# Patient Record
Sex: Female | Born: 1935 | Race: White | Hispanic: No | Marital: Married | Smoking: Former smoker
Health system: Southern US, Community
[De-identification: ages and names within clinical notes are randomized; demographics above are authoritative.]

## PROBLEM LIST (undated history)

## (undated) DIAGNOSIS — E785 Hyperlipidemia, unspecified: Secondary | ICD-10-CM

## (undated) DIAGNOSIS — J449 Chronic obstructive pulmonary disease, unspecified: Secondary | ICD-10-CM

## (undated) DIAGNOSIS — F329 Major depressive disorder, single episode, unspecified: Secondary | ICD-10-CM

## (undated) DIAGNOSIS — F32A Depression, unspecified: Secondary | ICD-10-CM

## (undated) DIAGNOSIS — D126 Benign neoplasm of colon, unspecified: Secondary | ICD-10-CM

## (undated) DIAGNOSIS — J189 Pneumonia, unspecified organism: Secondary | ICD-10-CM

## (undated) DIAGNOSIS — K5790 Diverticulosis of intestine, part unspecified, without perforation or abscess without bleeding: Secondary | ICD-10-CM

## (undated) DIAGNOSIS — K589 Irritable bowel syndrome without diarrhea: Secondary | ICD-10-CM

## (undated) DIAGNOSIS — M810 Age-related osteoporosis without current pathological fracture: Secondary | ICD-10-CM

## (undated) DIAGNOSIS — N200 Calculus of kidney: Secondary | ICD-10-CM

## (undated) DIAGNOSIS — I10 Essential (primary) hypertension: Secondary | ICD-10-CM

## (undated) DIAGNOSIS — K449 Diaphragmatic hernia without obstruction or gangrene: Secondary | ICD-10-CM

## (undated) DIAGNOSIS — K219 Gastro-esophageal reflux disease without esophagitis: Secondary | ICD-10-CM

## (undated) DIAGNOSIS — F419 Anxiety disorder, unspecified: Secondary | ICD-10-CM

## (undated) DIAGNOSIS — C3490 Malignant neoplasm of unspecified part of unspecified bronchus or lung: Secondary | ICD-10-CM

## (undated) DIAGNOSIS — M1990 Unspecified osteoarthritis, unspecified site: Secondary | ICD-10-CM

## (undated) DIAGNOSIS — I350 Nonrheumatic aortic (valve) stenosis: Secondary | ICD-10-CM

## (undated) DIAGNOSIS — J439 Emphysema, unspecified: Secondary | ICD-10-CM

## (undated) DIAGNOSIS — R011 Cardiac murmur, unspecified: Secondary | ICD-10-CM

## (undated) DIAGNOSIS — N189 Chronic kidney disease, unspecified: Secondary | ICD-10-CM

## (undated) HISTORY — DX: Irritable bowel syndrome, unspecified: K58.9

## (undated) HISTORY — DX: Cardiac murmur, unspecified: R01.1

## (undated) HISTORY — PX: CARDIAC CATHETERIZATION: SHX172

## (undated) HISTORY — PX: ABDOMINAL HYSTERECTOMY: SHX81

## (undated) HISTORY — PX: NOSE SURGERY: SHX723

## (undated) HISTORY — DX: Chronic obstructive pulmonary disease, unspecified: J44.9

## (undated) HISTORY — DX: Nonrheumatic aortic (valve) stenosis: I35.0

## (undated) HISTORY — DX: Hyperlipidemia, unspecified: E78.5

## (undated) HISTORY — PX: CATARACT EXTRACTION: SUR2

## (undated) HISTORY — DX: Age-related osteoporosis without current pathological fracture: M81.0

## (undated) HISTORY — PX: FACIAL COSMETIC SURGERY: SHX629

## (undated) HISTORY — PX: CARDIAC VALVE REPLACEMENT: SHX585

## (undated) HISTORY — DX: Diaphragmatic hernia without obstruction or gangrene: K44.9

## (undated) HISTORY — DX: Diverticulosis of intestine, part unspecified, without perforation or abscess without bleeding: K57.90

## (undated) HISTORY — DX: Chronic kidney disease, unspecified: N18.9

## (undated) HISTORY — DX: Calculus of kidney: N20.0

## (undated) HISTORY — DX: Emphysema, unspecified: J43.9

## (undated) HISTORY — PX: CORONARY ANGIOPLASTY: SHX604

## (undated) HISTORY — DX: Malignant neoplasm of unspecified part of unspecified bronchus or lung: C34.90

---
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
## 1990-09-18 DIAGNOSIS — D126 Benign neoplasm of colon, unspecified: Secondary | ICD-10-CM

## 1990-09-18 HISTORY — DX: Benign neoplasm of colon, unspecified: D12.6

## 1996-09-18 HISTORY — PX: ANKLE SURGERY: SHX546

## 1999-07-20 ENCOUNTER — Other Ambulatory Visit: Admission: RE | Admit: 1999-07-20 | Discharge: 1999-07-20 | Payer: Self-pay | Admitting: Family Medicine

## 1999-08-08 ENCOUNTER — Ambulatory Visit (HOSPITAL_COMMUNITY): Admission: RE | Admit: 1999-08-08 | Discharge: 1999-08-08 | Payer: Self-pay | Admitting: Ophthalmology

## 1999-12-02 ENCOUNTER — Emergency Department (HOSPITAL_COMMUNITY): Admission: EM | Admit: 1999-12-02 | Discharge: 1999-12-02 | Payer: Self-pay | Admitting: Emergency Medicine

## 1999-12-03 ENCOUNTER — Encounter: Payer: Self-pay | Admitting: Emergency Medicine

## 2000-09-28 ENCOUNTER — Other Ambulatory Visit: Admission: RE | Admit: 2000-09-28 | Discharge: 2000-09-28 | Payer: Self-pay | Admitting: Family Medicine

## 2001-01-08 ENCOUNTER — Encounter: Admission: RE | Admit: 2001-01-08 | Discharge: 2001-04-08 | Payer: Self-pay | Admitting: Family Medicine

## 2001-12-31 ENCOUNTER — Other Ambulatory Visit: Admission: RE | Admit: 2001-12-31 | Discharge: 2001-12-31 | Payer: Self-pay | Admitting: Family Medicine

## 2003-05-05 ENCOUNTER — Other Ambulatory Visit: Admission: RE | Admit: 2003-05-05 | Discharge: 2003-05-05 | Payer: Self-pay | Admitting: Family Medicine

## 2004-01-24 ENCOUNTER — Emergency Department (HOSPITAL_COMMUNITY): Admission: EM | Admit: 2004-01-24 | Discharge: 2004-01-24 | Payer: Self-pay | Admitting: Family Medicine

## 2004-05-11 ENCOUNTER — Other Ambulatory Visit: Admission: RE | Admit: 2004-05-11 | Discharge: 2004-05-11 | Payer: Self-pay | Admitting: Family Medicine

## 2005-05-12 ENCOUNTER — Other Ambulatory Visit: Admission: RE | Admit: 2005-05-12 | Discharge: 2005-05-12 | Payer: Self-pay | Admitting: Family Medicine

## 2006-05-14 ENCOUNTER — Other Ambulatory Visit: Admission: RE | Admit: 2006-05-14 | Discharge: 2006-05-14 | Payer: Self-pay | Admitting: Family Medicine

## 2006-08-07 ENCOUNTER — Ambulatory Visit: Payer: Self-pay | Admitting: Gastroenterology

## 2006-08-20 ENCOUNTER — Ambulatory Visit: Payer: Self-pay | Admitting: Gastroenterology

## 2008-08-20 ENCOUNTER — Other Ambulatory Visit: Admission: RE | Admit: 2008-08-20 | Discharge: 2008-08-20 | Payer: Self-pay | Admitting: Family Medicine

## 2011-06-13 ENCOUNTER — Other Ambulatory Visit: Payer: Self-pay

## 2011-06-13 ENCOUNTER — Encounter (HOSPITAL_COMMUNITY)
Admission: RE | Admit: 2011-06-13 | Discharge: 2011-06-13 | Disposition: A | Discharge status: Home / Self Care | Payer: Medicare Other | Source: Ambulatory Visit | Attending: Obstetrics & Gynecology | Admitting: Obstetrics & Gynecology

## 2011-06-13 ENCOUNTER — Encounter (HOSPITAL_COMMUNITY): Payer: Self-pay

## 2011-06-13 HISTORY — DX: Essential (primary) hypertension: I10

## 2011-06-13 HISTORY — DX: Anxiety disorder, unspecified: F41.9

## 2011-06-13 HISTORY — DX: Unspecified osteoarthritis, unspecified site: M19.90

## 2011-06-13 HISTORY — DX: Gastro-esophageal reflux disease without esophagitis: K21.9

## 2011-06-13 HISTORY — DX: Depression, unspecified: F32.A

## 2011-06-13 HISTORY — DX: Major depressive disorder, single episode, unspecified: F32.9

## 2011-06-13 LAB — BASIC METABOLIC PANEL
Calcium: 10.1 mg/dL (ref 8.4–10.5)
GFR calc Af Amer: 47 mL/min — ABNORMAL LOW (ref >60)
GFR calc non Af Amer: 38 mL/min — ABNORMAL LOW (ref >60)
Glucose, Bld: 147 mg/dL — ABNORMAL HIGH (ref 70–99)
Sodium: 136 mEq/L (ref 135–145)

## 2011-06-13 LAB — CBC
MCH: 27.2 pg (ref 26.0–34.0)
RBC: 4.52 MIL/uL (ref 3.87–5.11)

## 2011-06-13 LAB — SURGICAL PCR SCREEN: Staphylococcus aureus: NEGATIVE

## 2011-06-13 NOTE — Patient Instructions (Signed)
   Your procedure is scheduled on:Wed, 06/21/11  Enter through the Main Entrance of [facility] at:6am Pick up the phone at the desk and dial [phone] and inform us of your arrival  Please call this number if you have any problems the morning of surgery: [phone]  Remember: Do not eat food after midnight  Do not drink clear liquids after:midnite Take these medicines the morning of surgery with a SIP OF WATER: BP meds Do not take Metformin the night before  Do not wear jewelry, make-up, or FINGER nail polish Do not wear lotions, powders, or perfumes.  You may wear deodorant. Do not shave 48 hours prior to surgery. Do not bring valuables to the hospital. Leave suitcase in the car. After Surgery it may be brought to your room. For patients being admitted to the hospital, checkout time is 11:00am the day of discharge.  Patients discharged on the day of surgery will not be allowed to drive home.   Name and phone number of your driver:[name]- [phone]   Remember to use your hibiclens as instructed.Please shower with 1/2 bottle the evening before your surgery and the other 1/2 bottle the morning of surgery.

## 2011-06-21 ENCOUNTER — Encounter (HOSPITAL_COMMUNITY): Payer: Self-pay | Admitting: Anesthesiology

## 2011-06-21 ENCOUNTER — Encounter (HOSPITAL_COMMUNITY): Payer: Self-pay | Admitting: *Deleted

## 2011-06-21 ENCOUNTER — Inpatient Hospital Stay (HOSPITAL_COMMUNITY)
Admission: RE | Admit: 2011-06-21 | Discharge: 2011-06-23 | DRG: 743 | Disposition: A | Discharge status: Home / Self Care | Payer: Medicare Other | Source: Ambulatory Visit | Attending: Obstetrics & Gynecology | Admitting: Obstetrics & Gynecology

## 2011-06-21 ENCOUNTER — Encounter (HOSPITAL_COMMUNITY)
Admission: RE | Disposition: A | Discharge status: Home / Self Care | Payer: Self-pay | Source: Ambulatory Visit | Attending: Obstetrics & Gynecology

## 2011-06-21 ENCOUNTER — Other Ambulatory Visit: Payer: Self-pay | Admitting: Obstetrics & Gynecology

## 2011-06-21 ENCOUNTER — Inpatient Hospital Stay (HOSPITAL_COMMUNITY): Payer: Medicare Other | Admitting: Anesthesiology

## 2011-06-21 DIAGNOSIS — E119 Type 2 diabetes mellitus without complications: Secondary | ICD-10-CM | POA: Diagnosis present

## 2011-06-21 DIAGNOSIS — Z01812 Encounter for preprocedural laboratory examination: Secondary | ICD-10-CM

## 2011-06-21 DIAGNOSIS — N812 Incomplete uterovaginal prolapse: Principal | ICD-10-CM | POA: Diagnosis present

## 2011-06-21 DIAGNOSIS — I10 Essential (primary) hypertension: Secondary | ICD-10-CM | POA: Diagnosis present

## 2011-06-21 DIAGNOSIS — Z90710 Acquired absence of both cervix and uterus: Secondary | ICD-10-CM

## 2011-06-21 DIAGNOSIS — E7800 Pure hypercholesterolemia, unspecified: Secondary | ICD-10-CM | POA: Diagnosis present

## 2011-06-21 DIAGNOSIS — N8000 Endometriosis of the uterus, unspecified: Secondary | ICD-10-CM | POA: Diagnosis present

## 2011-06-21 DIAGNOSIS — N838 Other noninflammatory disorders of ovary, fallopian tube and broad ligament: Secondary | ICD-10-CM | POA: Diagnosis present

## 2011-06-21 DIAGNOSIS — J438 Other emphysema: Secondary | ICD-10-CM | POA: Diagnosis present

## 2011-06-21 DIAGNOSIS — Z01818 Encounter for other preprocedural examination: Secondary | ICD-10-CM

## 2011-06-21 HISTORY — PX: ANTERIOR AND POSTERIOR REPAIR: SHX5121

## 2011-06-21 HISTORY — PX: LAPAROSCOPIC ASSISTED VAGINAL HYSTERECTOMY: SHX5398

## 2011-06-21 HISTORY — PX: VAGINAL PROLAPSE REPAIR: SHX830

## 2011-06-21 HISTORY — PX: SALPINGOOPHORECTOMY: SHX82

## 2011-06-21 LAB — GLUCOSE, CAPILLARY

## 2011-06-21 LAB — COMPREHENSIVE METABOLIC PANEL
ALT: 17 U/L (ref 0–35)
AST: 20 U/L (ref 0–37)
Alkaline Phosphatase: 70 U/L (ref 39–117)
CO2: 26 mEq/L (ref 19–32)
Calcium: 9.7 mg/dL (ref 8.4–10.5)
Chloride: 101 mEq/L (ref 96–112)
GFR calc Af Amer: 44 mL/min — ABNORMAL LOW (ref >90)
GFR calc non Af Amer: 38 mL/min — ABNORMAL LOW (ref >90)
Glucose, Bld: 157 mg/dL — ABNORMAL HIGH (ref 70–99)
Sodium: 137 mEq/L (ref 135–145)
Total Bilirubin: 0.3 mg/dL (ref 0.3–1.2)

## 2011-06-21 SURGERY — HYSTERECTOMY, VAGINAL, LAPAROSCOPY-ASSISTED
Anesthesia: General | Site: Vagina | Wound class: Contaminated

## 2011-06-21 MED ORDER — ZOLPIDEM TARTRATE 5 MG PO TABS: 5.0000 mg | ORAL_TABLET | Freq: Every evening | ORAL | Status: DC | PRN

## 2011-06-21 MED ORDER — ROCURONIUM BROMIDE 50 MG/5ML IV SOLN
INTRAVENOUS | Status: AC
  Filled 2011-06-21: qty 1

## 2011-06-21 MED ORDER — DOCUSATE SODIUM 100 MG PO CAPS
100.0000 mg | ORAL_CAPSULE | Freq: Every day | ORAL | Status: DC
  Administered 2011-06-22: 100 mg via ORAL
  Filled 2011-06-21: qty 1

## 2011-06-21 MED ORDER — LIDOCAINE HCL (CARDIAC) 20 MG/ML IV SOLN
INTRAVENOUS | Status: DC | PRN
  Administered 2011-06-21: 60 mg via INTRAVENOUS

## 2011-06-21 MED ORDER — SODIUM CHLORIDE 0.9 % IJ SOLN: 9.0000 mL | INTRAMUSCULAR | Status: DC | PRN

## 2011-06-21 MED ORDER — MAGNESIUM CITRATE PO SOLN
296.0000 mL | Freq: Every day | ORAL | Status: DC | PRN
  Filled 2011-06-21: qty 296

## 2011-06-21 MED ORDER — HYDROMORPHONE HCL 1 MG/ML IJ SOLN: 0.2000 mg | INTRAMUSCULAR | Status: DC | PRN

## 2011-06-21 MED ORDER — NEOSTIGMINE METHYLSULFATE 1 MG/ML IJ SOLN
INTRAMUSCULAR | Status: AC
  Filled 2011-06-21: qty 10

## 2011-06-21 MED ORDER — ONDANSETRON HCL 4 MG/2ML IJ SOLN
4.0000 mg | Freq: Four times a day (QID) | INTRAMUSCULAR | Status: DC | PRN
  Administered 2011-06-22: 4 mg via INTRAVENOUS

## 2011-06-21 MED ORDER — PANTOPRAZOLE SODIUM 40 MG PO TBEC
40.0000 mg | DELAYED_RELEASE_TABLET | Freq: Every day | ORAL | Status: DC
  Administered 2011-06-21 – 2011-06-22 (×2): 40 mg via ORAL
  Filled 2011-06-21 (×4): qty 1

## 2011-06-21 MED ORDER — CEFAZOLIN SODIUM 1-5 GM-% IV SOLN
1.0000 g | INTRAVENOUS | Status: AC
  Administered 2011-06-21: 1 g via INTRAVENOUS

## 2011-06-21 MED ORDER — ONDANSETRON HCL 4 MG/2ML IJ SOLN
4.0000 mg | Freq: Four times a day (QID) | INTRAMUSCULAR | Status: DC | PRN
  Filled 2011-06-21: qty 2

## 2011-06-21 MED ORDER — BISACODYL 10 MG RE SUPP: 10.0000 mg | Freq: Every day | RECTAL | Status: DC | PRN

## 2011-06-21 MED ORDER — SENNOSIDES-DOCUSATE SODIUM 8.6-50 MG PO TABS: 2.0000 | ORAL_TABLET | Freq: Every day | ORAL | Status: DC | PRN

## 2011-06-21 MED ORDER — GLYCOPYRROLATE 0.2 MG/ML IJ SOLN
INTRAMUSCULAR | Status: AC
  Filled 2011-06-21: qty 2

## 2011-06-21 MED ORDER — SIMVASTATIN 20 MG PO TABS
20.0000 mg | ORAL_TABLET | Freq: Every day | ORAL | Status: DC
  Administered 2011-06-21 – 2011-06-22 (×2): 20 mg via ORAL
  Filled 2011-06-21 (×3): qty 1

## 2011-06-21 MED ORDER — FUROSEMIDE 10 MG/ML IJ SOLN
INTRAMUSCULAR | Status: AC
  Administered 2011-06-21: 10 mg via INTRAVENOUS
  Filled 2011-06-21: qty 2

## 2011-06-21 MED ORDER — OLMESARTAN MEDOXOMIL 40 MG PO TABS
40.0000 mg | ORAL_TABLET | Freq: Every day | ORAL | Status: DC
  Administered 2011-06-22 – 2011-06-23 (×2): 40 mg via ORAL
  Filled 2011-06-21 (×3): qty 1

## 2011-06-21 MED ORDER — PANTOPRAZOLE SODIUM 40 MG PO TBEC
DELAYED_RELEASE_TABLET | ORAL | Status: AC
  Filled 2011-06-21: qty 1

## 2011-06-21 MED ORDER — SERTRALINE HCL 50 MG PO TABS
50.0000 mg | ORAL_TABLET | Freq: Every day | ORAL | Status: DC
  Administered 2011-06-22 – 2011-06-23 (×2): 50 mg via ORAL
  Filled 2011-06-21 (×4): qty 1

## 2011-06-21 MED ORDER — ALUM & MAG HYDROXIDE-SIMETH 200-200-20 MG/5ML PO SUSP: 30.0000 mL | ORAL | Status: DC | PRN

## 2011-06-21 MED ORDER — FENTANYL CITRATE 0.05 MG/ML IJ SOLN
INTRAMUSCULAR | Status: AC
  Filled 2011-06-21: qty 5

## 2011-06-21 MED ORDER — CEFAZOLIN SODIUM 1-5 GM-% IV SOLN
INTRAVENOUS | Status: AC
  Filled 2011-06-21: qty 50

## 2011-06-21 MED ORDER — MIDAZOLAM HCL 5 MG/5ML IJ SOLN
INTRAMUSCULAR | Status: DC | PRN
  Administered 2011-06-21: 2 mg via INTRAVENOUS

## 2011-06-21 MED ORDER — GLYCOPYRROLATE 0.2 MG/ML IJ SOLN
INTRAMUSCULAR | Status: DC | PRN
  Administered 2011-06-21: .4 mg via INTRAVENOUS

## 2011-06-21 MED ORDER — BUPIVACAINE HCL (PF) 0.25 % IJ SOLN
INTRAMUSCULAR | Status: DC | PRN
  Administered 2011-06-21: 20 mL
  Administered 2011-06-21: 10 mL

## 2011-06-21 MED ORDER — ROCURONIUM BROMIDE 100 MG/10ML IV SOLN
INTRAVENOUS | Status: DC | PRN
  Administered 2011-06-21: 30 mg via INTRAVENOUS

## 2011-06-21 MED ORDER — PROPOFOL 10 MG/ML IV EMUL
INTRAVENOUS | Status: AC
  Filled 2011-06-21: qty 20

## 2011-06-21 MED ORDER — MAGNESIUM HYDROXIDE 400 MG/5ML PO SUSP: 30.0000 mL | Freq: Every day | ORAL | Status: DC | PRN

## 2011-06-21 MED ORDER — AMLODIPINE BESYLATE 5 MG PO TABS
5.0000 mg | ORAL_TABLET | Freq: Every day | ORAL | Status: DC
  Administered 2011-06-22 – 2011-06-23 (×2): 5 mg via ORAL
  Filled 2011-06-21 (×4): qty 1

## 2011-06-21 MED ORDER — MIDAZOLAM HCL 2 MG/2ML IJ SOLN
INTRAMUSCULAR | Status: AC
  Filled 2011-06-21: qty 2

## 2011-06-21 MED ORDER — SODIUM CHLORIDE 0.45 % IV SOLN
INTRAVENOUS | Status: DC
  Administered 2011-06-21 – 2011-06-22 (×3): via INTRAVENOUS

## 2011-06-21 MED ORDER — MORPHINE SULFATE (PF) 1 MG/ML IV SOLN
INTRAVENOUS | Status: DC
  Filled 2011-06-21: qty 25

## 2011-06-21 MED ORDER — METHOCARBAMOL 500 MG PO TABS
500.0000 mg | ORAL_TABLET | Freq: Three times a day (TID) | ORAL | Status: DC | PRN
  Administered 2011-06-21: 500 mg via ORAL
  Filled 2011-06-21: qty 1

## 2011-06-21 MED ORDER — NALOXONE HCL 0.4 MG/ML IJ SOLN: 0.4000 mg | INTRAMUSCULAR | Status: DC | PRN

## 2011-06-21 MED ORDER — INDIGOTINDISULFONATE SODIUM 8 MG/ML IJ SOLN
INTRAMUSCULAR | Status: DC | PRN
  Administered 2011-06-21: 5 mL via INTRAVENOUS

## 2011-06-21 MED ORDER — DEXAMETHASONE SODIUM PHOSPHATE 10 MG/ML IJ SOLN
INTRAMUSCULAR | Status: DC | PRN
  Administered 2011-06-21: 10 mg via INTRAVENOUS

## 2011-06-21 MED ORDER — IBUPROFEN 600 MG PO TABS
600.0000 mg | ORAL_TABLET | Freq: Four times a day (QID) | ORAL | Status: DC | PRN
  Administered 2011-06-21 – 2011-06-22 (×2): 600 mg via ORAL
  Filled 2011-06-21 (×2): qty 1

## 2011-06-21 MED ORDER — THERA M PLUS PO TABS
1.0000 | ORAL_TABLET | Freq: Every day | ORAL | Status: DC
  Administered 2011-06-22 – 2011-06-23 (×2): 1 via ORAL
  Filled 2011-06-21 (×4): qty 1

## 2011-06-21 MED ORDER — NEOSTIGMINE METHYLSULFATE 1 MG/ML IJ SOLN
INTRAMUSCULAR | Status: DC | PRN
  Administered 2011-06-21: 2 mg via INTRAVENOUS

## 2011-06-21 MED ORDER — LACTATED RINGERS IR SOLN
Status: DC | PRN
  Administered 2011-06-21: 3000 mL

## 2011-06-21 MED ORDER — ONDANSETRON HCL 4 MG PO TABS: 4.0000 mg | ORAL_TABLET | Freq: Four times a day (QID) | ORAL | Status: DC | PRN

## 2011-06-21 MED ORDER — HYDROMORPHONE HCL 1 MG/ML IJ SOLN
INTRAMUSCULAR | Status: AC
  Administered 2011-06-21: 0.5 mg via INTRAVENOUS
  Filled 2011-06-21: qty 1

## 2011-06-21 MED ORDER — ONDANSETRON HCL 4 MG/2ML IJ SOLN
INTRAMUSCULAR | Status: AC
  Filled 2011-06-21: qty 2

## 2011-06-21 MED ORDER — DEXAMETHASONE SODIUM PHOSPHATE 10 MG/ML IJ SOLN
INTRAMUSCULAR | Status: AC
  Filled 2011-06-21: qty 1

## 2011-06-21 MED ORDER — METFORMIN HCL ER 500 MG PO TB24
1000.0000 mg | ORAL_TABLET | ORAL | Status: DC
  Administered 2011-06-21: 1000 mg via ORAL
  Filled 2011-06-21: qty 2

## 2011-06-21 MED ORDER — HYDROMORPHONE HCL 1 MG/ML IJ SOLN
0.2500 mg | INTRAMUSCULAR | Status: DC | PRN
  Administered 2011-06-21 (×4): 0.5 mg via INTRAVENOUS

## 2011-06-21 MED ORDER — BISACODYL 5 MG PO TBEC
5.0000 mg | DELAYED_RELEASE_TABLET | Freq: Every day | ORAL | Status: DC | PRN
  Filled 2011-06-21: qty 1

## 2011-06-21 MED ORDER — HYDROCHLOROTHIAZIDE 12.5 MG PO CAPS
12.5000 mg | ORAL_CAPSULE | ORAL | Status: DC
  Administered 2011-06-22 – 2011-06-23 (×2): 12.5 mg via ORAL
  Filled 2011-06-21 (×3): qty 1

## 2011-06-21 MED ORDER — ONDANSETRON HCL 4 MG/2ML IJ SOLN
INTRAMUSCULAR | Status: DC | PRN
  Administered 2011-06-21: 4 mg via INTRAVENOUS

## 2011-06-21 MED ORDER — FUROSEMIDE 10 MG/ML IJ SOLN
10.0000 mg | Freq: Once | INTRAMUSCULAR | Status: AC
  Administered 2011-06-21: 10 mg via INTRAVENOUS

## 2011-06-21 MED ORDER — INDIGOTINDISULFONATE SODIUM 8 MG/ML IJ SOLN
INTRAMUSCULAR | Status: AC
  Filled 2011-06-21: qty 5

## 2011-06-21 MED ORDER — PANTOPRAZOLE SODIUM 40 MG PO TBEC
40.0000 mg | DELAYED_RELEASE_TABLET | Freq: Once | ORAL | Status: AC
  Administered 2011-06-21: 40 mg via ORAL

## 2011-06-21 MED ORDER — FENTANYL CITRATE 0.05 MG/ML IJ SOLN
INTRAMUSCULAR | Status: DC | PRN
  Administered 2011-06-21: 50 ug via INTRAVENOUS
  Administered 2011-06-21 (×2): 100 ug via INTRAVENOUS

## 2011-06-21 MED ORDER — DIPHENHYDRAMINE HCL 50 MG/ML IJ SOLN: 12.5000 mg | Freq: Four times a day (QID) | INTRAMUSCULAR | Status: DC | PRN

## 2011-06-21 MED ORDER — SIMETHICONE 80 MG PO CHEW: 80.0000 mg | CHEWABLE_TABLET | Freq: Four times a day (QID) | ORAL | Status: DC | PRN

## 2011-06-21 MED ORDER — PROPOFOL 10 MG/ML IV EMUL
INTRAVENOUS | Status: DC | PRN
  Administered 2011-06-21: 50 mg via INTRAVENOUS
  Administered 2011-06-21: 110 mg via INTRAVENOUS

## 2011-06-21 MED ORDER — PHENAZOPYRIDINE HCL 200 MG PO TABS: 200.0000 mg | ORAL_TABLET | Freq: Three times a day (TID) | ORAL | Status: DC

## 2011-06-21 MED ORDER — DIPHENHYDRAMINE HCL 12.5 MG/5ML PO ELIX
12.5000 mg | ORAL_SOLUTION | Freq: Four times a day (QID) | ORAL | Status: DC | PRN
  Filled 2011-06-21: qty 5

## 2011-06-21 MED ORDER — FUROSEMIDE 10 MG/ML IJ SOLN
INTRAMUSCULAR | Status: AC
  Filled 2011-06-21: qty 2

## 2011-06-21 MED ORDER — LIDOCAINE HCL (CARDIAC) 20 MG/ML IV SOLN
INTRAVENOUS | Status: AC
  Filled 2011-06-21: qty 5

## 2011-06-21 MED ORDER — OXYCODONE-ACETAMINOPHEN 5-325 MG PO TABS
1.0000 | ORAL_TABLET | ORAL | Status: DC | PRN
  Administered 2011-06-21 – 2011-06-22 (×4): 1 via ORAL
  Filled 2011-06-21 (×4): qty 1

## 2011-06-21 MED ORDER — LACTATED RINGERS IV SOLN
INTRAVENOUS | Status: DC
  Administered 2011-06-21 (×3): via INTRAVENOUS
  Administered 2011-06-21: 1000 mL via INTRAVENOUS

## 2011-06-21 SURGICAL SUPPLY — 56 items
BLADE SURG 15 STRL LF C SS BP (BLADE) ×3 IMPLANT
BLADE SURG 15 STRL SS (BLADE) ×4
CABLE HIGH FREQUENCY MONO STRZ (ELECTRODE) IMPLANT
CANISTER SUCTION 2500CC (MISCELLANEOUS) ×4 IMPLANT
CATH BONANNO SUPRAPUBIC 14G (CATHETERS) ×1 IMPLANT
CATH ROBINSON RED A/P 16FR (CATHETERS) ×4 IMPLANT
CLOTH BEACON ORANGE TIMEOUT ST (SAFETY) ×4 IMPLANT
CONT PATH 16OZ SNAP LID 3702 (MISCELLANEOUS) ×4 IMPLANT
COVER TABLE BACK 60X90 (DRAPES) ×4 IMPLANT
DECANTER SPIKE VIAL GLASS SM (MISCELLANEOUS) ×1 IMPLANT
DEVICE CAPIO SUTURING (INSTRUMENTS) ×1
DEVICE CAPIO SUTURING OPC (INSTRUMENTS) IMPLANT
DRAPE UTILITY XL STRL (DRAPES) ×4 IMPLANT
DRESSING COVERLET 3X1 FLEXIBLE (GAUZE/BANDAGES/DRESSINGS) ×1 IMPLANT
ELECT LIGASURE LONG (ELECTRODE) ×1 IMPLANT
ELECT REM PT RETURN 9FT ADLT (ELECTROSURGICAL) ×4
ELECTRODE REM PT RTRN 9FT ADLT (ELECTROSURGICAL) ×3 IMPLANT
GAUZE PACKING IODOFORM 2 (PACKING) IMPLANT
GLOVE BIO SURGEON STRL SZ7.5 (GLOVE) ×12 IMPLANT
GLOVE BIOGEL PI IND STRL 6.5 (GLOVE) ×3 IMPLANT
GLOVE BIOGEL PI INDICATOR 6.5 (GLOVE) ×1
GOWN PREVENTION PLUS XLARGE (GOWN DISPOSABLE) ×4 IMPLANT
NDL INSUFFLATION 14GA 120MM (NEEDLE) ×3 IMPLANT
NDL SPNL 22GX3.5 QUINCKE BK (NEEDLE) IMPLANT
NEEDLE HYPO 22GX1.5 SAFETY (NEEDLE) ×1 IMPLANT
NEEDLE INSUFFLATION 14GA 120MM (NEEDLE) ×4 IMPLANT
NEEDLE MAYO .5 CIRCLE (NEEDLE) ×1 IMPLANT
NEEDLE SPNL 22GX3.5 QUINCKE BK (NEEDLE) ×4 IMPLANT
NS IRRIG 1000ML POUR BTL (IV SOLUTION) ×4 IMPLANT
PACK LAVH (CUSTOM PROCEDURE TRAY) ×4 IMPLANT
PACK VAGINAL WOMENS (CUSTOM PROCEDURE TRAY) ×4 IMPLANT
SEALER TISSUE G2 CVD JAW 35 (ENDOMECHANICALS) IMPLANT
SEALER TISSUE G2 CVD JAW 45CM (ENDOMECHANICALS) ×4 IMPLANT
SET IRRIG TUBING LAPAROSCOPIC (IRRIGATION / IRRIGATOR) ×1 IMPLANT
SPONGE GAUZE 4X4 12PLY (GAUZE/BANDAGES/DRESSINGS) ×1 IMPLANT
STRIP CLOSURE SKIN 1/4X3 (GAUZE/BANDAGES/DRESSINGS) ×5 IMPLANT
STRIP CLOSURE SKIN 1/4X4 (GAUZE/BANDAGES/DRESSINGS) ×1 IMPLANT
SUT CAPIO POLYGLYCOLIC (SUTURE) ×2 IMPLANT
SUT CHROMIC 2 0 SH (SUTURE) ×1 IMPLANT
SUT MNCRL 0 MO-4 VIOLET 18 CR (SUTURE) ×9 IMPLANT
SUT MNCRL 0 VIOLET 6X18 (SUTURE) ×3 IMPLANT
SUT MON AB 2-0 CT1 27 (SUTURE) ×8 IMPLANT
SUT MON AB 2-0 SH 27 (SUTURE)
SUT MON AB 2-0 SH27 (SUTURE) IMPLANT
SUT MONOCRYL 0 6X18 (SUTURE) ×1
SUT MONOCRYL 0 MO 4 18  CR/8 (SUTURE) ×3
SUT SILK 2 0 FSL 18 (SUTURE) ×1 IMPLANT
SUT VIC AB 3-0 PS2 18 (SUTURE) ×4
SUT VIC AB 3-0 PS2 18XBRD (SUTURE) ×3 IMPLANT
TAPE CLOTH SURG 4X10 WHT LF (GAUZE/BANDAGES/DRESSINGS) ×1 IMPLANT
TOWEL OR 17X24 6PK STRL BLUE (TOWEL DISPOSABLE) ×8 IMPLANT
TRAY FOLEY CATH 14FR (SET/KITS/TRAYS/PACK) ×4 IMPLANT
TROCAR Z-THREAD BLADED 11X100M (TROCAR) ×4 IMPLANT
TROCAR Z-THREAD BLADED 5X100MM (TROCAR) ×4 IMPLANT
WARMER LAPAROSCOPE (MISCELLANEOUS) ×3 IMPLANT
WATER STERILE IRR 1000ML POUR (IV SOLUTION) ×4 IMPLANT

## 2011-06-21 NOTE — Anesthesia Postprocedure Evaluation (Signed)
  Anesthesia Post-op Note  Patient: [name]  Procedure(s) Performed:  LAPAROSCOPIC ASSISTED VAGINAL HYSTERECTOMY; SALPINGO OOPHERECTOMY; ANTERIOR (CYSTOCELE) AND POSTERIOR REPAIR (RECTOCELE); SACROPEXY - sacrospinous ligament suspension Patient is awake and responsive, she has no complaints. Pain and nausea are reasonably well controlled. Vital signs are stable and clinically acceptable ,rales are still present on Right base. Dr Jennette Kettle aware patient has received Lasix. She has had 350cc u/o in PACU. Oxygen saturation is clinically acceptable; d/c with O2 There are no apparent anesthetic complications at this time. Patient is ready for discharge.

## 2011-06-21 NOTE — Anesthesia Postprocedure Evaluation (Signed)
  Anesthesia Post-op Note  Patient: [name]  Procedure(s) Performed:  LAPAROSCOPIC ASSISTED VAGINAL HYSTERECTOMY; SALPINGO OOPHERECTOMY; ANTERIOR (CYSTOCELE) AND POSTERIOR REPAIR (RECTOCELE); SACROPEXY - sacrospinous ligament suspension  Patient is awake and responsive. Pain and nausea are reasonably well controlled. Vital signs are stable and clinically acceptable. Oxygen saturation is clinically acceptable. There are no apparent anesthetic complications at this time. Patient is ready for discharge.

## 2011-06-21 NOTE — Progress Notes (Signed)
CTSP re: SP catheter  Pt unable to void, feels need to void Cath will not drain Will not flush SP cath removed intact Foley cath placed

## 2011-06-21 NOTE — Transfer of Care (Signed)
Immediate Anesthesia Transfer of Care Note  Patient: [name]  Procedure(s) Performed:  LAPAROSCOPIC ASSISTED VAGINAL HYSTERECTOMY; SALPINGO OOPHERECTOMY; ANTERIOR (CYSTOCELE) AND POSTERIOR REPAIR (RECTOCELE); SACROPEXY - sacrospinous ligament suspension  Patient Location: PACU  Anesthesia Type: General  Level of Consciousness: awake, alert  and oriented  Airway & Oxygen Therapy: Patient Spontanous Breathing and Patient connected to face mask oxygen  Post-op Assessment: Report given to PACU RN and Post -op Vital signs reviewed and stable  Post vital signs: stable  Complications: No apparent anesthesia complications

## 2011-06-21 NOTE — Progress Notes (Signed)
Encounter addended by: Madison Hickman on: 06/21/2011  6:17 PM<BR>     Documentation filed: Notes Section

## 2011-06-21 NOTE — OR Nursing (Signed)
Procedure #4 Sacrospinous Ligament Suspension

## 2011-06-21 NOTE — Addendum Note (Signed)
Addendum  created 06/21/11 1047 by Jiles Garter   Modules edited:Anesthesia Events, Orders

## 2011-06-21 NOTE — Addendum Note (Signed)
Addendum  created 06/21/11 1047 by Jiles Garter   Modules edited:Anesthesia Events

## 2011-06-21 NOTE — Addendum Note (Signed)
Addendum  created 06/21/11 1208 by Jiles Garter   Modules edited:Notes Section

## 2011-06-21 NOTE — Anesthesia Preprocedure Evaluation (Signed)
Anesthesia Evaluation  Name, MR# and DOB Patient awake  General Assessment Comment  Reviewed: Allergy & Precautions, H&P , NPO status , Patient's Chart, lab work & pertinent test results  History of Anesthesia Complications (+) Emergence Delirium  Airway Mallampati: II TM Distance: >3 FB Neck ROM: Full    Dental No notable dental hx. (+) Lower Dentures and Upper Dentures   Pulmonary  clear to auscultation  Pulmonary exam normal       Cardiovascular hypertension, Pt. on medications Regular Normal    Neuro/Psych Negative Neurological ROS     GI/Hepatic Neg liver ROS  GERD Medicated and Controlled  Endo/Other    Renal/GU negative Renal ROS  Genitourinary negative   Musculoskeletal negative musculoskeletal ROS (+)   Abdominal Normal abdominal exam  (+)   Peds  Hematology negative hematology ROS (+)   Anesthesia Other Findings   Reproductive/Obstetrics negative OB ROS                           Anesthesia Physical Anesthesia Plan  ASA: II  Anesthesia Plan: General   Post-op Pain Management:    Induction: Intravenous  Airway Management Planned: Oral ETT  Additional Equipment:   Intra-op Plan:   Post-operative Plan: Extubation in OR  Informed Consent: I have reviewed the patients History and Physical, chart, labs and discussed the procedure including the risks, benefits and alternatives for the proposed anesthesia with the patient or authorized representative who has indicated his/her understanding and acceptance.   Dental advisory given  Plan Discussed with: Anesthesiologist and CRNA  Anesthesia Plan Comments:         Anesthesia Quick Evaluation

## 2011-06-21 NOTE — Op Note (Signed)
Pelvic relaxation and prolapse Procedure:  LAVH/BSO/A&P repair/SSLS Surgeon:  Jennette Kettle   AsstRana Snare GET EBL: 100cc complcations none Drains:  Suprapubic  Dictated

## 2011-06-21 NOTE — Anesthesia Procedure Notes (Signed)
Procedures

## 2011-06-21 NOTE — Anesthesia Postprocedure Evaluation (Signed)
  Anesthesia Post-op Note  Patient: [name]  Procedure(s) Performed:  LAPAROSCOPIC ASSISTED VAGINAL HYSTERECTOMY; SALPINGO OOPHERECTOMY; ANTERIOR (CYSTOCELE) AND POSTERIOR REPAIR (RECTOCELE); SACROPEXY - sacrospinous ligament suspension  Patient Location: [facility]  Anesthesia Type: General  Level of Consciousness: awake, alert  and oriented  Airway and Oxygen Therapy: Patient Spontanous Breathing  Post-op Pain: none  Post-op Assessment: Post-op Vital signs reviewed and Patient's Cardiovascular Status Stable  Post-op Vital Signs: Reviewed and stable  Complications: No apparent anesthesia complications

## 2011-06-22 ENCOUNTER — Inpatient Hospital Stay (HOSPITAL_COMMUNITY): Payer: Medicare Other

## 2011-06-22 ENCOUNTER — Encounter (HOSPITAL_COMMUNITY): Payer: Self-pay | Admitting: Obstetrics & Gynecology

## 2011-06-22 LAB — CBC
HCT: 29.5 % — ABNORMAL LOW (ref 36.0–46.0)
Hemoglobin: 9.4 g/dL — ABNORMAL LOW (ref 12.0–15.0)
MCH: 27.8 pg (ref 26.0–34.0)
MCV: 87.3 fL (ref 78.0–100.0)
RBC: 3.38 MIL/uL — ABNORMAL LOW (ref 3.87–5.11)

## 2011-06-22 MED ORDER — IPRATROPIUM BROMIDE 0.02 % IN SOLN
0.5000 mg | Freq: Three times a day (TID) | RESPIRATORY_TRACT | Status: DC
  Administered 2011-06-22: 0.5 mg via RESPIRATORY_TRACT
  Filled 2011-06-22 (×4): qty 2.5

## 2011-06-22 MED ORDER — SODIUM CHLORIDE 0.9 % IV SOLN
3.0000 g | Freq: Four times a day (QID) | INTRAVENOUS | Status: DC
  Administered 2011-06-22 – 2011-06-23 (×4): 3 g via INTRAVENOUS
  Filled 2011-06-22 (×7): qty 3

## 2011-06-22 MED ORDER — ALBUTEROL SULFATE (5 MG/ML) 0.5% IN NEBU
5.0000 mg | INHALATION_SOLUTION | Freq: Once | RESPIRATORY_TRACT | Status: AC
  Administered 2011-06-22: 5 mg via RESPIRATORY_TRACT
  Filled 2011-06-22: qty 20

## 2011-06-22 MED ORDER — METFORMIN HCL ER 500 MG PO TB24: 1000.0000 mg | ORAL_TABLET | ORAL | Status: DC

## 2011-06-22 MED ORDER — IOHEXOL 300 MG/ML  SOLN
73.0000 mL | Freq: Once | INTRAMUSCULAR | Status: AC | PRN
  Administered 2011-06-22: 73 mL via INTRAVENOUS

## 2011-06-22 MED ORDER — IPRATROPIUM BROMIDE 0.02 % IN SOLN
0.5000 mg | Freq: Once | RESPIRATORY_TRACT | Status: AC
  Administered 2011-06-22: 0.5 mg via RESPIRATORY_TRACT
  Filled 2011-06-22: qty 2.5

## 2011-06-22 MED ORDER — FLUTICASONE PROPIONATE HFA 110 MCG/ACT IN AERO
2.0000 | INHALATION_SPRAY | Freq: Two times a day (BID) | RESPIRATORY_TRACT | Status: DC
  Administered 2011-06-22 – 2011-06-23 (×2): 2 via RESPIRATORY_TRACT
  Filled 2011-06-22: qty 12

## 2011-06-22 MED ORDER — ALBUTEROL SULFATE (5 MG/ML) 0.5% IN NEBU
2.5000 mg | INHALATION_SOLUTION | Freq: Three times a day (TID) | RESPIRATORY_TRACT | Status: DC
  Administered 2011-06-22: 2.5 mg via RESPIRATORY_TRACT
  Filled 2011-06-22 (×4): qty 20

## 2011-06-22 NOTE — Progress Notes (Signed)
Pt. On continuous pulse ox. Nurse tech noted saturations had decreased to 79 percent on room air. O2 reapplied at 3.5 liters. Pt. Oxygen saturation returned to 95 percent after a few minutes. Pt. unlabored with regular rate and rhythm. Lung sounds diminished bilaterally. Will continue to monitor pt. Status.---------------------------------------Tonesha Tsou Michail Jewels, RN

## 2011-06-22 NOTE — Progress Notes (Signed)
UR Chart review completed.  

## 2011-06-22 NOTE — H&P (Signed)
NAMEVENIA, [name] NO.:  [id number]  MEDICAL RECORD NO.:  [id number]  LOCATION:  9304                          FACILITY:  [facility]  PHYSICIAN:  Freddy Finner, M.D.   DATE OF BIRTH:  1932/04/03  DATE OF ADMISSION:  06/21/2011 DATE OF DISCHARGE:                             HISTORY & PHYSICAL   HISTORY OF PRESENT ILLNESS:  The patient is a 77 year old white married female gravida 1, para 1 who presented in August 2012 complaining of difficulty with urination, hesitancy, and frequent urinary tract infections.  She was concerned about the possibility of vaginal prolapse.  On examination, at that time she did have a third-degree cystocele, second-degree rectocele, and marked uterine descensus.  At that time, she has been treated with Septra DS for urinary tract infection.  The patient has noted progressive increase in symptoms to the point that it is creating significant difficulty and discomfort. She has requested definitive surgery.  She was given and she reviewed a pelvic support pamphlet produced by the ACOG and other ACOG materials describing the procedure and the potential risks and benefits.  She has requested definitive surgery.  She is admitted now for laparoscopically- assisted vaginal hysterectomy, bilateral salpingo-oophorectomy, anterior and posterior vaginal repair, and sacrospinous ligament fixation of the upper vagina. She has no other major complaints at the present time, specifically no cardiopulmonary, GI, or other GU symptoms or problems.  PAST MEDICAL HISTORY:  The patient has an asthmatic condition.  She has irritable bowel syndrome.  She has recurrent urinary tract infections. She has arthritis, and she has adult-onset type 2 diabetes and hypertension.  CURRENT MEDICATIONS:  Diovan and hydrochlorothiazide 320/12.5, Norvasc 5- 10 mg per day.  She takes Prilosec for regurg symptoms, pravastatin for her cholesterol, metformin 1 g a day for  diabetes, and Zoloft 50 mg a day.  She has no known drug allergies.  She is not a cigarette smoker and does not use alcohol.  FAMILY HISTORY:  Noncontributory.  PHYSICAL EXAMINATION:  HEENT:  Grossly within normal limits. VITAL SIGNS:  Blood pressure in the office 160/90, weight 142. NECK:  Thyroid gland is not palpably enlarged to my examination. CHEST:  Clear to auscultation throughout. HEART:  Normal sinus rhythm without murmurs, rubs, or gallops. BREASTS:  Considered to be normal.  No palpable masses.  No skin change or nipple discharge. HEART:  Normal sinus rhythm without murmurs, rubs, or gallops. ABDOMEN:  Soft and nontender.  No appreciable organomegaly or palpable masses. PELVIC:  Findings are described above as the admitting diagnosis.  Her bimanual exam reveals no palpable enlargement of the uterus or ovaries. The rectum is palpably normal and confirms the bimanual findings. EXTREMITIES:  Without cyanosis, clubbing, or edema.  ASSESSMENT:  Symptomatic pelvic relaxation with cystocele, rectocele, and uterine descensus.  PLAN:  Laparoscopic-assisted vaginal hysterectomy, bilateral salpingo- oophorectomy, anterior and posterior vaginal repair, sacrospinous ligament fixation of vaginal apex.     Freddy Finner, M.D.     WRN/MEDQ  D:  06/22/2011  T:  06/22/2011  Job:  [id number]

## 2011-06-22 NOTE — Op Note (Signed)
[name], [name]                ACCOUNT NO.:  [id number]  MEDICAL RECORD NO.:  [id number]  LOCATION:  9304                          FACILITY:  [facility]  PHYSICIAN:  Freddy Finner, M.D.   DATE OF BIRTH:  [birth year]  DATE OF PROCEDURE:  06/21/2011 DATE OF DISCHARGE:                              OPERATIVE REPORT   PREOPERATIVE DIAGNOSES:  Symptomatic pelvic relaxation with second- degree cystocele, first-degree rectocele, uterine descensus.  POSTOPERATIVE DIAGNOSES:  Symptomatic pelvic relaxation with second- degree cystocele, first-degree rectocele, uterine descensus.  OPERATIVE PROCEDURES:  Laparoscopically-assisted vaginal hysterectomy, bilateral salpingo-oophorectomy, anterior and posterior vaginal repair, sacrospinous ligament fixation of vaginal apex.  SURGEON:  Freddy Finner, MD  ANESTHESIA:  General endotracheal.  SURGICAL ASSISTANT:  Dineen Kid. Rana Snare, MD  ESTIMATED INTRAOPERATIVE BLOOD LOSS:  100-150 mL.  INTRAOPERATIVE COMPLICATIONS:  None.  SECONDARY PROCEDURE:  Placement of a suprapubic catheter.  DETAILS OF THE PRESENT ILLNESS RECORDED IN THE ADMISSION NOTE:  The patient was admitted on the morning of surgery.  She was given a bolus of Ancef IV preoperatively.  She was placed in PAS hose.  She was brought to the operating room and placed under adequate general endotracheal anesthesia and placed in the dorsal lithotomy position using Allen stirrup system.  Betadine scrub followed by Betadine solution was used to prep the abdomen, perineum and vagina.  Bladder was evacuated with sterile technique using a Robinson catheter.  Hulka tenaculum was attached to the cervix under direct visualization. Sterile drapes were applied.  Two small incisions were made in the abdomen and one at the umbilicus and one just above the symphysis.  A disposable Veress needle was introduced through the anterior abdominal wall through the upper incision while elevating the anterior  abdominal wall manually.  Pneumoperitoneum was allowed to accumulate with carbon dioxide gas.  An 11-mm bladed disposable trocar was then placed while elevating the anterior abdominal wall and entry into the abdomen was accomplished without evidence of injury on entry.  Pneumoperitoneum was further accumulated with carbon dioxide gas and a 5-mm trocar placed in the lower incision which was just above the symphysis.  Systematic scanning inspection of the abdomen was carried out as well as the pelvis.  There were no apparent abnormalities in the pelvis.  The appendix was visualized and was normal.  There were some filmy adhesions not creating a blind loop in the right lower quadrant above the pelvic brim.  The liver was not visualized and no apparent abnormalities were noted in the upper abdomen.  Using a spring-loaded grasping forceps for traction and exposure and using the Ethicon trio device through the operating channel of the laparoscope, the right infundibulopelvic ligament was progressively sealed and divided.  The dissection was carried through the round ligament and through the upper portion of the broad ligament to the level of the uterine artery.  Each side was treated identically.  Attention was then turned vaginally.  Posterior weighted vaginal retractor was placed.  A Jacobs tenaculum was attached to the cervix after removing the Hulka tenaculum.  Deaver retractors were used anteriorly and laterally for exposure.  Colpotomy incision was made posterior to the cervix while tenting the  mucosa and the cul-de-sac and the cul-de-sac entered.  The long blade narrow posterior retractor was then placed.  Cervix was circumscribed with a scalpel to release the mucosa.  The LigaSure device was then used to seal and divide the uterosacral pedicles and the bladder pillars.  Bladder was further advanced off the cervix.  Please note, the patient was given IV indigo carmine and starting the  dissection to help identify bladder were to recur.  Throughout the case, Normodyne was visualized in the operative field.  After securing the cardinal ligament pedicles with the LigaSure device, the anterior peritoneum was entered.  Vessel pedicles were taken, sealed and divided with LigaSure also and this allowed delivery of the uterus, tubes and ovaries through the vaginal introitus.  Angles of the vagina were then anchored to uterosacrals and mattress sutures of 0 Monocryl.  Uterosacrals were plicated and posterior peritoneum closed with interrupted 0 Monocryl suture.  Posterior half of the cuff was closed vertically with figure-of-eights of 0 Monocryl.  Anterior mucosa was grasped on either side and the mucosa overlying the bladder tented in the midline.  A progressive dissection was carried out using blunt and sharp dissection.  An incision was made along the anterior vaginal wall for approximately 6-7 cm.  With careful sharp and blunt dissection, the vascular or the urogenital fascia was freed from the mucosa.  Using plication sutures of 0 Monocryl, the urogenital fascia was reapproximated in the midline to elevate and support the cystocele. Segments of mucosa were excised from the anterior vaginal wall on either side.  The vaginal cuff was then completely closed with figure-of-eights of 0 Monocryl.  The remaining incision was closed in the running 2-0 Monocryl suture.  Attention was then turned posteriorly.  The fourchette was grasped on either side with Allis clamps and an horizontal incision made along the perineal body.  The mucosa overlying the rectum was then treated and essentially identical manner to the anterior vaginal wall with dissection in the rectum and perirectal tissues away from the mucosa with blunt and sharp dissection.  After the dissection, the right sacrospinous ligament was identified and using a Capio device into the absorbable suture.  This suture was  anchored in the sacrospinous ligament and attached to the vaginal mucosa just above the level of the incision.  Plication sutures were used to plicate the perirectal structures in the midline to recreate the rectovaginal septum.  The levators were approximated in interrupted 0 Monocryl suture.  The mucosal closure was then started using a 2-0 Monocryl and carried for a distance approximately 3-4 cm.  Sacrospinous suture was tied and markedly elevated at the vaginal apex into an appropriate anatomical location.  The closure was completed using the 2-0 Monocryl in a fashion similar to episiotomy repair.  Hemostasis was adequate and for that reason packing was not placed.  The bladder was then filled using a Foley catheter and irrigating solution.  Suprapubic banana catheter was placed in the bladder without difficulty and entered into the skin with silk sutures.  Reinspection laparoscopically was carried out and the shunt system was used.  Irrigation of the pelvis was carried out. Hemostasis was complete.  Irrigating solution was aspirated from the abdomen.  Gas was allowed to escape.  Skin incision was closed with interrupted subcuticular sutures of 3-0 Vicryl and were anesthetized with 0.25% Marcaine.  Steri-Strips were also applied to the lower incision and sterile dressing to the umbilical incision.  The patient was awakened and taken to the  recovery room in good condition.     Freddy Finner, M.D.     WRN/MEDQ  D:  06/21/2011  T:  06/22/2011  Job:  [id number]

## 2011-06-23 LAB — GLUCOSE, CAPILLARY: Glucose-Capillary: 102 mg/dL — ABNORMAL HIGH (ref 70–99)

## 2011-06-23 MED ORDER — SODIUM CHLORIDE 0.45 % IV SOLN: INTRAVENOUS | Status: DC

## 2011-06-23 MED ORDER — OXYCODONE-ACETAMINOPHEN 5-325 MG PO TABS: 1.0000 | ORAL_TABLET | ORAL | Status: AC | PRN

## 2011-06-23 NOTE — Progress Notes (Signed)
2 Days Post-Op Procedure(s): LAPAROSCOPIC ASSISTED VAGINAL HYSTERECTOMY SALPINGO OOPHERECTOMY ANTERIOR (CYSTOCELE) AND POSTERIOR REPAIR (RECTOCELE) SACROPEXY  Subjective: Patient reports incisional pain, tolerating PO and no problems voiding.    Objective: I have reviewed patient's vital signs, intake and output and medications.  General: alert abd is soft, nontender and non distended  Assessment: s/p Procedure(s): LAPAROSCOPIC ASSISTED VAGINAL HYSTERECTOMY SALPINGO OOPHERECTOMY ANTERIOR (CYSTOCELE) AND POSTERIOR REPAIR (RECTOCELE) SACROPEXY: progressing well and tolerating diet  Plan: Discharge home rx percocet followup with dr. Jennette Kettle  LOS: 2 days    Carzell Saldivar RONALD 06/23/2011, 1:59 PM

## 2011-06-23 NOTE — Discharge Summary (Signed)
Admission Diagnosis: Pelvic Relaxation  Discharge Diagnosis: Same  Hospital Course: On day of admission patient underwent LAVH BSO anterior repair posterior repair and sacrospinous ligament fixation. Post op course uncomplicated. She went home in good condition on POD #2 . Discharged with po percocet. No driving for 1 week. No sex for 6 weeks. Advised to call for temp > 100.5 , nausea, bleeding or severe abdominal pain. Followup with Dr. Jennette Kettle

## 2011-06-23 NOTE — H&P (Signed)
[name], [name]                ACCOUNT NO.:  [id number]  MEDICAL RECORD NO.:  [id number]  LOCATION:  9304                          FACILITY:  [facility]  PHYSICIAN:  Freddy Finner, M.D.   DATE OF BIRTH:  [birth year]  DATE OF ADMISSION:  06/21/2011 DATE OF DISCHARGE:                             HISTORY & PHYSICAL   This is the first postoperative day for [name] who during the day developed some hypoxia with rest.  This required nasal cannula oxygen to maintain an O2 sat of 90% or so.  She dropped into the high 70s while sleeping without oxygen.  Additional workup today included a chest x-ray which was suggestive of pneumonia.  Also, it was noted that she has emphysema.  A spiral CT of the chest was also obtained which identified some bibasilar atelectasis and moderate emphysema.  Subjectively, the patient was comfortable and in no acute distress.  Her CBC today showed a white count of 10.7.  She remained afebrile with vital signs stable throughout the day and on the evening rounds, was alert, oriented, and states that this is her usual situation.  In addition, she did receive respiratory therapy with albuterol.  On the evening rounds, it was elected to discontinue this and to resume her inhaler which she routinely uses; Flomax, I think, with 2 puffs twice a day.  The patient lost her suprapubic catheter on the evening of surgery and now has a Foley in place.  She is making adequate urine output after receiving some intravenous Lasix because of the wet rales heard by the Anesthesia Department on their postoperative rounds.  Her condition is considered to be satisfactory.  Further plans were outlined in the hospital chart orders.     Freddy Finner, M.D.     WRN/MEDQ  D:  06/22/2011  T:  06/23/2011  Job:  [id number]

## 2011-06-24 NOTE — Discharge Summary (Signed)
[name], [name]                ACCOUNT NO.:  [id number]  MEDICAL RECORD NO.:  [id number]  LOCATION:  9304                          FACILITY:  [facility]  PHYSICIAN:  Freddy Finner, M.D.   DATE OF BIRTH:  November 23, 1931  DATE OF ADMISSION:  06/21/2011 DATE OF DISCHARGE:  06/23/2011                              DISCHARGE SUMMARY   DISCHARGE DIAGNOSES:  GYN diagnosis of third-degree cystocele, second- degree rectocele, marked uterine descensus, clinical symptoms of pelvic relaxation without urinary incontinence.  SECONDARY DIAGNOSES:  Include numerous medical conditions outlined in the present illness, including emphysema, diabetes type 2, hypertension, hypercholesterolemia.  OPERATIVE PROCEDURES:  Laparoscopic-assisted vaginal hysterectomy, bilateral salpingo-oophorectomy, anterior approach and posterior vaginal repair, sacrospinous ligament fixation of vaginal apex.  INTRAOPERATIVE COMPLICATIONS:  None.  POSTOPERATIVE COMPLICATIONS:  Essentially none, although her initial postoperative course was remarkable for desaturation and resting and under the postoperative effects of anesthesia.  Based on this, a chest x- ray and a spiral CT of the chest were obtained, and the conclusion eventually was that this was simply baseline findings for her based on her chronic obstructive pulmonary disease.  There were no other postoperative complications.  DISPOSITION:  The patient was in satisfactory improved condition.  On the second postoperative day, she was voiding with essentially complete emptying of her bladder as confirmed by ultrasound.  She was having adequate bladder function.  She was subjectively in good condition and baseline for her respiratory status.  She was ambulating without assistance, tolerating a regular diet.  She is discharged home to resume all of her preoperative medications, which are outlined carefully already in the chart.  She is to avoid vaginal entry or heavy  lifting. She is to have progressively increasing physical activity, but ambulating regularly as often as possible.  She is to report fever or heavy bleeding.  Details of the present illness recorded in the admission note.  Briefly, the patient presented complaining of pelvic relaxation syndrome. Findings on physical exam were essentially those of the postoperative diagnosis and the patient has a systolic murmur, which is somewhat blowing.  There were fine rales at her bases.  Laboratory data is recorded in the laboratory section of the chart.  X- ray findings also recorded and noted as presented above.  HOSPITAL COURSE:  The patient was admitted on the morning of surgery. She was given a bolus of Ancef.  She was placed in PAS hose.  She was brought to the operating room, placed under adequate general endotracheal anesthesia where the above-described surgical procedure was accomplished with a minimal intraoperative blood loss and no intraoperative complications.  Postoperatively, the only significant event is noted above in the discharge instructions.  The patient was noted to desaturate while sleeping or resting.  She was placed on nasal cannula.  She had x-ray evaluation as noted above.  There was a comment of the potential for aspiration pneumonia, but clinically, this did not fit since she was completely afebrile and without pulmonary complaints. She was treated with respiratory therapy treatments and treated with Unasyn during the first and second postoperative days.  By the second postoperative day, she was voiding with essentially complete emptying of her  bladder.  She was ambulating without assistance.  She was taking her regular diet.  She was discharged home on Percocet 5/325 to be taken 1 or 2 every 4 hours as needed for pain.  She is to resume all of her preoperative medication.  She is to return to the hospital for severe pain or heavy vaginal bleeding.  She is to call for  fever.  She is to return to the office in approximately 1 week for postoperative followup.     Freddy Finner, M.D.     WRN/MEDQ  D:  06/23/2011  T:  06/24/2011  Job:  [id number]

## 2011-09-29 ENCOUNTER — Encounter: Payer: Self-pay | Admitting: Gastroenterology

## 2011-10-10 ENCOUNTER — Encounter: Payer: Self-pay | Admitting: Gastroenterology

## 2011-10-26 ENCOUNTER — Encounter: Payer: Self-pay | Admitting: Pulmonary Disease

## 2011-10-26 ENCOUNTER — Ambulatory Visit (INDEPENDENT_AMBULATORY_CARE_PROVIDER_SITE_OTHER): Payer: Medicare Other | Admitting: Pulmonary Disease

## 2011-10-26 VITALS — BP 142/68 | HR 87 | Temp 97.7°F | Ht 61.0 in | Wt 137.4 lb

## 2011-10-26 DIAGNOSIS — R0989 Other specified symptoms and signs involving the circulatory and respiratory systems: Secondary | ICD-10-CM

## 2011-10-26 DIAGNOSIS — J449 Chronic obstructive pulmonary disease, unspecified: Secondary | ICD-10-CM | POA: Insufficient documentation

## 2011-10-26 NOTE — Patient Instructions (Addendum)
Will set up for breathing studies, and see you back same day for review. Stop symbicort, and will give you an albuterol inhaler to have for emergencies only.  Can take 2 inhalations up to every 6hrs if needed.

## 2011-10-26 NOTE — Progress Notes (Signed)
  Subjective:    Patient ID: [name], female    DOB: [birth year], 78 y.o.   MRN: [id number]  HPI The patient is a 78 year old female who I've been asked to see for dyspnea.  The patient states that she has had breathing problems for years, and has been told that she is a "shallow breather".  She has never been told or diagnosed with asthma.  She states that she can walk long distances on flat ground at a moderate pace, but will give winded with elevations such as hills or one flight of stairs.  She will get short of breath bringing groceries in from the car, but not with light housework.  She does have a history of smoking for 15 years, and had a chest CT last year that clearly had emphysematous changes.  She recently had spirometry with her primary care physician which did suggest airflow obstruction, however the patient had an excellent shunt time of only 4-5 seconds.  I have explained to her we like to see 6-8 seconds minimum.  The patient was started on symbicort, but really has not noticed an improvement in her breathing.  She has had an increased cough since being on the medication.  The patient denies any chronic lower extremity edema, and to her knowledge has never had any cardiac issues.  She has not had a recent cardiac evaluation.  The patient states that her weight has been stable.   Review of Systems  Constitutional: Negative for fever and unexpected weight change.  HENT: Positive for congestion and sneezing. Negative for ear pain, nosebleeds, sore throat, rhinorrhea, trouble swallowing, dental problem, postnasal drip and sinus pressure.   Eyes: Negative for redness and itching.  Respiratory: Positive for cough and shortness of breath. Negative for chest tightness and wheezing.   Cardiovascular: Negative for palpitations and leg swelling.  Gastrointestinal: Negative for nausea and vomiting.  Genitourinary: Negative for dysuria.  Musculoskeletal: Negative for joint swelling.  Skin:  Negative for rash.  Neurological: Negative for headaches.  Hematological: Does not bruise/bleed easily.  Psychiatric/Behavioral: Positive for dysphoric mood. The patient is not nervous/anxious.        Objective:   Physical Exam Constitutional:  Well developed, no acute distress  HENT:  Nares patent without discharge  Oropharynx without exudate, palate and uvula are normal  Eyes:  Perrla, eomi, no scleral icterus  Neck:  No JVD, no TMG  Cardiovascular:  Normal rate, regular rhythm, no rubs or gallops.  No murmurs        Intact distal pulses  Pulmonary :  Normal breath sounds, no stridor or respiratory distress   No rales, rhonchi, or wheezing  Abdominal:  Soft, nondistended, bowel sounds present.  No tenderness noted.   Musculoskeletal:  No lower extremity edema noted.  Lymph Nodes:  No cervical lymphadenopathy noted  Skin:  No cyanosis noted  Neurologic:  Alert, appropriate, moves all 4 extremities without obvious deficit.         Assessment & Plan:

## 2011-10-26 NOTE — Assessment & Plan Note (Signed)
The patient clearly has emphysematous changes on a chest CT last year, but it is unclear whether she has clinically significant airflow obstruction on PFTs.  She has been tried on symbicort, and really did not see the difference.  However, she is only been using it once a day because of increased cough.  At this point, I would like to schedule for full pulmonary function studies to get a more accurate assessment of her lung function.  If she does indeed have significant airflow obstruction, May try a different inhaler regimen that may have less issues with cough.  If she does not have significant airflow obstruction, would consider a cardiac evaluation for completeness if she has not had one recently.

## 2011-11-10 ENCOUNTER — Other Ambulatory Visit: Payer: Medicare Other

## 2011-11-10 ENCOUNTER — Ambulatory Visit (HOSPITAL_COMMUNITY)
Admission: RE | Admit: 2011-11-10 | Discharge: 2011-11-10 | Disposition: A | Discharge status: Home / Self Care | Payer: Medicare Other | Source: Ambulatory Visit | Attending: Pulmonary Disease | Admitting: Pulmonary Disease

## 2011-11-10 ENCOUNTER — Encounter: Payer: Self-pay | Admitting: Pulmonary Disease

## 2011-11-10 ENCOUNTER — Ambulatory Visit (INDEPENDENT_AMBULATORY_CARE_PROVIDER_SITE_OTHER): Payer: Medicare Other | Admitting: Pulmonary Disease

## 2011-11-10 VITALS — BP 134/70 | HR 78 | Temp 98.1°F | Ht 61.0 in | Wt 135.0 lb

## 2011-11-10 DIAGNOSIS — R0989 Other specified symptoms and signs involving the circulatory and respiratory systems: Secondary | ICD-10-CM | POA: Insufficient documentation

## 2011-11-10 DIAGNOSIS — R062 Wheezing: Secondary | ICD-10-CM | POA: Insufficient documentation

## 2011-11-10 DIAGNOSIS — J988 Other specified respiratory disorders: Secondary | ICD-10-CM | POA: Insufficient documentation

## 2011-11-10 DIAGNOSIS — Z79899 Other long term (current) drug therapy: Secondary | ICD-10-CM | POA: Insufficient documentation

## 2011-11-10 DIAGNOSIS — R0609 Other forms of dyspnea: Secondary | ICD-10-CM | POA: Insufficient documentation

## 2011-11-10 DIAGNOSIS — J449 Chronic obstructive pulmonary disease, unspecified: Secondary | ICD-10-CM

## 2011-11-10 LAB — PULMONARY FUNCTION TEST

## 2011-11-10 MED ORDER — ALBUTEROL SULFATE (5 MG/ML) 0.5% IN NEBU
2.5000 mg | INHALATION_SOLUTION | Freq: Once | RESPIRATORY_TRACT | Status: AC
  Administered 2011-11-10: 2.5 mg via RESPIRATORY_TRACT

## 2011-11-10 NOTE — Patient Instructions (Signed)
You have significant copd by your breathing studies.  This may be due to asthma, emphysema, or both Will start on dulera 100/5 2 puffs every am and pm everyday.  Rinse mouth well, gargle, and spit.   Start spiriva one inhalation each am.  Can use your albuterol for rescue only if needed.  Will send a blood test looking for a hereditary form of emphysema Please call me in 4 weeks with how things are going.   followup with me in 4mos.

## 2011-11-10 NOTE — Assessment & Plan Note (Signed)
The patient has moderate to severe airflow obstruction on her pulmonary function studies today.  I suspect there is a component of asthma here, but with her severe decrease in diffusion capacity, I think she also has some degree of emphysema.  I would like to start her on an aggressive bronchodilator regimen, and we'll use dulera since symbicort caused increased cough.  Will also add Spiriva as well.  Given her degree of airflow obstruction and very little smoking history in the past, will also check an alpha-1 antitrypsin level.

## 2011-11-10 NOTE — Progress Notes (Signed)
  Subjective:    Patient ID: [name], female    DOB: 23-Jan-1932, 78 y.o.   MRN: [id number]  HPI The patient comes in today for followup after her recent pulmonary function studies, ordered as part of a workup for dyspnea on exertion.  She was found to have moderate to severe airflow obstruction, but a 19% improvement in FEV1 with bronchodilator.  She had no restriction, but her diffusion capacity was 29% of predicted.  I have reviewed this study with her in detail, and answered all of her questions.   Review of Systems  Constitutional: Negative for fever and unexpected weight change.  HENT: Positive for congestion and sneezing. Negative for ear pain, nosebleeds, sore throat, rhinorrhea, trouble swallowing, dental problem, postnasal drip and sinus pressure.   Eyes: Negative for redness and itching.  Respiratory: Positive for shortness of breath. Negative for cough, chest tightness and wheezing.   Cardiovascular: Negative for palpitations and leg swelling.  Gastrointestinal: Negative for nausea and vomiting.  Genitourinary: Negative for dysuria.  Musculoskeletal: Negative for joint swelling.  Skin: Negative for rash.  Neurological: Positive for headaches.  Hematological: Does not bruise/bleed easily.  Psychiatric/Behavioral: Negative for dysphoric mood. The patient is not nervous/anxious.        Objective:   Physical Exam Well developed in nad Nose without purulence or discharge noted. Chest with mild decrease in bs LE without edema, no cyanosis  Alert and oriented, moves all 4.        Assessment & Plan:

## 2011-11-15 LAB — ALPHA-1 ANTITRYPSIN PHENOTYPE: A-1 Antitrypsin: 139 mg/dL (ref 83–199)

## 2011-11-24 ENCOUNTER — Encounter: Payer: Self-pay | Admitting: Gastroenterology

## 2011-11-24 ENCOUNTER — Ambulatory Visit (AMBULATORY_SURGERY_CENTER): Payer: Medicare Other | Admitting: *Deleted

## 2011-11-24 VITALS — Ht 61.0 in | Wt 135.0 lb

## 2011-11-24 DIAGNOSIS — Z1211 Encounter for screening for malignant neoplasm of colon: Secondary | ICD-10-CM

## 2011-11-24 MED ORDER — PEG-KCL-NACL-NASULF-NA ASC-C 100 G PO SOLR: ORAL | Status: DC

## 2011-12-08 ENCOUNTER — Encounter: Payer: Self-pay | Admitting: Gastroenterology

## 2011-12-08 ENCOUNTER — Ambulatory Visit (AMBULATORY_SURGERY_CENTER): Payer: Medicare Other | Admitting: Gastroenterology

## 2011-12-08 VITALS — BP 142/83 | HR 81 | Temp 98.7°F | Resp 19 | Ht 61.0 in | Wt 135.0 lb

## 2011-12-08 DIAGNOSIS — Z860100 Personal history of colon polyps, unspecified: Secondary | ICD-10-CM

## 2011-12-08 DIAGNOSIS — Z1211 Encounter for screening for malignant neoplasm of colon: Secondary | ICD-10-CM

## 2011-12-08 LAB — GLUCOSE, CAPILLARY: Glucose-Capillary: 137 mg/dL — ABNORMAL HIGH (ref 70–99)

## 2011-12-08 MED ORDER — SODIUM CHLORIDE 0.9 % IV SOLN: 500.0000 mL | INTRAVENOUS | Status: DC

## 2011-12-08 NOTE — Progress Notes (Signed)
Patient did not have preoperative order for IV antibiotic SSI prophylaxis. (G8918)  Patient did not experience any of the following events: a burn prior to discharge; a fall within the facility; wrong site/side/patient/procedure/implant event; or a hospital transfer or hospital admission upon discharge from the facility. (G8907)  

## 2011-12-08 NOTE — Op Note (Signed)
 [facility] [address] [place], [place]  [address]  COLONOSCOPY PROCEDURE REPORT  PATIENT:  [name], [name]  MR#:  [id number] BIRTHDATE:  [birth year], 79 yrs. old  GENDER:  female ENDOSCOPIST:  Judie Petit T. Russella Dar, MD, [facility]  PROCEDURE DATE:  12/08/2011 PROCEDURE:  Colonoscopy 21308 ASA CLASS:  Class II INDICATIONS:  1) surveillance and high-risk screening  2) history of pre-cancerous (adenomatous) colon polyps: 1992 MEDICATIONS:   These medications were titrated to patient response per physician's verbal order, Fentanyl 75 mcg IV, Versed 9 mg IV DESCRIPTION OF PROCEDURE:   After the risks benefits and alternatives of the procedure were thoroughly explained, informed consent was obtained.  Digital rectal exam was performed and revealed no abnormalities.   The LB CF-[id number] endoscope was introduced through the anus and advanced to the cecum, which was identified by both the appendix and ileocecal valve, without limitations.  The quality of the prep was excellent, using MoviPrep.  The instrument was then slowly withdrawn as the colon was fully examined. <<PROCEDUREIMAGES>> FINDINGS:  Moderate diverticulosis was found in the sigmoid to descending colon.  Otherwise normal colonoscopy without other polyps, masses, vascular ectasias, or inflammatory changes. Retroflexed views in the rectum revealed no abnormalities. The time to cecum =  5.33  minutes. The scope was then withdrawn (time = 8.5  min) from the patient and the procedure completed.  COMPLICATIONS:  None  ENDOSCOPIC IMPRESSION: 1) Moderate diverticulosis in the sigmoid to descending colon  RECOMMENDATIONS: 1) High fiber diet with liberal fluid intake. 2) Given your age, you will not need another colonoscopy for colon cancer screening or polyp surveillance. These types of tests usually stop around the age 109.  Venita Lick. Russella Dar, MD, [facility]  CC:  Laurann Montana, MD  n. Rosalie DoctorVenita Lick. Ardine Iacovelli at  12/08/2011 11:26 AM  Richardean Canal, [phone]

## 2011-12-08 NOTE — Patient Instructions (Signed)
YOU HAD AN ENDOSCOPIC PROCEDURE TODAY AT THE [facility]: Refer to the procedure report that was given to you for any specific questions about what was found during the examination.  If the procedure report does not answer your questions, please call your gastroenterologist to clarify.  If you requested that your care partner not be given the details of your procedure findings, then the procedure report has been included in a sealed envelope for you to review at your convenience later.  YOU SHOULD EXPECT: Some feelings of bloating in the abdomen. Passage of more gas than usual.  Walking can help get rid of the air that was put into your GI tract during the procedure and reduce the bloating. If you had a lower endoscopy (such as a colonoscopy or flexible sigmoidoscopy) you may notice spotting of blood in your stool or on the toilet paper. If you underwent a bowel prep for your procedure, then you may not have a normal bowel movement for a few days.  DIET: Your first meal following the procedure should be a light meal and then it is ok to progress to your normal diet.  A half-sandwich or bowl of soup is an example of a good first meal.  Heavy or fried foods are harder to digest and may make you feel nauseous or bloated.  Likewise meals heavy in dairy and vegetables can cause extra gas to form and this can also increase the bloating.  Drink plenty of fluids but you should avoid alcoholic beverages for 24 hours.  ACTIVITY: Your care partner should take you home directly after the procedure.  You should plan to take it easy, moving slowly for the rest of the day.  You can resume normal activity the day after the procedure however you should NOT DRIVE or use heavy machinery for 24 hours (because of the sedation medicines used during the test).    SYMPTOMS TO REPORT IMMEDIATELY: A gastroenterologist can be reached at any hour.  During normal business hours, 8:30 AM to 5:00 PM Monday through Friday,  call [phone].  After hours and on weekends, please call the GI answering service at [phone] who will take a message and have the physician on call contact you.   Following lower endoscopy (colonoscopy or flexible sigmoidoscopy):  Excessive amounts of blood in the stool  Significant tenderness or worsening of abdominal pains  Swelling of the abdomen that is new, acute  Fever of 100F or higher    FOLLOW UP: If any biopsies were taken you will be contacted by phone or by letter within the next 1-3 weeks.  Call your gastroenterologist if you have not heard about the biopsies in 3 weeks.  Our staff will call the home number listed on your records the next business day following your procedure to check on you and address any questions or concerns that you may have at that time regarding the information given to you following your procedure. This is a courtesy call and so if there is no answer at the home number and we have not heard from you through the emergency physician on call, we will assume that you have returned to your regular daily activities without incident.  SIGNATURES/CONFIDENTIALITY: You and/or your care partner have signed paperwork which will be entered into your electronic medical record.  These signatures attest to the fact that that the information above on your After Visit Summary has been reviewed and is understood.  Full responsibility of the confidentiality   of this discharge information lies with you and/or your care-partner.     

## 2011-12-11 ENCOUNTER — Telehealth: Payer: Self-pay

## 2011-12-11 NOTE — Telephone Encounter (Signed)
  Follow up Call-  Call back number 12/08/2011  Post procedure Call Back phone  # [phone] hm  Permission to leave phone message Yes     Patient questions:  Do you have a fever, pain , or abdominal swelling? no Pain Score  0 *  Have you tolerated food without any problems? yes  Have you been able to return to your normal activities? yes  Do you have any questions about your discharge instructions: Diet   no Medications  no Follow up visit  no  Do you have questions or concerns about your Care? no  Actions: * If pain score is 4 or above: No action needed, pain <4.  Per the pt "thank Dr. Russella Dar for taking such good care of her for 20 yrs and also the nurses were wonderful". MAW

## 2011-12-12 ENCOUNTER — Telehealth: Payer: Self-pay | Admitting: Pulmonary Disease

## 2011-12-12 NOTE — Telephone Encounter (Signed)
Spoke with pt. She states wanted to let [facility] know that she tried taking spiriva for 1 wk and this seemed to cause her to cough and have increased mucus production. She states that she stopped taking med for a few days and this got better. She states that taking the dulera alone has really helped her breathing, which seems to be normal now. She states that [facility] had given her samples of dulera, but Dr.White gave her rx for this already and she has had this filled and will not need refills for a while. Will forward to [facility] as FYI.

## 2011-12-12 NOTE — Telephone Encounter (Signed)
Let pt know sounds good.  Stay on dulera alone Keep f/u apptm with me.

## 2011-12-13 NOTE — Telephone Encounter (Signed)
[email or web address].Deandrea Vanpelt, CMA  

## 2011-12-13 NOTE — Telephone Encounter (Signed)
Pt aware. Avi Kerschner, CMA  

## 2011-12-28 ENCOUNTER — Ambulatory Visit (INDEPENDENT_AMBULATORY_CARE_PROVIDER_SITE_OTHER): Payer: Medicare Other | Admitting: Family Medicine

## 2011-12-28 VITALS — BP 146/73 | HR 83 | Temp 98.0°F | Resp 20 | Ht 60.0 in | Wt 136.2 lb

## 2011-12-28 DIAGNOSIS — J40 Bronchitis, not specified as acute or chronic: Secondary | ICD-10-CM

## 2011-12-28 DIAGNOSIS — J449 Chronic obstructive pulmonary disease, unspecified: Secondary | ICD-10-CM

## 2011-12-28 DIAGNOSIS — J209 Acute bronchitis, unspecified: Secondary | ICD-10-CM

## 2011-12-28 NOTE — Progress Notes (Signed)
This 78 year old woman comes in with a history of COPD and a history of bronchitis. She's been coughing 2 days, coughing productive and worsening. She has no sinus congestion or headache, no nausea, no vomiting, no diarrhea, no chest pain, no history of asthma, and she's not smoking. She's not short of breath but the cough is getting more and more violent.  Objective: HEENT unremarkable  Neck: Supple no adenopathy  Chest: Increased AP diameter, decreased breath sounds bilaterally with few rhonchi and very deep gurgling cough  Heart: Regular no murmur or gallop her graft extremities: No edema, skin warm and dry  Assessment: COPD with 60 acute exacerbation  Plan: Levaquin, Hydromet, prednisone 20 mg 3-3-to-to-1-1 with a daily

## 2012-03-11 ENCOUNTER — Ambulatory Visit: Payer: Medicare Other | Admitting: Pulmonary Disease

## 2012-03-12 ENCOUNTER — Ambulatory Visit: Payer: Medicare Other | Admitting: Pulmonary Disease

## 2012-04-01 ENCOUNTER — Other Ambulatory Visit: Payer: Self-pay | Admitting: Family Medicine

## 2012-04-01 DIAGNOSIS — E041 Nontoxic single thyroid nodule: Secondary | ICD-10-CM

## 2012-04-04 ENCOUNTER — Ambulatory Visit
Admission: RE | Admit: 2012-04-04 | Discharge: 2012-04-04 | Disposition: A | Discharge status: Home / Self Care | Payer: Medicare Other | Source: Ambulatory Visit | Attending: Family Medicine | Admitting: Family Medicine

## 2012-04-04 DIAGNOSIS — E041 Nontoxic single thyroid nodule: Secondary | ICD-10-CM

## 2012-04-08 ENCOUNTER — Other Ambulatory Visit: Payer: Self-pay | Admitting: Family Medicine

## 2012-04-08 DIAGNOSIS — E042 Nontoxic multinodular goiter: Secondary | ICD-10-CM

## 2012-04-16 ENCOUNTER — Ambulatory Visit
Admission: RE | Admit: 2012-04-16 | Discharge: 2012-04-16 | Disposition: A | Discharge status: Home / Self Care | Payer: Medicare Other | Source: Ambulatory Visit | Attending: Family Medicine | Admitting: Family Medicine

## 2012-04-16 ENCOUNTER — Other Ambulatory Visit (HOSPITAL_COMMUNITY)
Admission: RE | Admit: 2012-04-16 | Discharge: 2012-04-16 | Disposition: A | Discharge status: Home / Self Care | Payer: Medicare Other | Source: Ambulatory Visit | Attending: Interventional Radiology | Admitting: Interventional Radiology

## 2012-04-16 DIAGNOSIS — E049 Nontoxic goiter, unspecified: Secondary | ICD-10-CM | POA: Insufficient documentation

## 2012-04-16 DIAGNOSIS — E042 Nontoxic multinodular goiter: Secondary | ICD-10-CM

## 2013-03-27 ENCOUNTER — Other Ambulatory Visit: Payer: Self-pay | Admitting: Family Medicine

## 2013-03-27 ENCOUNTER — Ambulatory Visit
Admission: RE | Admit: 2013-03-27 | Discharge: 2013-03-27 | Disposition: A | Discharge status: Home / Self Care | Payer: Medicare Other | Source: Ambulatory Visit | Attending: Family Medicine | Admitting: Family Medicine

## 2013-03-27 DIAGNOSIS — J45909 Unspecified asthma, uncomplicated: Secondary | ICD-10-CM

## 2013-05-05 ENCOUNTER — Ambulatory Visit: Payer: Medicare Other | Admitting: Cardiology

## 2013-06-05 ENCOUNTER — Encounter: Payer: Self-pay | Admitting: *Deleted

## 2013-06-10 ENCOUNTER — Encounter: Payer: Self-pay | Admitting: Cardiology

## 2013-06-10 ENCOUNTER — Ambulatory Visit (INDEPENDENT_AMBULATORY_CARE_PROVIDER_SITE_OTHER): Payer: Medicare Other | Admitting: Cardiology

## 2013-06-10 VITALS — BP 136/68 | HR 66 | Ht 60.0 in | Wt 128.0 lb

## 2013-06-10 DIAGNOSIS — R0600 Dyspnea, unspecified: Secondary | ICD-10-CM

## 2013-06-10 DIAGNOSIS — I359 Nonrheumatic aortic valve disorder, unspecified: Secondary | ICD-10-CM

## 2013-06-10 DIAGNOSIS — I10 Essential (primary) hypertension: Secondary | ICD-10-CM

## 2013-06-10 DIAGNOSIS — I350 Nonrheumatic aortic (valve) stenosis: Secondary | ICD-10-CM

## 2013-06-10 DIAGNOSIS — R0609 Other forms of dyspnea: Secondary | ICD-10-CM

## 2013-06-10 DIAGNOSIS — E785 Hyperlipidemia, unspecified: Secondary | ICD-10-CM

## 2013-06-10 NOTE — Assessment & Plan Note (Signed)
Given multiple risk factors including diabetes mellitus plan Myoview for risk stratification.

## 2013-06-10 NOTE — Patient Instructions (Signed)
Your physician has requested that you have an echocardiogram. Echocardiography is a painless test that uses sound waves to create images of your heart. It provides your doctor with information about the size and shape of your heart and how well your heart's chambers and valves are working. This procedure takes approximately one hour. There are no restrictions for this procedure.  Your physician has requested that you have a lexiscan myoview. For further information please visit [email or web address] Please follow instruction sheet, as given.  Your physician wants you to follow-up in: 6 months. You will receive a reminder letter in the mail two months in advance. If you don't receive a letter, please call our office to schedule the follow-up appointment.

## 2013-06-10 NOTE — Progress Notes (Signed)
HPI: 79 year old female for evaluation of aortic stenosis. Patient apparently has moderate aortic stenosis. I do not have the echocardiogram as it was apparently performed at [facility] 1 year ago. Patient has chronic dyspnea on exertion which is unchanged. No orthopnea, PND, pedal edema, chest pain or syncope.  Current Outpatient Prescriptions  Medication Sig Dispense Refill  . acetaminophen (TYLENOL) 325 MG tablet Take 650 mg by mouth every 6 (six) hours as needed for pain.      [name] amLODipine (NORVASC) 5 MG tablet Take 5 mg by mouth daily.        [name] aspirin 81 MG tablet Take 160 mg by mouth daily.      . bimatoprost (LUMIGAN) 0.01 % SOLN Place 1 drop into both eyes at bedtime.        . Cholecalciferol (VITAMIN D-3 PO) Take 2,000 Units by mouth daily.       . fluticasone (FLOVENT HFA) 44 MCG/ACT inhaler Inhale 2 puffs into the lungs daily.      . metFORMIN (GLUCOPHAGE-XR) 500 MG 24 hr tablet Take 1,000 mg by mouth daily after supper.        . Multiple Vitamins-Minerals (OCUVITE-LUTEIN PO) Take 1 tablet by mouth daily.      [name] omeprazole (PRILOSEC) 20 MG capsule Take 10 mg by mouth at bedtime.       . pravastatin (PRAVACHOL) 40 MG tablet Take 40 mg by mouth daily.        . sertraline (ZOLOFT) 50 MG tablet Take 50 mg by mouth daily.        [name] trolamine salicylate (MYOFLEX) 10 % cream Apply 1 application topically as needed (for lower back pain).      . valsartan-hydrochlorothiazide (DIOVAN-HCT) 320-12.5 MG per tablet Take 1 tablet by mouth daily.       No current facility-administered medications for this visit.     Past Medical History  Diagnosis Date  . Hypertension   . Asthma     uses inhaler as needed  . Diabetes mellitus   . GERD (gastroesophageal reflux disease)   . Arthritis   . Anxiety   . Depression   . Hyperlipidemia   . Emphysema of lung   . Glaucoma     Past Surgical History  Procedure Laterality Date  . Ankle surgery  1998    d/t fx.  Left ankle  . Facial cosmetic  surgery      face lift  . Nose surgery    . Laparoscopic assisted vaginal hysterectomy  06/21/2011    Procedure: LAPAROSCOPIC ASSISTED VAGINAL HYSTERECTOMY;  Surgeon: Freddy Finner;  Location: [facility];  Service: Gynecology;  Laterality: N/A;  . Salpingoophorectomy  06/21/2011    Procedure: SALPINGO OOPHERECTOMY;  Surgeon: Freddy Finner;  Location: [facility];  Service: Gynecology;  Laterality: Bilateral;  . Anterior and posterior repair  06/21/2011    Procedure: ANTERIOR (CYSTOCELE) AND POSTERIOR REPAIR (RECTOCELE);  Surgeon: Freddy Finner;  Location: [facility];  Service: Gynecology;  Laterality: N/A;  . Vaginal prolapse repair  06/21/2011    Procedure: SACROPEXY;  Surgeon: Freddy Finner;  Location: [facility];  Service: Gynecology;  Laterality: N/A;  sacrospinous ligament suspension    History   Social History  . Marital Status: Married    Spouse Name: [name]    Number of Children: 1  . Years of Education: N/A   Occupational History  . retired [employer] operational officer   .     Social History Main Topics  . Smoking  status: Former Smoker -- 2.00 packs/day for 15 years    Types: Cigarettes    Quit date: 09/18/1972  . Smokeless tobacco: Never Used  . Alcohol Use: No  . Drug Use: No  . Sexual Activity: Not on file   Other Topics Concern  . Not on file   Social History Narrative  . No narrative on file    ROS: no fevers or chills, productive cough, hemoptysis, dysphasia, odynophagia, melena, hematochezia, dysuria, hematuria, rash, seizure activity, orthopnea, PND, pedal edema, claudication. Remaining systems are negative.  Physical Exam: Well-developed well-nourished in no acute distress.  Skin is warm and dry.  HEENT is normal.  Neck is supple. No thyromegaly. No jugular venous distention. Chest is clear to auscultation with normal expansion.  Cardiovascular exam is regular rate and rhythm. 3/6 systolic murmur left sternal border radiating to the carotids. S2 is not diminished. No gallops or  rubs. Abdominal exam nontender or distended. No masses palpated. Milligrams one nightly. 2+ femoral pulses. No bruits. Extremities show no edema. No chords palpated. 1+ dorsalis pedis pulses. neuro grossly intact  ECG sinus rhythm at a rate of 67. No ST changes.

## 2013-06-10 NOTE — Assessment & Plan Note (Signed)
Patient has a history of moderate aortic stenosis. He is at least moderate on exam. Plan repeat echocardiogram. I discussed the symptoms of progressive aortic stenosis including dyspnea, chest pain and syncope. I also explained she may require aortic valve replacement in the future.

## 2013-06-10 NOTE — Assessment & Plan Note (Signed)
Continue statin. 

## 2013-06-10 NOTE — Assessment & Plan Note (Signed)
Blood pressure controlled. Continue present medications. 

## 2013-06-12 ENCOUNTER — Ambulatory Visit (HOSPITAL_COMMUNITY): Payer: Medicare Other | Attending: Cardiology

## 2013-06-12 DIAGNOSIS — I059 Rheumatic mitral valve disease, unspecified: Secondary | ICD-10-CM | POA: Insufficient documentation

## 2013-06-12 DIAGNOSIS — I079 Rheumatic tricuspid valve disease, unspecified: Secondary | ICD-10-CM | POA: Insufficient documentation

## 2013-06-12 DIAGNOSIS — I350 Nonrheumatic aortic (valve) stenosis: Secondary | ICD-10-CM

## 2013-06-12 DIAGNOSIS — I359 Nonrheumatic aortic valve disorder, unspecified: Secondary | ICD-10-CM | POA: Insufficient documentation

## 2013-06-12 NOTE — Progress Notes (Signed)
Echocardiogram performed.  

## 2013-06-16 ENCOUNTER — Encounter: Payer: Self-pay | Admitting: Cardiology

## 2013-06-18 ENCOUNTER — Ambulatory Visit (HOSPITAL_COMMUNITY): Payer: Medicare Other | Attending: Cardiovascular Disease | Admitting: Radiology

## 2013-06-18 VITALS — BP 129/93 | Ht 60.0 in | Wt 126.0 lb

## 2013-06-18 DIAGNOSIS — I491 Atrial premature depolarization: Secondary | ICD-10-CM

## 2013-06-18 DIAGNOSIS — R0609 Other forms of dyspnea: Secondary | ICD-10-CM | POA: Insufficient documentation

## 2013-06-18 DIAGNOSIS — E119 Type 2 diabetes mellitus without complications: Secondary | ICD-10-CM | POA: Insufficient documentation

## 2013-06-18 DIAGNOSIS — I10 Essential (primary) hypertension: Secondary | ICD-10-CM | POA: Insufficient documentation

## 2013-06-18 DIAGNOSIS — I359 Nonrheumatic aortic valve disorder, unspecified: Secondary | ICD-10-CM | POA: Insufficient documentation

## 2013-06-18 DIAGNOSIS — I350 Nonrheumatic aortic (valve) stenosis: Secondary | ICD-10-CM

## 2013-06-18 DIAGNOSIS — R0602 Shortness of breath: Secondary | ICD-10-CM | POA: Insufficient documentation

## 2013-06-18 DIAGNOSIS — R002 Palpitations: Secondary | ICD-10-CM | POA: Insufficient documentation

## 2013-06-18 DIAGNOSIS — R0989 Other specified symptoms and signs involving the circulatory and respiratory systems: Secondary | ICD-10-CM | POA: Insufficient documentation

## 2013-06-18 DIAGNOSIS — Z87891 Personal history of nicotine dependence: Secondary | ICD-10-CM | POA: Insufficient documentation

## 2013-06-18 DIAGNOSIS — J438 Other emphysema: Secondary | ICD-10-CM | POA: Insufficient documentation

## 2013-06-18 DIAGNOSIS — J45909 Unspecified asthma, uncomplicated: Secondary | ICD-10-CM | POA: Insufficient documentation

## 2013-06-18 DIAGNOSIS — E785 Hyperlipidemia, unspecified: Secondary | ICD-10-CM | POA: Insufficient documentation

## 2013-06-18 MED ORDER — REGADENOSON 0.4 MG/5ML IV SOLN
0.4000 mg | Freq: Once | INTRAVENOUS | Status: AC
  Administered 2013-06-18: 0.4 mg via INTRAVENOUS

## 2013-06-18 MED ORDER — TECHNETIUM TC 99M SESTAMIBI GENERIC - CARDIOLITE
11.0000 | Freq: Once | INTRAVENOUS | Status: AC | PRN
  Administered 2013-06-18: 11 via INTRAVENOUS

## 2013-06-18 MED ORDER — TECHNETIUM TC 99M SESTAMIBI GENERIC - CARDIOLITE
33.0000 | Freq: Once | INTRAVENOUS | Status: AC | PRN
  Administered 2013-06-18: 33 via INTRAVENOUS

## 2013-06-18 NOTE — Progress Notes (Signed)
[facility] SITE 3 NUCLEAR MED [address] [place], [place] [address] [phone]    Cardiology Nuclear Med Study  [name] is a 79 y.o. female     MRN : [id number]     DOB: 1932/02/17  Procedure Date: 06/18/2013  Nuclear Med Background Indication for Stress Test:  Evaluation for Ischemia History:  Asthma, Emphysema and 09/14 ECHO:EF: 65-70% severe Aorta Stenosis Cardiac Risk Factors: History of Smoking, Hypertension, Lipids and NIDDM  Symptoms:  DOE, Palpitations and SOB   Nuclear Pre-Procedure Caffeine/Decaff Intake:  None NPO After: 6 pm   Lungs:  clear O2 Sat: 97% on room air. IV 0.9% NS with Angio Cath:  22g  IV Site: L Forearm  IV Started by:  Bonnita Levan, RN  Chest Size (in):  34 Cup Size: DDD  Height: 5' (1.524 m)  Weight:  126 lb (57.153 kg)  BMI:  Body mass index is 24.61 kg/(m^2). Tech Comments:  N/A    Nuclear Med Study 1 or 2 day study: 1 day  Stress Test Type:  Lexiscan  Reading MD: Charlton Haws, MD  Order Authorizing Provider:  Olga Millers, MD  Resting Radionuclide: Technetium 84m Sestamibi  Resting Radionuclide Dose: 11.0 mCi   Stress Radionuclide:  Technetium 69m Sestamibi  Stress Radionuclide Dose: 33.0 mCi           Stress Protocol Rest HR: 71 Stress HR: 96  Rest BP: 129/93 Stress BP: 155/75  Exercise Time (min): n/a METS: n/a   Predicted Max HR: 142 bpm % Max HR: 67.61 bpm Rate Pressure Product: 21308   Dose of Adenosine (mg):  n/a Dose of Lexiscan: 0.4 mg  Dose of Atropine (mg): n/a Dose of Dobutamine: n/a mcg/kg/min (at max HR)  Stress Test Technologist: Milana Na, EMT-P  Nuclear Technologist:  Harlow Asa, CNMT     Rest Procedure:  Myocardial perfusion imaging was performed at rest 45 minutes following the intravenous administration of Technetium 25m Sestamibi. Rest ECG: NSR - Normal EKG  Stress Procedure:  The patient received IV Lexiscan 0.4 mg over 15-seconds.  Technetium 48m Sestamibi injected at  30-seconds. This patient had sob, felt weird, and a headache with Lexiscan injection.  Quantitative spect images were obtained after a 45 minute delay. Stress ECG: No significant change from baseline ECG  QPS Raw Data Images:  Normal; no motion artifact; normal heart/lung ratio. Stress Images:  Normal homogeneous uptake in all areas of the myocardium. Rest Images:  Normal homogeneous uptake in all areas of the myocardium. Subtraction (SDS):  Normal Transient Ischemic Dilatation (Normal <1.22):  n/a Lung/Heart Ratio (Normal <0.45):  0.24  Quantitative Gated Spect Images QGS EDV:  52 ml QGS ESV:  11 ml  Impression Exercise Capacity:  Lexiscan with no exercise. BP Response:  Normal blood pressure response. Clinical Symptoms:  There is dyspnea. ECG Impression:  No significant ST segment change suggestive of ischemia. Comparison with Prior Nuclear Study: No images to compare  Overall Impression:  Normal stress nuclear study.  LV Ejection Fraction: 79%.  LV Wall Motion:  NL LV Function; NL Wall Motion   Charlton Haws

## 2013-06-20 ENCOUNTER — Other Ambulatory Visit: Payer: Self-pay | Admitting: Cardiology

## 2013-06-20 ENCOUNTER — Encounter: Payer: Self-pay | Admitting: *Deleted

## 2013-06-20 ENCOUNTER — Ambulatory Visit (INDEPENDENT_AMBULATORY_CARE_PROVIDER_SITE_OTHER): Payer: Medicare Other | Admitting: Cardiology

## 2013-06-20 ENCOUNTER — Encounter: Payer: Self-pay | Admitting: Cardiology

## 2013-06-20 VITALS — BP 160/70 | HR 73 | Wt 127.0 lb

## 2013-06-20 DIAGNOSIS — I359 Nonrheumatic aortic valve disorder, unspecified: Secondary | ICD-10-CM

## 2013-06-20 DIAGNOSIS — I350 Nonrheumatic aortic (valve) stenosis: Secondary | ICD-10-CM

## 2013-06-20 DIAGNOSIS — I10 Essential (primary) hypertension: Secondary | ICD-10-CM

## 2013-06-20 DIAGNOSIS — E785 Hyperlipidemia, unspecified: Secondary | ICD-10-CM

## 2013-06-20 LAB — BASIC METABOLIC PANEL
CO2: 26 mEq/L (ref 19–32)
Calcium: 9.5 mg/dL (ref 8.4–10.5)
Chloride: 103 mEq/L (ref 96–112)
Creatinine, Ser: 1.2 mg/dL (ref 0.4–1.2)
Potassium: 4 mEq/L (ref 3.5–5.1)

## 2013-06-20 LAB — CBC WITH DIFFERENTIAL/PLATELET
Basophils Relative: 0.4 % (ref 0.0–3.0)
Eosinophils Absolute: 0.2 10*3/uL (ref 0.0–0.7)
Eosinophils Relative: 2.1 % (ref 0.0–5.0)
HCT: 38.6 % (ref 36.0–46.0)
Hemoglobin: 13.1 g/dL (ref 12.0–15.0)
Lymphs Abs: 2.7 10*3/uL (ref 0.7–4.0)
MCHC: 33.8 g/dL (ref 30.0–36.0)
MCV: 86.4 fl (ref 78.0–100.0)
Monocytes Absolute: 0.9 10*3/uL (ref 0.1–1.0)
Monocytes Relative: 10.1 % (ref 3.0–12.0)
Neutro Abs: 5 10*3/uL (ref 1.4–7.7)
Neutrophils Relative %: 56.6 % (ref 43.0–77.0)
RBC: 4.47 Mil/uL (ref 3.87–5.11)
WBC: 8.8 10*3/uL (ref 4.5–10.5)

## 2013-06-20 LAB — PROTIME-INR: INR: 1.1 ratio — ABNORMAL HIGH (ref 0.8–1.0)

## 2013-06-20 NOTE — Assessment & Plan Note (Signed)
Continue statin. 

## 2013-06-20 NOTE — Assessment & Plan Note (Signed)
Blood pressure controlled. Continue present medications. 

## 2013-06-20 NOTE — Patient Instructions (Addendum)
Your physician recommends that you schedule a follow-up appointment in: TBD  Your physician has requested that you have a cardiac catheterization. Cardiac catheterization is used to diagnose and/or treat various heart conditions. Doctors may recommend this procedure for a number of different reasons. The most common reason is to evaluate chest pain. Chest pain can be a symptom of coronary artery disease (CAD), and cardiac catheterization can show whether plaque is narrowing or blocking your heart's arteries. This procedure is also used to evaluate the valves, as well as measure the blood flow and oxygen levels in different parts of your heart. For further information please visit [email or web address] Please follow instruction sheet, as given.   Your physician recommends that you HAVE LAB WORK TODAY  REFERRAL TO [facility] TO DISCUSS VALVE SURGERY

## 2013-06-20 NOTE — Assessment & Plan Note (Signed)
Patient is noted to have severe aortic stenosis on echocardiogram. In further discussions she has had progressive mild increased dyspnea on exertion. No chest pain or syncope. I think given her dyspnea we should proceed with aortic valve replacement and she agrees. Plan right and left cardiac catheterization as an outpatient. The risks and benefits were discussed and the patient agrees to proceed. We will arrange an outpatient evaluation by [facility] for consideration of aortic valve replacement +/- bypass as indicated.

## 2013-06-20 NOTE — Progress Notes (Signed)
    HPI: FU aortic stenosis. I initially saw her in September of 2014. Echocardiogram was performed at that time and showed normal LV function, moderate left ventricular hypertrophy, grade 1 diastolic dysfunction, severe aortic stenosis with a mean gradient of 46 mm of mercury. Nuclear study in October of 2014 showed an ejection fraction of 79% and normal perfusion. Since she was last seen, she does note dyspnea on exertion. No orthopnea, PND, pedal edema, palpitations, syncope or chest pain.  Current Outpatient Prescriptions  Medication Sig Dispense Refill  . acetaminophen (TYLENOL) 325 MG tablet Take 650 mg by mouth every 6 (six) hours as needed for pain.      . amLODipine (NORVASC) 5 MG tablet Take 5 mg by mouth daily.        . aspirin 81 MG tablet Take 160 mg by mouth daily.      . bimatoprost (LUMIGAN) 0.01 % SOLN Place 1 drop into both eyes at bedtime.        . Cholecalciferol (VITAMIN D-3 PO) Take 2,000 Units by mouth daily.       . fluticasone (FLOVENT HFA) 44 MCG/ACT inhaler Inhale 2 puffs into the lungs daily.      . metFORMIN (GLUCOPHAGE-XR) 500 MG 24 hr tablet Take 1,000 mg by mouth daily after supper.        . Multiple Vitamins-Minerals (OCUVITE-LUTEIN PO) Take 1 tablet by mouth daily.      . omeprazole (PRILOSEC) 20 MG capsule Take 10 mg by mouth at bedtime.       . pravastatin (PRAVACHOL) 40 MG tablet Take 40 mg by mouth daily.        . sertraline (ZOLOFT) 50 MG tablet Take 50 mg by mouth daily.        . trolamine salicylate (MYOFLEX) 10 % cream Apply 1 application topically as needed (for lower back pain).      . valsartan-hydrochlorothiazide (DIOVAN-HCT) 320-12.5 MG per tablet Take 1 tablet by mouth daily.       No current facility-administered medications for this visit.     Past Medical History  Diagnosis Date  . Hypertension   . Asthma     uses inhaler as needed  . Diabetes mellitus   . GERD (gastroesophageal reflux disease)   . Arthritis   . Anxiety   .  Depression   . Hyperlipidemia   . Emphysema of lung   . Glaucoma   . [name]   . Aortic stenosis     Past Surgical History  Procedure Laterality Date  . Ankle surgery  1998    d/t fx.  Left ankle  . Facial cosmetic surgery      face lift  . Nose surgery    . Laparoscopic assisted vaginal hysterectomy  06/21/2011    Procedure: LAPAROSCOPIC ASSISTED VAGINAL HYSTERECTOMY;  Surgeon: W Ronald Neal;  Location: [facility];  Service: Gynecology;  Laterality: N/A;  . Salpingoophorectomy  06/21/2011    Procedure: SALPINGO OOPHERECTOMY;  Surgeon: W Ronald Neal;  Location: [facility];  Service: Gynecology;  Laterality: Bilateral;  . Anterior and posterior repair  06/21/2011    Procedure: ANTERIOR (CYSTOCELE) AND POSTERIOR REPAIR (RECTOCELE);  Surgeon: W Ronald Neal;  Location: [facility];  Service: Gynecology;  Laterality: N/A;  . Vaginal prolapse repair  06/21/2011    Procedure: SACROPEXY;  Surgeon: W Ronald Neal;  Location: [facility];  Service: Gynecology;  Laterality: N/A;  sacrospinous ligament suspension    History   Social History  .   Marital Status: Married    Spouse Name: [name]    Number of Children: 1  . Years of Education: N/A   Occupational History  . retired secretary   .     Social History Main Topics  . Smoking status: Former Smoker -- 2.00 packs/day for 15 years    Types: Cigarettes    Quit date: 09/18/1972  . Smokeless tobacco: Never Used  . Alcohol Use: No  . Drug Use: No  . Sexual Activity: Not on file   Other Topics Concern  . Not on file   Social History Narrative  . No narrative on file    ROS: no fevers or chills, productive cough, hemoptysis, dysphasia, odynophagia, melena, hematochezia, dysuria, hematuria, rash, seizure activity, orthopnea, PND, pedal edema, claudication. Remaining systems are negative.  Physical Exam: Well-developed well-nourished in no acute distress.  Skin is warm and dry.  HEENT is normal.  Neck is supple.  Chest is clear  to auscultation with normal expansion.  Cardiovascular exam is regular rate and rhythm. 3/6 systolic murmur lower left sternal border radiating to the carotids. Abdominal exam nontender or distended. No masses palpated. Extremities show no edema. neuro grossly intact       

## 2013-06-23 ENCOUNTER — Encounter (HOSPITAL_COMMUNITY): Payer: Self-pay | Admitting: Pharmacy Technician

## 2013-06-25 ENCOUNTER — Telehealth: Payer: Self-pay | Admitting: Cardiology

## 2013-06-25 NOTE — Telephone Encounter (Signed)
New message   Questions about cath on fri

## 2013-06-25 NOTE — Telephone Encounter (Signed)
I spoke with the pt and answered all of her questions.

## 2013-06-26 MED ORDER — SODIUM CHLORIDE 0.9 % IJ SOLN: 3.0000 mL | INTRAMUSCULAR | Status: DC | PRN

## 2013-06-26 MED ORDER — SODIUM CHLORIDE 0.9 % IV SOLN: 250.0000 mL | INTRAVENOUS | Status: DC | PRN

## 2013-06-26 MED ORDER — SODIUM CHLORIDE 0.9 % IJ SOLN: 3.0000 mL | Freq: Two times a day (BID) | INTRAMUSCULAR | Status: DC

## 2013-06-26 MED ORDER — ASPIRIN 81 MG PO CHEW
81.0000 mg | CHEWABLE_TABLET | ORAL | Status: AC
  Administered 2013-06-27: 81 mg via ORAL

## 2013-06-26 MED ORDER — SODIUM CHLORIDE 0.9 % IV SOLN
1.0000 mL/kg/h | INTRAVENOUS | Status: DC
  Administered 2013-06-27: 1 mL/kg/h via INTRAVENOUS

## 2013-06-26 MED ORDER — DIAZEPAM 2 MG PO TABS
2.0000 mg | ORAL_TABLET | ORAL | Status: AC
  Administered 2013-06-27: 2 mg via ORAL

## 2013-06-27 ENCOUNTER — Encounter (HOSPITAL_COMMUNITY)
Admission: RE | Disposition: A | Discharge status: Home / Self Care | Payer: Self-pay | Source: Ambulatory Visit | Attending: Cardiovascular Disease

## 2013-06-27 ENCOUNTER — Ambulatory Visit (HOSPITAL_COMMUNITY)
Admission: RE | Admit: 2013-06-27 | Discharge: 2013-06-27 | Disposition: A | Discharge status: Home / Self Care | Payer: Medicare Other | Source: Ambulatory Visit | Attending: Cardiovascular Disease | Admitting: Cardiovascular Disease

## 2013-06-27 DIAGNOSIS — I359 Nonrheumatic aortic valve disorder, unspecified: Secondary | ICD-10-CM | POA: Insufficient documentation

## 2013-06-27 DIAGNOSIS — K219 Gastro-esophageal reflux disease without esophagitis: Secondary | ICD-10-CM | POA: Insufficient documentation

## 2013-06-27 DIAGNOSIS — F411 Generalized anxiety disorder: Secondary | ICD-10-CM | POA: Insufficient documentation

## 2013-06-27 DIAGNOSIS — J438 Other emphysema: Secondary | ICD-10-CM | POA: Insufficient documentation

## 2013-06-27 DIAGNOSIS — I2510 Atherosclerotic heart disease of native coronary artery without angina pectoris: Secondary | ICD-10-CM

## 2013-06-27 DIAGNOSIS — F3289 Other specified depressive episodes: Secondary | ICD-10-CM | POA: Insufficient documentation

## 2013-06-27 DIAGNOSIS — F329 Major depressive disorder, single episode, unspecified: Secondary | ICD-10-CM | POA: Insufficient documentation

## 2013-06-27 DIAGNOSIS — E785 Hyperlipidemia, unspecified: Secondary | ICD-10-CM | POA: Insufficient documentation

## 2013-06-27 DIAGNOSIS — E119 Type 2 diabetes mellitus without complications: Secondary | ICD-10-CM | POA: Insufficient documentation

## 2013-06-27 DIAGNOSIS — I10 Essential (primary) hypertension: Secondary | ICD-10-CM | POA: Insufficient documentation

## 2013-06-27 HISTORY — PX: LEFT AND RIGHT HEART CATHETERIZATION WITH CORONARY ANGIOGRAM: SHX5449

## 2013-06-27 LAB — POCT I-STAT 3, ART BLOOD GAS (G3+)
Acid-base deficit: 5 mmol/L — ABNORMAL HIGH (ref 0.0–2.0)
O2 Saturation: 97 %
TCO2: 23 mmol/L (ref 0–100)
pO2, Arterial: 100 mmHg (ref 80.0–100.0)

## 2013-06-27 LAB — GLUCOSE, CAPILLARY: Glucose-Capillary: 112 mg/dL — ABNORMAL HIGH (ref 70–99)

## 2013-06-27 LAB — POCT I-STAT 3, VENOUS BLOOD GAS (G3P V)
Acid-base deficit: 5 mmol/L — ABNORMAL HIGH (ref 0.0–2.0)
Bicarbonate: 21.9 mEq/L (ref 20.0–24.0)
TCO2: 23 mmol/L (ref 0–100)

## 2013-06-27 SURGERY — LEFT AND RIGHT HEART CATHETERIZATION WITH CORONARY ANGIOGRAM
Anesthesia: LOCAL

## 2013-06-27 MED ORDER — NITROGLYCERIN 0.2 MG/ML ON CALL CATH LAB
INTRAVENOUS | Status: AC
  Filled 2013-06-27: qty 1

## 2013-06-27 MED ORDER — ACETAMINOPHEN 325 MG PO TABS: 650.0000 mg | ORAL_TABLET | ORAL | Status: DC | PRN

## 2013-06-27 MED ORDER — DIAZEPAM 2 MG PO TABS
ORAL_TABLET | ORAL | Status: AC
  Filled 2013-06-27: qty 1

## 2013-06-27 MED ORDER — LIDOCAINE HCL (PF) 1 % IJ SOLN
INTRAMUSCULAR | Status: AC
  Filled 2013-06-27: qty 30

## 2013-06-27 MED ORDER — FENTANYL CITRATE 0.05 MG/ML IJ SOLN
INTRAMUSCULAR | Status: AC
  Filled 2013-06-27: qty 2

## 2013-06-27 MED ORDER — ASPIRIN 81 MG PO CHEW
CHEWABLE_TABLET | ORAL | Status: AC
  Filled 2013-06-27: qty 1

## 2013-06-27 MED ORDER — HEPARIN (PORCINE) IN NACL 2-0.9 UNIT/ML-% IJ SOLN
INTRAMUSCULAR | Status: AC
  Filled 2013-06-27: qty 1000

## 2013-06-27 MED ORDER — SODIUM CHLORIDE 0.9 % IV SOLN: INTRAVENOUS | Status: DC

## 2013-06-27 MED ORDER — ONDANSETRON HCL 4 MG/2ML IJ SOLN: 4.0000 mg | Freq: Four times a day (QID) | INTRAMUSCULAR | Status: DC | PRN

## 2013-06-27 MED ORDER — MIDAZOLAM HCL 2 MG/2ML IJ SOLN
INTRAMUSCULAR | Status: AC
  Filled 2013-06-27: qty 2

## 2013-06-27 NOTE — H&P (View-Only) (Signed)
HPI: FU aortic stenosis. I initially saw her in September of 2014. Echocardiogram was performed at that time and showed normal LV function, moderate left ventricular hypertrophy, grade 1 diastolic dysfunction, severe aortic stenosis with a mean gradient of 46 mm of mercury. Nuclear study in October of 2014 showed an ejection fraction of 79% and normal perfusion. Since she was last seen, she does note dyspnea on exertion. No orthopnea, PND, pedal edema, palpitations, syncope or chest pain.  Current Outpatient Prescriptions  Medication Sig Dispense Refill  . acetaminophen (TYLENOL) 325 MG tablet Take 650 mg by mouth every 6 (six) hours as needed for pain.      [name] amLODipine (NORVASC) 5 MG tablet Take 5 mg by mouth daily.        [name] aspirin 81 MG tablet Take 160 mg by mouth daily.      . bimatoprost (LUMIGAN) 0.01 % SOLN Place 1 drop into both eyes at bedtime.        . Cholecalciferol (VITAMIN D-3 PO) Take 2,000 Units by mouth daily.       . fluticasone (FLOVENT HFA) 44 MCG/ACT inhaler Inhale 2 puffs into the lungs daily.      . metFORMIN (GLUCOPHAGE-XR) 500 MG 24 hr tablet Take 1,000 mg by mouth daily after supper.        . Multiple Vitamins-Minerals (OCUVITE-LUTEIN PO) Take 1 tablet by mouth daily.      [name] omeprazole (PRILOSEC) 20 MG capsule Take 10 mg by mouth at bedtime.       . pravastatin (PRAVACHOL) 40 MG tablet Take 40 mg by mouth daily.        . sertraline (ZOLOFT) 50 MG tablet Take 50 mg by mouth daily.        [name] trolamine salicylate (MYOFLEX) 10 % cream Apply 1 application topically as needed (for lower back pain).      . valsartan-hydrochlorothiazide (DIOVAN-HCT) 320-12.5 MG per tablet Take 1 tablet by mouth daily.       No current facility-administered medications for this visit.     Past Medical History  Diagnosis Date  . Hypertension   . Asthma     uses inhaler as needed  . Diabetes mellitus   . GERD (gastroesophageal reflux disease)   . Arthritis   . Anxiety   .  Depression   . Hyperlipidemia   . Emphysema of lung   . Glaucoma   . [name]   . Aortic stenosis     Past Surgical History  Procedure Laterality Date  . Ankle surgery  1998    d/t fx.  Left ankle  . Facial cosmetic surgery      face lift  . Nose surgery    . Laparoscopic assisted vaginal hysterectomy  06/21/2011    Procedure: LAPAROSCOPIC ASSISTED VAGINAL HYSTERECTOMY;  Surgeon: Freddy Finner;  Location: [facility];  Service: Gynecology;  Laterality: N/A;  . Salpingoophorectomy  06/21/2011    Procedure: SALPINGO OOPHERECTOMY;  Surgeon: Freddy Finner;  Location: [facility];  Service: Gynecology;  Laterality: Bilateral;  . Anterior and posterior repair  06/21/2011    Procedure: ANTERIOR (CYSTOCELE) AND POSTERIOR REPAIR (RECTOCELE);  Surgeon: Freddy Finner;  Location: [facility];  Service: Gynecology;  Laterality: N/A;  . Vaginal prolapse repair  06/21/2011    Procedure: SACROPEXY;  Surgeon: Freddy Finner;  Location: [facility];  Service: Gynecology;  Laterality: N/A;  sacrospinous ligament suspension    History   Social History  .  Marital Status: Married    Spouse Name: [name]    Number of Children: 1  . Years of Education: N/A   Occupational History  . retired [employer] operational officer   .     Social History Main Topics  . Smoking status: Former Smoker -- 2.00 packs/day for 15 years    Types: Cigarettes    Quit date: 09/18/1972  . Smokeless tobacco: Never Used  . Alcohol Use: No  . Drug Use: No  . Sexual Activity: Not on file   Other Topics Concern  . Not on file   Social History Narrative  . No narrative on file    ROS: no fevers or chills, productive cough, hemoptysis, dysphasia, odynophagia, melena, hematochezia, dysuria, hematuria, rash, seizure activity, orthopnea, PND, pedal edema, claudication. Remaining systems are negative.  Physical Exam: Well-developed well-nourished in no acute distress.  Skin is warm and dry.  HEENT is normal.  Neck is supple.  Chest is clear  to auscultation with normal expansion.  Cardiovascular exam is regular rate and rhythm. 3/6 systolic murmur lower left sternal border radiating to the carotids. Abdominal exam nontender or distended. No masses palpated. Extremities show no edema. neuro grossly intact

## 2013-06-27 NOTE — Interval H&P Note (Signed)
History and Physical Interval Note:  06/27/2013 7:38 AM  [name]  has presented today for surgery, with the diagnosis of Chest pain  The various methods of treatment have been discussed with the patient and family. After consideration of risks, benefits and other options for treatment, the patient has consented to  Procedure(s): LEFT AND RIGHT HEART CATHETERIZATION WITH CORONARY ANGIOGRAM (N/A) as a surgical intervention .  The patient's history has been reviewed, patient examined, no change in status, stable for surgery.  I have reviewed the patient's chart and labs.  Questions were answered to the patient's satisfaction.     Elyn Aquas.

## 2013-06-27 NOTE — CV Procedure (Signed)
     Cardiac Cath Note  [name] [id number] 05/11/1932  Procedure: Right and Left  Heart Cardiac Catheterization Note Indications:  Aortic stenosis  Procedure Details Consent: Obtained Time Out: Verified patient identification, verified procedure, site/side was marked, verified correct patient position, special equipment/implants available, Radiology Safety Procedures followed,  medications/allergies/relevent history reviewed, required imaging and test results available.  Performed   Medications: Fentanyl: 50 mcg IV Versed: 2 mg IV  The right femoral artery and right femoral vein were easily canulated using a modified Seldinger technique.  Hemodynamics:   RA: 11/7 RV: 39/4 PCWP: 5/5/4 PA:  31/13 (23)  Cardiac Output   Thermodilution: 3.77  Index of 2.45  Fick : 3.19    Index 2.07  Arterial Sat:  97% PA Sat: 61%.  LV pressure: AV was not crossed ( known severe AS) Aortic pressure: 117/54  Angiography   Left Main: large, smooth and normal  Left anterior Descending:  Large vessel,  There is a long 70 % mid stenosis,  diags are small and have moderate disease  Left Circumflex: large,  Minor luminal irreg.  OM1, OM2 have minor luminal irreg.  Right Coronary Artery:  Moderate in size, mild irreg in the mid segment - possibly as tight as 40-50%.  PDA and PLSA are moderate in size and are normal.  LV Gram: not done  Complications: No apparent complications Patient did tolerate procedure well.  Contrast used: 35 cc  Conclusions:   1. CAD : primarily involving the mid LAD and to a lessor degree the mid RCA 2. Known aortic stenosis.  Will call [facility]   [name], [name]., MD, [facility] 06/27/2013, 8:16 AM Office - [phone] Pager [phone]

## 2013-07-01 ENCOUNTER — Other Ambulatory Visit: Payer: Self-pay | Admitting: *Deleted

## 2013-07-01 ENCOUNTER — Institutional Professional Consult (permissible substitution) (INDEPENDENT_AMBULATORY_CARE_PROVIDER_SITE_OTHER): Payer: Medicare Other | Admitting: Cardiothoracic Surgery

## 2013-07-01 ENCOUNTER — Encounter: Payer: Self-pay | Admitting: Cardiothoracic Surgery

## 2013-07-01 VITALS — BP 150/80 | HR 83 | Resp 20 | Ht 60.0 in | Wt 127.0 lb

## 2013-07-01 DIAGNOSIS — I359 Nonrheumatic aortic valve disorder, unspecified: Secondary | ICD-10-CM

## 2013-07-01 DIAGNOSIS — I350 Nonrheumatic aortic (valve) stenosis: Secondary | ICD-10-CM

## 2013-07-01 DIAGNOSIS — I2510 Atherosclerotic heart disease of native coronary artery without angina pectoris: Secondary | ICD-10-CM

## 2013-07-01 NOTE — Progress Notes (Signed)
PCP is [name], MD Referring Provider is Lewayne Bunting, MD  Chief Complaint  Patient presents with  . Aortic Stenosis    Surgical eval on severe aoritc stenosis, ECHO 06/12/13, Cardiac Cath 06/27/13, Nuclear med study 06/21/13    HPI: 79 year old Caucasian female followed for several years with serial 2-D echocardiograms by Dr. Jens Som for aortic stenosis. Previously she has been asymptomatic and her aortic stenosis has been mild to moderate. On the last echocardiogram she is noted to have significant increase in gradient to severe aortic stenosis and has developed dyspnea on exertion. Mean gradient was 48 mmHg with peak gradient 76 mmHg. LV systolic function is normal. No significant MR or TR. Because of symptomatic severe aortic stenosis CT surgical evaluation was requested.  Patient denies family history of heart valve disease. Patient has had total dental extraction and has full plates. Patient denies resting shortness of breath, presyncope, edema or PND. Patient's mother died of a ruptured abdominal aortic aneurysm.  Patient has diabetes mellitus, creatinine baseline 1.4 Patient has COPD-emphysema from remote history of smoking. DLCO is 30%. FEV1 is 50%  Patient has had a left ankle fracture treated with internal fixation 10 years ago.  Past Medical History  Diagnosis Date  . Hypertension   . Asthma     uses inhaler as needed  . Diabetes mellitus   . GERD (gastroesophageal reflux disease)   . Arthritis   . Anxiety   . Depression   . Hyperlipidemia   . Emphysema of lung   . Glaucoma   . [name]   . Aortic stenosis     Past Surgical History  Procedure Laterality Date  . Ankle surgery  1998    d/t fx.  Left ankle  . Facial cosmetic surgery      face lift  . Nose surgery    . Laparoscopic assisted vaginal hysterectomy  06/21/2011    Procedure: LAPAROSCOPIC ASSISTED VAGINAL HYSTERECTOMY;  Surgeon: Freddy Finner;  Location: [facility];  Service:  Gynecology;  Laterality: N/A;  . Salpingoophorectomy  06/21/2011    Procedure: SALPINGO OOPHERECTOMY;  Surgeon: Freddy Finner;  Location: [facility];  Service: Gynecology;  Laterality: Bilateral;  . Anterior and posterior repair  06/21/2011    Procedure: ANTERIOR (CYSTOCELE) AND POSTERIOR REPAIR (RECTOCELE);  Surgeon: Freddy Finner;  Location: [facility];  Service: Gynecology;  Laterality: N/A;  . Vaginal prolapse repair  06/21/2011    Procedure: SACROPEXY;  Surgeon: Freddy Finner;  Location: [facility];  Service: Gynecology;  Laterality: N/A;  sacrospinous ligament suspension    Family History  Problem Relation Age of Onset  . Emphysema Father   . Colon cancer Neg Hx   . Esophageal cancer Neg Hx   . Rectal cancer Neg Hx   . Stomach cancer Neg Hx     Social History History  Substance Use Topics  . Smoking status: Former Smoker -- 2.00 packs/day for 15 years    Types: Cigarettes    Quit date: 09/18/1972  . Smokeless tobacco: Never Used  . Alcohol Use: No    Current Outpatient Prescriptions  Medication Sig Dispense Refill  . acetaminophen (TYLENOL) 325 MG tablet Take 650 mg by mouth as needed for pain.       [name] amLODipine (NORVASC) 5 MG tablet Take 5 mg by mouth daily.       [name] aspirin 325 MG tablet Take 650 mg by mouth as needed for pain.      [name]  aspirin 81 MG tablet Take 81 mg by mouth at bedtime.       . bimatoprost (LUMIGAN) 0.01 % SOLN Place 1 drop into both eyes at bedtime.       . Cholecalciferol (VITAMIN D-3 PO) Take 2,000 Units by mouth at bedtime.       . fluticasone (FLOVENT HFA) 44 MCG/ACT inhaler Inhale 2 puffs into the lungs daily.      . metFORMIN (GLUCOPHAGE-XR) 500 MG 24 hr tablet Take 1,000 mg by mouth daily after supper.       . Multiple Vitamins-Minerals (OCUVITE-LUTEIN PO) Take 1 tablet by mouth every morning.       [name] omeprazole (PRILOSEC) 20 MG capsule Take 20 mg by mouth at bedtime.       . pravastatin (PRAVACHOL) 40 MG tablet Take 40 mg by mouth daily.       . sertraline  (ZOLOFT) 50 MG tablet Take 50 mg by mouth every morning.       . trolamine salicylate (MYOFLEX) 10 % cream Apply 1 application topically as needed (for lower back pain).      . valsartan-hydrochlorothiazide (DIOVAN-HCT) 320-12.5 MG per tablet Take 1 tablet by mouth every morning.        No current facility-administered medications for this visit.    No Known Allergies  Review of Systems Mild weight loss this summer no fever or night sweats HEENT review negative for visual changes or dental complaints. Patient has had previous bilateral retinal surgery for diabetes Thorax without history of fracture trauma or pneumothorax Cardiac-aortic stenosis no other valvular disease. Cardiac catheterization last week shows no significant CAD, right heart data within parameters. Diabetes fairly well controlled on oral metformin No history DVT claudication TIA or CVA No bleeding complications or coagulopathy Right-hand dominant  BP 150/80  Pulse 83  Resp 20  Ht 5' (1.524 m)  Wt 127 lb (57.607 kg)  BMI 24.8 kg/m2  SpO2 95% Physical Exam   Gen.-Alert oriented no acute distress accompanied by her husband HEENT-normocephalic dentition good pupils equal Neck-no JVD mass or adenopathy Thorax-no deformity or tenderness, breath sounds clear and equal Cardiac-regular rhythm grade 3/6 systolic ejection murmur Abdomen-soft nontender without pulsatile mass, no organomegaly Extremities-no clubbing cyanosis edema or tenderness Vascular-palpable pulses in both upper extremities both carotids no palpable pedal pulses. Small hematoma with ecchymoses of right groin from cardiac cath Neuro-no focal motor deficit alert and oriented  Diagnostic Tests: 2-D echocardiogram, cardiac catheterization, chest x-ray is reviewed.  Impression: Severe aortic stenosis, symptomatic class III symptoms of CHF. 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 We'll get CT scan to evaluate  prior to setting date for aVR. If she has a porcelain aorta then TAVR may be best option.  Plan: Proceed with CTA of the thoracic aorta and returned office on October 17.

## 2013-07-04 ENCOUNTER — Ambulatory Visit: Payer: Medicare Other | Admitting: Cardiothoracic Surgery

## 2013-07-04 ENCOUNTER — Ambulatory Visit
Admission: RE | Admit: 2013-07-04 | Discharge: 2013-07-04 | Disposition: A | Discharge status: Home / Self Care | Payer: Medicare Other | Source: Ambulatory Visit | Attending: Cardiothoracic Surgery | Admitting: Cardiothoracic Surgery

## 2013-07-04 DIAGNOSIS — I359 Nonrheumatic aortic valve disorder, unspecified: Secondary | ICD-10-CM

## 2013-07-04 MED ORDER — IOHEXOL 350 MG/ML SOLN
80.0000 mL | Freq: Once | INTRAVENOUS | Status: AC | PRN
  Administered 2013-07-04: 80 mL via INTRAVENOUS

## 2013-07-07 ENCOUNTER — Encounter: Payer: Self-pay | Admitting: Cardiothoracic Surgery

## 2013-07-07 ENCOUNTER — Other Ambulatory Visit: Payer: Self-pay | Admitting: *Deleted

## 2013-07-07 ENCOUNTER — Ambulatory Visit (INDEPENDENT_AMBULATORY_CARE_PROVIDER_SITE_OTHER): Payer: Medicare Other | Admitting: Cardiothoracic Surgery

## 2013-07-07 ENCOUNTER — Other Ambulatory Visit: Payer: Self-pay

## 2013-07-07 VITALS — BP 140/70 | HR 88 | Resp 20 | Ht 60.0 in | Wt 127.0 lb

## 2013-07-07 DIAGNOSIS — I2510 Atherosclerotic heart disease of native coronary artery without angina pectoris: Secondary | ICD-10-CM

## 2013-07-07 DIAGNOSIS — I350 Nonrheumatic aortic (valve) stenosis: Secondary | ICD-10-CM

## 2013-07-07 DIAGNOSIS — I359 Nonrheumatic aortic valve disorder, unspecified: Secondary | ICD-10-CM

## 2013-07-07 NOTE — Progress Notes (Signed)
PCP is [name], MD Referring Provider is Lewayne Bunting, MD  Chief Complaint  Patient presents with  . Aortic Stenosis    Further discuss surgery S/P CTA Chest 07/04/13  . Coronary Artery Disease    HPI: Severe calcified aortic stenosis with class III CHF         70% stenosis of the mid LAD          COPD with FEV1.3 and DLCO 30%, remote smoker  Patient returns for followup and discussion of her aortic stenosis. Chest CT scan showed moderate emphysema. The ascending aorta was not heavily calcified however the aortic valve was heavily calcified. Based on the findings at CT scan the patient should be a appropriate candidate for conventional aVR combined CABG to LAD.  Since the last office visit the patient has developed a sore throat and nonproductive cough. She was assessed at urgent care yesterday and was felt to have a viral pharyngitis. Her cough was controlled with Mucinex. Her throat remains somewhat scratchy. She is afebrile. Past Medical History  Diagnosis Date  . Hypertension   . Asthma     uses inhaler as needed  . Diabetes mellitus   . GERD (gastroesophageal reflux disease)   . Arthritis   . Anxiety   . Depression   . Hyperlipidemia   . Emphysema of lung   . Glaucoma   . [name]   . Aortic stenosis     Past Surgical History  Procedure Laterality Date  . Ankle surgery  1998    d/t fx.  Left ankle  . Facial cosmetic surgery      face lift  . Nose surgery    . Laparoscopic assisted vaginal hysterectomy  06/21/2011    Procedure: LAPAROSCOPIC ASSISTED VAGINAL HYSTERECTOMY;  Surgeon: Freddy Finner;  Location: [facility];  Service: Gynecology;  Laterality: N/A;  . Salpingoophorectomy  06/21/2011    Procedure: SALPINGO OOPHERECTOMY;  Surgeon: Freddy Finner;  Location: [facility];  Service: Gynecology;  Laterality: Bilateral;  . Anterior and posterior repair  06/21/2011    Procedure: ANTERIOR (CYSTOCELE) AND POSTERIOR REPAIR (RECTOCELE);  Surgeon: Freddy Finner;  Location: [facility];  Service: Gynecology;  Laterality: N/A;  . Vaginal prolapse repair  06/21/2011    Procedure: SACROPEXY;  Surgeon: Freddy Finner;  Location: [facility];  Service: Gynecology;  Laterality: N/A;  sacrospinous ligament suspension    Family History  Problem Relation Age of Onset  . Emphysema Father   . Colon cancer Neg Hx   . Esophageal cancer Neg Hx   . Rectal cancer Neg Hx   . Stomach cancer Neg Hx     Social History History  Substance Use Topics  . Smoking status: Former Smoker -- 2.00 packs/day for 15 years    Types: Cigarettes    Quit date: 09/18/1972  . Smokeless tobacco: Never Used  . Alcohol Use: No    Current Outpatient Prescriptions  Medication Sig Dispense Refill  . promethazine-codeine (PHENERGAN WITH CODEINE) 6.25-10 MG/5ML syrup Take 5 mLs by mouth every 4 (four) hours as needed for cough.      [name] acetaminophen (TYLENOL) 325 MG tablet Take 650 mg by mouth as needed for pain.       [name] amLODipine (NORVASC) 5 MG tablet Take 5 mg by mouth daily.       [name] aspirin 325 MG tablet Take 650 mg by mouth as needed for pain.      [name] aspirin 81  MG tablet Take 81 mg by mouth at bedtime.       . bimatoprost (LUMIGAN) 0.01 % SOLN Place 1 drop into both eyes at bedtime.       . Cholecalciferol (VITAMIN D-3 PO) Take 2,000 Units by mouth at bedtime.       . fluticasone (FLOVENT HFA) 44 MCG/ACT inhaler Inhale 2 puffs into the lungs daily.      . metFORMIN (GLUCOPHAGE-XR) 500 MG 24 hr tablet Take 1,000 mg by mouth daily after supper.       . Multiple Vitamins-Minerals (OCUVITE-LUTEIN PO) Take 1 tablet by mouth every morning.       [name] omeprazole (PRILOSEC) 20 MG capsule Take 20 mg by mouth at bedtime.       . pravastatin (PRAVACHOL) 40 MG tablet Take 40 mg by mouth daily.       . sertraline (ZOLOFT) 50 MG tablet Take 50 mg by mouth every morning.       . trolamine salicylate (MYOFLEX) 10 % cream Apply 1 application topically as needed (for lower back pain).      .  valsartan-hydrochlorothiazide (DIOVAN-HCT) 320-12.5 MG per tablet Take 1 tablet by mouth every morning.        No current facility-administered medications for this visit.    No Known Allergies  Review of Systems dyspnea on exertion unchanged no chest pain  BP 140/70  Pulse 88  Resp 20  Ht 5' (1.524 m)  Wt 127 lb (57.607 kg)  BMI 24.8 kg/m2  SpO2 95% Physical Exam Alert and comfortable Lungs clear Voice somewhat hoarse-scratchy For full pulses intact Regular heart rhythm, 2/6 systolic ejection murmur  Diagnostic Tests: CT of the thoracic aorta shows mild calcification, no contraindication to aortotomy and aVR.  Impression: We'll schedule aVR-CABG in a week to allow for viral syndrome  resolve. We'll repeat PFTs as part of preoperative assessment. Procedure indications and risks discussed in detail the patient and husband including risks of pulmonary problems requiring oxygen supplementation.  Plan: AVR-CABG x1 October 28 using a bioprosthetic valve and left IMA to LAD

## 2013-07-11 ENCOUNTER — Ambulatory Visit (HOSPITAL_COMMUNITY)
Admission: RE | Admit: 2013-07-11 | Discharge: 2013-07-11 | Disposition: A | Discharge status: Home / Self Care | Payer: Medicare Other | Source: Ambulatory Visit | Attending: Cardiothoracic Surgery | Admitting: Cardiothoracic Surgery

## 2013-07-11 ENCOUNTER — Encounter (HOSPITAL_COMMUNITY)
Admission: RE | Admit: 2013-07-11 | Discharge: 2013-07-11 | Disposition: A | Discharge status: Home / Self Care | Payer: Medicare Other | Source: Ambulatory Visit | Attending: Cardiothoracic Surgery | Admitting: Cardiothoracic Surgery

## 2013-07-11 ENCOUNTER — Encounter (HOSPITAL_COMMUNITY): Payer: Self-pay

## 2013-07-11 VITALS — BP 128/73 | HR 86 | Temp 98.0°F | Resp 18 | Ht 60.0 in | Wt 130.0 lb

## 2013-07-11 DIAGNOSIS — I359 Nonrheumatic aortic valve disorder, unspecified: Secondary | ICD-10-CM

## 2013-07-11 DIAGNOSIS — I2510 Atherosclerotic heart disease of native coronary artery without angina pectoris: Secondary | ICD-10-CM

## 2013-07-11 DIAGNOSIS — Z01812 Encounter for preprocedural laboratory examination: Secondary | ICD-10-CM | POA: Insufficient documentation

## 2013-07-11 DIAGNOSIS — J438 Other emphysema: Secondary | ICD-10-CM | POA: Insufficient documentation

## 2013-07-11 DIAGNOSIS — K449 Diaphragmatic hernia without obstruction or gangrene: Secondary | ICD-10-CM | POA: Insufficient documentation

## 2013-07-11 DIAGNOSIS — Z01810 Encounter for preprocedural cardiovascular examination: Secondary | ICD-10-CM

## 2013-07-11 DIAGNOSIS — Z01818 Encounter for other preprocedural examination: Secondary | ICD-10-CM | POA: Insufficient documentation

## 2013-07-11 DIAGNOSIS — M47814 Spondylosis without myelopathy or radiculopathy, thoracic region: Secondary | ICD-10-CM | POA: Insufficient documentation

## 2013-07-11 LAB — SURGICAL PCR SCREEN
MRSA, PCR: NEGATIVE
Staphylococcus aureus: POSITIVE — AB

## 2013-07-11 LAB — HEMOGLOBIN A1C
Hgb A1c MFr Bld: 6.6 % — ABNORMAL HIGH (ref <5.7)
Mean Plasma Glucose: 143 mg/dL — ABNORMAL HIGH (ref <117)

## 2013-07-11 LAB — COMPREHENSIVE METABOLIC PANEL
ALT: 12 U/L (ref 0–35)
AST: 19 U/L (ref 0–37)
Albumin: 3.8 g/dL (ref 3.5–5.2)
Alkaline Phosphatase: 70 U/L (ref 39–117)
BUN: 28 mg/dL — ABNORMAL HIGH (ref 6–23)
CO2: 16 mEq/L — ABNORMAL LOW (ref 19–32)
Calcium: 8.9 mg/dL (ref 8.4–10.5)
Chloride: 102 mEq/L (ref 96–112)
Creatinine, Ser: 1.19 mg/dL — ABNORMAL HIGH (ref 0.50–1.10)
GFR calc Af Amer: 49 mL/min — ABNORMAL LOW (ref >90)
GFR calc non Af Amer: 43 mL/min — ABNORMAL LOW (ref >90)
Glucose, Bld: 252 mg/dL — ABNORMAL HIGH (ref 70–99)
Potassium: 4.2 mEq/L (ref 3.5–5.1)
Sodium: 136 mEq/L (ref 135–145)
Total Bilirubin: 0.2 mg/dL — ABNORMAL LOW (ref 0.3–1.2)
Total Protein: 7.6 g/dL (ref 6.0–8.3)

## 2013-07-11 LAB — CBC
HCT: 36 % (ref 36.0–46.0)
Hemoglobin: 11.8 g/dL — ABNORMAL LOW (ref 12.0–15.0)
MCH: 29.2 pg (ref 26.0–34.0)
MCHC: 32.8 g/dL (ref 30.0–36.0)
MCV: 89.1 fL (ref 78.0–100.0)
Platelets: 263 10*3/uL (ref 150–400)
RBC: 4.04 MIL/uL (ref 3.87–5.11)
RDW: 14 % (ref 11.5–15.5)
WBC: 9.2 10*3/uL (ref 4.0–10.5)

## 2013-07-11 LAB — BLOOD GAS, ARTERIAL
Acid-base deficit: 5.5 mmol/L — ABNORMAL HIGH (ref 0.0–2.0)
Bicarbonate: 18.7 mEq/L — ABNORMAL LOW (ref 20.0–24.0)
Drawn by: 206361
FIO2: 0.21 %
O2 Saturation: 92.6 %
Patient temperature: 98.6
TCO2: 19.7 mmol/L (ref 0–100)
pCO2 arterial: 32.2 mmHg — ABNORMAL LOW (ref 35.0–45.0)
pH, Arterial: 7.382 (ref 7.350–7.450)
pO2, Arterial: 65.1 mmHg — ABNORMAL LOW (ref 80.0–100.0)

## 2013-07-11 LAB — URINALYSIS, ROUTINE W REFLEX MICROSCOPIC
Bilirubin Urine: NEGATIVE
Glucose, UA: NEGATIVE mg/dL
Hgb urine dipstick: NEGATIVE
Ketones, ur: NEGATIVE mg/dL
Leukocytes, UA: NEGATIVE
Nitrite: NEGATIVE
Protein, ur: NEGATIVE mg/dL
Specific Gravity, Urine: >1.03 — ABNORMAL HIGH (ref 1.005–1.030)
Urobilinogen, UA: 0.2 mg/dL (ref 0.0–1.0)
pH: 5.5 (ref 5.0–8.0)

## 2013-07-11 LAB — PULMONARY FUNCTION TEST

## 2013-07-11 LAB — ABO/RH: ABO/RH(D): A POS

## 2013-07-11 LAB — PROTIME-INR
INR: 1.07 (ref 0.00–1.49)
Prothrombin Time: 13.7 seconds (ref 11.6–15.2)

## 2013-07-11 LAB — APTT: aPTT: 33 seconds (ref 24–37)

## 2013-07-11 MED ORDER — ALBUTEROL SULFATE (5 MG/ML) 0.5% IN NEBU
2.5000 mg | INHALATION_SOLUTION | Freq: Once | RESPIRATORY_TRACT | Status: AC
  Administered 2013-07-11: 2.5 mg via RESPIRATORY_TRACT

## 2013-07-11 NOTE — Progress Notes (Signed)
VASCULAR LAB PRELIMINARY  PRELIMINARY  PRELIMINARY  PRELIMINARY  Pre-op Cardiac Surgery  Carotid Findings: Bilateral:  1-39% ICA stenosis.  Vertebral artery flow is antegrade.     Upper Extremity Right Left  Brachial Pressures 131 Triphasic 131 Triphasic  Radial Waveforms Triphasic Triphasic  Ulnar Waveforms Biphasic Biphasic  Palmar Arch (Allen's Test) Normal Normal   Findings:  Doppler waveforms remained normal bilaterally with both radial and ulnar compressions.    Lower  Extremity Right Left  Dorsalis Pedis 149 Biphasic 155 Biphasic  Posterior Tibial 164 Biphasic 168 Biphasic  Ankle/Brachial Indices 1.25 1.28    Findings:  ABIs and Doppler waveforms are within normal limits bilaterally at rest   Jakera Beaupre, RVS 07/11/2013, 7:08 PM

## 2013-07-11 NOTE — Pre-Procedure Instructions (Signed)
[name]  07/11/2013   Your procedure is scheduled on:  07/17/13  Report to [facility]  2 * 3 at 530 AM.  Call this number if you have problems the morning of surgery: [phone]   Remember:   Do not eat food or drink liquids after midnight.   Take these medicines the morning of surgery with A SIP OF WATER: norvasc,all inhales,prilosec,zoloft   Do not wear jewelry, make-up or nail polish.  Do not wear lotions, powders, or perfumes. You may wear deodorant.  Do not shave 48 hours prior to surgery. Men may shave face and neck.  Do not bring valuables to the hospital.  [facility] is not responsible                  for any belongings or valuables.               Contacts, dentures or bridgework may not be worn into surgery.  Leave suitcase in the car. After surgery it may be brought to your room.  For patients admitted to the hospital, discharge time is determined by your                treatment team.               Patients discharged the day of surgery will not be allowed to drive  home.  Name and phone number of your driver: family  Special Instructions: Shower using CHG 2 nights before surgery and the night before surgery.  If you shower the day of surgery use CHG.  Use special wash - you have one bottle of CHG for all showers.  You should use approximately 1/3 of the bottle for each shower.   Please read over the following fact sheets that you were given: Pain Booklet, Coughing and Deep Breathing, Blood Transfusion Information, MRSA Information and Surgical Site Infection Prevention

## 2013-07-14 ENCOUNTER — Other Ambulatory Visit: Payer: Self-pay | Admitting: *Deleted

## 2013-07-14 DIAGNOSIS — B958 Unspecified staphylococcus as the cause of diseases classified elsewhere: Secondary | ICD-10-CM

## 2013-07-14 MED ORDER — MUPIROCIN 2 % EX OINT: TOPICAL_OINTMENT | Freq: Two times a day (BID) | CUTANEOUS | Status: DC

## 2013-07-16 MED ORDER — SODIUM CHLORIDE 0.9 % IV SOLN
INTRAVENOUS | Status: DC
  Filled 2013-07-16 (×2): qty 30

## 2013-07-16 MED ORDER — POTASSIUM CHLORIDE 2 MEQ/ML IV SOLN
80.0000 meq | INTRAVENOUS | Status: DC
  Filled 2013-07-16: qty 40

## 2013-07-16 MED ORDER — NITROGLYCERIN IN D5W 200-5 MCG/ML-% IV SOLN
2.0000 ug/min | INTRAVENOUS | Status: AC
  Administered 2013-07-17: 5 ug/min via INTRAVENOUS
  Filled 2013-07-16: qty 250

## 2013-07-16 MED ORDER — SODIUM CHLORIDE 0.9 % IV SOLN
INTRAVENOUS | Status: AC
  Administered 2013-07-17: 69.8 mL/h via INTRAVENOUS
  Filled 2013-07-16: qty 40

## 2013-07-16 MED ORDER — VANCOMYCIN HCL 10 G IV SOLR
1250.0000 mg | INTRAVENOUS | Status: AC
  Administered 2013-07-17: 1250 mg via INTRAVENOUS
  Filled 2013-07-16: qty 1250

## 2013-07-16 MED ORDER — DEXTROSE 5 % IV SOLN
1.5000 g | INTRAVENOUS | Status: AC
  Administered 2013-07-17: 1.5 g via INTRAVENOUS
  Administered 2013-07-17: .75 g via INTRAVENOUS
  Filled 2013-07-16: qty 1.5

## 2013-07-16 MED ORDER — EPINEPHRINE HCL 1 MG/ML IJ SOLN
0.5000 ug/min | INTRAVENOUS | Status: DC
  Filled 2013-07-16: qty 4

## 2013-07-16 MED ORDER — MAGNESIUM SULFATE 50 % IJ SOLN
40.0000 meq | INTRAMUSCULAR | Status: DC
  Filled 2013-07-16: qty 10

## 2013-07-16 MED ORDER — DEXMEDETOMIDINE HCL IN NACL 400 MCG/100ML IV SOLN
0.1000 ug/kg/h | INTRAVENOUS | Status: AC
  Administered 2013-07-17: 0.2 ug/kg/h via INTRAVENOUS
  Filled 2013-07-16: qty 100

## 2013-07-16 MED ORDER — DOPAMINE-DEXTROSE 3.2-5 MG/ML-% IV SOLN
2.0000 ug/kg/min | INTRAVENOUS | Status: DC
  Filled 2013-07-16: qty 250

## 2013-07-16 MED ORDER — PLASMA-LYTE 148 IV SOLN
INTRAVENOUS | Status: AC
  Administered 2013-07-17: 10:00:00
  Filled 2013-07-16: qty 2.5

## 2013-07-16 MED ORDER — SODIUM CHLORIDE 0.9 % IV SOLN
INTRAVENOUS | Status: AC
  Administered 2013-07-17: 1 [IU]/h via INTRAVENOUS
  Filled 2013-07-16: qty 1

## 2013-07-16 MED ORDER — PHENYLEPHRINE HCL 10 MG/ML IJ SOLN
30.0000 ug/min | INTRAMUSCULAR | Status: AC
  Administered 2013-07-17: 10 ug/min via INTRAVENOUS
  Filled 2013-07-16: qty 2

## 2013-07-16 MED ORDER — DEXTROSE 5 % IV SOLN
750.0000 mg | INTRAVENOUS | Status: DC
  Filled 2013-07-16: qty 750

## 2013-07-17 ENCOUNTER — Inpatient Hospital Stay (HOSPITAL_COMMUNITY): Payer: Medicare Other

## 2013-07-17 ENCOUNTER — Encounter (HOSPITAL_COMMUNITY)
Admission: RE | Disposition: A | Discharge status: Rehabilitation Facility | Payer: Medicare Other | Source: Ambulatory Visit | Attending: Cardiothoracic Surgery

## 2013-07-17 ENCOUNTER — Encounter (HOSPITAL_COMMUNITY): Payer: Self-pay | Admitting: *Deleted

## 2013-07-17 ENCOUNTER — Encounter (HOSPITAL_COMMUNITY): Payer: Medicare Other | Admitting: Anesthesiology

## 2013-07-17 ENCOUNTER — Inpatient Hospital Stay (HOSPITAL_COMMUNITY): Payer: Medicare Other | Admitting: Anesthesiology

## 2013-07-17 ENCOUNTER — Inpatient Hospital Stay (HOSPITAL_COMMUNITY)
Admission: RE | Admit: 2013-07-17 | Discharge: 2013-07-24 | DRG: 220 | Disposition: A | Discharge status: Rehabilitation Facility | Payer: Medicare Other | Source: Ambulatory Visit | Attending: Cardiothoracic Surgery | Admitting: Cardiothoracic Surgery

## 2013-07-17 DIAGNOSIS — E119 Type 2 diabetes mellitus without complications: Secondary | ICD-10-CM | POA: Diagnosis present

## 2013-07-17 DIAGNOSIS — Z952 Presence of prosthetic heart valve: Secondary | ICD-10-CM

## 2013-07-17 DIAGNOSIS — I509 Heart failure, unspecified: Secondary | ICD-10-CM | POA: Diagnosis present

## 2013-07-17 DIAGNOSIS — I359 Nonrheumatic aortic valve disorder, unspecified: Secondary | ICD-10-CM

## 2013-07-17 DIAGNOSIS — K589 Irritable bowel syndrome without diarrhea: Secondary | ICD-10-CM | POA: Diagnosis present

## 2013-07-17 DIAGNOSIS — I350 Nonrheumatic aortic (valve) stenosis: Secondary | ICD-10-CM | POA: Diagnosis present

## 2013-07-17 DIAGNOSIS — R262 Difficulty in walking, not elsewhere classified: Secondary | ICD-10-CM | POA: Diagnosis not present

## 2013-07-17 DIAGNOSIS — R4182 Altered mental status, unspecified: Secondary | ICD-10-CM | POA: Diagnosis not present

## 2013-07-17 DIAGNOSIS — Z87891 Personal history of nicotine dependence: Secondary | ICD-10-CM

## 2013-07-17 DIAGNOSIS — J438 Other emphysema: Secondary | ICD-10-CM | POA: Diagnosis present

## 2013-07-17 DIAGNOSIS — Z79899 Other long term (current) drug therapy: Secondary | ICD-10-CM

## 2013-07-17 DIAGNOSIS — F329 Major depressive disorder, single episode, unspecified: Secondary | ICD-10-CM | POA: Diagnosis not present

## 2013-07-17 DIAGNOSIS — R5381 Other malaise: Secondary | ICD-10-CM | POA: Diagnosis not present

## 2013-07-17 DIAGNOSIS — K219 Gastro-esophageal reflux disease without esophagitis: Secondary | ICD-10-CM | POA: Diagnosis present

## 2013-07-17 DIAGNOSIS — I2510 Atherosclerotic heart disease of native coronary artery without angina pectoris: Secondary | ICD-10-CM

## 2013-07-17 DIAGNOSIS — I700 Atherosclerosis of aorta: Secondary | ICD-10-CM | POA: Diagnosis present

## 2013-07-17 DIAGNOSIS — F411 Generalized anxiety disorder: Secondary | ICD-10-CM | POA: Diagnosis not present

## 2013-07-17 DIAGNOSIS — H409 Unspecified glaucoma: Secondary | ICD-10-CM | POA: Diagnosis present

## 2013-07-17 DIAGNOSIS — E785 Hyperlipidemia, unspecified: Secondary | ICD-10-CM | POA: Diagnosis present

## 2013-07-17 DIAGNOSIS — F3289 Other specified depressive episodes: Secondary | ICD-10-CM | POA: Diagnosis not present

## 2013-07-17 DIAGNOSIS — Z951 Presence of aortocoronary bypass graft: Secondary | ICD-10-CM

## 2013-07-17 DIAGNOSIS — Z836 Family history of other diseases of the respiratory system: Secondary | ICD-10-CM

## 2013-07-17 DIAGNOSIS — Z7982 Long term (current) use of aspirin: Secondary | ICD-10-CM

## 2013-07-17 DIAGNOSIS — I10 Essential (primary) hypertension: Secondary | ICD-10-CM | POA: Diagnosis present

## 2013-07-17 DIAGNOSIS — F99 Mental disorder, not otherwise specified: Secondary | ICD-10-CM | POA: Diagnosis not present

## 2013-07-17 DIAGNOSIS — R1310 Dysphagia, unspecified: Secondary | ICD-10-CM | POA: Diagnosis not present

## 2013-07-17 HISTORY — PX: INTRAOPERATIVE TRANSESOPHAGEAL ECHOCARDIOGRAM: SHX5062

## 2013-07-17 HISTORY — PX: CORONARY ARTERY BYPASS GRAFT: SHX141

## 2013-07-17 HISTORY — PX: AORTIC VALVE REPLACEMENT: SHX41

## 2013-07-17 LAB — POCT I-STAT 3, ART BLOOD GAS (G3+)
Acid-base deficit: 3 mmol/L — ABNORMAL HIGH (ref 0.0–2.0)
Acid-base deficit: 5 mmol/L — ABNORMAL HIGH (ref 0.0–2.0)
Acid-base deficit: 4 mmol/L — ABNORMAL HIGH (ref 0.0–2.0)
Acid-base deficit: 4 mmol/L — ABNORMAL HIGH (ref 0.0–2.0)
Acid-base deficit: 5 mmol/L — ABNORMAL HIGH (ref 0.0–2.0)
Acid-base deficit: 5 mmol/L — ABNORMAL HIGH (ref 0.0–2.0)
Acid-base deficit: 4 mmol/L — ABNORMAL HIGH (ref 0.0–2.0)
Bicarbonate: 22.4 mEq/L (ref 20.0–24.0)
Bicarbonate: 21.9 mEq/L (ref 20.0–24.0)
Bicarbonate: 23.9 mEq/L (ref 20.0–24.0)
Bicarbonate: 22.2 mEq/L (ref 20.0–24.0)
Bicarbonate: 20.7 mEq/L (ref 20.0–24.0)
Bicarbonate: 20.9 mEq/L (ref 20.0–24.0)
Bicarbonate: 21 mEq/L (ref 20.0–24.0)
O2 Saturation: 100 %
O2 Saturation: 97 %
O2 Saturation: 83 %
O2 Saturation: 95 %
O2 Saturation: 90 %
O2 Saturation: 87 %
O2 Saturation: 91 %
Patient temperature: 35.9
Patient temperature: 35.5
Patient temperature: 36.1
Patient temperature: 36
Patient temperature: 36.6
TCO2: 24 mmol/L (ref 0–100)
TCO2: 23 mmol/L (ref 0–100)
TCO2: 26 mmol/L (ref 0–100)
TCO2: 23 mmol/L (ref 0–100)
TCO2: 22 mmol/L (ref 0–100)
TCO2: 22 mmol/L (ref 0–100)
TCO2: 22 mmol/L (ref 0–100)
pCO2 arterial: 40.9 mmHg (ref 35.0–45.0)
pCO2 arterial: 51 mmHg — ABNORMAL HIGH (ref 35.0–45.0)
pCO2 arterial: 57.5 mmHg (ref 35.0–45.0)
pCO2 arterial: 39.9 mmHg (ref 35.0–45.0)
pCO2 arterial: 39.6 mmHg (ref 35.0–45.0)
pCO2 arterial: 39.2 mmHg (ref 35.0–45.0)
pCO2 arterial: 38.5 mmHg (ref 35.0–45.0)
pH, Arterial: 7.346 — ABNORMAL LOW (ref 7.350–7.450)
pH, Arterial: 7.241 — ABNORMAL LOW (ref 7.350–7.450)
pH, Arterial: 7.221 — ABNORMAL LOW (ref 7.350–7.450)
pH, Arterial: 7.346 — ABNORMAL LOW (ref 7.350–7.450)
pH, Arterial: 7.323 — ABNORMAL LOW (ref 7.350–7.450)
pH, Arterial: 7.331 — ABNORMAL LOW (ref 7.350–7.450)
pH, Arterial: 7.343 — ABNORMAL LOW (ref 7.350–7.450)
pO2, Arterial: 349 mmHg — ABNORMAL HIGH (ref 80.0–100.0)
pO2, Arterial: 110 mmHg — ABNORMAL HIGH (ref 80.0–100.0)
pO2, Arterial: 55 mmHg — ABNORMAL LOW (ref 80.0–100.0)
pO2, Arterial: 72 mmHg — ABNORMAL LOW (ref 80.0–100.0)
pO2, Arterial: 59 mmHg — ABNORMAL LOW (ref 80.0–100.0)
pO2, Arterial: 53 mmHg — ABNORMAL LOW (ref 80.0–100.0)
pO2, Arterial: 63 mmHg — ABNORMAL LOW (ref 80.0–100.0)

## 2013-07-17 LAB — POCT I-STAT 4, (NA,K, GLUC, HGB,HCT)
Glucose, Bld: 136 mg/dL — ABNORMAL HIGH (ref 70–99)
Glucose, Bld: 114 mg/dL — ABNORMAL HIGH (ref 70–99)
Glucose, Bld: 102 mg/dL — ABNORMAL HIGH (ref 70–99)
Glucose, Bld: 115 mg/dL — ABNORMAL HIGH (ref 70–99)
Glucose, Bld: 123 mg/dL — ABNORMAL HIGH (ref 70–99)
Glucose, Bld: 190 mg/dL — ABNORMAL HIGH (ref 70–99)
Glucose, Bld: 134 mg/dL — ABNORMAL HIGH (ref 70–99)
HCT: 31 % — ABNORMAL LOW (ref 36.0–46.0)
HCT: 31 % — ABNORMAL LOW (ref 36.0–46.0)
HCT: 23 % — ABNORMAL LOW (ref 36.0–46.0)
HCT: 23 % — ABNORMAL LOW (ref 36.0–46.0)
HCT: 23 % — ABNORMAL LOW (ref 36.0–46.0)
HCT: 26 % — ABNORMAL LOW (ref 36.0–46.0)
HCT: 30 % — ABNORMAL LOW (ref 36.0–46.0)
Hemoglobin: 10.5 g/dL — ABNORMAL LOW (ref 12.0–15.0)
Hemoglobin: 10.5 g/dL — ABNORMAL LOW (ref 12.0–15.0)
Hemoglobin: 7.8 g/dL — ABNORMAL LOW (ref 12.0–15.0)
Hemoglobin: 7.8 g/dL — ABNORMAL LOW (ref 12.0–15.0)
Hemoglobin: 7.8 g/dL — ABNORMAL LOW (ref 12.0–15.0)
Hemoglobin: 8.8 g/dL — ABNORMAL LOW (ref 12.0–15.0)
Hemoglobin: 10.2 g/dL — ABNORMAL LOW (ref 12.0–15.0)
Potassium: 4.1 mEq/L (ref 3.5–5.1)
Potassium: 3.9 mEq/L (ref 3.5–5.1)
Potassium: 3.5 mEq/L (ref 3.5–5.1)
Potassium: 4.3 mEq/L (ref 3.5–5.1)
Potassium: 4.3 mEq/L (ref 3.5–5.1)
Potassium: 5 mEq/L (ref 3.5–5.1)
Potassium: 3.8 mEq/L (ref 3.5–5.1)
Sodium: 140 mEq/L (ref 135–145)
Sodium: 140 mEq/L (ref 135–145)
Sodium: 137 mEq/L (ref 135–145)
Sodium: 138 mEq/L (ref 135–145)
Sodium: 138 mEq/L (ref 135–145)
Sodium: 139 mEq/L (ref 135–145)
Sodium: 141 mEq/L (ref 135–145)

## 2013-07-17 LAB — GLUCOSE, CAPILLARY
Glucose-Capillary: 144 mg/dL — ABNORMAL HIGH (ref 70–99)
Glucose-Capillary: 109 mg/dL — ABNORMAL HIGH (ref 70–99)
Glucose-Capillary: 66 mg/dL — ABNORMAL LOW (ref 70–99)
Glucose-Capillary: 147 mg/dL — ABNORMAL HIGH (ref 70–99)
Glucose-Capillary: 160 mg/dL — ABNORMAL HIGH (ref 70–99)
Glucose-Capillary: 158 mg/dL — ABNORMAL HIGH (ref 70–99)
Glucose-Capillary: 126 mg/dL — ABNORMAL HIGH (ref 70–99)
Glucose-Capillary: 153 mg/dL — ABNORMAL HIGH (ref 70–99)
Glucose-Capillary: 135 mg/dL — ABNORMAL HIGH (ref 70–99)
Glucose-Capillary: 118 mg/dL — ABNORMAL HIGH (ref 70–99)

## 2013-07-17 LAB — POCT I-STAT, CHEM 8
BUN: 17 mg/dL (ref 6–23)
Calcium, Ion: 1.14 mmol/L (ref 1.13–1.30)
Chloride: 106 mEq/L (ref 96–112)
Creatinine, Ser: 1.2 mg/dL — ABNORMAL HIGH (ref 0.50–1.10)
Glucose, Bld: 156 mg/dL — ABNORMAL HIGH (ref 70–99)
HCT: 27 % — ABNORMAL LOW (ref 36.0–46.0)
Hemoglobin: 9.2 g/dL — ABNORMAL LOW (ref 12.0–15.0)
Potassium: 4 mEq/L (ref 3.5–5.1)
Sodium: 139 mEq/L (ref 135–145)
TCO2: 21 mmol/L (ref 0–100)

## 2013-07-17 LAB — CBC
HCT: 28.5 % — ABNORMAL LOW (ref 36.0–46.0)
HCT: 26 % — ABNORMAL LOW (ref 36.0–46.0)
Hemoglobin: 9.7 g/dL — ABNORMAL LOW (ref 12.0–15.0)
Hemoglobin: 8.5 g/dL — ABNORMAL LOW (ref 12.0–15.0)
MCH: 30 pg (ref 26.0–34.0)
MCH: 28.7 pg (ref 26.0–34.0)
MCHC: 34 g/dL (ref 30.0–36.0)
MCHC: 32.7 g/dL (ref 30.0–36.0)
MCV: 87.8 fL (ref 78.0–100.0)
Platelets: 195 10*3/uL (ref 150–400)
Platelets: 150 10*3/uL (ref 150–400)
RBC: 2.96 MIL/uL — ABNORMAL LOW (ref 3.87–5.11)
RDW: 14.3 % (ref 11.5–15.5)
RDW: 14.2 % (ref 11.5–15.5)
WBC: 17.4 10*3/uL — ABNORMAL HIGH (ref 4.0–10.5)

## 2013-07-17 LAB — HEMOGLOBIN AND HEMATOCRIT, BLOOD
HCT: 21.9 % — ABNORMAL LOW (ref 36.0–46.0)
Hemoglobin: 7.4 g/dL — ABNORMAL LOW (ref 12.0–15.0)

## 2013-07-17 LAB — PROTIME-INR
INR: 1.45 (ref 0.00–1.49)
Prothrombin Time: 17.3 seconds — ABNORMAL HIGH (ref 11.6–15.2)

## 2013-07-17 LAB — PLATELET COUNT: Platelets: 156 10*3/uL (ref 150–400)

## 2013-07-17 LAB — CREATININE, SERUM
Creatinine, Ser: 0.94 mg/dL (ref 0.50–1.10)
GFR calc Af Amer: 66 mL/min — ABNORMAL LOW (ref >90)
GFR calc non Af Amer: 57 mL/min — ABNORMAL LOW (ref >90)

## 2013-07-17 LAB — MAGNESIUM: Magnesium: 3 mg/dL — ABNORMAL HIGH (ref 1.5–2.5)

## 2013-07-17 LAB — BLOOD PRODUCT ORDER (VERBAL) VERIFICATION

## 2013-07-17 SURGERY — REPLACEMENT, AORTIC VALVE, OPEN
Anesthesia: General | Site: Chest | Wound class: Clean

## 2013-07-17 MED ORDER — ASPIRIN EC 325 MG PO TBEC
325.0000 mg | DELAYED_RELEASE_TABLET | Freq: Every day | ORAL | Status: DC
  Administered 2013-07-18 – 2013-07-24 (×7): 325 mg via ORAL
  Filled 2013-07-17 (×7): qty 1

## 2013-07-17 MED ORDER — MIDAZOLAM HCL 5 MG/5ML IJ SOLN
INTRAMUSCULAR | Status: DC | PRN
  Administered 2013-07-17: 2 mg via INTRAVENOUS
  Administered 2013-07-17 (×2): 3 mg via INTRAVENOUS
  Administered 2013-07-17: 2 mg via INTRAVENOUS

## 2013-07-17 MED ORDER — FLUTICASONE PROPIONATE HFA 44 MCG/ACT IN AERO
2.0000 | INHALATION_SPRAY | Freq: Every day | RESPIRATORY_TRACT | Status: DC
  Administered 2013-07-19 – 2013-07-24 (×5): 2 via RESPIRATORY_TRACT
  Filled 2013-07-17: qty 10.6

## 2013-07-17 MED ORDER — 0.9 % SODIUM CHLORIDE (POUR BTL) OPTIME
TOPICAL | Status: DC | PRN
  Administered 2013-07-17: 6000 mL

## 2013-07-17 MED ORDER — MUPIROCIN 2 % EX OINT: TOPICAL_OINTMENT | Freq: Two times a day (BID) | CUTANEOUS | Status: DC

## 2013-07-17 MED ORDER — BISACODYL 5 MG PO TBEC
10.0000 mg | DELAYED_RELEASE_TABLET | Freq: Every day | ORAL | Status: DC
  Administered 2013-07-18 – 2013-07-23 (×4): 10 mg via ORAL
  Filled 2013-07-17 (×6): qty 2

## 2013-07-17 MED ORDER — METOPROLOL TARTRATE 12.5 MG HALF TABLET
ORAL_TABLET | ORAL | Status: AC
  Filled 2013-07-17: qty 1

## 2013-07-17 MED ORDER — ACETAMINOPHEN 500 MG PO TABS
1000.0000 mg | ORAL_TABLET | Freq: Four times a day (QID) | ORAL | Status: AC
  Administered 2013-07-18 – 2013-07-22 (×11): 1000 mg via ORAL
  Filled 2013-07-17 (×20): qty 2

## 2013-07-17 MED ORDER — SODIUM CHLORIDE 0.9 % IV SOLN
INTRAVENOUS | Status: DC | PRN
  Administered 2013-07-17: 12:00:00 via INTRAVENOUS

## 2013-07-17 MED ORDER — SODIUM CHLORIDE 0.9 % IJ SOLN
3.0000 mL | Freq: Two times a day (BID) | INTRAMUSCULAR | Status: DC
  Administered 2013-07-18 – 2013-07-21 (×7): 3 mL via INTRAVENOUS
  Administered 2013-07-21: 23:00:00 via INTRAVENOUS
  Administered 2013-07-22 – 2013-07-24 (×4): 3 mL via INTRAVENOUS

## 2013-07-17 MED ORDER — DOPAMINE-DEXTROSE 3.2-5 MG/ML-% IV SOLN
INTRAVENOUS | Status: DC | PRN
  Administered 2013-07-17: 2 ug/kg/min via INTRAVENOUS

## 2013-07-17 MED ORDER — PROPOFOL 10 MG/ML IV BOLUS
INTRAVENOUS | Status: DC | PRN
  Administered 2013-07-17: 30 mg via INTRAVENOUS
  Administered 2013-07-17: 50 mg via INTRAVENOUS

## 2013-07-17 MED ORDER — POTASSIUM CHLORIDE 10 MEQ/50ML IV SOLN
10.0000 meq | INTRAVENOUS | Status: AC
  Administered 2013-07-17 (×3): 10 meq via INTRAVENOUS

## 2013-07-17 MED ORDER — SODIUM CHLORIDE 0.9 % IJ SOLN
OROMUCOSAL | Status: DC | PRN
  Administered 2013-07-17: 07:00:00 via TOPICAL

## 2013-07-17 MED ORDER — MORPHINE SULFATE 2 MG/ML IJ SOLN: 2.0000 mg | INTRAMUSCULAR | Status: DC | PRN

## 2013-07-17 MED ORDER — ASPIRIN 81 MG PO CHEW: 324.0000 mg | CHEWABLE_TABLET | Freq: Every day | ORAL | Status: DC

## 2013-07-17 MED ORDER — DEXTROSE 5 % IV SOLN
1.5000 g | Freq: Two times a day (BID) | INTRAVENOUS | Status: AC
  Administered 2013-07-17 – 2013-07-19 (×4): 1.5 g via INTRAVENOUS
  Filled 2013-07-17 (×4): qty 1.5

## 2013-07-17 MED ORDER — DEXTROSE 50 % IV SOLN
14.0000 mL | Freq: Once | INTRAVENOUS | Status: AC | PRN
  Administered 2013-07-17: 14 mL via INTRAVENOUS

## 2013-07-17 MED ORDER — FENTANYL CITRATE 0.05 MG/ML IJ SOLN
INTRAMUSCULAR | Status: DC | PRN
  Administered 2013-07-17: 50 ug via INTRAVENOUS
  Administered 2013-07-17: 300 ug via INTRAVENOUS
  Administered 2013-07-17: 100 ug via INTRAVENOUS
  Administered 2013-07-17: 50 ug via INTRAVENOUS
  Administered 2013-07-17: 1100 ug via INTRAVENOUS
  Administered 2013-07-17: 50 ug via INTRAVENOUS

## 2013-07-17 MED ORDER — NITROGLYCERIN IN D5W 200-5 MCG/ML-% IV SOLN
0.0000 ug/min | INTRAVENOUS | Status: DC
  Administered 2013-07-17: 0 ug/min via INTRAVENOUS

## 2013-07-17 MED ORDER — SODIUM CHLORIDE 0.9 % IV SOLN: 250.0000 mL | INTRAVENOUS | Status: DC

## 2013-07-17 MED ORDER — MILRINONE IN DEXTROSE 20 MG/100ML IV SOLN
INTRAVENOUS | Status: DC | PRN
  Administered 2013-07-17: .3 ug/kg/min via INTRAVENOUS

## 2013-07-17 MED ORDER — ROCURONIUM BROMIDE 100 MG/10ML IV SOLN
INTRAVENOUS | Status: DC | PRN
  Administered 2013-07-17 (×2): 50 mg via INTRAVENOUS

## 2013-07-17 MED ORDER — MILRINONE IN DEXTROSE 20 MG/100ML IV SOLN: INTRAVENOUS | Status: DC | PRN

## 2013-07-17 MED ORDER — DEXTROSE 50 % IV SOLN
INTRAVENOUS | Status: AC
  Administered 2013-07-17: 14 mL via INTRAVENOUS
  Filled 2013-07-17: qty 50

## 2013-07-17 MED ORDER — ACETAMINOPHEN 160 MG/5ML PO SOLN
650.0000 mg | Freq: Once | ORAL | Status: AC
Start: 2013-07-17 — End: 2013-07-17

## 2013-07-17 MED ORDER — MAGNESIUM SULFATE 40 MG/ML IJ SOLN
4.0000 g | Freq: Once | INTRAMUSCULAR | Status: AC
  Administered 2013-07-17: 4 g via INTRAVENOUS
  Filled 2013-07-17: qty 100

## 2013-07-17 MED ORDER — DEXMEDETOMIDINE HCL IN NACL 200 MCG/50ML IV SOLN
0.1000 ug/kg/h | INTRAVENOUS | Status: DC
  Administered 2013-07-17: 0.7 ug/kg/h via INTRAVENOUS
  Filled 2013-07-17: qty 50

## 2013-07-17 MED ORDER — VANCOMYCIN HCL IN DEXTROSE 1-5 GM/200ML-% IV SOLN
1000.0000 mg | Freq: Two times a day (BID) | INTRAVENOUS | Status: AC
  Administered 2013-07-17 – 2013-07-18 (×3): 1000 mg via INTRAVENOUS
  Filled 2013-07-17 (×3): qty 200

## 2013-07-17 MED ORDER — ALBUMIN HUMAN 5 % IV SOLN
250.0000 mL | INTRAVENOUS | Status: AC | PRN
  Administered 2013-07-17 (×3): 250 mL via INTRAVENOUS
  Filled 2013-07-17: qty 250

## 2013-07-17 MED ORDER — LACTATED RINGERS IV SOLN
INTRAVENOUS | Status: DC | PRN
  Administered 2013-07-17 (×2): via INTRAVENOUS

## 2013-07-17 MED ORDER — INSULIN REGULAR BOLUS VIA INFUSION
0.0000 [IU] | Freq: Three times a day (TID) | INTRAVENOUS | Status: DC
  Filled 2013-07-17: qty 10

## 2013-07-17 MED ORDER — DOCUSATE SODIUM 100 MG PO CAPS
200.0000 mg | ORAL_CAPSULE | Freq: Every day | ORAL | Status: DC
  Administered 2013-07-18 – 2013-07-23 (×4): 200 mg via ORAL
  Filled 2013-07-17 (×6): qty 2

## 2013-07-17 MED ORDER — BISACODYL 10 MG RE SUPP: 10.0000 mg | Freq: Every day | RECTAL | Status: DC

## 2013-07-17 MED ORDER — SODIUM CHLORIDE 0.45 % IV SOLN
INTRAVENOUS | Status: DC
  Administered 2013-07-17: 20 mL/h via INTRAVENOUS

## 2013-07-17 MED ORDER — MUPIROCIN 2 % EX OINT
TOPICAL_OINTMENT | Freq: Two times a day (BID) | CUTANEOUS | Status: AC
  Administered 2013-07-17 – 2013-07-19 (×4): via TOPICAL
  Filled 2013-07-17: qty 22

## 2013-07-17 MED ORDER — PROTAMINE SULFATE 10 MG/ML IV SOLN
INTRAVENOUS | Status: DC | PRN
  Administered 2013-07-17: 200 mg via INTRAVENOUS

## 2013-07-17 MED ORDER — LACTATED RINGERS IV SOLN
INTRAVENOUS | Status: DC
  Administered 2013-07-17: 20 mL/h via INTRAVENOUS

## 2013-07-17 MED ORDER — SODIUM BICARBONATE 8.4 % IV SOLN
INTRAVENOUS | Status: DC | PRN
  Administered 2013-07-17: 50 meq via INTRAVENOUS

## 2013-07-17 MED ORDER — DOPAMINE-DEXTROSE 3.2-5 MG/ML-% IV SOLN
0.0000 ug/kg/min | INTRAVENOUS | Status: DC
  Administered 2013-07-17: 2 ug/kg/min via INTRAVENOUS

## 2013-07-17 MED ORDER — MIDAZOLAM HCL 2 MG/2ML IJ SOLN: 2.0000 mg | INTRAMUSCULAR | Status: DC | PRN

## 2013-07-17 MED ORDER — CHLORHEXIDINE GLUCONATE 4 % EX LIQD
30.0000 mL | CUTANEOUS | Status: DC
Start: 2013-07-17 — End: 2013-07-17

## 2013-07-17 MED ORDER — FAMOTIDINE IN NACL 20-0.9 MG/50ML-% IV SOLN
20.0000 mg | Freq: Two times a day (BID) | INTRAVENOUS | Status: AC
  Administered 2013-07-17: 20 mg via INTRAVENOUS

## 2013-07-17 MED ORDER — PANTOPRAZOLE SODIUM 40 MG PO TBEC
40.0000 mg | DELAYED_RELEASE_TABLET | Freq: Every day | ORAL | Status: DC
  Administered 2013-07-19: 40 mg via ORAL
  Filled 2013-07-17: qty 1

## 2013-07-17 MED ORDER — PHENYLEPHRINE HCL 10 MG/ML IJ SOLN
0.0000 ug/min | INTRAVENOUS | Status: DC
  Administered 2013-07-17: 30 ug/min via INTRAVENOUS
  Administered 2013-07-17: 50 ug/min via INTRAVENOUS
  Filled 2013-07-17 (×3): qty 2

## 2013-07-17 MED ORDER — SERTRALINE HCL 50 MG PO TABS
50.0000 mg | ORAL_TABLET | Freq: Every morning | ORAL | Status: DC
  Administered 2013-07-18 – 2013-07-24 (×7): 50 mg via ORAL
  Filled 2013-07-17 (×7): qty 1

## 2013-07-17 MED ORDER — ALBUMIN HUMAN 5 % IV SOLN
INTRAVENOUS | Status: DC | PRN
  Administered 2013-07-17: 13:00:00 via INTRAVENOUS

## 2013-07-17 MED ORDER — OXYCODONE HCL 5 MG PO TABS: 5.0000 mg | ORAL_TABLET | ORAL | Status: DC | PRN

## 2013-07-17 MED ORDER — ONDANSETRON HCL 4 MG/2ML IJ SOLN
4.0000 mg | Freq: Four times a day (QID) | INTRAMUSCULAR | Status: DC | PRN
  Administered 2013-07-18: 4 mg via INTRAVENOUS
  Filled 2013-07-17: qty 2

## 2013-07-17 MED ORDER — ACETAMINOPHEN 650 MG RE SUPP
650.0000 mg | Freq: Once | RECTAL | Status: AC
  Administered 2013-07-17: 650 mg via RECTAL

## 2013-07-17 MED ORDER — HEMOSTATIC AGENTS (NO CHARGE) OPTIME
TOPICAL | Status: DC | PRN
  Administered 2013-07-17: 1 via TOPICAL

## 2013-07-17 MED ORDER — METOPROLOL TARTRATE 12.5 MG HALF TABLET
12.5000 mg | ORAL_TABLET | Freq: Once | ORAL | Status: AC
  Administered 2013-07-17: 12.5 mg via ORAL

## 2013-07-17 MED ORDER — HEPARIN SODIUM (PORCINE) 1000 UNIT/ML IJ SOLN
INTRAMUSCULAR | Status: DC | PRN
  Administered 2013-07-17: 15000 [IU] via INTRAVENOUS
  Administered 2013-07-17: 5000 [IU] via INTRAVENOUS

## 2013-07-17 MED ORDER — MILRINONE IN DEXTROSE 20 MG/100ML IV SOLN
0.2000 ug/kg/min | INTRAVENOUS | Status: DC
  Administered 2013-07-17: 0.3 ug/kg/min via INTRAVENOUS
  Administered 2013-07-18: 0.2 ug/kg/min via INTRAVENOUS
  Filled 2013-07-17: qty 100

## 2013-07-17 MED ORDER — MORPHINE SULFATE 2 MG/ML IJ SOLN
1.0000 mg | INTRAMUSCULAR | Status: AC | PRN
  Administered 2013-07-17 (×2): 1 mg via INTRAVENOUS
  Administered 2013-07-17: 2 mg via INTRAVENOUS
  Filled 2013-07-17 (×2): qty 1

## 2013-07-17 MED ORDER — SODIUM CHLORIDE 0.9 % IV SOLN
INTRAVENOUS | Status: DC
  Administered 2013-07-17: 20 mL/h via INTRAVENOUS

## 2013-07-17 MED ORDER — SODIUM CHLORIDE 0.9 % IV SOLN
INTRAVENOUS | Status: DC
  Administered 2013-07-17: 2.9 [IU]/h via INTRAVENOUS
  Administered 2013-07-17: 5.2 [IU]/h via INTRAVENOUS
  Filled 2013-07-17 (×2): qty 1

## 2013-07-17 MED ORDER — SIMVASTATIN 10 MG PO TABS
10.0000 mg | ORAL_TABLET | Freq: Every day | ORAL | Status: DC
  Administered 2013-07-18 – 2013-07-23 (×5): 10 mg via ORAL
  Filled 2013-07-17 (×8): qty 1

## 2013-07-17 MED ORDER — ACETAMINOPHEN 160 MG/5ML PO SOLN
1000.0000 mg | Freq: Four times a day (QID) | ORAL | Status: AC
  Filled 2013-07-17: qty 40

## 2013-07-17 MED ORDER — SODIUM CHLORIDE 0.9 % IJ SOLN
3.0000 mL | INTRAMUSCULAR | Status: DC | PRN
  Administered 2013-07-18: 3 mL via INTRAVENOUS

## 2013-07-17 MED ORDER — LACTATED RINGERS IV SOLN: 500.0000 mL | Freq: Once | INTRAVENOUS | Status: DC | PRN

## 2013-07-17 MED ORDER — VANCOMYCIN HCL IN DEXTROSE 1-5 GM/200ML-% IV SOLN
1000.0000 mg | Freq: Once | INTRAVENOUS | Status: DC
  Filled 2013-07-17: qty 200

## 2013-07-17 MED ORDER — GLYCOPYRROLATE 0.2 MG/ML IJ SOLN
INTRAMUSCULAR | Status: DC | PRN
  Administered 2013-07-17: 0.1 mg via INTRAVENOUS

## 2013-07-17 MED ORDER — VECURONIUM BROMIDE 10 MG IV SOLR
INTRAVENOUS | Status: DC | PRN
  Administered 2013-07-17: 2 mg via INTRAVENOUS
  Administered 2013-07-17: 3 mg via INTRAVENOUS
  Administered 2013-07-17: 2 mg via INTRAVENOUS

## 2013-07-17 MED ORDER — MILRINONE IN DEXTROSE 20 MG/100ML IV SOLN
0.3750 ug/kg/min | INTRAVENOUS | Status: DC
  Filled 2013-07-17: qty 100

## 2013-07-17 SURGICAL SUPPLY — 115 items
ADAPTER CARDIO PERF ANTE/RETRO (ADAPTER) ×4 IMPLANT
ADH SRG 12 PREFL SYR 3 SPRDR (MISCELLANEOUS)
ADPR PRFSN 84XANTGRD RTRGD (ADAPTER) ×2
ATTRACTOMAT 16X20 MAGNETIC DRP (DRAPES) ×4 IMPLANT
BAG DECANTER FOR FLEXI CONT (MISCELLANEOUS) ×4 IMPLANT
BANDAGE ELASTIC 4 VELCRO ST LF (GAUZE/BANDAGES/DRESSINGS) ×4 IMPLANT
BANDAGE ELASTIC 6 VELCRO ST LF (GAUZE/BANDAGES/DRESSINGS) ×4 IMPLANT
BANDAGE GAUZE ELAST BULKY 4 IN (GAUZE/BANDAGES/DRESSINGS) ×4 IMPLANT
BASKET HEART  (ORDER IN 25'S) (MISCELLANEOUS) ×1
BASKET HEART (ORDER IN 25'S) (MISCELLANEOUS) ×1
BASKET HEART (ORDER IN 25S) (MISCELLANEOUS) ×2 IMPLANT
BLADE STERNUM SYSTEM 6 (BLADE) ×4 IMPLANT
BLADE SURG 12 STRL SS (BLADE) ×4 IMPLANT
BLADE SURG 15 STRL LF DISP TIS (BLADE) ×2 IMPLANT
BLADE SURG 15 STRL SS (BLADE) ×4
BLADE SURG ROTATE 9660 (MISCELLANEOUS) IMPLANT
CANISTER SUCTION 2500CC (MISCELLANEOUS) ×4 IMPLANT
CANNULA AORTIC HI-FLOW 6.5M20F (CANNULA) ×4 IMPLANT
CANNULA ARTERIAL NVNT 3/8 20FR (MISCELLANEOUS) ×4 IMPLANT
CANNULA GUNDRY RCSP 15FR (MISCELLANEOUS) ×4 IMPLANT
CANNULA VENOUS LOW PROF 32X40 (CANNULA) ×4 IMPLANT
CANNULA VENOUS MAL SGL STG 40 (MISCELLANEOUS) IMPLANT
CANNULAE VENOUS MAL SGL STG 40 (MISCELLANEOUS)
CATH CPB KIT VANTRIGT (MISCELLANEOUS) ×4 IMPLANT
CATH HEART VENT LEFT (CATHETERS) ×2 IMPLANT
CATH RETROPLEGIA CORONARY 14FR (CATHETERS) IMPLANT
CATH ROBINSON RED A/P 18FR (CATHETERS) ×14 IMPLANT
CATH THORACIC 28FR (CATHETERS) IMPLANT
CATH THORACIC 28FR RT ANG (CATHETERS) IMPLANT
CATH THORACIC 36FR (CATHETERS) IMPLANT
CATH THORACIC 36FR RT ANG (CATHETERS) ×8 IMPLANT
CLIP FOGARTY SPRING 6M (CLIP) IMPLANT
CLIP TI WIDE RED SMALL 24 (CLIP) IMPLANT
CONT SPEC 4OZ CLIKSEAL STRL BL (MISCELLANEOUS) ×2 IMPLANT
COVER SURGICAL LIGHT HANDLE (MISCELLANEOUS) ×8 IMPLANT
CRADLE DONUT ADULT HEAD (MISCELLANEOUS) ×4 IMPLANT
DRAIN CHANNEL 32F RND 10.7 FF (WOUND CARE) ×4 IMPLANT
DRAPE CARDIOVASCULAR INCISE (DRAPES) ×4
DRAPE SLUSH/WARMER DISC (DRAPES) ×4 IMPLANT
DRAPE SRG 135X102X78XABS (DRAPES) ×2 IMPLANT
DRSG AQUACEL AG ADV 3.5X14 (GAUZE/BANDAGES/DRESSINGS) ×4 IMPLANT
DRSG COVADERM 4X14 (GAUZE/BANDAGES/DRESSINGS) ×4 IMPLANT
ELECT BLADE 4.0 EZ CLEAN MEGAD (MISCELLANEOUS) ×4
ELECT BLADE 6.5 EXT (BLADE) ×4 IMPLANT
ELECT CAUTERY BLADE 6.4 (BLADE) ×4 IMPLANT
ELECT REM PT RETURN 9FT ADLT (ELECTROSURGICAL) ×8
ELECTRODE BLDE 4.0 EZ CLN MEGD (MISCELLANEOUS) ×2 IMPLANT
ELECTRODE REM PT RTRN 9FT ADLT (ELECTROSURGICAL) ×4 IMPLANT
GLOVE BIO SURGEON STRL SZ7.5 (GLOVE) ×8 IMPLANT
GOWN STRL NON-REIN LRG LVL3 (GOWN DISPOSABLE) ×16 IMPLANT
HEMOSTAT POWDER SURGIFOAM 1G (HEMOSTASIS) ×12 IMPLANT
HEMOSTAT SURGICEL 2X14 (HEMOSTASIS) ×6 IMPLANT
INSERT FOGARTY XLG (MISCELLANEOUS) IMPLANT
KIT BASIN OR (CUSTOM PROCEDURE TRAY) ×4 IMPLANT
KIT ROOM TURNOVER OR (KITS) ×4 IMPLANT
KIT SUCTION CATH 14FR (SUCTIONS) ×4 IMPLANT
KIT VASOVIEW W/TROCAR VH 2000 (KITS) ×4 IMPLANT
LEAD PACING MYOCARDI (MISCELLANEOUS) ×4 IMPLANT
MARKER GRAFT CORONARY BYPASS (MISCELLANEOUS) ×12 IMPLANT
NS IRRIG 1000ML POUR BTL (IV SOLUTION) ×24 IMPLANT
PACK OPEN HEART (CUSTOM PROCEDURE TRAY) ×4 IMPLANT
PAD ARMBOARD 7.5X6 YLW CONV (MISCELLANEOUS) ×8 IMPLANT
PAD ELECT DEFIB RADIOL ZOLL (MISCELLANEOUS) ×4 IMPLANT
PENCIL BUTTON HOLSTER BLD 10FT (ELECTRODE) ×4 IMPLANT
PUNCH AORTIC ROTATE 4.0MM (MISCELLANEOUS) IMPLANT
PUNCH AORTIC ROTATE 4.5MM 8IN (MISCELLANEOUS) IMPLANT
PUNCH AORTIC ROTATE 5MM 8IN (MISCELLANEOUS) IMPLANT
SPONGE GAUZE 4X4 12PLY (GAUZE/BANDAGES/DRESSINGS) ×6 IMPLANT
SURGIFLO W/THROMBIN 8M KIT (HEMOSTASIS) ×2 IMPLANT
SUT BONE WAX W31G (SUTURE) ×4 IMPLANT
SUT ETHIBON 2 0 V 52N 30 (SUTURE) ×4 IMPLANT
SUT ETHIBOND 2 0 SH (SUTURE) ×4
SUT ETHIBOND 2 0 SH 36X2 (SUTURE) ×2 IMPLANT
SUT MNCRL AB 4-0 PS2 18 (SUTURE) IMPLANT
SUT PROLENE 3 0 RB 1 (SUTURE) ×4 IMPLANT
SUT PROLENE 3 0 SH 1 (SUTURE) IMPLANT
SUT PROLENE 3 0 SH DA (SUTURE) IMPLANT
SUT PROLENE 3 0 SH1 36 (SUTURE) IMPLANT
SUT PROLENE 4 0 RB 1 (SUTURE) ×28
SUT PROLENE 4 0 SH DA (SUTURE) ×4 IMPLANT
SUT PROLENE 4-0 RB1 .5 CRCL 36 (SUTURE) ×2 IMPLANT
SUT PROLENE 5 0 C 1 36 (SUTURE) IMPLANT
SUT PROLENE 6 0 C 1 30 (SUTURE) IMPLANT
SUT PROLENE 6 0 CC (SUTURE) ×4 IMPLANT
SUT PROLENE 7 0 DA (SUTURE) IMPLANT
SUT PROLENE 7.0 RB 3 (SUTURE) ×12 IMPLANT
SUT PROLENE 8 0 BV175 6 (SUTURE) IMPLANT
SUT PROLENE BLUE 7 0 (SUTURE) ×4 IMPLANT
SUT SILK  1 MH (SUTURE)
SUT SILK 1 MH (SUTURE) IMPLANT
SUT SILK 2 0 SH CR/8 (SUTURE) IMPLANT
SUT SILK 3 0 SH CR/8 (SUTURE) IMPLANT
SUT STEEL 6MS V (SUTURE) ×8 IMPLANT
SUT STEEL SZ 6 DBL 3X14 BALL (SUTURE) ×4 IMPLANT
SUT VIC AB 1 CTX 36 (SUTURE) ×8
SUT VIC AB 1 CTX36XBRD ANBCTR (SUTURE) ×4 IMPLANT
SUT VIC AB 2-0 CT1 27 (SUTURE)
SUT VIC AB 2-0 CT1 TAPERPNT 27 (SUTURE) IMPLANT
SUT VIC AB 2-0 CTX 27 (SUTURE) ×2 IMPLANT
SUT VIC AB 3-0 SH 27 (SUTURE)
SUT VIC AB 3-0 SH 27X BRD (SUTURE) IMPLANT
SUT VIC AB 3-0 X1 27 (SUTURE) IMPLANT
SUT VICRYL 4-0 PS2 18IN ABS (SUTURE) IMPLANT
SUTURE E-PAK OPEN HEART (SUTURE) ×4 IMPLANT
SYR 10ML KIT SKIN ADHESIVE (MISCELLANEOUS) IMPLANT
SYSTEM SAHARA CHEST DRAIN ATS (WOUND CARE) ×4 IMPLANT
TAPE CLOTH SOFT 2X10 (GAUZE/BANDAGES/DRESSINGS) ×2 IMPLANT
TOWEL OR 17X24 6PK STRL BLUE (TOWEL DISPOSABLE) ×8 IMPLANT
TOWEL OR 17X26 10 PK STRL BLUE (TOWEL DISPOSABLE) ×8 IMPLANT
TRAY FOLEY IC TEMP SENS 14FR (CATHETERS) ×4 IMPLANT
TUBING INSUFFLATION 10FT LAP (TUBING) ×4 IMPLANT
UNDERPAD 30X30 INCONTINENT (UNDERPADS AND DIAPERS) ×4 IMPLANT
VALVE MAGNA EASE AORTIC 19MM (Prosthesis & Implant Heart) ×2 IMPLANT
VENT LEFT HEART 12002 (CATHETERS) ×4
WATER STERILE IRR 1000ML POUR (IV SOLUTION) ×8 IMPLANT

## 2013-07-17 NOTE — Progress Notes (Signed)
Trying to wean again, placed on 4 rate and 40%

## 2013-07-17 NOTE — OR Nursing (Signed)
2nd call to SICU (Amy)

## 2013-07-17 NOTE — Progress Notes (Signed)
The patient was examined and preop studies reviewed. There has been no change from the prior exam and the patient is ready for surgery.   Plan AVR and CABG on [place] today

## 2013-07-17 NOTE — Progress Notes (Signed)
Hypoglycemic Event  CBG: 66  Treatment: dextrose 14ml  Symptoms: None  Follow-up CBG: Time:1500 CBG Result:109 Possible Reasons for Event: Other: glucostabilizer  Comments/MD notified:md aware of status    Dell Ponto  Remember to initiate Hypoglycemia Order Set & complete

## 2013-07-17 NOTE — OR Nursing (Signed)
1st call to SICU nurse and off pump call to Surgical information.

## 2013-07-17 NOTE — Progress Notes (Signed)
ABG not within parameters, placed back on vent and recruitment done and placed patient on 50%. Will try again

## 2013-07-17 NOTE — Brief Op Note (Signed)
      [address]       [name] [address]             [phone]     07/17/2013  11:43 AM  PATIENT:  [name]  79 y.o. female  PRE-OPERATIVE DIAGNOSIS:  AS CAD  POST-OPERATIVE DIAGNOSIS:  aortic stenosis, coronary artery disease  PROCEDURE:  Procedure(s): AORTIC VALVE REPLACEMENT (AVR) #19 MAGNA EASE BIOPROSTHETIC CORONARY ARTERY BYPASS GRAFTING (CABG) [place]-L[place] INTRAOPERATIVE TRANSESOPHAGEAL ECHOCARDIOGRAM  SURGEON:  Surgeon(s): Kerin Perna, MD  PHYSICIAN ASSISTANT: Sylina Henion PA-C  ANESTHESIA:   general  PATIENT CONDITION:  ICU - intubated and hemodynamically stable.  PRE-OPERATIVE WEIGHT: 58kg  COMPLICATIONS: NO KNOWN

## 2013-07-17 NOTE — Procedures (Signed)
Extubation Procedure Note  Patient Details:   Name: [name] DOB: [birth year] MRN: [id number]   Airway Documentation:     Evaluation  O2 sats: stable throughout Complications: No apparent complications Patient did tolerate procedure well. Bilateral Breath Sounds: Clear;Diminished Suctioning: Airway Yes Nif -30 VC 700 with good efforts Post extubation IS 500 with good efforts, placed on [place] at 6l  Erskine Speed 07/17/2013, 11:27 PM

## 2013-07-17 NOTE — Preoperative (Signed)
Beta Blockers   Reason not to administer Beta Blockers:Not Applicable 

## 2013-07-17 NOTE — Progress Notes (Signed)
Pt didn't tolerate PS wean. ABG was below extubation criteria. Pt was placed back on SIMV/PRVC 550/10/40%/+5. Pt tolerating well. RT will reassess for extubation.

## 2013-07-17 NOTE — Progress Notes (Signed)
Patient ID: [name], female   DOB: 1932/04/29, 79 y.o.   MRN: [id number] EVENING ROUNDS NOTE :     [address]       [name] [address]             [phone]                 Day of Surgery Procedure(s) (LRB): AORTIC VALVE REPLACEMENT (AVR) (N/A) CORONARY ARTERY BYPASS GRAFT, TIMES ONE, ON PUMP, USING RIGHT INTERNAL MAMMARY ARTERY. (N/A) INTRAOPERATIVE TRANSESOPHAGEAL ECHOCARDIOGRAM (N/A)  Total Length of Stay:  LOS: 0 days  BP 103/54  Pulse 90  Temp(Src) 96.4 F (35.8 C) (Core (Comment))  Resp 14  Ht 5' (1.524 m)  Wt 130 lb (58.968 kg)  BMI 25.39 kg/m2  SpO2 97%  .Intake/Output     10/29 0701 - 10/30 0700 10/30 0701 - 10/31 0700   I.V. (mL/kg)  3165 (53.7)   Blood  255   IV Piggyback  900   Total Intake(mL/kg)  4320 (73.3)   Urine (mL/kg/hr)  1025 (1.6)   Blood  700 (1.1)   Chest Tube  80 (0.1)   Total Output   1805   Net   +2515          . sodium chloride 20 mL/hr (07/17/13 1315)  . sodium chloride 20 mL/hr at 07/17/13 1700  . [START ON 07/18/2013] sodium chloride    . dexmedetomidine 0.3 mcg/kg/hr (07/17/13 1700)  . DOPamine 3 mcg/kg/min (07/17/13 1700)  . insulin (NOVOLIN-R) infusion 5.2 Units/hr (07/17/13 1315)  . lactated ringers 20 mL/hr (07/17/13 1315)  . milrinone 0.2 mcg/kg/min (07/17/13 1700)  . nitroGLYCERIN 0 mcg/min (07/17/13 1315)  . phenylephrine (NEO-SYNEPHRINE) Adult infusion 50 mcg/min (07/17/13 1315)     Lab Results  Component Value Date   WBC 27.5* 07/17/2013   HGB 10.2* 07/17/2013   HCT 30.0* 07/17/2013   PLT 195 07/17/2013   GLUCOSE 134* 07/17/2013   ALT 12 07/11/2013   AST 19 07/11/2013   NA 141 07/17/2013   K 3.8 07/17/2013   CL 102 07/11/2013   CREATININE 1.19* 07/11/2013   BUN 28* 07/11/2013   CO2 16* 07/11/2013   INR 1.45 07/17/2013   HGBA1C 6.6* 07/11/2013   Still on vent, waking up Not bleeding Good uop  Delight Ovens MD  Beeper [phone] Office [phone] 07/17/2013 5:46 PM

## 2013-07-17 NOTE — Anesthesia Postprocedure Evaluation (Signed)
Anesthesia Post Note  Patient: [name]  Procedure(s) Performed: Procedure(s) (LRB): AORTIC VALVE REPLACEMENT (AVR) (N/A) CORONARY ARTERY BYPASS GRAFT, TIMES ONE, ON PUMP, USING RIGHT INTERNAL MAMMARY ARTERY. (N/A) INTRAOPERATIVE TRANSESOPHAGEAL ECHOCARDIOGRAM (N/A)  Anesthesia type: General  Patient location: ICU  Post pain: Pain level controlled  Post assessment: Post-op Vital signs reviewed  Last Vitals:  Filed Vitals:   07/17/13 1320  BP: 110/45  Pulse: 90  Temp:   Resp: 12    Post vital signs: stable  Level of consciousness: Patient remains intubated per anesthesia plan  Complications: No apparent anesthesia complications

## 2013-07-17 NOTE — Progress Notes (Signed)
  Echocardiogram Echocardiogram Transesophageal has been performed.  Jorje Guild 07/17/2013, 8:47 AM

## 2013-07-17 NOTE — Anesthesia Preprocedure Evaluation (Addendum)
Anesthesia Evaluation  Patient identified by MRN, date of birth, ID band Patient awake    Reviewed: Allergy & Precautions, H&P , NPO status , Patient's Chart, lab work & pertinent test results, reviewed documented beta blocker date and time   Airway Mallampati: I TM Distance: >3 FB Neck ROM: Full    Dental  (+) Edentulous Upper and Edentulous Lower   Pulmonary shortness of breath and with exertion, asthma , COPD         Cardiovascular hypertension, Pt. on medications     Neuro/Psych    GI/Hepatic GERD-  Medicated and Controlled,  Endo/Other  diabetes, Type 2, Oral Hypoglycemic Agents  Renal/GU      Musculoskeletal   Abdominal   Peds  Hematology   Anesthesia Other Findings   Reproductive/Obstetrics                          Anesthesia Physical Anesthesia Plan  ASA: III  Anesthesia Plan: General   Post-op Pain Management:    Induction: Intravenous  Airway Management Planned: Oral ETT  Additional Equipment: Arterial line, CVP, PA Cath, 3D TEE and Ultrasound Guidance Line Placement  Intra-op Plan:   Post-operative Plan: Post-operative intubation/ventilation  Informed Consent: I have reviewed the patients History and Physical, chart, labs and discussed the procedure including the risks, benefits and alternatives for the proposed anesthesia with the patient or authorized representative who has indicated his/her understanding and acceptance.   Dental advisory given  Plan Discussed with: CRNA  Anesthesia Plan Comments:        Anesthesia Quick Evaluation

## 2013-07-17 NOTE — Transfer of Care (Signed)
Immediate Anesthesia Transfer of Care Note  Patient: [name]  Procedure(s) Performed: Procedure(s): AORTIC VALVE REPLACEMENT (AVR) (N/A) CORONARY ARTERY BYPASS GRAFT, TIMES ONE, ON PUMP, USING RIGHT INTERNAL MAMMARY ARTERY. (N/A) INTRAOPERATIVE TRANSESOPHAGEAL ECHOCARDIOGRAM (N/A)  Patient Location: PACU  Anesthesia Type:General  Level of Consciousness: sedated, unresponsive and Patient remains intubated per anesthesia plan  Airway & Oxygen Therapy: Patient remains intubated per anesthesia plan and Patient placed on Ventilator (see vital sign flow sheet for setting)  Post-op Assessment: Report given to PACU RN and Post -op Vital signs reviewed and stable  Post vital signs: Reviewed and stable  Complications: No apparent anesthesia complications

## 2013-07-18 ENCOUNTER — Inpatient Hospital Stay (HOSPITAL_COMMUNITY): Payer: Medicare Other

## 2013-07-18 ENCOUNTER — Encounter (HOSPITAL_COMMUNITY): Payer: Self-pay | Admitting: Cardiothoracic Surgery

## 2013-07-18 LAB — GLUCOSE, CAPILLARY
Glucose-Capillary: 104 mg/dL — ABNORMAL HIGH (ref 70–99)
Glucose-Capillary: 107 mg/dL — ABNORMAL HIGH (ref 70–99)
Glucose-Capillary: 109 mg/dL — ABNORMAL HIGH (ref 70–99)
Glucose-Capillary: 110 mg/dL — ABNORMAL HIGH (ref 70–99)
Glucose-Capillary: 111 mg/dL — ABNORMAL HIGH (ref 70–99)
Glucose-Capillary: 117 mg/dL — ABNORMAL HIGH (ref 70–99)
Glucose-Capillary: 118 mg/dL — ABNORMAL HIGH (ref 70–99)
Glucose-Capillary: 125 mg/dL — ABNORMAL HIGH (ref 70–99)
Glucose-Capillary: 106 mg/dL — ABNORMAL HIGH (ref 70–99)
Glucose-Capillary: 122 mg/dL — ABNORMAL HIGH (ref 70–99)
Glucose-Capillary: 125 mg/dL — ABNORMAL HIGH (ref 70–99)
Glucose-Capillary: 128 mg/dL — ABNORMAL HIGH (ref 70–99)
Glucose-Capillary: 143 mg/dL — ABNORMAL HIGH (ref 70–99)
Glucose-Capillary: 146 mg/dL — ABNORMAL HIGH (ref 70–99)
Glucose-Capillary: 45 mg/dL — ABNORMAL LOW (ref 70–99)
Glucose-Capillary: 130 mg/dL — ABNORMAL HIGH (ref 70–99)
Glucose-Capillary: 103 mg/dL — ABNORMAL HIGH (ref 70–99)

## 2013-07-18 LAB — BASIC METABOLIC PANEL
CO2: 21 mEq/L (ref 19–32)
Chloride: 107 mEq/L (ref 96–112)
Potassium: 4.2 mEq/L (ref 3.5–5.1)
Sodium: 139 mEq/L (ref 135–145)

## 2013-07-18 LAB — POCT I-STAT, CHEM 8
BUN: 19 mg/dL (ref 6–23)
Calcium, Ion: 1.22 mmol/L (ref 1.13–1.30)
Chloride: 106 mEq/L (ref 96–112)
Creatinine, Ser: 1.4 mg/dL — ABNORMAL HIGH (ref 0.50–1.10)
Glucose, Bld: 124 mg/dL — ABNORMAL HIGH (ref 70–99)
HCT: 45 % (ref 36.0–46.0)
Hemoglobin: 15.3 g/dL — ABNORMAL HIGH (ref 12.0–15.0)
Potassium: 4.4 mEq/L (ref 3.5–5.1)
Sodium: 138 mEq/L (ref 135–145)
TCO2: 20 mmol/L (ref 0–100)

## 2013-07-18 LAB — POCT I-STAT 3, ART BLOOD GAS (G3+)
Acid-base deficit: 5 mmol/L — ABNORMAL HIGH (ref 0.0–2.0)
Acid-base deficit: 5 mmol/L — ABNORMAL HIGH (ref 0.0–2.0)
Acid-base deficit: 5 mmol/L — ABNORMAL HIGH (ref 0.0–2.0)
Bicarbonate: 20.2 mEq/L (ref 20.0–24.0)
Bicarbonate: 20.5 mEq/L (ref 20.0–24.0)
Bicarbonate: 20.7 mEq/L (ref 20.0–24.0)
O2 Saturation: 88 %
O2 Saturation: 96 %
O2 Saturation: 99 %
Patient temperature: 36.7
Patient temperature: 37.3
Patient temperature: 99.5
TCO2: 21 mmol/L (ref 0–100)
TCO2: 22 mmol/L (ref 0–100)
TCO2: 22 mmol/L (ref 0–100)
pCO2 arterial: 38.8 mmHg (ref 35.0–45.0)
pCO2 arterial: 40.1 mmHg (ref 35.0–45.0)
pCO2 arterial: 38.9 mmHg (ref 35.0–45.0)
pH, Arterial: 7.317 — ABNORMAL LOW (ref 7.350–7.450)
pH, Arterial: 7.323 — ABNORMAL LOW (ref 7.350–7.450)
pH, Arterial: 7.336 — ABNORMAL LOW (ref 7.350–7.450)
pO2, Arterial: 58 mmHg — ABNORMAL LOW (ref 80.0–100.0)
pO2, Arterial: 90 mmHg (ref 80.0–100.0)
pO2, Arterial: 159 mmHg — ABNORMAL HIGH (ref 80.0–100.0)

## 2013-07-18 LAB — CBC
MCV: 88 fL (ref 78.0–100.0)
MCV: 89.2 fL (ref 78.0–100.0)
Platelets: 129 10*3/uL — ABNORMAL LOW (ref 150–400)
Platelets: 121 10*3/uL — ABNORMAL LOW (ref 150–400)
RBC: 2.92 MIL/uL — ABNORMAL LOW (ref 3.87–5.11)
RBC: 3.05 MIL/uL — ABNORMAL LOW (ref 3.87–5.11)
WBC: 19.4 10*3/uL — ABNORMAL HIGH (ref 4.0–10.5)
WBC: 21.6 10*3/uL — ABNORMAL HIGH (ref 4.0–10.5)

## 2013-07-18 LAB — MAGNESIUM: Magnesium: 2.7 mg/dL — ABNORMAL HIGH (ref 1.5–2.5)

## 2013-07-18 LAB — CREATININE, SERUM
Creatinine, Ser: 1.23 mg/dL — ABNORMAL HIGH (ref 0.50–1.10)
GFR calc Af Amer: 47 mL/min — ABNORMAL LOW (ref >90)

## 2013-07-18 MED ORDER — INSULIN DETEMIR 100 UNIT/ML ~~LOC~~ SOLN
5.0000 [IU] | Freq: Two times a day (BID) | SUBCUTANEOUS | Status: DC
  Administered 2013-07-18 – 2013-07-21 (×8): 5 [IU] via SUBCUTANEOUS
  Filled 2013-07-18 (×9): qty 0.05

## 2013-07-18 MED ORDER — TRAMADOL HCL 50 MG PO TABS
50.0000 mg | ORAL_TABLET | Freq: Four times a day (QID) | ORAL | Status: DC | PRN
  Administered 2013-07-18 – 2013-07-24 (×8): 50 mg via ORAL
  Filled 2013-07-18 (×8): qty 1

## 2013-07-18 MED ORDER — FUROSEMIDE 10 MG/ML IJ SOLN
20.0000 mg | Freq: Once | INTRAMUSCULAR | Status: AC
  Administered 2013-07-18: 20 mg via INTRAVENOUS
  Filled 2013-07-18: qty 2

## 2013-07-18 MED ORDER — THIAMINE HCL 100 MG/ML IJ SOLN
100.0000 mg | Freq: Every day | INTRAMUSCULAR | Status: DC
  Administered 2013-07-18: 13:00:00 via INTRAVENOUS
  Administered 2013-07-19 – 2013-07-21 (×3): 100 mg via INTRAVENOUS
  Filled 2013-07-18 (×6): qty 1

## 2013-07-18 MED ORDER — METOCLOPRAMIDE HCL 5 MG/ML IJ SOLN
10.0000 mg | Freq: Four times a day (QID) | INTRAMUSCULAR | Status: AC
  Administered 2013-07-18 – 2013-07-19 (×4): 10 mg via INTRAVENOUS
  Filled 2013-07-18 (×4): qty 2

## 2013-07-18 MED ORDER — MORPHINE SULFATE 2 MG/ML IJ SOLN
2.0000 mg | INTRAMUSCULAR | Status: DC | PRN
  Administered 2013-07-18 – 2013-07-19 (×4): 2 mg via INTRAVENOUS
  Filled 2013-07-18 (×5): qty 1

## 2013-07-18 MED ORDER — INSULIN ASPART 100 UNIT/ML ~~LOC~~ SOLN
0.0000 [IU] | SUBCUTANEOUS | Status: DC
  Administered 2013-07-18: 2 [IU] via SUBCUTANEOUS
  Administered 2013-07-18: 0.6 [IU] via SUBCUTANEOUS
  Administered 2013-07-20: 2 [IU] via SUBCUTANEOUS
  Administered 2013-07-20 – 2013-07-21 (×2): 4 [IU] via SUBCUTANEOUS
  Administered 2013-07-21 – 2013-07-22 (×2): 2 [IU] via SUBCUTANEOUS

## 2013-07-18 MED ORDER — METOCLOPRAMIDE HCL 5 MG/ML IJ SOLN
10.0000 mg | Freq: Four times a day (QID) | INTRAMUSCULAR | Status: DC
  Administered 2013-07-18: 10 mg via INTRAVENOUS
  Filled 2013-07-18 (×5): qty 2

## 2013-07-18 MED ORDER — LATANOPROST 0.005 % OP SOLN
1.0000 [drp] | Freq: Every day | OPHTHALMIC | Status: DC
  Administered 2013-07-18 – 2013-07-23 (×4): 1 [drp] via OPHTHALMIC
  Filled 2013-07-18 (×3): qty 2.5

## 2013-07-18 MED FILL — Mannitol IV Soln 20%: INTRAVENOUS | Qty: 500 | Status: AC

## 2013-07-18 MED FILL — Potassium Chloride Inj 2 mEq/ML: INTRAVENOUS | Qty: 40 | Status: AC

## 2013-07-18 MED FILL — Lidocaine HCl IV Inj 20 MG/ML: INTRAVENOUS | Qty: 5 | Status: AC

## 2013-07-18 MED FILL — Electrolyte-R (PH 7.4) Solution: INTRAVENOUS | Qty: 1000 | Status: AC

## 2013-07-18 MED FILL — Magnesium Sulfate Inj 50%: INTRAMUSCULAR | Qty: 10 | Status: AC

## 2013-07-18 MED FILL — Sodium Chloride IV Soln 0.9%: INTRAVENOUS | Qty: 1000 | Status: AC

## 2013-07-18 MED FILL — Heparin Sodium (Porcine) Inj 1000 Unit/ML: INTRAMUSCULAR | Qty: 30 | Status: AC

## 2013-07-18 MED FILL — Heparin Sodium (Porcine) Inj 1000 Unit/ML: INTRAMUSCULAR | Qty: 10 | Status: AC

## 2013-07-18 MED FILL — Sodium Chloride Irrigation Soln 0.9%: Qty: 3000 | Status: AC

## 2013-07-18 MED FILL — Sodium Bicarbonate IV Soln 8.4%: INTRAVENOUS | Qty: 50 | Status: AC

## 2013-07-18 NOTE — Progress Notes (Signed)
1 Day Post-Op Procedure(s) (LRB): AORTIC VALVE REPLACEMENT (AVR) (N/A) CORONARY ARTERY BYPASS GRAFT, TIMES ONE, ON PUMP, USING RIGHT INTERNAL MAMMARY ARTERY. (N/A) INTRAOPERATIVE TRANSESOPHAGEAL ECHOCARDIOGRAM (N/A) Subjective: Postop AVR, CABG Extubated < 19m\hrs postop Very weak and sl confused, inappropriate Moves all extrem nsr Drips weaned off  Objective: Vital signs in last 24 hours: Temp:  [96.1 F (35.6 C)-99.5 F (37.5 C)] 99.5 F (37.5 C) (10/31 1000) Pulse Rate:  [79-91] 81 (10/31 1500) Cardiac Rhythm:  [-] Normal sinus rhythm (10/31 1200) Resp:  [11-31] 21 (10/31 1500) BP: (87-144)/(43-98) 139/56 mmHg (10/31 1500) SpO2:  [91 %-100 %] 100 % (10/31 1500) Arterial Line BP: (89-268)/(44-258) 139/60 mmHg (10/31 1200) FiO2 (%):  [40 %-50 %] 50 % (10/31 0928) Weight:  [146 lb 9.7 oz (66.5 kg)] 146 lb 9.7 oz (66.5 kg) (10/31 0500)  Hemodynamic parameters for last 24 hours: PAP: (36-58)/(20-46) 58/32 mmHg CO:  [3.6 L/min-5.4 L/min] 5.1 L/min CI:  [2.3 L/min/m2-3.8 L/min/m2] 3.8 L/min/m2  Intake/Output from previous day: 10/30 0701 - 10/31 0700 In: 6204.4 [I.V.:4439.4; Blood:255; NG/GT:60; IV Piggyback:1450] Out: 2725 [Urine:1575; Emesis/NG output:100; Blood:700; Chest Tube:350] Intake/Output this shift: Total I/O In: 517.3 [I.V.:317.3; IV Piggyback:200] Out: 509 [Urine:349; Chest Tube:160]  EXAM Lungs clear butr distant No murmur  Lab Results:  Recent Labs  07/17/13 2030 07/17/13 2044 07/18/13 0415  WBC 17.4*  --  19.4*  HGB 8.5* 9.2* 8.4*  HCT 26.0* 27.0* 25.7*  PLT 150  --  129*   BMET:  Recent Labs  07/17/13 2044 07/18/13 0415  NA 139 139  K 4.0 4.2  CL 106 107  CO2  --  21  GLUCOSE 156* 119*  BUN 17 19  CREATININE 1.20* 1.04  CALCIUM  --  7.9*    PT/INR:  Recent Labs  07/17/13 1320  LABPROT 17.3*  INR 1.45   ABG    Component Value Date/Time   PHART 7.317* 07/18/2013 0410   HCO3 20.5 07/18/2013 0410   TCO2 22 07/18/2013 0410   ACIDBASEDEF 5.0* 07/18/2013 0410   O2SAT 96.0 07/18/2013 0410   CBG (last 3)   Recent Labs  07/18/13 1124 07/18/13 1220 07/18/13 1318  GLUCAP 128* 122* 125*    Assessment/Plan: S/P Procedure(s) (LRB): AORTIC VALVE REPLACEMENT (AVR) (N/A) CORONARY ARTERY BYPASS GRAFT, TIMES ONE, ON PUMP, USING RIGHT INTERNAL MAMMARY ARTERY. (N/A) INTRAOPERATIVE TRANSESOPHAGEAL ECHOCARDIOGRAM (N/A) See progression orders Follow mental status closely  LOS: 1 day    VAN TRIGT Strong,Desiree 07/18/2013

## 2013-07-18 NOTE — Progress Notes (Signed)
MD notified of pt post extubation ABG. Pt placed on 50% venti mask per MD order to maintain sats > 92%. If pt does not tolerate venti mask, okay to start bipap per MD. No other orders received at this time. Will continue to monitor, pt vs WNL

## 2013-07-18 NOTE — Progress Notes (Signed)
Pt placed back on 50%VM. Pt is tolerating at this time, maintaining sats around 92%. RT will continue to monitor.

## 2013-07-18 NOTE — Op Note (Signed)
NAMEKINGSTON, [name]                ACCOUNT NO.:  [id number]  MEDICAL RECORD NO.:  [id number]  LOCATION:  2S07C                        FACILITY:  [facility]  PHYSICIAN:  Kerin Perna, M.D.  DATE OF BIRTH:  [birth year]  DATE OF PROCEDURE:  07/17/2013 DATE OF DISCHARGE:                              OPERATIVE REPORT   OPERATION: 1. Coronary artery bypass grafting x1 (left internal mammary artery to     LAD). 2. Aortic valve replacement for severe aortic stenosis with a 19 mm     Edwards pericardial valve (serial [id number], Magna Ease model     [id number]).  SURGEON:  Kerin Perna, M.D.  ASSISTANT:  Rowe Clack, P.A.-C.  PREOPERATIVE DIAGNOSES: 1. Severe aortic stenosis with class 3 congestive heart failure. 2. Single-vessel coronary artery disease.  POSTOPERATIVE DIAGNOSES: 1. Severe aortic stenosis with class 3 congestive heart failure. 2. Single-vessel coronary artery disease.  ANESTHESIA:  General by Dr. Caryn Bee Oddi.  INDICATIONS:  The patient is a 79 year old Caucasian female, reformed smoker with progressive shortness of breath and known aortic stenosis. She was felt to be a candidate for aortic valve replacement and left heart cath was performed showing a 70% stenosis in the mid LAD, otherwise no significant CAD.  LV systolic function was fairly well preserved but she had elevated filling pressures.  I examined the patient in the office and reviewed the results of her 2D echocardiogram showing severe aortic stenosis, and her coronary arteriogram showing moderate LAD stenosis and discussed the procedure of aortic valve replacement combined with CABG.  We discussed the plan to use a bioprosthetic valve due to her advanced age of 20 years and to avoid Coumadin requirement.  I discussed the major aspects of surgery including the use of general anesthesia and cardiopulmonary bypass, the location of the surgical incision, and the expected postoperative hospital recovery.  I  discussed with the patient and her husband the risks of stroke, MI, bleeding, blood transfusion requirement, pulmonary problems due to her underlying COPD including ventilator dependence, pleural effusions, pneumonia, and death.  She demonstrated her understanding to this discussion and agreed to proceed with surgery under what I felt was an informed consent.  OPERATIVE FINDINGS: 1. Bilateral emphysema. 2. Severe calcified aortic stenosis. 3. Calcified distal ascending aorta at the proximal arch necessitating     placement of the cannulation and cross clamp in the mid portion of     the ascending aorta. 4. No blood products required for this operation.  PROCEDURE:  The patient was brought into the operating room, placed supine on the operating table.  General anesthesia was induced under invasive hemodynamic monitoring.  The PA pressures were elevated at almost 60/30.  The transesophageal echo probe was placed by the anesthesiologist, which confirmed the preoperative diagnosis of severe aortic stenosis.  The patient was prepped and draped as a sterile field. A proper time-out was performed.  A sternal incision was made and a sternotomy was performed.  The sternal retractor was placed and the pericardium was opened and suspended.  Prior to this, the left internal mammary artery was harvested as a pedicle graft from its origin at the subclavian vessels.  It was  a 1.5-mm vessel with good flow.  Heparin was administered and pursestrings were placed in the ascending aorta, fairly low due to heavy calcification of the distal ascending aorta at the proximal arch.  Pursestring was placed in the right atrium. When the ACT was documented as being therapeutic, the patient was cannulated and placed on cardiopulmonary bypass.  A left ventricular vent was placed via the right superior pulmonary vein.  The LAD was identified for the site of grafting and cardioplegia cannulas were placed both  antegrade and retrograde cold blood cardioplegia.  The patient was cooled to 32 degrees and aortic crossclamp was carefully applied to avoid the area of calcification.  An 1 L of cold blood cardioplegia was delivered in split doses between the antegrade aortic and retrograde coronary sinus catheters.  There was good cardioplegic arrest and septal temperature dropped less than 12 degrees.  Cardioplegia was delivered every 20 minutes.  The distal coronary anastomosis was performed.  The left IMA pedicle was brought through an opening in the left lateral pericardium, was brought down onto the LAD which was 1.5-mm vessel and sewn end-to-side with running 8-0 Prolene.  There was good flow through the anastomosis and the bulldog was reapplied to the pedicle and the pedicle secured the epicardium with 6-0 Prolene.  Cardioplegia was redosed.  An aortotomy was then performed.  Aortic valve was inspected.  It had 3 leaflets all of which were heavily calcified.  The valve was excised and the annulus debrided of calcium.  The anulus was sized to a 19 mm pericardial tissue valve.  The outflow tract was irrigated with copious amounts of cold saline.  Subangular 2-0 Ethibond sutures were placed around the anulus numbering 13 total.  The valve was prepared according to protocol.  The sutures were placed through the sewing ring of the valve and the valve was seated and the sutures were tied.  There was good confirmation of the valve to the anulus and no evidence of spaces between the sutures for perivalvular leak.  The coronary ostia were widely patent.  The aortotomy was closed with a running 4-0 Prolene in 2 layers.  Air was vented from the coronaries with a dose of retrograde warm blood cardioplegia.  The crossclamp was removed.  The heart resumed a spontaneous rhythm and temporary pacing wires were applied after hemostasis was documented at the distal coronary anastomosis and the  aortotomy.  The patient was rewarmed and reperfused.  The lungs were re-expanded and ventilator was resumed.  The patient was started on low-dose milrinone and dopamine and subsequently weaned off cardiopulmonary bypass easily. The echo showed good ventricular function and the echocardiogram showed good function of the aortic valve without aortic insufficiency. Protamine was administered without adverse reaction.  The cannulas were removed.  The mediastinum was irrigated.  The superior pericardial fat was closed over the aorta.  The anterior mediastinal and left pleural chest tube were placed and brought through separate incisions. The sternum was closed with wire.  The pectoralis fascia was closed in running #1 Vicryl.  The subcutaneous and skin layers were closed in running Vicryl.  Total cardiopulmonary bypass time was 130 minutes.     Kerin Perna, M.D.     PV/MEDQ  D:  07/17/2013  T:  07/18/2013  Job:  [id number]  cc:   Madolyn Frieze. Jens Som, MD, [facility]

## 2013-07-19 ENCOUNTER — Inpatient Hospital Stay (HOSPITAL_COMMUNITY): Payer: Medicare Other

## 2013-07-19 LAB — GLUCOSE, CAPILLARY
Glucose-Capillary: 109 mg/dL — ABNORMAL HIGH (ref 70–99)
Glucose-Capillary: 80 mg/dL (ref 70–99)
Glucose-Capillary: 98 mg/dL (ref 70–99)
Glucose-Capillary: 108 mg/dL — ABNORMAL HIGH (ref 70–99)
Glucose-Capillary: 119 mg/dL — ABNORMAL HIGH (ref 70–99)
Glucose-Capillary: 110 mg/dL — ABNORMAL HIGH (ref 70–99)
Glucose-Capillary: 106 mg/dL — ABNORMAL HIGH (ref 70–99)

## 2013-07-19 LAB — POCT I-STAT 3, ART BLOOD GAS (G3+)
Acid-base deficit: 5 mmol/L — ABNORMAL HIGH (ref 0.0–2.0)
Bicarbonate: 21 mEq/L (ref 20.0–24.0)
O2 Saturation: 96 %
Patient temperature: 98.7
TCO2: 22 mmol/L (ref 0–100)
pCO2 arterial: 40.6 mmHg (ref 35.0–45.0)
pH, Arterial: 7.322 — ABNORMAL LOW (ref 7.350–7.450)
pO2, Arterial: 85 mmHg (ref 80.0–100.0)

## 2013-07-19 LAB — COMPREHENSIVE METABOLIC PANEL
ALT: 10 U/L (ref 0–35)
AST: 27 U/L (ref 0–37)
Albumin: 3.3 g/dL — ABNORMAL LOW (ref 3.5–5.2)
Alkaline Phosphatase: 54 U/L (ref 39–117)
BUN: 23 mg/dL (ref 6–23)
CO2: 23 mEq/L (ref 19–32)
Calcium: 8.5 mg/dL (ref 8.4–10.5)
Chloride: 104 mEq/L (ref 96–112)
Creatinine, Ser: 1.49 mg/dL — ABNORMAL HIGH (ref 0.50–1.10)
GFR calc Af Amer: 38 mL/min — ABNORMAL LOW (ref >90)
GFR calc non Af Amer: 32 mL/min — ABNORMAL LOW (ref >90)
Glucose, Bld: 98 mg/dL (ref 70–99)
Potassium: 4.6 mEq/L (ref 3.5–5.1)
Sodium: 137 mEq/L (ref 135–145)
Total Bilirubin: 0.2 mg/dL — ABNORMAL LOW (ref 0.3–1.2)
Total Protein: 6.2 g/dL (ref 6.0–8.3)

## 2013-07-19 LAB — CBC
HCT: 26.6 % — ABNORMAL LOW (ref 36.0–46.0)
Hemoglobin: 8.7 g/dL — ABNORMAL LOW (ref 12.0–15.0)
MCH: 29.5 pg (ref 26.0–34.0)
MCHC: 32.7 g/dL (ref 30.0–36.0)
MCV: 90.2 fL (ref 78.0–100.0)
Platelets: 117 10*3/uL — ABNORMAL LOW (ref 150–400)
RBC: 2.95 MIL/uL — ABNORMAL LOW (ref 3.87–5.11)
RDW: 15 % (ref 11.5–15.5)
WBC: 17.9 10*3/uL — ABNORMAL HIGH (ref 4.0–10.5)

## 2013-07-19 MED ORDER — LABETALOL HCL 5 MG/ML IV SOLN
10.0000 mg | INTRAVENOUS | Status: DC | PRN
  Administered 2013-07-19 – 2013-07-21 (×5): 10 mg via INTRAVENOUS
  Filled 2013-07-19 (×5): qty 4

## 2013-07-19 MED ORDER — PANTOPRAZOLE SODIUM 40 MG IV SOLR
40.0000 mg | INTRAVENOUS | Status: DC
  Filled 2013-07-19: qty 40

## 2013-07-19 MED ORDER — FUROSEMIDE 10 MG/ML IJ SOLN
20.0000 mg | Freq: Once | INTRAMUSCULAR | Status: AC
  Administered 2013-07-19: 20 mg via INTRAVENOUS

## 2013-07-19 MED ORDER — LEVALBUTEROL HCL 1.25 MG/0.5ML IN NEBU
1.2500 mg | INHALATION_SOLUTION | Freq: Four times a day (QID) | RESPIRATORY_TRACT | Status: DC
  Administered 2013-07-19 – 2013-07-20 (×5): 1.25 mg via RESPIRATORY_TRACT
  Filled 2013-07-19 (×8): qty 0.5

## 2013-07-19 NOTE — Progress Notes (Signed)
2 Days Post-Op Procedure(s) (LRB): AORTIC VALVE REPLACEMENT (AVR) (N/A) CORONARY ARTERY BYPASS GRAFT, TIMES ONE, ON PUMP, USING RIGHT INTERNAL MAMMARY ARTERY. (N/A) INTRAOPERATIVE TRANSESOPHAGEAL ECHOCARDIOGRAM (N/A) Subjective:  Patient remains hemodynamically stable in sinus rhythm with adequate O2 saturation  Mental status remains abnormal as well as generalized weakness and inability to walk. Patient moved by nurses to chair today.  Head CT scan performed this morning shows no evidence of acute stroke. Possible old lacunar infarcts present. Swallow assessment performed and recommendation is to keep n.p.o. except for ice chips.  Objective: Vital signs in last 24 hours: Temp:  [97.4 F (36.3 C)-98.6 F (37 C)] 98.3 F (36.8 C) (11/01 1229) Pulse Rate:  [70-89] 72 (11/01 1210) Cardiac Rhythm:  [-] Normal sinus rhythm (11/01 1200) Resp:  [12-38] 14 (11/01 1210) BP: (114-178)/(41-95) 154/70 mmHg (11/01 1200) SpO2:  [95 %-100 %] 97 % (11/01 1229) Arterial Line BP: (82-153)/(56-69) 82/69 mmHg (10/31 1600) FiO2 (%):  [50 %] 50 % (11/01 0800) Weight:  [141 lb 9.6 oz (64.229 kg)] 141 lb 9.6 oz (64.229 kg) (11/01 0400)  Hemodynamic parameters for last 24 hours:   sinus rhythm  Intake/Output from previous day: 10/31 0701 - 11/01 0700 In: 1138.6 [I.V.:638.6; IV Piggyback:500] Out: 920 [Urine:700; Chest Tube:220] Intake/Output this shift: Total I/O In: 60 [I.V.:60] Out: 130 [Urine:100; Chest Tube:30]  General exam  Patient is responsive but drowsy Patient will move extremities to command but somewhat in- different Lungs clear Soft flow murmur through aortic valve No abdominal tenderness Lab Results:  Recent Labs  07/18/13 1614 07/18/13 1638 07/19/13 0415  WBC 21.6*  --  17.9*  HGB 8.9* 15.3* 8.7*  HCT 27.2* 45.0 26.6*  PLT 121*  --  117*   BMET:  Recent Labs  07/18/13 0415  07/18/13 1638 07/19/13 0415  NA 139  --  138 137  K 4.2  --  4.4 4.6  CL 107  --  106 104   CO2 21  --   --  23  GLUCOSE 119*  --  124* 98  BUN 19  --  19 23  CREATININE 1.04  < > 1.40* 1.49*  CALCIUM 7.9*  --   --  8.5  < > = values in this interval not displayed.  PT/INR:  Recent Labs  07/17/13 1320  LABPROT 17.3*  INR 1.45   ABG    Component Value Date/Time   PHART 7.322* 07/19/2013 0504   HCO3 21.0 07/19/2013 0504   TCO2 22 07/19/2013 0504   ACIDBASEDEF 5.0* 07/19/2013 0504   O2SAT 96.0 07/19/2013 0504   CBG (last 3)   Recent Labs  07/19/13 0740 07/19/13 1115 07/19/13 1227  GLUCAP 98 108* 119*    Assessment/Plan: S/P Procedure(s) (LRB): AORTIC VALVE REPLACEMENT (AVR) (N/A) CORONARY ARTERY BYPASS GRAFT, TIMES ONE, ON PUMP, USING RIGHT INTERNAL MAMMARY ARTERY. (N/A) INTRAOPERATIVE TRANSESOPHAGEAL ECHOCARDIOGRAM (N/A) Postop aVR CABG with slow progress due to altered mental status and overall weakness and lethargy Keep in ICU and continue to support Repeat swallow assessment with possible modified barium swallow before starting diet   LOS: 2 days    VAN TRIGT III,[name] 07/19/2013

## 2013-07-19 NOTE — Progress Notes (Signed)
Dr Donata Clay updated pt status. Updated pt tolerated, did well with initial speech consult, was more alert with speech and husband at bedside. MD updated results of CT scan. MD updated attempted to give PO meds and pt with small cough after each pill, even with applesauce. Speech therapy still at bedside, tried thickened liquids, pt still with slight cough and has now developed expiratory wheeze bilaterally. MD stated to keep NPO/hold PO meds, ok for ice chips. Speech will see pt again tomorrow, possible modified barium if needed per speech recommendation/communication with MD. IV lasix and xopenex neb ordered. Will continue to monitor. Koren Bound

## 2013-07-19 NOTE — Progress Notes (Signed)
Dr Donata Clay updated at bedside. Updated pt oriented to person, place, disoriented to time. Pt slow to respond to questions, at times repeats question back instead of answering question. Follows commands intermittently.  Per report from 7p shift, pt needing prompting to swallow, holding pills in mouth, poor appetite. Will hold POs/clear liquids until speech therapy assessment. Orders for head CT. MD stated to continue foley catheter and continue Right IJ sleeve for today. Updated per report, pt 3 assist to chair this morning. Needed prompting to take a step, pt appears to have questionable drop foot bilaterally, feet pointed down at rest in chair. Will continue to monitor. Koren Bound

## 2013-07-19 NOTE — Evaluation (Signed)
Clinical/Bedside Swallow Evaluation Patient Details  Name: [name] MRN: [id number] Date of Birth: Nov 21, 1931  Today's Date: 07/19/2013 Time: 1036-1056; 1120-1130 SLP Time Calculation (min): 20 min; 10 min  Past Medical History:  Past Medical History  Diagnosis Date  . Hypertension   . Asthma     uses inhaler as needed  . Diabetes mellitus   . GERD (gastroesophageal reflux disease)   . Arthritis   . Anxiety   . Depression   . Hyperlipidemia   . Emphysema of lung   . Glaucoma   . [name]   . Aortic stenosis    Past Surgical History:  Past Surgical History  Procedure Laterality Date  . Ankle surgery  1998    d/t fx.  Left ankle  . Facial cosmetic surgery      face lift  . Nose surgery    . Laparoscopic assisted vaginal hysterectomy  06/21/2011    Procedure: LAPAROSCOPIC ASSISTED VAGINAL HYSTERECTOMY;  Surgeon: Freddy Finner;  Location: [facility];  Service: Gynecology;  Laterality: N/A;  . Salpingoophorectomy  06/21/2011    Procedure: SALPINGO OOPHERECTOMY;  Surgeon: Freddy Finner;  Location: [facility];  Service: Gynecology;  Laterality: Bilateral;  . Anterior and posterior repair  06/21/2011    Procedure: ANTERIOR (CYSTOCELE) AND POSTERIOR REPAIR (RECTOCELE);  Surgeon: Freddy Finner;  Location: [facility];  Service: Gynecology;  Laterality: N/A;  . Vaginal prolapse repair  06/21/2011    Procedure: SACROPEXY;  Surgeon: Freddy Finner;  Location: [facility];  Service: Gynecology;  Laterality: N/A;  sacrospinous ligament suspension  . Cardiac catheterization    . Aortic valve replacement N/A 07/17/2013    Procedure: AORTIC VALVE REPLACEMENT (AVR);  Surgeon: Kerin Perna, MD;  Location: [facility] OR;  Service: Open Heart Surgery;  Laterality: N/A;  . Coronary artery bypass graft N/A 07/17/2013    Procedure: CORONARY ARTERY BYPASS GRAFT, TIMES ONE, ON PUMP, USING RIGHT INTERNAL MAMMARY ARTERY.;  Surgeon: Kerin Perna, MD;  Location: [facility] OR;  Service: Open Heart Surgery;   Laterality: N/A;  . Intraoperative transesophageal echocardiogram N/A 07/17/2013    Procedure: INTRAOPERATIVE TRANSESOPHAGEAL ECHOCARDIOGRAM;  Surgeon: Kerin Perna, MD;  Location: [facility] OR;  Service: Open Heart Surgery;  Laterality: N/A;   HPI:  79 y.o. female admitted for aortic valve replacement and CABG x1.  Confused post-surgery, oral holding of POs noted last pm at mealtime.   CT negative for acute abnormality.     Assessment / Plan / Recommendation Clinical Impression  Bedside swallow eval completed with husband present.  Pt more alert this am, responsive to spouse and performed well on swallow assessment with no overt s/s of aspiration.  While documenting outside room, observed pt consuming meds with RN and demonstrating poor toleration of POs.  Swallow assessment was repeated with notable deteriorated function - purees, thin liquids, and nectar-thick liquids elicited an immediate and consistent cough response, concerning for aspiration.  Pt with expiratory wheezing.  Mental status is sluggish; delayed response time and questionable inattention to left noted.    Recommend NPO except ice chips; SLP will return next date to eval for improved toleration and necessity of instrumental swallow study.      Aspiration Risk  Mod-severe    Diet Recommendation NPO except ice chips     Other  Recommendations Oral Care Recommendations: Oral care BID   Follow Up Recommendations  SLP           SLP Swallow Goals  see care plan   Swallow Study Prior Functional Status   wnl    General Date of Onset: 07/17/13 Type of Study: Bedside swallow evaluation Previous Swallow Assessment: none per records Diet Prior to this Study: Regular;Thin liquids Temperature Spikes Noted: No Respiratory Status: Nasal cannula (6 liters) History of Recent Intubation: Yes Length of Intubations (days): 1 days Date extubated: 07/17/13 Behavior/Cognition: Confused Oral Cavity - Dentition: Dentures,  top;Dentures, bottom Self-Feeding Abilities: Able to feed self Patient Positioning: Upright in bed Baseline Vocal Quality: Clear Volitional Cough: Strong Volitional Swallow: Able to elicit    Oral/Motor/Sensory Function Overall Oral Motor/Sensory Function: Appears within functional limits for tasks assessed   Ice Chips Ice chips: Within functional limits   Thin Liquid Thin Liquid: impaired - throat-clearing and cough Presentation: Cup;Straw    Nectar Thick Nectar Thick Liquid: impaired; throat-clearing and cough    Honey Thick Honey Thick Liquid: Not tested   Puree Puree: impaired; throat-clearing and cough   Solid      Solid: Within functional limits initially with deterioration as lethargy increased Presentation: Self Fed      [name], [place] CCC/SLP Pager [phone]  Blenda Mounts Laurice 07/19/2013,11:05 AM

## 2013-07-19 NOTE — Evaluation (Signed)
Physical Therapy Evaluation Patient Details Name: [name] MRN: [id number] DOB: 1932-02-16 Today's Date: 07/19/2013 Time: 7829-5621 PT Time Calculation (min): 23 min  PT Assessment / Plan / Recommendation History of Present Illness  [name]  has presented for surgery, with the diagnosis of Chest pain  s/p AVR, CABG. Pt with AMS postop with decreased awareness and ability to follow commands and well as generalized weakness  Clinical Impression  Pt is a pleasantly confused 79yo who is able to recall place and situation but disoriented to time, poor STM, unable to follow commands consistently and decreased attention who also demonstrates generalized weakness. Pt with increased plantarflexion right foot with inability to achieve neutral passively. LLE with plantarflexion at rest but able to achieve neutral passively and slight dorsiflexion, however noted to have clonus in LLE with 4-12 beats. Pt with these as well as below deficits and will benefit from acute therapy to maximize mobility, function, transfers and activity to decrease burden of care. Pending pt progression and ability to follow commands she may benefit from CIR at discharge vs SNF.    PT Assessment  Patient needs continued PT services    Follow Up Recommendations  CIR;SNF;Supervision/Assistance - 24 hour (to be determined based on pt progression)    Does the patient have the potential to tolerate intense rehabilitation      Barriers to Discharge Decreased caregiver support      Equipment Recommendations  Other (comment) (TBD)    Recommendations for Other Services OT consult . Recommend bil PRAFO  Frequency Min 3X/week    Precautions / Restrictions Precautions Precautions: Sternal;Fall   Pertinent Vitals/Pain 98% on 5L at rest, attempted 2L but sats dropped to 88% and on 4L able to maintain 92% HR 84 Pt reports some pain in chest but could not rate or localize and varied response of no to some pain throughout  session      Mobility  Bed Mobility Bed Mobility: Rolling Right;Right Sidelying to Sit Rolling Right: 1: +1 Total assist Right Sidelying to Sit: 1: +1 Total assist;HOB elevated Details for Bed Mobility Assistance: cueing for sequence and assist. Even with assist to bend knee and cross arm over body pt lacked initiation to roll and performed with assist of pad Transfers Transfers: Sit to Stand;Stand to Sit;Squat Pivot Transfers Sit to Stand: 1: +1 Total assist;From bed;From elevated surface Stand to Sit: 1: +1 Total assist;To chair/3-in-1 Squat Pivot Transfers: 1: +1 Total assist;From elevated surface Details for Transfer Assistance: max cueing and assist for hands on thighs with pt able to assist with anterior translation but required bil knees blocked to achieve weight on feet and pivot to chair. Pt maintains crouched posture in standing and did not attempt to correct even with max multimodal cues. Pt maintaining excessive plantarflexion on Right foot and would not put heel down in standing. Attempted pivot 2x before completion due to pt scissoring feet on first attempt Ambulation/Gait Ambulation/Gait Assistance: Not tested (comment)    Exercises     PT Diagnosis: Difficulty walking;Generalized weakness;Altered mental status  PT Problem List: Decreased strength;Decreased cognition;Decreased range of motion;Decreased knowledge of use of DME;Decreased activity tolerance;Decreased safety awareness;Decreased balance;Decreased knowledge of precautions;Decreased mobility;Cardiopulmonary status limiting activity;Decreased coordination PT Treatment Interventions: Gait training;DME instruction;Functional mobility training;Therapeutic activities;Therapeutic exercise;Patient/family education;Cognitive remediation;Neuromuscular re-education;Balance training     PT Goals(Current goals can be found in the care plan section) Acute Rehab PT Goals PT Goal Formulation: Patient unable to participate in  goal setting Time For Goal Achievement:  08/02/13 Potential to Achieve Goals: Fair  Visit Information  Last PT Received On: 07/19/13 Assistance Needed: +2 History of Present Illness: [name]  has presented for surgery, with the diagnosis of Chest pain  s/p AVR, CABG. Pt with AMS postop with decreased awareness and ability to follow commands and well as generalized weakness       Prior Functioning  Home Living Family/patient expects to be discharged to:: Private residence Living Arrangements: Spouse/significant other Home Access: Stairs to enter [employer] of Steps: 3 Additional Comments: pt unable to provide PLOF. per RN spouse reported pt is independent at home without use of DME. Spouse assists on steps into home only Prior Function Level of Independence: Independent Communication Communication: No difficulties    Cognition  Cognition Arousal/Alertness: Awake/alert Behavior During Therapy: Flat affect Overall Cognitive Status: Impaired/Different from baseline Area of Impairment: Orientation;Attention;Memory;Following commands;Safety/judgement;Awareness;Problem solving Orientation Level: Time Current Attention Level: Focused Memory: Decreased recall of precautions Following Commands: Follows one step commands inconsistently;Follows one step commands with increased time Safety/Judgement: Decreased awareness of safety;Decreased awareness of deficits Problem Solving: Slow processing;Decreased initiation;Difficulty sequencing;Requires verbal cues;Requires tactile cues General Comments: Pt with decreased attention with relatively blank stare throughout but would track if instructed to do so. Pt multiple times just repeating question rather than answering it. Pt unable to recall day of week, precautions or education and only follow commands grossly 20% of session    Extremity/Trunk Assessment Upper Extremity Assessment Upper Extremity Assessment: Generalized  weakness Lower Extremity Assessment Lower Extremity Assessment: Generalized weakness   Balance Balance Balance Assessed: Yes Static Sitting Balance Static Sitting - Balance Support: Feet supported;Bilateral upper extremity supported Static Sitting - Level of Assistance: 3: Mod assist Static Sitting - Comment/# of Minutes: 5 min pt initially mod assist to achieve midline with progression to min assist and a few seconds of minguard achieved  End of Session PT - End of Session Equipment Utilized During Treatment: Gait belt Activity Tolerance: Patient limited by fatigue;Other (comment) (AMS) Patient left: in chair;with call bell/phone within reach;with nursing/sitter in room Nurse Communication: Mobility status  GP     Delorse Lek 07/19/2013, 2:34 PM Delaney Meigs, PT [phone]

## 2013-07-20 ENCOUNTER — Inpatient Hospital Stay (HOSPITAL_COMMUNITY): Payer: Medicare Other

## 2013-07-20 LAB — BASIC METABOLIC PANEL
BUN: 30 mg/dL — ABNORMAL HIGH (ref 6–23)
CO2: 22 mEq/L (ref 19–32)
Calcium: 8.4 mg/dL (ref 8.4–10.5)
Chloride: 102 mEq/L (ref 96–112)
Creatinine, Ser: 1.3 mg/dL — ABNORMAL HIGH (ref 0.50–1.10)
GFR calc Af Amer: 44 mL/min — ABNORMAL LOW (ref >90)
GFR calc non Af Amer: 38 mL/min — ABNORMAL LOW (ref >90)
Glucose, Bld: 109 mg/dL — ABNORMAL HIGH (ref 70–99)
Potassium: 4.2 mEq/L (ref 3.5–5.1)
Sodium: 135 mEq/L (ref 135–145)

## 2013-07-20 LAB — CBC
HCT: 26 % — ABNORMAL LOW (ref 36.0–46.0)
Hemoglobin: 8.5 g/dL — ABNORMAL LOW (ref 12.0–15.0)
MCH: 29.3 pg (ref 26.0–34.0)
MCHC: 32.7 g/dL (ref 30.0–36.0)
MCV: 89.7 fL (ref 78.0–100.0)
Platelets: 111 10*3/uL — ABNORMAL LOW (ref 150–400)
RBC: 2.9 MIL/uL — ABNORMAL LOW (ref 3.87–5.11)
RDW: 14.8 % (ref 11.5–15.5)
WBC: 14.7 10*3/uL — ABNORMAL HIGH (ref 4.0–10.5)

## 2013-07-20 LAB — GLUCOSE, CAPILLARY
Glucose-Capillary: 116 mg/dL — ABNORMAL HIGH (ref 70–99)
Glucose-Capillary: 101 mg/dL — ABNORMAL HIGH (ref 70–99)
Glucose-Capillary: 97 mg/dL (ref 70–99)
Glucose-Capillary: 143 mg/dL — ABNORMAL HIGH (ref 70–99)
Glucose-Capillary: 113 mg/dL — ABNORMAL HIGH (ref 70–99)
Glucose-Capillary: 162 mg/dL — ABNORMAL HIGH (ref 70–99)

## 2013-07-20 MED ORDER — IPRATROPIUM-ALBUTEROL 20-100 MCG/ACT IN AERS
2.0000 | INHALATION_SPRAY | RESPIRATORY_TRACT | Status: DC
  Administered 2013-07-20 – 2013-07-22 (×10): 2 via RESPIRATORY_TRACT
  Filled 2013-07-20: qty 4

## 2013-07-20 MED ORDER — FUROSEMIDE 10 MG/ML IJ SOLN
20.0000 mg | Freq: Once | INTRAMUSCULAR | Status: AC
  Administered 2013-07-20: 20 mg via INTRAVENOUS

## 2013-07-20 MED ORDER — STARCH (THICKENING) PO POWD
ORAL | Status: DC | PRN
  Filled 2013-07-20: qty 227

## 2013-07-20 MED ORDER — PANTOPRAZOLE SODIUM 40 MG PO TBEC
40.0000 mg | DELAYED_RELEASE_TABLET | Freq: Every day | ORAL | Status: DC
  Administered 2013-07-20 – 2013-07-24 (×5): 40 mg via ORAL
  Filled 2013-07-20 (×5): qty 1

## 2013-07-20 MED ORDER — FUROSEMIDE 10 MG/ML IJ SOLN
20.0000 mg | Freq: Every day | INTRAMUSCULAR | Status: DC
  Administered 2013-07-20 – 2013-07-21 (×2): 20 mg via INTRAVENOUS
  Filled 2013-07-20 (×3): qty 2

## 2013-07-20 MED ORDER — METOPROLOL TARTRATE 12.5 MG HALF TABLET
12.5000 mg | ORAL_TABLET | Freq: Two times a day (BID) | ORAL | Status: DC
  Administered 2013-07-20 – 2013-07-22 (×6): 12.5 mg via ORAL
  Filled 2013-07-20 (×9): qty 1

## 2013-07-20 MED ORDER — LEVALBUTEROL HCL 1.25 MG/0.5ML IN NEBU
1.2500 mg | INHALATION_SOLUTION | Freq: Four times a day (QID) | RESPIRATORY_TRACT | Status: DC | PRN
  Administered 2013-07-20: 1.25 mg via RESPIRATORY_TRACT
  Filled 2013-07-20: qty 0.5

## 2013-07-20 MED ORDER — HYDRALAZINE HCL 20 MG/ML IJ SOLN
10.0000 mg | INTRAMUSCULAR | Status: DC | PRN
  Administered 2013-07-20: 10 mg via INTRAVENOUS
  Filled 2013-07-20: qty 1

## 2013-07-20 MED ORDER — AMLODIPINE BESYLATE 5 MG PO TABS
5.0000 mg | ORAL_TABLET | Freq: Every day | ORAL | Status: DC
  Administered 2013-07-20 – 2013-07-24 (×5): 5 mg via ORAL
  Filled 2013-07-20 (×5): qty 1

## 2013-07-20 MED ORDER — IRBESARTAN 75 MG PO TABS
75.0000 mg | ORAL_TABLET | Freq: Every day | ORAL | Status: DC
  Administered 2013-07-20 – 2013-07-24 (×5): 75 mg via ORAL
  Filled 2013-07-20 (×5): qty 1

## 2013-07-20 NOTE — Progress Notes (Signed)
Dr Donata Clay updated at bedside. Updated pt currently NPO except for ice chips. MD ok to start dysphagia 2 diet with nectar thick liquids, meds in applesauce. MD updated BP remains >150, giving PRN labetalol, MD to add IV hydralazine PRN and start PO antihypertensives, will continue to monitor. Koren Bound

## 2013-07-20 NOTE — Progress Notes (Signed)
Pt given scheduled PO medications, in applesauce, 1 at a time. Pt appeared to swallow with no difficulty, no coughing after PO intake. Nectar thick liquids used to swallow with pills in applesauce, will continue to monitor. Koren Bound

## 2013-07-20 NOTE — Progress Notes (Addendum)
Speech Language Pathology Treatment: Dysphagia  Patient Details Name: [name] MRN: [id number] DOB: [birth year] Today's Date: 07/20/2013 Time: 1600-1630 SLP Time Calculation (min): 30 min  Assessment / Plan / Recommendation Clinical Impression  F/u from initial BSE to assess swallow for PO readiness due to recommendations of NPO.  Dysphagia 2 diet consistency ( finely chopped)  and nectar thick liquids initiated this am by MD as LOA improved.  Observed directly with puree consistency and nectar thick liquids by spoon and cup.  Increased WOB and change in RR s/p PO trials.  No trials of solids attempted as patient noted to fatigue easily.  Slight wet vocal quality with delayed throat clears noted with sips of NTL by cup indicating possible penetration to cords.  Due to present decreased reserve recommend to downgrade diet to dysphagia 1(puree) and continue NTL in small amounts with rest breaks throughout PO intake.  [name]  Recommend full supervision with all PO's to assist PRN as aspiration risk remains due to overall weakness.  Patient demonstrated understanding of recommendations.    ST to continue in acute care setting for diet tolerance and advancement.     HPI HPI: 79 y/o female admitted for aortic valve replacement and CABG x1.     Pertinent Vitals RR lower 20's with PO trials  SLP Plan  Goals updated    Recommendations Diet recommendations: Dysphagia 1 (puree);Nectar-thick liquid Liquids provided via: Cup Medication Administration: Whole meds with puree Supervision: Full supervision/cueing for compensatory strategies Compensations: Slow rate;Small sips/bites;Clear throat intermittently Postural Changes and/or Swallow Maneuvers: Seated upright 90 degrees;Upright 30-60 min after meal              General recommendations: Rehab consult Oral Care Recommendations: Oral care Q4 per protocol Follow up Recommendations: Inpatient Rehab Plan: Goals updated    GO    Moreen Fowler MS,  CCC-SLP [phone] [facility] 07/20/2013, 4:58 PM

## 2013-07-20 NOTE — Progress Notes (Signed)
3 Days Post-Op Procedure(s) (LRB): AORTIC VALVE REPLACEMENT (AVR) (N/A) CORONARY ARTERY BYPASS GRAFT, TIMES ONE, ON PUMP, USING RIGHT INTERNAL MAMMARY ARTERY. (N/A) INTRAOPERATIVE TRANSESOPHAGEAL ECHOCARDIOGRAM (N/A) Subjective: Patient brighter and more interactive today and able to take some by mouth meds and diet Blood pressure is elevated but will resume oral antihypertensive meds Physical therapy evaluated patient and felt she was candidate for inpatient rehabilitation Maintaining sinus rhythm, pulmonary status stable Diabetic control adequate with sliding scale and low-dose Lantus  Objective: Vital signs in last 24 hours: Temp:  [97.3 F (36.3 C)-97.9 F (36.6 C)] 97.8 F (36.6 C) (11/02 1216) Pulse Rate:  [63-84] 73 (11/02 1200) Cardiac Rhythm:  [-] Normal sinus rhythm (11/02 1200) Resp:  [13-23] 21 (11/02 1200) BP: (133-194)/(51-78) 144/54 mmHg (11/02 1200) SpO2:  [94 %-100 %] 96 % (11/02 1330) FiO2 (%):  [36 %] 36 % (11/02 1330) Weight:  [142 lb 10.2 oz (64.7 kg)] 142 lb 10.2 oz (64.7 kg) (11/02 0500)  Hemodynamic parameters for last 24 hours:   sinus rhythm afebrile  Intake/Output from previous day: 11/01 0701 - 11/02 0700 In: 460 [I.V.:460] Out: 2030 [Urine:1990; Chest Tube:40] Intake/Output this shift: Total I/O In: 80 [I.V.:80] Out: 1185 [Urine:1185]  Generally weak, speech and mentation improved Lungs clear No murmur Sternal incision well-clean dry Lab Results:  Recent Labs  07/19/13 0415 07/20/13 0415  WBC 17.9* 14.7*  HGB 8.7* 8.5*  HCT 26.6* 26.0*  PLT 117* 111*   BMET:  Recent Labs  07/19/13 0415 07/20/13 0415  NA 137 135  K 4.6 4.2  CL 104 102  CO2 23 22  GLUCOSE 98 109*  BUN 23 30*  CREATININE 1.49* 1.30*  CALCIUM 8.5 8.4    PT/INR: No results found for this basename: LABPROT, INR,  in the last 72 hours ABG    Component Value Date/Time   PHART 7.322* 07/19/2013 0504   HCO3 21.0 07/19/2013 0504   TCO2 22 07/19/2013 0504   ACIDBASEDEF 5.0* 07/19/2013 0504   O2SAT 96.0 07/19/2013 0504   CBG (last 3)   Recent Labs  07/20/13 0420 07/20/13 0734 07/20/13 1215  GLUCAP 101* 97 143*    Assessment/Plan: S/P Procedure(s) (LRB): AORTIC VALVE REPLACEMENT (AVR) (N/A) CORONARY ARTERY BYPASS GRAFT, TIMES ONE, ON PUMP, USING RIGHT INTERNAL MAMMARY ARTERY. (N/A) INTRAOPERATIVE TRANSESOPHAGEAL ECHOCARDIOGRAM (N/A)  Mild postoperative confusion improved Continue to advance diet and follow with speech pathology Controlled blood pressure with resuming preop meds Controlled diabetes with sliding scale and Lantus Keep in ICU until she gets stronger   LOS: 3 days    VAN TRIGT III,Alistair Senft 07/20/2013

## 2013-07-21 ENCOUNTER — Inpatient Hospital Stay (HOSPITAL_COMMUNITY): Payer: Medicare Other

## 2013-07-21 DIAGNOSIS — R5381 Other malaise: Secondary | ICD-10-CM

## 2013-07-21 DIAGNOSIS — I2510 Atherosclerotic heart disease of native coronary artery without angina pectoris: Secondary | ICD-10-CM

## 2013-07-21 LAB — GLUCOSE, CAPILLARY
Glucose-Capillary: 73 mg/dL (ref 70–99)
Glucose-Capillary: 75 mg/dL (ref 70–99)
Glucose-Capillary: 77 mg/dL (ref 70–99)
Glucose-Capillary: 162 mg/dL — ABNORMAL HIGH (ref 70–99)
Glucose-Capillary: 137 mg/dL — ABNORMAL HIGH (ref 70–99)
Glucose-Capillary: 79 mg/dL (ref 70–99)
Glucose-Capillary: 178 mg/dL — ABNORMAL HIGH (ref 70–99)

## 2013-07-21 LAB — BASIC METABOLIC PANEL
BUN: 35 mg/dL — ABNORMAL HIGH (ref 6–23)
CO2: 26 mEq/L (ref 19–32)
Calcium: 9.1 mg/dL (ref 8.4–10.5)
Chloride: 102 mEq/L (ref 96–112)
Creatinine, Ser: 1.16 mg/dL — ABNORMAL HIGH (ref 0.50–1.10)
GFR calc Af Amer: 51 mL/min — ABNORMAL LOW (ref >90)
GFR calc non Af Amer: 44 mL/min — ABNORMAL LOW (ref >90)
Glucose, Bld: 83 mg/dL (ref 70–99)
Potassium: 3.4 mEq/L — ABNORMAL LOW (ref 3.5–5.1)
Sodium: 141 mEq/L (ref 135–145)

## 2013-07-21 LAB — CBC
HCT: 31.6 % — ABNORMAL LOW (ref 36.0–46.0)
Hemoglobin: 10.4 g/dL — ABNORMAL LOW (ref 12.0–15.0)
MCH: 29.2 pg (ref 26.0–34.0)
MCHC: 32.9 g/dL (ref 30.0–36.0)
MCV: 88.8 fL (ref 78.0–100.0)
Platelets: 168 10*3/uL (ref 150–400)
RBC: 3.56 MIL/uL — ABNORMAL LOW (ref 3.87–5.11)
RDW: 14.7 % (ref 11.5–15.5)
WBC: 14.8 10*3/uL — ABNORMAL HIGH (ref 4.0–10.5)

## 2013-07-21 MED ORDER — POTASSIUM CHLORIDE ER 10 MEQ PO TBCR
10.0000 meq | EXTENDED_RELEASE_TABLET | Freq: Every day | ORAL | Status: DC
  Administered 2013-07-21 – 2013-07-24 (×4): 10 meq via ORAL
  Filled 2013-07-21 (×5): qty 1

## 2013-07-21 NOTE — Progress Notes (Signed)
I await OT eval and further therapy progress to begin insurance authorization with [facility] for a possible inpt rehab admission when appropriate and if insurance approves. [phone]

## 2013-07-21 NOTE — Consult Note (Signed)
Physical Medicine and Rehabilitation Consult Reason for Consult: Deconditioning after CABG aortic valve replacement Referring Physician: Dr. Zenaida Niece trigt   HPI: [name] is a 79 y.o. right-handed female admitted 07/17/2013 refill nonsmoker admitted with progressive shortness of breath/chest pain and known history of aortic stenosis. Cardiac catheterization showed 70% stenosis in the mid LAD and LV systolic function fairly well preserved. Underwent aortic valve replacement/CABG x1 07/17/2013 per Dr. Zenaida Niece trigt. Postoperatively with bouts of confusion decreased awareness. Cranial CT scan showed no acute abnormalities. Followup speech therapy for dysphagia currently on a dysphagia 1 nectar thick liquid diet. Physical therapy evaluation completed 07/19/2013 with recommendations for physical medicine rehabilitation consult to consider inpatient rehabilitation services.   Review of Systems  Respiratory: Positive for shortness of breath.   Cardiovascular: Positive for chest pain and palpitations.  Gastrointestinal:       GERD  Musculoskeletal: Positive for myalgias.  Psychiatric/Behavioral: Positive for depression.       Anxiety  All other systems reviewed and are negative.   Past Medical History  Diagnosis Date  . Hypertension   . Asthma     uses inhaler as needed  . Diabetes mellitus   . GERD (gastroesophageal reflux disease)   . Arthritis   . Anxiety   . Depression   . Hyperlipidemia   . Emphysema of lung   . Glaucoma   . [name]   . Aortic stenosis    Past Surgical History  Procedure Laterality Date  . Ankle surgery  1998    d/t fx.  Left ankle  . Facial cosmetic surgery      face lift  . Nose surgery    . Laparoscopic assisted vaginal hysterectomy  06/21/2011    Procedure: LAPAROSCOPIC ASSISTED VAGINAL HYSTERECTOMY;  Surgeon: Freddy Finner;  Location: [facility] ORS;  Service: Gynecology;  Laterality: N/A;  . Salpingoophorectomy  06/21/2011    Procedure:  SALPINGO OOPHERECTOMY;  Surgeon: Freddy Finner;  Location: [facility];  Service: Gynecology;  Laterality: Bilateral;  . Anterior and posterior repair  06/21/2011    Procedure: ANTERIOR (CYSTOCELE) AND POSTERIOR REPAIR (RECTOCELE);  Surgeon: Freddy Finner;  Location: [facility];  Service: Gynecology;  Laterality: N/A;  . Vaginal prolapse repair  06/21/2011    Procedure: SACROPEXY;  Surgeon: Freddy Finner;  Location: [facility];  Service: Gynecology;  Laterality: N/A;  sacrospinous ligament suspension  . Cardiac catheterization    . Aortic valve replacement N/A 07/17/2013    Procedure: AORTIC VALVE REPLACEMENT (AVR);  Surgeon: Kerin Perna, MD;  [facility];  Service: Open Heart Surgery;  Laterality: N/A;  . Coronary artery bypass graft N/A 07/17/2013    Procedure: CORONARY ARTERY BYPASS GRAFT, TIMES ONE, ON PUMP, USING RIGHT INTERNAL MAMMARY ARTERY.;  Surgeon: Kerin Perna, MD;  Location: [facility] OR;  Service: Open Heart Surgery;  Laterality: N/A;  . Intraoperative transesophageal echocardiogram N/A 07/17/2013    Procedure: INTRAOPERATIVE TRANSESOPHAGEAL ECHOCARDIOGRAM;  Surgeon: Kerin Perna, MD;  Location: [facility] OR;  Service: Open Heart Surgery;  Laterality: N/A;   Family History  Problem Relation Age of Onset  . Emphysema Father   . Colon cancer Neg Hx   . Esophageal cancer Neg Hx   . Rectal cancer Neg Hx   . Stomach cancer Neg Hx    Social History:  reports that she quit smoking about 40 years ago. Her smoking use included Cigarettes. She has a 30 pack-year smoking history. She has never used smokeless tobacco. She  reports that she does not drink alcohol or use illicit drugs. Allergies: No Known Allergies Medications Prior to Admission  Medication Sig Dispense Refill  . acetaminophen (TYLENOL) 325 MG tablet Take 650 mg by mouth as needed for pain.       [name] amLODipine (NORVASC) 5 MG tablet Take 5 mg by mouth daily.       [name] aspirin 325 MG tablet Take 650 mg by mouth as needed for pain.      [name]  aspirin 81 MG tablet Take 81 mg by mouth at bedtime.       . bimatoprost (LUMIGAN) 0.01 % SOLN Place 1 drop into both eyes at bedtime.       . Cholecalciferol (VITAMIN D-3 PO) Take 2,000 Units by mouth at bedtime.       . fluticasone (FLOVENT HFA) 44 MCG/ACT inhaler Inhale 2 puffs into the lungs daily.      . metFORMIN (GLUCOPHAGE-XR) 500 MG 24 hr tablet Take 1,000 mg by mouth daily after supper.       . Multiple Vitamins-Minerals (OCUVITE-LUTEIN PO) Take 1 tablet by mouth every morning.       . mupirocin ointment (BACTROBAN) 2 % Apply topically 2 (two) times daily. APPLY TO THE INSIDE OF BOTH NOSTRILS TWICE A DAY X 5 DAYS  22 g  0  . omeprazole (PRILOSEC) 20 MG capsule Take 20 mg by mouth at bedtime.       . pravastatin (PRAVACHOL) 40 MG tablet Take 40 mg by mouth daily.       . promethazine-codeine (PHENERGAN WITH CODEINE) 6.25-10 MG/5ML syrup Take 5 mLs by mouth every 4 (four) hours as needed for cough.      . sertraline (ZOLOFT) 50 MG tablet Take 50 mg by mouth every morning.       . trolamine salicylate (MYOFLEX) 10 % cream Apply 1 application topically as needed (for lower back pain).      . valsartan-hydrochlorothiazide (DIOVAN-HCT) 320-12.5 MG per tablet Take 1 tablet by mouth every morning.         Home: Home Living Family/patient expects to be discharged to:: Private residence Living Arrangements: Spouse/significant other Home Access: Stairs to enter Entrance Stairs-Number of Steps: 3 Additional Comments: pt unable to provide PLOF. per RN spouse reported pt is independent at home without use of DME. Spouse assists on steps into home only  Functional History:   Functional Status:  Mobility: Bed Mobility Bed Mobility: Rolling Right;Right Sidelying to Sit Rolling Right: 1: +1 Total assist Right Sidelying to Sit: 1: +1 Total assist;HOB elevated Transfers Transfers: Sit to Stand;Stand to Sit;Squat Pivot Transfers Sit to Stand: 1: +1 Total assist;From bed;From elevated  surface Stand to Sit: 1: +1 Total assist;To chair/3-in-1 Squat Pivot Transfers: 1: +1 Total assist;From elevated surface Ambulation/Gait Ambulation/Gait Assistance: Not tested (comment)    ADL:    Cognition: Cognition Overall Cognitive Status: Impaired/Different from baseline Orientation Level: Oriented to person;Oriented to place;Oriented to situation Cognition Arousal/Alertness: Awake/alert Behavior During Therapy: Flat affect Overall Cognitive Status: Impaired/Different from baseline Area of Impairment: Orientation;Attention;Memory;Following commands;Safety/judgement;Awareness;Problem solving Orientation Level: Time Current Attention Level: Focused Memory: Decreased recall of precautions Following Commands: Follows one step commands inconsistently;Follows one step commands with increased time Safety/Judgement: Decreased awareness of safety;Decreased awareness of deficits Problem Solving: Slow processing;Decreased initiation;Difficulty sequencing;Requires verbal cues;Requires tactile cues General Comments: Pt with decreased attention with relatively blank stare throughout but would track if instructed to do so. Pt multiple times just repeating question rather than answering it.  Pt unable to recall day of week, precautions or education and only follow commands grossly 20% of session  Blood pressure 149/61, pulse 72, temperature 98.2 F (36.8 C), temperature source Oral, resp. rate 19, height 5' (1.524 m), weight 59.6 kg (131 lb 6.3 oz), SpO2 92.00%. Physical Exam  Vitals reviewed. Constitutional:  Frail appearing.   HENT:  Head: Normocephalic.  Eyes: EOM are normal.  Neck: Normal range of motion. Neck supple. No thyromegaly present.  Cardiovascular:  Cardiac rate controlled  Respiratory:  Decreased breath sounds at the bases but clear to auscultation. Slightly dsypneic  GI: Soft. Bowel sounds are normal. She exhibits no distension.  Neurological: She is alert.  No CN  deficits. Generalized proximal greater than distal weakness. 3 -->>4/5. No sensory findings. dtr's 1+. Decreased truncal stability  Skin:  Midline chest incision clean and dry  Psychiatric: She has a normal mood and affect. Her behavior is normal.    Results for orders placed during the hospital encounter of 07/17/13 (from the past 24 hour(s))  GLUCOSE, CAPILLARY     Status: None   Collection Time    07/20/13  7:34 AM      Result Value Range   Glucose-Capillary 97  70 - 99 mg/dL  GLUCOSE, CAPILLARY     Status: Abnormal   Collection Time    07/20/13 12:15 PM      Result Value Range   Glucose-Capillary 143 (*) 70 - 99 mg/dL   Comment 1 Notify RN    GLUCOSE, CAPILLARY     Status: Abnormal   Collection Time    07/20/13  4:09 PM      Result Value Range   Glucose-Capillary 113 (*) 70 - 99 mg/dL   Comment 1 Notify RN    GLUCOSE, CAPILLARY     Status: Abnormal   Collection Time    07/20/13  7:21 PM      Result Value Range   Glucose-Capillary 162 (*) 70 - 99 mg/dL   Comment 1 Documented in Chart     Comment 2 Notify RN    GLUCOSE, CAPILLARY     Status: None   Collection Time    07/21/13 12:05 AM      Result Value Range   Glucose-Capillary 75  70 - 99 mg/dL   Comment 1 Documented in Chart     Comment 2 Notify RN    GLUCOSE, CAPILLARY     Status: None   Collection Time    07/21/13  3:33 AM      Result Value Range   Glucose-Capillary 73  70 - 99 mg/dL   Comment 1 Documented in Chart     Comment 2 Notify RN     Ct Head Wo Contrast  07/19/2013   CLINICAL DATA:  Altered mental status. Recent CABG. History of diabetes and hypertension as well as aortic stenosis.  EXAM: CT HEAD WITHOUT CONTRAST  TECHNIQUE: Contiguous axial images were obtained from the base of the skull through the vertex without contrast.  COMPARISON:  None.  FINDINGS: Mild to moderate age-related cerebral and cerebellar atrophy, consistent with the patient's age of 19. Chronic microvascular ischemic change affects the  periventricular and subcortical white matter. Moderate-sized lacunar infarct affects the right caudate and periventricular region. Scattered hypodensities bilaterally in the insular cortex, basal ganglia, and pons not clearly acute, also likely manifestations of chronic ischemia. No CT signs of proximal vascular thrombosis. Moderate vascular calcification affecting the carotid siphon regions. Calvarium intact. Clear sinuses and mastoids.  IMPRESSION: Chronic changes as described. No definite acute intracranial findings.   Electronically Signed   By: Davonna Belling M.D.   On: 07/19/2013 10:23   Dg Chest Port 1 View  07/20/2013   CLINICAL DATA:  Subsequent evaluation of left lower lobe atelectasis after CABG and aortic valve replacement on 07/17/2013.  EXAM: PORTABLE CHEST - 1 VIEW  COMPARISON:  Portable examinations yesterday dating back to 07/17/2013.  FINDINGS: Sternotomy for CABG and aortic valve replacement. Cardiac silhouette mildly enlarged for technique, unchanged. Pulmonary vascularity normal. Thoracic aorta atherosclerotic, unchanged. Hilar and mediastinal contours otherwise unremarkable. Dense atelectasis in the left lower lobe, unchanged, with associated left pleural effusion. Interval left chest tube removal with no visible pneumothorax. Right lung remains clear apart from minimal atelectasis at the base. No new pulmonary parenchymal abnormalities. Right jugular introducer sheath tip overlies the upper SVC, unchanged.  IMPRESSION: 1. No pneumothorax after left chest tube removal. 2. Stable dense left lower lobe atelectasis and probable left pleural effusion. Stable mild right basilar atelectasis. 3. No new abnormalities.   Electronically Signed   By: Hulan Saas M.D.   On: 07/20/2013 07:31    Assessment/Plan: Diagnosis: deconditioning d/t CAD/AS s/p AVR/CABG 1. Does the need for close, 24 hr/day medical supervision in concert with the patient's rehab needs make it unreasonable for this patient  to be served in a less intensive setting? Yes 2. Co-Morbidities requiring supervision/potential complications: COPD, htn 3. Due to bladder management, bowel management, safety, skin/wound care, disease management, medication administration, pain management and patient education, does the patient require 24 hr/day rehab nursing? Yes 4. Does the patient require coordinated care of a physician, rehab nurse, PT (1-2 hrs/day, 5 days/week) and OT (1-2 hrs/day, 5 days/week) to address physical and functional deficits in the context of the above medical diagnosis(es)? Yes Addressing deficits in the following areas: balance, endurance, locomotion, strength, transferring, bowel/bladder control, bathing, dressing, feeding, grooming, toileting and psychosocial support 5. Can the patient actively participate in an intensive therapy program of at least 3 hrs of therapy per day at least 5 days per week? Yes and Potentially 6. The potential for patient to make measurable gains while on inpatient rehab is good 7. Anticipated functional outcomes upon discharge from inpatient rehab are supervision to minimal assist with PT, min assist with OT, n/a with SLP. 8. Estimated rehab length of stay to reach the above functional goals is: 16-20 days 9. Does the patient have adequate social supports to accommodate these discharge functional goals? Yes 10. Anticipated D/C setting: Home 11. Anticipated post D/C treatments: HH therapy 12. Overall Rehab/Functional Prognosis: excellent  RECOMMENDATIONS: This patient's condition is appropriate for continued rehabilitative care in the following setting: CIR Patient has agreed to participate in recommended program. Yes Note that insurance prior authorization may be required for reimbursement for recommended care.  Comment: Pt's husband can provide support. Appears quite deconditioned. Would benefit from our therapy environment where she can be monitored closely from a Cardiac and  pulmonary standpoint before going home. Rehab RN to follow up.   Ranelle Oyster, MD, [place] [facility]     07/21/2013

## 2013-07-21 NOTE — Evaluation (Signed)
Occupational Therapy Evaluation Patient Details Name: [name] MRN: [id number] DOB: 11-27-1931 Today's Date: 07/21/2013 Time: 4098-1191 OT Time Calculation (min): 25 min  OT Assessment / Plan / Recommendation History of present illness [name]  has presented for surgery, with the diagnosis of Chest pain  s/p AVR, CABG. Pt with AMS postop with decreased awareness and ability to follow commands and well as generalized weakness   Clinical Impression   Pt admitted with above.  She demonstrates the below listed deficits and will benefit from OT to address deficits and allow her to maximize safety and independence with BADLs.  She currently requires mod - max A for BADLs.  Recommend CIR     OT Assessment  Patient needs continued OT Services    Follow Up Recommendations  CIR;Supervision/Assistance - 24 hour    Barriers to Discharge      Equipment Recommendations  None recommended by OT    Recommendations for Other Services    Frequency  Min 2X/week    Precautions / Restrictions Precautions Precautions: Sternal;Fall   Pertinent Vitals/Pain     ADL  Eating/Feeding: Supervision/safety Where Assessed - Eating/Feeding: Chair Grooming: Wash/dry hands;Wash/dry face;Brushing hair Where Assessed - Grooming: Supported sitting Upper Body Bathing: Minimal assistance Where Assessed - Upper Body Bathing: Supported sitting Lower Body Bathing: Maximal assistance Where Assessed - Lower Body Bathing: Supported sit to stand Upper Body Dressing: Moderate assistance Where Assessed - Upper Body Dressing: Unsupported sitting Lower Body Dressing: +1 Total assistance Where Assessed - Lower Body Dressing: Supported sit to Pharmacist, hospital: Moderate assistance Toilet Transfer Method: Stand pivot;Sit to Barista: Materials engineer and Hygiene: Maximal assistance Where Assessed - Engineer, mining and Hygiene:  Standing Transfers/Ambulation Related to ADLs: Pt requires mod A and max verbal cues for sequencing ADL Comments: Pt instructed in sternal precautions and how they impact BADLs    OT Diagnosis: Generalized weakness;Cognitive deficits  OT Problem List: Decreased strength;Impaired balance (sitting and/or standing);Decreased cognition;Decreased safety awareness;Decreased knowledge of use of DME or AE;Decreased knowledge of precautions OT Treatment Interventions: Self-care/ADL training;DME and/or AE instruction;Therapeutic activities;Patient/family education;Balance training   OT Goals(Current goals can be found in the care plan section) Acute Rehab OT Goals Patient Stated Goal: "to wipe my butt" OT Goal Formulation: With patient Time For Goal Achievement: 08/04/13 Potential to Achieve Goals: Good ADL Goals Pt Will Perform Grooming: with min assist;standing Pt Will Perform Upper Body Bathing: with supervision;sitting Pt Will Perform Lower Body Bathing: with min assist;sit to/from stand Pt Will Perform Upper Body Dressing: with min assist;sitting Pt Will Perform Lower Body Dressing: with min assist;sit to/from stand;with adaptive equipment Pt Will Transfer to Toilet: with min assist;ambulating;regular height toilet;grab bars Pt Will Perform Toileting - Clothing Manipulation and hygiene: with min assist;sit to/from stand  Visit Information  Last OT Received On: 07/21/13 Assistance Needed: +2 History of Present Illness: [name]  has presented for surgery, with the diagnosis of Chest pain  s/p AVR, CABG. Pt with AMS postop with decreased awareness and ability to follow commands and well as generalized weakness       Prior Functioning     Home Living Family/patient expects to be discharged to:: Private residence Living Arrangements: Spouse/significant other Available Help at Discharge: Family;Available 24 hours/day Type of Home: House Home Access: Stairs to enter [employer] of Steps: 3 Home Equipment: Walker - 2 wheels;Grab bars - toilet;Grab bars - tub/shower Additional Comments: Pt reports husband is  able to provide 24 hour assist at necessary Prior Function Level of Independence: Independent Comments: per pt report she was independent Communication Communication: No difficulties Dominant Hand: Right         Vision/Perception     Cognition  Cognition Arousal/Alertness: Awake/alert Behavior During Therapy: WFL for tasks assessed/performed Overall Cognitive Status: Impaired/Different from baseline Area of Impairment: Attention;Memory;Problem solving Current Attention Level: Selective Memory: Decreased recall of precautions Safety/Judgement: Decreased awareness of safety;Decreased awareness of deficits Problem Solving: Difficulty sequencing;Requires verbal cues;Requires tactile cues General Comments: Pt will self distract.  requires min verbal cues to problem solve through safe transfer    Extremity/Trunk Assessment Upper Extremity Assessment Upper Extremity Assessment: Overall WFL for tasks assessed Lower Extremity Assessment Lower Extremity Assessment: Defer to PT evaluation     Mobility Bed Mobility Bed Mobility: Rolling Left;Left Sidelying to Sit;Sitting - Scoot to Edge of Bed Rolling Left: 3: Mod assist Left Sidelying to Sit: 3: Mod assist Sitting - Scoot to Edge of Bed: 3: Mod assist Details for Bed Mobility Assistance: Pt requires step by step cues and assist to roll, and assist to lift shoulders from bed.  Assist to reciprocally scoot hips forward on bed Transfers Transfers: Sit to Stand;Stand to Sit Sit to Stand: 3: Mod assist;Without upper extremity assist;From bed Stand to Sit: 3: Mod assist;Without upper extremity assist;To chair/3-in-1 Details for Transfer Assistance: Pt with assist to move into standing.  Pt rocks back on heels while hyperextending knees.  difficulty advancing lt. LE during transfer.  pt with  scissoring noted     Exercise     Balance Balance Balance Assessed: Yes Static Sitting Balance Static Sitting - Balance Support: Feet supported Static Sitting - Level of Assistance: 4: Min assist Static Sitting - Comment/# of Minutes: min A progressing to min guard assist Static Standing Balance Static Standing - Balance Support: No upper extremity supported Static Standing - Level of Assistance: 3: Mod assist   End of Session OT - End of Session Activity Tolerance: Patient tolerated treatment well Patient left: in chair;with call bell/phone within reach Nurse Communication: Mobility status  GO     Olyn Landstrom M 07/21/2013, 4:44 PM

## 2013-07-21 NOTE — Progress Notes (Signed)
Rehab Admissions Coordinator Note:  Patient was screened by Trish Mage for appropriateness for an Inpatient Acute Rehab Consult.  At this time, an inpatient rehab consult is pending completion.  Will follow up after consult is completed.  Trish Mage 07/21/2013, 8:15 AM  I can be reached at [phone].

## 2013-07-21 NOTE — Progress Notes (Signed)
Patient ID: [name], female   DOB: 11-27-1931, 79 y.o.   MRN: [id number]   SICU Evening Rounds:   Hemodynamically stable   Urine output good    CBC    Component Value Date/Time   WBC 14.8* 07/21/2013 0900   RBC 3.56* 07/21/2013 0900   HGB 10.4* 07/21/2013 0900   HCT 31.6* 07/21/2013 0900   PLT 168 07/21/2013 0900   MCV 88.8 07/21/2013 0900   MCH 29.2 07/21/2013 0900   MCHC 32.9 07/21/2013 0900   RDW 14.7 07/21/2013 0900   LYMPHSABS 2.7 06/20/2013 1452   MONOABS 0.9 06/20/2013 1452   EOSABS 0.2 06/20/2013 1452   BASOSABS 0.0 06/20/2013 1452     BMET    Component Value Date/Time   NA 141 07/21/2013 0900   K 3.4* 07/21/2013 0900   CL 102 07/21/2013 0900   CO2 26 07/21/2013 0900   GLUCOSE 83 07/21/2013 0900   BUN 35* 07/21/2013 0900   CREATININE 1.16* 07/21/2013 0900   CALCIUM 9.1 07/21/2013 0900   GFRNONAA 44* 07/21/2013 0900   GFRAA 51* 07/21/2013 0900     A/P:  Stable postop course. Continue current plans

## 2013-07-21 NOTE — Progress Notes (Signed)
4 Days Post-Op Procedure(s) (LRB): AORTIC VALVE REPLACEMENT (AVR) (N/A) CORONARY ARTERY BYPASS GRAFT, TIMES ONE, ON PUMP, USING RIGHT INTERNAL MAMMARY ARTERY. (N/A) INTRAOPERATIVE TRANSESOPHAGEAL ECHOCARDIOGRAM (N/A) Subjective: Much more alert and stronger- swallowing safely NSR Breathing comfortably Incisions clean  Objective: Vital signs in last 24 hours: Temp:  [97.7 F (36.5 C)-98.2 F (36.8 C)] 98.1 F (36.7 C) (11/03 1208) Pulse Rate:  [65-122] 70 (11/03 1200) Cardiac Rhythm:  [-] Normal sinus rhythm (11/03 1200) Resp:  [11-23] 17 (11/03 1200) BP: (116-160)/(49-136) 150/64 mmHg (11/03 1200) SpO2:  [91 %-98 %] 98 % (11/03 1223) FiO2 (%):  [36 %] 36 % (11/02 1330) Weight:  [131 lb 6.3 oz (59.6 kg)] 131 lb 6.3 oz (59.6 kg) (11/03 0600)  Hemodynamic parameters for last 24 hours:  nsr afebrile  Intake/Output from previous day: 11/02 0701 - 11/03 0700 In: 80 [I.V.:80] Out: 2402 [Urine:2400; Stool:2] Intake/Output this shift: Total I/O In: -  Out: 75 [Urine:75]  EXAM Alert, appropriate Soft systolic flow murmur Lungs clear Patient too weak to walk Lab Results:  Recent Labs  07/20/13 0415 07/21/13 0900  WBC 14.7* 14.8*  HGB 8.5* 10.4*  HCT 26.0* 31.6*  PLT 111* 168   BMET:  Recent Labs  07/20/13 0415 07/21/13 0900  NA 135 141  K 4.2 3.4*  CL 102 102  CO2 22 26  GLUCOSE 109* 83  BUN 30* 35*  CREATININE 1.30* 1.16*  CALCIUM 8.4 9.1    PT/INR: No results found for this basename: LABPROT, INR,  in the last 72 hours ABG    Component Value Date/Time   PHART 7.322* 07/19/2013 0504   HCO3 21.0 07/19/2013 0504   TCO2 22 07/19/2013 0504   ACIDBASEDEF 5.0* 07/19/2013 0504   O2SAT 96.0 07/19/2013 0504   CBG (last 3)   Recent Labs  07/21/13 0801 07/21/13 0946 07/21/13 1206  GLUCAP 77 162* 137*    Assessment/Plan: S/P Procedure(s) (LRB): AORTIC VALVE REPLACEMENT (AVR) (N/A) CORONARY ARTERY BYPASS GRAFT, TIMES ONE, ON PUMP, USING RIGHT INTERNAL  MAMMARY ARTERY. (N/A) INTRAOPERATIVE TRANSESOPHAGEAL ECHOCARDIOGRAM (N/A) Cont PT Transfer to step down when able to walk   LOS: 4 days    [name],[name] 07/21/2013

## 2013-07-21 NOTE — Progress Notes (Signed)
Speech Language Pathology Treatment: Dysphagia  Patient Details Name: [name] MRN: [id number] DOB: 03-23-1932 Today's Date: 07/21/2013 Time: 1710-1741 SLP Time Calculation (min): 31 min  Assessment / Plan / Recommendation Clinical Impression  Pt.'s liquids were advanced today by Dr. Donata Clay for lunch meal.  RN reports pt. Appears to be tolerating thins without overt s/s of aspiration.  SLP reassessed for diet advancement (still on Dysphagia 1).  Pt. Is more alert and oriented.  Sitting up in chair conversing with MD and her husband.  Pt. Continues to have a wet voice quality when drinking thin liquids with a straw, but seems to tolerate thins when taking small sips via cup.  Pt. Chewed small bites of a cracker and swallowed without difficulty.  Swallow appears to be timely, and good laryngeal elevation was palpated.   HPI HPI: 79 y/o female admitted for aortic valve replacement and CABG x1.     Pertinent Vitals Afebrile; lung sounds diminished.   SLP Plan  Goals updated    Recommendations Diet recommendations: Dysphagia 3 (mechanical soft);Thin liquid (Chopped meats) Liquids provided via: Cup Medication Administration: Whole meds with puree Supervision: Intermittent supervision to cue for compensatory strategies;Patient able to self feed Compensations: Slow rate;Small sips/bites Postural Changes and/or Swallow Maneuvers: Seated upright 90 degrees;Upright 30-60 min after meal              Oral Care Recommendations: Oral care BID Follow up Recommendations: Inpatient Rehab Plan: Goals updated    GO     [name] T 07/21/2013, 5:41 PM

## 2013-07-22 ENCOUNTER — Inpatient Hospital Stay (HOSPITAL_COMMUNITY): Payer: Medicare Other

## 2013-07-22 LAB — GLUCOSE, CAPILLARY
Glucose-Capillary: 40 mg/dL — CL (ref 70–99)
Glucose-Capillary: 61 mg/dL — ABNORMAL LOW (ref 70–99)
Glucose-Capillary: 174 mg/dL — ABNORMAL HIGH (ref 70–99)
Glucose-Capillary: 158 mg/dL — ABNORMAL HIGH (ref 70–99)
Glucose-Capillary: 98 mg/dL (ref 70–99)
Glucose-Capillary: 181 mg/dL — ABNORMAL HIGH (ref 70–99)
Glucose-Capillary: 78 mg/dL (ref 70–99)
Glucose-Capillary: 147 mg/dL — ABNORMAL HIGH (ref 70–99)

## 2013-07-22 LAB — CBC
HCT: 29.7 % — ABNORMAL LOW (ref 36.0–46.0)
Hemoglobin: 9.7 g/dL — ABNORMAL LOW (ref 12.0–15.0)
MCH: 29.1 pg (ref 26.0–34.0)
MCHC: 32.7 g/dL (ref 30.0–36.0)
MCV: 89.2 fL (ref 78.0–100.0)
Platelets: 171 10*3/uL (ref 150–400)
RBC: 3.33 MIL/uL — ABNORMAL LOW (ref 3.87–5.11)
RDW: 14.5 % (ref 11.5–15.5)
WBC: 11.1 10*3/uL — ABNORMAL HIGH (ref 4.0–10.5)

## 2013-07-22 LAB — BASIC METABOLIC PANEL
BUN: 37 mg/dL — ABNORMAL HIGH (ref 6–23)
CO2: 28 mEq/L (ref 19–32)
Calcium: 8.8 mg/dL (ref 8.4–10.5)
Chloride: 102 mEq/L (ref 96–112)
Creatinine, Ser: 1.13 mg/dL — ABNORMAL HIGH (ref 0.50–1.10)
GFR calc Af Amer: 53 mL/min — ABNORMAL LOW (ref >90)
GFR calc non Af Amer: 45 mL/min — ABNORMAL LOW (ref >90)
Glucose, Bld: 105 mg/dL — ABNORMAL HIGH (ref 70–99)
Potassium: 3.8 mEq/L (ref 3.5–5.1)
Sodium: 140 mEq/L (ref 135–145)

## 2013-07-22 MED ORDER — DEXTROSE 50 % IV SOLN
25.0000 mL | Freq: Once | INTRAVENOUS | Status: AC | PRN
  Administered 2013-07-22: 25 mL via INTRAVENOUS

## 2013-07-22 MED ORDER — MOVING RIGHT ALONG BOOK
Freq: Once | Status: AC
  Administered 2013-07-22: 1
  Filled 2013-07-22: qty 1

## 2013-07-22 MED ORDER — ALUM & MAG HYDROXIDE-SIMETH 200-200-20 MG/5ML PO SUSP: 15.0000 mL | ORAL | Status: DC | PRN

## 2013-07-22 MED ORDER — FUROSEMIDE 20 MG PO TABS
20.0000 mg | ORAL_TABLET | Freq: Every day | ORAL | Status: DC
  Administered 2013-07-22 – 2013-07-24 (×2): 20 mg via ORAL
  Filled 2013-07-22 (×3): qty 1

## 2013-07-22 MED ORDER — DEXTROSE 50 % IV SOLN
INTRAVENOUS | Status: AC
  Filled 2013-07-22: qty 50

## 2013-07-22 MED ORDER — SODIUM CHLORIDE 0.9 % IJ SOLN: 3.0000 mL | INTRAMUSCULAR | Status: DC | PRN

## 2013-07-22 MED ORDER — INSULIN ASPART 100 UNIT/ML ~~LOC~~ SOLN
0.0000 [IU] | Freq: Three times a day (TID) | SUBCUTANEOUS | Status: DC
  Administered 2013-07-22: 4 [IU] via SUBCUTANEOUS
  Administered 2013-07-23: 2 [IU] via SUBCUTANEOUS
  Administered 2013-07-23 (×2): 4 [IU] via SUBCUTANEOUS
  Administered 2013-07-24: 2 [IU] via SUBCUTANEOUS

## 2013-07-22 MED ORDER — SODIUM CHLORIDE 0.9 % IJ SOLN
3.0000 mL | Freq: Two times a day (BID) | INTRAMUSCULAR | Status: DC
  Administered 2013-07-22 – 2013-07-24 (×3): 3 mL via INTRAVENOUS

## 2013-07-22 MED ORDER — SODIUM CHLORIDE 0.9 % IV SOLN: 250.0000 mL | INTRAVENOUS | Status: DC | PRN

## 2013-07-22 MED ORDER — MAGNESIUM HYDROXIDE 400 MG/5ML PO SUSP: 30.0000 mL | Freq: Every day | ORAL | Status: DC | PRN

## 2013-07-22 MED ORDER — ENOXAPARIN SODIUM 30 MG/0.3ML ~~LOC~~ SOLN
30.0000 mg | SUBCUTANEOUS | Status: DC
  Administered 2013-07-22 – 2013-07-24 (×2): 30 mg via SUBCUTANEOUS
  Filled 2013-07-22 (×5): qty 0.3

## 2013-07-22 NOTE — Progress Notes (Signed)
Hypoglycemic Event  CBG: 61   Treatment: D50 IV 25 mL  Symptoms: Pale and Sweaty  Follow-up CBG: Time:1225 CBG Result:174  Possible Reasons for Event: Unknown  Comments/MD notified:none    [name] R  Remember to initiate Hypoglycemia Order Set & complete

## 2013-07-22 NOTE — Progress Notes (Signed)
Physical Therapy Treatment Patient Details Name: [name] MRN: [id number] DOB: 1932/07/16 Today's Date: 07/22/2013 Time: 4098-1191 PT Time Calculation (min): 15 min  PT Assessment / Plan / Recommendation  History of Present Illness [name]  has presented for surgery, with the diagnosis of Chest pain  s/p AVR, CABG. Pt with AMS postop with decreased awareness and ability to follow commands and well as generalized weakness   PT Comments   Pt is progressing well with her mobility and was able to walk into the hallway today with moderate assistance and bil upper extremities supported.  She did not go far, but this is a big improvement over her last session.  O2 sats on O2 [place] were stable with gait.  PT continues to recommend CIR level therapies at discharge.   Follow Up Recommendations  CIR     Does the patient have the potential to tolerate intense rehabilitation    Yes  Barriers to Discharge   None      Equipment Recommendations  Rolling walker with 5" wheels    Recommendations for Other Services   None  Frequency Min 3X/week   Progress towards PT Goals Progress towards PT goals: Progressing toward goals  Plan Current plan remains appropriate    Precautions / Restrictions Precautions Precautions: Sternal;Fall Restrictions Weight Bearing Restrictions:  (sternal precautions)   Pertinent Vitals/Pain O2 sats and HR stable with mobility   Mobility  Bed Mobility Bed Mobility: Rolling Left;Left Sidelying to Sit;Sitting - Scoot to [place] of Bed Rolling Left: 4: Min assist Left Sidelying to Sit: 4: Min assist Sitting - Scoot to Edge of Bed: 4: Min assist Details for Bed Mobility Assistance: max verbal cues for decreased use of her upper extremities for sternal precautions.  Transfers Transfers: Sit to Stand;Stand to Sit Sit to Stand: 4: Min assist;Not tested (comment);From bed Stand to Sit: 4: Min assist;To chair/3-in-1 Details for Transfer Assistance: min assist to  support trunk for balance.  Ambulation/Gait Ambulation/Gait Assistance: 3: Mod assist Ambulation Distance (Feet): 70 Feet Assistive device: Other (Comment) (WC to hold lines and monitor) Ambulation/Gait Assistance Details: mod assist to steady pt's trunk for balance during gait. She is easily distracted in the hallway and this increases her LOB.   Gait Pattern: Step-through pattern;Shuffle;Lateral trunk lean to right Gait velocity: decreased General Gait Details: O2 sats remained stable on O2 [place]     PT Goals (current goals can now be found in the care plan section) Acute Rehab PT Goals Patient Stated Goal: pt reports she wants to be independent again.   Visit Information  Last PT Received On: 07/22/13 Assistance Needed: +2 (for line management) History of Present Illness: [name]  has presented for surgery, with the diagnosis of Chest pain  s/p AVR, CABG. Pt with AMS postop with decreased awareness and ability to follow commands and well as generalized weakness    Subjective Data  Subjective: Pt reports that she is much more alert and oriented now.   Patient Stated Goal: pt reports she wants to be independent again.    Cognition  Cognition Arousal/Alertness: Awake/alert Behavior During Therapy: WFL for tasks assessed/performed Overall Cognitive Status: Impaired/Different from baseline Area of Impairment: Attention;Memory;Problem solving Current Attention Level: Selective Memory: Decreased recall of precautions Safety/Judgement: Decreased awareness of safety;Decreased awareness of deficits Problem Solving: Difficulty sequencing;Requires verbal cues;Requires tactile cues General Comments: Pt is very distractable within herself and in a busy hallway environment and when she gets distracted and is on her feet  she loses her balance more.         End of Session PT - End of Session Equipment Utilized During Treatment: Gait belt;Oxygen Activity Tolerance: Patient limited by  fatigue Patient left: in chair;with call bell/phone within reach Nurse Communication: Mobility status     Lurena Joiner B. Derion Kreiter, PT, DPT [phone]   07/22/2013, 5:40 PM

## 2013-07-22 NOTE — Progress Notes (Signed)
5 Days Post-Op Procedure(s) (LRB): AORTIC VALVE REPLACEMENT (AVR) (N/A) CORONARY ARTERY BYPASS GRAFT, TIMES ONE, ON PUMP, USING RIGHT INTERNAL MAMMARY ARTERY. (N/A) INTRAOPERATIVE TRANSESOPHAGEAL ECHOCARDIOGRAM (N/A) Subjective: Mental status normal Breathing comfortablr NSR CXR with min L pleural effusion Objective: Vital signs in last 24 hours: Temp:  [97.1 F (36.2 C)-98.8 F (37.1 C)] 97.6 F (36.4 C) (11/04 0750) Pulse Rate:  [51-122] 98 (11/04 0944) Cardiac Rhythm:  [-] Normal sinus rhythm (11/03 2000) Resp:  [11-33] 15 (11/04 0700) BP: (95-182)/(36-93) 150/93 mmHg (11/04 0700) SpO2:  [91 %-100 %] 98 % (11/04 0809) Weight:  [130 lb 8 oz (59.194 kg)] 130 lb 8 oz (59.194 kg) (11/04 0500)  Hemodynamic parameters for last 24 hours:  stable  Intake/Output from previous day: 11/03 0701 - 11/04 0700 In: -  Out: 1350 [Urine:1350] Intake/Output this shift: Total I/O In: 320 [P.O.:320] Out: 200 [Urine:200]  Exam Lungs clear Soft syst flow murmur thru AVR  Lab Results:  Recent Labs  07/21/13 0900 07/22/13 0715  WBC 14.8* 11.1*  HGB 10.4* 9.7*  HCT 31.6* 29.7*  PLT 168 171   BMET:  Recent Labs  07/21/13 0900 07/22/13 0715  NA 141 140  K 3.4* 3.8  CL 102 102  CO2 26 28  GLUCOSE 83 105*  BUN 35* 37*  CREATININE 1.16* 1.13*  CALCIUM 9.1 8.8    PT/INR: No results found for this basename: LABPROT, INR,  in the last 72 hours ABG    Component Value Date/Time   PHART 7.322* 07/19/2013 0504   HCO3 21.0 07/19/2013 0504   TCO2 22 07/19/2013 0504   ACIDBASEDEF 5.0* 07/19/2013 0504   O2SAT 96.0 07/19/2013 0504   CBG (last 3)   Recent Labs  07/22/13 0043 07/22/13 0404 07/22/13 0748  GLUCAP 174* 158* 98    Assessment/Plan: S/P Procedure(s) (LRB): AORTIC VALVE REPLACEMENT (AVR) (N/A) CORONARY ARTERY BYPASS GRAFT, TIMES ONE, ON PUMP, USING RIGHT INTERNAL MAMMARY ARTERY. (N/A) INTRAOPERATIVE TRANSESOPHAGEAL ECHOCARDIOGRAM (N/A) Plan for transfer to  step-down: see transfer orders Will need CIR later in week, 1-2 days DC EPWs tomorrow   LOS: 5 days    [name],[name] 07/22/2013

## 2013-07-22 NOTE — Progress Notes (Signed)
Hypoglycemic Event  CBG: 40   Treatment: 15 GM carbohydrate snack  Symptoms: Pale, Sweaty and Shaky  Follow-up CBG: Time:0010  CBG Result:61  Possible Reasons for Event: unknown  Comments/MD notified:none    [name] R  Remember to initiate Hypoglycemia Order Set & complete

## 2013-07-22 NOTE — Progress Notes (Signed)
Chaplain offered emotional/spiritual support to pt. Pt said she has daily contact or visits with family and regular contact with her minister. Pt said she "has lots of people praying for me." Pt said she is "not worried or anxious" and is focused on "getting on her feet and walking." Chaplain provided emotional and spiritual support, empathic listening, and presence. Pt was grateful for chaplain's support.   Maurene Capes [phone]

## 2013-07-22 NOTE — Progress Notes (Signed)
I met with pt at bedside. I will begin insurance approval for a possible admission to inpt rehab if approved by her insurance. She is in agreement. [phone]

## 2013-07-23 ENCOUNTER — Inpatient Hospital Stay (HOSPITAL_COMMUNITY): Payer: Medicare Other

## 2013-07-23 DIAGNOSIS — Z952 Presence of prosthetic heart valve: Secondary | ICD-10-CM

## 2013-07-23 DIAGNOSIS — Z951 Presence of aortocoronary bypass graft: Secondary | ICD-10-CM

## 2013-07-23 LAB — BASIC METABOLIC PANEL
BUN: 38 mg/dL — ABNORMAL HIGH (ref 6–23)
CO2: 26 mEq/L (ref 19–32)
Calcium: 8.6 mg/dL (ref 8.4–10.5)
Chloride: 106 mEq/L (ref 96–112)
Creatinine, Ser: 1.14 mg/dL — ABNORMAL HIGH (ref 0.50–1.10)
GFR calc Af Amer: 52 mL/min — ABNORMAL LOW (ref >90)
GFR calc non Af Amer: 45 mL/min — ABNORMAL LOW (ref >90)
Glucose, Bld: 133 mg/dL — ABNORMAL HIGH (ref 70–99)
Potassium: 4 mEq/L (ref 3.5–5.1)
Sodium: 142 mEq/L (ref 135–145)

## 2013-07-23 LAB — GLUCOSE, CAPILLARY
Glucose-Capillary: 133 mg/dL — ABNORMAL HIGH (ref 70–99)
Glucose-Capillary: 115 mg/dL — ABNORMAL HIGH (ref 70–99)
Glucose-Capillary: 170 mg/dL — ABNORMAL HIGH (ref 70–99)
Glucose-Capillary: 197 mg/dL — ABNORMAL HIGH (ref 70–99)

## 2013-07-23 LAB — CBC
HCT: 28 % — ABNORMAL LOW (ref 36.0–46.0)
Hemoglobin: 9.1 g/dL — ABNORMAL LOW (ref 12.0–15.0)
MCH: 29.3 pg (ref 26.0–34.0)
MCHC: 32.5 g/dL (ref 30.0–36.0)
MCV: 90 fL (ref 78.0–100.0)
Platelets: 173 10*3/uL (ref 150–400)
RBC: 3.11 MIL/uL — ABNORMAL LOW (ref 3.87–5.11)
RDW: 14.7 % (ref 11.5–15.5)
WBC: 7.5 10*3/uL (ref 4.0–10.5)

## 2013-07-23 MED ORDER — METOPROLOL TARTRATE 25 MG PO TABS
25.0000 mg | ORAL_TABLET | Freq: Two times a day (BID) | ORAL | Status: DC
  Administered 2013-07-23 – 2013-07-24 (×3): 25 mg via ORAL
  Filled 2013-07-23 (×4): qty 1

## 2013-07-23 MED ORDER — METHYLPREDNISOLONE SODIUM SUCC 40 MG IJ SOLR
40.0000 mg | Freq: Once | INTRAMUSCULAR | Status: AC
  Administered 2013-07-23: 40 mg via INTRAVENOUS
  Filled 2013-07-23: qty 1

## 2013-07-23 MED ORDER — METHYLPREDNISOLONE SODIUM SUCC 125 MG IJ SOLR
40.0000 mg | Freq: Once | INTRAMUSCULAR | Status: AC
  Filled 2013-07-23: qty 0.64

## 2013-07-23 MED ORDER — IPRATROPIUM-ALBUTEROL 20-100 MCG/ACT IN AERS
2.0000 | INHALATION_SPRAY | Freq: Four times a day (QID) | RESPIRATORY_TRACT | Status: DC
  Administered 2013-07-23 – 2013-07-24 (×6): 2 via RESPIRATORY_TRACT
  Filled 2013-07-23: qty 4

## 2013-07-23 MED ORDER — FUROSEMIDE 10 MG/ML IJ SOLN
20.0000 mg | Freq: Once | INTRAMUSCULAR | Status: AC
Start: 2013-07-23 — End: 2013-07-23
  Administered 2013-07-23: 20 mg via INTRAVENOUS
  Filled 2013-07-23: qty 2

## 2013-07-23 NOTE — Progress Notes (Signed)
I have [employer] to approve pt to admit to inpt rehab on Thursday. I will see pt in the morning to arrange. [phone]

## 2013-07-23 NOTE — Progress Notes (Signed)
Removed EPW per order. Ends were intact, no bleeding noted. No arrhythmias overnight. Pt on bed rest for one hour with frequent vitals set up.

## 2013-07-23 NOTE — Progress Notes (Signed)
Patient does not feel up to ambulating this evening.  She states she will try to walk more times tomorrow.  Will continue to monitor and encourage.  [name]

## 2013-07-23 NOTE — Progress Notes (Addendum)
      [address]       [place] [address]             [phone]      6 Days Post-Op Procedure(s) (LRB): AORTIC VALVE REPLACEMENT (AVR) (N/A) CORONARY ARTERY BYPASS GRAFT, TIMES ONE, ON PUMP, USING RIGHT INTERNAL MAMMARY ARTERY. (N/A) INTRAOPERATIVE TRANSESOPHAGEAL ECHOCARDIOGRAM (N/A)  Subjective:  Mrs. Montroy states she is doing much better this morning.  She states she does not feel like she has to breath as hard and is no longer wheezing + BM  Objective: Vital signs in last 24 hours: Temp:  [97.6 F (36.4 C)-99 F (37.2 C)] 98.3 F (36.8 C) (11/05 0552) Pulse Rate:  [58-102] 82 (11/05 0552) Cardiac Rhythm:  [-] Normal sinus rhythm (11/04 1945) Resp:  [17-20] 20 (11/05 0552) BP: (87-172)/(49-96) 150/79 mmHg (11/05 0701) SpO2:  [90 %-99 %] 92 % (11/05 0552) Weight:  [129 lb 3 oz (58.6 kg)] 129 lb 3 oz (58.6 kg) (11/05 0552)  Intake/Output from previous day: 11/04 0701 - 11/05 0700 In: 320 [P.O.:320] Out: 400 [Urine:400]  General appearance: alert, cooperative and no distress Heart: regular rate and rhythm Lungs: diminished breath sounds LLL Abdomen: soft, non-tender; bowel sounds normal; no masses,  no organomegaly Extremities: edema trace Wound: clean and dry  Lab Results:  Recent Labs  07/22/13 0715 07/23/13 0415  WBC 11.1* 7.5  HGB 9.7* 9.1*  HCT 29.7* 28.0*  PLT 171 173   BMET:  Recent Labs  07/22/13 0715 07/23/13 0415  NA 140 142  K 3.8 4.0  CL 102 106  CO2 28 26  GLUCOSE 105* 133*  BUN 37* 38*  CREATININE 1.13* 1.14*  CALCIUM 8.8 8.6    PT/INR: No results found for this basename: LABPROT, INR,  in the last 72 hours ABG    Component Value Date/Time   PHART 7.322* 07/19/2013 0504   HCO3 21.0 07/19/2013 0504   TCO2 22 07/19/2013 0504   ACIDBASEDEF 5.0* 07/19/2013 0504   O2SAT 96.0 07/19/2013 0504   CBG (last 3)   Recent Labs  07/22/13 1617 07/22/13 2101 07/23/13 0555  GLUCAP 78 147* 133*     Assessment/Plan: S/P Procedure(s) (LRB): AORTIC VALVE REPLACEMENT (AVR) (N/A) CORONARY ARTERY BYPASS GRAFT, TIMES ONE, ON PUMP, USING RIGHT INTERNAL MAMMARY ARTERY. (N/A) INTRAOPERATIVE TRANSESOPHAGEAL ECHOCARDIOGRAM (N/A)  1. CV- NSR, hypertensive today SBP 150s-170s- on Norvasc, Avapro, will increase Beta Blocker to 25 mg BID 2. Pulm- small left pleural effusion, continue IS, diuresis, wean oxygen as tolerated 3. Renal- mildly elevated, volume status is at baseline, however continue diuresis for now 4. DM- CBGs controlled, will restart Metformin at discharge 5. Deconditioning- needs CIR 6. Dispo- d/c EPW, patient progressing, CIR in 24-48 hours   LOS: 6 days    Raford Pitcher, Rey Dansby 07/23/2013

## 2013-07-23 NOTE — Progress Notes (Signed)
SLP Cancellation Note  Patient Details Name: [name] MRN: [id number] DOB: [birth year]   Cancelled treatment:       Reason Eval/Treat Not Completed: Medical issues which prohibited therapy. Pt breathing hard with expiratory wheeze, SpO2 in the mid 90s. MD, RN and RT present and aware. Pt's respiratory status began to return to baseline, however SLP provided education regarding the increased risk of aspiration with high RR and encouraged pt to wait until her breathing settled down to consume PO. Will return this morning as schedule permits to assess diet tolerance.   Maxcine Ham 07/23/2013, 9:15 AM  Maxcine Ham, M.A. CCC-SLP [phone]

## 2013-07-23 NOTE — Progress Notes (Addendum)
Pt ambulated in hallway approx 48ft. With wc, o2 at 2lnc. Pt became sob and weak. Walked pt back to room. sats 91% 2lnc. Monitoring will continue.

## 2013-07-23 NOTE — Progress Notes (Signed)
Speech Language Pathology Treatment: Dysphagia  Patient Details Name: [name] MRN: [id number] DOB: 1932/05/17 Today's Date: 07/23/2013 Time: 9147-8295 SLP Time Calculation (min): 15 min  Assessment / Plan / Recommendation Clinical Impression  Pt seen for f/u dysphagia treatment. SLP facilitated session with repositioning of pt for safe swallowing. Pt maximally alert today, and consumed trials of Dys 3 textures and thin liquids via cup and straw sips. Pt also observed taking medications both with puree and with thin liquid. Across trials, pt demonstrated multiple swallows but no overt s/s of aspiration. Pt appears able to initiate straw sips of thin liquids as tolerated. Will continue to recommend meds whole in puree per pt request. Will continue to follow to assess tolerance of advance to straw sips as well as readiness for solid advancement.   HPI HPI: 79 y/o female admitted for aortic valve replacement and CABG x1.     Pertinent Vitals N/A  SLP Plan  Continue with current plan of care    Recommendations Diet recommendations: Dysphagia 3 (mechanical soft);Thin liquid (chopped meats) Liquids provided via: Cup;Straw;No straw Medication Administration: Whole meds with puree Supervision: Intermittent supervision to cue for compensatory strategies;Patient able to self feed Compensations: Slow rate;Small sips/bites Postural Changes and/or Swallow Maneuvers: Seated upright 90 degrees;Upright 30-60 min after meal              General recommendations: Rehab consult Oral Care Recommendations: Oral care BID Follow up Recommendations: Inpatient Rehab Plan: Continue with current plan of care    GO     Maxcine Ham 07/23/2013, 10:00 AM  Maxcine Ham, M.A. CCC-SLP [phone]

## 2013-07-23 NOTE — Progress Notes (Signed)
CARDIAC REHAB PHASE I   PRE:  Rate/Rhythm: 78SR  BP:  Supine: 156/73  Sitting:   Standing:    SaO2: 95%2L, 85%RA  MODE:  Ambulation: 75 ft   POST:  Rate/Rhythm: 92SR  BP:  Supine: 156/75  Sitting:   Standing:    SaO2: 89-90%2L 1130-1214 Got pt's RN to help with walking pt. Ambulated 75 ft on 2L with rolling walker, gait belt use and asst x 2. Unsteady gait. Needs help steering walker, to stay close to walker, and not going toward wall. Encouraged to stand upright. Tired by end of walk. To bed for IV restart. Left on 2L . Set up lunch for pt. To BSC prior to walk.   Luetta Nutting, RN BSN  07/23/2013 12:08 PM

## 2013-07-23 NOTE — Discharge Summary (Signed)
Physician Discharge Summary  Patient ID: [name] MRN: [id number] DOB/AGE: May 25, 1932 79 y.o.  Admit date: 07/17/2013 Discharge date: 07/23/2013  Admission Diagnoses:  Patient Active Problem List   Diagnosis Date Noted  . Aortic stenosis 06/10/2013  . Dyspnea 06/10/2013  . Essential hypertension 06/10/2013  . Hyperlipidemia 06/10/2013  . COPD (chronic obstructive pulmonary disease) 10/26/2011   Discharge Diagnoses:   Patient Active Problem List   Diagnosis Date Noted  . S/P AVR (aortic valve replacement) 07/23/2013  . S/P CABG x 1 07/23/2013  . Aortic stenosis 06/10/2013  . Dyspnea 06/10/2013  . Essential hypertension 06/10/2013  . Hyperlipidemia 06/10/2013  . COPD (chronic obstructive pulmonary disease) 10/26/2011   Discharged Condition: good  History of Present Illness:   [name] is a  79 year old Caucasian female with known history of Aortic Stenosis followed for several years by Dr. Jens Som. Previously she has been asymptomatic, however more recently her symptoms have progressed to dyspnea on exertion.  On the last echocardiogram she was noted to have significant increase in gradient to Severe Aortic Stenosis.  Mean gradient was 48 mmHg with peak gradient 76 mmHg. LV systolic function is normal. No significant MR or TR. Due to disease progression and development of symptoms it was felt CT surgical evaluation was warranted.  She was evaluated by Dr. Donata Clay on 07/01/2013 at which time it was felt she would benefit from Aortic Valve Replacement.  However, there was concerned that her Aorta was heavily calcified and she would require further workup with CTA chest and cardiac catheterization prior to procedure.  This revealed mild calcification of the Aorta and catheterization revealed single vessel CAD.  The risks and benefits of Aortic Valve Replacement and CABG x 1 were explained to the patient and she was agreeable to proceed.    Hospital Course:   The patient  presented to [facility] on 07/17/2013. He was taken to the operating room and underwent CABG x 1 utilizing LIMA to LAD and Aortic Valve Replacement utilizing a 19 mm Magna Ease Bioprosthetic valve.  She tolerated the procedure well and was taken to the SICU in the stable condition.  The patient was extubated the evening of surgery.  During her stay in the ICU the patient had episodic confusion. This prompted CT scan of the head which was within normal limits.  She was weaned off all cardiac drips.  She had some initial dysphagia and was evaluated by Speech Therapy.  This has improved and dysphagia has resolved.  The patient had been slow and weak to ambulate.  She was evaluated by PT who felt she would benefit from inpatient rehab.  She developed hypertension and was restarted on her home medications.  The patient continued to progress and was transferred to the step down unit once she was able to ambulate with assist.  The patient continues to do well.  She is maintaining NSR and her pacing wires have been removed without difficulty.  She continues to ambulate with assistance.  She is tolerating a carb modified diet.  She remains on oxygen with a small left pleural effusion that is not large enough for intervention.  She will remain on diuresis and we will wean oxygen as tolerated.  She is otherwise medically stable.  We will plan to discharge to inpatient rehab in the next 24-48 hours pending no further issues arise.  Patient would benefit from a BMET once a week.  She would also benefit from a PA/LAT CXR Monday to  ensure pleural effusion is not enlarging.  She will follow up with Dr. Donata Clay in 3 weeks with a CXR prior to her appointment.  She will also need to follow up with Dr. Jens Som in 2-4 weeks.   Significant Diagnostic Studies:   ECHO - Left ventricle: The cavity size was normal. Wall thickness was increased in a pattern of moderate LVH. Systolic function was vigorous. The estimated  ejection fraction was in the range of 65% to 70%. Wall motion was normal; there were no regional wall motion abnormalities. Doppler parameters are consistent with abnormal left ventricular relaxation (grade 1 diastolic dysfunction). - Aortic valve: There was severe stenosis. - Atrial septum: No defect or patent foramen ovale was Identified.  CATH:  RA: 11/7  RV: 39/4  PCWP: 5/5/4  PA: 31/13 (23)  Cardiac Output  Thermodilution: 3.77 Index of 2.45  Fick : 3.19 Index 2.07  Arterial Sat: 97%  PA Sat: 61%.  LV pressure: AV was not crossed ( known severe AS)  Aortic pressure: 117/54  Angiography  Left Main: large, smooth and normal  Left anterior Descending: Large vessel, There is a long 70 % mid stenosis, diags are small and have moderate disease  Left Circumflex: large, Minor luminal irreg. OM1, OM2 have minor luminal irreg.  Right Coronary Artery: Moderate in size, mild irreg in the mid segment - possibly as tight as 40-50%. PDA and PLSA are moderate in size and are normal.  LV Gram: not done  Treatments: surgery:    1. Coronary artery bypass grafting x1 (left internal mammary artery to  LAD).  2. Aortic valve replacement for severe aortic stenosis with a 19 mm  Edwards pericardial valve (serial [id number], Magna Ease model  [id number]).  Disposition: Inpatient rehab  Discharge Medications:     Medication List    STOP taking these medications       aspirin 325 MG tablet     aspirin 81 MG tablet  Replaced by:  aspirin 325 MG EC tablet     mupirocin ointment 2 %  Commonly known as:  BACTROBAN     valsartan-hydrochlorothiazide 320-12.5 MG per tablet  Commonly known as:  DIOVAN-HCT      TAKE these medications       amLODipine 10 MG tablet  Commonly known as:  NORVASC  Take 1 tablet (10 mg total) by mouth daily.     aspirin 325 MG EC tablet  Take 1 tablet (325 mg total) by mouth daily.     bimatoprost 0.01 % Soln  Commonly known as:  LUMIGAN  Place 1 drop into  both eyes at bedtime.     bisacodyl 5 MG EC tablet  Commonly known as:  DULCOLAX  Take 2 tablets (10 mg total) by mouth daily as needed for moderate constipation.     DSS 100 MG Caps  Take 200 mg by mouth daily as needed for mild constipation.     fluticasone 44 MCG/ACT inhaler  Commonly known as:  FLOVENT HFA  Inhale 2 puffs into the lungs daily.     food thickener Powd  Commonly known as:  THICK IT  Use orally as needed     furosemide 20 MG tablet  Commonly known as:  LASIX  Take 1 tablet (20 mg total) by mouth daily.     irbesartan 75 MG tablet  Commonly known as:  AVAPRO  Take 1 tablet (75 mg total) by mouth daily.     latanoprost 0.005 % ophthalmic solution  Commonly known as:  XALATAN  Place 1 drop into both eyes at bedtime.     metFORMIN 500 MG 24 hr tablet  Commonly known as:  GLUCOPHAGE-XR  Take 1,000 mg by mouth daily after supper.     metoprolol tartrate 25 MG tablet  Commonly known as:  LOPRESSOR  Take 1 tablet (25 mg total) by mouth 2 (two) times daily.     MYOFLEX 10 % cream  Generic drug:  trolamine salicylate  Apply 1 application topically as needed (for lower back pain).     OCUVITE-LUTEIN PO  Take 1 tablet by mouth every morning.     omeprazole 20 MG capsule  Commonly known as:  PRILOSEC  Take 20 mg by mouth at bedtime.     potassium chloride 10 MEQ tablet  Commonly known as:  K-DUR  Take 1 tablet (10 mEq total) by mouth daily.     pravastatin 40 MG tablet  Commonly known as:  PRAVACHOL  Take 40 mg by mouth daily.     promethazine-codeine 6.25-10 MG/5ML syrup  Commonly known as:  PHENERGAN with CODEINE  Take 5 mLs by mouth every 4 (four) hours as needed for cough.     sertraline 50 MG tablet  Commonly known as:  ZOLOFT  Take 50 mg by mouth every morning.     traMADol 50 MG tablet  Commonly known as:  ULTRAM  Take 1 tablet (50 mg total) by mouth every 6 (six) hours as needed.     TYLENOL 325 MG tablet  Generic drug:  acetaminophen   Take 650 mg by mouth as needed for pain.     VITAMIN D-3 PO  Take 2,000 Units by mouth at bedtime.         The patient has been discharged on:   1.Beta Blocker:  Yes [  x ]                              No   [   ]                              If No, reason:  2.Ace Inhibitor/ARB: Yes [ x  ]                                     No  [    ]                                     If No, reason:  3.Statin:   Yes [ x  ]                  No  [   ]                  If No, reason:  4.Ecasa:  Yes  [x   ]                  No   [   ]                  If No, reason:         Follow-up Information   Follow up with VAN Dinah Beers, MD.  Specialty:  Cardiothoracic Surgery   Contact information:   [address] [place] [address] [phone]       Follow up with [facility].   Contact information:   [place]       Follow up with Elyn Aquas., MD.   Specialty:  Cardiology   Contact information:   [address] [place] [address] [phone]       Signed: Lowella Dandy 07/23/2013, 8:41 AM

## 2013-07-24 ENCOUNTER — Inpatient Hospital Stay (HOSPITAL_COMMUNITY): Payer: Medicare Other

## 2013-07-24 ENCOUNTER — Inpatient Hospital Stay (HOSPITAL_COMMUNITY)
Admission: RE | Admit: 2013-07-24 | Discharge: 2013-08-05 | DRG: 945 | Disposition: A | Discharge status: Home Health | Payer: Medicare Other | Source: Intra-hospital | Attending: Physical Medicine & Rehabilitation | Admitting: Physical Medicine & Rehabilitation

## 2013-07-24 ENCOUNTER — Encounter (HOSPITAL_COMMUNITY): Payer: Self-pay | Admitting: *Deleted

## 2013-07-24 DIAGNOSIS — Z954 Presence of other heart-valve replacement: Secondary | ICD-10-CM

## 2013-07-24 DIAGNOSIS — I2510 Atherosclerotic heart disease of native coronary artery without angina pectoris: Secondary | ICD-10-CM | POA: Diagnosis present

## 2013-07-24 DIAGNOSIS — E1149 Type 2 diabetes mellitus with other diabetic neurological complication: Secondary | ICD-10-CM | POA: Diagnosis present

## 2013-07-24 DIAGNOSIS — E1142 Type 2 diabetes mellitus with diabetic polyneuropathy: Secondary | ICD-10-CM | POA: Diagnosis present

## 2013-07-24 DIAGNOSIS — E785 Hyperlipidemia, unspecified: Secondary | ICD-10-CM

## 2013-07-24 DIAGNOSIS — H409 Unspecified glaucoma: Secondary | ICD-10-CM | POA: Diagnosis present

## 2013-07-24 DIAGNOSIS — Z951 Presence of aortocoronary bypass graft: Secondary | ICD-10-CM | POA: Diagnosis not present

## 2013-07-24 DIAGNOSIS — Z7982 Long term (current) use of aspirin: Secondary | ICD-10-CM

## 2013-07-24 DIAGNOSIS — N289 Disorder of kidney and ureter, unspecified: Secondary | ICD-10-CM | POA: Diagnosis present

## 2013-07-24 DIAGNOSIS — F99 Mental disorder, not otherwise specified: Secondary | ICD-10-CM | POA: Diagnosis present

## 2013-07-24 DIAGNOSIS — D62 Acute posthemorrhagic anemia: Secondary | ICD-10-CM | POA: Diagnosis present

## 2013-07-24 DIAGNOSIS — I350 Nonrheumatic aortic (valve) stenosis: Secondary | ICD-10-CM

## 2013-07-24 DIAGNOSIS — I359 Nonrheumatic aortic valve disorder, unspecified: Secondary | ICD-10-CM | POA: Diagnosis present

## 2013-07-24 DIAGNOSIS — Z87891 Personal history of nicotine dependence: Secondary | ICD-10-CM

## 2013-07-24 DIAGNOSIS — R5381 Other malaise: Secondary | ICD-10-CM | POA: Diagnosis present

## 2013-07-24 DIAGNOSIS — Z952 Presence of prosthetic heart valve: Secondary | ICD-10-CM

## 2013-07-24 DIAGNOSIS — J449 Chronic obstructive pulmonary disease, unspecified: Secondary | ICD-10-CM

## 2013-07-24 DIAGNOSIS — F329 Major depressive disorder, single episode, unspecified: Secondary | ICD-10-CM | POA: Diagnosis present

## 2013-07-24 DIAGNOSIS — J45909 Unspecified asthma, uncomplicated: Secondary | ICD-10-CM | POA: Diagnosis present

## 2013-07-24 DIAGNOSIS — J90 Pleural effusion, not elsewhere classified: Secondary | ICD-10-CM | POA: Diagnosis present

## 2013-07-24 DIAGNOSIS — I10 Essential (primary) hypertension: Secondary | ICD-10-CM | POA: Diagnosis present

## 2013-07-24 DIAGNOSIS — K219 Gastro-esophageal reflux disease without esophagitis: Secondary | ICD-10-CM | POA: Diagnosis present

## 2013-07-24 DIAGNOSIS — F3289 Other specified depressive episodes: Secondary | ICD-10-CM | POA: Diagnosis present

## 2013-07-24 DIAGNOSIS — Z5189 Encounter for other specified aftercare: Secondary | ICD-10-CM | POA: Diagnosis present

## 2013-07-24 LAB — CBC
HCT: 29.1 % — ABNORMAL LOW (ref 36.0–46.0)
Hemoglobin: 9.6 g/dL — ABNORMAL LOW (ref 12.0–15.0)
MCH: 29.6 pg (ref 26.0–34.0)
MCHC: 33 g/dL (ref 30.0–36.0)
RBC: 3.24 MIL/uL — ABNORMAL LOW (ref 3.87–5.11)

## 2013-07-24 LAB — GLUCOSE, CAPILLARY
Glucose-Capillary: 121 mg/dL — ABNORMAL HIGH (ref 70–99)
Glucose-Capillary: 117 mg/dL — ABNORMAL HIGH (ref 70–99)
Glucose-Capillary: 149 mg/dL — ABNORMAL HIGH (ref 70–99)

## 2013-07-24 LAB — CREATININE, SERUM
Creatinine, Ser: 1.3 mg/dL — ABNORMAL HIGH (ref 0.50–1.10)
GFR calc Af Amer: 44 mL/min — ABNORMAL LOW (ref >90)
GFR calc non Af Amer: 38 mL/min — ABNORMAL LOW (ref >90)

## 2013-07-24 MED ORDER — IRBESARTAN 75 MG PO TABS: 75.0000 mg | ORAL_TABLET | Freq: Every day | ORAL | Status: DC

## 2013-07-24 MED ORDER — AMLODIPINE BESYLATE 10 MG PO TABS: 10.0000 mg | ORAL_TABLET | Freq: Every day | ORAL | Status: DC

## 2013-07-24 MED ORDER — METOPROLOL TARTRATE 25 MG PO TABS: 25.0000 mg | ORAL_TABLET | Freq: Two times a day (BID) | ORAL | Status: DC

## 2013-07-24 MED ORDER — ONDANSETRON HCL 4 MG PO TABS: 4.0000 mg | ORAL_TABLET | Freq: Four times a day (QID) | ORAL | Status: DC | PRN

## 2013-07-24 MED ORDER — STARCH (THICKENING) PO POWD: ORAL | Status: DC

## 2013-07-24 MED ORDER — ASPIRIN 81 MG PO CHEW
324.0000 mg | CHEWABLE_TABLET | Freq: Every day | ORAL | Status: DC
  Administered 2013-08-05: 324 mg
  Filled 2013-07-24 (×12): qty 4

## 2013-07-24 MED ORDER — FUROSEMIDE 20 MG PO TABS
20.0000 mg | ORAL_TABLET | Freq: Every day | ORAL | Status: DC
  Administered 2013-07-24: 20 mg via ORAL
  Filled 2013-07-24 (×3): qty 1

## 2013-07-24 MED ORDER — METOPROLOL TARTRATE 25 MG PO TABS
25.0000 mg | ORAL_TABLET | Freq: Two times a day (BID) | ORAL | Status: DC
  Administered 2013-07-24 – 2013-08-05 (×24): 25 mg via ORAL
  Filled 2013-07-24 (×27): qty 1

## 2013-07-24 MED ORDER — DOCUSATE SODIUM 100 MG PO CAPS
200.0000 mg | ORAL_CAPSULE | Freq: Every day | ORAL | Status: DC
  Administered 2013-07-24 – 2013-08-05 (×13): 200 mg via ORAL
  Filled 2013-07-24 (×16): qty 2

## 2013-07-24 MED ORDER — LATANOPROST 0.005 % OP SOLN
1.0000 [drp] | Freq: Every day | OPHTHALMIC | Status: DC
  Administered 2013-07-24 – 2013-08-04 (×12): 1 [drp] via OPHTHALMIC
  Filled 2013-07-24: qty 2.5

## 2013-07-24 MED ORDER — SERTRALINE HCL 50 MG PO TABS
50.0000 mg | ORAL_TABLET | Freq: Every morning | ORAL | Status: DC
  Administered 2013-07-25 – 2013-08-04 (×11): 50 mg via ORAL
  Filled 2013-07-24 (×12): qty 1

## 2013-07-24 MED ORDER — FUROSEMIDE 20 MG PO TABS: 20.0000 mg | ORAL_TABLET | Freq: Every day | ORAL | Status: DC

## 2013-07-24 MED ORDER — FLUTICASONE PROPIONATE HFA 44 MCG/ACT IN AERO
2.0000 | INHALATION_SPRAY | Freq: Every day | RESPIRATORY_TRACT | Status: DC
Start: 2013-07-25 — End: 2013-08-05
  Administered 2013-07-25 – 2013-08-04 (×8): 2 via RESPIRATORY_TRACT
  Filled 2013-07-24 (×2): qty 10.6

## 2013-07-24 MED ORDER — ONDANSETRON HCL 4 MG/2ML IJ SOLN: 4.0000 mg | Freq: Four times a day (QID) | INTRAMUSCULAR | Status: DC | PRN

## 2013-07-24 MED ORDER — POTASSIUM CHLORIDE ER 10 MEQ PO TBCR
10.0000 meq | EXTENDED_RELEASE_TABLET | Freq: Every day | ORAL | Status: DC
  Administered 2013-07-25 – 2013-08-05 (×12): 10 meq via ORAL
  Filled 2013-07-24 (×16): qty 1

## 2013-07-24 MED ORDER — BISACODYL 5 MG PO TBEC: 10.0000 mg | DELAYED_RELEASE_TABLET | Freq: Every day | ORAL | Status: DC | PRN

## 2013-07-24 MED ORDER — IPRATROPIUM-ALBUTEROL 20-100 MCG/ACT IN AERS
2.0000 | INHALATION_SPRAY | Freq: Four times a day (QID) | RESPIRATORY_TRACT | Status: DC
  Administered 2013-07-24 – 2013-07-27 (×8): 2 via RESPIRATORY_TRACT
  Filled 2013-07-24: qty 4

## 2013-07-24 MED ORDER — SORBITOL 70 % SOLN
30.0000 mL | Freq: Every day | Status: DC | PRN
  Administered 2013-07-26: 30 mL via ORAL
  Filled 2013-07-24: qty 30

## 2013-07-24 MED ORDER — TRAZODONE HCL 50 MG PO TABS
50.0000 mg | ORAL_TABLET | Freq: Every evening | ORAL | Status: DC | PRN
  Administered 2013-07-24 – 2013-08-04 (×10): 50 mg via ORAL
  Filled 2013-07-24 (×10): qty 1

## 2013-07-24 MED ORDER — POTASSIUM CHLORIDE ER 10 MEQ PO TBCR: 10.0000 meq | EXTENDED_RELEASE_TABLET | Freq: Every day | ORAL | Status: DC

## 2013-07-24 MED ORDER — LATANOPROST 0.005 % OP SOLN: 1.0000 [drp] | Freq: Every day | OPHTHALMIC | Status: DC

## 2013-07-24 MED ORDER — TRAMADOL HCL 50 MG PO TABS
50.0000 mg | ORAL_TABLET | Freq: Four times a day (QID) | ORAL | Status: DC | PRN
  Administered 2013-07-24 – 2013-08-04 (×16): 50 mg via ORAL
  Filled 2013-07-24 (×17): qty 1

## 2013-07-24 MED ORDER — DSS 100 MG PO CAPS: 200.0000 mg | ORAL_CAPSULE | Freq: Every day | ORAL | Status: DC | PRN

## 2013-07-24 MED ORDER — IRBESARTAN 75 MG PO TABS
75.0000 mg | ORAL_TABLET | Freq: Every day | ORAL | Status: DC
  Administered 2013-07-25 – 2013-08-05 (×12): 75 mg via ORAL
  Filled 2013-07-24 (×15): qty 1

## 2013-07-24 MED ORDER — ASPIRIN EC 325 MG PO TBEC
325.0000 mg | DELAYED_RELEASE_TABLET | Freq: Every day | ORAL | Status: DC
  Administered 2013-07-24 – 2013-08-04 (×12): 325 mg via ORAL
  Filled 2013-07-24 (×14): qty 1

## 2013-07-24 MED ORDER — ENOXAPARIN SODIUM 30 MG/0.3ML ~~LOC~~ SOLN
30.0000 mg | SUBCUTANEOUS | Status: DC
  Administered 2013-07-25 – 2013-08-05 (×12): 30 mg via SUBCUTANEOUS
  Filled 2013-07-24 (×14): qty 0.3

## 2013-07-24 MED ORDER — INSULIN ASPART 100 UNIT/ML ~~LOC~~ SOLN
0.0000 [IU] | Freq: Three times a day (TID) | SUBCUTANEOUS | Status: DC
  Administered 2013-07-24 – 2013-07-25 (×4): 2 [IU] via SUBCUTANEOUS
  Administered 2013-07-25: 4 [IU] via SUBCUTANEOUS
  Administered 2013-07-26 – 2013-07-27 (×4): 2 [IU] via SUBCUTANEOUS
  Administered 2013-07-27: 4 [IU] via SUBCUTANEOUS
  Administered 2013-07-28 – 2013-07-31 (×8): 2 [IU] via SUBCUTANEOUS
  Administered 2013-07-31: 4 [IU] via SUBCUTANEOUS
  Administered 2013-08-01 (×2): 2 [IU] via SUBCUTANEOUS
  Administered 2013-08-02: 4 [IU] via SUBCUTANEOUS
  Administered 2013-08-03 – 2013-08-05 (×6): 2 [IU] via SUBCUTANEOUS

## 2013-07-24 MED ORDER — AMLODIPINE BESYLATE 5 MG PO TABS
5.0000 mg | ORAL_TABLET | Freq: Every day | ORAL | Status: DC
  Administered 2013-07-25 – 2013-08-05 (×12): 5 mg via ORAL
  Filled 2013-07-24 (×16): qty 1

## 2013-07-24 MED ORDER — PANTOPRAZOLE SODIUM 40 MG PO TBEC
40.0000 mg | DELAYED_RELEASE_TABLET | Freq: Every day | ORAL | Status: DC
  Administered 2013-07-25 – 2013-08-04 (×11): 40 mg via ORAL
  Filled 2013-07-24 (×13): qty 1

## 2013-07-24 MED ORDER — SIMVASTATIN 10 MG PO TABS
10.0000 mg | ORAL_TABLET | Freq: Every day | ORAL | Status: DC
  Administered 2013-07-24 – 2013-08-04 (×12): 10 mg via ORAL
  Filled 2013-07-24 (×13): qty 1

## 2013-07-24 MED ORDER — ENOXAPARIN SODIUM 30 MG/0.3ML ~~LOC~~ SOLN: 30.0000 mg | SUBCUTANEOUS | Status: DC

## 2013-07-24 MED ORDER — ASPIRIN 325 MG PO TBEC: 325.0000 mg | DELAYED_RELEASE_TABLET | Freq: Every day | ORAL | Status: DC

## 2013-07-24 MED ORDER — LEVALBUTEROL HCL 1.25 MG/0.5ML IN NEBU
1.2500 mg | INHALATION_SOLUTION | Freq: Four times a day (QID) | RESPIRATORY_TRACT | Status: DC | PRN
  Administered 2013-07-27: 1.25 mg via RESPIRATORY_TRACT
  Filled 2013-07-24: qty 0.5

## 2013-07-24 MED ORDER — TRAMADOL HCL 50 MG PO TABS: 50.0000 mg | ORAL_TABLET | Freq: Four times a day (QID) | ORAL | Status: DC | PRN

## 2013-07-24 NOTE — Progress Notes (Signed)
Gave report and transferred pt to rehab

## 2013-07-24 NOTE — H&P (Signed)
Physical Medicine and Rehabilitation Admission H&P  No chief complaint on file.   : Chief complaint: Progressive shortness of breath with activity  HPI: [name] is a 79 y.o. right-handed female admitted 07/17/2013 with progressive shortness of breath/chest pain and known history of aortic stenosis. Cardiac catheterization showed 70% stenosis in the mid LAD and LV systolic function fairly well preserved. Underwent aortic valve replacement/CABG x1 07/17/2013 per Dr. Zenaida Niece trigt and placed on aspirin therapy. Subcutaneous Lovenox initiated 07/22/2013 for DVT prophylaxis. Postoperatively with bouts of confusion decreased awareness. Cranial CT scan showed no acute abnormalities. Acute blood loss anemia 8.5-9.1 and monitored. Followup speech therapy for questionable dysphagia diet since been advanced to mechanical soft with thin liquids and tolerating nicely. Sternal precautions implemented after CABG. Physical and occupational therapy evaluations completed 07/19/2013 with recommendations for physical medicine rehabilitation consult to consider inpatient rehabilitation services   ROS Review of Systems  Respiratory: Positive for shortness of breath.  Cardiovascular: Positive for chest pain and palpitations.  Gastrointestinal:  GERD  Musculoskeletal: Positive for myalgias.  Psychiatric/Behavioral: Positive for depression.  Anxiety  All other systems reviewed and are negative  Past Medical History   Diagnosis  Date   .  Hypertension    .  Asthma      uses inhaler as needed   .  Diabetes mellitus    .  GERD (gastroesophageal reflux disease)    .  Arthritis    .  Anxiety    .  Depression    .  Hyperlipidemia    .  Emphysema of lung    .  Glaucoma    .  [name]    .  Aortic stenosis     Past Surgical History   Procedure  Laterality  Date   .  Ankle surgery   1998     d/t fx. Left ankle   .  Facial cosmetic surgery       face lift   .  Nose surgery     .   Laparoscopic assisted vaginal hysterectomy   06/21/2011     Procedure: LAPAROSCOPIC ASSISTED VAGINAL HYSTERECTOMY; Surgeon: Freddy Finner; Location: [facility]; Service: Gynecology; Laterality: N/A;   .  Salpingoophorectomy   06/21/2011     Procedure: SALPINGO OOPHERECTOMY; Surgeon: Freddy Finner; Location: [facility]; Service: Gynecology; Laterality: Bilateral;   .  Anterior and posterior repair   06/21/2011     Procedure: ANTERIOR (CYSTOCELE) AND POSTERIOR REPAIR (RECTOCELE); Surgeon: Freddy Finner; Location: [facility]; Service: Gynecology; Laterality: N/A;   .  Vaginal prolapse repair   06/21/2011     Procedure: SACROPEXY; Surgeon: Freddy Finner; Location: [facility]; Service: Gynecology; Laterality: N/A; sacrospinous ligament suspension   .  Cardiac catheterization     .  Aortic valve replacement  N/A  07/17/2013     Procedure: AORTIC VALVE REPLACEMENT (AVR); Surgeon: Kerin Perna, MD; Location: [facility] OR; Service: Open Heart Surgery; Laterality: N/A;   .  Coronary artery bypass graft  N/A  07/17/2013     Procedure: CORONARY ARTERY BYPASS GRAFT, TIMES ONE, ON PUMP, USING RIGHT INTERNAL MAMMARY ARTERY.; Surgeon: Kerin Perna, MD; Location: [facility] OR; Service: Open Heart Surgery; Laterality: N/A;   .  Intraoperative transesophageal echocardiogram  N/A  07/17/2013     Procedure: INTRAOPERATIVE TRANSESOPHAGEAL ECHOCARDIOGRAM; Surgeon: Kerin Perna, MD; Location: [facility] OR; Service: Open Heart Surgery; Laterality: N/A;    Family History   Problem  Relation  Age of  Onset   .  Emphysema  Father    .  Colon cancer  Neg Hx    .  Esophageal cancer  Neg Hx    .  Rectal cancer  Neg Hx    .  Stomach cancer  Neg Hx     Social History: reports that she quit smoking about 40 years ago. Her smoking use included Cigarettes. She has a 30 pack-year smoking history. She has never used smokeless tobacco. She reports that she does not drink alcohol or use illicit drugs.  Allergies: No Known Allergies  Medications Prior to  Admission   Medication  Sig  Dispense  Refill   .  acetaminophen (TYLENOL) 325 MG tablet  Take 650 mg by mouth as needed for pain.     [name]  amLODipine (NORVASC) 5 MG tablet  Take 5 mg by mouth daily.     [name]  aspirin 325 MG tablet  Take 650 mg by mouth as needed for pain.     [name]  aspirin 81 MG tablet  Take 81 mg by mouth at bedtime.     .  bimatoprost (LUMIGAN) 0.01 % SOLN  Place 1 drop into both eyes at bedtime.     .  Cholecalciferol (VITAMIN D-3 PO)  Take 2,000 Units by mouth at bedtime.     .  fluticasone (FLOVENT HFA) 44 MCG/ACT inhaler  Inhale 2 puffs into the lungs daily.     .  metFORMIN (GLUCOPHAGE-XR) 500 MG 24 hr tablet  Take 1,000 mg by mouth daily after supper.     .  Multiple Vitamins-Minerals (OCUVITE-LUTEIN PO)  Take 1 tablet by mouth every morning.     .  mupirocin ointment (BACTROBAN) 2 %  Apply topically 2 (two) times daily. APPLY TO THE INSIDE OF BOTH NOSTRILS TWICE A DAY X 5 DAYS  22 g  0   .  omeprazole (PRILOSEC) 20 MG capsule  Take 20 mg by mouth at bedtime.     .  pravastatin (PRAVACHOL) 40 MG tablet  Take 40 mg by mouth daily.     .  promethazine-codeine (PHENERGAN WITH CODEINE) 6.25-10 MG/5ML syrup  Take 5 mLs by mouth every 4 (four) hours as needed for cough.     .  sertraline (ZOLOFT) 50 MG tablet  Take 50 mg by mouth every morning.     .  trolamine salicylate (MYOFLEX) 10 % cream  Apply 1 application topically as needed (for lower back pain).     .  valsartan-hydrochlorothiazide (DIOVAN-HCT) 320-12.5 MG per tablet  Take 1 tablet by mouth every morning.      Home:  Home Living  Family/patient expects to be discharged to:: Private residence  Living Arrangements: Spouse/significant other  Available Help at Discharge: Family;Available 24 hours/day  Type of Home: House  Home Access: Stairs to enter  [employer] of Steps: 3  Home Equipment: Walker - 2 wheels;Grab bars - toilet;Grab bars - tub/shower  Additional Comments: Pt reports husband is able to provide  24 hour assist at necessary  Functional History:  Prior Function  Comments: per pt report she was independent  Functional Status:  Mobility:  Bed Mobility  Bed Mobility: Rolling Left;Left Sidelying to Sit;Sitting - Scoot to Edge of Bed  Rolling Right: 1: +1 Total assist  Rolling Left: 4: Min assist  Right Sidelying to Sit: 1: +1 Total assist;HOB elevated  Left Sidelying to Sit: 4: Min assist  Sitting - Scoot to Edge of Bed: 4: Min  assist  Transfers  Transfers: Sit to Stand;Stand to Sit  Sit to Stand: 4: Min assist;Not tested (comment);From bed  Stand to Sit: 4: Min assist;To chair/3-in-1  Squat Pivot Transfers: 1: +1 Total assist;From elevated surface  Ambulation/Gait  Ambulation/Gait Assistance: 3: Mod assist  Ambulation Distance (Feet): 70 Feet  Assistive device: Other (Comment) (WC to hold lines and monitor)  Ambulation/Gait Assistance Details: mod assist to steady pt's trunk for balance during gait. She is easily distracted in the hallway and this increases her LOB.  Gait Pattern: Step-through pattern;Shuffle;Lateral trunk lean to right  Gait velocity: decreased  General Gait Details: O2 sats remained stable on O2 [place]   ADL:  ADL  Eating/Feeding: Supervision/safety  Where Assessed - Eating/Feeding: Chair  Grooming: Wash/dry hands;Wash/dry face;Brushing hair  Where Assessed - Grooming: Supported sitting  Upper Body Bathing: Minimal assistance  Where Assessed - Upper Body Bathing: Supported sitting  Lower Body Bathing: Maximal assistance  Where Assessed - Lower Body Bathing: Supported sit to stand  Upper Body Dressing: Moderate assistance  Where Assessed - Upper Body Dressing: Unsupported sitting  Lower Body Dressing: +1 Total assistance  Where Assessed - Lower Body Dressing: Supported sit to Scientist, research (life sciences): Moderate assistance  Toilet Transfer Method: Stand pivot;Sit to Production manager: Bedside commode  Transfers/Ambulation Related to ADLs: Pt  requires mod A and max verbal cues for sequencing  ADL Comments: Pt instructed in sternal precautions and how they impact BADLs  Cognition:  Cognition  Overall Cognitive Status: Impaired/Different from baseline  Orientation Level: Oriented X4  Cognition  Arousal/Alertness: Awake/alert  Behavior During Therapy: WFL for tasks assessed/performed  Overall Cognitive Status: Impaired/Different from baseline  Area of Impairment: Attention;Memory;Problem solving  Orientation Level: Time  Current Attention Level: Selective  Memory: Decreased recall of precautions  Following Commands: Follows one step commands inconsistently;Follows one step commands with increased time  Safety/Judgement: Decreased awareness of safety;Decreased awareness of deficits  Problem Solving: Difficulty sequencing;Requires verbal cues;Requires tactile cues  General Comments: Pt is very distractable within herself and in a busy hallway environment and when she gets distracted and is on her feet she loses her balance more.  Physical Exam:  Blood pressure 172/69, pulse 82, temperature 98.3 F (36.8 C), temperature source Oral, resp. rate 20, height 5' (1.524 m), weight 58.6 kg (129 lb 3 oz), SpO2 92.00%.  Physical Exam  Constitutional: Patient was able to provide her name, place and date of birth. She had some difficulty recalling her full hospital course .She followed simple commands  Frail appearing.  HENT:  Head: Normocephalic.  Eyes: EOM are normal.  Neck: Normal range of motion. Neck supple. No thyromegaly present.  Cardiovascular:  Cardiac rate controlled  Respiratory:  Decreased breath sounds at the left base . Slightly dsypneic  GI: Soft. Bowel sounds are normal. She exhibits no distension.  Neurological: She is alert.  No CN deficits. Generalized proximal greater than distal weakness. 3 -->>4/5. No sensory findings. dtr's 2+ at bilateral patellar and Achilles.  Skin:  Midline chest incision clean and dry    motor strength is 5/5 in the right upper, 4/5 in the left biceps otherwise 5/5 4/5 bilateral hip flexors knee extensors ankle dorsiflexors and plantar flexors Sensation intact to light touch in bilateral upper and lower limbs Results for orders placed during the hospital encounter of 07/17/13 (from the past 48 hour(s))   GLUCOSE, CAPILLARY Status: None    Collection Time    07/21/13 8:01 AM   Result  Value  Range    Glucose-Capillary  77  70 - 99 mg/dL    Comment 1  Documented in Chart     Comment 2  Notify RN    BASIC METABOLIC PANEL Status: Abnormal    Collection Time    07/21/13 9:00 AM   Result  Value  Range    Sodium  141  135 - 145 mEq/L    Potassium  3.4 (*)  3.5 - 5.1 mEq/L    Chloride  102  96 - 112 mEq/L    CO2  26  19 - 32 mEq/L    Glucose, Bld  83  70 - 99 mg/dL    BUN  35 (*)  6 - 23 mg/dL    Creatinine, Ser  4.09 (*)  0.50 - 1.10 mg/dL    Calcium  9.1  8.4 - 10.5 mg/dL    GFR calc non Af Amer  44 (*)  >90 mL/min    GFR calc Af Amer  51 (*)  >90 mL/min    Comment:  (NOTE)     The eGFR has been calculated using the CKD EPI equation.     This calculation has not been validated in all clinical situations.     eGFR's persistently <90 mL/min signify possible Chronic Kidney     Disease.   CBC Status: Abnormal    Collection Time    07/21/13 9:00 AM   Result  Value  Range    WBC  14.8 (*)  4.0 - 10.5 K/uL    RBC  3.56 (*)  3.87 - 5.11 MIL/uL    Hemoglobin  10.4 (*)  12.0 - 15.0 g/dL    Comment:  REPEATED TO VERIFY    HCT  31.6 (*)  36.0 - 46.0 %    MCV  88.8  78.0 - 100.0 fL    MCH  29.2  26.0 - 34.0 pg    MCHC  32.9  30.0 - 36.0 g/dL    RDW  81.1  91.4 - 78.2 %    Platelets  168  150 - 400 K/uL    Comment:  REPEATED TO VERIFY   GLUCOSE, CAPILLARY Status: Abnormal    Collection Time    07/21/13 9:46 AM   Result  Value  Range    Glucose-Capillary  162 (*)  70 - 99 mg/dL   GLUCOSE, CAPILLARY Status: Abnormal    Collection Time    07/21/13 12:06 PM   Result   Value  Range    Glucose-Capillary  137 (*)  70 - 99 mg/dL    Comment 1  Documented in Chart     Comment 2  Notify RN    GLUCOSE, CAPILLARY Status: None    Collection Time    07/21/13 4:17 PM   Result  Value  Range    Glucose-Capillary  79  70 - 99 mg/dL    Comment 1  Documented in Chart     Comment 2  Notify RN    GLUCOSE, CAPILLARY Status: Abnormal    Collection Time    07/21/13 7:44 PM   Result  Value  Range    Glucose-Capillary  178 (*)  70 - 99 mg/dL   GLUCOSE, CAPILLARY Status: Abnormal    Collection Time    07/21/13 11:51 PM   Result  Value  Range    Glucose-Capillary  40 (*)  70 - 99 mg/dL   GLUCOSE, CAPILLARY Status: Abnormal    Collection Time  07/22/13 12:10 AM   Result  Value  Range    Glucose-Capillary  61 (*)  70 - 99 mg/dL   GLUCOSE, CAPILLARY Status: Abnormal    Collection Time    07/22/13 12:43 AM   Result  Value  Range    Glucose-Capillary  174 (*)  70 - 99 mg/dL   GLUCOSE, CAPILLARY Status: Abnormal    Collection Time    07/22/13 4:04 AM   Result  Value  Range    Glucose-Capillary  158 (*)  70 - 99 mg/dL   CBC Status: Abnormal    Collection Time    07/22/13 7:15 AM   Result  Value  Range    WBC  11.1 (*)  4.0 - 10.5 K/uL    RBC  3.33 (*)  3.87 - 5.11 MIL/uL    Hemoglobin  9.7 (*)  12.0 - 15.0 g/dL    HCT  16.1 (*)  09.6 - 46.0 %    MCV  89.2  78.0 - 100.0 fL    MCH  29.1  26.0 - 34.0 pg    MCHC  32.7  30.0 - 36.0 g/dL    RDW  04.5  40.9 - 81.1 %    Platelets  171  150 - 400 K/uL   BASIC METABOLIC PANEL Status: Abnormal    Collection Time    07/22/13 7:15 AM   Result  Value  Range    Sodium  140  135 - 145 mEq/L    Potassium  3.8  3.5 - 5.1 mEq/L    Chloride  102  96 - 112 mEq/L    CO2  28  19 - 32 mEq/L    Glucose, Bld  105 (*)  70 - 99 mg/dL    BUN  37 (*)  6 - 23 mg/dL    Creatinine, Ser  9.14 (*)  0.50 - 1.10 mg/dL    Calcium  8.8  8.4 - 10.5 mg/dL    GFR calc non Af Amer  45 (*)  >90 mL/min    GFR calc Af Amer  53 (*)  >90 mL/min     Comment:  (NOTE)     The eGFR has been calculated using the CKD EPI equation.     This calculation has not been validated in all clinical situations.     eGFR's persistently <90 mL/min signify possible Chronic Kidney     Disease.   GLUCOSE, CAPILLARY Status: None    Collection Time    07/22/13 7:48 AM   Result  Value  Range    Glucose-Capillary  98  70 - 99 mg/dL    Comment 1  Documented in Chart     Comment 2  Notify RN    GLUCOSE, CAPILLARY Status: Abnormal    Collection Time    07/22/13 12:02 PM   Result  Value  Range    Glucose-Capillary  181 (*)  70 - 99 mg/dL    Comment 1  Documented in Chart     Comment 2  Notify RN    GLUCOSE, CAPILLARY Status: None    Collection Time    07/22/13 4:17 PM   Result  Value  Range    Glucose-Capillary  78  70 - 99 mg/dL   GLUCOSE, CAPILLARY Status: Abnormal    Collection Time    07/22/13 9:01 PM   Result  Value  Range    Glucose-Capillary  147 (*)  70 - 99 mg/dL   CBC  Status: Abnormal    Collection Time    07/23/13 4:15 AM   Result  Value  Range    WBC  7.5  4.0 - 10.5 K/uL    RBC  3.11 (*)  3.87 - 5.11 MIL/uL    Hemoglobin  9.1 (*)  12.0 - 15.0 g/dL    HCT  11.9 (*)  14.7 - 46.0 %    MCV  90.0  78.0 - 100.0 fL    MCH  29.3  26.0 - 34.0 pg    MCHC  32.5  30.0 - 36.0 g/dL    RDW  82.9  56.2 - 13.0 %    Platelets  173  150 - 400 K/uL   BASIC METABOLIC PANEL Status: Abnormal    Collection Time    07/23/13 4:15 AM   Result  Value  Range    Sodium  142  135 - 145 mEq/L    Potassium  4.0  3.5 - 5.1 mEq/L    Chloride  106  96 - 112 mEq/L    CO2  26  19 - 32 mEq/L    Glucose, Bld  133 (*)  70 - 99 mg/dL    BUN  38 (*)  6 - 23 mg/dL    Creatinine, Ser  8.65 (*)  0.50 - 1.10 mg/dL    Calcium  8.6  8.4 - 10.5 mg/dL    GFR calc non Af Amer  45 (*)  >90 mL/min    GFR calc Af Amer  52 (*)  >90 mL/min    Comment:  (NOTE)     The eGFR has been calculated using the CKD EPI equation.     This calculation has not been validated in all  clinical situations.     eGFR's persistently <90 mL/min signify possible Chronic Kidney     Disease.   GLUCOSE, CAPILLARY Status: Abnormal    Collection Time    07/23/13 5:55 AM   Result  Value  Range    Glucose-Capillary  133 (*)  70 - 99 mg/dL    Dg Chest 2 View  78/12/6960 CLINICAL DATA: CABG EXAM: CHEST 2 VIEW COMPARISON: 07/21/2013 FINDINGS: Mediastinum and hilar structures are normal. Prior CABG. Prior aortic valve replacement. Heart size normal. Pulmonary vascularity normal. Left lower lobe mild infiltrate versus atelectasis with small pleural effusion noted. The pleural effusion has improved slightly from prior study. IMPRESSION: 1. Prior CABG and aortic valve replacement. 2. Mild left lower lobe infiltrate versus atelectasis. Small left pleural effusion. Left pleural effusion has improved slightly from prior exam. Electronically Signed By: Maisie Fus Register On: 07/22/2013 06:52  Dg Chest Port 1 View  07/21/2013 CLINICAL DATA: AVR. Weakness, CABG. EXAM: PORTABLE CHEST - 1 VIEW COMPARISON: 07/20/2013 FINDINGS: The sternotomy wires and prosthetic aortic valve are unchanged. Lungs are adequately inflated with slight improvement of a small to moderate left pleural effusion likely with associated atelectasis. Cardiomediastinal silhouette is within normal. There is mild atherosclerotic disease of the thoracic aorta. Remainder of the exam is unchanged. IMPRESSION: Small to moderate left pleural effusion with slight improvement. Likely associated atelectasis in the left base. Electronically Signed By: Elberta Fortis M.D. On: 07/21/2013 08:11   Post Admission Physician Evaluation:  1. Functional deficits secondary to coronary artery disease and aortic stenosis status post aVR and CABG. 2. Patient is admitted to receive collaborative, interdisciplinary care between the physiatrist, rehab nursing staff, and therapy team. 3. Patient's level of medical complexity and substantial therapy needs in context of  that medical necessity cannot be provided at a lesser intensity of care such as a SNF. 4. Patient has experienced substantial functional loss from his/her baseline which was documented above under the "Functional History" and "Functional Status" headings. Judging by the patient's diagnosis, physical exam, and functional history, the patient has potential for functional progress which will result in measurable gains while on inpatient rehab. These gains will be of substantial and practical use upon discharge in facilitating mobility and self-care at the household level. 5. Physiatrist will provide 24 hour management of medical needs as well as oversight of the therapy plan/treatment and provide guidance as appropriate regarding the interaction of the two. 6. 24 hour rehab nursing will assist with bowel management, safety, skin/wound care, disease management, medication administration, pain management and patient education and help integrate therapy concepts, techniques,education, etc. 7. PT will assess and treat for/with: pre gait, gait training, endurance , safety, equipment, neuromuscular re education. Goals are:  supervision and assistance . 8. OT will assess and treat for/with: ADLs, Cognitive perceptual skills, Neuromuscular re education, safety, endurance, equipment. Goals are:  supervision and assistance . 9. SLP will assess and treat for/with:  NA . Goals are: NA . 10. Case Management and Social Worker will assess and treat for psychological issues and discharge planning. 11. Team conference will be held weekly to assess progress toward goals and to determine barriers to discharge. 12. Patient will receive at least 3 hours of therapy per day at least 5 days per week. 13. ELOS: 10-12 days   14. Prognosis: good Medical Problem List and Plan:  1. Deconditioning CAD/aortic stenosis/status post AVR/CABG 07/17/2013  2. DVT Prophylaxis/Anticoagulation: Subcutaneous Lovenox. Monitor platelet counts and  any signs of bleeding  3. Pain Management: Ultram 50 mg every 6 hours as needed. Monitor with increased activity  4. Mood/depression. Zoloft 50 mg daily. Provide emotional support  5. Neuropsych: This patient is capable of making decisions on her own behalf.  6. Acute blood loss anemia. Latest hemoglobin 9.1. Followup CBC  7. Hypertension. Norvasc 5 mg daily, Lasix 20 mg daily, Avapro 75 mg daily and Lopressor 25 mg twice a day. Monitor with increased activity  8. History of asthma. Continue inhalers as directed. Check oxygen saturations every shift.  9. GERD. Protonix  10. Hyperlipidemia. Zocor  11. Diabetes mellitus with peripheral neuropathy. Hemoglobin A1c 6.6. Resume Glucophage as tolerated as prior to admission.  12. Left pleural effusion will monitor for increasing shortness of breath.  Erick Colace M.D. [facility] FAAPM&R (Sports Med, Neuromuscular Med) Diplomate Am Board of Electrodiagnostic Med Diplomate Am Board of Pain Medicine Fellow Am Board of Interventional Pain Physicians

## 2013-07-24 NOTE — Progress Notes (Signed)
Patient approved by [facility] for admission to inpt rehab today. I will arrange. Patient is in agreement. [phone]

## 2013-07-24 NOTE — Progress Notes (Signed)
      [address]       [place] [address]             [phone]      7 Days Post-Op Procedure(s) (LRB): AORTIC VALVE REPLACEMENT (AVR) (N/A) CORONARY ARTERY BYPASS GRAFT, TIMES ONE, ON PUMP, USING RIGHT INTERNAL MAMMARY ARTERY. (N/A) INTRAOPERATIVE TRANSESOPHAGEAL ECHOCARDIOGRAM (N/A)  Subjective:  Mrs. Blankenhorn continues to progress.  She did have some episodic shortness of breath yesterday.  She also complains of loose stools and states she no longer wants to take stool softners. Objective: Vital signs in last 24 hours: Temp:  [97.4 F (36.3 C)-98.1 F (36.7 C)] 97.4 F (36.3 C) (11/06 0428) Pulse Rate:  [79-96] 79 (11/06 0428) Cardiac Rhythm:  [-] Normal sinus rhythm (11/05 2030) Resp:  [18-21] 18 (11/06 0428) BP: (140-183)/(65-91) 145/65 mmHg (11/06 0428) SpO2:  [90 %-98 %] 92 % (11/06 0428) Weight:  [127 lb (57.607 kg)] 127 lb (57.607 kg) (11/06 0428)  Intake/Output from previous day: 11/05 0701 - 11/06 0700 In: 1200 [P.O.:1200] Out: 1102 [Urine:1100; Stool:2]  General appearance: alert, cooperative and no distress Heart: regular rate and rhythm Lungs: diminished breath sounds left base Abdomen: soft, non-tender; bowel sounds normal; no masses,  no organomegaly Extremities: extremities normal, atraumatic, no cyanosis or edema Wound: clean and dry  Lab Results:  Recent Labs  07/22/13 0715 07/23/13 0415  WBC 11.1* 7.5  HGB 9.7* 9.1*  HCT 29.7* 28.0*  PLT 171 173   BMET:  Recent Labs  07/22/13 0715 07/23/13 0415  NA 140 142  K 3.8 4.0  CL 102 106  CO2 28 26  GLUCOSE 105* 133*  BUN 37* 38*  CREATININE 1.13* 1.14*  CALCIUM 8.8 8.6    PT/INR: No results found for this basename: LABPROT, INR,  in the last 72 hours ABG    Component Value Date/Time   PHART 7.322* 07/19/2013 0504   HCO3 21.0 07/19/2013 0504   TCO2 22 07/19/2013 0504   ACIDBASEDEF 5.0* 07/19/2013 0504   O2SAT 96.0 07/19/2013 0504   CBG (last 3)   Recent Labs  07/23/13 1610 07/23/13 2103 07/24/13 0625  GLUCAP 170* 197* 121*    Assessment/Plan: S/P Procedure(s) (LRB): AORTIC VALVE REPLACEMENT (AVR) (N/A) CORONARY ARTERY BYPASS GRAFT, TIMES ONE, ON PUMP, USING RIGHT INTERNAL MAMMARY ARTERY. (N/A) INTRAOPERATIVE TRANSESOPHAGEAL ECHOCARDIOGRAM (N/A)  1. CV- NSR, Hypertension stable- will increase Norvasc, continue Avapro, Beta Blocker 2. Pulm- wean oxygen as tolerated, small effusion on left will monitor, continue IS 3. Renal- volume status remains stable, continue diuresis for now 4. DM- CBGs controlled continue regimen 5. Deconditioning- patient for CIR 6. Dispo- patient stable, will plan to d/c to CIR today   LOS: 7 days    Raford Pitcher, [place] 07/24/2013

## 2013-07-24 NOTE — Care Management Note (Signed)
    Page 1 of 1   07/24/2013     2:10:27 PM   CARE MANAGEMENT NOTE 07/24/2013  Patient:  [name], [name]   Account Number:  [id number]  Date Initiated:  07/21/2013  Documentation initiated by:  MAYO,HENRIETTA  Subjective/Objective Assessment:   adm s/p CABG, AVR; lives with spouse    PCP  Dr Laurann Montana     Action/Plan:   Anticipated DC Date:  07/24/2013   Anticipated DC Plan:  IP REHAB FACILITY      DC Planning Services  CM consult      Choice offered to / List presented to:             Status of service:  Completed, signed off Medicare Important Message given?   (If response is "NO", the following Medicare IM given date fields will be blank) Date Medicare IM given:   Date Additional Medicare IM given:    Discharge Disposition:  IP REHAB FACILITY  Per UR Regulation:  Reviewed for med. necessity/level of care/duration of stay  If discussed at Long Length of Stay Meetings, dates discussed:   07/22/2013    Comments:  07/24/13 Walker Sitar,RN,BSN [phone] PT DISCHARGING TO CIR TODAY.  07/21/13 1137 Henrietta Mayo RN MSN BSN CCM ST eval - dysphagia diet.  PT recommends CIR - anticipate transfer to CIR when medically stable.

## 2013-07-24 NOTE — Progress Notes (Signed)
Physical Therapy Treatment Patient Details Name: [name] MRN: [id number] DOB: [birth year] Today's Date: 07/24/2013 Time: 5621-3086 PT Time Calculation (min): 32 min  PT Assessment / Plan / Recommendation  History of Present Illness [name]  has presented for surgery, with the diagnosis of Chest pain  s/p AVR, CABG. Pt with AMS postop with decreased awareness and ability to follow commands and well as generalized weakness   PT Comments   Pt with improved activity tolerance and balance today, requires only min A for transfers without use of UEs from bed, mod A for toilet transfer without UEs.  Pt able to gait 100' with standing rest breaks due to DOE.  Pt continues to be appropriate for CIR to increase functional independence.  Follow Up Recommendations  CIR     Does the patient have the potential to tolerate intense rehabilitation     Barriers to Discharge        Equipment Recommendations  None recommended by PT    Recommendations for Other Services    Frequency Min 3X/week   Progress towards PT Goals Progress towards PT goals: Progressing toward goals  Plan Current plan remains appropriate    Precautions / Restrictions Precautions Precautions: Sternal;Fall Restrictions Weight Bearing Restrictions: No   Pertinent Vitals/Pain spO2 90% on 2Lnc during treatment    Mobility  Transfers Sit to Stand: 4: Min assist;From bed Stand to Sit: 4: Min guard;To bed Details for Transfer Assistance: min A without UE support.  pt educated on sternal precautions and not to use UEs during sit to stand Ambulation/Gait Ambulation/Gait Assistance: 4: Min Environmental consultant (Feet): 100 Feet Assistive device: Rolling walker Ambulation/Gait Assistance Details: Pt requires cues to stay close to RW, slow cadence with 3 x standing rest break due to DOE, spO2 90% on 2Lnc    Exercises     PT Diagnosis:    PT Problem List:   PT Treatment Interventions:     PT Goals (current  goals can now be found in the care plan section)    Visit Information  Last PT Received On: 07/24/13 Assistance Needed: +1 History of Present Illness: [name]  has presented for surgery, with the diagnosis of Chest pain  s/p AVR, CABG. Pt with AMS postop with decreased awareness and ability to follow commands and well as generalized weakness    Subjective Data      Cognition  Cognition Arousal/Alertness: Awake/alert Behavior During Therapy: WFL for tasks assessed/performed Overall Cognitive Status: Within Functional Limits for tasks assessed    Balance  Static Standing Balance Static Standing - Balance Support: During functional activity;No upper extremity supported Static Standing - Level of Assistance: 4: Min assist Static Standing - Comment/# of Minutes: standing x 3 minutes for washing hands after toileting  End of Session PT - End of Session Equipment Utilized During Treatment: Gait belt;Oxygen Activity Tolerance: Patient tolerated treatment well Patient left: in bed;with call bell/phone within reach Nurse Communication: Mobility status   GP     Annarose Ouellet 07/24/2013, 8:55 AM

## 2013-07-24 NOTE — Plan of Care (Signed)
Problem: RH PAIN MANAGEMENT Goal: RH STG PAIN MANAGED AT OR BELOW PT'S PAIN GOAL Pain level 2.on scale 1 to 10

## 2013-07-24 NOTE — Progress Notes (Signed)
Received pt. From 2W.pt. Alert and oriented,from home with husband.Pt's skin does not have any pressure ulcers.pt. Has a mid-chest incision open to air.keep monitoring pt. Closely and assessing her needs.

## 2013-07-24 NOTE — Plan of Care (Signed)
Problem: RH BOWEL ELIMINATION Goal: RH STG MANAGE BOWEL WITH ASSISTANCE STG Manage Bowel with Assistance.  Pt. Will manage bowel with mod. assist.

## 2013-07-24 NOTE — Progress Notes (Signed)
Pt just walked with PT. Discussed with pt and husband d/c ed. Interested in [facility] and will send referral to [facility]. [id number] Ethelda Chick CES, ACSM 10:18 AM 07/24/2013

## 2013-07-24 NOTE — PMR Pre-admission (Signed)
PMR Admission Coordinator Pre-Admission Assessment  Patient: [name] is an 79 y.o., female MRN: [id number] DOB: 1932/05/24 Height: 5' (152.4 cm) Weight: 57.607 kg (127 lb)              Insurance Information HMO: yes    PPO:      PCP:      IPA:      80/20:      OTHER:  PRIMARY: Blue Medicare      Policy#: [id number]      Subscriber: pt CM Name: Joeseph Amor      Phone#: [phone]     Fax#: [phone] Pre-Cert#: [id number]      Employer: retired. F/u after team on 11/11 to [employer]or at [phone] Benefits:  Phone #: [phone]     Name: 07/21/13 Eff. Date: 09/18/12     Deduct: none      Out of Pocket Max: $3400      Life Max: none CIR: $170 per day, days 1 through 7      SNF: no copay days 1 through 10; $50 copay per day days 11 through 100 Outpatient: $35 per visit     Co-Pay: no visit limit Home Health: 100%      Co-Pay: no visit limit DME: 80%     Co-Pay: 20% Providers: in network  SECONDARY: none      Emergency Contact Information Contact Information   Name Relation Home Work [place] [phone]  [phone]   [place],Delayne Daughter [phone]     Current Medical History  Patient Admitting Diagnosis:deconditioning d/t CAD/AS s/p AVR/CABG  History of Present Illness: [name] is a 79 y.o. right-handed female admitted 07/17/2013 refill nonsmoker admitted with progressive shortness of breath/chest pain and known history of aortic stenosis. Cardiac catheterization showed 70% stenosis in the mid LAD and LV systolic function fairly well preserved. Underwent aortic valve replacement/CABG x1 07/17/2013 per Dr. Zenaida Niece trigt. Postoperatively with bouts of confusion decreased awareness. Cranial CT scan showed no acute abnormalities.  Hypertension stable, MD increased Norvasc, continue Avapro, Beta Blocker. Plan to wean oxygen as tolerated, small effusion on left to monitor and continue IS. Volume status stable, continue  diuresis with some SOB on 07/23/13.    Past Medical History  Past Medical History  Diagnosis Date  . Hypertension   . Asthma     uses inhaler as needed  . Diabetes mellitus   . GERD (gastroesophageal reflux disease)   . Arthritis   . Anxiety   . Depression   . Hyperlipidemia   . Emphysema of lung   . Glaucoma   . [name]   . Aortic stenosis     Family History  family history includes Emphysema in her father. There is no history of Colon cancer, Esophageal cancer, Rectal cancer, or Stomach cancer.  Prior Rehab/Hospitalizations: none   Current Medications  Current facility-administered medications:0.45 % sodium chloride infusion, , Intravenous, Continuous, Wayne E Gold, PA-C, Last Rate: 20 mL/hr at 07/20/13 0800;  0.9 %  sodium chloride infusion, 250 mL, Intravenous, PRN, Kerin Perna, MD;  alum & mag hydroxide-simeth (MAALOX/MYLANTA) 200-200-20 MG/5ML suspension 15-30 mL, 15-30 mL, Oral, Q4H PRN, Kerin Perna, MD amLODipine (NORVASC) tablet 5 mg, 5 mg, Oral, Daily, Kerin Perna, MD, 5 mg at 07/23/13 0623;  aspirin chewable tablet 324 mg, 324 mg, Per Tube, Daily, Wayne E Gold, PA-C;  aspirin EC tablet 325 mg, 325 mg, Oral, Daily, Wayne E  Gold, PA-C, 325 mg at 07/23/13 0934;  bisacodyl (DULCOLAX) EC tablet 10 mg, 10 mg, Oral, Daily, Wayne E Gold, PA-C, 10 mg at 07/23/13 0941;  bisacodyl (DULCOLAX) suppository 10 mg, 10 mg, Rectal, Daily, Wayne E Gold, PA-C docusate sodium (COLACE) capsule 200 mg, 200 mg, Oral, Daily, Wayne E Gold, PA-C, 200 mg at 07/23/13 0934;  enoxaparin (LOVENOX) injection 30 mg, 30 mg, Subcutaneous, Q24H, Kerin Perna, MD, 30 mg at 07/22/13 0942;  fluticasone (FLOVENT HFA) 44 MCG/ACT inhaler 2 puff, 2 puff, Inhalation, Daily, Rowe Clack, PA-C, 2 puff at 07/24/13 4098;  food thickener (THICK IT) powder, , Oral, PRN, Kerin Perna, MD furosemide (LASIX) tablet 20 mg, 20 mg, Oral, Daily, Kerin Perna, MD, 20 mg at 07/22/13 0945;   hydrALAZINE (APRESOLINE) injection 10 mg, 10 mg, Intravenous, Q4H PRN, Kerin Perna, MD, 10 mg at 07/20/13 0853;  insulin aspart (novoLOG) injection 0-24 Units, 0-24 Units, Subcutaneous, TID AC & HS, Kerin Perna, MD, 2 Units at 07/24/13 0700 Ipratropium-Albuterol (COMBIVENT) respimat 2 puff, 2 puff, Inhalation, QID, Kerin Perna, MD, 2 puff at 07/24/13 0914;  irbesartan (AVAPRO) tablet 75 mg, 75 mg, Oral, Daily, Kerin Perna, MD, 75 mg at 07/23/13 0936;  latanoprost (XALATAN) 0.005 % ophthalmic solution 1 drop, 1 drop, Both Eyes, QHS, Kerin Perna, MD, 1 drop at 07/23/13 2130 levalbuterol (XOPENEX) nebulizer solution 1.25 mg, 1.25 mg, Nebulization, Q6H PRN, Kerin Perna, MD, 1.25 mg at 07/20/13 1822;  magnesium hydroxide (MILK OF MAGNESIA) suspension 30 mL, 30 mL, Oral, Daily PRN, Kerin Perna, MD;  metoprolol tartrate (LOPRESSOR) tablet 25 mg, 25 mg, Oral, BID, Erin Barrett, PA-C, 25 mg at 07/23/13 2119;  ondansetron (ZOFRAN) injection 4 mg, 4 mg, Intravenous, Q6H PRN, Wayne E Gold, PA-C, 4 mg at 07/18/13 0305 pantoprazole (PROTONIX) EC tablet 40 mg, 40 mg, Oral, Q1200, Kerin Perna, MD, 40 mg at 07/23/13 1147;  potassium chloride (K-DUR) CR tablet 10 mEq, 10 mEq, Oral, Daily, Kerin Perna, MD, 10 mEq at 07/23/13 0935;  sertraline (ZOLOFT) tablet 50 mg, 50 mg, Oral, q morning - 10a, Wayne E Gold, PA-C, 50 mg at 07/23/13 1191;  simvastatin (ZOCOR) tablet 10 mg, 10 mg, Oral, q1800, Wayne E Gold, PA-C, 10 mg at 07/23/13 1649 sodium chloride 0.9 % injection 3 mL, 3 mL, Intravenous, Q12H, Wayne E Gold, PA-C, 3 mL at 07/23/13 2127;  sodium chloride 0.9 % injection 3 mL, 3 mL, Intravenous, PRN, Wayne E Gold, PA-C, 3 mL at 07/18/13 2144;  sodium chloride 0.9 % injection 3 mL, 3 mL, Intravenous, Q12H, Kerin Perna, MD, 3 mL at 07/23/13 4782;  sodium chloride 0.9 % injection 3 mL, 3 mL, Intravenous, PRN, Kerin Perna, MD traMADol Janean Sark) tablet 50 mg, 50 mg, Oral, Q6H PRN, Kerin Perna, MD, 50 mg at 07/24/13 9562  Patients Current Diet: Dysphagia 3 diet with thin liquids  Precautions / Restrictions Precautions Precautions: Sternal;Fall Restrictions Weight Bearing Restrictions: No   Prior Activity Level Community (5-7x/wk): active and independent  Journalist, newspaper / Equipment Home Assistive Devices/Equipment: Dentures (specify type);Eyeglasses Home Equipment: Walker - 2 wheels;Grab bars - toilet;Grab bars - tub/shower  Prior Functional Level Prior Function Level of Independence: Independent Comments: per pt report she was independent  Current Functional Level Cognition  Overall Cognitive Status: Within Functional Limits for tasks assessed Current Attention Level: Selective Orientation Level: Oriented X4 Following Commands: Follows one step commands inconsistently;Follows one step commands with increased  time Safety/Judgement: Decreased awareness of safety;Decreased awareness of deficits General Comments: Pt is very distractable within herself and in a busy hallway environment and when she gets distracted and is on her feet she loses her balance more.      Extremity Assessment (includes Sensation/Coordination)          ADLs  Eating/Feeding: Supervision/safety Where Assessed - Eating/Feeding: Chair Grooming: Wash/dry hands;Wash/dry face;Brushing hair Where Assessed - Grooming: Supported sitting Upper Body Bathing: Minimal assistance Where Assessed - Upper Body Bathing: Supported sitting Lower Body Bathing: Maximal assistance Where Assessed - Lower Body Bathing: Supported sit to stand Upper Body Dressing: Moderate assistance Where Assessed - Upper Body Dressing: Unsupported sitting Lower Body Dressing: +1 Total assistance Where Assessed - Lower Body Dressing: Supported sit to Pharmacist, hospital: Moderate assistance Toilet Transfer Method: Stand pivot;Sit to Barista: Bedside commode Toileting - Clothing  Manipulation and Hygiene: Maximal assistance Where Assessed - Engineer, mining and Hygiene: Standing Transfers/Ambulation Related to ADLs: Pt requires mod A and max verbal cues for sequencing ADL Comments: Pt instructed in sternal precautions and how they impact BADLs    Mobility  Bed Mobility: Rolling Left;Left Sidelying to Sit;Sitting - Scoot to Edge of Bed Rolling Right: 1: +1 Total assist Rolling Left: 4: Min assist Right Sidelying to Sit: 1: +1 Total assist;HOB elevated Left Sidelying to Sit: 4: Min assist Sitting - Scoot to [place] of Bed: 4: Min assist    Transfers  Transfers: Sit to Stand;Stand to Sit Sit to Stand: 4: Min assist;From bed Stand to Sit: 4: Min guard;To bed Squat Pivot Transfers: 1: +1 Total assist;From elevated surface    Ambulation / Gait / Stairs / Wheelchair Mobility  Ambulation/Gait Ambulation/Gait Assistance: 4: Min Environmental consultant (Feet): 100 Feet Assistive device: Rolling walker Ambulation/Gait Assistance Details: Pt requires cues to stay close to RW, slow cadence with 3 x standing rest break due to DOE, spO2 90% on 2Lnc Gait Pattern: Step-through pattern;Shuffle;Lateral trunk lean to right Gait velocity: decreased General Gait Details: O2 sats remained stable on O2     Posture / Balance Static Sitting Balance Static Sitting - Balance Support: Feet supported Static Sitting - Level of Assistance: 4: Min assist Static Sitting - Comment/# of Minutes: min A progressing to min guard assist Static Standing Balance Static Standing - Balance Support: During functional activity;No upper extremity supported Static Standing - Level of Assistance: 4: Min assist Static Standing - Comment/# of Minutes: standing x 3 minutes for washing hands after toileting    Special needs/care consideration Oxygen Oxygen at 2 liters per nasal cannula. Does not have O2 pta at home Bowel mgmt: IBS, c/o of loose stools. Hold stool softeners Bladder  mgmt:continent Diabetic mgmt yes   Previous Home Environment Living Arrangements: Spouse/significant other  Lives With: Spouse Available Help at Discharge: Family;Available 24 hours/day Type of Home: House Home Layout: Multi-level;Laundry or work area in basement;Able to live on main level with bedroom/bathroom;Full bath on main level Home Access: Stairs to enter [employer] of Steps: 3 Bathroom Shower/Tub: Forensic scientist: Standard Bathroom Accessibility: Yes How Accessible: Accessible via walker Home Care Services: No Additional Comments: Pt reports husband is able to provide 24 hour assist at necessary  Discharge Living Setting Plans for Discharge Living Setting: Patient's home;Lives with (comment) (spouse) Type of Home at Discharge: House Discharge Home Layout: Multi-level;Laundry or work area in basement;Full bath on main level;Able to live on main level with bedroom/bathroom Discharge Home Access: Stairs to  enter Entrance Stairs-Rails: None Entrance Stairs-Number of Steps: 3 steps Discharge Bathroom Shower/Tub: Tub/shower unit;Curtain Discharge Bathroom Toilet: Standard Discharge Bathroom Accessibility: Yes How Accessible: Accessible via walker Does the patient have any problems obtaining your medications?: No  Social/Family/Support Systems Patient Roles: Spouse;Parent Contact Information: Zettie Cooley, spouse and Delayne, daughter Anticipated Caregiver: spouse, [name] Anticipated Industrial/product designer Information: see above Ability/Limitations of Caregiver: no limitations Caregiver Availability: 24/7 Discharge Plan Discussed with Primary Caregiver: Yes Is Caregiver In Agreement with Plan?: Yes Does Caregiver/Family have Issues with Lodging/Transportation while Pt is in Rehab?: No  Goals/Additional Needs Patient/Family Goal for Rehab: supervision to min assist with PT and OT Expected length of stay: ELOS 16 to 20 days Pt/Family Agrees  to Admission and willing to participate: Yes Program Orientation Provided & Reviewed with Pt/Caregiver Including Roles  & Responsibilities: Yes   Decrease burden of Care through IP rehab admission: n/a  Possible need for SNF placement upon discharge:not anticipated   Patient Condition: This patient's medical and functional status has changed since the consult dated: 07/21/13 in which the Rehabilitation Physician determined and documented that the patient's condition is appropriate for intensive rehabilitative care in an inpatient rehabilitation facility. See "History of Present Illness" (above) for medical update. Functional changes are: overall min to mod assist. Patient's medical and functional status update has been discussed with the Rehabilitation physician and patient remains appropriate for inpatient rehabilitation. Will admit to inpatient rehab today.  Preadmission Screen Completed By:  Clois Dupes, 07/24/2013 10:03 AM ______________________________________________________________________   Discussed status with Dr. Wynn Banker on 07/24/13 at  1002 and received telephone approval for admission today.  Admission Coordinator:  Clois Dupes, time 7829 Date 07/24/2013.

## 2013-07-25 ENCOUNTER — Inpatient Hospital Stay (HOSPITAL_COMMUNITY): Payer: Medicare Other | Admitting: Occupational Therapy

## 2013-07-25 ENCOUNTER — Inpatient Hospital Stay (HOSPITAL_COMMUNITY): Payer: Medicare Other

## 2013-07-25 DIAGNOSIS — R5381 Other malaise: Secondary | ICD-10-CM

## 2013-07-25 LAB — CBC WITH DIFFERENTIAL/PLATELET
Basophils Absolute: 0.1 10*3/uL (ref 0.0–0.1)
Basophils Relative: 1 % (ref 0–1)
Eosinophils Relative: 9 % — ABNORMAL HIGH (ref 0–5)
Lymphocytes Relative: 23 % (ref 12–46)
MCV: 91.2 fL (ref 78.0–100.0)
Monocytes Absolute: 1.2 10*3/uL — ABNORMAL HIGH (ref 0.1–1.0)
Neutro Abs: 5.2 10*3/uL (ref 1.7–7.7)
Neutrophils Relative %: 55 % (ref 43–77)
Platelets: 234 10*3/uL (ref 150–400)
RDW: 14.8 % (ref 11.5–15.5)
WBC: 10 10*3/uL (ref 4.0–10.5)

## 2013-07-25 LAB — COMPREHENSIVE METABOLIC PANEL
ALT: 27 U/L (ref 0–35)
AST: 28 U/L (ref 0–37)
CO2: 27 mEq/L (ref 19–32)
Calcium: 8.6 mg/dL (ref 8.4–10.5)
Chloride: 104 mEq/L (ref 96–112)
GFR calc non Af Amer: 35 mL/min — ABNORMAL LOW (ref >90)
Sodium: 142 mEq/L (ref 135–145)

## 2013-07-25 LAB — GLUCOSE, CAPILLARY
Glucose-Capillary: 121 mg/dL — ABNORMAL HIGH (ref 70–99)
Glucose-Capillary: 161 mg/dL — ABNORMAL HIGH (ref 70–99)
Glucose-Capillary: 108 mg/dL — ABNORMAL HIGH (ref 70–99)

## 2013-07-25 MED ORDER — FUROSEMIDE 20 MG PO TABS
20.0000 mg | ORAL_TABLET | Freq: Every day | ORAL | Status: DC
  Administered 2013-07-26 – 2013-07-29 (×4): 20 mg via ORAL
  Filled 2013-07-25 (×6): qty 1

## 2013-07-25 NOTE — Progress Notes (Signed)
Patient information reviewed and entered into eRehab system by Eller Sweis, RN, CRRN, PPS Coordinator.  Information including medical coding and functional independence measure will be reviewed and updated through discharge.     Per nursing patient was given "Data Collection Information Summary for Patients in Inpatient Rehabilitation Facilities with attached "Privacy Act Statement-Health Care Records" upon admission.  

## 2013-07-25 NOTE — IPOC Note (Addendum)
Overall Plan of Care [facility]) Patient Details Name: [name] MRN: [id number] DOB: 02-Sep-1932  Admitting Diagnosis: AVR, CABG Deconditioned   Hospital Problems: Principal Problem:   Physical deconditioning     Functional Problem List: Nursing Behavior;Bladder;Bowel;Endurance;Medication Management;Motor;Nutrition;Pain;Safety;Skin Integrity  PT Balance;Endurance;Pain;Skin Integrity;Safety  OT Balance;Edema;Endurance;Pain;Safety;Skin Integrity  SLP    TR         Basic ADL's: OT Bathing;Grooming;Dressing;Toileting     Advanced  ADL's: OT Simple Meal Preparation;Light Housekeeping     Transfers: PT Bed Mobility;Bed to Chair;Car;Furniture  OT Toilet;Tub/Shower     Locomotion: PT Ambulation;Stairs     Additional Impairments: OT None  SLP        TR      Anticipated Outcomes Item Anticipated Outcome  Self Feeding Independent  Swallowing      Basic self-care  Mod I  Toileting  Mod I   Bathroom Transfers Mod I  Bowel/Bladder  pt. will be continent to Bowel and bladder with mod. assisst.  Transfers  mod I  Locomotion  mod I household gait  Communication     Cognition     Pain  pain level 2.on scale 1-10  Safety/Judgment  pt. will remain safe with mod. assisst.   Therapy Plan: PT Intensity: Minimum of 1-2 x/day ,45 to 90 minutes PT Frequency: 5 out of 7 days PT Duration Estimated Length of Stay: 10-14 days OT Intensity: Minimum of 1-2 x/day, 45 to 90 minutes OT Frequency: 5 out of 7 days OT Duration/Estimated Length of Stay: 12-14 days         Team Interventions: Nursing Interventions Patient/Family Education;Bowel Management;Disease Management/Prevention;Medication Management;Skin Care/Wound Management;Discharge Planning  PT interventions Ambulation/gait training;Balance/vestibular training;Community reintegration;Discharge planning;Disease management/prevention;DME/adaptive equipment instruction;Functional mobility training;Neuromuscular  re-education;Pain management;Patient/family education;Psychosocial support;Skin care/wound management;Stair training;Therapeutic Activities;Therapeutic Exercise;UE/LE Strength taining/ROM;UE/LE Coordination activities;Wheelchair propulsion/positioning  OT Interventions Balance/vestibular training;Community reintegration;Discharge planning;DME/adaptive equipment instruction;Functional mobility training;Pain management;Patient/family education;Psychosocial support;Self Care/advanced ADL retraining;Skin care/wound managment;Therapeutic Activities;Therapeutic Exercise;UE/LE Strength taining/ROM;UE/LE Coordination activities  SLP Interventions    TR Interventions  recreation/leisure participation, therapeutic activities, community reintegration, pt/family education  SW/CM Interventions Discharge Planning;Psychosocial Support;Patient/Family Education    Team Discharge Planning: Destination: PT-Home ,OT- Home , SLP-  Projected Follow-up: PT-Outpatient PT, OT-  Home health OT, SLP-  Projected Equipment Needs: PT-Rolling walker with 5" wheels, OT- 3 in 1 bedside comode;Tub/shower bench, SLP-  Patient/family involved in discharge planning: PT- Patient,  OT-Patient, SLP-   MD ELOS: 12 days Medical Rehab Prognosis:  Excellent Assessment: The patient has been admitted for CIR therapies. The team will be addressing, functional mobility, strength, stamina, balance, safety, adaptive techniques/equipment, self-care, bowel and bladder mgt, patient and caregiver education, pain mgt, wound care, CV tolerance. Goals have been set at mod I across disciplines.    Ranelle Oyster, MD, FAAPMR      See Team Conference Notes for weekly updates to the plan of care

## 2013-07-25 NOTE — Progress Notes (Signed)
Subjective/Complaints: Had a good night for the most part. Gets a little sob when flat in bed---needs head elevated  Objective: Vital Signs: Blood pressure 122/66, pulse 64, temperature 97.8 F (36.6 C), temperature source Oral, resp. rate 16, height 5' (1.524 m), weight 55.702 kg (122 lb 12.8 oz), SpO2 97.00%. Dg Chest 2 View  07/24/2013   CLINICAL DATA:  Dyspnea, weakness, and known pleural effusions  EXAM: CHEST  2 VIEW  COMPARISON:  Jan 20, 2013  FINDINGS: There is a moderate sized left-sided pleural effusion which has slightly decreased in size since yesterday's study. On the right there is a trace of pleural fluid blunting the costophrenic gutters. This is slightly less conspicuous today. The cardiac silhouette is normal in size. The pulmonary vascularity is not engorged. There is no pneumothorax nor pneumomediastinum. The patient has undergone previous median sternotomy and aortic valve replacement. The observed portions of the bony thorax exhibit no acute abnormalities. There is multi level degenerative disc space narrowing of the thoracic spine.  IMPRESSION: There are persistent bilateral pleural effusions greater on the left than on the right but these have slightly decreased in size since yesterday's study. There is no focal pneumonia nor overt evidence of CHF.   Electronically Signed   By: David  [place]   On: 07/24/2013 11:19    Recent Labs  07/24/13 1320 07/25/13 0520  WBC 10.3 10.0  HGB 9.6* 8.5*  HCT 29.1* 26.9*  PLT 252 234    Recent Labs  07/23/13 0415 07/24/13 1320 07/25/13 0520  NA 142  --  142  K 4.0  --  4.1  CL 106  --  104  GLUCOSE 133*  --  117*  BUN 38*  --  40*  CREATININE 1.14* 1.30* 1.41*  CALCIUM 8.6  --  8.6   CBG (last 3)   Recent Labs  07/24/13 1636 07/24/13 2114 07/25/13 0702  GLUCAP 149* 132* 139*    Wt Readings from Last 3 Encounters:  07/25/13 55.702 kg (122 lb 12.8 oz)  07/24/13 57.607 kg (127 lb)  07/24/13 57.607 kg (127 lb)     Physical Exam:  Constitutional: Patient was able to provide her name, place and date of birth. She had some difficulty recalling her full hospital course .She followed simple commands  Frail appearing.  HENT:  Head: Normocephalic.  Eyes: EOM are normal.  Neck: Normal range of motion. Neck supple. No thyromegaly present.  Cardiovascular:  Cardiac rate controlled with murmur Respiratory:  Decreased breath sounds at the left base more than right. Non-distressed, appears comfortable GI: Soft. Bowel sounds are normal. She exhibits no distension.  Neurological: She is alert.  No CN deficits. Generalized proximal greater than distal weakness. 3 -->>4/5. No sensory findings. dtr's 2+ at bilateral patellar and Achilles.  Skin:  Midline chest incision clean and dry  motor strength is 5/5 in the right upper, 4/5 in the left biceps otherwise 5/5  4/5 bilateral hip flexors knee extensors ankle dorsiflexors and plantar flexors  Sensation intact to light touch in bilateral upper and lower limbs   Assessment/Plan: 1. Functional deficits secondary to deconditioning due to AS and multiple medical,  AVR/CABG which require 3+ hours per day of interdisciplinary therapy in a comprehensive inpatient rehab setting. Physiatrist is providing close team supervision and 24 hour management of active medical problems listed below. Physiatrist and rehab team continue to assess barriers to discharge/monitor patient progress toward functional and medical goals. FIM:  Comprehension Comprehension Mode: Auditory Comprehension: 5-Follows basic conversation/direction: With no assist  Expression Expression Mode: Verbal Expression: 7-Expresses complex ideas: With no assist  Social Interaction Social Interaction: 7-Interacts appropriately with others - No medications needed.  Problem Solving Problem Solving: 5-Solves basic problems: With no assist  Memory Memory: 6-More than  reasonable amt of time  Medical Problem List and Plan:  1. Deconditioning CAD/aortic stenosis/status post AVR/CABG 07/17/2013  2. DVT Prophylaxis/Anticoagulation: Subcutaneous Lovenox. Monitor platelet counts and any signs of bleeding  3. Pain Management: Ultram 50 mg every 6 hours as needed. Monitor with increased activity  4. Mood/depression. Zoloft 50 mg daily. Provide emotional support  5. Neuropsych: This patient is capable of making decisions on her own behalf.  6. Acute blood loss anemia. hgb 8.5 7. Hypertension. Norvasc 5 mg daily, Lasix 20 mg daily, Avapro 75 mg daily and Lopressor 25 mg twice a day. Monitor with increased activity  8. History of asthma. Continue inhalers as directed. Check oxygen saturations every shift.  9. GERD. Protonix  10. Hyperlipidemia. Zocor  11. Diabetes mellitus with peripheral neuropathy. Hemoglobin A1c 6.6. Resume Glucophage as tolerated as prior to admission.  12. Left pleural effusion will monitor for increasing shortness of breath. 13. Renal insufficiency: bun/cr slowly drifting up--hold lasix today. Recheck bmet saturday.  LOS (Days) 1 A FACE TO FACE EVALUATION WAS PERFORMED  Amirah Goerke T 07/25/2013 7:50 AM

## 2013-07-25 NOTE — Evaluation (Signed)
Physical Therapy Assessment and Plan  Patient Details  Name: [name] MRN: [id number] Date of Birth: 05-07-1932  PT Diagnosis: Abnormality of gait, Difficulty walking, Low back pain, Muscle weakness and Pain in incision site/sternum Rehab Potential: Good ELOS: 10-14 days   Today's Date: 07/25/2013 Time: 0930-1028 Time Calculation (min): 58 min  Problem List:  Patient Active Problem List   Diagnosis Date Noted  . Physical deconditioning 07/24/2013  . S/P AVR (aortic valve replacement) 07/23/2013  . S/P CABG x 1 07/23/2013  . Aortic stenosis 06/10/2013  . Dyspnea 06/10/2013  . Essential hypertension 06/10/2013  . Hyperlipidemia 06/10/2013  . COPD (chronic obstructive pulmonary disease) 10/26/2011    Past Medical History:  Past Medical History  Diagnosis Date  . Hypertension   . Asthma     uses inhaler as needed  . Diabetes mellitus   . GERD (gastroesophageal reflux disease)   . Arthritis   . Anxiety   . Depression   . Hyperlipidemia   . Emphysema of lung   . Glaucoma   . [name]   . Aortic stenosis    Past Surgical History:  Past Surgical History  Procedure Laterality Date  . Ankle surgery  1998    d/t fx.  Left ankle  . Facial cosmetic surgery      face lift  . Nose surgery    . Laparoscopic assisted vaginal hysterectomy  06/21/2011    Procedure: LAPAROSCOPIC ASSISTED VAGINAL HYSTERECTOMY;  Surgeon: Freddy Finner;  Location: [facility];  Service: Gynecology;  Laterality: N/A;  . Salpingoophorectomy  06/21/2011    Procedure: SALPINGO OOPHERECTOMY;  Surgeon: Freddy Finner;  Location: [facility];  Service: Gynecology;  Laterality: Bilateral;  . Anterior and posterior repair  06/21/2011    Procedure: ANTERIOR (CYSTOCELE) AND POSTERIOR REPAIR (RECTOCELE);  Surgeon: Freddy Finner;  Location: [facility];  Service: Gynecology;  Laterality: N/A;  . Vaginal prolapse repair  06/21/2011    Procedure: SACROPEXY;  Surgeon: Freddy Finner;  Location: [facility];   Service: Gynecology;  Laterality: N/A;  sacrospinous ligament suspension  . Cardiac catheterization    . Aortic valve replacement N/A 07/17/2013    Procedure: AORTIC VALVE REPLACEMENT (AVR);  Surgeon: Kerin Perna, MD;  Location: [facility] OR;  Service: Open Heart Surgery;  Laterality: N/A;  . Coronary artery bypass graft N/A 07/17/2013    Procedure: CORONARY ARTERY BYPASS GRAFT, TIMES ONE, ON PUMP, USING RIGHT INTERNAL MAMMARY ARTERY.;  Surgeon: Kerin Perna, MD;  Location: [facility] OR;  Service: Open Heart Surgery;  Laterality: N/A;  . Intraoperative transesophageal echocardiogram N/A 07/17/2013    Procedure: INTRAOPERATIVE TRANSESOPHAGEAL ECHOCARDIOGRAM;  Surgeon: Kerin Perna, MD;  Location: [facility] OR;  Service: Open Heart Surgery;  Laterality: N/A;  . Abdominal hysterectomy    . Cardiac valve replacement    . Coronary angioplasty      Assessment & Plan Clinical Impression: Patient is a 79 y.o. year old right-handed female admitted 07/17/2013 with progressive shortness of breath/chest pain and known history of aortic stenosis. Cardiac catheterization showed 70% stenosis in the mid LAD and LV systolic function fairly well preserved. Underwent aortic valve replacement/CABG x1 07/17/2013 per Dr. Zenaida Niece trigt and placed on aspirin therapy. Subcutaneous Lovenox initiated 07/22/2013 for DVT prophylaxis. Postoperatively with bouts of confusion decreased awareness. Cranial CT scan showed no acute abnormalities. Acute blood loss anemia 8.5-9.1 and monitored. Followup speech therapy for questionable dysphagia diet since been advanced to mechanical soft with thin liquids and tolerating  nicely. Sternal precautions implemented after CABG. Physical and occupational therapy evaluations completed 07/19/2013 with recommendations for physical medicine rehabilitation consult to consider inpatient rehabilitation services. Patient transferred to CIR on 07/24/2013 .   Patient currently requires min with mobility secondary to  muscle weakness, decreased cardiorespiratoy endurance and decreased oxygen support and decreased standing balance, decreased postural control, decreased balance strategies and difficulty maintaining precautions.  Prior to hospitalization, patient was independent  with mobility and lived with Spouse in a House home.  Home access is 3-4Stairs to enter.  Patient will benefit from skilled PT intervention to maximize safe functional mobility, minimize fall risk and decrease caregiver burden for planned discharge home with 24 hour supervision.  Anticipate patient will benefit from follow up OP at discharge.  PT - End of Session Activity Tolerance: Decreased this session;Tolerates 30+ min activity with multiple rests Endurance Deficit: Yes Endurance Deficit Description: 3 L O2 and requires multiple rest breaks and small bouts of activity PT Assessment Rehab Potential: Good PT Patient demonstrates impairments in the following area(s): Balance;Endurance;Pain;Skin Integrity;Safety PT Transfers Functional Problem(s): Bed Mobility;Bed to Chair;Car;Furniture PT Locomotion Functional Problem(s): Ambulation;Stairs PT Plan PT Intensity: Minimum of 1-2 x/day ,45 to 90 minutes PT Frequency: 5 out of 7 days PT Duration Estimated Length of Stay: 10-14 days PT Treatment/Interventions: Ambulation/gait training;Balance/vestibular training;Community reintegration;Discharge planning;Disease management/prevention;DME/adaptive equipment instruction;Functional mobility training;Neuromuscular re-education;Pain management;Patient/family education;Psychosocial support;Skin care/wound management;Stair training;Therapeutic Activities;Therapeutic Exercise;UE/LE Strength taining/ROM;UE/LE Coordination activities;Wheelchair propulsion/positioning PT Transfers Anticipated Outcome(s): mod I PT Locomotion Anticipated Outcome(s): mod I household gait PT Recommendation Follow Up Recommendations: Outpatient PT Patient destination:  Home Equipment Recommended: Rolling walker with 5" wheels  Skilled Therapeutic Intervention Individual treatment initiated with focus on functional transfers, overall endurance and activity tolerance, gait training with and without AD, stair negotiation for simulated home entry, and toilet transfers. Pt presents with decreased balance and very poor activity tolerance requiring frequent rest breaks. Cues needed for sternal precautions during functional mobility.   PT Evaluation Precautions/Restrictions Precautions Precautions: Sternal;Fall Precaution Comments: cues for precautions functionally Restrictions Weight Bearing Restrictions: No Vital Signs O2 on 3L via [place] = 93% with activity HR = 79 bpm with activity Pain Pain Assessment Pain Assessment: 0-10 Pain Score: 6  Pain Type: Acute pain Pain Location: Back Pain Orientation: Lower Pain Descriptors / Indicators: Discomfort Pain Onset: Gradual Pain Intervention(s): RN made aware;Repositioned Home Living/Prior Functioning Home Living Available Help at Discharge: Family;Available 24 hours/day Type of Home: House Home Access: Stairs to enter [employer] of Steps: 3-4 Entrance Stairs-Rails: None (recommend railings - pt in agreement) Home Layout: Able to live on main level with bedroom/bathroom;Multi-level  Lives With: Spouse Prior Function Level of Independence: Independent with basic ADLs;Independent with gait;Independent with transfers  Able to Take Stairs?: Yes Driving: Yes Vision/Perception  Vision - History Baseline Vision: Wears glasses all the time Patient Visual Report: No change from baseline Vision - Assessment Eye Alignment: Within Functional Limits Perception Perception: Within Functional Limits Praxis Praxis: Intact  Cognition Overall Cognitive Status: Within Functional Limits for tasks assessed Arousal/Alertness: Awake/alert Orientation Level: Oriented X4 Memory: Appears intact Awareness:  Appears intact Problem Solving: Appears intact Safety/Judgment: Appears intact Sensation Sensation Light Touch: Appears Intact Additional Comments: BUEs appear intact Coordination Gross Motor Movements are Fluid and Coordinated: Yes Fine Motor Movements are Fluid and Coordinated: Yes Motor  Motor Motor: Within Functional Limits  Locomotion  Ambulation Ambulation/Gait Assistance: 4: Min assist  Trunk/Postural Assessment  Cervical Assessment Cervical Assessment: Within Functional Limits Thoracic Assessment Thoracic Assessment: Within Functional Limits  Lumbar Assessment Lumbar Assessment: Within Functional Limits Postural Control Postural Control: Deficits on evaluation  Balance Balance Balance Assessed: Yes Berg Balance Test Sit to Stand: Able to stand using hands after several tries Standing Unsupported: Needs several tries to stand 30 seconds unsupported (35 seconds) Sitting with Back Unsupported but Feet Supported on Floor or Stool: Able to sit 2 minutes under supervision Stand to Sit: Uses backs of legs against chair to control descent Transfers: Needs one person to assist Standing Unsupported with Eyes Closed: Able to stand 10 seconds with supervision Standing Ubsupported with Feet Together: Able to place feet together independently and stand for 1 minute with supervision From Standing, Reach Forward with Outstretched Arm: Reaches forward but needs supervision From Standing Position, Pick up Object from Floor: Able to pick up shoe, needs supervision From Standing Position, Turn to Look Behind Over each Shoulder: Turn sideways only but maintains balance Turn 360 Degrees: Needs assistance while turning (steady A) Standing Unsupported, Alternately Place Feet on Step/Stool: Able to complete >2 steps/needs minimal assist Standing Unsupported, One Foot in Front: Loses balance while stepping or standing Standing on One Leg: Unable to try or needs assist to prevent fall Total  Score: 22 Static Sitting Balance Static Sitting - Level of Assistance: 5: Stand by assistance Dynamic Sitting Balance Dynamic Sitting - Level of Assistance: 5: Stand by assistance Static Standing Balance Static Standing - Level of Assistance: 4: Min assist Dynamic Standing Balance Dynamic Standing - Level of Assistance: 4: Min assist Extremity Assessment  RUE Assessment RUE Assessment: Within Functional Limits LUE Assessment LUE Assessment: Within Functional Limits RLE Assessment RLE Assessment: Within Functional Limits (decreased muscular endurance; grossly 4/5) LLE Assessment LLE Assessment: Within Functional Limits (decreased muscular endurance; grossly 4/5)  FIM:  FIM - Banker Devices: Bed rails Bed/Chair Transfer: 4: Supine > Sit: Min A (steadying Pt. > 75%/lift 1 leg);5: Sit > Supine: Supervision (verbal cues/safety issues);4: Bed > Chair or W/C: Min A (steadying Pt. > 75%);4: Chair or W/C > Bed: Min A (steadying Pt. > 75%) FIM - Locomotion: Wheelchair Locomotion: Wheelchair: 1: Total Assistance/staff pushes wheelchair (Pt<25%) FIM - Locomotion: Ambulation Ambulation/Gait Assistance: 4: Min assist Locomotion: Ambulation: 1: Travels less than 50 ft with minimal assistance (Pt.>75%) FIM - Locomotion: Stairs Locomotion: Building control surveyor: Hand rail - 2 Locomotion: Stairs: 1: Up and Down < 4 stairs with minimal assistance (Pt.>75%)   Refer to Care Plan for Long Term Goals  Recommendations for other services: None  Discharge Criteria: Patient will be discharged from PT if patient refuses treatment 3 consecutive times without medical reason, if treatment goals not met, if there is a change in medical status, if patient makes no progress towards goals or if patient is discharged from hospital.  The above assessment, treatment plan, treatment alternatives and goals were discussed and mutually agreed upon: by patient    Session  #2: Time: 1100-1126 Time Calculation: 26 min Individual therapy; Denies pain just reports feeling fatigued/winded. Administered Berg balance test and discussed results with patient who verbalized understanding and in agreement of recommendations for use of RW at this time. See results above under evaluation for details. Repositioned in bed with all needs in place.   [name] [facility] 07/25/2013, 12:04 PM

## 2013-07-25 NOTE — Progress Notes (Signed)
Social Work  Social Work Assessment and Plan  Patient Details  Name: [name] MRN: [id number] Date of Birth: June 14, 1932  Today's Date: 07/25/2013  Problem List:  Patient Active Problem List   Diagnosis Date Noted  . Physical deconditioning 07/24/2013  . S/P AVR (aortic valve replacement) 07/23/2013  . S/P CABG x 1 07/23/2013  . Aortic stenosis 06/10/2013  . Dyspnea 06/10/2013  . Essential hypertension 06/10/2013  . Hyperlipidemia 06/10/2013  . COPD (chronic obstructive pulmonary disease) 10/26/2011   Past Medical History:  Past Medical History  Diagnosis Date  . Hypertension   . Asthma     uses inhaler as needed  . Diabetes mellitus   . GERD (gastroesophageal reflux disease)   . Arthritis   . Anxiety   . Depression   . Hyperlipidemia   . Emphysema of lung   . Glaucoma   . [name]   . Aortic stenosis    Past Surgical History:  Past Surgical History  Procedure Laterality Date  . Ankle surgery  1998    d/t fx.  Left ankle  . Facial cosmetic surgery      face lift  . Nose surgery    . Laparoscopic assisted vaginal hysterectomy  06/21/2011    Procedure: LAPAROSCOPIC ASSISTED VAGINAL HYSTERECTOMY;  Surgeon: Freddy Finner;  Location: [facility];  Service: Gynecology;  Laterality: N/A;  . Salpingoophorectomy  06/21/2011    Procedure: SALPINGO OOPHERECTOMY;  Surgeon: Freddy Finner;  Location: [facility];  Service: Gynecology;  Laterality: Bilateral;  . Anterior and posterior repair  06/21/2011    Procedure: ANTERIOR (CYSTOCELE) AND POSTERIOR REPAIR (RECTOCELE);  Surgeon: Freddy Finner;  Location: [facility];  Service: Gynecology;  Laterality: N/A;  . Vaginal prolapse repair  06/21/2011    Procedure: SACROPEXY;  Surgeon: Freddy Finner;  Location: [facility];  Service: Gynecology;  Laterality: N/A;  sacrospinous ligament suspension  . Cardiac catheterization    . Aortic valve replacement N/A 07/17/2013    Procedure: AORTIC VALVE REPLACEMENT (AVR);  Surgeon: Kerin Perna, MD;  Location: [facility] OR;  Service: Open Heart Surgery;  Laterality: N/A;  . Coronary artery bypass graft N/A 07/17/2013    Procedure: CORONARY ARTERY BYPASS GRAFT, TIMES ONE, ON PUMP, USING RIGHT INTERNAL MAMMARY ARTERY.;  Surgeon: Kerin Perna, MD;  Location: [facility] OR;  Service: Open Heart Surgery;  Laterality: N/A;  . Intraoperative transesophageal echocardiogram N/A 07/17/2013    Procedure: INTRAOPERATIVE TRANSESOPHAGEAL ECHOCARDIOGRAM;  Surgeon: Kerin Perna, MD;  Location: [facility] OR;  Service: Open Heart Surgery;  Laterality: N/A;  . Abdominal hysterectomy    . Cardiac valve replacement    . Coronary angioplasty     Social History:  reports that she quit smoking about 40 years ago. Her smoking use included Cigarettes. She has a 30 pack-year smoking history. She has never used smokeless tobacco. She reports that she does not drink alcohol or use illicit drugs.  Family / Support Systems Marital Status: Married How Long?: 25 yrs (2nd marriage for both) Patient Roles: Spouse;Parent Spouse/Significant Other: husband, [name], @ (H) [phone] or (C) [phone] Children: pt's daughter, [name] @ (H) [phone], (W) [phone] or (C) [phone] - local Other Supports: husband also has two adult daughters who are also local Anticipated Caregiver: spouse, [name] Ability/Limitations of Caregiver: no limitations - husband is retired Medical laboratory scientific officer: 24/7 Family Dynamics: pt describes husband as very supportive - no concerns about assistance available at home.  Social History Preferred language: [place]  Religion: Non-Denominational Cultural Background: NA Education: HS Read: Yes Write: Yes Employment Status: Retired Fish farm manager Issues: none Guardian/Conservator: none - pt capable of making decision on her own behalf   Abuse/Neglect Physical Abuse: Denies Verbal Abuse: Denies Sexual Abuse: Denies Exploitation of patient/patient's resources:  Denies Self-Neglect: Denies  Emotional Status Pt's affect, behavior adn adjustment status: Pt pleasant, oriented and able to complete interview easily, however, SOB throughout.  (on O2)  Denies any significant emotional distress and no s/s of depression or anxiety - will monitor.  Hopeful in regards to longer term recovery from this surgery. Recent Psychosocial Issues: None Pyschiatric History: pt notes she has taken medications for depression for several years, however, no formal diagnosis or treatment  Patient / Family Perceptions, Expectations & Goals Pt/Family understanding of illness & functional limitations: pt and husband with good understanding of CAD and need for Cardiac surgeries as well as need for CIR for deconditioning Premorbid pt/family roles/activities: Pt was independent PTA.  Not using O2 at home.  Active home and community Anticipated changes in roles/activities/participation: husband may need to offer some minimal assistance Pt/family expectations/goals: "I need to just get a lot stronger"  [employer]: None Premorbid Home Care/DME Agencies: None Transportation available at discharge: yes  Discharge Planning Living Arrangements: Spouse/significant other Support Systems: Spouse/significant other;Children Type of Residence: Private residence Insurance Resources: [employer] Surveyor, quantity Resources: Restaurant manager, fast food Screen Referred: No Living Expenses: Own Money Management: Spouse;Patient Does the patient have any problems obtaining your medications?: No Home Management: pt and husband Patient/Family Preliminary Plans: pt to return home with husband as primary care and intermittent assist from daughter Social Work Anticipated Follow Up Needs: HH/OP Expected length of stay: 10-14 days  Clinical Impression Very pleasant woman here following cardiac surgeries and very deconditioned.  Very good family support. No concerns  about managing at home once some strength is recovered.  No emotional distress noted - will monitor.  [name] 07/25/2013, 2:20 PM

## 2013-07-25 NOTE — Progress Notes (Signed)
[facility] Individual Statement of Services  Patient Name:  [name]  Date:  07/25/2013  Welcome to the [facility].  Our goal is to provide you with an individualized program based on your diagnosis and situation, designed to meet your specific needs.  With this comprehensive rehabilitation program, you will be expected to participate in at least 3 hours of rehabilitation therapies Monday-Friday, with modified therapy programming on the weekends.  Your rehabilitation program will include the following services:  Physical Therapy (PT), Occupational Therapy (OT), 24 hour per day rehabilitation nursing, Case Management (Social Worker), Rehabilitation Medicine, Nutrition Services and Pharmacy Services  Weekly team conferences will be held on Tuesdays to discuss your progress.  Your Social Worker will talk with you frequently to get your input and to update you on team discussions.  Team conferences with you and your family in attendance may also be held.  Expected length of stay: 10-14 days  Overall anticipated outcome: modified independent  Depending on your progress and recovery, your program may change. Your Social Worker will coordinate services and will keep you informed of any changes. Your Social Worker's name and contact numbers are listed  below.  The following services may also be recommended but are not provided by the [facility]:   Driving Evaluations  Home Health Rehabiltiation Services  Outpatient Rehabilitation Services    Arrangements will be made to provide these services after discharge if needed.  Arrangements include referral to agencies that provide these services.  Your insurance has been verified to be:  [employer] Your primary doctor is:  Dr. Laurann Montana  Pertinent information will be shared with your doctor and your insurance company.  Social Worker:  [place], [place] [phone] or ([phone]   Information discussed with and copy given to patient by: Amada Jupiter, 07/25/2013, 11:29 AM

## 2013-07-25 NOTE — Evaluation (Signed)
Occupational Therapy Assessment and Plan & Session Notes  Patient Details  Name: [name] MRN: [id number] Date of Birth: 07-Jun-1932  OT Diagnosis: acute pain and muscle weakness (generalized) Rehab Potential: Good ELOS: 12-14 Days  Today's Date: 07/25/2013  Problem List:  Patient Active Problem List   Diagnosis Date Noted  . Physical deconditioning 07/24/2013  . S/P AVR (aortic valve replacement) 07/23/2013  . S/P CABG x 1 07/23/2013  . Aortic stenosis 06/10/2013  . Dyspnea 06/10/2013  . Essential hypertension 06/10/2013  . Hyperlipidemia 06/10/2013  . COPD (chronic obstructive pulmonary disease) 10/26/2011    Past Medical History:  Past Medical History  Diagnosis Date  . Hypertension   . Asthma     uses inhaler as needed  . Diabetes mellitus   . GERD (gastroesophageal reflux disease)   . Arthritis   . Anxiety   . Depression   . Hyperlipidemia   . Emphysema of lung   . Glaucoma   . [name]   . Aortic stenosis    Past Surgical History:  Past Surgical History  Procedure Laterality Date  . Ankle surgery  1998    d/t fx.  Left ankle  . Facial cosmetic surgery      face lift  . Nose surgery    . Laparoscopic assisted vaginal hysterectomy  06/21/2011    Procedure: LAPAROSCOPIC ASSISTED VAGINAL HYSTERECTOMY;  Surgeon: Freddy Finner;  Location: [facility];  Service: Gynecology;  Laterality: N/A;  . Salpingoophorectomy  06/21/2011    Procedure: SALPINGO OOPHERECTOMY;  Surgeon: Freddy Finner;  Location: [facility];  Service: Gynecology;  Laterality: Bilateral;  . Anterior and posterior repair  06/21/2011    Procedure: ANTERIOR (CYSTOCELE) AND POSTERIOR REPAIR (RECTOCELE);  Surgeon: Freddy Finner;  Location: [facility];  Service: Gynecology;  Laterality: N/A;  . Vaginal prolapse repair  06/21/2011    Procedure: SACROPEXY;  Surgeon: Freddy Finner;  Location: [facility];  Service: Gynecology;  Laterality: N/A;  sacrospinous ligament suspension  . Cardiac  catheterization    . Aortic valve replacement N/A 07/17/2013    Procedure: AORTIC VALVE REPLACEMENT (AVR);  Surgeon: Kerin Perna, MD;  Location: [facility] OR;  Service: Open Heart Surgery;  Laterality: N/A;  . Coronary artery bypass graft N/A 07/17/2013    Procedure: CORONARY ARTERY BYPASS GRAFT, TIMES ONE, ON PUMP, USING RIGHT INTERNAL MAMMARY ARTERY.;  Surgeon: Kerin Perna, MD;  Location: [facility] OR;  Service: Open Heart Surgery;  Laterality: N/A;  . Intraoperative transesophageal echocardiogram N/A 07/17/2013    Procedure: INTRAOPERATIVE TRANSESOPHAGEAL ECHOCARDIOGRAM;  Surgeon: Kerin Perna, MD;  Location: [facility] OR;  Service: Open Heart Surgery;  Laterality: N/A;  . Abdominal hysterectomy    . Cardiac valve replacement    . Coronary angioplasty      Clinical Impression: [name] is a 79 y.o. right-handed female admitted 07/17/2013 with progressive shortness of breath/chest pain and known history of aortic stenosis. Cardiac catheterization showed 70% stenosis in the mid LAD and LV systolic function fairly well preserved. Underwent aortic valve replacement/CABG x1 07/17/2013 per Dr. Zenaida Niece Strong and placed on aspirin therapy. Subcutaneous Lovenox initiated 07/22/2013 for DVT prophylaxis. Postoperatively with bouts of confusion decreased awareness. Cranial CT scan showed no acute abnormalities. Acute blood loss anemia 8.5-9.1 and monitored. Followup speech therapy for questionable dysphagia diet since been advanced to mechanical soft with thin liquids and tolerating nicely. Sternal precautions implemented after CABG. Physical and occupational therapy evaluations completed 07/19/2013 with recommendations for physical  medicine rehabilitation consult to consider inpatient rehabilitation services. Patient transferred to CIR on 07/24/2013 .    Patient currently requires min with basic self-care skills secondary to muscle weakness, decreased cardiorespiratoy endurance and decreased oxygen support and  decreased standing balance, decreased postural control, decreased balance strategies and difficulty maintaining precautions.  Prior to hospitalization, patient could complete ADLs and IADLs independently.  Patient will benefit from skilled intervention to increase independence with basic self-care skills prior to discharge home with care partner.  Anticipate patient will require intermittent supervision and additional OT is TBD.  OT - End of Session Activity Tolerance: Tolerates 10 - 20 min activity with multiple rests Endurance Deficit: Yes Endurance Deficit Description: Patient on 3 liters of 02 and presents deconditioned. Patient requires multiple rest breaks throughout OT ADL evaluation. OT Assessment Rehab Potential: Good Barriers to Discharge:  (none known at this time) OT Patient demonstrates impairments in the following area(s): Balance;Edema;Endurance;Pain;Safety;Skin Integrity OT Basic ADL's Functional Problem(s): Bathing;Grooming;Dressing;Toileting OT Advanced ADL's Functional Problem(s): Simple Meal Preparation;Light Housekeeping OT Transfers Functional Problem(s): Toilet;Tub/Shower OT Additional Impairment(s): None OT Plan OT Intensity: Minimum of 1-2 x/day, 45 to 90 minutes OT Frequency: 5 out of 7 days OT Duration/Estimated Length of Stay: 12-14 days OT Treatment/Interventions: Metallurgist training;Community reintegration;Discharge planning;DME/adaptive equipment instruction;Functional mobility training;Pain management;Patient/family education;Psychosocial support;Self Care/advanced ADL retraining;Skin care/wound managment;Therapeutic Activities;Therapeutic Exercise;UE/LE Strength taining/ROM;UE/LE Coordination activities OT Self Feeding Anticipated Outcome(s): Independent OT Basic Self-Care Anticipated Outcome(s): Mod I OT Toileting Anticipated Outcome(s): Mod I OT Bathroom Transfers Anticipated Outcome(s): Mod I OT Recommendation Recommendations for Other Services:   (none at this time) Patient destination: Home Follow Up Recommendations: Home health OT Equipment Recommended: 3 in 1 bedside comode;Tub/shower bench  Precautions/Restrictions  Precautions Precautions: Sternal;Fall Restrictions Weight Bearing Restrictions: No  General Chart Reviewed: Yes Family/Caregiver Present: No  Home Living/Prior Functioning Home Living Family/patient expects to be discharged to:: Private residence Living Arrangements: Spouse/significant other Available Help at Discharge: Family;Available 24 hours/day Type of Home: House Home Access: Stairs to enter [employer] of Steps: 3-4 Home Layout: Able to live on main level with bedroom/bathroom;Multi-level  Lives With: Spouse IADL History Homemaking Responsibilities: Yes Meal Prep Responsibility: Primary Laundry Responsibility: Primary Cleaning Responsibility: Primary Shopping Responsibility: Secondary Current License: Yes Occupation: Retired Type of Occupation: Patient retired from being a Diplomatic Services operational officer for [employer] and Hobbies: Go to the movies, enjoys reading Prior Function Level of Independence: Independent with basic ADLs;Independent with gait;Independent with transfers  Able to Take Stairs?: Yes Driving: Yes  ADL - See FIM  Vision/Perception  Vision - History Baseline Vision: Wears glasses all the time Patient Visual Report: No change from baseline Vision - Assessment Eye Alignment: Within Functional Limits Perception Perception: Within Functional Limits Praxis Praxis: Intact   Cognition Overall Cognitive Status: Within Functional Limits for tasks assessed Arousal/Alertness: Awake/alert Orientation Level: Oriented X4 Memory: Appears intact Awareness: Appears intact Problem Solving: Appears intact Safety/Judgment: Appears intact  Sensation Sensation Additional Comments: BUEs appear intact Coordination Gross Motor Movements are Fluid and Coordinated: Yes Fine Motor  Movements are Fluid and Coordinated: Yes  Motor  Motor Motor: Within Functional Limits  Trunk/Postural Assessment  Cervical Assessment Cervical Assessment: Within Functional Limits Thoracic Assessment Thoracic Assessment: Within Functional Limits Lumbar Assessment Lumbar Assessment: Within Functional Limits   Balance Balance Balance Assessed: Yes Berg Balance Test Sit to Stand: Able to stand using hands after several tries Standing Unsupported: Needs several tries to stand 30 seconds unsupported Sitting with Back Unsupported but Feet Supported on Floor or Stool: Able  to sit 2 minutes under supervision Stand to Sit: Uses backs of legs against chair to control descent Transfers: Needs one person to assist Standing Unsupported with Eyes Closed: Able to stand 10 seconds with supervision Standing Ubsupported with Feet Together: Able to place feet together independently and stand for 1 minute with supervision From Standing, Reach Forward with Outstretched Arm: Reaches forward but needs supervision From Standing Position, Pick up Object from Floor: Able to pick up shoe, needs supervision From Standing Position, Turn to Look Behind Over each Shoulder: Turn sideways only but maintains balance Turn 360 Degrees: Needs assistance while turning Standing Unsupported, Alternately Place Feet on Step/Stool: Able to complete >2 steps/needs minimal assist Standing Unsupported, One Foot in Front: Loses balance while stepping or standing Standing on One Leg: Unable to try or needs assist to prevent fall Total Score: 22 Static Sitting Balance Static Sitting - Level of Assistance: 5: Stand by assistance Dynamic Sitting Balance Dynamic Sitting - Level of Assistance: 5: Stand by assistance Static Standing Balance Static Standing - Level of Assistance: 4: Min assist Dynamic Standing Balance Dynamic Standing - Level of Assistance: 4: Min assist  Extremity/Trunk Assessment RUE Assessment RUE  Assessment: Within Functional Limits LUE Assessment LUE Assessment: Within Functional Limits  FIM:  FIM - Grooming Grooming Steps: Wash, rinse, dry face;Wash, rinse, dry hands;Oral care, brush teeth, clean dentures;Brush, comb hair;Shave or apply make-up Grooming: 5: Set-up assist to obtain items FIM - Bathing Bathing Steps Patient Completed: Chest;Right Arm;Left Arm;Abdomen;Front perineal area;Buttocks;Right upper leg;Left upper leg;Right lower leg (including foot);Left lower leg (including foot) Bathing: 4: Steadying assist FIM - Upper Body Dressing/Undressing Upper body dressing/undressing steps patient completed: Thread/unthread right bra strap;Thread/unthread left bra strap;Thread/unthread right sleeve of pullover shirt/dresss;Thread/unthread left sleeve of pullover shirt/dress;Put head through opening of pull over shirt/dress Upper body dressing/undressing: 3: Mod-Patient completed 50-74% of tasks FIM - Lower Body Dressing/Undressing Lower body dressing/undressing steps patient completed: Thread/unthread right underwear leg;Thread/unthread left underwear leg;Thread/unthread right pants leg;Thread/unthread left pants leg;Don/Doff right sock;Don/Doff left sock;Don/Doff right shoe;Don/Doff left shoe Lower body dressing/undressing: 4: Min-Patient completed 75 plus % of tasks FIM - Toileting Toileting steps completed by patient: Adjust clothing prior to toileting;Performs perineal hygiene;Adjust clothing after toileting Toileting: 4: Steadying assist FIM - Banker Devices: Bed rails Bed/Chair Transfer: 4: Supine > Sit: Min A (steadying Pt. > 75%/lift 1 leg);5: Sit > Supine: Supervision (verbal cues/safety issues);4: Bed > Chair or W/C: Min A (steadying Pt. > 75%);4: Chair or W/C > Bed: Min A (steadying Pt. > 75%) FIM - Diplomatic Services operational officer Devices: Elevated toilet seat;Grab bars Toilet Transfers: 4-To toilet/BSC: Min A  (steadying Pt. > 75%);4-From toilet/BSC: Min A (steadying Pt. > 75%) FIM - Tub/Shower Transfers Tub/Shower Assistive Devices: Tub transfer bench;Grab bars Tub/shower Transfers: 4-Into Tub/Shower: Min A (steadying Pt. > 75%/lift 1 leg);4-Out of Tub/Shower: Min A (steadying Pt. > 75%/lift 1 leg)   Refer to Care Plan for Long Term Goals  Recommendations for other services: None at this time.  Discharge Criteria: Patient will be discharged from OT if patient refuses treatment 3 consecutive times without medical reason, if treatment goals not met, if there is a change in medical status, if patient makes no progress towards goals or if patient is discharged from hospital.  The above assessment, treatment plan, treatment alternatives and goals were discussed and mutually agreed upon: by patient  -----------------------------------------------------------------------------------------------------------------------------------  SESSION NOTES  Session #1 [id number] - 55 Minutes Individual Therapy Patient with 6/10 complaints  of pain in lower back; RN aware and patient repositioned Initial 1:1 occupational therapy evaluation completed. Patient received supine in bed. Patient engaged in bed mobility with minimal assistance and min verbal cues to adhere to sternal precautions, HOB elevated and patient using bed rails. From here, patient transferred edge of bed -> w/c with minimal assistance and min verbal cues in order to adhere to sternal precautions. From here, patient sat at sink to complete UB/LB bathing & dressing in sit<>stand position. Patient able to stand with steady assist and min verbal cues to adhere to sternal precautions. Patient with poor endurance throughout session, patient was on 3 liters of 02; 02 checked and =91%. Therapist donned bilateral TED hose per order. Left patient seated in w/c in front of sink to complete grooming task of applying makeup. PT session next.   Session  #2 [id number] - 40 Minutes Individual Therapy No complaints of pain Patient found seated edge of bed with daughter present at bedside. Patient transferred edge of bed -> w/c without UE support with steady assist from therapist. From here, patient propelled self from room -> ADL apartment (taking more than reasonable amount of time). Once in ADL apartment patient engaged in toilet transfer onto elevated toilet seat, toileting, and grooming tasks standing at sink; all without UE support. After toileting tasks, patient demonstrated tub/shower transfer onto tub transfer bench. Therapist then propelled patient -> therapy gym and engaged patient in sit<>stands from lower/short surfaces without UE support in order to help increase independence during self-care tasks. Therapist propelled patient back to room at end of session and left patient seated in w/c. Patient with significant posterior tilt while seated in w/c; therapist notified NT of this. All necessary items left within reach.   Sylvia Helms 07/25/2013, 3:33 PM

## 2013-07-26 ENCOUNTER — Inpatient Hospital Stay (HOSPITAL_COMMUNITY): Payer: Medicare Other | Admitting: Physical Therapy

## 2013-07-26 ENCOUNTER — Inpatient Hospital Stay (HOSPITAL_COMMUNITY): Payer: Medicare Other

## 2013-07-26 ENCOUNTER — Inpatient Hospital Stay (HOSPITAL_COMMUNITY): Payer: Medicare Other | Admitting: Occupational Therapy

## 2013-07-26 DIAGNOSIS — R5381 Other malaise: Secondary | ICD-10-CM

## 2013-07-26 DIAGNOSIS — Z951 Presence of aortocoronary bypass graft: Secondary | ICD-10-CM

## 2013-07-26 DIAGNOSIS — I359 Nonrheumatic aortic valve disorder, unspecified: Secondary | ICD-10-CM

## 2013-07-26 DIAGNOSIS — I2510 Atherosclerotic heart disease of native coronary artery without angina pectoris: Secondary | ICD-10-CM

## 2013-07-26 LAB — BASIC METABOLIC PANEL
BUN: 41 mg/dL — ABNORMAL HIGH (ref 6–23)
Calcium: 8.7 mg/dL (ref 8.4–10.5)
Chloride: 104 mEq/L (ref 96–112)
Creatinine, Ser: 1.41 mg/dL — ABNORMAL HIGH (ref 0.50–1.10)
GFR calc Af Amer: 40 mL/min — ABNORMAL LOW (ref >90)
GFR calc non Af Amer: 35 mL/min — ABNORMAL LOW (ref >90)
Potassium: 4.3 mEq/L (ref 3.5–5.1)
Sodium: 140 mEq/L (ref 135–145)

## 2013-07-26 LAB — GLUCOSE, CAPILLARY
Glucose-Capillary: 133 mg/dL — ABNORMAL HIGH (ref 70–99)
Glucose-Capillary: 84 mg/dL (ref 70–99)
Glucose-Capillary: 144 mg/dL — ABNORMAL HIGH (ref 70–99)

## 2013-07-26 LAB — TYPE AND SCREEN
ABO/RH(D): A POS
Antibody Screen: NEGATIVE
Unit division: 0
Unit division: 0

## 2013-07-26 LAB — HEMOGLOBIN AND HEMATOCRIT, BLOOD
HCT: 27.5 % — ABNORMAL LOW (ref 36.0–46.0)
Hemoglobin: 8.7 g/dL — ABNORMAL LOW (ref 12.0–15.0)

## 2013-07-26 NOTE — Plan of Care (Signed)
Problem: RH PAIN MANAGEMENT Goal: RH STG PAIN MANAGED AT OR BELOW PT'S PAIN GOAL Pain level 3 or less on a scale of 0-10.  Outcome: Not Progressing Pain 4/10

## 2013-07-26 NOTE — Progress Notes (Signed)
[name] is a 79 y.o. female 08-Jul-1932 [id number]  Subjective: No new complaints. No new problems. Slept well. Feeling OK.  Objective: Vital signs in last 24 hours: Temp:  [98.1 F (36.7 C)] 98.1 F (36.7 C) (11/08 0610) Pulse Rate:  [63-70] 63 (11/08 0610) Resp:  [16-20] 20 (11/08 0610) BP: (123-172)/(71-75) 172/75 mmHg (11/08 0610) SpO2:  [93 %-96 %] 95 % (11/08 0610) Weight:  [122 lb 3.2 oz (55.43 kg)] 122 lb 3.2 oz (55.43 kg) (11/08 0500) Weight change: -5 lb 14.4 oz (-2.676 kg) Last BM Date: 07/23/13  Intake/Output from previous day: 11/07 0701 - 11/08 0700 In: 1440 [P.O.:1440] Out: 1 [Urine:1] Last cbgs: CBG (last 3)   Recent Labs  07/25/13 1632 07/25/13 2056 07/26/13 0709  GLUCAP 161* 108* 133*     Physical Exam General: No apparent distress    HEENT: moist mucosa Lungs: Normal effort. Lungs clear to auscultation, no crackles or wheezes. Cardiovascular: Regular rate and rhythm, no edema Abdomen: S/NT/ND; BS(+) Musculoskeletal:  No change from before: chest wall tender interiorly post-op Neurological: No new neurological deficits Wounds: N/A    Skin: clear Alert, cooperative   Lab Results: BMET    Component Value Date/Time   NA 140 07/26/2013 0550   K 4.3 07/26/2013 0550   CL 104 07/26/2013 0550   CO2 26 07/26/2013 0550   GLUCOSE 127* 07/26/2013 0550   BUN 41* 07/26/2013 0550   CREATININE 1.41* 07/26/2013 0550   CALCIUM 8.7 07/26/2013 0550   GFRNONAA 35* 07/26/2013 0550   GFRAA 40* 07/26/2013 0550   CBC    Component Value Date/Time   WBC 10.0 07/25/2013 0520   RBC 2.95* 07/25/2013 0520   HGB 8.7* 07/26/2013 0550   HCT 27.5* 07/26/2013 0550   PLT 234 07/25/2013 0520   MCV 91.2 07/25/2013 0520   MCH 28.8 07/25/2013 0520   MCHC 31.6 07/25/2013 0520   RDW 14.8 07/25/2013 0520   LYMPHSABS 2.2 07/25/2013 0520   MONOABS 1.2* 07/25/2013 0520   EOSABS 0.8* 07/25/2013 0520   BASOSABS 0.1 07/25/2013 0520    Studies/Results: Dg Chest 2 View  07/24/2013    CLINICAL DATA:  Dyspnea, weakness, and known pleural effusions  EXAM: CHEST  2 VIEW  COMPARISON:  Jan 20, 2013  FINDINGS: There is a moderate sized left-sided pleural effusion which has slightly decreased in size since yesterday's study. On the right there is a trace of pleural fluid blunting the costophrenic gutters. This is slightly less conspicuous today. The cardiac silhouette is normal in size. The pulmonary vascularity is not engorged. There is no pneumothorax nor pneumomediastinum. The patient has undergone previous median sternotomy and aortic valve replacement. The observed portions of the bony thorax exhibit no acute abnormalities. There is multi level degenerative disc space narrowing of the thoracic spine.  IMPRESSION: There are persistent bilateral pleural effusions greater on the left than on the right but these have slightly decreased in size since yesterday's study. There is no focal pneumonia nor overt evidence of CHF.   Electronically Signed   By: David  [place]   On: 07/24/2013 11:19    Medications: I have reviewed the patient's current medications.  Assessment/Plan:   1. Deconditioning CAD/aortic stenosis/status post AVR/CABG 07/17/2013  2. DVT Prophylaxis/Anticoagulation: Subcutaneous Lovenox. Monitor platelet counts and any signs of bleeding  3. Pain Management: Ultram 50 mg every 6 hours as needed. Monitor with increased activity  4. Mood/depression. Zoloft 50 mg daily. Provide emotional support  5. Neuropsych: This patient is  capable of making decisions on her own behalf.  6. Acute blood loss anemia. hgb 8.5  7. Hypertension. Norvasc 5 mg daily, Lasix 20 mg daily, Avapro 75 mg daily and Lopressor 25 mg twice a day. Monitor with increased activity  8. History of asthma. Continue inhalers as directed. Check oxygen saturations every shift.  9. GERD. Protonix  10. Hyperlipidemia. Zocor  11. Diabetes mellitus with peripheral neuropathy. Hemoglobin A1c 6.6. Resume Glucophage as  tolerated as prior to admission.  12. Left pleural effusion will monitor for increasing shortness of breath.  13. Renal insufficiency: bun/cr slowly drifting up--hold lasix today. Recheck bmet saturday.  Cont Rx    Length of stay, days: 2  Sonda Primes , MD 07/26/2013, 8:14 AM

## 2013-07-26 NOTE — Progress Notes (Signed)
Physical Therapy Session Note  Patient Details  Name: [name] MRN: [id number] Date of Birth: Sep 26, 1931  Today's Date: 07/26/2013 Time: 0801-0857 Time Calculation (min): 56 min  Short Term Goals: Week 1:  PT Short Term Goal 1 (Week 1): Pt will perform functional transfers with S PT Short Term Goal 2 (Week 1): Pt will perform bed mobility with S while maintaining sternal precautions PT Short Term Goal 3 (Week 1): Pt will be able to gait x 71' with S  Skilled Therapeutic Interventions/Progress Updates:  Pt was seen bedside in the am. Pt transferred edge of bed to w/c with min A and verbal cues for sternal precautions. In gym treatment focused on standing for build LE strengthening and activity tolerance. Pt ambulated with rolling walker 80 feet x 3 with min guard and verbal cues for technique. Pt required verbal cues throughout treatment session for sternal precautions while performing functional activities.   Therapy Documentation Precautions:  Precautions Precautions: Sternal;Fall Precaution Comments: cues for precautions functionally Restrictions Weight Bearing Restrictions: No General:   Pain: Pt c/o min to mod back pain.  Mobility:   Locomotion : Ambulation Ambulation/Gait Assistance: 4: Min guard   See FIM for current functional status  Therapy/Group: Individual Therapy  Rayford Halsted 07/26/2013, 4:10 PM

## 2013-07-26 NOTE — Progress Notes (Signed)
Physical Therapy Session Note  Patient Details  Name: [name] MRN: [id number] Date of Birth: 04/29/1932  Today's Date: 07/26/2013 Time: 4098-1191 Time Calculation (min): 38 min  Short Term Goals: Week 1:  PT Short Term Goal 1 (Week 1): Pt will perform functional transfers with S PT Short Term Goal 2 (Week 1): Pt will perform bed mobility with S while maintaining sternal precautions PT Short Term Goal 3 (Week 1): Pt will be able to gait x 2' with S  Skilled Therapeutic Interventions/Progress Updates:  Pt was seen bedside in the pm. Pt transferred edge to w/c with min guard and verbal cues. Transported to rehab gym, treatment focused on standing exercises to increase LE strength and standing tolerance. Pt ambulated with rolling walker and min guard about 110 feet with verbal cues. Pt transferred edge of bed to supine with S.   Therapy Documentation Precautions:  Precautions Precautions: Sternal;Fall Precaution Comments: cues for precautions functionally Restrictions Weight Bearing Restrictions: No General:   Vital Signs:  Pain: Pain Assessment Pain Assessment: 0-10 Pain Score: 6  Pain Type: Acute pain Pain Location: Back Pain Intervention(s): Medication (See eMAR) Mobility:   Locomotion : Ambulation Ambulation/Gait Assistance: 4: Min guard   See FIM for current functional status  Therapy/Group: Individual Therapy  Rayford Halsted 07/26/2013, 4:15 PM

## 2013-07-26 NOTE — Progress Notes (Signed)
Occupational Therapy Session Note  Patient Details  Name: [name] MRN: [id number] Date of Birth: 05-17-1932  Today's Date: 07/26/2013 Time:  -     Short Term Goals: Week 1:  OT Short Term Goal 1 (Week 1): Patient will complete UB/LB bathing with supervision OT Short Term Goal 2 (Week 1): Patient will complete UB/LB dressing with supervision OT Short Term Goal 3 (Week 1): Patient will perform toielt transfer using DME prn with supervision OT Short Term Goal 4 (Week 1): Patient will perform tub/shower transfer using DME prn with supervision OT Short Term Goal 5 (Week 1): Patient will independent adhere to sternal precautions during ADL routine  Skilled Therapeutic Interventions/Progress Updates:  Patient found seated in w/c with no complaints of pain. Patient with request to use bathroom. Therapist donned 02->portable device, then patient ambulated from w/c -> bathroom for toilet transfer with close supervision. From here patient completed toileting tasks with supervision, then ambulated -> sink in room. Patient then completed UB/LB bathing & dressing in sit<>stand position at sink side. Patient takes more than reasonable amount of time to complete tasks secondary to her deconditioned status. She remained on 3 liter of 02 throughout session. At end of session, patient left seated in w/c at sink to finish grooming tasks, RN aware of this.   Precautions:  Precautions Precautions: Sternal;Fall Precaution Comments: cues for precautions functionally Restrictions Weight Bearing Restrictions: No  See FIM for current functional status  Therapy/Group: Individual Therapy  Tajuana Kniskern 07/26/2013, 7:26 AM

## 2013-07-26 NOTE — Progress Notes (Signed)
Occupational Therapy Session Note  Patient Details  Name: [name] MRN: [id number] Date of Birth: Nov 01, 1931  Today's Date: 07/26/2013 Time: 1415-1500 Time Calculation (min): 45 min  Short Term Goals: Week 1:  OT Short Term Goal 1 (Week 1): Patient will complete UB/LB bathing with supervision OT Short Term Goal 2 (Week 1): Patient will complete UB/LB dressing with supervision OT Short Term Goal 3 (Week 1): Patient will perform toielt transfer using DME prn with supervision OT Short Term Goal 4 (Week 1): Patient will perform tub/shower transfer using DME prn with supervision OT Short Term Goal 5 (Week 1): Patient will independent adhere to sternal precautions during ADL routine  Skilled Therapeutic Interventions/Progress Updates: Therapeutic activities with emphasis on adherence to sternal precautions during functional transfers, improved functional endurance, energy conservation, home mods instruction, and adapted simple meal prep.    Patient ambulated from room to gym using RW (115') with standby assist to manage w/c and oxygen and verbal cues for safety.  Patient completed task with 1 rest break while on 2L continuous 02.  02 sats dropped to 88% but recovered to 93% within 5 minutes, HR 78 bpm.   From gym patient ambulated to ADL apartment and was educated on use of furniture risers to improve sit-stand after failing to rise from low love seat.   From love seat patient ambulated to kitchen to complete simple meal prep, completing preparation of melted cheese sandwich with min assist (setup and access to microwave assist).   Patient demonstrated waning endurance at ADL kitchen with evidence of increased confusion during final task, delayed reasoning, and need for supervision and repeated cues, although reporting that she was satisfied with her performance with preparing a sandwich.   Patient was returned to her room in her w/c; OT was unable to assess 02 sats at end of session d/t pulse-ox  equip malfunction.    Therapy Documentation Precautions:  Precautions Precautions: Sternal;Fall Precaution Comments: cues for precautions functionally Restrictions Weight Bearing Restrictions: No  Vital Signs: Therapy Vitals Temp: 97.7 F (36.5 C) Temp src: Oral Pulse Rate: 69 Resp: 18 BP: 117/70 mmHg Patient Position, if appropriate: Sitting Oxygen Therapy SpO2: 97 % O2 Device: None (Room air) O2 Flow Rate (L/min): 2 L/min  Pain: Pain Assessment Pain Assessment: 0-10 Pain Score: 6  Pain Type: Acute pain Pain Location: Back Pain Intervention(s): Medication (See eMAR)  See FIM for current functional status  Therapy/Group: Individual Therapy  Kasey Hansell 07/26/2013, 3:35 PM

## 2013-07-27 ENCOUNTER — Inpatient Hospital Stay (HOSPITAL_COMMUNITY): Payer: Medicare Other

## 2013-07-27 DIAGNOSIS — Z954 Presence of other heart-valve replacement: Secondary | ICD-10-CM

## 2013-07-27 DIAGNOSIS — E785 Hyperlipidemia, unspecified: Secondary | ICD-10-CM

## 2013-07-27 DIAGNOSIS — J449 Chronic obstructive pulmonary disease, unspecified: Secondary | ICD-10-CM

## 2013-07-27 LAB — GLUCOSE, CAPILLARY
Glucose-Capillary: 130 mg/dL — ABNORMAL HIGH (ref 70–99)
Glucose-Capillary: 105 mg/dL — ABNORMAL HIGH (ref 70–99)
Glucose-Capillary: 162 mg/dL — ABNORMAL HIGH (ref 70–99)
Glucose-Capillary: 103 mg/dL — ABNORMAL HIGH (ref 70–99)

## 2013-07-27 MED ORDER — IPRATROPIUM-ALBUTEROL 20-100 MCG/ACT IN AERS
2.0000 | INHALATION_SPRAY | Freq: Four times a day (QID) | RESPIRATORY_TRACT | Status: DC
  Administered 2013-07-27 – 2013-07-29 (×7): 2 via RESPIRATORY_TRACT
  Filled 2013-07-27: qty 4

## 2013-07-27 NOTE — Progress Notes (Signed)
Patient Desat at 77's after taking to the bedside commode.Expiratory wheezing and shortness of breath noted.  HOB 30 degrees. Oxygen at 3L. Called Respiratory therapist for PRN Xopenex and for further evaluation. Rechecked O2 95-97. Respiratory Therapist arrived immediately. O2 at 97 after giving Xopenex. Audible wheezing still noted. Will continue to monitor and report will be given to the next shift nurse.

## 2013-07-27 NOTE — Discharge Summary (Signed)
patient examined and medical record reviewed,agree with above note. VAN TRIGT III,PETER 07/27/2013

## 2013-07-27 NOTE — Progress Notes (Signed)
[name] is a 79 y.o. female 1932-04-28 [id number]  Subjective: No new complaints. No new problems. Slept well. Feeling OK.  Objective: Vital signs in last 24 hours: Temp:  [97.7 F (36.5 C)] 97.7 F (36.5 C) (11/08 1500) Pulse Rate:  [69-76] 73 (11/09 0547) Resp:  [18] 18 (11/09 0547) BP: (117-159)/(70) 159/70 mmHg (11/09 0547) SpO2:  [80 %-97 %] 97 % (11/09 0558) Weight:  [127 lb 13.9 oz (58 kg)] 127 lb 13.9 oz (58 kg) (11/09 0500) Weight change: 5 lb 10.7 oz (2.57 kg) Last BM Date: 07/23/13  Intake/Output from previous day: 11/08 0701 - 11/09 0700 In: 1200 [P.O.:1200] Out: -  Last cbgs: CBG (last 3)   Recent Labs  07/26/13 1621 07/26/13 2112 07/27/13 0704  GLUCAP 129* 144* 130*     Physical Exam General: No apparent distress  Eating breakfast  HEENT: moist mucosa Lungs: Normal effort. Lungs clear to auscultation, no crackles or wheezes. Cardiovascular: Regular rate and rhythm, no edema Abdomen: S/NT/ND; BS(+) Musculoskeletal:  No change from before: chest wall tender interiorly post-op Neurological: No new neurological deficits Wounds: N/A    Skin: clear Alert, cooperative   Lab Results: BMET    Component Value Date/Time   NA 140 07/26/2013 0550   K 4.3 07/26/2013 0550   CL 104 07/26/2013 0550   CO2 26 07/26/2013 0550   GLUCOSE 127* 07/26/2013 0550   BUN 41* 07/26/2013 0550   CREATININE 1.41* 07/26/2013 0550   CALCIUM 8.7 07/26/2013 0550   GFRNONAA 35* 07/26/2013 0550   GFRAA 40* 07/26/2013 0550   CBC    Component Value Date/Time   WBC 10.0 07/25/2013 0520   RBC 2.95* 07/25/2013 0520   HGB 8.7* 07/26/2013 0550   HCT 27.5* 07/26/2013 0550   PLT 234 07/25/2013 0520   MCV 91.2 07/25/2013 0520   MCH 28.8 07/25/2013 0520   MCHC 31.6 07/25/2013 0520   RDW 14.8 07/25/2013 0520   LYMPHSABS 2.2 07/25/2013 0520   MONOABS 1.2* 07/25/2013 0520   EOSABS 0.8* 07/25/2013 0520   BASOSABS 0.1 07/25/2013 0520    Studies/Results: No results found.  Medications: I  have reviewed the patient's current medications.  Assessment/Plan:   1. Deconditioning CAD/aortic stenosis/status post AVR/CABG 07/17/2013  2. DVT Prophylaxis/Anticoagulation: Subcutaneous Lovenox. Monitor platelet counts and any signs of bleeding  3. Pain Management: Ultram 50 mg every 6 hours as needed. Monitor with increased activity  4. Mood/depression. Zoloft 50 mg daily. Provide emotional support  5. Neuropsych: This patient is capable of making decisions on her own behalf.  6. Acute blood loss anemia. hgb 8.5  7. Hypertension. Norvasc 5 mg daily, Lasix 20 mg daily, Avapro 75 mg daily and Lopressor 25 mg twice a day. Monitor with increased activity. Seems to be getting better 8. History of asthma. Continue inhalers as directed. Check oxygen saturations every shift.  9. GERD. Protonix  10. Hyperlipidemia. Zocor  11. Diabetes mellitus with peripheral neuropathy. Hemoglobin A1c 6.6. Resume Glucophage as tolerated as prior to admission.  12. Left pleural effusion will monitor for increasing shortness of breath.  13. Renal insufficiency: bun/cr slowly drifting up--hold lasix today. Recheck bmet saturday.  Cont Rx    Length of stay, days: 3  Sonda Primes , MD 07/27/2013, 7:58 AM

## 2013-07-27 NOTE — Progress Notes (Signed)
Occupational Therapy Session Note  Patient Details  Name: [name] MRN: [id number] Date of Birth: May 25, 1932  Today's Date: 07/27/2013 Time: 1115-1200 Time Calculation (min): 45 min  Short Term Goals: Week 1:  OT Short Term Goal 1 (Week 1): Patient will complete UB/LB bathing with supervision OT Short Term Goal 2 (Week 1): Patient will complete UB/LB dressing with supervision OT Short Term Goal 3 (Week 1): Patient will perform toielt transfer using DME prn with supervision OT Short Term Goal 4 (Week 1): Patient will perform tub/shower transfer using DME prn with supervision OT Short Term Goal 5 (Week 1): Patient will independent adhere to sternal precautions during ADL routine  Skilled Therapeutic Interventions/Progress Updates:    Pt seen for ADL retraining with focus on carryover of sternal precautions, functional transfers, and activity tolerance. Pt on 3L of O2 throughout therapy session and required min verbal cues for rest breaks after pt having mild SOB. Completed bathing and dressing at sink. Pt required min verbal cues for sternal precautions during functional tasks. Pt ambulated w/c<>toilet with steadying assist-supervision. Pt returned to w/c and completed oral care in standing for activity tolerance. Discussed energy conservation techniques at home and pt appeared to be very interested. At end of session pt left sitting at sink to finish makeup with RN present.   Therapy Documentation Precautions:  Precautions Precautions: Sternal;Fall Precaution Comments: cues for precautions functionally Restrictions Weight Bearing Restrictions: No General:   Vital Signs: Oxygen Therapy SpO2: 98 % O2 Device: Nasal cannula O2 Flow Rate (L/min): 3 L/min Pain: No c/o pain during therapy session.   See FIM for current functional status  Therapy/Group: Individual Therapy  Daneil Dan 07/27/2013, 12:10 PM

## 2013-07-28 ENCOUNTER — Inpatient Hospital Stay (HOSPITAL_COMMUNITY): Payer: Medicare Other | Admitting: Occupational Therapy

## 2013-07-28 ENCOUNTER — Inpatient Hospital Stay (HOSPITAL_COMMUNITY): Payer: Medicare Other

## 2013-07-28 DIAGNOSIS — I080 Rheumatic disorders of both mitral and aortic valves: Secondary | ICD-10-CM

## 2013-07-28 DIAGNOSIS — R5381 Other malaise: Secondary | ICD-10-CM

## 2013-07-28 DIAGNOSIS — J449 Chronic obstructive pulmonary disease, unspecified: Secondary | ICD-10-CM

## 2013-07-28 LAB — GLUCOSE, CAPILLARY
Glucose-Capillary: 155 mg/dL — ABNORMAL HIGH (ref 70–99)
Glucose-Capillary: 151 mg/dL — ABNORMAL HIGH (ref 70–99)

## 2013-07-28 NOTE — Progress Notes (Signed)
Subjective/Complaints: Slept well. Noticed some fluid on feet yesterday. Feels like she's progressing  Objective: Vital Signs: Blood pressure 163/79, pulse 52, temperature 98 F (36.7 C), temperature source Oral, resp. rate 19, height 5' (1.524 m), weight 58 kg (127 lb 13.9 oz), SpO2 94.00%. No results found.  Recent Labs  07/26/13 0550  HGB 8.7*  HCT 27.5*    Recent Labs  07/26/13 0550  NA 140  K 4.3  CL 104  GLUCOSE 127*  BUN 41*  CREATININE 1.41*  CALCIUM 8.7   CBG (last 3)   Recent Labs  07/27/13 1621 07/27/13 2103 07/28/13 0713  GLUCAP 162* 103* 116*    Wt Readings from Last 3 Encounters:  07/27/13 58 kg (127 lb 13.9 oz)  07/24/13 57.607 kg (127 lb)  07/24/13 57.607 kg (127 lb)    Physical Exam:  Constitutional: Patient was able to provide her name, place and date of birth. She had some difficulty recalling her full hospital course .She followed simple commands  Frail appearing.  HENT:  Head: Normocephalic.  Eyes: EOM are normal.  Neck: Normal range of motion. Neck supple. No thyromegaly present.  Cardiovascular:  Cardiac rate controlled with murmur Respiratory:  Decreased breath sounds at the bases.   appears comfortable GI: Soft. Bowel sounds are normal. She exhibits no distension.  Neurological: She is alert.  No CN deficits. Generalized proximal greater than distal weakness. 3+ -->>4/5. No sensory findings. dtr's 2+ at bilateral patellar and Achilles.  Skin:  Midline chest incision clean and dry  motor strength is 5/5 in the right upper, 4/5 in the left biceps otherwise 5/5  4/5 bilateral hip flexors knee extensors ankle dorsiflexors and plantar flexors  Sensation intact to light touch in bilateral upper and lower limbs   Assessment/Plan: 1. Functional deficits secondary to deconditioning due to AS and multiple medical,  AVR/CABG which require 3+ hours per day of interdisciplinary therapy in a comprehensive inpatient rehab  setting. Physiatrist is providing close team supervision and 24 hour management of active medical problems listed below. Physiatrist and rehab team continue to assess barriers to discharge/monitor patient progress toward functional and medical goals. FIM: FIM - Bathing Bathing Steps Patient Completed: Chest;Right Arm;Left Arm;Abdomen;Front perineal area;Buttocks;Right upper leg;Left upper leg;Right lower leg (including foot);Left lower leg (including foot) Bathing: 5: Supervision: Safety issues/verbal cues  FIM - Upper Body Dressing/Undressing Upper body dressing/undressing steps patient completed: Thread/unthread right bra strap;Thread/unthread left bra strap;Thread/unthread right sleeve of pullover shirt/dresss;Thread/unthread left sleeve of pullover shirt/dress;Put head through opening of pull over shirt/dress;Pull shirt over trunk Upper body dressing/undressing: 4: Min-Patient completed 75 plus % of tasks FIM - Lower Body Dressing/Undressing Lower body dressing/undressing steps patient completed: Thread/unthread right underwear leg;Thread/unthread left underwear leg;Thread/unthread left pants leg;Don/Doff right sock;Don/Doff left sock;Don/Doff right shoe;Don/Doff left shoe;Pull underwear up/down;Pull pants up/down Lower body dressing/undressing: 4: Min-Patient completed 75 plus % of tasks  FIM - Toileting Toileting steps completed by patient: Adjust clothing prior to toileting;Performs perineal hygiene;Adjust clothing after toileting Toileting: 5: Supervision: Safety issues/verbal cues  FIM - Archivist Transfers Assistive Devices: Elevated toilet seat;Grab bars;Walker Toilet Transfers: 5-From toilet/BSC: Supervision (verbal cues/safety issues);4-To toilet/BSC: Min A (steadying Pt. > 75%)  FIM - Bed/Chair Transfer Bed/Chair Transfer Assistive Devices: Bed rails Bed/Chair Transfer: 5: Sit > Supine: Supervision (verbal cues/safety issues);4: Supine > Sit: Min A (steadying Pt.  > 75%/lift 1 leg);4: Chair or W/C > Bed: Min A (steadying Pt. > 75%);4: Bed > Chair or W/C: Min A (steadying Pt. >  75%)  FIM - Locomotion: Wheelchair Locomotion: Wheelchair: 1: Total Assistance/staff pushes wheelchair (Pt<25%) FIM - Locomotion: Ambulation Locomotion: Ambulation Assistive Devices: Designer, industrial/product Ambulation/Gait Assistance: 4: Min guard Locomotion: Ambulation: 2: Travels 50 - 149 ft with minimal assistance (Pt.>75%)  Comprehension Comprehension Mode: Auditory Comprehension: 6-Follows complex conversation/direction: With extra time/assistive device  Expression Expression Mode: Verbal Expression: 6-Expresses complex ideas: With extra time/assistive device  Social Interaction Social Interaction: 6-Interacts appropriately with others with medication or extra time (anti-anxiety, antidepressant).  Problem Solving Problem Solving: 5-Solves complex 90% of the time/cues < 10% of the time  Memory Memory: 6-More than reasonable amt of time  Medical Problem List and Plan:  1. Deconditioning CAD/aortic stenosis/status post AVR/CABG 07/17/2013  2. DVT Prophylaxis/Anticoagulation: Subcutaneous Lovenox. Monitor platelet counts and any signs of bleeding  3. Pain Management: Ultram 50 mg every 6 hours as needed. Monitor with increased activity  4. Mood/depression. Zoloft 50 mg daily. Provide emotional support  5. Neuropsych: This patient is capable of making decisions on her own behalf.  6. Acute blood loss anemia. hgb 8.5 7. Hypertension. Norvasc 5 mg daily, Lasix 20 mg daily, Avapro 75 mg daily and Lopressor 25 mg twice a day. Monitor with increased activity  8. History of asthma. Continue inhalers as directed. Check oxygen saturations every shift. Wean oxygen if able 9. GERD. Protonix  10. Hyperlipidemia. Zocor  11. Diabetes mellitus with peripheral neuropathy. Hemoglobin A1c 6.6. Resume Glucophage as tolerated as prior to admission.  12. Left pleural effusion 13. Renal  insufficiency:  Lasix held and resumed. Weights fairly stable. Pt notes some fluid on feet yesterday   -recheck bmet tomorrow ` -needs weight today  LOS (Days) 4 A FACE TO FACE EVALUATION WAS PERFORMED  SWARTZ,ZACHARY T 07/28/2013 7:44 AM

## 2013-07-28 NOTE — Progress Notes (Signed)
Physical Therapy Session Note  Patient Details  Name: [name] MRN: [id number] Date of Birth: May 21, 1932  Today's Date: 07/28/2013 Time: 1350-1445 Time Calculation (min): 55 min  Short Term Goals: Week 1:  PT Short Term Goal 1 (Week 1): Pt will perform functional transfers with S PT Short Term Goal 2 (Week 1): Pt will perform bed mobility with S while maintaining sternal precautions PT Short Term Goal 3 (Week 1): Pt will be able to gait x 88' with S  Skilled Therapeutic Interventions/Progress Updates:    Session focused on functional endurance/strengthening, dynamic gait training with RW while collecting items from  various heights, dynamic standing balance and tolerance activity, stair negotiation for home entry, simulated car transfer from SUV height (Jeep), and standing therex for functional strengthening. Pt with frequent rest breaks due to decreased activity tolerance and cues for pursed lip breathing. Overall close S to steady A needed with mobility - cues for O2 tubing management during mobility for safety.  Therapy Documentation Precautions:  Precautions Precautions: Sternal;Fall Precaution Comments: cues for precautions functionally Restrictions Weight Bearing Restrictions: No  Pain:  Denies pain.    See FIM for current functional status  Therapy/Group: Individual Therapy  [name] [facility] 07/28/2013, 2:51 PM

## 2013-07-28 NOTE — Progress Notes (Signed)
  Subjective:  No complaints- getting stronger Improved appetite, sleeping better Minimal postsurgical pain Objective: Vital signs in last 24 hours: Temp:  [97.3 F (36.3 C)-98 F (36.7 C)] 97.3 F (36.3 C) (11/10 1500) Pulse Rate:  [52-72] 72 (11/10 1500) Cardiac Rhythm:  [-]  Resp:  [18-19] 18 (11/10 1500) BP: (131-170)/(79-92) 131/80 mmHg (11/10 1500) SpO2:  [94 %] 94 % (11/10 0645)  Hemodynamic parameters for last 24 hours:   regular rhythm, normal sinus rhythm  Intake/Output from previous day: 11/09 0701 - 11/10 0700 In: 720 [P.O.:720] Out: -  Intake/Output this shift: Total I/O In: 480 [P.O.:480] Out: -   Alert and comfortable Lungs clear Heart rate regular Mild systolic flow murmur through valve no diastolic murmur Surgical incisions healing well  Lab Results:  Recent Labs  07/26/13 0550  HGB 8.7*  HCT 27.5*   BMET:  Recent Labs  07/26/13 0550  NA 140  K 4.3  CL 104  CO2 26  GLUCOSE 127*  BUN 41*  CREATININE 1.41*  CALCIUM 8.7    PT/INR: No results found for this basename: LABPROT, INR,  in the last 72 hours ABG    Component Value Date/Time   PHART 7.322* 07/19/2013 0504   HCO3 21.0 07/19/2013 0504   TCO2 22 07/19/2013 0504   ACIDBASEDEF 5.0* 07/19/2013 0504   O2SAT 96.0 07/19/2013 0504   CBG (last 3)   Recent Labs  07/28/13 0713 07/28/13 1110 07/28/13 1622  GLUCAP 116* 155* 103*    Assessment/Plan: S/P   Making excellent progress in rehabilitation Appreciate patient centered care provided by rehabilitation   LOS: 4 days    VAN TRIGT III,PETER 07/28/2013

## 2013-07-28 NOTE — Progress Notes (Signed)
Occupational Therapy Session Notes  Patient Details  Name: KARENE BRACKEN MRN: [id number] Date of Birth: August 03, 1932  Today's Date: 07/28/2013  Short Term Goals: Week 1:  OT Short Term Goal 1 (Week 1): Patient will complete UB/LB bathing with supervision OT Short Term Goal 2 (Week 1): Patient will complete UB/LB dressing with supervision OT Short Term Goal 3 (Week 1): Patient will perform toielt transfer using DME prn with supervision OT Short Term Goal 4 (Week 1): Patient will perform tub/shower transfer using DME prn with supervision OT Short Term Goal 5 (Week 1): Patient will independent adhere to sternal precautions during ADL routine  Skilled Therapeutic Interventions/Progress Updates:   Session #1 4098-1191 - 55 Minutes Individual Therapy No complaints of pain Upon entering room, patient found seated edge of bed. Patient on 3.5 liters of 02 via nasal cannula. Patient ambulated from eob>sink using rolling walker. Patient required minimal verbal cues in order to adhere to sternal precautions during sit>stand using RW. From here patient sat at sink for grooming tasks of washing face, washing hands, and brushing teeth. 02 sats checked during activity =99%, therapist decreased 02>2 liters. After some activity 02 sats=90%, increased 02>2.5 liters. Patient completed UB/LB bathing & dressing at sink side in sit<>stand position from w/c level. 02 sats checked after ADL and sats =76%, increased >3 liters and with pursed lip breathing and rest breaks 02=97%. Patient took more than reasonable amount of time secondary to having to take multiple rest breaks throughout session secondary to poor cardiovascular endurance and poor oxygen support. At end of session, left patient seated in w/c at sink to finish applying make-up. PT session to follow.   Session #2 1130-1200 - 30 Minutes Individual Therapy No complaints of pain Patient found seated edge of bed. Patient transferred edge of bed -> w/c with  close supervision. From here, therapist propelled patient -> therapy gym for focus on increasing endurance on NuStep machine using BLEs only (NO UEs secondary to sternal precautions). Patient only able to tolerate 8 minutes on level 1 with multiple rest breaks and on 3 liters of 02 via nasal cannula. From here, patient transferred back to w/c, therapist propelled patient -> room and patient transferred w/c -> elevated toilet seat with supervision, completed toileting with supervision, and transferred back to w/c. At end of session, left patient seated in w/c with all needed items within reach.   Precautions:  Precautions Precautions: Sternal;Fall Precaution Comments: cues for precautions functionally Restrictions Weight Bearing Restrictions: No  See FIM for current functional status  Alexius Ellington 07/28/2013, 7:18 AM

## 2013-07-28 NOTE — Progress Notes (Signed)
Physical Therapy Session Note  Patient Details  Name: [name] MRN: [id number] Date of Birth: September 22, 1931  Today's Date: 07/28/2013 Time: 0932-1032 Time Calculation (min): 60 min  Short Term Goals: Week 1:  PT Short Term Goal 1 (Week 1): Pt will perform functional transfers with S PT Short Term Goal 2 (Week 1): Pt will perform bed mobility with S while maintaining sternal precautions PT Short Term Goal 3 (Week 1): Pt will be able to gait x 24' with S  Skilled Therapeutic Interventions/Progress Updates:    Session focused on overall activity tolerance, endurance, standing tolerance, functional transfers, and gait. Pt verbalizes and presents with significant fatigue and decreased activity tolerance requiring frequent rest breaks. Checked O2 sats, but unable to get a reading with multiple pulse oximeters so went based off pt symptoms. Worked on functional task to finish putting make up on in standing but only able to tolerate about 3.15 seconds before requiring seated rest break. Gait with RW to/from bathroom and then out in hallways up to 50' at the most with intermittent LOB's occurring requiring min A to recover. Returned to bed at end of session to rest before next therapy session with close S.  Therapy Documentation Precautions:  Precautions Precautions: Sternal;Fall Precaution Comments: cues for precautions functionally Restrictions Weight Bearing Restrictions: No   Pain: Pain Assessment Pain Assessment: No/denies pain Locomotion : Ambulation Ambulation/Gait Assistance: 4: Min guard   See FIM for current functional status  Therapy/Group: Individual Therapy  [name] [facility] 07/28/2013, 10:45 AM

## 2013-07-29 ENCOUNTER — Inpatient Hospital Stay (HOSPITAL_COMMUNITY): Payer: Medicare Other | Admitting: *Deleted

## 2013-07-29 ENCOUNTER — Inpatient Hospital Stay (HOSPITAL_COMMUNITY): Payer: Medicare Other | Admitting: Physical Therapy

## 2013-07-29 ENCOUNTER — Inpatient Hospital Stay (HOSPITAL_COMMUNITY): Payer: Medicare Other

## 2013-07-29 ENCOUNTER — Inpatient Hospital Stay (HOSPITAL_COMMUNITY): Payer: Medicare Other | Admitting: Occupational Therapy

## 2013-07-29 LAB — GLUCOSE, CAPILLARY
Glucose-Capillary: 131 mg/dL — ABNORMAL HIGH (ref 70–99)
Glucose-Capillary: 107 mg/dL — ABNORMAL HIGH (ref 70–99)

## 2013-07-29 LAB — CBC
Hemoglobin: 8.8 g/dL — ABNORMAL LOW (ref 12.0–15.0)
MCV: 90.9 fL (ref 78.0–100.0)
Platelets: 276 10*3/uL (ref 150–400)
RBC: 3.08 MIL/uL — ABNORMAL LOW (ref 3.87–5.11)
WBC: 11.9 10*3/uL — ABNORMAL HIGH (ref 4.0–10.5)

## 2013-07-29 LAB — BASIC METABOLIC PANEL
CO2: 22 mEq/L (ref 19–32)
Chloride: 103 mEq/L (ref 96–112)
Creatinine, Ser: 1.27 mg/dL — ABNORMAL HIGH (ref 0.50–1.10)
Glucose, Bld: 132 mg/dL — ABNORMAL HIGH (ref 70–99)
Sodium: 141 mEq/L (ref 135–145)

## 2013-07-29 NOTE — Progress Notes (Signed)
Subjective/Complaints: Some fatigue. Overall feeling better. Notes occasional swelling on feet  Objective: Vital Signs: Blood pressure 164/67, pulse 64, temperature 97.5 F (36.4 C), temperature source Oral, resp. rate 18, height 5' (1.524 m), weight 56.7 kg (125 lb), SpO2 96.00%. No results found.  Recent Labs  07/29/13 0632  WBC 11.9*  HGB 8.8*  HCT 28.0*  PLT 276   No results found for this basename: NA, K, CL, CO, GLUCOSE, BUN, CREATININE, CALCIUM,  in the last 72 hours CBG (last 3)   Recent Labs  07/28/13 1622 07/28/13 2138 07/29/13 0723  GLUCAP 103* 151* 131*    Wt Readings from Last 3 Encounters:  07/29/13 56.7 kg (125 lb)  07/24/13 57.607 kg (127 lb)  07/24/13 57.607 kg (127 lb)    Physical Exam:  Constitutional: Patient was able to provide her name, place and date of birth. She had some difficulty recalling her full hospital course .She followed simple commands  Frail appearing.  HENT:  Head: Normocephalic.  Eyes: EOM are normal.  Neck: Normal range of motion. Neck supple. No thyromegaly present.  Cardiovascular:  Cardiac rate controlled with murmur Respiratory:  Decreased breath sounds at the bases.   appears comfortable. No wheezing. Comfortable at rest GI: Soft. Bowel sounds are normal. She exhibits no distension.  Neurological: She is alert.  No CN deficits. Generalized proximal greater than distal weakness. 3+ -->>4/5. No sensory findings. dtr's 2+ at bilateral patellar and Achilles.  Skin:  Midline chest incision clean and dry  motor strength is 5/5 in the right upper, 4/5 in the left biceps otherwise 5/5  4/5 bilateral hip flexors knee extensors ankle dorsiflexors and plantar flexors  Sensation intact to light touch in bilateral upper and lower limbs   Assessment/Plan: 1. Functional deficits secondary to deconditioning due to AS and multiple medical,  AVR/CABG which require 3+ hours per day of interdisciplinary therapy in a comprehensive  inpatient rehab setting. Physiatrist is providing close team supervision and 24 hour management of active medical problems listed below. Physiatrist and rehab team continue to assess barriers to discharge/monitor patient progress toward functional and medical goals. FIM: FIM - Bathing Bathing Steps Patient Completed: Chest;Right Arm;Left Arm;Abdomen;Front perineal area;Buttocks;Right upper leg;Left upper leg;Right lower leg (including foot);Left lower leg (including foot) Bathing: 5: Supervision: Safety issues/verbal cues  FIM - Upper Body Dressing/Undressing Upper body dressing/undressing steps patient completed: Thread/unthread right bra strap;Thread/unthread left bra strap;Thread/unthread right sleeve of pullover shirt/dresss;Thread/unthread left sleeve of pullover shirt/dress;Put head through opening of pull over shirt/dress;Pull shirt over trunk Upper body dressing/undressing: 4: Min-Patient completed 75 plus % of tasks FIM - Lower Body Dressing/Undressing Lower body dressing/undressing steps patient completed: Thread/unthread right underwear leg;Thread/unthread left underwear leg;Thread/unthread left pants leg;Don/Doff right sock;Don/Doff left sock;Don/Doff right shoe;Don/Doff left shoe;Pull underwear up/down;Pull pants up/down;Thread/unthread right pants leg Lower body dressing/undressing: 4: Steadying Assist  FIM - Toileting Toileting steps completed by patient: Adjust clothing prior to toileting;Performs perineal hygiene;Adjust clothing after toileting Toileting Assistive Devices: Grab bar or rail for support Toileting: 5: Supervision: Safety issues/verbal cues  FIM - Diplomatic Services operational officer Devices: Elevated toilet seat;Grab bars;Walker Toilet Transfers: 5-From toilet/BSC: Supervision (verbal cues/safety issues);4-To toilet/BSC: Min A (steadying Pt. > 75%);5-To toilet/BSC: Supervision (verbal cues/safety issues)  FIM - Landscape architect Devices: Bed rails Bed/Chair Transfer: 5: Chair or W/C > Bed: Supervision (verbal cues/safety issues);5: Sit > Supine: Supervision (verbal cues/safety issues)  FIM - Locomotion: Wheelchair Locomotion: Wheelchair: 1: Total Assistance/staff pushes wheelchair (Pt<25%) FIM -  Locomotion: Ambulation Locomotion: Ambulation Assistive Devices: Designer, industrial/product Ambulation/Gait Assistance: 4: Min guard Locomotion: Ambulation: 2: Travels 50 - 149 ft with minimal assistance (Pt.>75%)  Comprehension Comprehension Mode: Auditory Comprehension: 6-Follows complex conversation/direction: With extra time/assistive device  Expression Expression Mode: Verbal Expression: 6-Expresses complex ideas: With extra time/assistive device  Social Interaction Social Interaction: 6-Interacts appropriately with others with medication or extra time (anti-anxiety, antidepressant).  Problem Solving Problem Solving: 5-Solves complex 90% of the time/cues < 10% of the time  Memory Memory: 6-More than reasonable amt of time  Medical Problem List and Plan:  1. Deconditioning CAD/aortic stenosis/status post AVR/CABG 07/17/2013  2. DVT Prophylaxis/Anticoagulation: Subcutaneous Lovenox. Monitor platelet counts and any signs of bleeding  3. Pain Management: Ultram 50 mg every 6 hours as needed. Monitor with increased activity  4. Mood/depression. Zoloft 50 mg daily. Provide emotional support  5. Neuropsych: This patient is capable of making decisions on her own behalf.  6. Acute blood loss anemia. hgb 8.5 7. Hypertension. Norvasc 5 mg daily, Lasix 20 mg daily, Avapro 75 mg daily and Lopressor 25 mg twice a day. Monitor with increased activity  8. History of asthma. Continue inhalers as directed.   Wean oxygen if able 9. GERD. Protonix  10. Hyperlipidemia. Zocor  11. Diabetes mellitus with peripheral neuropathy. Hemoglobin A1c 6.6. Resume Glucophage as tolerated as prior to admission.  12. Left pleural  effusion 13. Renal insufficiency:  Lasix held and resumed. Weights fairly stable., I's and O's up a little. Still has some orthopnea. Pt notes some fluid on feet   -bmet pending today  -if reasonable may go up to 20mg  bid on lasix   LOS (Days) 5 A FACE TO FACE EVALUATION WAS PERFORMED  Sheresa Cullop T 07/29/2013 7:49 AM

## 2013-07-29 NOTE — Evaluation (Signed)
Recreational Therapy Assessment and Plan  Patient Details  Name: [name] MRN: [id number] Date of Birth: 1932-07-08 Today's Date: 07/29/2013  Rehab Potential: Good ELOS: 2 weeks   Assessment Clinical Impression: Problem List:  Patient Active Problem List    Diagnosis  Date Noted   .  Physical deconditioning  07/24/2013   .  S/P AVR (aortic valve replacement)  07/23/2013   .  S/P CABG x 1  07/23/2013   .  Aortic stenosis  06/10/2013   .  Dyspnea  06/10/2013   .  Essential hypertension  06/10/2013   .  Hyperlipidemia  06/10/2013   .  COPD (chronic obstructive pulmonary disease)  10/26/2011    Past Medical History:  Past Medical History   Diagnosis  Date   .  Hypertension    .  Asthma      uses inhaler as needed   .  Diabetes mellitus    .  GERD (gastroesophageal reflux disease)    .  Arthritis    .  Anxiety    .  Depression    .  Hyperlipidemia    .  Emphysema of lung    .  Glaucoma    .  [name]    .  Aortic stenosis     Past Surgical History:  Past Surgical History   Procedure  Laterality  Date   .  Ankle surgery   1998     d/t fx. Left ankle   .  Facial cosmetic surgery       face lift   .  Nose surgery     .  Laparoscopic assisted vaginal hysterectomy   06/21/2011     Procedure: LAPAROSCOPIC ASSISTED VAGINAL HYSTERECTOMY; Surgeon: Freddy Finner; Location: [facility]; Service: Gynecology; Laterality: N/A;   .  Salpingoophorectomy   06/21/2011     Procedure: SALPINGO OOPHERECTOMY; Surgeon: Freddy Finner; Location: [facility]; Service: Gynecology; Laterality: Bilateral;   .  Anterior and posterior repair   06/21/2011     Procedure: ANTERIOR (CYSTOCELE) AND POSTERIOR REPAIR (RECTOCELE); Surgeon: Freddy Finner; Location: [facility]; Service: Gynecology; Laterality: N/A;   .  Vaginal prolapse repair   06/21/2011     Procedure: SACROPEXY; Surgeon: Freddy Finner; Location: [facility]; Service: Gynecology; Laterality: N/A; sacrospinous ligament suspension   .   Cardiac catheterization     .  Aortic valve replacement  N/A  07/17/2013     Procedure: AORTIC VALVE REPLACEMENT (AVR); Surgeon: Kerin Perna, MD; Location: [facility] OR; Service: Open Heart Surgery; Laterality: N/A;   .  Coronary artery bypass graft  N/A  07/17/2013     Procedure: CORONARY ARTERY BYPASS GRAFT, TIMES ONE, ON PUMP, USING RIGHT INTERNAL MAMMARY ARTERY.; Surgeon: Kerin Perna, MD; Location: [facility] OR; Service: Open Heart Surgery; Laterality: N/A;   .  Intraoperative transesophageal echocardiogram  N/A  07/17/2013     Procedure: INTRAOPERATIVE TRANSESOPHAGEAL ECHOCARDIOGRAM; Surgeon: Kerin Perna, MD; Location: [facility] OR; Service: Open Heart Surgery; Laterality: N/A;   .  Abdominal hysterectomy     .  Cardiac valve replacement     .  Coronary angioplasty      Assessment & Plan  Clinical Impression: Patient is a 79 y.o. year old right-handed female admitted 07/17/2013 with progressive shortness of breath/chest pain and known history of aortic stenosis. Cardiac catheterization showed 70% stenosis in the mid LAD and LV systolic function fairly well preserved. Underwent aortic valve replacement/CABG x1 07/17/2013 per  Dr. Zenaida Niece trigt and placed on aspirin therapy. Subcutaneous Lovenox initiated 07/22/2013 for DVT prophylaxis. Postoperatively with bouts of confusion decreased awareness. Cranial CT scan showed no acute abnormalities. Acute blood loss anemia 8.5-9.1 and monitored. Followup speech therapy for questionable dysphagia diet since been advanced to mechanical soft with thin liquids and tolerating nicely. Sternal precautions implemented after CABG. Physical and occupational therapy evaluations completed 07/19/2013 with recommendations for physical medicine rehabilitation consult to consider inpatient rehabilitation services. Patient transferred to CIR on 07/24/2013 .   Patient currently requires min with mobility secondary to muscle weakness, decreased cardiorespiratoy endurance and decreased  oxygen support and decreased standing balance, decreased postural control, decreased balance strategies and difficulty maintaining precautions. Prior to hospitalization, patient was independent with mobility and lived with Spouse in a House home. Home access is 3-4Stairs to enter.   Pt presents with decreased activity tolerance, decreased oxygen support, decreased functional mobility, decreased balance, decreased safety, difficulty following precautions Limiting pt's independence with leisure/community pursuits.   Leisure History/Participation Premorbid leisure interest/current participation: Games - Word-search;Community - Financial controller - Exercise (Comment);Community - Journalist, newspaper - Travel (Comment) (walking at [employer]) Expression Interests: Music (Comment) (concerts) Other Leisure Interests: Reading Leisure Participation Style: With Family/Friends Awareness of Community Resources: Good-identify 3 post discharge leisure resources Psychosocial / Spiritual Social interaction - Mood/Behavior: Cooperative Firefighter Appropriate for Education?: Yes Recreational Therapy Orientation Orientation -Reviewed with patient: Available activity resources Strengths/Weaknesses Patient Strengths/Abilities: Willingness to participate Patient weaknesses: Physical limitations TR Patient demonstrates impairments in the following area(s): Endurance;Safety TR Additional Impairment(s): None  Plan Rec Therapy Plan Is patient appropriate for Therapeutic Recreation?: Yes Rehab Potential: Good Treatment times per week: Min 1 time per week >20 minutes Estimated Length of Stay: 2 weeks TR Treatment/Interventions: Adaptive equipment instruction;1:1 session;Balance/vestibular training;Functional mobility training;Community reintegration;Patient/family education;Therapeutic activities;Recreation/leisure participation;Therapeutic exercise  Recommendations for other services:  None  Discharge Criteria: Patient will be discharged from TR if patient refuses treatment 3 consecutive times without medical reason.  If treatment goals not met, if there is a change in medical status, if patient makes no progress towards goals or if patient is discharged from hospital.  The above assessment, treatment plan, treatment alternatives and goals were discussed and mutually agreed upon: by patient  Trygg Mantz 07/29/2013, 3:27 PM

## 2013-07-29 NOTE — Progress Notes (Signed)
Occupational Therapy Session Note  Patient Details  Name: [name] MRN: [id number] Date of Birth: Mar 14, 1932  Today's Date: 07/29/2013 Time: 1478-2956 Time Calculation (min): 55 min  Short Term Goals: Week 1:  OT Short Term Goal 1 (Week 1): Patient will complete UB/LB bathing with supervision OT Short Term Goal 2 (Week 1): Patient will complete UB/LB dressing with supervision OT Short Term Goal 3 (Week 1): Patient will perform toielt transfer using DME prn with supervision OT Short Term Goal 4 (Week 1): Patient will perform tub/shower transfer using DME prn with supervision OT Short Term Goal 5 (Week 1): Patient will independent adhere to sternal precautions during ADL routine  Skilled Therapeutic Interventions/Progress Updates:  Patient found seated eob with complaints of pain in back and head; 6/10. RN made aware of pain. Patient ambulated from eob-> bathroom for toilet transfer, toileting, and shower stall transfer. Patient performed ADL retraining at shower level in sit<>stand position. Patient with poor safety awareness during functional mobility; requiring occasional steady assist and min verbal cues. After shower, patient ambulated back to bed for UB/LB dressing in sit<>stand position. Patient ambulated to sink after dressing for grooming tasks. Patient required min verbal cues in order to adhere/remember her sternal precautions. Skilled intervention focusing on functional mobility/ambulation using RW, safety with RW, toilet transfer, shower stall transfer, overall activity tolerance/endurance, ADL retraining, and adherence of sternal precautions. Patient on 3 liters of 02 via nasal cannula throughout session. At end of session, left patient seated in w/c at sink to finish with grooming tasks.  Precautions:  Precautions Precautions: Sternal;Fall Precaution Comments: cues for precautions functionally Restrictions Weight Bearing Restrictions: No  See FIM for current functional  status  Therapy/Group: Individual Therapy  [name] 07/29/2013, 10:31 AM

## 2013-07-29 NOTE — Patient Care Conference (Signed)
Inpatient RehabilitationTeam Conference and Plan of Care Update Date: 07/29/2013   Time: 2:15 PM    Patient Name: [name]      Medical Record Number: [id number]  Date of Birth: [birth year] Sex: Female         Room/Bed: 4M01C/4M01C-01 Payor Info: Payor: [employer] [place] MEDICARE / Plan: BLUE MEDICARE / Product Type: *No Product type* /    Admitting Diagnosis: AVR, CABG Deconditioned   Admit Date/Time:  07/24/2013 12:53 PM Admission Comments: No comment available   Primary Diagnosis:  Physical deconditioning Principal Problem: Physical deconditioning  Patient Active Problem List   Diagnosis Date Noted  . Physical deconditioning 07/24/2013  . S/P AVR (aortic valve replacement) 07/23/2013  . S/P CABG x 1 07/23/2013  . Aortic stenosis 06/10/2013  . Dyspnea 06/10/2013  . Essential hypertension 06/10/2013  . Hyperlipidemia 06/10/2013  . COPD (chronic obstructive pulmonary disease) 10/26/2011    Expected Discharge Date: Expected Discharge Date: 08/05/13  Team Members Present: Physician leading conference: Dr. Faith Rogue Social Worker Present: Amada Jupiter, LCSW Nurse Present: Other (comment) Tennis Must, RN) PT Present: [name], PT OT Present: Mackie Pai, OT;Patricia Mat Carne, OT SLP Present: Feliberto Gottron, SLP Other (Discipline and Name): Royden Purl, RT PPS Coordinator present : Tora Duck, RN, CRRN     Current Status/Progress Goal Weekly Team Focus  Medical   cad/AS. some anxiety issues. some safety issues seen  increase safety, adherence to chest precautions  safety, increase pulmonary stamina, wean oxygen   Bowel/Bladder   Continent of bowel and bladder  Remain continent of bowel and bladder  Monitor    Swallow/Nutrition/ Hydration             ADL's   overall supervision->occasional steady assist  overall mod I  ADL retraining, adhering to sternal precautions, overall activity tolerance/endurance, functional transfers, dynamic standing    Mobility   S/min A overall for transfers and ambulation; very poor activity tolerance  mod I to S overall;   endurance/activity tolerance, functional strengthening, adherence to sternal precautions during functional tasks   Communication             Safety/Cognition/ Behavioral Observations            Pain   No c/o pain         Skin   Mid chest incision open to air, steri-strips, bruise to right arm  No new skin breakdown or infection  Monitor skin q shift and prn    Rehab Goals Patient on target to meet rehab goals: Yes *See Care Plan and progress notes for long and short-term goals.  Barriers to Discharge: safety awareness    Possible Resolutions to Barriers:  supervision at home    Discharge Planning/Teaching Needs:  home with husband who is able to provide 24/7 assistance (but has mod i - supervision goals)      Team Discussion:  Doing well overall and anticipating supervision goals being met overall.  Do feel she will likely d/c on O2.  Very unsafe with walker - unaware of rw stability and enviroment.    Revisions to Treatment Plan:  None   Continued Need for Acute Rehabilitation Level of Care: The patient requires daily medical management by a physician with specialized training in physical medicine and rehabilitation for the following conditions: Daily direction of a multidisciplinary physical rehabilitation program to ensure safe treatment while eliciting the highest outcome that is of practical value to the patient.: Yes Daily medical management  of patient stability for increased activity during participation in an intensive rehabilitation regime.: Yes Daily analysis of laboratory values and/or radiology reports with any subsequent need for medication adjustment of medical intervention for : Cardiac problems;Pulmonary problems;Post surgical problems;Other  [name] 07/29/2013, 4:13 PM

## 2013-07-29 NOTE — Progress Notes (Signed)
Physical Therapy Session Note  Patient Details  Name: [name] MRN: [id number] Date of Birth: [birth year]  Today's Date: 07/29/2013 Time: 2130-8657 Time Calculation (min): 53 min  Short Term Goals: Week 1:  PT Short Term Goal 1 (Week 1): Pt will perform functional transfers with S PT Short Term Goal 2 (Week 1): Pt will perform bed mobility with S while maintaining sternal precautions PT Short Term Goal 3 (Week 1): Pt will be able to gait x 29' with S  Skilled Therapeutic Interventions/Progress Updates:   Session focused on outdoor community ambulation on uneven surfaces, education on energy conservation techniques, safety with RW (frequent cueing needed), and overall activity tolerance. Pt appears to be unaware when being unsafe with RW and mobility even when therapist pointed it out to patient. Poor carryover noted when encountering similar obstacles. Recommending to downgrade goals to S due to safety/cueing needed.  Therapy Documentation Precautions:  Precautions Precautions: Sternal;Fall Precaution Comments: cues for precautions functionally Restrictions Weight Bearing Restrictions: No Pain: Denies pain. Locomotion : Ambulation Ambulation/Gait Assistance: 4: Min assist   See FIM for current functional status  Therapy/Group: Individual Therapy and Co-Treatment with Recreational Therapy  [name] [facility] 07/29/2013, 2:00 PM

## 2013-07-29 NOTE — Progress Notes (Signed)
Social Work Patient ID: [name], female   DOB: December 18, 1931, 79 y.o.   MRN: [id number]  Met with patient and husband this afternoon to review team conference.  Both aware and agreeable with targeted d/c date of 11/18 @ supervision goals.  Plan for husband to attend family education on 11/17 am.  Pt pleased with progress.  Both deny any concerns at this time.  Continue to follow.  Demyan Fugate, LCSW

## 2013-07-29 NOTE — Progress Notes (Signed)
Upon entering patient's room pt. Was found on RA with SAT's of 71%. Pt. Was placed on 5L [place]. SAT's are now 95%. Will make RN aware of this. Pt. Was made aware not to take O2 off.

## 2013-07-29 NOTE — Progress Notes (Signed)
Physical Therapy Session Note  Patient Details  Name: [name] MRN: [id number] Date of Birth: Mar 05, 1932  Today's Date: 07/29/2013 Time: 1130-1200 session one            1420-1505  Session two Time Calculation (min): 30 min session one                                              45 min session two  Short Term Goals: Week 1:  PT Short Term Goal 1 (Week 1): Pt will perform functional transfers with S PT Short Term Goal 2 (Week 1): Pt will perform bed mobility with S while maintaining sternal precautions PT Short Term Goal 3 (Week 1): Pt will be able to gait x 61' with S  Skilled Therapeutic Interventions/Progress Updates:   Pt seen in two sessions today:  Session One:   Pt received sitting EOB, able to recall sternal precautions with 90 % accuracy, reports no pain at present. Gait training with 2 ww with use of portable O2 at 2 L 125' x 2 with seated rest and deep breathing techniques as pt displayed mild SOB, O2 SATS below. Seated B LE therex with vcs for proper technique  2 x 10 reps to increase strength and activity tolerance.   Session two:   Pt received sitting in w/c. Pt performed gait training to and from gym 125' x 2 with rest in between, decreased speed on way back to room due to fatigue with pt requiring increased vcs to improve floor clearance, step length and upright posture in RW. Pt performed transfer training on various surfaces to improve sit to stand and adherence to sternal precautions.  Pt performed nu step B LEs only at level 2 x 5 mins with attempting to have pt maintain a MET level of 1.8, pt required 2 rest periods to complete 5 mins. Functional mobility and toilet transfer with pt requiring supervision assist.   Therapy Documentation Precautions:  Precautions Precautions: Sternal;Fall Precaution Comments: cues for precautions functionally Restrictions Weight Bearing Restrictions: No    Vital Signs: SpO2 % 95 on 2L of O2 both sessions HR: 70 bpm both  sessions   Pain: Pain Assessment Pain Assessment: 0-10 Pain Score: 2 both sessions Pain Type: Surgical pain Pain Location: Chest Pain Orientation: Mid Pain Descriptors / Indicators: Aching Pain Frequency: Intermittent Pain Onset: Gradual Patients Stated Pain Goal: 2 Pain Intervention(s): Medication (See eMAR)    Locomotion : Ambulation Ambulation/Gait Assistance: 4: Min guard             See FIM for current functional status  Therapy/Group: Individual Therapy both sessions  Jackelyn Knife 07/29/2013, 12:11 PM

## 2013-07-30 ENCOUNTER — Inpatient Hospital Stay (HOSPITAL_COMMUNITY): Payer: Medicare Other | Admitting: Occupational Therapy

## 2013-07-30 ENCOUNTER — Inpatient Hospital Stay (HOSPITAL_COMMUNITY): Payer: Medicare Other | Admitting: *Deleted

## 2013-07-30 ENCOUNTER — Inpatient Hospital Stay (HOSPITAL_COMMUNITY): Payer: Medicare Other

## 2013-07-30 ENCOUNTER — Inpatient Hospital Stay (HOSPITAL_COMMUNITY): Payer: Medicare Other | Admitting: Physical Therapy

## 2013-07-30 DIAGNOSIS — I080 Rheumatic disorders of both mitral and aortic valves: Secondary | ICD-10-CM

## 2013-07-30 DIAGNOSIS — R5381 Other malaise: Secondary | ICD-10-CM

## 2013-07-30 DIAGNOSIS — J449 Chronic obstructive pulmonary disease, unspecified: Secondary | ICD-10-CM

## 2013-07-30 LAB — GLUCOSE, CAPILLARY
Glucose-Capillary: 126 mg/dL — ABNORMAL HIGH (ref 70–99)
Glucose-Capillary: 96 mg/dL (ref 70–99)
Glucose-Capillary: 130 mg/dL — ABNORMAL HIGH (ref 70–99)

## 2013-07-30 MED ORDER — ALBUTEROL SULFATE (5 MG/ML) 0.5% IN NEBU: 2.5000 mg | INHALATION_SOLUTION | Freq: Four times a day (QID) | RESPIRATORY_TRACT | Status: DC | PRN

## 2013-07-30 MED ORDER — IPRATROPIUM-ALBUTEROL 20-100 MCG/ACT IN AERS
2.0000 | INHALATION_SPRAY | Freq: Two times a day (BID) | RESPIRATORY_TRACT | Status: DC
  Administered 2013-07-30 – 2013-08-04 (×10): 2 via RESPIRATORY_TRACT

## 2013-07-30 MED ORDER — FUROSEMIDE 20 MG PO TABS
20.0000 mg | ORAL_TABLET | Freq: Two times a day (BID) | ORAL | Status: DC
  Administered 2013-07-30 – 2013-08-05 (×13): 20 mg via ORAL
  Filled 2013-07-30 (×16): qty 1

## 2013-07-30 NOTE — Progress Notes (Signed)
Recreational Therapy Session Note  Patient Details  Name: [name] MRN: [id number] Date of Birth: 06-22-1932 Today's Date: 07/30/2013 Time:  940-10  Pain: no c/o Skilled Therapeutic Interventions/Progress Updates: Session focused on obstacle negotiation in preparation for home & community ambulation using RW.  Pt requires min assist & mod verbal cues for safety. Pt using 3 L o2. Annel Zunker 07/30/2013, 12:56 PM

## 2013-07-30 NOTE — Progress Notes (Signed)
Subjective/Complaints: Feeling well overall. Still some orthopnea.   Objective: Vital Signs: Blood pressure 147/76, pulse 69, temperature 97.8 F (36.6 C), temperature source Oral, resp. rate 19, height 5' (1.524 m), weight 55.9 kg (123 lb 3.8 oz), SpO2 98.00%. Dg Chest 2 View  07/29/2013   CLINICAL DATA:  Status post CABG.  EXAM: CHEST  2 VIEW  COMPARISON:  07/24/2013  FINDINGS: Heart size is normal. The aortic valve replacement noted. Median sternotomy noted there is a small left pleural effusion, unchanged from the prior exam there is a trace right pleural effusion. There is no focal consolidation or pneumothorax.  IMPRESSION: No significant interval change compared with 07/24/2013 with a small persistent left pleural effusion.   Electronically Signed   By: Elige Ko   On: 07/29/2013 07:53    Recent Labs  07/29/13 0632  WBC 11.9*  HGB 8.8*  HCT 28.0*  PLT 276    Recent Labs  07/29/13 0632  NA 141  K 3.9  CL 103  GLUCOSE 132*  BUN 36*  CREATININE 1.27*  CALCIUM 9.0   CBG (last 3)   Recent Labs  07/29/13 1642 07/29/13 2043 07/30/13 0724  GLUCAP 129* 125* 126*    Wt Readings from Last 3 Encounters:  07/30/13 55.9 kg (123 lb 3.8 oz)  07/24/13 57.607 kg (127 lb)  07/24/13 57.607 kg (127 lb)    Physical Exam:  Constitutional: Patient was able to provide her name, place and date of birth. She had some difficulty recalling her full hospital course .She followed simple commands  Frail appearing.  HENT:  Head: Normocephalic.  Eyes: EOM are normal.  Neck: Normal range of motion. Neck supple. No thyromegaly present.  Cardiovascular:  Cardiac rate controlled with murmur Respiratory:  Decreased breath sounds at the bases.   appears comfortable. No wheezing. Comfortable at rest GI: Soft. Bowel sounds are normal. She exhibits no distension.  Neurological: She is alert.  No CN deficits. Generalized proximal greater than distal weakness. 3+ -->>4/5. No sensory  findings. dtr's 2+ at bilateral patellar and Achilles.  Skin:  Midline chest incision clean and dry  motor strength is 5/5 in the right upper, 4/5 in the left biceps otherwise 5/5  4/5 bilateral hip flexors knee extensors ankle dorsiflexors and plantar flexors  Sensation intact to light touch in bilateral upper and lower limbs   Assessment/Plan: 1. Functional deficits secondary to deconditioning due to AS and multiple medical,  AVR/CABG which require 3+ hours per day of interdisciplinary therapy in a comprehensive inpatient rehab setting. Physiatrist is providing close team supervision and 24 hour management of active medical problems listed below. Physiatrist and rehab team continue to assess barriers to discharge/monitor patient progress toward functional and medical goals. FIM: FIM - Bathing Bathing Steps Patient Completed: Chest;Right Arm;Left Arm;Abdomen;Front perineal area;Buttocks;Right upper leg;Left upper leg;Right lower leg (including foot);Left lower leg (including foot) Bathing: 5: Supervision: Safety issues/verbal cues  FIM - Upper Body Dressing/Undressing Upper body dressing/undressing steps patient completed: Thread/unthread right bra strap;Thread/unthread left bra strap;Thread/unthread right sleeve of pullover shirt/dresss;Thread/unthread left sleeve of pullover shirt/dress;Put head through opening of pull over shirt/dress;Pull shirt over trunk Upper body dressing/undressing: 4: Min-Patient completed 75 plus % of tasks FIM - Lower Body Dressing/Undressing Lower body dressing/undressing steps patient completed: Thread/unthread right underwear leg;Thread/unthread left underwear leg;Thread/unthread left pants leg;Don/Doff right sock;Don/Doff left sock;Don/Doff right shoe;Don/Doff left shoe;Pull underwear up/down;Pull pants up/down;Thread/unthread right pants leg Lower body dressing/undressing: 5: Supervision: Safety issues/verbal cues  FIM - Toileting Toileting steps completed  by patient: Adjust clothing prior to toileting;Performs perineal hygiene;Adjust clothing after toileting Toileting Assistive Devices: Grab bar or rail for support Toileting: 5: Supervision: Safety issues/verbal cues  FIM - Diplomatic Services operational officer Devices: Elevated toilet seat;Grab bars;Walker Toilet Transfers: 5-From toilet/BSC: Supervision (verbal cues/safety issues);5-To toilet/BSC: Supervision (verbal cues/safety issues)  FIM - Press photographer Assistive Devices: HOB elevated;Bed rails;Walker Bed/Chair Transfer: 5: Supine > Sit: Supervision (verbal cues/safety issues);4: Bed > Chair or W/C: Min A (steadying Pt. > 75%)  FIM - Locomotion: Wheelchair Locomotion: Wheelchair: 1: Total Assistance/staff pushes wheelchair (Pt<25%) FIM - Locomotion: Ambulation Locomotion: Ambulation Assistive Devices: Designer, industrial/product Ambulation/Gait Assistance: 4: Min assist Locomotion: Ambulation: 2: Travels 50 - 149 ft with minimal assistance (Pt.>75%)  Comprehension Comprehension Mode: Auditory Comprehension: 5-Follows basic conversation/direction: With no assist  Expression Expression Mode: Verbal Expression: 5-Expresses basic needs/ideas: With no assist  Social Interaction Social Interaction: 5-Interacts appropriately 90% of the time - Needs monitoring or encouragement for participation or interaction.  Problem Solving Problem Solving: 5-Solves complex 90% of the time/cues < 10% of the time  Memory Memory: 5-Recognizes or recalls 90% of the time/requires cueing < 10% of the time  Medical Problem List and Plan:  1. Deconditioning CAD/aortic stenosis/status post AVR/CABG 07/17/2013  2. DVT Prophylaxis/Anticoagulation: Subcutaneous Lovenox. Monitor platelet counts and any signs of bleeding  3. Pain Management: Ultram 50 mg every 6 hours as needed. Monitor with increased activity  4. Mood/depression. Zoloft 50 mg daily. Provide emotional support  5.  Neuropsych: This patient is capable of making decisions on her own behalf.  6. Acute blood loss anemia. hgb 8.5 7. Hypertension. Norvasc 5 mg daily, Lasix 20 mg daily, Avapro 75 mg daily and Lopressor 25 mg twice a day. Monitor with increased activity  8. History of asthma. Continue inhalers as directed.   Wean oxygen if able 9. GERD. Protonix  10. Hyperlipidemia. Zocor  11. Diabetes mellitus with peripheral neuropathy.  Hemoglobin A1c 6.6. Resume Glucophage as tolerated as prior to admission.  12. Left pleural effusion 13. Renal insufficiency:  Lasix held and resumed---increase to bid. Weights generally stable., I's and O's up a little. Still has some orthopnea. Pt notes some fluid on feet at end of day.   -bmet--recheck friday  -if reasonable may go up to 20mg  bid on lasix   LOS (Days) 6 A FACE TO FACE EVALUATION WAS PERFORMED  SWARTZ,ZACHARY T 07/30/2013 7:39 AM

## 2013-07-30 NOTE — Progress Notes (Signed)
Physical Therapy Note  Patient Details  Name: [name] MRN: [id number] Date of Birth: Mar 12, 1932 Today's Date: 07/30/2013  0930-1010 (40 minutes) individual Pain: no complaint of pain Focus of treatment : Gait training with RW ; therapeutic exercise focused on activity tolerance Treatment: Pt in wc upon arrival not wearing [place] for oxygen (oxygen sats 86% RA ; donned oxygen at 3 L min [place] ; transfers stand/turn SBA RW ; Nustep Level 4 X 7 minutes with one seated rest break; gait over obstacles with mod vcs for safety; gait - 120 feet min assist for balance with RW ( pt easily distracted from task); returned to room with all needs within reach.    1105-1135 (30 minutes) individual Pain: no reported pain  Other: Oxygen sats 90 % on 3 L  [place] (resting)  Focus of treatment: gait training ; therapeutic activities focused on static , dynamic standing balance Treatment: Pt requires reminder cues to lock wc brakes; sit to stand to RW close SBA; gait 80 feet RW min to close SBA with vcs to concentrate on task; standing with left or right LE on 4 inch step - pt required assist to prevent loss of balance; alternate stepping to 4 inch step - pt unable to maintain standing balance without UE assist on object. Returned to room with nurse call bell within reach.   Jayshawn Colston,JIM 07/30/2013, 10:07 AM

## 2013-07-30 NOTE — Progress Notes (Signed)
Occupational Therapy Session Note  Patient Details  Name: [name] MRN: [id number] Date of Birth: 09/06/1932  Today's Date: 07/30/2013 Time: 0730-0825 Time Calculation (min): 55 min  Short Term Goals: Week 1:  OT Short Term Goal 1 (Week 1): Patient will complete UB/LB bathing with supervision OT Short Term Goal 2 (Week 1): Patient will complete UB/LB dressing with supervision OT Short Term Goal 3 (Week 1): Patient will perform toielt transfer using DME prn with supervision OT Short Term Goal 4 (Week 1): Patient will perform tub/shower transfer using DME prn with supervision OT Short Term Goal 5 (Week 1): Patient will independent adhere to sternal precautions during ADL routine  Skilled Therapeutic Interventions/Progress Updates:  Patient found seated edge of bed with no complaints of pain. Patient stood with rolling walker (min verbal cues to adhere to sternal precautions) and ambulated into bathroom for toilet transfer, toileting, then shower stall transfer onto shower seat. Patient doffed clothes with supervision, then completed UB/LB bathing in sit<>stand position. Patient takes more than reasonable amount of time due to SOB. Patient remained on 3 liter of 02 throughout session, but still had some SOB with activity. Patient requires supervision due to decreased safety awareness and decreased awareness of her abilities/disabilites; patient's level of independence fluctuates from day to day (anywhere from close supervision to steady assist). Patient has difficult time managing 02 tubing during functional mobility/ambulation with RW.  After shower, patient ambulated back into room and completed UB/LB dressing in sit<>stand position edge of bed. Patient donned sports bra instead of regular bra per OT's recommendation in order to increase independence with UB dressing. After dressing, patient transferred into w/c and completed grooming tasks seated at sink in w/c. At end of session, left  patient at sink to finish grooming tasks with RN present giving medications; patient takes more than reasonable amount of time to complete her grooming routine.   Precautions:  Precautions Precautions: Sternal;Fall Precaution Comments: cues for precautions functionally Restrictions Weight Bearing Restrictions: No  See FIM for current functional status  Therapy/Group: Individual Therapy  [place] 07/30/2013, 8:29 AM

## 2013-07-30 NOTE — Progress Notes (Signed)
Physical Therapy Session Note  Patient Details  Name: [name] MRN: [id number] Date of Birth: 27-Apr-1932  Today's Date: 07/30/2013 Time: 1300-1355 Time Calculation (min): 55 min  Short Term Goals: Week 1:  PT Short Term Goal 1 (Week 1): Pt will perform functional transfers with S PT Short Term Goal 2 (Week 1): Pt will perform bed mobility with S while maintaining sternal precautions PT Short Term Goal 3 (Week 1): Pt will be able to gait x 81' with S  Skilled Therapeutic Interventions/Progress Updates:    Session focused on activity tolerance, gait, endurance, dynamic standing balance, and functional strengthening. Gait training with RW with emphasis on safety with AD, navigating obstacles (turns and stepping over simulated threshold) requiring intermittent steady A and cues for safe placement and use of RW. At one point pt left RW and started to walk without AD - educated on importance of always using RW for mobility. Dynamic standing balance neuro -red activities on compliant surface to work on balance reactions with 1 UE support (pt unable to do without UE support as losing balance posterior and mod A to recover) to reach for horseshoes and place overhead on basketball hoop and then pitch horseshoes - steady to min A overall. Nustep on level 6 x 4 min with LE's only limited due to fatigue.   3L O2 via [place] during session.  Therapy Documentation Precautions:  Precautions Precautions: Sternal;Fall Precaution Comments: cues for precautions functionally Restrictions Weight Bearing Restrictions: No   Pain:  Denies pain.   See FIM for current functional status  Therapy/Group: Individual Therapy  [name] [facility] 07/30/2013, 1:56 PM

## 2013-07-30 NOTE — Plan of Care (Signed)
Problem: RH SKIN INTEGRITY Goal: RH STG ABLE TO PERFORM INCISION/WOUND CARE W/ASSISTANCE STG Able To Perform Incision/Wound Care With mod Assistance.  Outcome: Progressing No dressing over incisions, just monitoring for redness and drainage

## 2013-07-31 ENCOUNTER — Inpatient Hospital Stay (HOSPITAL_COMMUNITY): Payer: Medicare Other

## 2013-07-31 ENCOUNTER — Inpatient Hospital Stay (HOSPITAL_COMMUNITY): Payer: Medicare Other | Admitting: Occupational Therapy

## 2013-07-31 LAB — GLUCOSE, CAPILLARY
Glucose-Capillary: 111 mg/dL — ABNORMAL HIGH (ref 70–99)
Glucose-Capillary: 174 mg/dL — ABNORMAL HIGH (ref 70–99)
Glucose-Capillary: 90 mg/dL (ref 70–99)

## 2013-07-31 NOTE — Progress Notes (Signed)
Recreational Therapy Session Note  Patient Details  Name: [name] MRN: [id number] Date of Birth: 09-16-1932 Today's Date: 07/31/2013  Pain: no c/o    Skilled Therapeutic Interventions/Progress Updates: Out & about to gift shop ambulatory level with close supervision/min assist & min cues for safety.    Cohen Boettner 07/31/2013, 3:16 PM

## 2013-07-31 NOTE — Progress Notes (Signed)
Physical Therapy Session Note  Patient Details  Name: [name] MRN: [id number] Date of Birth: Mar 06, 1932  Today's Date: 07/31/2013  Short Term Goals: Week 1:  PT Short Term Goal 1 (Week 1): Pt will perform functional transfers with S PT Short Term Goal 2 (Week 1): Pt will perform bed mobility with S while maintaining sternal precautions PT Short Term Goal 3 (Week 1): Pt will be able to gait x 50' with S  Session #1: Time: 7829-5621 Time Calculation (min): 55 min Pt c/o 6/10 back pain - RN notified for pain medication. Session focused on functional gait, balance,  and safety with RW in home environment while making up a bed, walking in/out of bathroom (completed toilet transfers), and furniture transfers. Emphasis on pt paying attention to O2 tubing as well with little awareness by patient. Pt requires mod to max VC for safe use of AD (leaves it off to the side, picks it up while walking, etc) and despite cueing and discussion, continues to be unsafe. Pt able to verbalize that she is unsafe but does not correct for it. Discussed at length with pt her high risk of fall at home if not using the RW and pointing out multiple LOB requiring physical A to recover. Discussed recommendation for husband to be with her at all times when walking/transferring and family education scheduled for Monday 11/17. Stair negotiation with S for home entry x 2 reps with bilateral rails (pt reports husband has started to work on the rails).  Session #2: Time: 1100-1130 (30 min) Denies pain. Session focused on community gait downstairs in gift shop practicing obstacle negotiation, dynamic reaching activities, and safety with AD. Pt close S/steady A with RW and cues for safety needed.   Therapy Documentation Precautions:  Precautions Precautions: Sternal;Fall Precaution Comments: cues for precautions functionally Restrictions Weight Bearing Restrictions: No   See FIM for current functional  status  Therapy/Group: Individual Therapy  [name] [facility] 07/31/2013, 9:56 AM

## 2013-07-31 NOTE — Plan of Care (Signed)
Problem: RH BOWEL ELIMINATION Goal: RH STG MANAGE BOWEL WITH ASSISTANCE STG Manage Bowel with min. Assistance.  Outcome: Progressing Incontinent x 1 today, pt did own hygiene and clothes management, depends, etc

## 2013-07-31 NOTE — Progress Notes (Signed)
Subjective/Complaints: Feeling well overall. Still on 3L of oxygen  Objective: Vital Signs: Blood pressure 135/77, pulse 70, temperature 98 F (36.7 C), temperature source Oral, resp. rate 18, height 5' (1.524 m), weight 106.1 kg (233 lb 14.5 oz), SpO2 96.00%. No results found.  Recent Labs  07/29/13 0632  WBC 11.9*  HGB 8.8*  HCT 28.0*  PLT 276    Recent Labs  07/29/13 0632  NA 141  K 3.9  CL 103  GLUCOSE 132*  BUN 36*  CREATININE 1.27*  CALCIUM 9.0   CBG (last 3)   Recent Labs  07/30/13 1652 07/30/13 2113 07/31/13 0722  GLUCAP 96 130* 111*    Wt Readings from Last 3 Encounters:  07/31/13 106.1 kg (233 lb 14.5 oz)  07/24/13 57.607 kg (127 lb)  07/24/13 57.607 kg (127 lb)    Physical Exam:  Constitutional: Patient was able to provide her name, place and date of birth. She had some difficulty recalling her full hospital course .She followed simple commands  Frail appearing.  HENT:  Head: Normocephalic.  Eyes: EOM are normal.  Neck: Normal range of motion. Neck supple. No thyromegaly present.  Cardiovascular:  Cardiac rate controlled with murmur Respiratory:  Decreased breath sounds at the bases.   appears comfortable. No wheezing. Comfortable at rest GI: Soft. Bowel sounds are normal. She exhibits no distension.  Neurological: She is alert.  No CN deficits. Generalized proximal greater than distal weakness. 3+ -->>4/5. No sensory findings. dtr's 2+ at bilateral patellar and Achilles.  Skin:  Midline chest incision clean and dry  motor strength is 5/5 in the right upper, 4/5 in the left biceps otherwise 5/5  4/5 bilateral hip flexors knee extensors ankle dorsiflexors and plantar flexors  Sensation intact to light touch in bilateral upper and lower limbs   Assessment/Plan: 1. Functional deficits secondary to deconditioning due to AS and multiple medical,  AVR/CABG which require 3+ hours per day of interdisciplinary therapy in a comprehensive inpatient  rehab setting. Physiatrist is providing close team supervision and 24 hour management of active medical problems listed below. Physiatrist and rehab team continue to assess barriers to discharge/monitor patient progress toward functional and medical goals. FIM: FIM - Bathing Bathing Steps Patient Completed: Chest;Right Arm;Left Arm;Abdomen;Front perineal area;Buttocks;Right upper leg;Left upper leg;Right lower leg (including foot);Left lower leg (including foot) Bathing: 5: Supervision: Safety issues/verbal cues  FIM - Upper Body Dressing/Undressing Upper body dressing/undressing steps patient completed: Thread/unthread right bra strap;Thread/unthread left bra strap;Thread/unthread right sleeve of pullover shirt/dresss;Thread/unthread left sleeve of pullover shirt/dress;Put head through opening of pull over shirt/dress;Pull shirt over trunk Upper body dressing/undressing: 5: Supervision: Safety issues/verbal cues FIM - Lower Body Dressing/Undressing Lower body dressing/undressing steps patient completed: Thread/unthread right underwear leg;Thread/unthread left underwear leg;Thread/unthread left pants leg;Don/Doff right sock;Don/Doff left sock;Don/Doff right shoe;Don/Doff left shoe;Pull underwear up/down;Pull pants up/down;Thread/unthread right pants leg Lower body dressing/undressing: 5: Supervision: Safety issues/verbal cues  FIM - Toileting Toileting steps completed by patient: Adjust clothing prior to toileting;Performs perineal hygiene;Adjust clothing after toileting Toileting Assistive Devices: Grab bar or rail for support Toileting: 5: Supervision: Safety issues/verbal cues  FIM - Diplomatic Services operational officer Devices: Elevated toilet seat;Grab bars;Walker Toilet Transfers: 4-To toilet/BSC: Min A (steadying Pt. > 75%);4-From toilet/BSC: Min A (steadying Pt. > 75%)  FIM - Banker Devices: [facility] elevated;Walker Bed/Chair Transfer: 5:  Supine > Sit: Supervision (verbal cues/safety issues);5: Bed > Chair or W/C: Supervision (verbal cues/safety issues)  FIM - Locomotion: Wheelchair Locomotion: Wheelchair: 1:  Total Assistance/staff pushes wheelchair (Pt<25%) FIM - Locomotion: Ambulation Locomotion: Ambulation Assistive Devices: Walker - Rolling Ambulation/Gait Assistance: 4: Min guard Locomotion: Ambulation: 2: Travels 50 - 149 ft with minimal assistance (Pt.>75%)  Comprehension Comprehension Mode: Auditory Comprehension: 5-Follows basic conversation/direction: With extra time/assistive device  Expression Expression Mode: Verbal Expression: 5-Expresses basic needs/ideas: With extra time/assistive device  Social Interaction Social Interaction: 5-Interacts appropriately 90% of the time - Needs monitoring or encouragement for participation or interaction.  Problem Solving Problem Solving: 5-Solves basic 90% of the time/requires cueing < 10% of the time  Memory Memory: 5-Recognizes or recalls 90% of the time/requires cueing < 10% of the time  Medical Problem List and Plan:  1. Deconditioning CAD/aortic stenosis/status post AVR/CABG 07/17/2013  2. DVT Prophylaxis/Anticoagulation: Subcutaneous Lovenox. Monitor platelet counts and any signs of bleeding  3. Pain Management: Ultram 50 mg every 6 hours as needed. Monitor with increased activity  4. Mood/depression. Zoloft 50 mg daily. Provide emotional support  5. Neuropsych: This patient is capable of making decisions on her own behalf.  6. Acute blood loss anemia. hgb 8.5 7. Hypertension. Norvasc 5 mg daily, Lasix 20 mg daily, Avapro 75 mg daily and Lopressor 25 mg twice a day. Monitor with increased activity  8. History of asthma. Continue inhalers as directed.   Wean oxygen if able 9. GERD. Protonix  10. Hyperlipidemia. Zocor  11. Diabetes mellitus with peripheral neuropathy.  Hemoglobin A1c 6.6. Resume Glucophage as tolerated as prior to admission.  12. Left pleural  effusion: check cxr friday 13. Renal insufficiency:  Lasix held and resumed---increase to bid. Weights generally stable., I's and O's up a little. Still has some orthopnea. Pt notes some fluid on feet at end of day.   -bmet--recheck friday  -lasix increased to 20mg  bid   LOS (Days) 7 A FACE TO FACE EVALUATION WAS PERFORMED  [name] T 07/31/2013 7:56 AM

## 2013-07-31 NOTE — Progress Notes (Signed)
Occupational Therapy Session Notes   Patient Details  Name: [name] MRN: [id number] Date of Birth: 1932-08-05  Today's Date: 07/31/2013  Short Term Goals: Week 1:  OT Short Term Goal 1 (Week 1): Patient will complete UB/LB bathing with supervision OT Short Term Goal 2 (Week 1): Patient will complete UB/LB dressing with supervision OT Short Term Goal 3 (Week 1): Patient will perform toielt transfer using DME prn with supervision OT Short Term Goal 4 (Week 1): Patient will perform tub/shower transfer using DME prn with supervision OT Short Term Goal 5 (Week 1): Patient will independent adhere to sternal precautions during ADL routine  Skilled Therapeutic Interventions/Progress Updates:   Session #1 [id number] - 60 Minutes Individual Therapy No complaints of pain Patient found seated edge of bed upon entering room. Patient ambulated from edge of bed -> shower stall with close supervision, patient doffed clothes with close supervision and encouragement to complete this task on her own. Patient did have some complaints of pain when doffing gown, therapist taught & encouraged a different way to doff gown. Patient attempted to ambulate to throw away depends without RW, therapist had to provide steady assist and explain to patient importance of using RW for all functional mobility/ambulation for safety. From here, patient completed ADL retraining at shower level in sit<>stand position, using grab bars prn. Patient ambulated out of shower to sit edge of bed in order to complete UB/LB dressing. During functional mobility using RW, patient unable to safely manage oxygen tubing; rolling over tubing with her RW and stepping on top of tubing. Even with verbal cues, patient was not able to safely manage. After dressing tasks, patient ambulated to sink in order to complete grooming tasks while seated in w/c; RN present giving meds at end of session.  Session #2 [id number] - 40 Minutes Individual  Therapy No complaints of pain Patient found seated in w/c. Patient with request to use bathroom. Patient ambulated into bathroom with steady assist from therapist, performed toilet transfer and toileting with supervision. Patient then ambulated out of bathroom with steady assist for grooming tasks at sink, then ambulated -> ADL apartment. For longer distance ambulation patient requires consistent min assist secondary to poor RW awareness and poor overall safety awareness. Once in ADL apartment, patient engaged in furniture transfer on/off couch, tub/shower transfer on/off tub transfer bench (using sit->slide technique and stepping over tub threshold), and bed mobility. Patient ambulated back to room and sat on edge of bed. Left patient seated edge of bed with needed items within reach.   Precautions:  Precautions Precautions: Sternal;Fall Precaution Comments: cues for precautions functionally Restrictions Weight Bearing Restrictions: No  See FIM for current functional status  [name] 07/31/2013, 7:19 AM

## 2013-08-01 ENCOUNTER — Inpatient Hospital Stay (HOSPITAL_COMMUNITY): Payer: Medicare Other | Admitting: Occupational Therapy

## 2013-08-01 ENCOUNTER — Inpatient Hospital Stay (HOSPITAL_COMMUNITY): Payer: Medicare Other

## 2013-08-01 LAB — BASIC METABOLIC PANEL
BUN: 38 mg/dL — ABNORMAL HIGH (ref 6–23)
CO2: 26 mEq/L (ref 19–32)
Calcium: 8.9 mg/dL (ref 8.4–10.5)
Creatinine, Ser: 1.57 mg/dL — ABNORMAL HIGH (ref 0.50–1.10)
GFR calc non Af Amer: 30 mL/min — ABNORMAL LOW (ref >90)
Glucose, Bld: 118 mg/dL — ABNORMAL HIGH (ref 70–99)

## 2013-08-01 LAB — GLUCOSE, CAPILLARY
Glucose-Capillary: 110 mg/dL — ABNORMAL HIGH (ref 70–99)
Glucose-Capillary: 143 mg/dL — ABNORMAL HIGH (ref 70–99)

## 2013-08-01 NOTE — Progress Notes (Signed)
Subjective/Complaints: Had a reasonable night. A little anxious this am.   Objective: Vital Signs: Blood pressure 135/75, pulse 70, temperature 98.1 F (36.7 C), temperature source Oral, resp. rate 17, height 5' (1.524 m), weight 56.972 kg (125 lb 9.6 oz), SpO2 94.00%. No results found. No results found for this basename: WBC, HGB, HCT, PLT,  in the last 72 hours  Recent Labs  08/01/13 0520  NA 146*  K 4.3  CL 104  GLUCOSE 118*  BUN 38*  CREATININE 1.57*  CALCIUM 8.9   CBG (last 3)   Recent Labs  07/31/13 1628 07/31/13 2134 08/01/13 0724  GLUCAP 90 129* 113*    Wt Readings from Last 3 Encounters:  08/01/13 56.972 kg (125 lb 9.6 oz)  07/24/13 57.607 kg (127 lb)  07/24/13 57.607 kg (127 lb)    Physical Exam:  Constitutional: Patient was able to provide her name, place and date of birth. She had some difficulty recalling her full hospital course .She followed simple commands  Frail appearing.  HENT:  Head: Normocephalic.  Eyes: EOM are normal.  Neck: Normal range of motion. Neck supple. No thyromegaly present.  Cardiovascular:  Cardiac rate controlled with murmur Respiratory:  Decreased breath sounds at the bases.   appears comfortable. No wheezing. Comfortable at rest GI: Soft. Bowel sounds are normal. She exhibits no distension.  Neurological: She is alert.  No CN deficits. Strength 4- prox to 4/5 distally . No sensory findings. dtr's 2+ at bilateral patellar and Achilles.  Skin:  Midline chest incision clean and dry, healing nicely motor strength is 5/5 in the right upper, 4/5 in the left biceps otherwise 5/5  4/5 bilateral hip flexors knee extensors ankle dorsiflexors and plantar flexors  Sensation intact to light touch in bilateral upper and lower limbs   Assessment/Plan: 1. Functional deficits secondary to deconditioning due to AS and multiple medical,  AVR/CABG which require 3+ hours per day of interdisciplinary therapy in a comprehensive inpatient rehab  setting. Physiatrist is providing close team supervision and 24 hour management of active medical problems listed below. Physiatrist and rehab team continue to assess barriers to discharge/monitor patient progress toward functional and medical goals. FIM: FIM - Bathing Bathing Steps Patient Completed: Chest;Right Arm;Left Arm;Abdomen;Front perineal area;Buttocks;Right upper leg;Left upper leg;Right lower leg (including foot);Left lower leg (including foot) Bathing: 5: Supervision: Safety issues/verbal cues  FIM - Upper Body Dressing/Undressing Upper body dressing/undressing steps patient completed: Thread/unthread right bra strap;Thread/unthread left bra strap;Thread/unthread right sleeve of pullover shirt/dresss;Thread/unthread left sleeve of pullover shirt/dress;Put head through opening of pull over shirt/dress;Pull shirt over trunk Upper body dressing/undressing: 5: Supervision: Safety issues/verbal cues FIM - Lower Body Dressing/Undressing Lower body dressing/undressing steps patient completed: Thread/unthread right underwear leg;Thread/unthread left underwear leg;Thread/unthread left pants leg;Don/Doff right sock;Don/Doff left sock;Don/Doff right shoe;Don/Doff left shoe;Pull underwear up/down;Pull pants up/down;Thread/unthread right pants leg Lower body dressing/undressing: 5: Supervision: Safety issues/verbal cues  FIM - Toileting Toileting steps completed by patient: Adjust clothing prior to toileting;Performs perineal hygiene;Adjust clothing after toileting Toileting Assistive Devices: Grab bar or rail for support Toileting: 5: Supervision: Safety issues/verbal cues  FIM - Diplomatic Services operational officer Devices: Elevated toilet seat;Grab bars;Walker Toilet Transfers: 4-To toilet/BSC: Min A (steadying Pt. > 75%);4-From toilet/BSC: Min A (steadying Pt. > 75%)  FIM - Bed/Chair Transfer Bed/Chair Transfer Assistive Devices: Therapist, occupational: 5: Bed > Chair or W/C:  Supervision (verbal cues/safety issues);5: Chair or W/C > Bed: Supervision (verbal cues/safety issues)  FIM - Locomotion: Wheelchair Locomotion: Wheelchair: 1: Total  Assistance/staff pushes wheelchair (Pt<25%) FIM - Locomotion: Ambulation Locomotion: Ambulation Assistive Devices: Walker - Rolling Ambulation/Gait Assistance: 4: Min guard Locomotion: Ambulation: 2: Travels 50 - 149 ft with minimal assistance (Pt.>75%)  Comprehension Comprehension Mode: Auditory Comprehension: 5-Understands complex 90% of the time/Cues < 10% of the time  Expression Expression Mode: Verbal Expression: 5-Expresses basic needs/ideas: With extra time/assistive device  Social Interaction Social Interaction: 5-Interacts appropriately 90% of the time - Needs monitoring or encouragement for participation or interaction.  Problem Solving Problem Solving: 5-Solves basic 90% of the time/requires cueing < 10% of the time  Memory Memory: 5-Recognizes or recalls 90% of the time/requires cueing < 10% of the time  Medical Problem List and Plan:  1. Deconditioning CAD/aortic stenosis/status post AVR/CABG 07/17/2013  2. DVT Prophylaxis/Anticoagulation: Subcutaneous Lovenox. No adverse reactions  3. Pain Management: Ultram 50 mg every 6 hours as needed. Pain controlled 4. Mood/depression. Zoloft 50 mg daily. Provide emotional support  5. Neuropsych: This patient is capable of making decisions on her own behalf.  6. Acute blood loss anemia. hgb 8.5 7. Hypertension. Norvasc 5 mg daily, Lasix 20 mg daily, Avapro 75 mg daily and Lopressor 25 mg twice a day. Monitor with increased activity  8. History of asthma. Continue inhalers as directed.   Wean oxygen as able 9. GERD. Protonix  10. Hyperlipidemia. Zocor  11. Diabetes mellitus with peripheral neuropathy.  Hemoglobin A1c 6.6. Resume Glucophage as tolerated as prior to admission.  12. Left pleural effusion: cxr pending today 13. Renal insufficiency:  Lasix held and  resumed---increase to bid. Weights generally stable., I's and O's up a little. Still has some orthopnea. Pt notes some fluid on feet at end of day.   -bmet--stable today  -lasix increased to 20mg  bid   LOS (Days) 8 A FACE TO FACE EVALUATION WAS PERFORMED  SWARTZ,ZACHARY T 08/01/2013 7:43 AM

## 2013-08-01 NOTE — Progress Notes (Signed)
Occupational Therapy Session Notes & Weekly Progress Note  Patient Details  Name: [name] MRN: [id number] Date of Birth: Feb 11, 1932  Today's Date: 08/01/2013  SESSION NOTES  Session #1 [phone] - 30 Minutes Individual Therapy No complaints of pain Patient missed 30 minutes secondary to off unit for XRay, plan to make up some time this pm Patient found seated edge of bed upon entering room. Therapist encouraged dressing now and shower this afternoon, patient agreed. Patient completed UB/LB dressing at edge of bed level in sit<>stand position. Patient was able to manage 02 tubing more independently, not relying on therapist to tell her when to doff/donn tubing. From here, patient ambulated to sink with RW(requiring min verbal cues to adhere to sternal precautions during sit>stand) and worked on grooming tasks seated at sink side in w/c.[name] At end of session, left patient seated at sink to complete grooming tasks. Patient takes more than reasonable amount of time to complete tasks, but presents safer this am.   Session #2 1330-1430 - 60 Minutes Individual Therapy No complaints of pain Patient found supine in bed. Patient engaged in bed mobility and stood with RW, then ambulated -> ADL apartment with steady assist. Patient engaged in toilet transfer and toileting, then sat on bed to doff clothes. Tub/shower transfer on/off tub transfer bench completed from here and patient performed ADL retraining at shower level in sit<>stand position. Min assist for tub/shower transfers secondary to loss of balance. Patient ambulated to bed to donn clothes after shower. Patient required minimal assistance to manage 02 tubing and in verbal cues to adhere to sternal precautions. Patient ambulated back to room after dressing and sat edge of bed. All needed items within reach. Patient on 2 liters of 02 via nasal cannula throughout entire session.    ------------------------------------------------------------------------------------------------------------------------------------  WEEKLY PROGRESS NOTE Patient is progress toward short term goals. Patient's LTGs recently downgraded to STG level (overall supervision). Originally, patient's LTGs were set to an overall mod I level; however due to patient's poor safety awareness and poor rolling walker awareness had to downgrade goals to an overall supervision level. Plan is for patient to discharge with 02 via nasal cannula and patient with difficult time managing 02 tubing during functional mobility and BADL tasks; patient does require supervision in order to manage this safely. Patient also requires min verbal cues (supervision) in order to adhere to sternal precautions during functional mobility and BADLs. No family has been present for education, yet. Patient states her husband plans to come in Monday from 10am-12pm. Therapist has discussed home set-up with patient and per patient report, her daughter has a shower seat she can use. Therapist recommends hand held shower as well as grab bars in her tub/shower unit. Patient states her toilet seat is "plenty high" and does not want a BSC even though therapist recommends one.   Patient continues to demonstrate the following deficits: poor general safety awareness, poor safety awareness using rolling walker, decreased overall activity tolerance/endurance due to decreased cardiovascular support and 02 support, decreased independence with BADLs/IADLs, decreased independence with functional mobility & functional transfers. Therefore, patient will continue to benefit from skilled OT intervention to enhance overall performance with BADL, iADL and Reduce care partner burden.  Patient progressing toward long term goals..  Continue plan of care.  OT Short Term Goals Week 1:  OT Short Term Goal 1 (Week 1): Patient will complete UB/LB bathing with supervision OT  Short Term Goal 1 - Progress (Week 1): Progressing toward goal OT Short  Term Goal 2 (Week 1): Patient will complete UB/LB dressing with supervision OT Short Term Goal 2 - Progress (Week 1): Progressing toward goal OT Short Term Goal 3 (Week 1): Patient will perform toielt transfer using DME prn with supervision OT Short Term Goal 3 - Progress (Week 1): Met OT Short Term Goal 4 (Week 1): Patient will perform tub/shower transfer using DME prn with supervision OT Short Term Goal 4 - Progress (Week 1): Progressing toward goal OT Short Term Goal 5 (Week 1): Patient will independent adhere to sternal precautions during ADL routine OT Short Term Goal 5 - Progress (Week 1): Progressing toward goal Week 2:  OT Short Term Goal 1 (Week 2): STGs = LTGs  Skilled Therapeutic Interventions/Progress Updates:  Balance/vestibular training;Community reintegration;Discharge planning;DME/adaptive equipment instruction;Functional mobility training;Pain management;Patient/family education;Psychosocial support;Self Care/advanced ADL retraining;Skin care/wound managment;Therapeutic Activities;Therapeutic Exercise;UE/LE Strength taining/ROM;UE/LE Coordination activities   Precautions:  Precautions Precautions: Sternal;Fall Precaution Comments: cues for precautions functionally Restrictions Weight Bearing Restrictions: No  Vital Signs: Therapy Vitals Temp: 98.1 F (36.7 C) Temp src: Oral Pulse Rate: 70 Resp: 17 BP: 135/75 mmHg Patient Position, if appropriate: Lying Oxygen Therapy SpO2: 94 % O2 Device: Nasal cannula O2 Flow Rate (L/min): 3 L/min  See FIM for current functional status  Keny Donald 08/01/2013, 7:56 AM

## 2013-08-01 NOTE — Progress Notes (Signed)
Physical Therapy Weekly Progress Note  Patient Details  Name: [name] MRN: [id number] Date of Birth: [birth year]  Today's Date: 08/01/2013  Patient has met 1 of 3 short term goals.  Pt's LTG's were downgraded this week to overall S due to poor safety awareness during mobility with RW and requiring frequent cueing to use AD and to use it appropriately. Pt continues to demonstrate decreased endurance and still requires 2 L of O2 to maintain O2 sats > 90% with activity. Pt difficulty managing oxygen tubing during mobility which is another reason pt will require close S for safety. Family education scheduled for Monday 11/17 with tentative d/c for 11/18 at S level.  Patient continues to demonstrate the following deficits: decreased endurance, decreased balance, decreased muscular endurance, decreased safety, decreased functional mobility, and decreased adherence to sternal precautions/memory and therefore will continue to benefit from skilled PT intervention to enhance overall performance with activity tolerance, balance, ability to compensate for deficits, awareness, coordination and knowledge of precautions.  Patient progressing toward long term goals..  Goals were downgraded this week to overall S for safety.  PT Short Term Goals Week 1:  PT Short Term Goal 1 (Week 1): Pt will perform functional transfers with S PT Short Term Goal 1 - Progress (Week 1): Partly met (not consistent; LTG downgraded to S) PT Short Term Goal 2 (Week 1): Pt will perform bed mobility with S while maintaining sternal precautions PT Short Term Goal 2 - Progress (Week 1): Met PT Short Term Goal 3 (Week 1): Pt will be able to gait x 50' with S PT Short Term Goal 3 - Progress (Week 1): Partly met (not consistent; LTG downgraded to S) Week 2:  PT Short Term Goal 1 (Week 2): = LTGs  Skilled Therapeutic Interventions/Progress Updates:  Ambulation/gait training;Balance/vestibular training;Community  reintegration;Discharge planning;Disease management/prevention;DME/adaptive equipment instruction;Functional mobility training;Neuromuscular re-education;Pain management;Patient/family education;Psychosocial support;Skin care/wound management;Stair training;Therapeutic Activities;Therapeutic Exercise;UE/LE Strength taining/ROM;UE/LE Coordination activities;Wheelchair propulsion/positioning;Cognitive remediation/compensation   Therapy Documentation Precautions:  Precautions Precautions: Sternal;Fall Precaution Comments: cues for precautions functionally Restrictions Weight Bearing Restrictions: No  See FIM for current functional status    [name] [facility] 08/01/2013, 12:10 PM

## 2013-08-01 NOTE — Progress Notes (Signed)
Physical Therapy Session Note  Patient Details  Name: [name] MRN: [id number] Date of Birth: August 28, 1932  Today's Date: 08/01/2013 Short Term Goals: Week 1:  PT Short Term Goal 1 (Week 1): Pt will perform functional transfers with S PT Short Term Goal 2 (Week 1): Pt will perform bed mobility with S while maintaining sternal precautions PT Short Term Goal 3 (Week 1): Pt will be able to gait x 50' with S  Session #1: Time: [phone] (56 min) Denies pain. Functional ambulation in the room with RW (cues needed for safety) with close S; gait down to therapy gym with S until turning to back up to sit down and required max A to recover due to LOB posterior. Introduced [place] HEP for strengthenign and balance including hip abduction, mini squats, hamstring curls, heel/toe raises and LAQ x 10 reps each BLE. Dynamic standing balance activity with numbered dots with 1 foot and then both feet to work on coordination and balance without UE support requiring min A. Gait back to room with intermittent steady A needed for balance during turns.  Session #2: Time: 1130-1205 (35 min) Denies pain. Pt with questions about positioning in sidelying for sleep; educated and tried positioning with pillows to which pt states it was comfortable and will try tonight with nursing staff. Gait with RW down to therapy gym with close S; cues for positioning of RW for safety. Nustep x 5 min on level 4 for functional endurance and strengthening of LE's. Toilet transfer/gait into bathroom back in room with steady A and positioned EOB for lunch.  Therapy Documentation Precautions:  Precautions Precautions: Sternal;Fall Precaution Comments: cues for precautions functionally Restrictions Weight Bearing Restrictions: No  See FIM for current functional status  Therapy/Group: Individual Therapy  [name] [facility] 08/01/2013, 12:08 PM

## 2013-08-01 NOTE — Progress Notes (Signed)
Nutrition Brief Note  Patient identified on the Malnutrition Screening Tool (MST) Report. Patient with stable weight. Eating well, and at her baseline. We reviewed adequate nutrition for when d/c home. Encouraged pt to maintain weight.  Wt Readings from Last 15 Encounters:  08/01/13 125 lb 9.6 oz (56.972 kg)  07/24/13 127 lb (57.607 kg)  07/24/13 127 lb (57.607 kg)  07/11/13 130 lb (58.968 kg)  07/07/13 127 lb (57.607 kg)  07/01/13 127 lb (57.607 kg)  06/27/13 127 lb (57.607 kg)  06/27/13 127 lb (57.607 kg)  06/20/13 127 lb (57.607 kg)  06/18/13 126 lb (57.153 kg)  06/10/13 128 lb (58.06 kg)  12/28/11 136 lb 3.2 oz (61.78 kg)  12/08/11 135 lb (61.236 kg)  11/24/11 135 lb (61.236 kg)  11/10/11 135 lb (61.236 kg)    Body mass index is 24.53 kg/(m^2). Patient meets criteria for normal weight based on current BMI.   Current diet order is Dysphagia 3, patient is consuming approximately 50% of meals at this time. Labs and medications reviewed.   No nutrition interventions warranted at this time. If nutrition issues arise, please consult RD.   Jarold Motto MS, RD, LDN Pager: [phone] After-hours pager: [phone]

## 2013-08-02 ENCOUNTER — Inpatient Hospital Stay (HOSPITAL_COMMUNITY): Payer: Medicare Other | Admitting: Physical Therapy

## 2013-08-02 DIAGNOSIS — I10 Essential (primary) hypertension: Secondary | ICD-10-CM

## 2013-08-02 LAB — GLUCOSE, CAPILLARY
Glucose-Capillary: 182 mg/dL — ABNORMAL HIGH (ref 70–99)
Glucose-Capillary: 90 mg/dL (ref 70–99)

## 2013-08-02 NOTE — Progress Notes (Signed)
[name] is a 78 y.o. female 28-Jul-1932 [id number]  Subjective: No new complaints. No new problems. Slept well. Feeling better.  Objective: Vital signs in last 24 hours: Temp:  [97.8 F (36.6 C)-98 F (36.7 C)] 97.8 F (36.6 C) (11/15 0630) Pulse Rate:  [58-74] 74 (11/15 0630) Resp:  [18] 18 (11/15 0630) BP: (114-127)/(54-69) 127/64 mmHg (11/15 0630) SpO2:  [92 %-96 %] 94 % (11/15 0630) Weight:  [118 lb 3.2 oz (53.615 kg)] 118 lb 3.2 oz (53.615 kg) (11/15 0630) Weight change: -7 lb 6.4 oz (-3.357 kg) Last BM Date: 07/31/13  Intake/Output from previous day: 11/14 0701 - 11/15 0700 In: 720 [P.O.:720] Out: 1 [Urine:1] Last cbgs: CBG (last 3)   Recent Labs  08/01/13 1625 08/01/13 2149 08/02/13 0730  GLUCAP 110* 143* 117*     Physical Exam General: No apparent distress  Finished eating breakfast  On O2 HEENT: moist mucosa Lungs: Normal effort. Lungs clear to auscultation, no crackles or wheezes. Cardiovascular: Regular rate and rhythm, no edema Abdomen: S/NT/ND; BS(+) Musculoskeletal:  No change from before: chest wall tender interiorly post-op Neurological: No new neurological deficits Wounds: N/A    Skin: clear Alert, cooperative   Lab Results: BMET    Component Value Date/Time   NA 146* 08/01/2013 0520   K 4.3 08/01/2013 0520   CL 104 08/01/2013 0520   CO2 26 08/01/2013 0520   GLUCOSE 118* 08/01/2013 0520   BUN 38* 08/01/2013 0520   CREATININE 1.57* 08/01/2013 0520   CALCIUM 8.9 08/01/2013 0520   GFRNONAA 30* 08/01/2013 0520   GFRAA 35* 08/01/2013 0520   CBC    Component Value Date/Time   WBC 11.9* 07/29/2013 0632   RBC 3.08* 07/29/2013 0632   HGB 8.8* 07/29/2013 0632   HCT 28.0* 07/29/2013 0632   PLT 276 07/29/2013 0632   MCV 90.9 07/29/2013 0632   MCH 28.6 07/29/2013 0632   MCHC 31.4 07/29/2013 0632   RDW 14.6 07/29/2013 0632   LYMPHSABS 2.2 07/25/2013 0520   MONOABS 1.2* 07/25/2013 0520   EOSABS 0.8* 07/25/2013 0520   BASOSABS 0.1  07/25/2013 0520    Studies/Results: Dg Chest 2 View  08/01/2013   CLINICAL DATA:  Shortness of breath.  EXAM: CHEST  2 VIEW  COMPARISON:  Chest radiograph of July 29, 2013 and June 22, 2011.  FINDINGS: Cardiomediastinal silhouette appears normal. Sternotomy wires are again noted. Status post aortic valve replacement. Stable minimal right pleural effusion. Stable mild left pleural effusion compared to prior exam. No pneumothorax is noted. There is seen persistent irregular density in the left midlung region which is stable compared to prior radiograph of 2012 and most likely represents benign scarring.  IMPRESSION: Stable minimal right pleural effusion. Stable mild left pleural effusion. Chronic findings as described above.   Electronically Signed   By: Roque Lias M.D.   On: 08/01/2013 07:58    Medications: I have reviewed the patient's current medications.  Assessment/Plan:   1. Deconditioning CAD/aortic stenosis/status post AVR/CABG 07/17/2013  2. DVT Prophylaxis/Anticoagulation: Subcutaneous Lovenox. Monitor platelet counts and any signs of bleeding  3. Pain Management: Ultram 50 mg every 6 hours as needed. Monitor with increased activity  4. Mood/depression. Zoloft 50 mg daily. Provide emotional support  5. Neuropsych: This patient is capable of making decisions on her own behalf.  6. Acute blood loss anemia. hgb 8.5  7. Hypertension. Norvasc 5 mg daily, Lasix 20 mg daily, Avapro 75 mg daily and Lopressor 25 mg twice a day. Monitor  with increased activity. Seems to be getting better 8. History of asthma. Continue inhalers as directed. Check oxygen saturations every shift.  9. GERD. Protonix  10. Hyperlipidemia. Zocor  11. Diabetes mellitus with peripheral neuropathy. Hemoglobin A1c 6.6. Resume Glucophage as tolerated as prior to admission.  12. Left pleural effusion will monitor for increasing shortness of breath.  13. Renal insufficiency: labs are better Cont Rx    Length of  stay, days: 9  Sonda Primes , MD 08/02/2013, 8:30 AM

## 2013-08-02 NOTE — Progress Notes (Signed)
Physical Therapy Session Note  Patient Details  Name: [name] MRN: [id number] Date of Birth: 30-Aug-1932  Today's Date: 08/02/2013 Time: 1100-1200 Time Calculation (min): 60 min  Short Term Goals: Week 2:  PT Short Term Goal 1 (Week 2): = LTGs  Skilled Therapeutic Interventions/Progress Updates:    Pt received semi-reclined in bed on 1.75L/min supplemental O2, [place]. Pt agreeable to therapy; denies pain. Session focused on functional mobility/ambulation in home setting while adhering to sternal precautions. Pt requires mod verbal cues for compliance with sternal precautions during initial supine>sit transfer. Gait 2x10' to/from bathroom with min guard, verbal cueing for safe/proper use of rolling walker (focus on maintain walker in closer proximity to body). Pt gave effective return demonstration for remainder of session. Supplemental O2 increased to 3L for all remaining activities secondary to SpO2 84% on 2L; RN notified. Gait x75', x80' with rolling walke, min guard for safety/balance. Car transfer with rolling walker and close supervision. In apartment, performed multiple stand-pivot transfers from couch<>bed with RW and multiple sit<>supine transfers on standard bed with subtle verbal cues to adhere to sternal precautions.  When seated EOB, therapist verbally explained and demonstrated R/L scooting without UE's/ performed gait x25' in home environment with RW and min guard. Pt gave inconsistent return demonstration for remainder of transfers. Session ended in pt room, with pt seated EOB with bed alarm on and all needs within reach. Supplemental O2 decreased to 2L/min with SpO2 93%, HR 84. RN notified.  Therapy Documentation Precautions:  Precautions Precautions: Sternal;Fall Precaution Comments: cues for precautions functionally Restrictions Weight Bearing Restrictions: No Vital Signs: Oxygen Therapy SpO2: 92 % O2 Device: None (Room air) Pain: Pain Assessment Pain Assessment:  No/denies pain Pain Score: 0-No pain Locomotion : Ambulation Ambulation/Gait Assistance: 4: Min guard   See FIM for current functional status  Therapy/Group: Individual Therapy  Hobble, Lorenda Ishihara 08/02/2013, 12:07 PM

## 2013-08-03 ENCOUNTER — Inpatient Hospital Stay (HOSPITAL_COMMUNITY): Payer: Medicare Other | Admitting: Physical Therapy

## 2013-08-03 ENCOUNTER — Inpatient Hospital Stay (HOSPITAL_COMMUNITY): Payer: Medicare Other | Admitting: *Deleted

## 2013-08-03 LAB — GLUCOSE, CAPILLARY
Glucose-Capillary: 152 mg/dL — ABNORMAL HIGH (ref 70–99)
Glucose-Capillary: 128 mg/dL — ABNORMAL HIGH (ref 70–99)
Glucose-Capillary: 135 mg/dL — ABNORMAL HIGH (ref 70–99)

## 2013-08-03 NOTE — Progress Notes (Signed)
[name] is a 79 y.o. female [birth year] [id number]  Subjective: No new complaints.  Slept well. Feeling better.  Objective: Vital signs in last 24 hours: Temp:  [98.1 F (36.7 C)] 98.1 F (36.7 C) (11/16 0515) Pulse Rate:  [69-87] 87 (11/16 0515) Resp:  [20] 20 (11/16 0515) BP: (109-152)/(70-83) 150/71 mmHg (11/16 0515) SpO2:  [92 %-93 %] 92 % (11/16 0515) Weight:  [118 lb 9.6 oz (53.797 kg)] 118 lb 9.6 oz (53.797 kg) (11/16 0515) Weight change: 6.4 oz (0.181 kg) Last BM Date: 08/02/13  Intake/Output from previous day: 11/15 0701 - 11/16 0700 In: 960 [P.O.:960] Out: -  Last cbgs: CBG (last 3)   Recent Labs  08/02/13 1649 08/02/13 2106 08/03/13 0725  GLUCAP 90 152* 128*     Physical Exam General: No apparent distress  Finished eating breakfast  On O2 HEENT: moist mucosa Lungs: Normal effort. Lungs clear to auscultation, no crackles or wheezes. Cardiovascular: Regular rate and rhythm, no edema Abdomen: S/NT/ND; BS(+) Musculoskeletal:  No change from before: chest wall tender interiorly post-op Neurological: No new neurological deficits Wounds: N/A    Skin: clear Alert, oriented, cooperative   Lab Results: BMET    Component Value Date/Time   NA 146* 08/01/2013 0520   K 4.3 08/01/2013 0520   CL 104 08/01/2013 0520   CO2 26 08/01/2013 0520   GLUCOSE 118* 08/01/2013 0520   BUN 38* 08/01/2013 0520   CREATININE 1.57* 08/01/2013 0520   CALCIUM 8.9 08/01/2013 0520   GFRNONAA 30* 08/01/2013 0520   GFRAA 35* 08/01/2013 0520   CBC    Component Value Date/Time   WBC 11.9* 07/29/2013 0632   RBC 3.08* 07/29/2013 0632   HGB 8.8* 07/29/2013 0632   HCT 28.0* 07/29/2013 0632   PLT 276 07/29/2013 0632   MCV 90.9 07/29/2013 0632   MCH 28.6 07/29/2013 0632   MCHC 31.4 07/29/2013 0632   RDW 14.6 07/29/2013 0632   LYMPHSABS 2.2 07/25/2013 0520   MONOABS 1.2* 07/25/2013 0520   EOSABS 0.8* 07/25/2013 0520   BASOSABS 0.1 07/25/2013 0520    Studies/Results: No  results found.  Medications: I have reviewed the patient's current medications.  Assessment/Plan:   1. Deconditioning CAD/aortic stenosis/status post AVR/CABG 07/17/2013  2. DVT Prophylaxis/Anticoagulation: Subcutaneous Lovenox. Monitor platelet counts and any signs of bleeding  3. Pain Management: Ultram 50 mg every 6 hours as needed. Monitor with increased activity  4. Mood/depression. Zoloft 50 mg daily. Provide emotional support  5. Neuropsych: This patient is capable of making decisions on her own behalf.  6. Acute blood loss anemia. hgb 8.5  7. Hypertension. Norvasc 5 mg daily, Lasix 20 mg daily, Avapro 75 mg daily and Lopressor 25 mg twice a day. Cont to monitor with increased activity. Seems to be getting better 8. History of asthma. Continue inhalers as directed. Check oxygen saturations every shift.  9. GERD. Protonix  10. Hyperlipidemia. Zocor  11. Diabetes mellitus with peripheral neuropathy. Hemoglobin A1c 6.6. Resume Glucophage as tolerated as prior to admission.  12. Left pleural effusion will monitor for increasing shortness of breath.  13. Renal insufficiency: labs are better Cont Rx    Length of stay, days: 10  Sonda Primes , MD 08/03/2013, 8:08 AM

## 2013-08-03 NOTE — Progress Notes (Signed)
Physical Therapy Note  Patient Details  Name: [name] MRN: [id number] Date of Birth: [birth year] Today's Date: 08/03/2013  1500-1540 (40 minutes) individual Pain: no reported pain Precautions: Orthostatic, Sternal Other: BP in supine 115/65  Oxygen sats 95% on 2 L [place].  Focus of treatment: Therapeutic exercise focused on activity tolerance; gait training/ endurance Treatment: transfers SPT SBA; Nustep Level 4 X 10 minutes LEs only; Gait - 75 feet RW close SBA (assist with oxygen tank).    Laporchia Nakajima,JIM 08/03/2013, 3:16 PM

## 2013-08-03 NOTE — Progress Notes (Signed)
Physical Therapy Note  Patient Details  Name: [name] MRN: [id number] Date of Birth: June 27, 1932 Today's Date: 08/03/2013  Time:0825-905  (40 min) Pain:  nione Individual session  Focus of treatment was bed mobility, transfers,  standing balance, endurance, activity tolerance.  Pt. Transferred from bed to wc with SBA and ambulated to sink with RW.  Performed sponge bath per pt's request.  Pt. Needed 1 verbal cue to lock wc when going from sit to stand.  O2 sats= 93%, HR= 81 (on 2 liters of oxygen).  Pt. Returned to wc beside bed to put on make up.  Call bell and phone in reach.         Humberto Seals 08/03/2013, 9:06 AM

## 2013-08-03 NOTE — Progress Notes (Signed)
At 2026 patient requested PRN trazodone 50mg  for sleep. Med effective.  [name]

## 2013-08-04 ENCOUNTER — Inpatient Hospital Stay (HOSPITAL_COMMUNITY): Payer: Medicare Other

## 2013-08-04 ENCOUNTER — Inpatient Hospital Stay (HOSPITAL_COMMUNITY): Payer: Medicare Other | Admitting: Occupational Therapy

## 2013-08-04 DIAGNOSIS — I080 Rheumatic disorders of both mitral and aortic valves: Secondary | ICD-10-CM

## 2013-08-04 DIAGNOSIS — J449 Chronic obstructive pulmonary disease, unspecified: Secondary | ICD-10-CM

## 2013-08-04 DIAGNOSIS — R5381 Other malaise: Secondary | ICD-10-CM

## 2013-08-04 DIAGNOSIS — J4489 Other specified chronic obstructive pulmonary disease: Secondary | ICD-10-CM

## 2013-08-04 LAB — GLUCOSE, CAPILLARY
Glucose-Capillary: 126 mg/dL — ABNORMAL HIGH (ref 70–99)
Glucose-Capillary: 116 mg/dL — ABNORMAL HIGH (ref 70–99)
Glucose-Capillary: 147 mg/dL — ABNORMAL HIGH (ref 70–99)

## 2013-08-04 NOTE — Progress Notes (Signed)
Physical Therapy Discharge Summary  Patient Details  Name: [name] MRN: [id number] Date of Birth: Dec 25, 1931  Today's Date: 08/04/2013  Session #1 Time: 1100-1154 Time Calculation (min): 54 min Individual therapy; Denies pain. Session focused on family education with pt's husband in regards to transfers, gait with RW, overall safety and guarding techniques, sternal precautions, management of O2 tubing with mobility, simulated car transfer, energy conservation techniques, stair negotiation and HEP for LE strength/balance. Also discussed floor transfer/what to do in case of fall (pt had fall with OT earlier this AM). Pt at overall close S level with cues needed for safe use of RW during mobility and A with management of O2 tubing. Husband successfully return demonstrated guarding/cueing for mobility and min A for stairs (as husband states there is not going to be rails).   Session #2: Time: 1415-1445 (30 min) Individual therapy; Denies pain. Session focused on gait with RW with emphasis on safety with AD/balance and administered and discussed results of Berg Balance Test (see below). Pt demonstrated 10 point improvement since score upon admission but still continues to demonstrate 100% risk for fall without AD. Pt verbalized understanding of results.    Patient has met 6 of 6 long term goals due to improved activity tolerance, improved balance and decreased pain.  Patient to discharge at an ambulatory level close S  with RW.   Patient's care partner is independent to provide the necessary physical and supervision assistance at discharge.  Reasons goals not met: n/a. All goals met at this time.  Recommendation:  Patient will benefit from ongoing skilled PT services in home health setting to continue to advance safe functional mobility, address ongoing impairments in gait, balance, functional strength, endurance, safety, adherence to precautions and minimize fall risk.  Equipment: No  equipment provided. Pt already has RW and has access to w/c if needed.  Reasons for discharge: treatment goals met and discharge from hospital  Patient/family agrees with progress made and goals achieved: Yes  PT Discharge Precautions/Restrictions Precautions Precautions: Sternal;Fall Precaution Comments: cues for precautions functionally Restrictions Weight Bearing Restrictions: No Vital Signs O2 Device: Nasal cannula O2 Flow Rate (L/min): 2 L/min Cognition Orientation Level: Oriented X4 Safety/Judgment: Impaired Comments: Pt with decreased memory to maintain precautions and safety recommendations for use of RW Sensation Sensation Light Touch: Appears Intact Coordination Gross Motor Movements are Fluid and Coordinated: Yes Motor  Motor Motor: Within Functional Limits  Locomotion  Ambulation Ambulation/Gait Assistance: 5: Supervision Assistive device: Rolling walker  Trunk/Postural Assessment  Cervical Assessment Cervical Assessment: Within Functional Limits Thoracic Assessment Thoracic Assessment: Within Functional Limits Lumbar Assessment Lumbar Assessment: Within Functional Limits Postural Control Postural Control:  (decreased balance strategies)  Balance Balance Balance Assessed: Yes Berg Balance Test Sit to Stand: Able to stand without using hands and stabilize independently Standing Unsupported: Able to stand 2 minutes with supervision Sitting with Back Unsupported but Feet Supported on Floor or Stool: Able to sit safely and securely 2 minutes Stand to Sit: Uses backs of legs against chair to control descent Transfers: Able to transfer with verbal cueing and /or supervision Standing Unsupported with Eyes Closed: Able to stand 10 seconds with supervision Standing Ubsupported with Feet Together: Able to place feet together independently and stand for 1 minute with supervision From Standing, Reach Forward with Outstretched Arm: Can reach forward >5 cm safely  (2") From Standing Position, Pick up Object from Floor: Able to pick up shoe, needs supervision From Standing Position, Turn to Look Behind Over each  Shoulder: Looks behind from both sides and weight shifts well Turn 360 Degrees: Needs close supervision or verbal cueing Standing Unsupported, Alternately Place Feet on Step/Stool: Able to complete >2 steps/needs minimal assist Standing Unsupported, One Foot in Front: Loses balance while stepping or standing Standing on One Leg: Unable to try or needs assist to prevent fall Total Score: 32 Static Sitting Balance Static Sitting - Level of Assistance: 6: Modified independent (Device/Increase time) Dynamic Sitting Balance Dynamic Sitting - Level of Assistance: 6: Modified independent (Device/Increase time) Static Standing Balance Static Standing - Level of Assistance: 5: Stand by assistance (with UE support) Dynamic Standing Balance Dynamic Standing - Level of Assistance: 5: Stand by assistance (with UE support) Extremity Assessment      RLE Assessment RLE Assessment:  (same as admission) LLE Assessment LLE Assessment:  (same as admission)  See FIM for current functional status  [name] [facility] 08/04/2013, 2:59 PM

## 2013-08-04 NOTE — Progress Notes (Signed)
Occupational Therapy Session Notes & Discharge Summary  Patient Details  Name: [name] MRN: [id number] Date of Birth: 1932/06/27  Today's Date: 08/04/2013  SESSION NOTES  Session #1 1000-1100 - 60 Minutes Individual Therapy No complaints of pain initially, patient with a headache at end of session; RN made aware Patient found seated edge of bed with husband present for family education. Patient with request to use bathroom, husband ambulated with patient into bathroom for toilet transfer and toileting. Therapist educated husband on best/safest way to assist patient. From here, patient gathered necessary items and ambulated -> ADL apartment. Patient sat on bed in order to doff clothes, then ambulated into bathroom for tub/shower transfer onto shower seat. Patient completed transfer and bathing at supervision level. As patient was going to stand to dry off she slipped and had an assisted fall -> bottom of tub; therapist assisted with fall. Patient slipped secondary to wet tub and wet seat & buttock. From here, therapist assisted patient back to seat and encouraged patient NOT to pull on grab bar with BUEs due to sternal precautions. Patient finished drying off and ambulated back to bed to get dressed. UB/LB dressing completed with supervision. Therapist recommended sticky pads at bottom of shower/tub floor to decrease risk of fall at home. Husband and patient agreed. After dressing, patient sat on couch with husband waiting on next therapy session. RN made aware and Safety Zone Portal completed for assisted fall.   Session #2 [id number] - 45 Minutes Co-Treatment with TR No complaints of pain Patient found seated edge of bed. Patient ambulated from room -> ADL apartment kitchen for simple meal prep/baking activity. Patient with increased fatigue during session, but willing to work with therapist. Patient sat for most of task, therapist recommended patient have a stool or seat in kitchen at  home if needed. Patient with slow processing abilities during task and took more than a reasonable amount of time to complete any task at hand. At end of session PT present for next therapy session.   -------------------------------------------------------------------------------------------------------------------------------------  DISCHARGE SUMMARY Patient has met 12 of 12 long term goals due to improved activity tolerance, improved balance, postural control, ability to compensate for deficits, improved attention, improved awareness and improved coordination.  Patient to discharge at overall Supervision level.  Patient's care partner is independent to provide the necessary supervision assistance at discharge.    Reasons goals not met: n/a, all goals met at this time.  Recommendation:  Patient will benefit from ongoing skilled OT services in home health setting to continue to advance functional skills in the area of BADL, iADL and Reduce care partner burden.  Equipment: No equipment provided, patient states she has a shower seat she can use. She refuses a BSC.   Reasons for discharge: treatment goals met and discharge from hospital  Patient/family agrees with progress made and goals achieved: Yes   Precautions/Restrictions  Precautions Precautions: Sternal;Fall Precaution Comments: cues for precautions functionally Restrictions Weight Bearing Restrictions: No  Vital Signs Therapy Vitals Temp: 97.5 F (36.4 C) Temp src: Oral Resp: 17 BP: 103/70 mmHg Patient Position, if appropriate: Sitting Oxygen Therapy SpO2: 95 % O2 Device: Nasal cannula  Pain Pain Assessment Pain Score: 1   ADL - See FIM  Vision/Perception  Vision - History Baseline Vision: Wears glasses all the time Patient Visual Report: No change from baseline Vision - Assessment Eye Alignment: Within Functional Limits Perception Perception: Within Functional Limits Praxis Praxis: Intact    Cognition Overall Cognitive Status: Within Functional  Limits for tasks assessed Arousal/Alertness: Awake/alert Orientation Level: Oriented X4 Memory: Appears intact Awareness: Appears intact Problem Solving: Appears intact Safety/Judgment: Impaired Comments: Pt with decreased memory to maintain precautions and safety recommendations for use of RW  Sensation Sensation Light Touch: Appears Intact Additional Comments: BUEs appear intact Coordination Gross Motor Movements are Fluid and Coordinated: Yes Fine Motor Movements are Fluid and Coordinated: Yes  Motor  Motor Motor: Within Functional Limits  Trunk/Postural Assessment  Cervical Assessment Cervical Assessment: Within Functional Limits Thoracic Assessment Thoracic Assessment: Within Functional Limits Lumbar Assessment Lumbar Assessment: Within Functional Limits Postural Control Postural Control:  (decreased balance strategies)   Balance Balance Balance Assessed: Yes Berg Balance Test Sit to Stand: Able to stand without using hands and stabilize independently Standing Unsupported: Able to stand 2 minutes with supervision Sitting with Back Unsupported but Feet Supported on Floor or Stool: Able to sit safely and securely 2 minutes Stand to Sit: Uses backs of legs against chair to control descent Transfers: Able to transfer with verbal cueing and /or supervision Standing Unsupported with Eyes Closed: Able to stand 10 seconds with supervision Standing Ubsupported with Feet Together: Able to place feet together independently and stand for 1 minute with supervision From Standing, Reach Forward with Outstretched Arm: Can reach forward >5 cm safely (2") From Standing Position, Pick up Object from Floor: Able to pick up shoe, needs supervision From Standing Position, Turn to Look Behind Over each Shoulder: Looks behind from both sides and weight shifts well Turn 360 Degrees: Needs close supervision or verbal cueing Standing  Unsupported, Alternately Place Feet on Step/Stool: Able to complete >2 steps/needs minimal assist Standing Unsupported, One Foot in Front: Loses balance while stepping or standing Standing on One Leg: Unable to try or needs assist to prevent fall Total Score: 32 Static Sitting Balance Static Sitting - Level of Assistance: 6: Modified independent (Device/Increase time) Dynamic Sitting Balance Dynamic Sitting - Level of Assistance: 6: Modified independent (Device/Increase time) Static Standing Balance Static Standing - Level of Assistance: 5: Stand by assistance (with UE support) Dynamic Standing Balance Dynamic Standing - Level of Assistance: 5: Stand by assistance (with UE support)  Extremity/Trunk Assessment RUE Assessment RUE Assessment: Within Functional Limits LUE Assessment LUE Assessment: Within Functional Limits  See FIM for current functional status  Yolandra Habig 08/04/2013, 3:05 PM

## 2013-08-04 NOTE — Discharge Summary (Signed)
Discharge summary job [id number]

## 2013-08-04 NOTE — Progress Notes (Signed)
Subjective/Complaints: Has her clothes on ready for therapy. Took oxygen off because it's irritating her nose. A 12 point review of systems has been performed and if not noted above is otherwise negative.   Objective: Vital Signs: Blood pressure 112/71, pulse 67, temperature 97.6 F (36.4 C), temperature source Oral, resp. rate 18, height 5' (1.524 m), weight 54.25 kg (119 lb 9.6 oz), SpO2 95.00%. No results found. No results found for this basename: WBC, HGB, HCT, PLT,  in the last 72 hours No results found for this basename: NA, K, CL, CO, GLUCOSE, BUN, CREATININE, CALCIUM,  in the last 72 hours CBG (last 3)   Recent Labs  08/03/13 1628 08/03/13 2041 08/04/13 0719  GLUCAP 135* 161* 136*    Wt Readings from Last 3 Encounters:  08/04/13 54.25 kg (119 lb 9.6 oz)  07/24/13 57.607 kg (127 lb)  07/24/13 57.607 kg (127 lb)    Physical Exam:  Constitutional: Patient was able to provide her name, place and date of birth. She had some difficulty recalling her full hospital course .She followed simple commands  Frail appearing.  HENT:  Head: Normocephalic.  Eyes: EOM are normal.  Neck: Normal range of motion. Neck supple. No thyromegaly present.  Cardiovascular:  Cardiac rate controlled with murmur Respiratory:  Decreased breath sounds at the bases but air movement as a whole is improved. No wheezing. Comfortable at rest GI: Soft. Bowel sounds are normal. She exhibits no distension.  Neurological: She is alert.  No CN deficits. Strength 4 prox to 4/5 distally . No sensory findings. dtr's 2+ at bilateral patellar and Achilles.  Skin:  Midline chest incision clean and dry, healing nicely with scarring.  motor strength is 5/5 in the right upper, 4/5 in the left biceps otherwise 5/5  4/5 bilateral hip flexors knee extensors ankle dorsiflexors and plantar flexors  Sensation intact to light touch in bilateral upper and lower limbs   Assessment/Plan: 1. Functional deficits secondary  to deconditioning due to AS and multiple medical,  AVR/CABG which require 3+ hours per day of interdisciplinary therapy in a comprehensive inpatient rehab setting. Physiatrist is providing close team supervision and 24 hour management of active medical problems listed below. Physiatrist and rehab team continue to assess barriers to discharge/monitor patient progress toward functional and medical goals. FIM: FIM - Bathing Bathing Steps Patient Completed: Chest;Right Arm;Left Arm;Abdomen;Front perineal area;Buttocks;Right upper leg;Left upper leg;Right lower leg (including foot);Left lower leg (including foot) Bathing: 5: Supervision: Safety issues/verbal cues  FIM - Upper Body Dressing/Undressing Upper body dressing/undressing steps patient completed: Thread/unthread right bra strap;Thread/unthread left bra strap;Thread/unthread right sleeve of pullover shirt/dresss;Thread/unthread left sleeve of pullover shirt/dress;Put head through opening of pull over shirt/dress;Pull shirt over trunk Upper body dressing/undressing: 5: Supervision: Safety issues/verbal cues FIM - Lower Body Dressing/Undressing Lower body dressing/undressing steps patient completed: Thread/unthread right underwear leg;Thread/unthread left underwear leg;Thread/unthread left pants leg;Don/Doff right sock;Don/Doff left sock;Don/Doff right shoe;Don/Doff left shoe;Pull underwear up/down;Pull pants up/down;Thread/unthread right pants leg Lower body dressing/undressing: 5: Supervision: Safety issues/verbal cues  FIM - Toileting Toileting steps completed by patient: Adjust clothing prior to toileting;Performs perineal hygiene;Adjust clothing after toileting Toileting Assistive Devices: Grab bar or rail for support Toileting: 5: Supervision: Safety issues/verbal cues  FIM - Diplomatic Services operational officer Devices: Grab bars;Walker Toilet Transfers: 5-To toilet/BSC: Supervision (verbal cues/safety issues);5-From toilet/BSC:  Supervision (verbal cues/safety issues)  FIM - Banker Devices: Manufacturing systems engineer Transfer: 6: Supine > Sit: No assist;6: Sit > Supine: No assist;5: Bed >  Chair or W/C: Supervision (verbal cues/safety issues);5: Chair or W/C > Bed: Supervision (verbal cues/safety issues)  FIM - Locomotion: Wheelchair Locomotion: Wheelchair: 1: Total Assistance/staff pushes wheelchair (Pt<25%) FIM - Locomotion: Ambulation Locomotion: Ambulation Assistive Devices: Designer, industrial/product Ambulation/Gait Assistance: 4: Min guard Locomotion: Ambulation: 2: Travels 50 - 149 ft with minimal assistance (Pt.>75%)  Comprehension Comprehension Mode: Auditory Comprehension: 5-Understands basic 90% of the time/requires cueing < 10% of the time  Expression Expression Mode: Verbal Expression: 5-Expresses basic needs/ideas: With extra time/assistive device  Social Interaction Social Interaction: 5-Interacts appropriately 90% of the time - Needs monitoring or encouragement for participation or interaction.  Problem Solving Problem Solving: 5-Solves basic 90% of the time/requires cueing < 10% of the time  Memory Memory: 5-Recognizes or recalls 90% of the time/requires cueing < 10% of the time  Medical Problem List and Plan:  1. Deconditioning CAD/aortic stenosis/status post AVR/CABG 07/17/2013  2. DVT Prophylaxis/Anticoagulation: Subcutaneous Lovenox. No adverse reactions  3. Pain Management: Ultram 50 mg every 6 hours as needed. Pain controlled 4. Mood/depression. Zoloft 50 mg daily. Provide emotional support  5. Neuropsych: This patient is capable of making decisions on her own behalf.  6. Acute blood loss anemia. hgb 8.5 7. Hypertension. Norvasc 5 mg daily, Lasix 20 mg daily, Avapro 75 mg daily and Lopressor 25 mg twice a day. Monitor with increased activity  8. History of asthma. Continue inhalers as directed.   Wean oxygen as able--humidify oxygen. Sat 90% onRA this am 9.  GERD. Protonix  10. Hyperlipidemia. Zocor  11. Diabetes mellitus with peripheral neuropathy.  Hemoglobin A1c 6.6. Continue to hold Glucophage   12. Left pleural effusion: cxr stable to improved. Appears to be moving air better 13. Renal insufficiency:  Lasix held and resumed---increase to bid. Weights generally stable., I's and O's up a little. Still has some orthopnea. Pt notes some fluid on feet at end of day.   -bmet--stable   -lasix increased to 20mg  bid   LOS (Days) 11 A FACE TO FACE EVALUATION WAS PERFORMED  [name] 08/04/2013 7:35 AM

## 2013-08-04 NOTE — Progress Notes (Signed)
Recreational Therapy Session Note  Patient Details  Name: [name] MRN: [id number] Date of Birth: 1931/10/02 Today's Date: 08/04/2013 Time:  1330-1415 Pain: no c/o Skilled Therapeutic Interventions/Progress Updates: [name] focused on activity tolerance, functional ambulation using RW, standing tolerance, energy conservation & safety during simple kitchen activity.  Pt required supervision- min cues & set up assist.  Pt with c/o fatigue needing to sit on elevated stool for most of the task   Pt required increased time & min questioning cues for simple kitchen task.  Therapy/Group: Co-Treatment   [name] 08/04/2013, 3:37 PM

## 2013-08-05 DIAGNOSIS — I080 Rheumatic disorders of both mitral and aortic valves: Secondary | ICD-10-CM

## 2013-08-05 DIAGNOSIS — R5381 Other malaise: Secondary | ICD-10-CM

## 2013-08-05 DIAGNOSIS — J449 Chronic obstructive pulmonary disease, unspecified: Secondary | ICD-10-CM

## 2013-08-05 LAB — GLUCOSE, CAPILLARY: Glucose-Capillary: 143 mg/dL — ABNORMAL HIGH (ref 70–99)

## 2013-08-05 MED ORDER — TRAMADOL HCL 50 MG PO TABS: 50.0000 mg | ORAL_TABLET | Freq: Four times a day (QID) | ORAL | Status: DC | PRN

## 2013-08-05 MED ORDER — SERTRALINE HCL 50 MG PO TABS: 50.0000 mg | ORAL_TABLET | Freq: Every morning | ORAL | Status: DC

## 2013-08-05 MED ORDER — IPRATROPIUM-ALBUTEROL 20-100 MCG/ACT IN AERS: 2.0000 | INHALATION_SPRAY | Freq: Two times a day (BID) | RESPIRATORY_TRACT | Status: DC

## 2013-08-05 MED ORDER — IRBESARTAN 75 MG PO TABS: 75.0000 mg | ORAL_TABLET | Freq: Every day | ORAL | Status: DC

## 2013-08-05 MED ORDER — METOPROLOL TARTRATE 25 MG PO TABS: 25.0000 mg | ORAL_TABLET | Freq: Two times a day (BID) | ORAL | Status: DC

## 2013-08-05 MED ORDER — FUROSEMIDE 20 MG PO TABS: 20.0000 mg | ORAL_TABLET | Freq: Every day | ORAL | Status: DC

## 2013-08-05 MED ORDER — ASPIRIN 325 MG PO TBEC: 325.0000 mg | DELAYED_RELEASE_TABLET | Freq: Every day | ORAL | Status: DC

## 2013-08-05 MED ORDER — FLUTICASONE PROPIONATE HFA 44 MCG/ACT IN AERO: 2.0000 | INHALATION_SPRAY | Freq: Every day | RESPIRATORY_TRACT | Status: DC

## 2013-08-05 MED ORDER — POTASSIUM CHLORIDE ER 10 MEQ PO TBCR: 10.0000 meq | EXTENDED_RELEASE_TABLET | Freq: Every day | ORAL | Status: DC

## 2013-08-05 MED ORDER — AMLODIPINE BESYLATE 5 MG PO TABS: 5.0000 mg | ORAL_TABLET | Freq: Every day | ORAL | Status: DC

## 2013-08-05 NOTE — Progress Notes (Signed)
Pt. Got d/c instructions and follow up appointments.Oxigen tank was delivered to pt.'s room.Pt. Ready to go home with husband.

## 2013-08-05 NOTE — Progress Notes (Signed)
SATURATION QUALIFICATIONS: (This note is used to comply with regulatory documentation for home oxygen)  Patient Saturations on Room Air at Rest = 81 %  Patient Saturations on Room Air while Ambulating = 77%  Patient Saturations on 2 Liters of oxygen while Ambulating = 95%  Please briefly explain why patient needs home oxygen:   Recent CABG with AVR with chronic h/o asthma.  Severe physical deconditioning.  Melford Tullier, LCSW

## 2013-08-05 NOTE — Progress Notes (Signed)
Social Work  Discharge Note  The overall goal for the admission was met for:   Discharge location: Yes - home with husband who will provide 24/7 supervision/ assistance  Length of Stay: Yes - 8 days  Discharge activity level: Yes - supervision  Home/community participation: Yes  Services provided included: MD, RD, PT, OT, RN, TR, Pharmacy and SW  Financial Services: Medicare (**Blue Medicare)  Follow-up services arranged: Home Health: RN, PT via [facility] , DME: home O2 via [facility] and Patient/Family has no preference for HH/DME agencies  Comments (or additional information):  Patient/Family verbalized understanding of follow-up arrangements: Yes  Individual responsible for coordination of the follow-up plan: patient  Confirmed correct DME delivered: [name] 08/05/2013    [name]

## 2013-08-05 NOTE — Discharge Summary (Signed)
[name], [name]                ACCOUNT NO.:  [id number]  MEDICAL RECORD NO.:  [id number]  LOCATION:  4M01C                        FACILITY:  [facility]  PHYSICIAN:  Ranelle Oyster, M.D.DATE OF BIRTH:  29-Dec-1931  DATE OF ADMISSION:  07/24/2013 DATE OF DISCHARGE:  08/05/2013                              DISCHARGE SUMMARY   DISCHARGE DIAGNOSES: 1. Deconditioning - coronary artery disease - aortic valve replacement     with coronary artery bypass graft 2. Subcutaneous Lovenox for deep vein thrombosis prophylaxis. 3. Pain management. 4. Depression. 5. Acute blood loss anemia. 6. Hypertension. 7. History of asthma. 8. Gastroesophageal reflux disease. 9. Hyperlipidemia. 10.Diabetes mellitus. 11.Peripheral neuropathy. 12.Left pleural effusion, improved. 13.Renal insufficiency.  HISTORY OF PRESENT ILLNESS:  This is a 79 year old right-handed female, admitted on July 17, 2013, with progressive shortness of breath, chest pain, and a known history of aortic stenosis.  Cardiac catheterization showed 70% stenosis in the mid LAD and LV systolic function fairly well preserved.  Underwent aortic valve replacement. CABG x1 on July 17, 2013, per Dr. Kathlee Nations Trigt, and placed on aspirin therapy.  Subcutaneous Lovenox for DVT prophylaxis. Postoperative confusion, decreased awareness.  Cranial CT scan showed no acute abnormalities.  Acute blood loss anemia, 8.5 to 9.1, and monitored.  Sternal precautions implemented after CABG.  Physical and occupational therapy ongoing.  The patient was admitted for comprehensive rehab program.  PAST MEDICAL HISTORY:  See discharge diagnoses.  SOCIAL HISTORY:  Lives with spouse.  FUNCTIONAL HISTORY:  Prior to admission, independent.  FUNCTIONAL STATUS:  Upon admission to rehab service, was moderate assist 77 feet with assistive device.  PHYSICAL EXAMINATION:  VITAL SIGNS:  Blood pressure 172/69, pulse 82, temperature 98.3, respirations  20. GENERAL:  This was an alert female, in no acute distress.  Followed simple commands.  She was able to name person, place, date of birth. She had some difficulty recalling full events of her hospital course. LUNGS:  Clear to auscultation. CARDIAC:  Regular rate and rhythm. ABDOMEN:  Soft, nontender.  Good bowel sounds.  Chest incision well healed.  REHABILITATION HOSPITAL COURSE:  The patient was admitted to inpatient rehab services with therapies initiated on a 3-hour daily basis, consisting of physical therapy, occupational therapy, and rehabilitation nursing.  The following issues were addressed during the patient's rehabilitation stay.  Pertaining to [name]'s deconditioning after AVR, CABG, surgical site healing nicely with sternal precautions, she would follow up with Cardiothoracic Surgery.  Subcutaneous Lovenox ongoing for DVT prophylaxis throughout her rehab course with no bleeding episodes.  Pain management with the use of Ultram and good results. Acute blood loss anemia remained stable.  No bleeding episodes noted. Latest hemoglobin of 8.8.  Blood pressures remained well controlled on Avapro as well as Norvasc with Lasix 20 mg twice daily and low-dose beta- blocker, Lopressor.  She had no chest pain or shortness of breath.  She did have a history of diabetes mellitus, hemoglobin A1c 6.6.  Her blood sugars were within acceptable range.  She had been on Glucophage prior to admission.  Noted in the hospital course left pleural effusion. Latest chest x-ray improved.  Oxygen saturations remained greater than 90% on room  air.  The patient received weekly collaborative interdisciplinary team conferences to discuss estimated length of stay, family teaching, and any barriers to discharge.  She was continent of bowel and bladder.  Overall supervision, occasional steady assist for activities of daily living.  Supervision minimal assist overall for transfers, ambulation with  very poor activity tolerance, however, did show steady improvement.  Full family teaching was completed with her family and she was discharged home with ongoing therapies dictated per [employer].  DISCHARGE MEDICATIONS:  At the time of dictation included: 1. Norvasc 5 mg p.o. daily. 2. Aspirin 325 mg p.o. daily. 3. Flovent inhaler 2 puffs daily. 4. Lasix 20 mg p.o. b.i.d. 5. Albuterol Combivent inhaler 2 puffs twice daily. 6. Avapro 75 mg p.o. daily. 7. Xalatan ophthalmic solution 0.05% 1 drop both eyes at bedtime. 8. Lopressor 25 mg p.o. b.i.d. 9. Protonix 40 mg p.o. daily. 10.Potassium chloride 10 mEq p.o. daily. 11.Zoloft 50 mg p.o. daily. 12.Zocor 10 mg p.o. daily. 13.Ultram 50 mg p.o. every 6 hours as needed moderate pain.  DIET:  Mechanical soft with diabetic restrictions.  SPECIAL INSTRUCTIONS:  Sternal precautions.Oxygen 2 liter as directed.  Follow up with Dr. Harold Hedge at the outpatient rehab service office as needed; Dr. Kathlee Nations Trigt, Cardiothoracic Surgery, in 2 weeks, call for appointment; Dr. Laurann Montana, medical management.     [name], P.A.   ______________________________ Ranelle Oyster, M.D.    DA/MEDQ  D:  08/04/2013  T:  08/05/2013  Job:  [id number]  cc:   Kerin Perna, M.D. [place]acie [place] Cliffton Asters, M.D.

## 2013-08-05 NOTE — Progress Notes (Signed)
Recreational Therapy Discharge Summary Patient Details  Name: [name] MRN: [id number] Date of Birth: August 03, 1932 Today's Date: 08/05/2013  Long term goals set: 1  Long term goals met: 1  Comments on progress toward goals: Pt has made good progress toward goal and is ready for discharge home today at supervision ambulatory level using RW.  Pt continues to require verbal cues for safety during all mobility/balance tasks.  Elizbeth Posa

## 2013-08-05 NOTE — Progress Notes (Addendum)
Subjective/Complaints: Denies issues. Anxious to go home!!in her sweatsuit sitting eob! A 12 point review of systems has been performed and if not noted above is otherwise negative.   Objective: Vital Signs: Blood pressure 142/85, pulse 71, temperature 98.2 F (36.8 C), temperature source Oral, resp. rate 18, height 5' (1.524 m), weight 54.522 kg (120 lb 3.2 oz), SpO2 95.00%. No results found. No results found for this basename: WBC, HGB, HCT, PLT,  in the last 72 hours No results found for this basename: NA, K, CL, CO, GLUCOSE, BUN, CREATININE, CALCIUM,  in the last 72 hours CBG (last 3)   Recent Labs  08/04/13 1149 08/04/13 1626 08/04/13 2059  GLUCAP 126* 116* 147*    Wt Readings from Last 3 Encounters:  08/05/13 54.522 kg (120 lb 3.2 oz)  07/24/13 57.607 kg (127 lb)  07/24/13 57.607 kg (127 lb)    Physical Exam:  Constitutional: Patient was able to provide her name, place and date of birth. She had some difficulty recalling her full hospital course .She followed simple commands  Frail appearing.  HENT:  Head: Normocephalic.  Eyes: EOM are normal.  Neck: Normal range of motion. Neck supple. No thyromegaly present.  Cardiovascular:  Cardiac rate controlled with murmur Respiratory:  Decreased breath sounds at the bases but air movement as a whole is improved. No wheezing. Comfortable at rest GI: Soft. Bowel sounds are normal. She exhibits no distension.  Neurological: She is alert.  No CN deficits. Strength 4 prox to 4/5 distally . No sensory findings. dtr's 2+ at bilateral patellar and Achilles.  Skin:  Midline chest incision clean and dry, healing nicely with scarring.  motor strength is 5/5 in the right upper, 4/5 in the left biceps otherwise 5/5  4/5 bilateral hip flexors knee extensors ankle dorsiflexors and plantar flexors  Sensation intact to light touch in bilateral upper and lower limbs   Assessment/Plan: 1. Functional deficits secondary to deconditioning  due to AS and multiple medical,  AVR/CABG which require 3+ hours per day of interdisciplinary therapy in a comprehensive inpatient rehab setting. Physiatrist is providing close team supervision and 24 hour management of active medical problems listed below. Physiatrist and rehab team continue to assess barriers to discharge/monitor patient progress toward functional and medical goals.  Goals met. Home today.   FIM: FIM - Bathing Bathing Steps Patient Completed: Chest;Right Arm;Left Arm;Abdomen;Front perineal area;Buttocks;Right upper leg;Left upper leg;Right lower leg (including foot);Left lower leg (including foot) Bathing: 5: Supervision: Safety issues/verbal cues  FIM - Upper Body Dressing/Undressing Upper body dressing/undressing steps patient completed: Thread/unthread right bra strap;Thread/unthread left bra strap;Thread/unthread right sleeve of pullover shirt/dresss;Thread/unthread left sleeve of pullover shirt/dress;Put head through opening of pull over shirt/dress;Pull shirt over trunk (sports bra increases independence) Upper body dressing/undressing: 5: Supervision: Safety issues/verbal cues FIM - Lower Body Dressing/Undressing Lower body dressing/undressing steps patient completed: Thread/unthread right underwear leg;Thread/unthread left underwear leg;Thread/unthread left pants leg;Don/Doff right sock;Don/Doff left sock;Don/Doff right shoe;Don/Doff left shoe;Pull underwear up/down;Pull pants up/down;Thread/unthread right pants leg Lower body dressing/undressing: 5: Supervision: Safety issues/verbal cues  FIM - Toileting Toileting steps completed by patient: Adjust clothing prior to toileting;Performs perineal hygiene;Adjust clothing after toileting Toileting Assistive Devices: Grab bar or rail for support Toileting: 5: Supervision: Safety issues/verbal cues  FIM - Diplomatic Services operational officer Devices: Grab bars Toilet Transfers: 5-To toilet/BSC: Supervision  (verbal cues/safety issues);5-From toilet/BSC: Supervision (verbal cues/safety issues)  FIM - Banker Devices: Manufacturing systems engineer Transfer: 6: Supine > Sit: No assist;6: Sit >  Supine: No assist;5: Bed > Chair or W/C: Supervision (verbal cues/safety issues);5: Chair or W/C > Bed: Supervision (verbal cues/safety issues)  FIM - Locomotion: Wheelchair Locomotion: Wheelchair: 0: Activity did not occur FIM - Locomotion: Ambulation Locomotion: Ambulation Assistive Devices: Designer, industrial/product Ambulation/Gait Assistance: 5: Supervision Locomotion: Ambulation: 2: Travels 50 - 149 ft with supervision/safety issues  Comprehension Comprehension Mode: Auditory Comprehension: 5-Understands basic 90% of the time/requires cueing < 10% of the time  Expression Expression Mode: Verbal Expression: 5-Expresses basic needs/ideas: With no assist  Social Interaction Social Interaction: 5-Interacts appropriately 90% of the time - Needs monitoring or encouragement for participation or interaction.  Problem Solving Problem Solving: 5-Solves basic 90% of the time/requires cueing < 10% of the time  Memory Memory: 5-Recognizes or recalls 90% of the time/requires cueing < 10% of the time  Medical Problem List and Plan:  1. Deconditioning CAD/aortic stenosis/status post AVR/CABG 07/17/2013  2. DVT Prophylaxis/Anticoagulation: Subcutaneous Lovenox. No adverse reactions  3. Pain Management: Ultram 50 mg every 6 hours as needed. Pain controlled 4. Mood/depression. Zoloft 50 mg daily. Provide emotional support  5. Neuropsych: This patient is capable of making decisions on her own behalf.  6. Acute blood loss anemia. hgb 8.5 7. Hypertension. Norvasc 5 mg daily, Lasix 20 mg daily, Avapro 75 mg daily and Lopressor 25 mg twice a day. Monitor with increased activity  8. History of asthma. Continue inhalers as directed.   Wean oxygen as able--humidify oxygen. Sat 90% onRA this am 9.  GERD. Protonix  10. Hyperlipidemia. Zocor  11. Diabetes mellitus with peripheral neuropathy.  Hemoglobin A1c 6.6. Controlled at present 12. Left pleural effusion: cxr stable to improved. Appears to be moving air better  -would like for her to continue oxygen scheduled at night and PRN during computer 13. Renal insufficiency:  Lasix held and resumed---increase to bid. Weights generally stable., I's and O's up a little. Still has some orthopnea. Pt notes some fluid on feet at end of day.   -bmet--stable   -lasix increased to 20mg  bid   LOS (Days) 12 A FACE TO FACE EVALUATION WAS PERFORMED  SWARTZ,ZACHARY T 08/05/2013 7:17 AM

## 2013-08-11 ENCOUNTER — Telehealth: Payer: Self-pay

## 2013-08-11 ENCOUNTER — Other Ambulatory Visit: Payer: Self-pay | Admitting: *Deleted

## 2013-08-11 DIAGNOSIS — I2510 Atherosclerotic heart disease of native coronary artery without angina pectoris: Secondary | ICD-10-CM

## 2013-08-11 DIAGNOSIS — I350 Nonrheumatic aortic (valve) stenosis: Secondary | ICD-10-CM

## 2013-08-11 NOTE — Telephone Encounter (Signed)
FYI::: Stanton Kidney from [facility] called to inform you that that patient declined home care admissions. Patient said she didn't need any help.

## 2013-08-13 ENCOUNTER — Ambulatory Visit (INDEPENDENT_AMBULATORY_CARE_PROVIDER_SITE_OTHER): Payer: Self-pay | Admitting: Cardiothoracic Surgery

## 2013-08-13 ENCOUNTER — Encounter: Payer: Self-pay | Admitting: Cardiothoracic Surgery

## 2013-08-13 ENCOUNTER — Ambulatory Visit
Admission: RE | Admit: 2013-08-13 | Discharge: 2013-08-13 | Disposition: A | Discharge status: Home / Self Care | Payer: Medicare Other | Source: Ambulatory Visit | Attending: Cardiothoracic Surgery | Admitting: Cardiothoracic Surgery

## 2013-08-13 VITALS — BP 118/70 | HR 78 | Resp 16 | Ht 60.0 in | Wt 120.0 lb

## 2013-08-13 DIAGNOSIS — I359 Nonrheumatic aortic valve disorder, unspecified: Secondary | ICD-10-CM

## 2013-08-13 DIAGNOSIS — I2510 Atherosclerotic heart disease of native coronary artery without angina pectoris: Secondary | ICD-10-CM

## 2013-08-13 DIAGNOSIS — Z954 Presence of other heart-valve replacement: Secondary | ICD-10-CM

## 2013-08-13 DIAGNOSIS — I350 Nonrheumatic aortic (valve) stenosis: Secondary | ICD-10-CM

## 2013-08-13 DIAGNOSIS — Z951 Presence of aortocoronary bypass graft: Secondary | ICD-10-CM

## 2013-08-13 DIAGNOSIS — Z952 Presence of prosthetic heart valve: Secondary | ICD-10-CM

## 2013-08-13 NOTE — Progress Notes (Signed)
PCP is [name], MD Referring Provider is Nahser, Deloris Ping, MD  Chief Complaint  Patient presents with  . Routine Post Op    3 wk f/u s/p CABG X 1, AVR with a cxr    HPI: Patient returns for her first postop visit after bioprosthetic aVR for severe aortic stenosis and CABG x1. Patient is now at home after being in inpatient rehabilitation for 2 weeks. She is much stronger now and denies angina, CHF, or problems with the surgical incision. Her appetite sleeping are improved. Past Medical History  Diagnosis Date  . Hypertension   . Asthma     uses inhaler as needed  . Diabetes mellitus   . GERD (gastroesophageal reflux disease)   . Arthritis   . Anxiety   . Depression   . Hyperlipidemia   . Emphysema of lung   . Glaucoma   . [name]   . Aortic stenosis     Past Surgical History  Procedure Laterality Date  . Ankle surgery  1998    d/t fx.  Left ankle  . Facial cosmetic surgery      face lift  . Nose surgery    . Laparoscopic assisted vaginal hysterectomy  06/21/2011    Procedure: LAPAROSCOPIC ASSISTED VAGINAL HYSTERECTOMY;  Surgeon: Freddy Finner;  Location: [facility];  Service: Gynecology;  Laterality: N/A;  . Salpingoophorectomy  06/21/2011    Procedure: SALPINGO OOPHERECTOMY;  Surgeon: Freddy Finner;  Location: [facility];  Service: Gynecology;  Laterality: Bilateral;  . Anterior and posterior repair  06/21/2011    Procedure: ANTERIOR (CYSTOCELE) AND POSTERIOR REPAIR (RECTOCELE);  Surgeon: Freddy Finner;  Location: [facility];  Service: Gynecology;  Laterality: N/A;  . Vaginal prolapse repair  06/21/2011    Procedure: SACROPEXY;  Surgeon: Freddy Finner;  Location: [facility];  Service: Gynecology;  Laterality: N/A;  sacrospinous ligament suspension  . Cardiac catheterization    . Aortic valve replacement N/A 07/17/2013    Procedure: AORTIC VALVE REPLACEMENT (AVR);  Surgeon: Kerin Perna, MD;  Location: [facility] OR;  Service: Open Heart Surgery;  Laterality: N/A;   . Coronary artery bypass graft N/A 07/17/2013    Procedure: CORONARY ARTERY BYPASS GRAFT, TIMES ONE, ON PUMP, USING RIGHT INTERNAL MAMMARY ARTERY.;  Surgeon: Kerin Perna, MD;  Location: [facility] OR;  Service: Open Heart Surgery;  Laterality: N/A;  . Intraoperative transesophageal echocardiogram N/A 07/17/2013    Procedure: INTRAOPERATIVE TRANSESOPHAGEAL ECHOCARDIOGRAM;  Surgeon: Kerin Perna, MD;  Location: [facility] OR;  Service: Open Heart Surgery;  Laterality: N/A;  . Abdominal hysterectomy    . Cardiac valve replacement    . Coronary angioplasty      Family History  Problem Relation Age of Onset  . Emphysema Father   . Colon cancer Neg Hx   . Esophageal cancer Neg Hx   . Rectal cancer Neg Hx   . Stomach cancer Neg Hx     Social History History  Substance Use Topics  . Smoking status: Former Smoker -- 2.00 packs/day for 15 years    Types: Cigarettes    Quit date: 09/18/1972  . Smokeless tobacco: Never Used  . Alcohol Use: No    Current Outpatient Prescriptions  Medication Sig Dispense Refill  . acetaminophen (TYLENOL) 325 MG tablet Take 650 mg by mouth as needed for pain.       [name] amLODipine (NORVASC) 5 MG tablet Take 1 tablet (5 mg total) by mouth daily.  30 tablet  1  . aspirin 325 MG EC tablet Take 1 tablet (325 mg total) by mouth daily.  30 tablet  0  . bimatoprost (LUMIGAN) 0.01 % SOLN Place 1 drop into both eyes at bedtime.       . Cholecalciferol (VITAMIN D-3 PO) Take 2,000 Units by mouth at bedtime.       . fluticasone (FLOVENT HFA) 44 MCG/ACT inhaler Inhale 2 puffs into the lungs daily.  1 Inhaler  12  . furosemide (LASIX) 20 MG tablet Take 1 tablet (20 mg total) by mouth daily.  60 tablet  1  . Ipratropium-Albuterol (COMBIVENT) 20-100 MCG/ACT AERS respimat Inhale 2 puffs into the lungs 2 (two) times daily.  1 Inhaler  1  . irbesartan (AVAPRO) 75 MG tablet Take 1 tablet (75 mg total) by mouth daily.  30 tablet  1  . metoprolol tartrate (LOPRESSOR) 25 MG tablet Take 1  tablet (25 mg total) by mouth 2 (two) times daily.  60 tablet  1  . Multiple Vitamins-Minerals (OCUVITE-LUTEIN PO) Take 1 tablet by mouth every morning.       [name] omeprazole (PRILOSEC) 20 MG capsule Take 20 mg by mouth at bedtime.       . potassium chloride (K-DUR) 10 MEQ tablet Take 1 tablet (10 mEq total) by mouth daily.  30 tablet  1  . pravastatin (PRAVACHOL) 40 MG tablet Take 40 mg by mouth daily.       . sertraline (ZOLOFT) 50 MG tablet Take 1 tablet (50 mg total) by mouth every morning.  30 tablet  1  . traMADol (ULTRAM) 50 MG tablet Take 1 tablet (50 mg total) by mouth every 6 (six) hours as needed.  90 tablet  0   No current facility-administered medications for this visit.    No Known Allergies  Review of Systems no fever, incisions healing well, minimal postsurgical pain  BP 118/70  Pulse 78  Resp 16  Ht 5' (1.524 m)  Wt 120 lb (54.432 kg)  BMI 23.44 kg/m2 Physical Exam Alert and comfortable Lungs distant but clear Sternum stable well-healed No pedal edema Heart rhythm regular, no murmur  Diagnostic Tests:  Chest x-ray with clear lung fields, minimal elevation of left hemidiaphragm Impression: Excellent early course following aVR CABG x1. She knows she can resume light activities around the house in driving. She will refer to outpatient cardiac rehabilitation at the hospital.  Plan: Return for review in 3-4 weeks.

## 2013-09-01 ENCOUNTER — Encounter: Payer: Self-pay | Admitting: Cardiology

## 2013-09-01 ENCOUNTER — Other Ambulatory Visit: Payer: Self-pay | Admitting: *Deleted

## 2013-09-01 ENCOUNTER — Ambulatory Visit (INDEPENDENT_AMBULATORY_CARE_PROVIDER_SITE_OTHER): Payer: Medicare Other | Admitting: Cardiology

## 2013-09-01 VITALS — BP 138/68 | HR 83 | Ht 60.0 in | Wt 116.0 lb

## 2013-09-01 DIAGNOSIS — R0600 Dyspnea, unspecified: Secondary | ICD-10-CM

## 2013-09-01 DIAGNOSIS — I10 Essential (primary) hypertension: Secondary | ICD-10-CM

## 2013-09-01 DIAGNOSIS — R0989 Other specified symptoms and signs involving the circulatory and respiratory systems: Secondary | ICD-10-CM

## 2013-09-01 DIAGNOSIS — R0609 Other forms of dyspnea: Secondary | ICD-10-CM

## 2013-09-01 DIAGNOSIS — R0602 Shortness of breath: Secondary | ICD-10-CM

## 2013-09-01 DIAGNOSIS — E785 Hyperlipidemia, unspecified: Secondary | ICD-10-CM

## 2013-09-01 DIAGNOSIS — R7989 Other specified abnormal findings of blood chemistry: Secondary | ICD-10-CM

## 2013-09-01 LAB — BASIC METABOLIC PANEL
BUN: 51 mg/dL — ABNORMAL HIGH (ref 6–23)
CO2: 20 mEq/L (ref 19–32)
Calcium: 9.3 mg/dL (ref 8.4–10.5)
Chloride: 108 mEq/L (ref 96–112)
Creatinine, Ser: 1.8 mg/dL — ABNORMAL HIGH (ref 0.4–1.2)
GFR: 29.04 mL/min — ABNORMAL LOW (ref >60.00)
Potassium: 4.3 mEq/L (ref 3.5–5.1)
Sodium: 139 mEq/L (ref 135–145)

## 2013-09-01 LAB — D-DIMER, QUANTITATIVE: D-Dimer, Quant: 3.36 ug/mL-FEU — ABNORMAL HIGH (ref 0.00–0.48)

## 2013-09-01 NOTE — Assessment & Plan Note (Signed)
Continue statin. 

## 2013-09-01 NOTE — Assessment & Plan Note (Signed)
Continue SBE prophylaxis. Schedule baseline echocardiogram status post aortic valve replacement. 

## 2013-09-01 NOTE — Assessment & Plan Note (Signed)
Continue aspirin and statin. 

## 2013-09-01 NOTE — Assessment & Plan Note (Signed)
Etiology unclear. Check potassium, renal function, BNP and d-dimer. If d-dimer elevated will schedule VQ scan. She does have some renal insufficiency.

## 2013-09-01 NOTE — Assessment & Plan Note (Signed)
Blood pressure controlled.continue present medications. 

## 2013-09-01 NOTE — Patient Instructions (Signed)
Your physician recommends that you schedule a follow-up appointment in: 4-6 WEEKS WITH DR Jens Som  Your physician has requested that you have an echocardiogram. Echocardiography is a painless test that uses sound waves to create images of your heart. It provides your doctor with information about the size and shape of your heart and how well your heart's chambers and valves are working. This procedure takes approximately one hour. There are no restrictions for this procedure.   Your physician recommends that you HAVE LAB WORK TODAY

## 2013-09-01 NOTE — Progress Notes (Signed)
HPI: FU aortic stenosis. I initially saw her in September of 2014. Echocardiogram was performed at that time and showed normal LV function, moderate left ventricular hypertrophy, grade 1 diastolic dysfunction, severe aortic stenosis with a mean gradient of 46 mm of mercury. Nuclear study in October of 2014 showed an ejection fraction of 79% and normal perfusion. Patient underwent cardiac catheterization in October of 2014. She was found to have a 70% LAD and there was a 40-50% mid RCA. Preoperative carotid Dopplers in October 2014 showed 1-39% bilateral stenosis. In October of 2014 the patient underwent aortic valve replacement with a pericardial valve and coronary artery bypassing graft with a LIMA to the LAD. Patient had some confusion and weakness postoperatively. Since her surgery, exertion which is slightly worse. No orthopnea, PND, pedal edema, palpitations, syncope or chest pain. No fevers, chills or productive cough.   Current Outpatient Prescriptions  Medication Sig Dispense Refill  . acetaminophen (TYLENOL) 325 MG tablet Take 650 mg by mouth as needed for pain.       [name] amLODipine (NORVASC) 5 MG tablet Take 1 tablet (5 mg total) by mouth daily.  30 tablet  1  . aspirin 325 MG EC tablet Take 1 tablet (325 mg total) by mouth daily.  30 tablet  0  . bimatoprost (LUMIGAN) 0.01 % SOLN Place 1 drop into both eyes at bedtime.       . Cholecalciferol (VITAMIN D-3 PO) Take 2,000 Units by mouth at bedtime.       . fluticasone (FLOVENT HFA) 44 MCG/ACT inhaler Inhale 2 puffs into the lungs daily.  1 Inhaler  12  . furosemide (LASIX) 20 MG tablet Take 1 tablet (20 mg total) by mouth daily.  60 tablet  1  . Ipratropium-Albuterol (COMBIVENT) 20-100 MCG/ACT AERS respimat Inhale 2 puffs into the lungs 2 (two) times daily.  1 Inhaler  1  . irbesartan (AVAPRO) 75 MG tablet Take 1 tablet (75 mg total) by mouth daily.  30 tablet  1  . metoprolol tartrate (LOPRESSOR) 25 MG tablet Take 1 tablet (25 mg  total) by mouth 2 (two) times daily.  60 tablet  1  . Multiple Vitamins-Minerals (OCUVITE-LUTEIN PO) Take 1 tablet by mouth every morning.       [name] omeprazole (PRILOSEC) 20 MG capsule Take 20 mg by mouth at bedtime.       . potassium chloride (K-DUR) 10 MEQ tablet Take 1 tablet (10 mEq total) by mouth daily.  30 tablet  1  . pravastatin (PRAVACHOL) 40 MG tablet Take 40 mg by mouth daily.       . sertraline (ZOLOFT) 50 MG tablet Take 1 tablet (50 mg total) by mouth every morning.  30 tablet  1  . traMADol (ULTRAM) 50 MG tablet Take 1 tablet (50 mg total) by mouth every 6 (six) hours as needed.  90 tablet  0   No current facility-administered medications for this visit.     Past Medical History  Diagnosis Date  . Hypertension   . Asthma     uses inhaler as needed  . Diabetes mellitus   . GERD (gastroesophageal reflux disease)   . Arthritis   . Anxiety   . Depression   . Hyperlipidemia   . Emphysema of lung   . Glaucoma   . [name]   . Aortic stenosis     Past Surgical History  Procedure Laterality Date  . Ankle surgery  1998  d/t fx.  Left ankle  . Facial cosmetic surgery      face lift  . Nose surgery    . Laparoscopic assisted vaginal hysterectomy  06/21/2011    Procedure: LAPAROSCOPIC ASSISTED VAGINAL HYSTERECTOMY;  Surgeon: Freddy Finner;  Location: [facility];  Service: Gynecology;  Laterality: N/A;  . Salpingoophorectomy  06/21/2011    Procedure: SALPINGO OOPHERECTOMY;  Surgeon: Freddy Finner;  Location: [facility];  Service: Gynecology;  Laterality: Bilateral;  . Anterior and posterior repair  06/21/2011    Procedure: ANTERIOR (CYSTOCELE) AND POSTERIOR REPAIR (RECTOCELE);  Surgeon: Freddy Finner;  Location: [facility];  Service: Gynecology;  Laterality: N/A;  . Vaginal prolapse repair  06/21/2011    Procedure: SACROPEXY;  Surgeon: Freddy Finner;  Location: [facility];  Service: Gynecology;  Laterality: N/A;  sacrospinous ligament suspension  . Cardiac  catheterization    . Aortic valve replacement N/A 07/17/2013    Procedure: AORTIC VALVE REPLACEMENT (AVR);  Surgeon: Kerin Perna, MD;  Location: [facility] OR;  Service: Open Heart Surgery;  Laterality: N/A;  . Coronary artery bypass graft N/A 07/17/2013    Procedure: CORONARY ARTERY BYPASS GRAFT, TIMES ONE, ON PUMP, USING RIGHT INTERNAL MAMMARY ARTERY.;  Surgeon: Kerin Perna, MD;  Location: [facility] OR;  Service: Open Heart Surgery;  Laterality: N/A;  . Intraoperative transesophageal echocardiogram N/A 07/17/2013    Procedure: INTRAOPERATIVE TRANSESOPHAGEAL ECHOCARDIOGRAM;  Surgeon: Kerin Perna, MD;  Location: [facility] OR;  Service: Open Heart Surgery;  Laterality: N/A;  . Abdominal hysterectomy    . Cardiac valve replacement    . Coronary angioplasty      History   Social History  . Marital Status: Married    Spouse Name: [name]    Number of Children: 1  . Years of Education: N/A   Occupational History  . retired [employer] operational officer   .     Social History Main Topics  . Smoking status: Former Smoker -- 2.00 packs/day for 15 years    Types: Cigarettes    Quit date: 09/18/1972  . Smokeless tobacco: Never Used  . Alcohol Use: No  . Drug Use: No  . Sexual Activity: Not on file   Other Topics Concern  . Not on file   Social History Narrative  . No narrative on file    ROS: no fevers or chills, productive cough, hemoptysis, dysphasia, odynophagia, melena, hematochezia, dysuria, hematuria, rash, seizure activity, orthopnea, PND, pedal edema, claudication. Remaining systems are negative.  Physical Exam: Well-developed well-nourished in no acute distress.  Skin is warm and dry.  HEENT is normal.  Neck is supple.  Chest is clear to auscultation with normal expansion.  Cardiovascular exam is regular rate and rhythm. 2/6 systolic murmur left sternal border. No diastolic murmur. Abdominal exam nontender or distended. No masses palpated. Extremities show no edema. neuro grossly intact

## 2013-09-02 ENCOUNTER — Encounter (HOSPITAL_COMMUNITY)
Admission: RE | Admit: 2013-09-02 | Discharge: 2013-09-02 | Disposition: A | Discharge status: Home / Self Care | Payer: Medicare Other | Source: Ambulatory Visit | Attending: Cardiology | Admitting: Cardiology

## 2013-09-02 ENCOUNTER — Ambulatory Visit (HOSPITAL_COMMUNITY)
Admission: RE | Admit: 2013-09-02 | Discharge: 2013-09-02 | Disposition: A | Discharge status: Home / Self Care | Payer: Medicare Other | Source: Ambulatory Visit | Attending: Cardiology | Admitting: Cardiology

## 2013-09-02 DIAGNOSIS — R7989 Other specified abnormal findings of blood chemistry: Secondary | ICD-10-CM

## 2013-09-02 DIAGNOSIS — R0602 Shortness of breath: Secondary | ICD-10-CM

## 2013-09-02 DIAGNOSIS — J449 Chronic obstructive pulmonary disease, unspecified: Secondary | ICD-10-CM | POA: Insufficient documentation

## 2013-09-02 DIAGNOSIS — J4489 Other specified chronic obstructive pulmonary disease: Secondary | ICD-10-CM | POA: Insufficient documentation

## 2013-09-02 LAB — BRAIN NATRIURETIC PEPTIDE: Pro B Natriuretic peptide (BNP): 229 pg/mL — ABNORMAL HIGH (ref 0.0–100.0)

## 2013-09-02 MED ORDER — TECHNETIUM TO 99M ALBUMIN AGGREGATED
6.0000 | Freq: Once | INTRAVENOUS | Status: AC | PRN
  Administered 2013-09-02: 6 via INTRAVENOUS

## 2013-09-02 MED ORDER — TECHNETIUM TC 99M DIETHYLENETRIAME-PENTAACETIC ACID: 40.0000 | Freq: Once | INTRAVENOUS | Status: AC | PRN

## 2013-09-03 ENCOUNTER — Telehealth: Payer: Self-pay | Admitting: Cardiology

## 2013-09-03 DIAGNOSIS — R0600 Dyspnea, unspecified: Secondary | ICD-10-CM

## 2013-09-03 DIAGNOSIS — Z952 Presence of prosthetic heart valve: Secondary | ICD-10-CM

## 2013-09-03 DIAGNOSIS — I350 Nonrheumatic aortic (valve) stenosis: Secondary | ICD-10-CM

## 2013-09-03 NOTE — Telephone Encounter (Signed)
Spoke with pt, aware of cxr, vq scan and blood work results. Aware of med change and need for repeat labs

## 2013-09-03 NOTE — Telephone Encounter (Signed)
New problem    Pt has questions about X-Ray's done 12/16, please give her a call back.

## 2013-09-04 ENCOUNTER — Encounter (HOSPITAL_COMMUNITY)
Admission: RE | Admit: 2013-09-04 | Discharge: 2013-09-04 | Disposition: A | Discharge status: Home / Self Care | Payer: Medicare Other | Source: Ambulatory Visit | Attending: Cardiology | Admitting: Cardiology

## 2013-09-04 DIAGNOSIS — I2510 Atherosclerotic heart disease of native coronary artery without angina pectoris: Secondary | ICD-10-CM | POA: Insufficient documentation

## 2013-09-04 DIAGNOSIS — J438 Other emphysema: Secondary | ICD-10-CM | POA: Insufficient documentation

## 2013-09-04 DIAGNOSIS — Z952 Presence of prosthetic heart valve: Secondary | ICD-10-CM | POA: Insufficient documentation

## 2013-09-04 DIAGNOSIS — Z5189 Encounter for other specified aftercare: Secondary | ICD-10-CM | POA: Insufficient documentation

## 2013-09-04 DIAGNOSIS — E785 Hyperlipidemia, unspecified: Secondary | ICD-10-CM | POA: Insufficient documentation

## 2013-09-04 DIAGNOSIS — I700 Atherosclerosis of aorta: Secondary | ICD-10-CM | POA: Insufficient documentation

## 2013-09-04 DIAGNOSIS — I359 Nonrheumatic aortic valve disorder, unspecified: Secondary | ICD-10-CM | POA: Insufficient documentation

## 2013-09-04 DIAGNOSIS — I509 Heart failure, unspecified: Secondary | ICD-10-CM | POA: Insufficient documentation

## 2013-09-04 DIAGNOSIS — Z951 Presence of aortocoronary bypass graft: Secondary | ICD-10-CM | POA: Insufficient documentation

## 2013-09-04 DIAGNOSIS — Z836 Family history of other diseases of the respiratory system: Secondary | ICD-10-CM | POA: Insufficient documentation

## 2013-09-04 DIAGNOSIS — Z79899 Other long term (current) drug therapy: Secondary | ICD-10-CM | POA: Insufficient documentation

## 2013-09-04 DIAGNOSIS — I10 Essential (primary) hypertension: Secondary | ICD-10-CM | POA: Insufficient documentation

## 2013-09-04 NOTE — Progress Notes (Signed)
Cardiac Rehab Medication Review by a Pharmacist  Does the patient  feel that his/her medications are working for him/her?  Patient has no idea  Has the patient been experiencing any side effects to the medications prescribed?  no  Does the patient measure his/her own blood pressure or blood glucose at home?  yes   Does the patient have any problems obtaining medications due to transportation or finances?   no  Understanding of regimen: excellent Understanding of indications: excellent Potential of compliance: excellent    Pharmacist comments: Patient does not have refills on Combivent, but will check with primary care physician for refills. Patient sometimes uses saline nasal spray.  Patient uses cream for back pain.   Patient was previously on Metformin, but stopped recently due to problems with kidneys.  Per MD kidneys are recovering and blood glucose has been increasing.  Patient has appointment with MD in a few days to follow-up, but may call today to talk about metformin and combivent.  Overall, patient seems compliant and aware of reasons to continue on medications.    Thank you, Piedad Climes, PharmD Clinical Pharmacist - Resident Pager: [phone] Pharmacy: [phone] 09/04/2013 9:04 AM

## 2013-09-05 ENCOUNTER — Telehealth (HOSPITAL_COMMUNITY): Payer: Self-pay | Admitting: *Deleted

## 2013-09-05 NOTE — Telephone Encounter (Signed)
Message copied by Chelsea Aus on Fri Sep 05, 2013  7:31 AM ------      Message from: Lewayne Bunting      Created: Wed Sep 03, 2013  9:18 PM      Regarding: RE: ok to use O2 prn for Cardiac Rehab       yes      ----- Message -----         From: Arna Medici, RN         Sent: 09/03/2013   4:58 PM           To: Deliah Goody, RN, Lewayne Bunting, MD      Subject: ok to use O2 prn for Cardiac Rehab                        Pt coming to orientation on 12/18 s/p CABG x 1 and AVR 10/30.  Completed nursing interview with by phone.  Pt uses O2 therapy- 2LNC  at home prn.  Questioned "prn" - pt uses it every night.   We will assess  O2  saturation during exercise. May we initiate O2 therapy to maintain O2 sat > or equal 93%?            Thanks       [employer] RN       ------

## 2013-09-08 ENCOUNTER — Encounter (HOSPITAL_COMMUNITY)
Admission: RE | Admit: 2013-09-08 | Discharge: 2013-09-08 | Disposition: A | Discharge status: Home / Self Care | Payer: Medicare Other | Source: Ambulatory Visit | Attending: Cardiology | Admitting: Cardiology

## 2013-09-08 NOTE — Progress Notes (Signed)
Pt in today for first day of cardiac rehab phase II Program.  Pt is deconditioned and has limited activity since her hospitalization.  Pt pre exercise O2 saturation was 95%.  Pt reports that she wears O2 therapy at [facility] at bedtime.  Pt was not using O2 therapy prior to her surgery.  Pt o2 saturation checked on first station 88.  Pt denied any shortness of breath.Pt saturation increased to 89-90 with rest.  O2 placed on at [facility] to maintain o2 sats.  Pt oxygen level immediately increased to 95%.  Pt maintained O2 levels at 95% during exercise including ambulating around the track.  Pt surprised at pre glucose reading 190.  Pt did take glucophage prior to her surgery but this was not restarted post hospitalization due to hypoglycemia.  Pt states her readings at home have not been high.  Asked pt to bring her meter with her to exercise on Wednesday to check against rehab meter.  Monitor showed SR with negative qrs and no noted ectopy.  PHQ2 score 0.  Pt expressed that she enjoyed herself today and is looking forward to returning on Wednesday.

## 2013-09-10 ENCOUNTER — Other Ambulatory Visit (INDEPENDENT_AMBULATORY_CARE_PROVIDER_SITE_OTHER): Payer: Medicare Other

## 2013-09-10 ENCOUNTER — Encounter (HOSPITAL_COMMUNITY)
Admission: RE | Admit: 2013-09-10 | Discharge: 2013-09-10 | Disposition: A | Discharge status: Home / Self Care | Payer: Medicare Other | Source: Ambulatory Visit | Attending: Cardiology | Admitting: Cardiology

## 2013-09-10 DIAGNOSIS — Z952 Presence of prosthetic heart valve: Secondary | ICD-10-CM

## 2013-09-10 DIAGNOSIS — R0609 Other forms of dyspnea: Secondary | ICD-10-CM

## 2013-09-10 DIAGNOSIS — R0600 Dyspnea, unspecified: Secondary | ICD-10-CM

## 2013-09-10 DIAGNOSIS — Z954 Presence of other heart-valve replacement: Secondary | ICD-10-CM

## 2013-09-10 DIAGNOSIS — I359 Nonrheumatic aortic valve disorder, unspecified: Secondary | ICD-10-CM

## 2013-09-10 DIAGNOSIS — I350 Nonrheumatic aortic (valve) stenosis: Secondary | ICD-10-CM

## 2013-09-10 LAB — BASIC METABOLIC PANEL
CO2: 21 mEq/L (ref 19–32)
Calcium: 9.1 mg/dL (ref 8.4–10.5)
Chloride: 110 mEq/L (ref 96–112)
Creatinine, Ser: 1.2 mg/dL (ref 0.4–1.2)
Glucose, Bld: 154 mg/dL — ABNORMAL HIGH (ref 70–99)
Sodium: 139 mEq/L (ref 135–145)

## 2013-09-10 LAB — GLUCOSE, CAPILLARY: Glucose-Capillary: 173 mg/dL — ABNORMAL HIGH (ref 70–99)

## 2013-09-10 LAB — BRAIN NATRIURETIC PEPTIDE: Pro B Natriuretic peptide (BNP): 407 pg/mL — ABNORMAL HIGH (ref 0.0–100.0)

## 2013-09-10 NOTE — Progress Notes (Signed)
Pt returned to exercise for her second session.  Pt pre exercise blood glucose was 172. Pt ate breakfast about an hour ago. Pt concerned due to recent change to discontinue her glucophage post hospitalization. Pt advised to monitor her blood glucose readings fasting for the next couple of days.  Pt to bring her finding in for rehab staff to review.  Pt has follow up with Laurann Montana in couple of weeks.   Pt o2 sat while ambulating on track decreased to 89% on 2LNC. Pt had not taken a rest break and had ambulated 5 laps. O2 increased to 3lnc while on the track and encouraged rest breaks after each lap.

## 2013-09-12 ENCOUNTER — Telehealth: Payer: Self-pay | Admitting: Cardiology

## 2013-09-12 ENCOUNTER — Other Ambulatory Visit: Payer: Self-pay | Admitting: *Deleted

## 2013-09-12 DIAGNOSIS — I10 Essential (primary) hypertension: Secondary | ICD-10-CM

## 2013-09-12 NOTE — Telephone Encounter (Signed)
Follow Up  ° °Pt returned call for results °

## 2013-09-12 NOTE — Telephone Encounter (Signed)
Notified of lab results. 

## 2013-09-15 ENCOUNTER — Encounter (HOSPITAL_COMMUNITY)
Admission: RE | Admit: 2013-09-15 | Discharge: 2013-09-15 | Disposition: A | Discharge status: Home / Self Care | Payer: Medicare Other | Source: Ambulatory Visit | Attending: Cardiology | Admitting: Cardiology

## 2013-09-15 LAB — GLUCOSE, CAPILLARY
Glucose-Capillary: 209 mg/dL — ABNORMAL HIGH (ref 70–99)
Glucose-Capillary: 201 mg/dL — ABNORMAL HIGH (ref 70–99)

## 2013-09-15 NOTE — Progress Notes (Signed)
[name]'s blood sugar's have ranged from 170-209 at cardiac rehab non fasting. Maize is not on any diabetic medication at this time. Will send Berit's blood sugars to Dr Cliffton Asters to review.

## 2013-09-17 ENCOUNTER — Encounter (HOSPITAL_COMMUNITY)
Admission: RE | Admit: 2013-09-17 | Discharge: 2013-09-17 | Disposition: A | Discharge status: Home / Self Care | Payer: Medicare Other | Source: Ambulatory Visit | Attending: Cardiology | Admitting: Cardiology

## 2013-09-17 ENCOUNTER — Encounter: Payer: Self-pay | Admitting: Cardiology

## 2013-09-17 ENCOUNTER — Ambulatory Visit (HOSPITAL_COMMUNITY): Payer: Medicare Other | Attending: Cardiology | Admitting: Cardiology

## 2013-09-17 DIAGNOSIS — R0600 Dyspnea, unspecified: Secondary | ICD-10-CM

## 2013-09-17 DIAGNOSIS — Z954 Presence of other heart-valve replacement: Secondary | ICD-10-CM | POA: Insufficient documentation

## 2013-09-17 DIAGNOSIS — E785 Hyperlipidemia, unspecified: Secondary | ICD-10-CM | POA: Insufficient documentation

## 2013-09-17 DIAGNOSIS — R0609 Other forms of dyspnea: Secondary | ICD-10-CM | POA: Insufficient documentation

## 2013-09-17 DIAGNOSIS — I10 Essential (primary) hypertension: Secondary | ICD-10-CM | POA: Insufficient documentation

## 2013-09-17 DIAGNOSIS — E119 Type 2 diabetes mellitus without complications: Secondary | ICD-10-CM | POA: Insufficient documentation

## 2013-09-17 DIAGNOSIS — R0989 Other specified symptoms and signs involving the circulatory and respiratory systems: Secondary | ICD-10-CM | POA: Insufficient documentation

## 2013-09-17 DIAGNOSIS — Z952 Presence of prosthetic heart valve: Secondary | ICD-10-CM

## 2013-09-17 DIAGNOSIS — R0602 Shortness of breath: Secondary | ICD-10-CM

## 2013-09-17 DIAGNOSIS — Z87891 Personal history of nicotine dependence: Secondary | ICD-10-CM | POA: Insufficient documentation

## 2013-09-17 DIAGNOSIS — I079 Rheumatic tricuspid valve disease, unspecified: Secondary | ICD-10-CM | POA: Insufficient documentation

## 2013-09-17 DIAGNOSIS — I059 Rheumatic mitral valve disease, unspecified: Secondary | ICD-10-CM | POA: Insufficient documentation

## 2013-09-17 LAB — GLUCOSE, CAPILLARY: Glucose-Capillary: 131 mg/dL — ABNORMAL HIGH (ref 70–99)

## 2013-09-17 NOTE — Progress Notes (Signed)
Echo performed. 

## 2013-09-19 ENCOUNTER — Other Ambulatory Visit (INDEPENDENT_AMBULATORY_CARE_PROVIDER_SITE_OTHER): Payer: Medicare Other

## 2013-09-19 ENCOUNTER — Encounter (HOSPITAL_COMMUNITY)
Admission: RE | Admit: 2013-09-19 | Discharge: 2013-09-19 | Disposition: A | Discharge status: Home / Self Care | Payer: Medicare Other | Source: Ambulatory Visit | Attending: Cardiology | Admitting: Cardiology

## 2013-09-19 DIAGNOSIS — Z5189 Encounter for other specified aftercare: Secondary | ICD-10-CM | POA: Insufficient documentation

## 2013-09-19 DIAGNOSIS — E785 Hyperlipidemia, unspecified: Secondary | ICD-10-CM | POA: Insufficient documentation

## 2013-09-19 DIAGNOSIS — I509 Heart failure, unspecified: Secondary | ICD-10-CM | POA: Insufficient documentation

## 2013-09-19 DIAGNOSIS — I10 Essential (primary) hypertension: Secondary | ICD-10-CM | POA: Insufficient documentation

## 2013-09-19 DIAGNOSIS — Z836 Family history of other diseases of the respiratory system: Secondary | ICD-10-CM | POA: Insufficient documentation

## 2013-09-19 DIAGNOSIS — I359 Nonrheumatic aortic valve disorder, unspecified: Secondary | ICD-10-CM | POA: Insufficient documentation

## 2013-09-19 DIAGNOSIS — I2510 Atherosclerotic heart disease of native coronary artery without angina pectoris: Secondary | ICD-10-CM | POA: Insufficient documentation

## 2013-09-19 DIAGNOSIS — I700 Atherosclerosis of aorta: Secondary | ICD-10-CM | POA: Insufficient documentation

## 2013-09-19 DIAGNOSIS — J438 Other emphysema: Secondary | ICD-10-CM | POA: Insufficient documentation

## 2013-09-19 DIAGNOSIS — Z952 Presence of prosthetic heart valve: Secondary | ICD-10-CM | POA: Insufficient documentation

## 2013-09-19 DIAGNOSIS — Z79899 Other long term (current) drug therapy: Secondary | ICD-10-CM | POA: Insufficient documentation

## 2013-09-19 DIAGNOSIS — Z951 Presence of aortocoronary bypass graft: Secondary | ICD-10-CM | POA: Insufficient documentation

## 2013-09-19 LAB — BASIC METABOLIC PANEL
BUN: 26 mg/dL — AB (ref 6–23)
CHLORIDE: 106 meq/L (ref 96–112)
CO2: 23 mEq/L (ref 19–32)
CREATININE: 1.2 mg/dL (ref 0.4–1.2)
Calcium: 8.9 mg/dL (ref 8.4–10.5)
GFR: 45.19 mL/min — ABNORMAL LOW (ref >60.00)
GLUCOSE: 199 mg/dL — AB (ref 70–99)
POTASSIUM: 3.2 meq/L — AB (ref 3.5–5.1)
Sodium: 138 mEq/L (ref 135–145)

## 2013-09-22 ENCOUNTER — Encounter (HOSPITAL_COMMUNITY)
Admission: RE | Admit: 2013-09-22 | Discharge: 2013-09-22 | Disposition: A | Discharge status: Home / Self Care | Payer: Medicare Other | Source: Ambulatory Visit | Attending: Cardiology | Admitting: Cardiology

## 2013-09-22 NOTE — Progress Notes (Signed)
Reviewed home exercise with pt today.  Pt plans to walk at home or at the mall for exercise, using her oxygen.  Reviewed THR, pulse, RPE, sign and symptoms, and when to call 911 or MD.  Pt encouraged to purchase a pulse oximeter for home use.  Pt voiced understanding. Alberteen Sam, MA, ACSM RCEP

## 2013-09-23 ENCOUNTER — Telehealth: Payer: Self-pay | Admitting: Cardiology

## 2013-09-23 DIAGNOSIS — E876 Hypokalemia: Secondary | ICD-10-CM

## 2013-09-23 MED ORDER — IRBESARTAN 150 MG PO TABS: 75.0000 mg | ORAL_TABLET | Freq: Every day | ORAL | Status: DC

## 2013-09-23 MED ORDER — METOPROLOL TARTRATE 25 MG PO TABS: 25.0000 mg | ORAL_TABLET | Freq: Two times a day (BID) | ORAL | Status: DC

## 2013-09-23 MED ORDER — POTASSIUM CHLORIDE CRYS ER 10 MEQ PO TBCR: EXTENDED_RELEASE_TABLET | ORAL | Status: DC

## 2013-09-23 NOTE — Telephone Encounter (Signed)
Follow Up  ° °Pt returned call//SR  °

## 2013-09-23 NOTE — Telephone Encounter (Signed)
Spoke with pt, aware of low potassium. She takes her furosemide every other day. The potassium tablets she has at home are 10 meq, therefore; she will take 2 tablets every other day when she takes her furosemide. She will return for labs in 2 weeks. Patient voiced understanding

## 2013-09-24 ENCOUNTER — Encounter (HOSPITAL_COMMUNITY)
Admission: RE | Admit: 2013-09-24 | Discharge: 2013-09-24 | Disposition: A | Discharge status: Home / Self Care | Payer: Medicare Other | Source: Ambulatory Visit | Attending: Cardiology | Admitting: Cardiology

## 2013-09-24 NOTE — Progress Notes (Signed)
[name] 80 y.o. female Nutrition Note Spoke with pt.  Nutrition Plan and Nutrition Survey goals reviewed with pt. Pt is following Step 1 of the Therapeutic Lifestyle Changes diet. Per nutrition screen, pt wanted to lose wt. Pt reports she now weighs "11.something, which is where I want to be." Pt is a diet-controlled diabetic. Last A1c indicates blood glucose well-controlled. Pt checks fasting CBG's daily. CBG's reportedly 120-123 mg/dL. This Probation officer went over Diabetes Education test results. Pt expressed understanding of the information reviewed. Pt aware of nutrition education classes offered and plans on attending the Diabetes Blitz class.  Nutrition Diagnosis   Food-and nutrition-related knowledge deficit related to lack of exposure to information as related to diagnosis of: ? CVD ? DM (A1c 6.6)   Nutrition RX/ Re-Estimated Daily Nutrition Needs for: wt maintenance 1300-1500 Kcal, 40-50 gm fat, 8-10 gm sat fat, 1.3-1.5 gm trans-fat, <1500 mg sodium, 175 gm CHO   Nutrition Intervention   Pt's individual nutrition plan reviewed with pt.   Pt to discuss decreasing frequency of CBG checks with MD given pt is a diet-controlled DM   Benefits of adopting Therapeutic Lifestyle Changes discussed when Medficts reviewed.   Pt to attend the Portion Distortion class   Pt to attend the  ? Diabetes Blitz class                      ? Diabetes Q & A class   Pt given handouts for: ? Nutrition I class ? Nutrition II class   Continue client-centered nutrition education by RD, as part of interdisciplinary care. Goal(s)   Pt to describe the benefit of including fruits, vegetables, whole grains, and low-fat dairy products in a heart healthy meal plan. Monitor and Evaluate progress toward nutrition goal with team. Nutrition Risk: Change to Moderate Derek Mound, M.Ed, RD, LDN, CDE 09/24/2013 11:04 AM

## 2013-09-26 ENCOUNTER — Encounter (HOSPITAL_COMMUNITY)
Admission: RE | Admit: 2013-09-26 | Discharge: 2013-09-26 | Disposition: A | Discharge status: Home / Self Care | Payer: Medicare Other | Source: Ambulatory Visit | Attending: Cardiology | Admitting: Cardiology

## 2013-09-29 ENCOUNTER — Encounter (HOSPITAL_COMMUNITY)
Admission: RE | Admit: 2013-09-29 | Discharge: 2013-09-29 | Disposition: A | Discharge status: Home / Self Care | Payer: Medicare Other | Source: Ambulatory Visit | Attending: Cardiology | Admitting: Cardiology

## 2013-09-29 NOTE — Progress Notes (Signed)
Reviewed [name]'s quality of life questionnaire.

## 2013-09-30 ENCOUNTER — Other Ambulatory Visit: Payer: Self-pay | Admitting: *Deleted

## 2013-09-30 MED ORDER — METOPROLOL TARTRATE 25 MG PO TABS: 25.0000 mg | ORAL_TABLET | Freq: Two times a day (BID) | ORAL | Status: DC

## 2013-09-30 MED ORDER — POTASSIUM CHLORIDE CRYS ER 10 MEQ PO TBCR: EXTENDED_RELEASE_TABLET | ORAL | Status: DC

## 2013-09-30 MED ORDER — AMLODIPINE BESYLATE 5 MG PO TABS: 5.0000 mg | ORAL_TABLET | Freq: Every day | ORAL | Status: DC

## 2013-09-30 MED ORDER — IRBESARTAN 150 MG PO TABS: 75.0000 mg | ORAL_TABLET | Freq: Every day | ORAL | Status: DC

## 2013-10-01 ENCOUNTER — Encounter (HOSPITAL_COMMUNITY)
Admission: RE | Admit: 2013-10-01 | Discharge: 2013-10-01 | Disposition: A | Discharge status: Home / Self Care | Payer: Medicare Other | Source: Ambulatory Visit | Attending: Cardiology | Admitting: Cardiology

## 2013-10-03 ENCOUNTER — Encounter (HOSPITAL_COMMUNITY)
Admission: RE | Admit: 2013-10-03 | Discharge: 2013-10-03 | Disposition: A | Discharge status: Home / Self Care | Payer: Medicare Other | Source: Ambulatory Visit | Attending: Cardiology | Admitting: Cardiology

## 2013-10-03 LAB — GLUCOSE, CAPILLARY: Glucose-Capillary: 170 mg/dL — ABNORMAL HIGH (ref 70–99)

## 2013-10-06 ENCOUNTER — Other Ambulatory Visit: Payer: Self-pay | Admitting: *Deleted

## 2013-10-06 ENCOUNTER — Encounter (HOSPITAL_COMMUNITY)
Admission: RE | Admit: 2013-10-06 | Discharge: 2013-10-06 | Disposition: A | Discharge status: Home / Self Care | Payer: Medicare Other | Source: Ambulatory Visit | Attending: Cardiology | Admitting: Cardiology

## 2013-10-06 DIAGNOSIS — I359 Nonrheumatic aortic valve disorder, unspecified: Secondary | ICD-10-CM

## 2013-10-06 DIAGNOSIS — I2510 Atherosclerotic heart disease of native coronary artery without angina pectoris: Secondary | ICD-10-CM

## 2013-10-07 ENCOUNTER — Other Ambulatory Visit (INDEPENDENT_AMBULATORY_CARE_PROVIDER_SITE_OTHER): Payer: Medicare Other

## 2013-10-07 ENCOUNTER — Encounter: Payer: Self-pay | Admitting: Cardiology

## 2013-10-07 ENCOUNTER — Ambulatory Visit (INDEPENDENT_AMBULATORY_CARE_PROVIDER_SITE_OTHER): Payer: Medicare Other | Admitting: Cardiology

## 2013-10-07 VITALS — BP 150/70 | HR 58 | Ht 60.0 in | Wt 113.1 lb

## 2013-10-07 DIAGNOSIS — I10 Essential (primary) hypertension: Secondary | ICD-10-CM

## 2013-10-07 DIAGNOSIS — R0600 Dyspnea, unspecified: Secondary | ICD-10-CM

## 2013-10-07 DIAGNOSIS — E785 Hyperlipidemia, unspecified: Secondary | ICD-10-CM

## 2013-10-07 DIAGNOSIS — R0609 Other forms of dyspnea: Secondary | ICD-10-CM

## 2013-10-07 DIAGNOSIS — Z952 Presence of prosthetic heart valve: Secondary | ICD-10-CM

## 2013-10-07 DIAGNOSIS — Z951 Presence of aortocoronary bypass graft: Secondary | ICD-10-CM

## 2013-10-07 DIAGNOSIS — Z954 Presence of other heart-valve replacement: Secondary | ICD-10-CM

## 2013-10-07 DIAGNOSIS — E876 Hypokalemia: Secondary | ICD-10-CM

## 2013-10-07 DIAGNOSIS — R0989 Other specified symptoms and signs involving the circulatory and respiratory systems: Secondary | ICD-10-CM

## 2013-10-07 LAB — BASIC METABOLIC PANEL
BUN: 32 mg/dL — AB (ref 6–23)
CHLORIDE: 106 meq/L (ref 96–112)
CO2: 21 meq/L (ref 19–32)
CREATININE: 1.2 mg/dL (ref 0.4–1.2)
Calcium: 9 mg/dL (ref 8.4–10.5)
GFR: 45.61 mL/min — ABNORMAL LOW (ref >60.00)
Glucose, Bld: 134 mg/dL — ABNORMAL HIGH (ref 70–99)
Potassium: 4.1 mEq/L (ref 3.5–5.1)
Sodium: 136 mEq/L (ref 135–145)

## 2013-10-07 NOTE — Patient Instructions (Signed)
Your physician wants you to follow-up in: 6 MONTHS WITH DR CRENSHAW You will receive a reminder letter in the mail two months in advance. If you don't receive a letter, please call our office to schedule the follow-up appointment.  

## 2013-10-07 NOTE — Progress Notes (Signed)
HPI: FU aortic stenosis. I initially saw her in September of 2014. Echocardiogram was performed at that time and showed normal LV function, moderate left ventricular hypertrophy, grade 1 diastolic dysfunction, severe aortic stenosis with a mean gradient of 46 mm of mercury. Nuclear study in October of 2014 showed an ejection fraction of 79% and normal perfusion. Patient underwent cardiac catheterization in October of 2014. She was found to have a 70% LAD and there was a 40-50% mid RCA. Preoperative carotid Dopplers in October 2014 showed 1-39% bilateral stenosis. In October of 2014 the patient underwent aortic valve replacement with a pericardial valve and coronary artery bypassing graft with a LIMA to the LAD. Echocardiogram in December of 2014 showed normal LV function, grade 1 diastolic dysfunction, bioprosthetic aortic valve with a mean gradient of 19 mm of mercury, mild left atrial enlargement. When I last saw her she was complaining of dyspnea. D-dimer mildly elevated. There were several matched defects in the upper lobes making the study indeterminate for pulmonary embolus. Findings felt most consistent with COPD. BNP 229 and BUN and creatinine were elevated. Lasix was reduced. Since I last saw her, she has some dyspnea on exertion but improved. No orthopnea, PND, pedal edema or chest pain.   Current Outpatient Prescriptions  Medication Sig Dispense Refill  . acetaminophen (TYLENOL) 325 MG tablet Take 650 mg by mouth as needed for pain.       [name] amLODipine (NORVASC) 5 MG tablet Take 1 tablet (5 mg total) by mouth daily.  90 tablet  1  . aspirin 325 MG EC tablet Take 1 tablet (325 mg total) by mouth daily.  30 tablet  0  . bimatoprost (LUMIGAN) 0.01 % SOLN Place 1 drop into both eyes at bedtime.       . fluticasone (FLOVENT HFA) 44 MCG/ACT inhaler Inhale 2 puffs into the lungs daily.  1 Inhaler  12  . Ipratropium-Albuterol (COMBIVENT) 20-100 MCG/ACT AERS respimat Inhale 2 puffs into the  lungs 2 (two) times daily.  1 Inhaler  1  . irbesartan (AVAPRO) 150 MG tablet Take 0.5 tablets (75 mg total) by mouth daily.  45 tablet  1  . metoprolol tartrate (LOPRESSOR) 25 MG tablet Take 1 tablet (25 mg total) by mouth 2 (two) times daily.  180 tablet  1  . Multiple Vitamins-Minerals (OCUVITE-LUTEIN PO) Take 1 tablet by mouth every morning.       . potassium chloride (K-DUR,KLOR-CON) 10 MEQ tablet Take two tablets with the furosemide every other day  90 tablet  1  . pravastatin (PRAVACHOL) 40 MG tablet Take 40 mg by mouth daily.       . sertraline (ZOLOFT) 50 MG tablet Take 1 tablet (50 mg total) by mouth every morning.  30 tablet  1   No current facility-administered medications for this visit.     Past Medical History  Diagnosis Date  . Hypertension   . Asthma     uses inhaler as needed  . Diabetes mellitus   . GERD (gastroesophageal reflux disease)   . Arthritis   . Anxiety   . Depression   . Hyperlipidemia   . Emphysema of lung   . Glaucoma   . [name]   . Aortic stenosis     Past Surgical History  Procedure Laterality Date  . Ankle surgery  1998    d/t fx.  Left ankle  . Facial cosmetic surgery      face lift  .  Nose surgery    . Laparoscopic assisted vaginal hysterectomy  06/21/2011    Procedure: LAPAROSCOPIC ASSISTED VAGINAL HYSTERECTOMY;  Surgeon: Maisie Fus;  Location: [facility];  Service: Gynecology;  Laterality: N/A;  . Salpingoophorectomy  06/21/2011    Procedure: SALPINGO OOPHERECTOMY;  Surgeon: Maisie Fus;  Location: [facility];  Service: Gynecology;  Laterality: Bilateral;  . Anterior and posterior repair  06/21/2011    Procedure: ANTERIOR (CYSTOCELE) AND POSTERIOR REPAIR (RECTOCELE);  Surgeon: Maisie Fus;  Location: [facility];  Service: Gynecology;  Laterality: N/A;  . Vaginal prolapse repair  06/21/2011    Procedure: SACROPEXY;  Surgeon: Maisie Fus;  Location: [facility];  Service: Gynecology;  Laterality: N/A;  sacrospinous ligament  suspension  . Cardiac catheterization    . Aortic valve replacement N/A 07/17/2013    Procedure: AORTIC VALVE REPLACEMENT (AVR);  Surgeon: Ivin Poot, MD;  Location: [facility];  Service: Open Heart Surgery;  Laterality: N/A;  . Coronary artery bypass graft N/A 07/17/2013    Procedure: CORONARY ARTERY BYPASS GRAFT, TIMES ONE, ON PUMP, USING RIGHT INTERNAL MAMMARY ARTERY.;  Surgeon: Ivin Poot, MD;  Location: [facility];  Service: Open Heart Surgery;  Laterality: N/A;  . Intraoperative transesophageal echocardiogram N/A 07/17/2013    Procedure: INTRAOPERATIVE TRANSESOPHAGEAL ECHOCARDIOGRAM;  Surgeon: Ivin Poot, MD;  Location: [facility];  Service: Open Heart Surgery;  Laterality: N/A;  . Abdominal hysterectomy    . Cardiac valve replacement    . Coronary angioplasty      History   Social History  . Marital Status: Married    Spouse Name: Jenny Reichmann    Number of Children: 1  . Years of Education: N/A   Occupational History  . retired Network engineer   .     Social History Main Topics  . Smoking status: Former Smoker -- 2.00 packs/day for 15 years    Types: Cigarettes    Quit date: 09/18/1972  . Smokeless tobacco: Never Used  . Alcohol Use: No  . Drug Use: No  . Sexual Activity: Not on file   Other Topics Concern  . Not on file   Social History Narrative  . No narrative on file    ROS: no fevers or chills, productive cough, hemoptysis, dysphasia, odynophagia, melena, hematochezia, dysuria, hematuria, rash, seizure activity, orthopnea, PND, pedal edema, claudication. Remaining systems are negative.  Physical Exam: Well-developed well-nourished in no acute distress.  Skin is warm and dry.  HEENT is normal.  Neck is supple.  Chest is clear to auscultation with normal expansion.  Cardiovascular exam is regular rate and rhythm.  Abdominal exam nontender or distended. No masses palpated. Extremities show no edema. neuro grossly intact

## 2013-10-07 NOTE — Assessment & Plan Note (Signed)
Blood pressure mildly elevated but she follows this closely and it is typically controlled. Continue present medications. She had a repeat BMET this a.m. and we will await results.

## 2013-10-07 NOTE — Assessment & Plan Note (Signed)
Continue statin. 

## 2013-10-07 NOTE — Assessment & Plan Note (Signed)
Euvolemic on examination. Continue present dose of Lasix. Probable contribution from emphysema.

## 2013-10-07 NOTE — Assessment & Plan Note (Signed)
Continue SBE prophylaxis. 

## 2013-10-07 NOTE — Assessment & Plan Note (Signed)
Continue aspirin and statin. 

## 2013-10-08 ENCOUNTER — Encounter (HOSPITAL_COMMUNITY)
Admission: RE | Admit: 2013-10-08 | Discharge: 2013-10-08 | Disposition: A | Discharge status: Home / Self Care | Payer: Medicare Other | Source: Ambulatory Visit | Attending: Cardiology | Admitting: Cardiology

## 2013-10-08 ENCOUNTER — Ambulatory Visit (INDEPENDENT_AMBULATORY_CARE_PROVIDER_SITE_OTHER): Payer: Self-pay | Admitting: Cardiothoracic Surgery

## 2013-10-08 ENCOUNTER — Encounter: Payer: Self-pay | Admitting: Cardiothoracic Surgery

## 2013-10-08 ENCOUNTER — Ambulatory Visit
Admission: RE | Admit: 2013-10-08 | Discharge: 2013-10-08 | Disposition: A | Discharge status: Home / Self Care | Payer: Medicare Other | Source: Ambulatory Visit | Attending: Cardiothoracic Surgery | Admitting: Cardiothoracic Surgery

## 2013-10-08 VITALS — BP 141/79 | HR 67 | Resp 16 | Ht 60.0 in | Wt 113.0 lb

## 2013-10-08 DIAGNOSIS — I359 Nonrheumatic aortic valve disorder, unspecified: Secondary | ICD-10-CM

## 2013-10-08 DIAGNOSIS — Z952 Presence of prosthetic heart valve: Secondary | ICD-10-CM

## 2013-10-08 DIAGNOSIS — I2510 Atherosclerotic heart disease of native coronary artery without angina pectoris: Secondary | ICD-10-CM

## 2013-10-08 DIAGNOSIS — Z951 Presence of aortocoronary bypass graft: Secondary | ICD-10-CM

## 2013-10-08 DIAGNOSIS — Z954 Presence of other heart-valve replacement: Secondary | ICD-10-CM

## 2013-10-08 NOTE — Progress Notes (Signed)
PCP is [name], MD Referring Provider is Nahser, Wonda Cheng, MD  Chief Complaint  Patient presents with  . Follow-up    2 month f/u post AVR/CABG 07/17/2013    HPI: Final postop office visit after aVR CABG for severe AS and CAD. Patient has underlying COPD which i has slowed recovery somewhat. She is doing well now i with outpatient cardiac rehabilitation--he is about 6 weeks left. Patient is doing well without angina or fluid retention. A postop echocardiogram showed good LV function with normal functioning bioprosthetic aortic valve. She is in sinus rhythm.   Past Medical History  Diagnosis Date  . Hypertension   . Asthma     uses inhaler as needed  . Diabetes mellitus   . GERD (gastroesophageal reflux disease)   . Arthritis   . Anxiety   . Depression   . Hyperlipidemia   . Emphysema of lung   . Glaucoma   . [name]   . Aortic stenosis     Past Surgical History  Procedure Laterality Date  . Ankle surgery  1998    d/t fx.  Left ankle  . Facial cosmetic surgery      face lift  . Nose surgery    . Laparoscopic assisted vaginal hysterectomy  06/21/2011    Procedure: LAPAROSCOPIC ASSISTED VAGINAL HYSTERECTOMY;  Surgeon: Maisie Fus;  Location: [facility];  Service: Gynecology;  Laterality: N/A;  . Salpingoophorectomy  06/21/2011    Procedure: SALPINGO OOPHERECTOMY;  Surgeon: Maisie Fus;  Location: [facility];  Service: Gynecology;  Laterality: Bilateral;  . Anterior and posterior repair  06/21/2011    Procedure: ANTERIOR (CYSTOCELE) AND POSTERIOR REPAIR (RECTOCELE);  Surgeon: Maisie Fus;  Location: [facility];  Service: Gynecology;  Laterality: N/A;  . Vaginal prolapse repair  06/21/2011    Procedure: SACROPEXY;  Surgeon: Maisie Fus;  Location: [facility];  Service: Gynecology;  Laterality: N/A;  sacrospinous ligament suspension  . Cardiac catheterization    . Aortic valve replacement N/A 07/17/2013    Procedure: AORTIC VALVE REPLACEMENT (AVR);  Surgeon:  Ivin Poot, MD;  Location: [facility];  Service: Open Heart Surgery;  Laterality: N/A;  . Coronary artery bypass graft N/A 07/17/2013    Procedure: CORONARY ARTERY BYPASS GRAFT, TIMES ONE, ON PUMP, USING RIGHT INTERNAL MAMMARY ARTERY.;  Surgeon: Ivin Poot, MD;  Location: [facility];  Service: Open Heart Surgery;  Laterality: N/A;  . Intraoperative transesophageal echocardiogram N/A 07/17/2013    Procedure: INTRAOPERATIVE TRANSESOPHAGEAL ECHOCARDIOGRAM;  Surgeon: Ivin Poot, MD;  Location: [facility];  Service: Open Heart Surgery;  Laterality: N/A;  . Abdominal hysterectomy    . Cardiac valve replacement    . Coronary angioplasty      Family History  Problem Relation Age of Onset  . Emphysema Father   . Colon cancer Neg Hx   . Esophageal cancer Neg Hx   . Rectal cancer Neg Hx   . Stomach cancer Neg Hx     Social History History  Substance Use Topics  . Smoking status: Former Smoker -- 2.00 packs/day for 15 years    Types: Cigarettes    Quit date: 09/18/1972  . Smokeless tobacco: Never Used  . Alcohol Use: No    Current Outpatient Prescriptions  Medication Sig Dispense Refill  . acetaminophen (TYLENOL) 325 MG tablet Take 650 mg by mouth as needed for pain.       [name] amLODipine (NORVASC) 5 MG tablet Take 1 tablet (5  mg total) by mouth daily.  90 tablet  1  . aspirin 325 MG EC tablet Take 1 tablet (325 mg total) by mouth daily.  30 tablet  0  . bimatoprost (LUMIGAN) 0.01 % SOLN Place 1 drop into both eyes at bedtime.       . fluticasone (FLOVENT HFA) 44 MCG/ACT inhaler Inhale 2 puffs into the lungs daily.  1 Inhaler  12  . furosemide (LASIX) 20 MG tablet Take 20 mg by mouth every other day.      . Ipratropium-Albuterol (COMBIVENT) 20-100 MCG/ACT AERS respimat Inhale 2 puffs into the lungs 2 (two) times daily.  1 Inhaler  1  . irbesartan (AVAPRO) 150 MG tablet Take 0.5 tablets (75 mg total) by mouth daily.  45 tablet  1  . metoprolol tartrate (LOPRESSOR) 25 MG tablet Take 1 tablet  (25 mg total) by mouth 2 (two) times daily.  180 tablet  1  . Multiple Vitamins-Minerals (OCUVITE-LUTEIN PO) Take 1 tablet by mouth every morning.       . potassium chloride (K-DUR,KLOR-CON) 10 MEQ tablet Take two tablets with the furosemide every other day  90 tablet  1  . pravastatin (PRAVACHOL) 40 MG tablet Take 40 mg by mouth daily.       . sertraline (ZOLOFT) 50 MG tablet Take 1 tablet (50 mg total) by mouth every morning.  30 tablet  1   No current facility-administered medications for this visit.    No Known Allergies  Review of Systems making excellent progress now an outpatient cardiac rehabilitation, no issues with CHF or with surgical incisions  BP 141/79  Pulse 67  Resp 16  Ht 5' (1.524 m)  Wt 113 lb (51.256 kg)  BMI 22.07 kg/m2  SpO2 94% Physical Exam Alert and oriented Heart rate regular Sternal incision well-healed No gallop or murmur No pedal edema  Diagnostic Tests: Chest x-ray clear  Impression: Excellent recovery status post aVR CABG 2 and half months ago. Some dyspnea on exertion secondary to COPD but reconditioning in cardiac rehabilitation has helped significantly  Plan: Return as needed for surgical issues

## 2013-10-10 ENCOUNTER — Encounter (HOSPITAL_COMMUNITY)
Admission: RE | Admit: 2013-10-10 | Discharge: 2013-10-10 | Disposition: A | Discharge status: Home / Self Care | Payer: Medicare Other | Source: Ambulatory Visit | Attending: Cardiology | Admitting: Cardiology

## 2013-10-13 ENCOUNTER — Encounter (HOSPITAL_COMMUNITY)
Admission: RE | Admit: 2013-10-13 | Discharge: 2013-10-13 | Disposition: A | Discharge status: Home / Self Care | Payer: Medicare Other | Source: Ambulatory Visit | Attending: Cardiology | Admitting: Cardiology

## 2013-10-15 ENCOUNTER — Encounter (HOSPITAL_COMMUNITY)
Admission: RE | Admit: 2013-10-15 | Discharge: 2013-10-15 | Disposition: A | Discharge status: Home / Self Care | Payer: Medicare Other | Source: Ambulatory Visit | Attending: Cardiology | Admitting: Cardiology

## 2013-10-17 ENCOUNTER — Encounter (HOSPITAL_COMMUNITY)
Admission: RE | Admit: 2013-10-17 | Discharge: 2013-10-17 | Disposition: A | Discharge status: Home / Self Care | Payer: Medicare Other | Source: Ambulatory Visit | Attending: Cardiology | Admitting: Cardiology

## 2013-10-18 ENCOUNTER — Encounter: Payer: Self-pay | Admitting: Cardiology

## 2013-10-18 DIAGNOSIS — R011 Cardiac murmur, unspecified: Secondary | ICD-10-CM | POA: Insufficient documentation

## 2013-10-18 DIAGNOSIS — N200 Calculus of kidney: Secondary | ICD-10-CM | POA: Insufficient documentation

## 2013-10-20 ENCOUNTER — Encounter (HOSPITAL_COMMUNITY)
Admission: RE | Admit: 2013-10-20 | Discharge: 2013-10-20 | Disposition: A | Discharge status: Home / Self Care | Payer: Medicare Other | Source: Ambulatory Visit | Attending: Cardiology | Admitting: Cardiology

## 2013-10-20 DIAGNOSIS — Z836 Family history of other diseases of the respiratory system: Secondary | ICD-10-CM | POA: Insufficient documentation

## 2013-10-20 DIAGNOSIS — J438 Other emphysema: Secondary | ICD-10-CM | POA: Insufficient documentation

## 2013-10-20 DIAGNOSIS — I509 Heart failure, unspecified: Secondary | ICD-10-CM | POA: Insufficient documentation

## 2013-10-20 DIAGNOSIS — Z5189 Encounter for other specified aftercare: Secondary | ICD-10-CM | POA: Insufficient documentation

## 2013-10-20 DIAGNOSIS — I700 Atherosclerosis of aorta: Secondary | ICD-10-CM | POA: Insufficient documentation

## 2013-10-20 DIAGNOSIS — Z952 Presence of prosthetic heart valve: Secondary | ICD-10-CM | POA: Insufficient documentation

## 2013-10-20 DIAGNOSIS — I2510 Atherosclerotic heart disease of native coronary artery without angina pectoris: Secondary | ICD-10-CM | POA: Insufficient documentation

## 2013-10-20 DIAGNOSIS — I359 Nonrheumatic aortic valve disorder, unspecified: Secondary | ICD-10-CM | POA: Insufficient documentation

## 2013-10-20 DIAGNOSIS — I10 Essential (primary) hypertension: Secondary | ICD-10-CM | POA: Insufficient documentation

## 2013-10-20 DIAGNOSIS — E785 Hyperlipidemia, unspecified: Secondary | ICD-10-CM | POA: Insufficient documentation

## 2013-10-20 DIAGNOSIS — Z79899 Other long term (current) drug therapy: Secondary | ICD-10-CM | POA: Insufficient documentation

## 2013-10-20 DIAGNOSIS — Z951 Presence of aortocoronary bypass graft: Secondary | ICD-10-CM | POA: Insufficient documentation

## 2013-10-22 ENCOUNTER — Encounter (HOSPITAL_COMMUNITY)
Admission: RE | Admit: 2013-10-22 | Discharge: 2013-10-22 | Disposition: A | Discharge status: Home / Self Care | Payer: Medicare Other | Source: Ambulatory Visit | Attending: Cardiology | Admitting: Cardiology

## 2013-10-24 ENCOUNTER — Encounter (HOSPITAL_COMMUNITY)
Admission: RE | Admit: 2013-10-24 | Discharge: 2013-10-24 | Disposition: A | Discharge status: Home / Self Care | Payer: Medicare Other | Source: Ambulatory Visit | Attending: Cardiology | Admitting: Cardiology

## 2013-10-27 ENCOUNTER — Encounter (HOSPITAL_COMMUNITY)
Admission: RE | Admit: 2013-10-27 | Discharge: 2013-10-27 | Disposition: A | Discharge status: Home / Self Care | Payer: Medicare Other | Source: Ambulatory Visit | Attending: Cardiology | Admitting: Cardiology

## 2013-10-29 ENCOUNTER — Encounter (HOSPITAL_COMMUNITY)
Admission: RE | Admit: 2013-10-29 | Discharge: 2013-10-29 | Disposition: A | Discharge status: Home / Self Care | Payer: Medicare Other | Source: Ambulatory Visit | Attending: Cardiology | Admitting: Cardiology

## 2013-10-31 ENCOUNTER — Encounter (HOSPITAL_COMMUNITY)
Admission: RE | Admit: 2013-10-31 | Discharge: 2013-10-31 | Disposition: A | Discharge status: Home / Self Care | Payer: Medicare Other | Source: Ambulatory Visit | Attending: Cardiology | Admitting: Cardiology

## 2013-11-03 ENCOUNTER — Encounter (HOSPITAL_COMMUNITY)
Admission: RE | Admit: 2013-11-03 | Discharge: 2013-11-03 | Disposition: A | Discharge status: Home / Self Care | Payer: Medicare Other | Source: Ambulatory Visit | Attending: Cardiology | Admitting: Cardiology

## 2013-11-05 ENCOUNTER — Encounter (HOSPITAL_COMMUNITY)
Admission: RE | Admit: 2013-11-05 | Discharge: 2013-11-05 | Disposition: A | Discharge status: Home / Self Care | Payer: Medicare Other | Source: Ambulatory Visit | Attending: Cardiology | Admitting: Cardiology

## 2013-11-07 ENCOUNTER — Encounter (HOSPITAL_COMMUNITY)
Admission: RE | Admit: 2013-11-07 | Discharge: 2013-11-07 | Disposition: A | Discharge status: Home / Self Care | Payer: Medicare Other | Source: Ambulatory Visit | Attending: Cardiology | Admitting: Cardiology

## 2013-11-10 ENCOUNTER — Encounter (HOSPITAL_COMMUNITY)
Admission: RE | Admit: 2013-11-10 | Discharge: 2013-11-10 | Disposition: A | Discharge status: Home / Self Care | Payer: Medicare Other | Source: Ambulatory Visit | Attending: Cardiology | Admitting: Cardiology

## 2013-11-12 ENCOUNTER — Encounter (HOSPITAL_COMMUNITY)
Admission: RE | Admit: 2013-11-12 | Discharge: 2013-11-12 | Disposition: A | Discharge status: Home / Self Care | Payer: Medicare Other | Source: Ambulatory Visit | Attending: Cardiology | Admitting: Cardiology

## 2013-11-14 ENCOUNTER — Encounter (HOSPITAL_COMMUNITY)
Admission: RE | Admit: 2013-11-14 | Discharge: 2013-11-14 | Disposition: A | Discharge status: Home / Self Care | Payer: Medicare Other | Source: Ambulatory Visit | Attending: Cardiology | Admitting: Cardiology

## 2013-11-17 ENCOUNTER — Encounter (HOSPITAL_COMMUNITY)
Admission: RE | Admit: 2013-11-17 | Discharge: 2013-11-17 | Disposition: A | Discharge status: Home / Self Care | Payer: Medicare Other | Source: Ambulatory Visit | Attending: Cardiology | Admitting: Cardiology

## 2013-11-17 DIAGNOSIS — I10 Essential (primary) hypertension: Secondary | ICD-10-CM | POA: Insufficient documentation

## 2013-11-17 DIAGNOSIS — J438 Other emphysema: Secondary | ICD-10-CM | POA: Insufficient documentation

## 2013-11-17 DIAGNOSIS — Z952 Presence of prosthetic heart valve: Secondary | ICD-10-CM | POA: Insufficient documentation

## 2013-11-17 DIAGNOSIS — Z79899 Other long term (current) drug therapy: Secondary | ICD-10-CM | POA: Insufficient documentation

## 2013-11-17 DIAGNOSIS — I509 Heart failure, unspecified: Secondary | ICD-10-CM | POA: Insufficient documentation

## 2013-11-17 DIAGNOSIS — Z836 Family history of other diseases of the respiratory system: Secondary | ICD-10-CM | POA: Insufficient documentation

## 2013-11-17 DIAGNOSIS — I700 Atherosclerosis of aorta: Secondary | ICD-10-CM | POA: Insufficient documentation

## 2013-11-17 DIAGNOSIS — I2510 Atherosclerotic heart disease of native coronary artery without angina pectoris: Secondary | ICD-10-CM | POA: Insufficient documentation

## 2013-11-17 DIAGNOSIS — Z951 Presence of aortocoronary bypass graft: Secondary | ICD-10-CM | POA: Insufficient documentation

## 2013-11-17 DIAGNOSIS — I359 Nonrheumatic aortic valve disorder, unspecified: Secondary | ICD-10-CM | POA: Insufficient documentation

## 2013-11-17 DIAGNOSIS — Z5189 Encounter for other specified aftercare: Secondary | ICD-10-CM | POA: Insufficient documentation

## 2013-11-17 DIAGNOSIS — E785 Hyperlipidemia, unspecified: Secondary | ICD-10-CM | POA: Insufficient documentation

## 2013-11-19 ENCOUNTER — Encounter (HOSPITAL_COMMUNITY)
Admission: RE | Admit: 2013-11-19 | Discharge: 2013-11-19 | Disposition: A | Discharge status: Home / Self Care | Payer: Medicare Other | Source: Ambulatory Visit | Attending: Cardiology | Admitting: Cardiology

## 2013-11-21 ENCOUNTER — Encounter (HOSPITAL_COMMUNITY)
Admission: RE | Admit: 2013-11-21 | Discharge: 2013-11-21 | Disposition: A | Discharge status: Home / Self Care | Payer: Medicare Other | Source: Ambulatory Visit | Attending: Cardiology | Admitting: Cardiology

## 2013-11-24 ENCOUNTER — Encounter (HOSPITAL_COMMUNITY)
Admission: RE | Admit: 2013-11-24 | Discharge: 2013-11-24 | Disposition: A | Discharge status: Home / Self Care | Payer: Medicare Other | Source: Ambulatory Visit | Attending: Cardiology | Admitting: Cardiology

## 2013-11-26 ENCOUNTER — Encounter (HOSPITAL_COMMUNITY)
Admission: RE | Admit: 2013-11-26 | Discharge: 2013-11-26 | Disposition: A | Discharge status: Home / Self Care | Payer: Medicare Other | Source: Ambulatory Visit | Attending: Cardiology | Admitting: Cardiology

## 2013-11-28 ENCOUNTER — Encounter (HOSPITAL_COMMUNITY)
Admission: RE | Admit: 2013-11-28 | Discharge: 2013-11-28 | Disposition: A | Discharge status: Home / Self Care | Payer: Medicare Other | Source: Ambulatory Visit | Attending: Cardiology | Admitting: Cardiology

## 2013-12-01 ENCOUNTER — Encounter (HOSPITAL_COMMUNITY)
Admission: RE | Admit: 2013-12-01 | Discharge: 2013-12-01 | Disposition: A | Discharge status: Home / Self Care | Payer: Medicare Other | Source: Ambulatory Visit | Attending: Cardiology | Admitting: Cardiology

## 2013-12-01 ENCOUNTER — Encounter (HOSPITAL_COMMUNITY): Payer: Self-pay

## 2013-12-01 NOTE — Progress Notes (Signed)
[name] graduated today and plans to continue exercise on his own.

## 2013-12-03 ENCOUNTER — Encounter (HOSPITAL_COMMUNITY): Payer: Medicare Other

## 2013-12-05 ENCOUNTER — Encounter (HOSPITAL_COMMUNITY): Payer: Medicare Other

## 2013-12-08 ENCOUNTER — Encounter (HOSPITAL_COMMUNITY): Payer: Medicare Other

## 2013-12-10 ENCOUNTER — Encounter (HOSPITAL_COMMUNITY): Payer: Medicare Other

## 2013-12-12 ENCOUNTER — Encounter (HOSPITAL_COMMUNITY): Payer: Medicare Other

## 2013-12-25 ENCOUNTER — Telehealth: Payer: Self-pay | Admitting: Cardiology

## 2013-12-25 NOTE — Telephone Encounter (Signed)
Pt is inquiring about the cardiac maintenance program, pt finished 2-3 weeks ago and feels much better when she is going. Pt was told to call cardiac rehab so process could be started, she had questions regarding her insurance coverage and they may be able to direct her. Pt was given number.

## 2013-12-25 NOTE — Telephone Encounter (Signed)
New Message:  Pt state she believes she still needs some cardiac rehab or cardiac maintenance.. Pt would like to speak to the nurse about this.[name]

## 2014-04-09 ENCOUNTER — Encounter: Payer: Self-pay | Admitting: Cardiology

## 2014-04-09 ENCOUNTER — Ambulatory Visit (INDEPENDENT_AMBULATORY_CARE_PROVIDER_SITE_OTHER): Payer: Medicare Other | Admitting: Cardiology

## 2014-04-09 VITALS — BP 156/78 | HR 46 | Ht 60.0 in | Wt 111.4 lb

## 2014-04-09 DIAGNOSIS — Z951 Presence of aortocoronary bypass graft: Secondary | ICD-10-CM

## 2014-04-09 DIAGNOSIS — E785 Hyperlipidemia, unspecified: Secondary | ICD-10-CM

## 2014-04-09 DIAGNOSIS — I359 Nonrheumatic aortic valve disorder, unspecified: Secondary | ICD-10-CM

## 2014-04-09 DIAGNOSIS — I10 Essential (primary) hypertension: Secondary | ICD-10-CM

## 2014-04-09 DIAGNOSIS — Z952 Presence of prosthetic heart valve: Secondary | ICD-10-CM

## 2014-04-09 DIAGNOSIS — Z954 Presence of other heart-valve replacement: Secondary | ICD-10-CM

## 2014-04-09 DIAGNOSIS — I350 Nonrheumatic aortic (valve) stenosis: Secondary | ICD-10-CM

## 2014-04-09 MED ORDER — AMLODIPINE BESYLATE 10 MG PO TABS: 10.0000 mg | ORAL_TABLET | Freq: Every day | ORAL | Status: DC

## 2014-04-09 MED ORDER — IRBESARTAN 150 MG PO TABS: 150.0000 mg | ORAL_TABLET | Freq: Every day | ORAL | Status: DC

## 2014-04-09 NOTE — Assessment & Plan Note (Signed)
Continue statin. 

## 2014-04-09 NOTE — Patient Instructions (Addendum)
Your physician wants you to follow-up in: [place] will receive a reminder letter in the mail two months in advance. If you don't receive a letter, please call our office to schedule the follow-up appointment.   STOP METOPROLOL  INCREASE IRBESARTAN TO 150 MG ONCE DAILY  INCREASE AMLODIPINE TO 10 MG ONCE DAILY  Your physician recommends that you return for lab work in [place]

## 2014-04-09 NOTE — Progress Notes (Signed)
HPI: FU aortic stenosis and CAD. I initially saw her in September of 2014. Echocardiogram was performed at that time and showed normal LV function, moderate left ventricular hypertrophy, grade 1 diastolic dysfunction, severe aortic stenosis with a mean gradient of 46 mm of mercury. Nuclear study in October of 2014 showed an ejection fraction of 79% and normal perfusion. Patient underwent cardiac catheterization in October of 2014. She was found to have a 70% LAD and there was a 40-50% mid RCA. Preoperative carotid Dopplers in October 2014 showed 1-39% bilateral stenosis. In October of 2014 the patient underwent aortic valve replacement with a pericardial valve and coronary artery bypassing graft with a LIMA to the LAD. Echocardiogram in December of 2014 showed normal LV function, grade 1 diastolic dysfunction, bioprosthetic aortic valve with a mean gradient of 19 mm of mercury, mild left atrial enlargement. Since I last saw her, She has mild dyspnea on exertion she attributes to COPD. No orthopnea, PND, pedal edema, syncope or chest pain. She has noticed fatigue and a slow heart rate.    Current Outpatient Prescriptions  Medication Sig Dispense Refill  . acetaminophen (TYLENOL) 325 MG tablet Take 650 mg by mouth as needed for pain.       [name] amLODipine (NORVASC) 5 MG tablet Take 1 tablet (5 mg total) by mouth daily.  90 tablet  1  . aspirin 325 MG EC tablet Take 1 tablet (325 mg total) by mouth daily.  30 tablet  0  . bimatoprost (LUMIGAN) 0.01 % SOLN Place 1 drop into both eyes at bedtime.       . fluticasone (FLOVENT HFA) 44 MCG/ACT inhaler Inhale 1 puff into the lungs 2 (two) times daily.      . furosemide (LASIX) 20 MG tablet Take 20 mg by mouth every other day.      . irbesartan (AVAPRO) 150 MG tablet Take 0.5 tablets (75 mg total) by mouth daily.  45 tablet  1  . Melatonin-Pyridoxine (MELATONEX PO) Take 1 tablet by mouth as needed.      . metoprolol tartrate (LOPRESSOR) 25 MG tablet Take 1  tablet (25 mg total) by mouth 2 (two) times daily.  180 tablet  1  . Multiple Vitamins-Minerals (OCUVITE-LUTEIN PO) Take 1 tablet by mouth every morning.       . potassium chloride (K-DUR,KLOR-CON) 10 MEQ tablet Take two tablets with the furosemide every other day  90 tablet  1  . pravastatin (PRAVACHOL) 40 MG tablet Take 40 mg by mouth daily.       [name] PROAIR HFA 108 (90 BASE) MCG/ACT inhaler Inhale 2 puffs into the lungs 4 (four) times daily as needed.      . sertraline (ZOLOFT) 50 MG tablet Take 1 tablet (50 mg total) by mouth every morning.  30 tablet  1  . traZODone (DESYREL) 50 MG tablet Take 25 mg by mouth at bedtime.       No current facility-administered medications for this visit.     Past Medical History  Diagnosis Date  . Hypertension   . Asthma     uses inhaler as needed  . Diabetes mellitus   . GERD (gastroesophageal reflux disease)   . Arthritis   . Anxiety   . Depression   . Hyperlipidemia   . Emphysema of lung   . Glaucoma   . [name]   . Aortic stenosis   . Murmur   . Nephrolithiasis  Past Surgical History  Procedure Laterality Date  . Ankle surgery  1998    d/t fx.  Left ankle  . Facial cosmetic surgery      face lift  . Nose surgery    . Laparoscopic assisted vaginal hysterectomy  06/21/2011    Procedure: LAPAROSCOPIC ASSISTED VAGINAL HYSTERECTOMY;  Surgeon: Maisie Fus;  Location: [facility];  Service: Gynecology;  Laterality: N/A;  . Salpingoophorectomy  06/21/2011    Procedure: SALPINGO OOPHERECTOMY;  Surgeon: Maisie Fus;  Location: [facility];  Service: Gynecology;  Laterality: Bilateral;  . Anterior and posterior repair  06/21/2011    Procedure: ANTERIOR (CYSTOCELE) AND POSTERIOR REPAIR (RECTOCELE);  Surgeon: Maisie Fus;  Location: [facility];  Service: Gynecology;  Laterality: N/A;  . Vaginal prolapse repair  06/21/2011    Procedure: SACROPEXY;  Surgeon: Maisie Fus;  Location: [facility];  Service: Gynecology;  Laterality: N/A;   sacrospinous ligament suspension  . Cardiac catheterization    . Aortic valve replacement N/A 07/17/2013    Procedure: AORTIC VALVE REPLACEMENT (AVR);  Surgeon: Ivin Poot, MD;  Location: [facility];  Service: Open Heart Surgery;  Laterality: N/A;  . Coronary artery bypass graft N/A 07/17/2013    Procedure: CORONARY ARTERY BYPASS GRAFT, TIMES ONE, ON PUMP, USING RIGHT INTERNAL MAMMARY ARTERY.;  Surgeon: Ivin Poot, MD;  Location: [facility];  Service: Open Heart Surgery;  Laterality: N/A;  . Intraoperative transesophageal echocardiogram N/A 07/17/2013    Procedure: INTRAOPERATIVE TRANSESOPHAGEAL ECHOCARDIOGRAM;  Surgeon: Ivin Poot, MD;  Location: [facility];  Service: Open Heart Surgery;  Laterality: N/A;  . Abdominal hysterectomy    . Cardiac valve replacement    . Coronary angioplasty      History   Social History  . Marital Status: Married    Spouse Name: Jenny Reichmann    Number of Children: 1  . Years of Education: N/A   Occupational History  . retired Network engineer   .     Social History Main Topics  . Smoking status: Former Smoker -- 2.00 packs/day for 15 years    Types: Cigarettes    Quit date: 09/18/1972  . Smokeless tobacco: Never Used  . Alcohol Use: No  . Drug Use: No  . Sexual Activity: Not on file   Other Topics Concern  . Not on file   Social History Narrative  . No narrative on file    ROS: no fevers or chills, productive cough, hemoptysis, dysphasia, odynophagia, melena, hematochezia, dysuria, hematuria, rash, seizure activity, orthopnea, PND, pedal edema, claudication. Remaining systems are negative.  Physical Exam: Well-developed well-nourished in no acute distress.  Skin is warm and dry.  HEENT is normal.  Neck is supple.  Chest Diminished breath sounds throughout Cardiovascular exam is regular rate and rhythm.  Abdominal exam nontender or distended. No masses palpated. Extremities show no edema. neuro grossly intact  ECG Sinus bradycardia at a rate of 46.  Right bundle branch block. Left anterior fascicular block.

## 2014-04-09 NOTE — Assessment & Plan Note (Signed)
Continue SBE prophylaxis. 

## 2014-04-09 NOTE — Assessment & Plan Note (Signed)
Continue aspirin and statin. 

## 2014-04-09 NOTE — Assessment & Plan Note (Signed)
Patient is bradycardic and blood pressure is elevated. She has some fatigue. Discontinue metoprolol. Increase Norvasc to 10 mg daily. Increase Avapro to 150 mg daily. Check potassium and renal function in one week. Follow Blood pressure.

## 2014-04-16 LAB — BASIC METABOLIC PANEL WITH GFR
BUN: 33 mg/dL — AB (ref 6–23)
CALCIUM: 9.5 mg/dL (ref 8.4–10.5)
CO2: 19 mEq/L (ref 19–32)
Chloride: 107 mEq/L (ref 96–112)
Creat: 1.33 mg/dL — ABNORMAL HIGH (ref 0.50–1.10)
GFR, EST AFRICAN AMERICAN: 44 mL/min — AB
GFR, Est Non African American: 38 mL/min — ABNORMAL LOW
GLUCOSE: 120 mg/dL — AB (ref 70–99)
Potassium: 5.2 mEq/L (ref 3.5–5.3)
Sodium: 138 mEq/L (ref 135–145)

## 2014-04-17 ENCOUNTER — Other Ambulatory Visit: Payer: Self-pay | Admitting: *Deleted

## 2014-04-17 DIAGNOSIS — N289 Disorder of kidney and ureter, unspecified: Secondary | ICD-10-CM

## 2014-04-30 LAB — BASIC METABOLIC PANEL WITH GFR
BUN: 27 mg/dL — ABNORMAL HIGH (ref 6–23)
CO2: 20 mEq/L (ref 19–32)
CREATININE: 1.18 mg/dL — AB (ref 0.50–1.10)
Calcium: 9.5 mg/dL (ref 8.4–10.5)
Chloride: 106 mEq/L (ref 96–112)
GFR, EST AFRICAN AMERICAN: 51 mL/min — AB
GFR, EST NON AFRICAN AMERICAN: 44 mL/min — AB
Glucose, Bld: 99 mg/dL (ref 70–99)
Potassium: 4.6 mEq/L (ref 3.5–5.3)
Sodium: 139 mEq/L (ref 135–145)

## 2014-07-20 ENCOUNTER — Ambulatory Visit
Admission: RE | Admit: 2014-07-20 | Discharge: 2014-07-20 | Disposition: A | Discharge status: Home / Self Care | Payer: Medicare Other | Source: Ambulatory Visit | Attending: Family Medicine | Admitting: Family Medicine

## 2014-07-20 ENCOUNTER — Other Ambulatory Visit: Payer: Self-pay | Admitting: Family Medicine

## 2014-07-20 DIAGNOSIS — J441 Chronic obstructive pulmonary disease with (acute) exacerbation: Secondary | ICD-10-CM

## 2014-08-27 ENCOUNTER — Encounter (HOSPITAL_COMMUNITY): Payer: Self-pay | Admitting: Cardiovascular Disease

## 2014-10-12 ENCOUNTER — Encounter: Payer: Self-pay | Admitting: Cardiology

## 2014-10-12 ENCOUNTER — Ambulatory Visit (INDEPENDENT_AMBULATORY_CARE_PROVIDER_SITE_OTHER): Payer: Medicare Other | Admitting: Cardiology

## 2014-10-12 ENCOUNTER — Ambulatory Visit: Payer: Medicare Other | Admitting: Cardiology

## 2014-10-12 VITALS — BP 140/70 | HR 77 | Ht 60.0 in | Wt 118.0 lb

## 2014-10-12 DIAGNOSIS — I10 Essential (primary) hypertension: Secondary | ICD-10-CM

## 2014-10-12 DIAGNOSIS — Z954 Presence of other heart-valve replacement: Secondary | ICD-10-CM

## 2014-10-12 DIAGNOSIS — Z951 Presence of aortocoronary bypass graft: Secondary | ICD-10-CM

## 2014-10-12 DIAGNOSIS — I350 Nonrheumatic aortic (valve) stenosis: Secondary | ICD-10-CM

## 2014-10-12 DIAGNOSIS — Z952 Presence of prosthetic heart valve: Secondary | ICD-10-CM

## 2014-10-12 DIAGNOSIS — E785 Hyperlipidemia, unspecified: Secondary | ICD-10-CM

## 2014-10-12 MED ORDER — FUROSEMIDE 20 MG PO TABS: 20.0000 mg | ORAL_TABLET | ORAL | Status: DC

## 2014-10-12 MED ORDER — IRBESARTAN 150 MG PO TABS: 150.0000 mg | ORAL_TABLET | Freq: Every day | ORAL | Status: DC

## 2014-10-12 MED ORDER — AMLODIPINE BESYLATE 10 MG PO TABS: 10.0000 mg | ORAL_TABLET | Freq: Every day | ORAL | Status: DC

## 2014-10-12 NOTE — Assessment & Plan Note (Signed)
Blood pressure controlled. Continue present medications. 

## 2014-10-12 NOTE — Assessment & Plan Note (Signed)
Continue aspirin and statin. 

## 2014-10-12 NOTE — Assessment & Plan Note (Signed)
Continue SBE prophylaxis. 

## 2014-10-12 NOTE — Progress Notes (Signed)
HPI: FU aortic stenosis/s/p AVR and CAD. Patient underwent cardiac catheterization in October of 2014. She was found to have a 70% LAD and there was a 40-50% mid RCA. Preoperative carotid Dopplers in October 2014 showed 1-39% bilateral stenosis. In October of 2014 the patient underwent aortic valve replacement with a pericardial valve and coronary artery bypassing graft with a LIMA to the LAD. Echocardiogram in December of 2014 showed normal LV function, grade 1 diastolic dysfunction, bioprosthetic aortic valve with a mean gradient of 19 mm of mercury, mild left atrial enlargement. Since I last saw her, the patient has dyspnea with more extreme activities but not with routine activities. It is relieved with rest. It is not associated with chest pain. There is no orthopnea, PND or pedal edema. There is no syncope or palpitations. There is no exertional chest pain.   Current Outpatient Prescriptions  Medication Sig Dispense Refill  . acetaminophen (TYLENOL) 325 MG tablet Take 650 mg by mouth as needed for pain.     [name] amLODipine (NORVASC) 10 MG tablet Take 1 tablet (10 mg total) by mouth daily. 90 tablet 3  . aspirin 325 MG EC tablet Take 1 tablet (325 mg total) by mouth daily. 30 tablet 0  . bimatoprost (LUMIGAN) 0.01 % SOLN Place 1 drop into both eyes at bedtime.     . fluticasone (FLOVENT HFA) 44 MCG/ACT inhaler Inhale 1 puff into the lungs 2 (two) times daily.    . furosemide (LASIX) 20 MG tablet Take 20 mg by mouth every other day.    . irbesartan (AVAPRO) 150 MG tablet Take 1 tablet (150 mg total) by mouth daily. 90 tablet 3  . Melatonin-Pyridoxine (MELATONEX PO) Take 1 tablet by mouth as needed.    . Multiple Vitamins-Minerals (OCUVITE-LUTEIN PO) Take 1 tablet by mouth every morning.     . pravastatin (PRAVACHOL) 40 MG tablet Take 40 mg by mouth daily.     [name] PROAIR HFA 108 (90 BASE) MCG/ACT inhaler Inhale 2 puffs into the lungs 4 (four) times daily as needed.    . sertraline (ZOLOFT) 50  MG tablet Take 1 tablet (50 mg total) by mouth every morning. 30 tablet 1   No current facility-administered medications for this visit.     Past Medical History  Diagnosis Date  . Hypertension   . Asthma     uses inhaler as needed  . Diabetes mellitus   . GERD (gastroesophageal reflux disease)   . Arthritis   . Anxiety   . Depression   . Hyperlipidemia   . Emphysema of lung   . Glaucoma   . [name]   . Aortic stenosis   . Murmur   . Nephrolithiasis     Past Surgical History  Procedure Laterality Date  . Ankle surgery  1998    d/t fx.  Left ankle  . Facial cosmetic surgery      face lift  . Nose surgery    . Laparoscopic assisted vaginal hysterectomy  06/21/2011    Procedure: LAPAROSCOPIC ASSISTED VAGINAL HYSTERECTOMY;  Surgeon: Maisie Fus;  Location: [facility];  Service: Gynecology;  Laterality: N/A;  . Salpingoophorectomy  06/21/2011    Procedure: SALPINGO OOPHERECTOMY;  Surgeon: Maisie Fus;  Location: [facility];  Service: Gynecology;  Laterality: Bilateral;  . Anterior and posterior repair  06/21/2011    Procedure: ANTERIOR (CYSTOCELE) AND POSTERIOR REPAIR (RECTOCELE);  Surgeon: Maisie Fus;  Location: [facility];  Service: Gynecology;  Laterality: N/A;  . Vaginal prolapse repair  06/21/2011    Procedure: SACROPEXY;  Surgeon: Maisie Fus;  Location: [facility];  Service: Gynecology;  Laterality: N/A;  sacrospinous ligament suspension  . Cardiac catheterization    . Aortic valve replacement N/A 07/17/2013    Procedure: AORTIC VALVE REPLACEMENT (AVR);  Surgeon: Ivin Poot, MD;  Location: [facility];  Service: Open Heart Surgery;  Laterality: N/A;  . Coronary artery bypass graft N/A 07/17/2013    Procedure: CORONARY ARTERY BYPASS GRAFT, TIMES ONE, ON PUMP, USING RIGHT INTERNAL MAMMARY ARTERY.;  Surgeon: Ivin Poot, MD;  Location: [facility];  Service: Open Heart Surgery;  Laterality: N/A;  . Intraoperative transesophageal echocardiogram N/A 07/17/2013     Procedure: INTRAOPERATIVE TRANSESOPHAGEAL ECHOCARDIOGRAM;  Surgeon: Ivin Poot, MD;  Location: [facility];  Service: Open Heart Surgery;  Laterality: N/A;  . Abdominal hysterectomy    . Cardiac valve replacement    . Coronary angioplasty    . Left and right heart catheterization with coronary angiogram N/A 06/27/2013    Procedure: LEFT AND RIGHT HEART CATHETERIZATION WITH CORONARY ANGIOGRAM;  Surgeon: Ramond Dial, MD;  Location: [facility] C[facility];  Service: Cardiovascular;  Laterality: N/A;    History   Social History  . Marital Status: Married    Spouse Name: Jenny Reichmann    Number of Children: 1  . Years of Education: N/A   Occupational History  . retired Network engineer   .     Social History Main Topics  . Smoking status: Former Smoker -- 2.00 packs/day for 15 years    Types: Cigarettes    Quit date: 09/18/1972  . Smokeless tobacco: Never Used  . Alcohol Use: No  . Drug Use: No  . Sexual Activity: Not on file   Other Topics Concern  . Not on file   Social History Narrative    ROS: no fevers or chills, productive cough, hemoptysis, dysphasia, odynophagia, melena, hematochezia, dysuria, hematuria, rash, seizure activity, orthopnea, PND, pedal edema, claudication. Remaining systems are negative.  Physical Exam: Well-developed well-nourished in no acute distress.  Skin is warm and dry.  HEENT is normal.  Neck is supple.  Chest is clear to auscultation with normal expansion.  Cardiovascular exam is regular rate and rhythm. 2/6 systolic murmur left sternal border. No diastolic murmur. Abdominal exam nontender or distended. No masses palpated. Extremities show no edema. neuro grossly intact  ECG normal sinus rhythm, left anterior fascicular block, right bundle branch block, cannot rule out anterior infarct.

## 2014-10-12 NOTE — Patient Instructions (Signed)
Your physician wants you to follow-up in: ONE YEAR WITH DR CRENSHAW You will receive a reminder letter in the mail two months in advance. If you don't receive a letter, please call our office to schedule the follow-up appointment.  

## 2014-10-12 NOTE — Assessment & Plan Note (Signed)
Continue statin. Lipids and liver monitored by primary care. 

## 2014-10-16 IMAGING — CR DG CHEST 1V PORT
1 series · 1 of 1 positions shown · non-contrast
Comparison: Portable examinations yesterday dating back to
07/17/2013.

CLINICAL DATA: Subsequent evaluation of left lower lobe atelectasis
after CABG and aortic valve replacement on 07/17/2013.

EXAM:
PORTABLE CHEST - 1 VIEW

[AP]
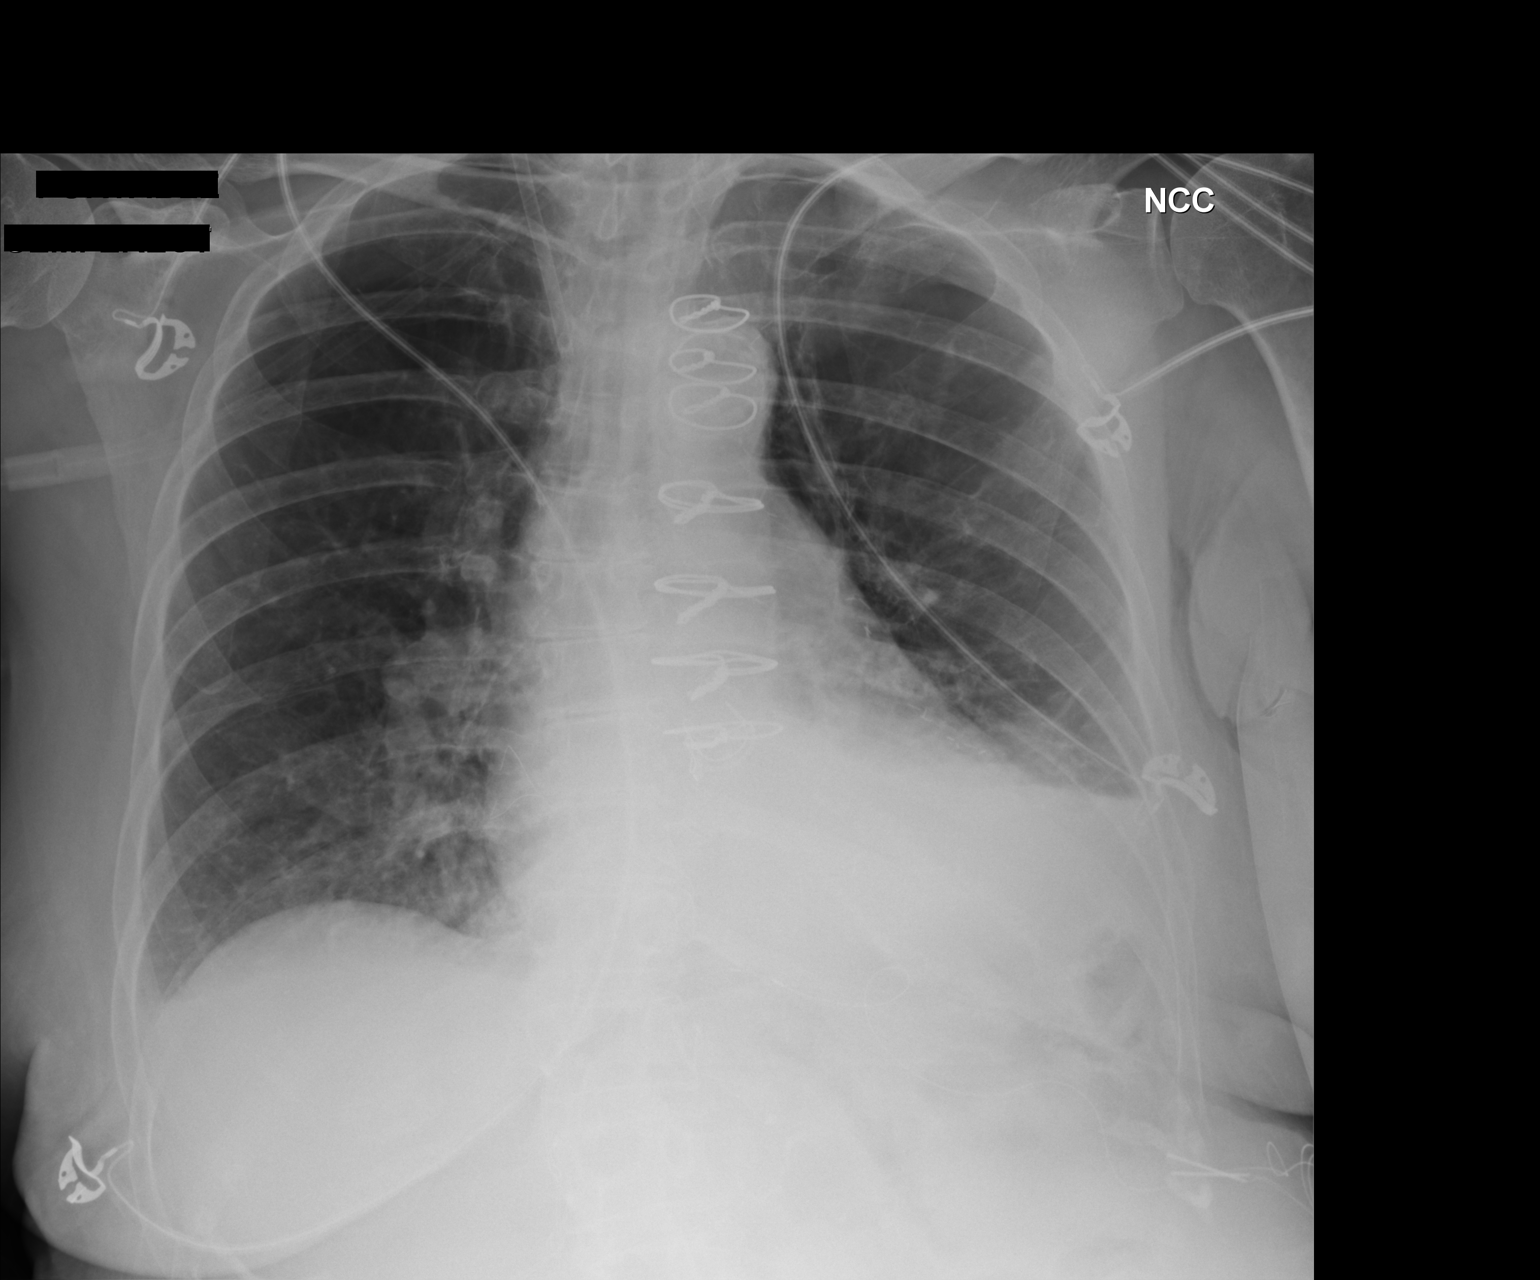

[1 of 1 positions shown; findings below may reference images not displayed]

FINDINGS: Sternotomy for CABG and aortic valve replacement. Cardiac silhouette
mildly enlarged for technique, unchanged. Pulmonary vascularity
normal. Thoracic aorta atherosclerotic, unchanged. Hilar and
mediastinal contours otherwise unremarkable. Dense atelectasis in
the left lower lobe, unchanged, with associated left pleural
effusion. Interval left chest tube removal with no visible
pneumothorax. Right lung remains clear apart from minimal
atelectasis at the base. No new pulmonary parenchymal abnormalities.
Right jugular introducer sheath tip overlies the upper SVC,
unchanged.
IMPRESSION: 1. No pneumothorax after left chest tube removal.
2. Stable dense left lower lobe atelectasis and probable left
pleural effusion. Stable mild right basilar atelectasis.
3. No new abnormalities.

## 2014-10-18 IMAGING — CR DG CHEST 2V
2 series · 2 of 2 positions shown · non-contrast
Comparison: 07/21/2013

CLINICAL DATA: CABG

EXAM:
CHEST  2 VIEW

[x chest ap]
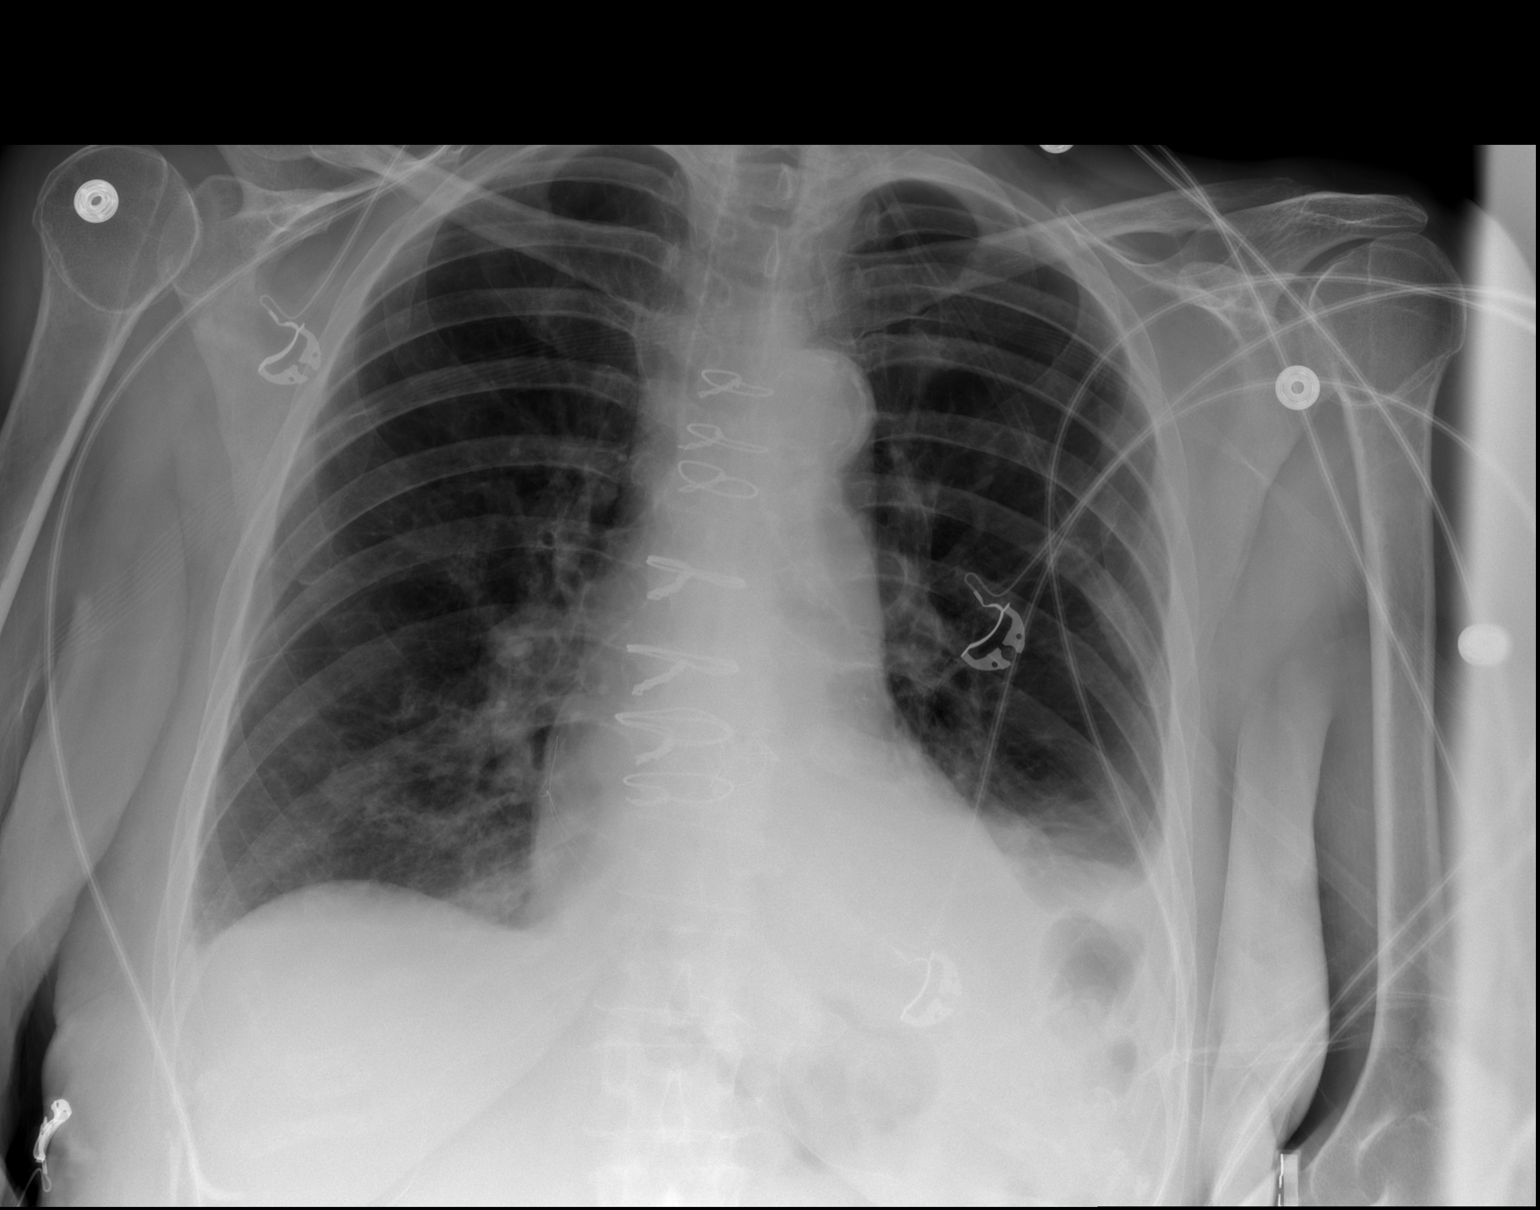

[w chest lat]
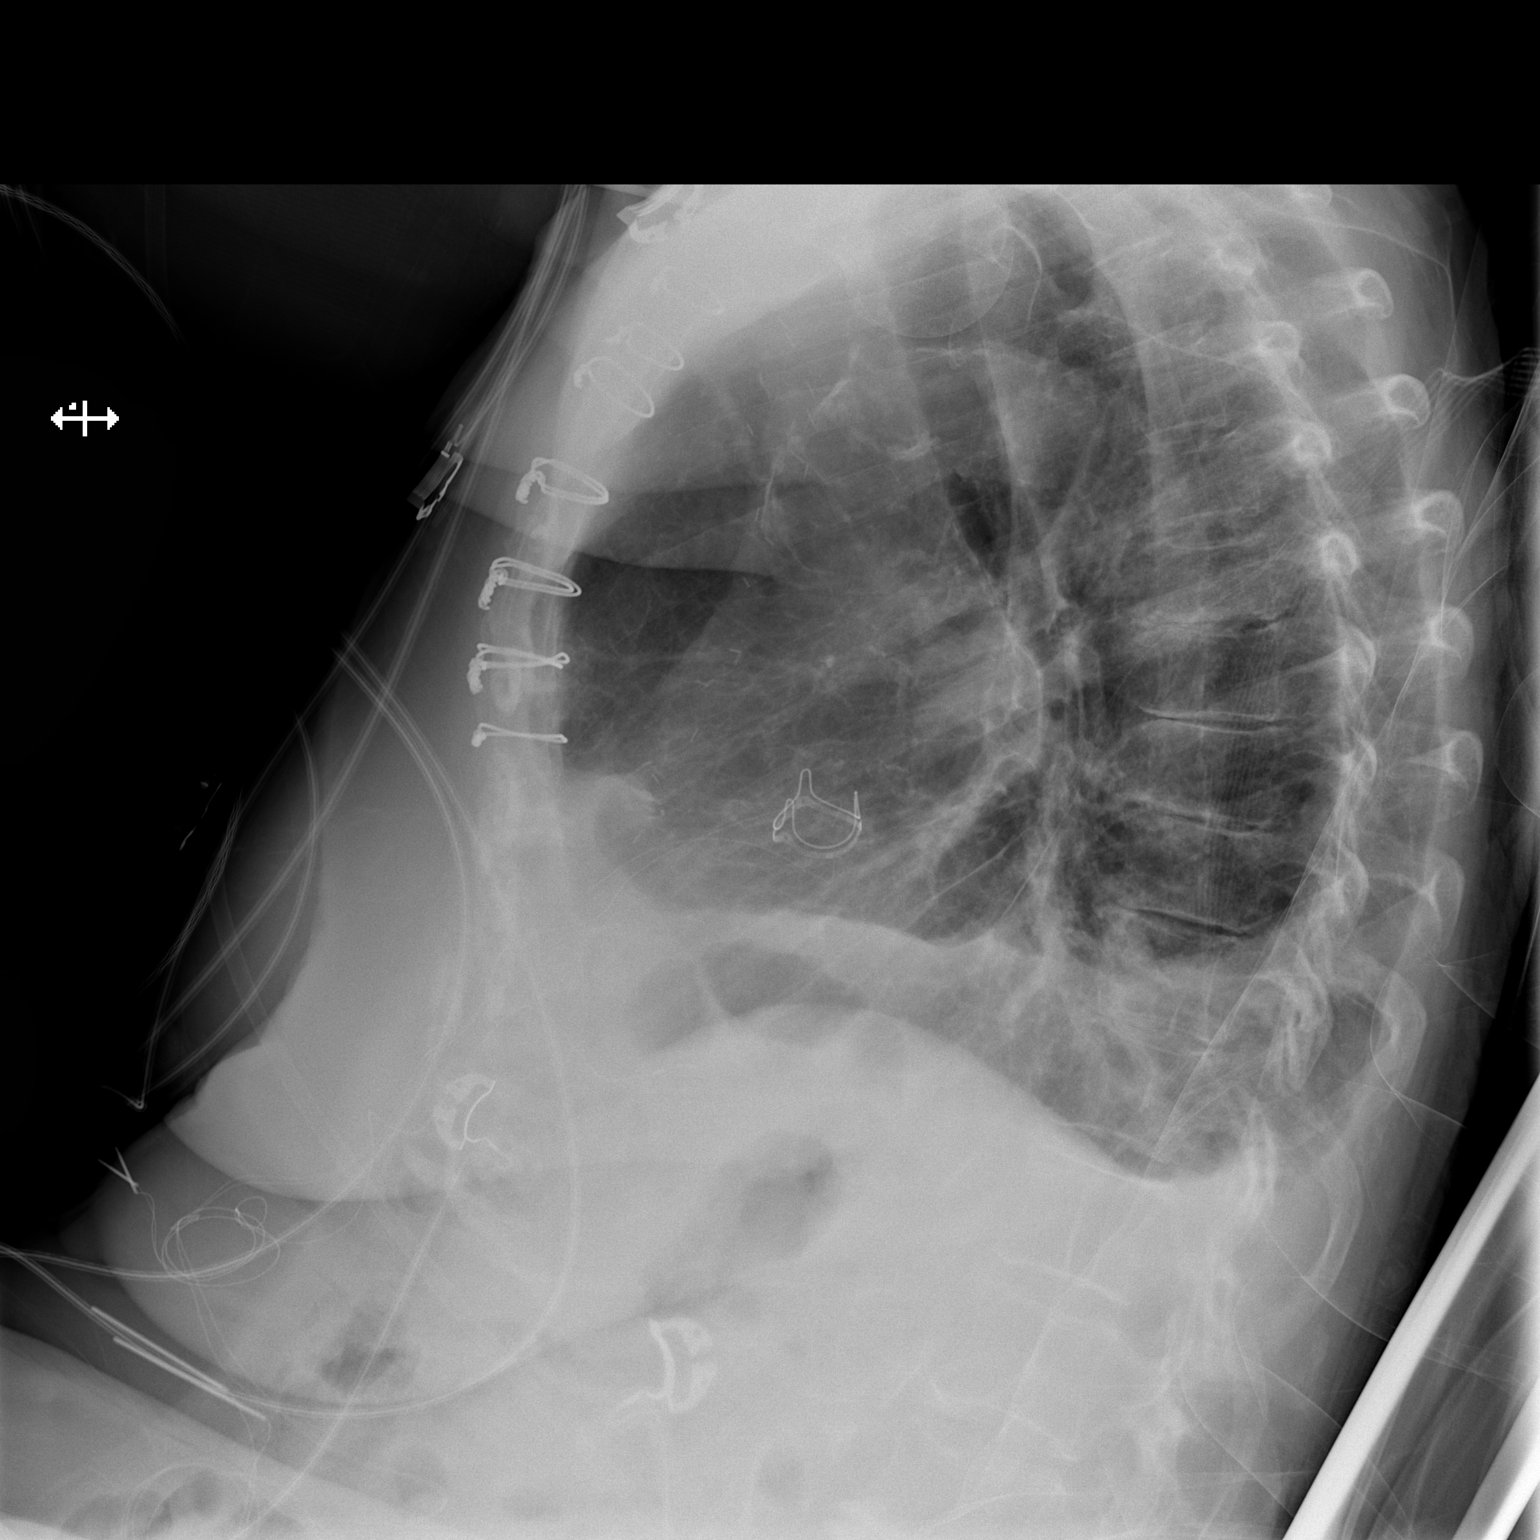

[2 of 2 positions shown; findings below may reference images not displayed]

FINDINGS: Mediastinum and hilar structures are normal. Prior CABG. Prior
aortic valve replacement. Heart size normal. Pulmonary vascularity
normal. Left lower lobe mild infiltrate versus atelectasis with
small pleural effusion noted. The pleural effusion has improved
slightly from prior study.
IMPRESSION: 1. Prior CABG and aortic valve replacement.
2. Mild left lower lobe infiltrate versus atelectasis. Small left
pleural effusion. Left pleural effusion has improved slightly from
prior exam.

## 2014-10-20 IMAGING — CR DG CHEST 2V
2 series · 2 of 2 positions shown · non-contrast
Comparison: January 20, 2013

CLINICAL DATA: Dyspnea, weakness, and known pleural effusions

EXAM:
CHEST  2 VIEW

[w chest pa]
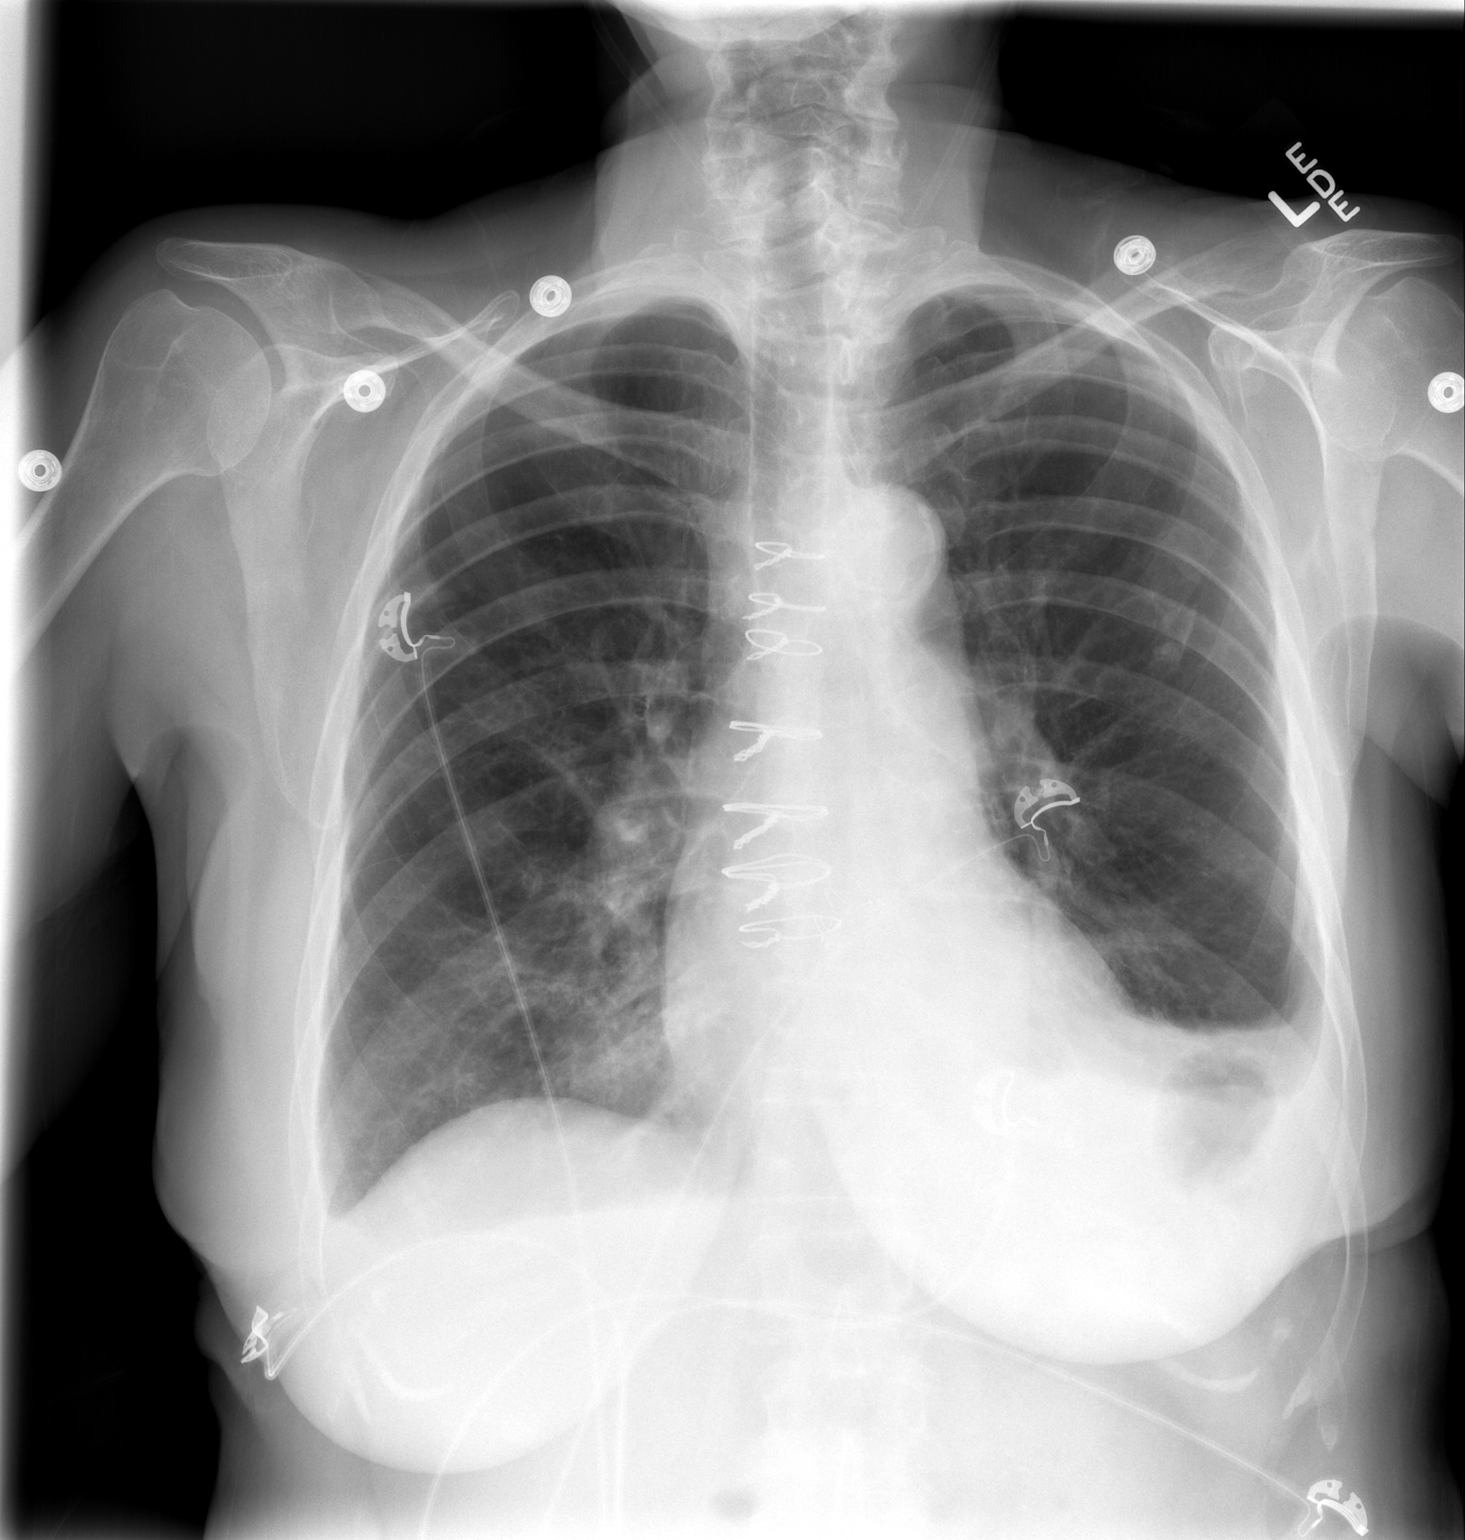

[w chest lat]
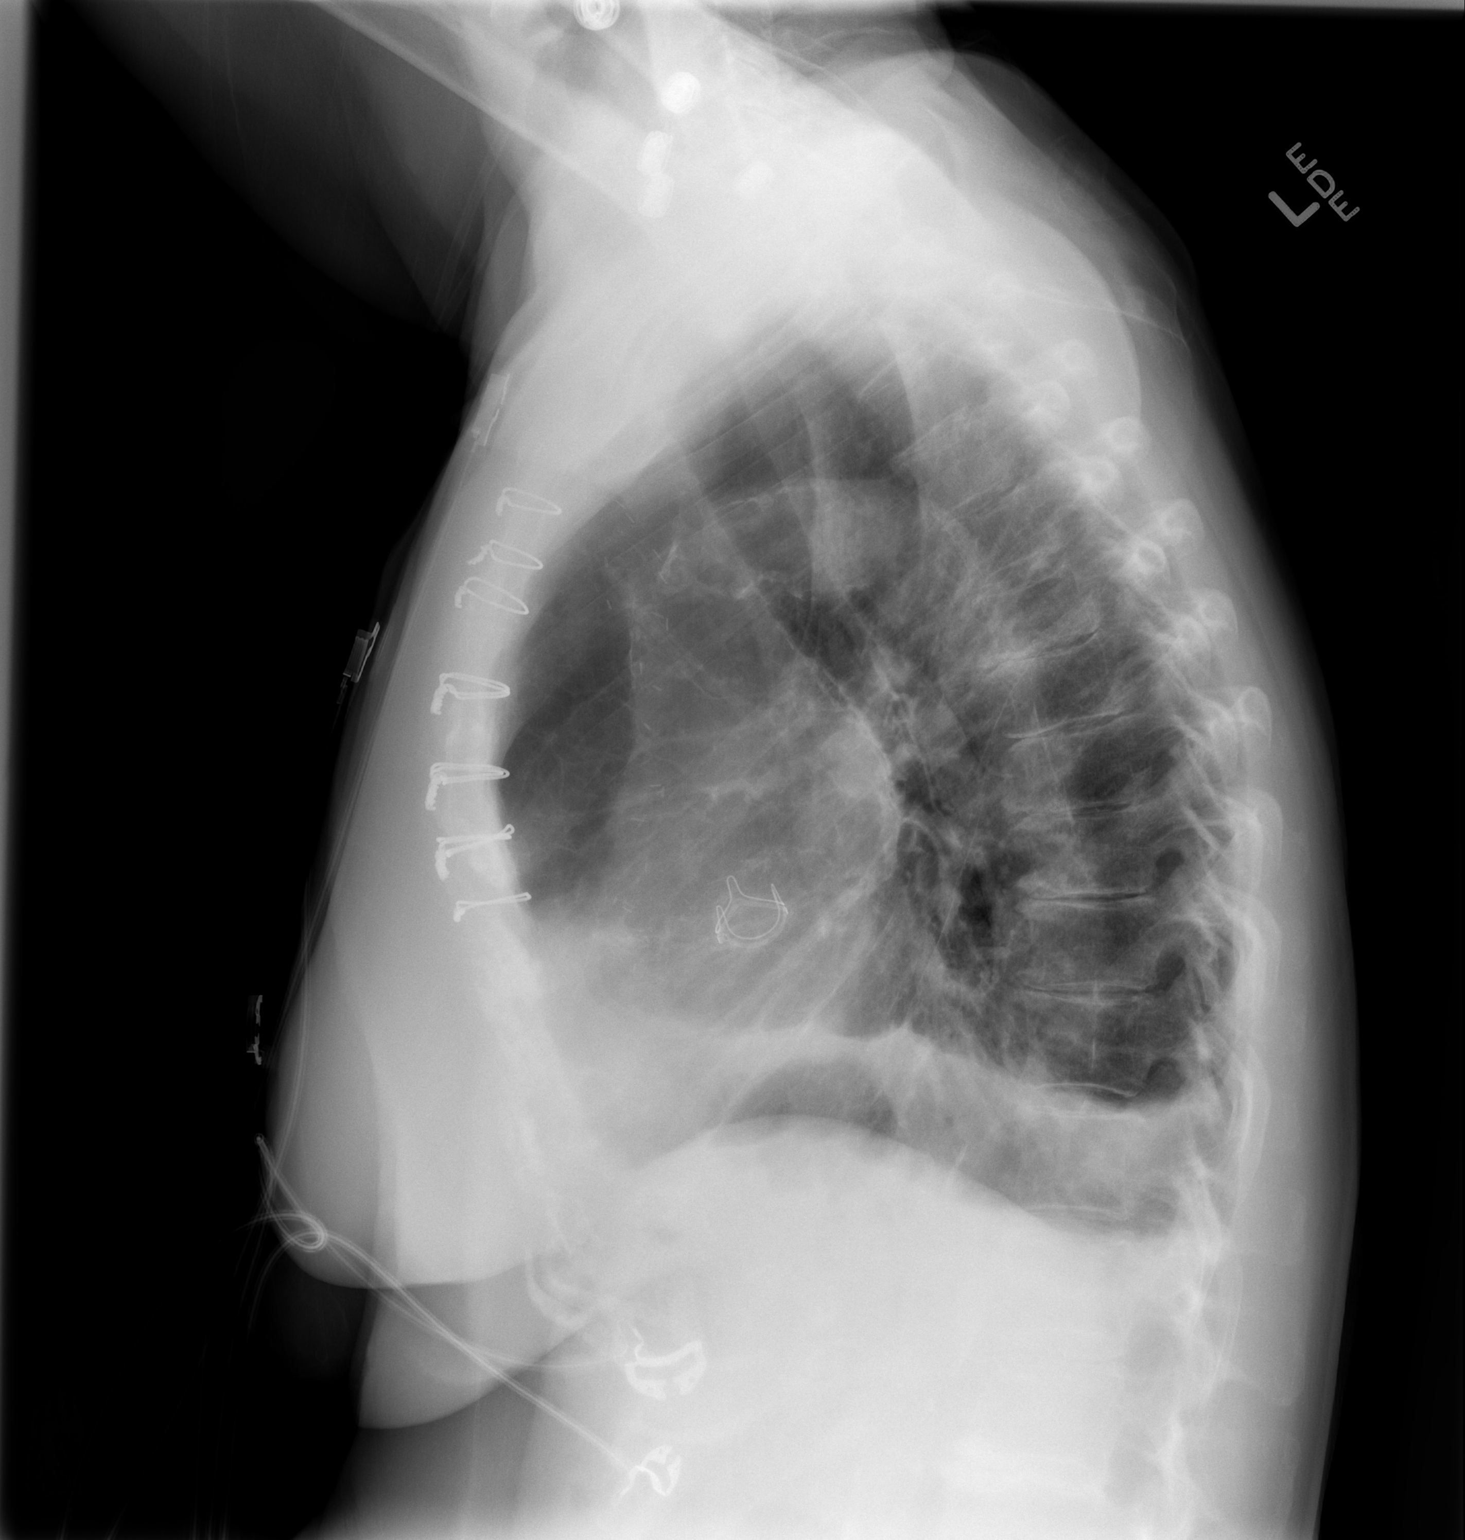

[2 of 2 positions shown; findings below may reference images not displayed]

FINDINGS: There is a moderate sized left-sided pleural effusion which has
slightly decreased in size since yesterday's study. On the right
there is a trace of pleural fluid blunting the costophrenic gutters.
This is slightly less conspicuous today. The cardiac silhouette is
normal in size. The pulmonary vascularity is not engorged. There is
no pneumothorax nor pneumomediastinum. The patient has undergone
previous median sternotomy and aortic valve replacement. The
observed portions of the bony thorax exhibit no acute abnormalities.
There is multi level degenerative disc space narrowing of the
thoracic spine.
IMPRESSION: There are persistent bilateral pleural effusions greater on the left
than on the right but these have slightly decreased in size since
yesterday's study. There is no focal pneumonia nor overt evidence of
CHF.

## 2014-12-28 ENCOUNTER — Other Ambulatory Visit: Payer: Self-pay | Admitting: Obstetrics & Gynecology

## 2014-12-30 LAB — CYTOLOGY - PAP

## 2015-01-01 ENCOUNTER — Ambulatory Visit (INDEPENDENT_AMBULATORY_CARE_PROVIDER_SITE_OTHER): Payer: Medicare Other | Admitting: Internal Medicine

## 2015-01-01 ENCOUNTER — Other Ambulatory Visit: Payer: Medicare Other

## 2015-01-01 ENCOUNTER — Encounter: Payer: Self-pay | Admitting: Internal Medicine

## 2015-01-01 VITALS — BP 128/68 | HR 86 | Ht 60.0 in | Wt 115.0 lb

## 2015-01-01 DIAGNOSIS — R0600 Dyspnea, unspecified: Secondary | ICD-10-CM | POA: Diagnosis not present

## 2015-01-01 DIAGNOSIS — J432 Centrilobular emphysema: Secondary | ICD-10-CM

## 2015-01-01 DIAGNOSIS — R0689 Other abnormalities of breathing: Secondary | ICD-10-CM

## 2015-01-01 MED ORDER — UMECLIDINIUM-VILANTEROL 62.5-25 MCG/INH IN AEPB: 1.0000 | INHALATION_SPRAY | Freq: Every day | RESPIRATORY_TRACT | Status: AC

## 2015-01-01 MED ORDER — FLUTICASONE FUROATE 100 MCG/ACT IN AEPB: 1.0000 | INHALATION_SPRAY | Freq: Every day | RESPIRATORY_TRACT | Status: AC

## 2015-01-01 NOTE — Patient Instructions (Signed)
ICD-9-CM ICD-10-CM   1. Centrilobular emphysema 492.8 J43.2   2. Dyspnea and respiratory abnormality 786.09 R06.00     R06.89    Curently stable but still with moderate symptom burden; we need to get you better than this Continue Arunity and Anoro daily as before  - take sample(s) Do HRCT chest wo contrast Do full PFT Do alpha 1 antitrypsin blood test  Followup  - with my NP Tammy  In a few weeks after completion of above

## 2015-01-01 NOTE — Progress Notes (Signed)
Subjective:    Patient ID: [name], female    DOB: 25-Dec-1931, 81 y.o.   MRN: [id number] [facility] WHITE,[name], MD  HPI  IOV 01/01/2015  Chief Complaint  Patient presents with  . Pulmonary Consult    Pt referred by Dr. Dema Severin for COPD. Pt saw [place] in 2013 for COPD.     New consult for COPD. Used to see Dr. Gwenette Greet.  over 3 years ago in this practice. But after that she said she followed up with primary care physician Dr. Dema Severin. She was being maintained on Flovent inhaled corticosteroid on a daily basis. This seemed to work well for her. In fall 2014 she had Aortic valve replacement according to her history. It was a bioprosthetic valve so she is not on any anticoagulation. She subsequently underwent cardiac rehabilitation but it appears that her dyspnea never improved. She then reset to a new lower baseline dyspnea on exertion that is moderate in severity. Was doing stable in this new lower baseline till early March 2016 when she had COPD exacerbation [review of outside records from 12/08/2014 notes diagnosis of acute bronchitis but because she was treated with Avelox and prednisone, my opinion is this was a COPD exacerbation]. She did not feel much improved. Subsequently in early April had similar treatment again. Somewhere along  inhalers got switched from inhaled corticosteroid alone to triple inhaler therapy using the new [employer] inhalers of Arnuity and ANoro. After that she's had a significant improvement. At this point in time she feels stable and in good health. In fact she came driving. In any event current symptom burden at this point in time where she is so-called feeling well is a COPD cat score of 24 as documented below.  Last CT scan of the chest was in fall 2014: No nodules showed emphysema diffuse  Spirometry October 2014: Mild obstruction only with significant bronchodilator response   CAT COPD Symptom & Quality of Life Score ([place]) 0 is no burden. 5 is  highest burden 01/01/2015   Never Cough -> Cough all the time 2  No phlegm in chest -> Chest is full of phlegm 3  No chest tightness -> Chest feels very tight 4  No dyspnea for 1 flight stairs/hill -> Very dyspneic for 1 flight of stairs 5  No limitations for ADL at home -> Very limited with ADL at home 3  Confident leaving home -> Not at all confident leaving home 3  Sleep soundly -> Do not sleep soundly because of lung condition 2  Lots of Energy -> No energy at all 2  TOTAL Score (max 40)  24       has a past medical history of Hypertension; Asthma; Diabetes mellitus; GERD (gastroesophageal reflux disease); Arthritis; Anxiety; Depression; Hyperlipidemia; Emphysema of lung; Glaucoma; [name]; Aortic stenosis; Murmur; and Nephrolithiasis.   reports that she quit smoking about 42 years ago. Her smoking use included Cigarettes. She has a 30 pack-year smoking history. She has never used smokeless tobacco.  Past Surgical History  Procedure Laterality Date  . Ankle surgery  1998    d/t fx.  Left ankle  . Facial cosmetic surgery      face lift  . Nose surgery    . Laparoscopic assisted vaginal hysterectomy  06/21/2011    Procedure: LAPAROSCOPIC ASSISTED VAGINAL HYSTERECTOMY;  Surgeon: Maisie Fus;  Location: [facility];  Service: Gynecology;  Laterality: N/A;  . Salpingoophorectomy  06/21/2011    Procedure:  SALPINGO OOPHERECTOMY;  Surgeon: Maisie Fus;  Location: [facility];  Service: Gynecology;  Laterality: Bilateral;  . Anterior and posterior repair  06/21/2011    Procedure: ANTERIOR (CYSTOCELE) AND POSTERIOR REPAIR (RECTOCELE);  Surgeon: Maisie Fus;  Location: [facility];  Service: Gynecology;  Laterality: N/A;  . Vaginal prolapse repair  06/21/2011    Procedure: SACROPEXY;  Surgeon: Maisie Fus;  Location: [facility];  Service: Gynecology;  Laterality: N/A;  sacrospinous ligament suspension  . Cardiac catheterization    . Aortic valve replacement N/A 07/17/2013     Procedure: AORTIC VALVE REPLACEMENT (AVR);  Surgeon: Ivin Poot, MD;  Location: [facility];  Service: Open Heart Surgery;  Laterality: N/A;  . Coronary artery bypass graft N/A 07/17/2013    Procedure: CORONARY ARTERY BYPASS GRAFT, TIMES ONE, ON PUMP, USING RIGHT INTERNAL MAMMARY ARTERY.;  Surgeon: Ivin Poot, MD;  Location: [place];  Service: Open Heart Surgery;  Laterality: N/A;  . Intraoperative transesophageal echocardiogram N/A 07/17/2013    Procedure: INTRAOPERATIVE TRANSESOPHAGEAL ECHOCARDIOGRAM;  Surgeon: Ivin Poot, MD;  Location: [place];  Service: Open Heart Surgery;  Laterality: N/A;  . Abdominal hysterectomy    . Cardiac valve replacement    . Coronary angioplasty    . Left and right heart catheterization with coronary angiogram N/A 06/27/2013    Procedure: LEFT AND RIGHT HEART CATHETERIZATION WITH CORONARY ANGIOGRAM;  Surgeon: Ramond Dial, MD;  Location: [facility] CATH LAB;  Service: Cardiovascular;  Laterality: N/A;    Allergies  Allergen Reactions  . Amaryl [Glimepiride]     Side effects  . Avelox [Moxifloxacin Hcl In Nacl]     nausea  . Ciprofloxacin     nausea  . Lipitor [Atorvastatin]     myalgias  . Lisinopril     cough  . Prevacid [Lansoprazole]     diarrhea  . Spiriva Handihaler [Tiotropium Bromide Monohydrate]     Increased cough  . Symbicort [Budesonide-Formoterol Fumarate]     Increased cough    Immunization History  Administered Date(s) Administered  . Influenza Split 07/19/2014  . Pneumococcal Conjugate-13 09/18/2013  . Zoster 08/18/2014    Family History  Problem Relation Age of Onset  . Emphysema Father   . Colon cancer Neg Hx   . Esophageal cancer Neg Hx   . Rectal cancer Neg Hx   . Stomach cancer Neg Hx      Current outpatient prescriptions:  .  acetaminophen (TYLENOL) 325 MG tablet, Take 650 mg by mouth as needed for pain. , Disp: , Rfl:  .  amLODipine (NORVASC) 10 MG tablet, Take 1 tablet (10 mg total) by mouth daily., Disp: 90  tablet, Rfl: 3 .  aspirin 325 MG EC tablet, Take 1 tablet (325 mg total) by mouth daily., Disp: 30 tablet, Rfl: 0 .  cholecalciferol (VITAMIN D) 1000 UNITS tablet, Take 1,000 Units by mouth daily., Disp: , Rfl:  .  Fluticasone Furoate (ARNUITY ELLIPTA IN), Inhale into the lungs., Disp: , Rfl:  .  furosemide (LASIX) 20 MG tablet, Take 1 tablet (20 mg total) by mouth every other day., Disp: 90 tablet, Rfl: 3 .  irbesartan (AVAPRO) 150 MG tablet, Take 1 tablet (150 mg total) by mouth daily., Disp: 90 tablet, Rfl: 3 .  Magnesium 250 MG TABS, Take 1 tablet by mouth daily., Disp: , Rfl:  .  Melatonin-Pyridoxine (MELATONEX PO), Take 1 tablet by mouth as needed., Disp: , Rfl:  .  Multiple Vitamins-Minerals (OCUVITE-LUTEIN PO), Take 1  tablet by mouth every morning. , Disp: , Rfl:  .  pravastatin (PRAVACHOL) 40 MG tablet, Take 40 mg by mouth daily. , Disp: , Rfl:  .  PROAIR HFA 108 (90 BASE) MCG/ACT inhaler, Inhale 2 puffs into the lungs 4 (four) times daily as needed., Disp: , Rfl:  .  sertraline (ZOLOFT) 50 MG tablet, Take 1 tablet (50 mg total) by mouth every morning., Disp: 30 tablet, Rfl: 1 .  Umeclidinium-Vilanterol 62.5-25 MCG/INH AEPB, Inhale 1 puff into the lungs daily., Disp: , Rfl:      Review of Systems  Constitutional: Negative for fever and unexpected weight change.  HENT: Positive for sneezing. Negative for congestion, dental problem, ear pain, nosebleeds, postnasal drip, rhinorrhea, sinus pressure, sore throat and trouble swallowing.   Eyes: Negative for redness and itching.  Respiratory: Positive for shortness of breath. Negative for cough, chest tightness and wheezing.   Cardiovascular: Negative for palpitations and leg swelling.  Gastrointestinal: Negative for nausea and vomiting.  Genitourinary: Negative for dysuria.  Musculoskeletal: Negative for joint swelling.  Skin: Negative for rash.  Neurological: Negative for headaches.  Hematological: Does not bruise/bleed easily.    Psychiatric/Behavioral: Negative for dysphoric mood. The patient is not nervous/anxious.        Objective:   Physical Exam  Constitutional: She is oriented to person, place, and time. She appears well-developed and well-nourished. No distress.  HENT:  Head: Normocephalic and atraumatic.  Right Ear: External ear normal.  Left Ear: External ear normal.  Mouth/Throat: Oropharynx is clear and moist. No oropharyngeal exudate.  Eyes: Conjunctivae and EOM are normal. Pupils are equal, round, and reactive to light. Right eye exhibits no discharge. Left eye exhibits no discharge. No scleral icterus.  Neck: Normal range of motion. Neck supple. No JVD present. No tracheal deviation present. No thyromegaly present.  Cardiovascular: Normal rate, regular rhythm, normal heart sounds and intact distal pulses.  Exam reveals no gallop and no friction rub.   No murmur heard. Pulmonary/Chest: Effort normal and breath sounds normal. No respiratory distress. She has no wheezes. She has no rales. She exhibits no tenderness.  Abdominal: Soft. Bowel sounds are normal. She exhibits no distension and no mass. There is no tenderness. There is no rebound and no guarding.  Musculoskeletal: Normal range of motion. She exhibits no edema or tenderness.  Lymphadenopathy:    She has no cervical adenopathy.  Neurological: She is alert and oriented to person, place, and time. She has normal reflexes. No cranial nerve deficit. She exhibits normal muscle tone. Coordination normal.  Skin: Skin is warm and dry. No rash noted. She is not diaphoretic. No erythema. No pallor.  Psychiatric: She has a normal mood and affect. Her behavior is normal. Judgment and thought content normal.  Vitals reviewed.   Filed Vitals:   01/01/15 1525  BP: 128/68  Pulse: 86  Height: 5' (1.524 m)  Weight: 115 lb (52.164 kg)  SpO2: 93%   Estimated body mass index is 22.46 kg/(m^2) as calculated from the following:   Height as of this  encounter: 5' (1.524 m).   Weight as of this encounter: 115 lb (52.164 kg).        Assessment & Plan:     ICD-9-CM ICD-10-CM   1. Centrilobular emphysema 492.8 J43.2 CT Chest High Resolution     Pulmonary function test     Alpha-1 antitrypsin phenotype  2. Dyspnea and respiratory abnormality 786.09 R06.00     R06.89    Curently stable but still  with moderate symptom burden; we need to get you better than this Continue Arunity and Anoro daily as before  - take sample(s) Do HRCT chest wo contrast Do full PFT Do alpha 1 antitrypsin blood test  Followup  - with my NP Tammy  In a few weeks after completion of above   Dr. Brand Males, M.D., [facility].C.P Pulmonary and Critical Care Medicine Staff Physician [place] [facility] Pager: [phone], If no answer or between  15:00h - 7:00h: call [phone]  01/01/2015 3:59 PM

## 2015-01-06 LAB — ALPHA-1-ANTITRYPSIN PHENOTYP: A1 ANTITRYPSIN: 187 mg/dL (ref 90–200)

## 2015-01-15 NOTE — Progress Notes (Signed)
Quick Note:  Called and spoke to pt. Informed pt of the results and recs per MR. Pt verbalized understanding and denied any further questions or concerns at this time. ______ 

## 2015-01-18 ENCOUNTER — Ambulatory Visit (HOSPITAL_COMMUNITY)
Admission: RE | Admit: 2015-01-18 | Discharge: 2015-01-18 | Disposition: A | Discharge status: Home / Self Care | Payer: Medicare Other | Source: Ambulatory Visit | Attending: Internal Medicine | Admitting: Internal Medicine

## 2015-01-18 ENCOUNTER — Ambulatory Visit (INDEPENDENT_AMBULATORY_CARE_PROVIDER_SITE_OTHER)
Admission: RE | Admit: 2015-01-18 | Discharge: 2015-01-18 | Disposition: A | Discharge status: Home / Self Care | Payer: Medicare Other | Source: Ambulatory Visit | Attending: Internal Medicine | Admitting: Internal Medicine

## 2015-01-18 DIAGNOSIS — J432 Centrilobular emphysema: Secondary | ICD-10-CM

## 2015-01-18 LAB — PULMONARY FUNCTION TEST
DL/VA % pred: 45 %
DL/VA: 1.85 ml/min/mmHg/L
DLCO UNC % PRED: 34 %
DLCO unc: 6.11 ml/min/mmHg
FEF 25-75 Post: 0.39 L/sec
FEF 25-75 Pre: 0.36 L/sec
FEF2575-%CHANGE-POST: 6 %
FEF2575-%Pred-Post: 34 %
FEF2575-%Pred-Pre: 32 %
FEV1-%Change-Post: 1 %
FEV1-%PRED-PRE: 80 %
FEV1-%Pred-Post: 81 %
FEV1-Post: 1.2 L
FEV1-Pre: 1.19 L
FEV1FVC-%Change-Post: 1 %
FEV1FVC-%Pred-Pre: 58 %
FEV6-%CHANGE-POST: 1 %
FEV6-%Pred-Post: 124 %
FEV6-%Pred-Pre: 122 %
FEV6-Post: 2.35 L
FEV6-Pre: 2.31 L
FEV6FVC-%Change-Post: 1 %
FEV6FVC-%PRED-POST: 91 %
FEV6FVC-%Pred-Pre: 90 %
FVC-%Change-Post: 0 %
FVC-%Pred-Post: 135 %
FVC-%Pred-Pre: 135 %
FVC-PRE: 2.72 L
FVC-Post: 2.72 L
Post FEV1/FVC ratio: 44 %
Post FEV6/FVC ratio: 86 %
Pre FEV1/FVC ratio: 44 %
Pre FEV6/FVC Ratio: 85 %
RV % pred: 97 %
RV: 2.11 L
TLC % PRED: 112 %
TLC: 4.82 L

## 2015-01-18 MED ORDER — ALBUTEROL SULFATE (2.5 MG/3ML) 0.083% IN NEBU
2.5000 mg | INHALATION_SOLUTION | Freq: Once | RESPIRATORY_TRACT | Status: AC
  Administered 2015-01-18: 2.5 mg via RESPIRATORY_TRACT

## 2015-01-20 ENCOUNTER — Telehealth: Payer: Self-pay | Admitting: Internal Medicine

## 2015-01-20 NOTE — Telephone Encounter (Addendum)
IMPRESSION: CT chest   1. Moderate to severe centrilobular emphysema. No evidence of superimposed interstitial lung disease.  2. Moderate hiatal hernia.   3. PFT c/w emphysema   Keep up fu with TP   Electronically Signed By: Lorin Picket M.D. On: 01/18/2015 10:12

## 2015-01-20 NOTE — Telephone Encounter (Signed)
Called and spoke to pt. Informed pt of the results and recs per MR. Pt verbalized understanding and denied any further questions or concerns at this time.  

## 2015-01-22 ENCOUNTER — Ambulatory Visit (INDEPENDENT_AMBULATORY_CARE_PROVIDER_SITE_OTHER): Payer: Medicare Other | Admitting: Adult Health

## 2015-01-22 ENCOUNTER — Encounter: Payer: Self-pay | Admitting: Adult Health

## 2015-01-22 VITALS — BP 134/68 | HR 72 | Temp 97.0°F | Ht 59.0 in | Wt 114.6 lb

## 2015-01-22 DIAGNOSIS — J432 Centrilobular emphysema: Secondary | ICD-10-CM

## 2015-01-22 DIAGNOSIS — J449 Chronic obstructive pulmonary disease, unspecified: Secondary | ICD-10-CM | POA: Diagnosis not present

## 2015-01-22 NOTE — Patient Instructions (Signed)
Continue on current regimen .  Refer to pulmonary rehab.  Follow up Dr. Chase Caller in 3 months and As needed

## 2015-01-22 NOTE — Progress Notes (Signed)
Subjective:    Patient ID: [name], female    DOB: Dec 09, 1931, 81 y.o.   MRN: [id number] [facility] Strong,Desiree S, MD  HPI  IOV 01/01/2015  Chief Complaint  Patient presents with  . Pulmonary Consult    Pt referred by Dr. Dema Severin for COPD. Pt saw [facility] in 2013 for COPD.     New consult for COPD. Used to see Dr. Gwenette Greet.  over 3 years ago in this practice. But after that she said she followed up with primary care physician Dr. Dema Severin. She was being maintained on Flovent inhaled corticosteroid on a daily basis. This seemed to work well for her. In fall 2014 she had Aortic valve replacement according to her history. It was a bioprosthetic valve so she is not on any anticoagulation. She subsequently underwent cardiac rehabilitation but it appears that her dyspnea never improved. She then reset to a new lower baseline dyspnea on exertion that is moderate in severity. Was doing stable in this new lower baseline till early March 2016 when she had COPD exacerbation [review of outside records from 12/08/2014 notes diagnosis of acute bronchitis but because she was treated with Avelox and prednisone, my opinion is this was a COPD exacerbation]. She did not feel much improved. Subsequently in early April had similar treatment again. Somewhere along  inhalers got switched from inhaled corticosteroid alone to triple inhaler therapy using the new [employer] inhalers of Arnuity and ANoro. After that she's had a significant improvement. At this point in time she feels stable and in good health. In fact she came driving. In any event current symptom burden at this point in time where she is so-called feeling well is a COPD cat score of 24 as documented below.  Last CT scan of the chest was in fall 2014: No nodules showed emphysema diffuse  Spirometry October 2014: Mild obstruction only with significant bronchodilator response   CAT COPD Symptom & Quality of Life Score ([place]) 0 is no burden. 5 is  highest burden 01/01/2015   Never Cough -> Cough all the time 2  No phlegm in chest -> Chest is full of phlegm 3  No chest tightness -> Chest feels very tight 4  No dyspnea for 1 flight stairs/hill -> Very dyspneic for 1 flight of stairs 5  No limitations for ADL at home -> Very limited with ADL at home 3  Confident leaving home -> Not at all confident leaving home 3  Sleep soundly -> Do not sleep soundly because of lung condition 2  Lots of Energy -> No energy at all 2  TOTAL Score (max 40)  24    01/22/15 Follow up :COPD  Patient returns for a two-week follow-up She underwent a pulmonary function test on May 2 that showed an FEV1 at 80%, ratio 44, DLCO 34%, no significant bronchodilator response. Similar to 2013 Discussed pulmonary rehabilitation. MR pt. here to discuss PFT and HRCT results. Pt reports breathing has unchanged. No wheezing, no chest tx, no cough. Would like samples of arnuity/anoro Alpha-1 was negative. Denies any chest pain, hemoptysis, orthopnea, PND or leg swelling.           Review of Systems  Constitutional: Negative for fever and unexpected weight change.  HENT:   Negative for congestion, dental problem, ear pain, nosebleeds, postnasal drip, rhinorrhea, sinus pressure, sore throat and trouble swallowing.   Eyes: Negative for redness and itching.  Respiratory: Positive for shortness of breath. Negative for cough, chest tightness  and wheezing.   Cardiovascular: Negative for palpitations and leg swelling.  Gastrointestinal: Negative for nausea and vomiting.  Genitourinary: Negative for dysuria.  Musculoskeletal: Negative for joint swelling.  Skin: Negative for rash.  Neurological: Negative for headaches.  Hematological: Does not bruise/bleed easily.  Psychiatric/Behavioral: Negative for dysphoric mood. The patient is not nervous/anxious.        Objective:   Physical Exam  Constitutional: She is oriented to person, place, and time. She appears  well-developed and well-nourished. No distress.  HENT:  Head: Normocephalic and atraumatic.  Right Ear: External ear normal.  Left Ear: External ear normal.  Mouth/Throat: Oropharynx is clear and moist. No oropharyngeal exudate.  Eyes: Conjunctivae and EOM are normal. Pupils are equal, round, and reactive to light. Right eye exhibits no discharge. Left eye exhibits no discharge. No scleral icterus.  Neck: Normal range of motion. Neck supple. No JVD present. No tracheal deviation present. No thyromegaly present.  Cardiovascular: Normal rate, regular rhythm, normal heart sounds and intact distal pulses.  Exam reveals no gallop and no friction rub.   No murmur heard. Pulmonary/Chest: Effort normal and breath sounds normal. No respiratory distress. She has no wheezes. She has no rales. She exhibits no tenderness.  Abdominal: Soft. Bowel sounds are normal. She exhibits no distension and no mass. There is no tenderness. There is no rebound and no guarding.  Musculoskeletal: Normal range of motion. She exhibits no edema or tenderness.  Lymphadenopathy:    She has no cervical adenopathy.  Neurological: She is alert and oriented to person, place, and time. She has normal reflexes. No cranial nerve deficit. She exhibits normal muscle tone. Coordination normal.  Skin: Skin is warm and dry. No rash noted. She is not diaphoretic. No erythema. No pallor.  Psychiatric: She has a normal mood and affect. Her behavior is normal. Judgment and thought content normal.  Vitals reviewed.

## 2015-01-29 NOTE — Assessment & Plan Note (Signed)
Continue on current regimen .  Refer to pulmonary rehab.  Follow up Dr. Chase Caller in 3 months and As needed

## 2015-02-22 ENCOUNTER — Encounter: Payer: Self-pay | Admitting: Internal Medicine

## 2015-02-22 ENCOUNTER — Encounter (INDEPENDENT_AMBULATORY_CARE_PROVIDER_SITE_OTHER): Payer: Self-pay

## 2015-02-22 ENCOUNTER — Ambulatory Visit (INDEPENDENT_AMBULATORY_CARE_PROVIDER_SITE_OTHER): Payer: Medicare Other | Admitting: Internal Medicine

## 2015-02-22 ENCOUNTER — Telehealth: Payer: Self-pay | Admitting: Internal Medicine

## 2015-02-22 VITALS — BP 134/76 | HR 70 | Ht 59.0 in | Wt 116.0 lb

## 2015-02-22 DIAGNOSIS — J449 Chronic obstructive pulmonary disease, unspecified: Secondary | ICD-10-CM

## 2015-02-22 DIAGNOSIS — R05 Cough: Secondary | ICD-10-CM

## 2015-02-22 DIAGNOSIS — R059 Cough, unspecified: Secondary | ICD-10-CM

## 2015-02-22 MED ORDER — PREDNISONE 10 MG PO TABS: ORAL_TABLET | ORAL | Status: DC

## 2015-02-22 MED ORDER — BECLOMETHASONE DIPROPIONATE 80 MCG/ACT IN AERS: INHALATION_SPRAY | RESPIRATORY_TRACT | Status: DC

## 2015-02-22 NOTE — Progress Notes (Signed)
Subjective:    Patient ID: [name], female    DOB: [birth year], 81 y.o.   MRN: [id number] [facility] WHITE,[name], MD  HPI  IOV 01/01/2015  Chief Complaint  Patient presents with  . Pulmonary Consult    Pt referred by Dr. Dema Severin for COPD. Pt saw [facility] in 2013 for COPD.     New consult for COPD. Used to see Dr. Gwenette Greet.  over 3 years ago in this practice. But after that she said she followed up with primary care physician Dr. Dema Severin. She was being maintained on Flovent inhaled corticosteroid on a daily basis. This seemed to work well for her. In fall 2014 she had Aortic valve replacement according to her history. It was a bioprosthetic valve so she is not on any anticoagulation. She subsequently underwent cardiac rehabilitation but it appears that her dyspnea never improved. She then reset to a new lower baseline dyspnea on exertion that is moderate in severity. Was doing stable in this new lower baseline till early March 2016 when she had COPD exacerbation [review of outside records from 12/08/2014 notes diagnosis of acute bronchitis but because she was treated with Avelox and prednisone, my opinion is this was a COPD exacerbation]. She did not feel much improved. Subsequently in early April had similar treatment again. Somewhere along  inhalers got switched from inhaled corticosteroid alone to triple inhaler therapy using the new [employer] inhalers of Arnuity and ANoro. After that she's had a significant improvement. At this point in time she feels stable and in good health. In fact she came driving. In any event current symptom burden at this point in time where she is so-called feeling well is a COPD cat score of 24 as documented below.  Last CT scan of the chest was in fall 2014: No nodules showed emphysema diffuse  Spirometry October 2014: Mild obstruction only with significant bronchodilator response   CAT COPD Symptom & Quality of Life Score ([place]) 0 is no burden. 5 is  highest burden 01/01/2015   Never Cough -> Cough all the time 2  No phlegm in chest -> Chest is full of phlegm 3  No chest tightness -> Chest feels very tight 4  No dyspnea for 1 flight stairs/hill -> Very dyspneic for 1 flight of stairs 5  No limitations for ADL at home -> Very limited with ADL at home 3  Confident leaving home -> Not at all confident leaving home 3  Sleep soundly -> Do not sleep soundly because of lung condition 2  Lots of Energy -> No energy at all 2  TOTAL Score (max 40)  24    01/22/15 Follow up :COPD  Patient returns for a two-week follow-up She underwent a pulmonary function test on May 2 that showed an FEV1 at 80%, ratio 44, DLCO 34%, no significant bronchodilator response. Similar to 2013 Discussed pulmonary rehabilitation. MR pt. here to discuss PFT and HRCT results. Pt reports breathing has unchanged. No wheezing, no chest tx, no cough. Would like samples of arnuity/anoro Alpha-1 was negative. rec Continue on current regimen .  Refer to pulmonary rehab.     02/22/2015  Acute ov/Katena Petitjean re: acute cough / quit smoking 1970s Chief Complaint  Patient presents with  . Acute Visit    MR pt here for cough X2 weeks.  c/o deep, crackling mostly nonprod cough.    acute onset/ dry hacking persistent day > noct already rxd for eye infections  = keflex/ no better  also no better with  Delsym/ mucinex Some better with proair   No obvious day to day or daytime variabilty or assoc worse sob or cp or chest tightness, subjective wheeze overt sinus or hb symptoms. No unusual exp hx or h/o childhood pna/ asthma or knowledge of premature birth.  Sleeping ok without nocturnal  or early am exacerbation  of respiratory  c/o's or need for noct saba. Also denies any obvious fluctuation of symptoms with weather or environmental changes or other aggravating or alleviating factors except as outlined above   Current Medications, Allergies, Complete Past Medical History, Past Surgical  History, Family History, and Social History were reviewed in [employer] record.  ROS  The following are not active complaints unless bolded sore throat, dysphagia, dental problems, itching, sneezing,  nasal congestion or excess/ purulent secretions, ear ache,   fever, chills, sweats, unintended wt loss, pleuritic or exertional cp, hemoptysis,  orthopnea pnd or leg swelling, presyncope, palpitations, heartburn, abdominal pain, anorexia, nausea, vomiting, diarrhea  or change in bowel or urinary habits, change in stools or urine, dysuria,hematuria,  rash, arthralgias, visual complaints, headache, numbness weakness or ataxia or problems with walking or coordination,  change in mood/affect or memory.                     Objective:     amb wf nad  Wt Readings from Last 3 Encounters:  02/22/15 116 lb (52.617 kg)  01/22/15 114 lb 9.6 oz (51.982 kg)  01/01/15 115 lb (52.164 kg)    Vital signs reviewed   HEENT: nl dentition, turbinates, and orophanx. Nl external ear canals without cough reflex   NECK :  without JVD/Nodes/TM/ nl carotid upstrokes bilaterally   LUNGS: no acc muscle use, clear to A and P bilaterally without cough on insp or exp maneuvers   CV:  RRR  no s3 or murmur or increase in P2, no edema   ABD:  soft and nontender with nl excursion in the supine position. No bruits or organomegaly, bowel sounds nl  MS:  warm without deformities, calf tenderness, cyanosis or clubbing  SKIN: warm and dry without lesions    NEURO:  alert, approp, no deficits    I personally reviewed images and agree with radiology impression as follows:  HRCT:  01/18/15 1. Moderate to severe centrilobular emphysema. No evidence of superimposed interstitial lung disease. 2. Enlarged pulmonary arteries, indicative of pulmonary arterial hypertension. 3. Moderate hiatal hernia.

## 2015-02-22 NOTE — Patient Instructions (Addendum)
Stop flovent = fluticasone / anuity  Qvar 80 Take 2 puffs first thing in am and then another 2 puffs about 12 hours later.   Work on inhaler technique:  relax and gently blow all the way out then take a nice smooth deep breath back in, triggering the inhaler at same time you start breathing in.  Hold for up to 5 seconds if you can.  Rinse and gargle with water when done    Prednisone 10 mg take  4 each am x 2 days,   2 each am x 2 days,  1 each am x 2 days and stop   Try over the counter prilosec '20mg'$   Take 30-60 min before first meal of the day and Pepcid (famotidine) 20 mg one bedtime until cough is completely gone for at least a week without the need for cough suppression  GERD (REFLUX)  is an extremely common cause of respiratory symptoms just like yours , many times with no obvious heartburn at all.    It can be treated with medication, but also with lifestyle changes including avoidance of late meals, elevation of the head of your bed (ideally with 6 inch  bed blocks) excessive alcohol, smoking cessation, and avoid fatty foods, chocolate, peppermint, colas, red wine, and acidic juices such as orange juice.  NO MINT OR MENTHOL PRODUCTS SO NO COUGH DROPS  USE SUGARLESS CANDY INSTEAD (Jolley ranchers or Stover's or Life Savers) or even ice chips will also do - the key is to swallow to prevent all throat clearing. NO OIL BASED VITAMINS - use powdered substitutes.    Keep appt to see [facility]

## 2015-02-22 NOTE — Telephone Encounter (Signed)
Spoke with pt.  C/o increased cough with small amount of white mucus this weekend.  Slight increased SOB.  OV scheduled with MW for today at 1:30.  Pt confirmed appt and voiced no further questions or concerns at this time.

## 2015-02-28 ENCOUNTER — Encounter: Payer: Self-pay | Admitting: Internal Medicine

## 2015-02-28 DIAGNOSIS — R059 Cough, unspecified: Secondary | ICD-10-CM | POA: Insufficient documentation

## 2015-02-28 DIAGNOSIS — R05 Cough: Secondary | ICD-10-CM | POA: Insufficient documentation

## 2015-02-28 NOTE — Assessment & Plan Note (Signed)
The most common causes of chronic cough in immunocompetent adults include the following: upper airway cough syndrome (UACS), previously referred to as postnasal drip syndrome (PNDS), which is caused by variety of rhinosinus conditions; (2) asthma; (3) GERD; (4) chronic bronchitis from cigarette smoking or other inhaled environmental irritants; (5) nonasthmatic eosinophilic bronchitis; and (6) bronchiectasis.   These conditions, singly or in combination, have accounted for up to 94% of the causes of chronic cough in prospective studies.   Other conditions have constituted no >6% of the causes in prospective studies These have included bronchogenic carcinoma, chronic interstitial pneumonia, sarcoidosis, left ventricular failure, ACEI-induced cough, and aspiration from a condition associated with pharyngeal dysfunction.    Chronic cough is often simultaneously caused by more than one condition. A single cause has been found from 38 to 82% of the time, multiple causes from 18 to 62%. Multiply caused cough has been the result of three diseases up to 42% of the time.      Cough may be related to gerd or uacs triggered by ICS or dpi's   rec trial of qvar 80 2bid in place of flovent Prednisone 10 mg take  4 each am x 2 days,   2 each am x 2 days,  1 each am x 2 days and stop  Max gerd rx F/u with Dr Chase Caller  I had an extended discussion with the patient reviewing all relevant studies completed to date and  lasting 15 to 20 minutes   Each maintenance medication was reviewed in detail including most importantly the difference between maintenance and as needed and under what circumstances the prns are to be used.  Please see instructions for details which were reviewed in writing and the patient given a copy.

## 2015-02-28 NOTE — Assessment & Plan Note (Addendum)
Actually not flaring despite cough / no need to change chronic rx = anoro unless cough persists  02/23/2015 p extensive coaching HFA effectiveness =    75%

## 2015-03-08 ENCOUNTER — Telehealth (HOSPITAL_COMMUNITY): Payer: Self-pay

## 2015-03-08 NOTE — Telephone Encounter (Signed)
I have called and left a message with Iva to inquire about participation in Pulmonary Rehab per Dr. Golden Pop referral. Will send letter in mail and follow up.

## 2015-03-15 ENCOUNTER — Ambulatory Visit (INDEPENDENT_AMBULATORY_CARE_PROVIDER_SITE_OTHER): Payer: Medicare Other | Admitting: Internal Medicine

## 2015-03-15 ENCOUNTER — Encounter: Payer: Self-pay | Admitting: Internal Medicine

## 2015-03-15 VITALS — BP 154/72 | Ht 60.0 in | Wt 117.6 lb

## 2015-03-15 DIAGNOSIS — J449 Chronic obstructive pulmonary disease, unspecified: Secondary | ICD-10-CM

## 2015-03-15 MED ORDER — HYDROCODONE-HOMATROPINE 5-1.5 MG/5ML PO SYRP: 5.0000 mL | ORAL_SOLUTION | Freq: Four times a day (QID) | ORAL | Status: DC | PRN

## 2015-03-15 NOTE — Progress Notes (Signed)
Subjective:    Patient ID: [name], female    DOB: 1932-08-18,  .   MRN: [id number] [facility] WHITE,[name], MD  HPI  IOV 01/01/2015  Chief Complaint  Patient presents with  . Pulmonary Consult    Pt referred by Dr. Dema Severin for COPD. Pt saw [facility] [facility] in 2013 for COPD.     New consult for COPD. Used to see Dr. Gwenette Greet.  over 3 years ago in this practice. But after that she said she followed up with primary care physician Dr. Dema Severin. She was being maintained on Flovent inhaled corticosteroid on a daily basis. This seemed to work well for her. In fall 2014 she had Aortic valve replacement according to her history. It was a bioprosthetic valve so she is not on any anticoagulation. She subsequently underwent cardiac rehabilitation but it appears that her dyspnea never improved. She then reset to a new lower baseline dyspnea on exertion that is moderate in severity. Was doing stable in this new lower baseline till early March 2016 when she had COPD exacerbation [review of outside records from 12/08/2014 notes diagnosis of acute bronchitis but because she was treated with Avelox and prednisone, my opinion is this was a COPD exacerbation]. She did not feel much improved. Subsequently in early April had similar treatment again. Somewhere along  inhalers got switched from inhaled corticosteroid alone to triple inhaler therapy using the new [employer] inhalers of Arnuity and ANoro. After that she's had a significant improvement. At this point in time she feels stable and in good health. In fact she came driving. In any event current symptom burden at this point in time where she is so-called feeling well is a COPD cat score of 24 as documented below.  Last CT scan of the chest was in fall 2014: No nodules showed emphysema diffuse  Spirometry October 2014: Mild obstruction only with significant bronchodilator response   CAT COPD Symptom & Quality of Life Score ([place]) 0 is no burden. 5 is highest  burden 01/01/2015   Never Cough -> Cough all the time 2  No phlegm in chest -> Chest is full of phlegm 3  No chest tightness -> Chest feels very tight 4  No dyspnea for 1 flight stairs/hill -> Very dyspneic for 1 flight of stairs 5  No limitations for ADL at home -> Very limited with ADL at home 3  Confident leaving home -> Not at all confident leaving home 3  Sleep soundly -> Do not sleep soundly because of lung condition 2  Lots of Energy -> No energy at all 2  TOTAL Score (max 40)  24    01/22/15 NP Follow up :COPD  Patient returns for a two-week follow-up She underwent a pulmonary function test on May 2 that showed an FEV1 at 80%, ratio 44, DLCO 34%, no significant bronchodilator response. Similar to 2013 Discussed pulmonary rehabilitation. MR pt. here to discuss PFT and HRCT results. Pt reports breathing has unchanged. No wheezing, no chest tx, no cough. Would like samples of arnuity/anoro Alpha-1 was negative. rec Continue on current regimen .  Referred  to pulmonary rehab.     02/22/2015  Acute ov/Aneta Hendershott re: acute cough / quit smoking 1970s Chief Complaint  Patient presents with  . Acute Visit    MR pt here for cough X2 weeks.  c/o deep, crackling mostly nonprod cough.    acute onset/ dry hacking persistent day > noct already rxd for eye infections  = keflex/  no better also no better with  Delsym/ mucinex Some better with proair  rec Stop flovent = fluticasone / anuity Qvar 80 Take 2 puffs first thing in am and then another 2 puffs about 12 hours later.  Work on inhaler technique:    Prednisone 10 mg take  4 each am x 2 days,   2 each am x 2 days,  1 each am x 2 days and stop  Try over the counter prilosec '20mg'$   Take 30-60 min before first meal of the day and Pepcid (famotidine) 20 mg one bedtime until cough is completely gone for at least a week without the need for cough suppression GERD (REFLUX) diet   03/15/2015 f/u ov/Ladarien Beeks re:   GOLD II Copd with cough >> sob still on  dpi/ not taking gerd rx as rec  Chief Complaint  Patient presents with  . Follow-up    Pt states her cough is only slightle better since her last visit. She is occ producing clear to white sputum.  She is using proair at least once per day.   Not limited by breathing from desired activities    No obvious day to day or daytime variabilty or assoc excess or purulent secretions or cp or chest tightness, subjective wheeze overt sinus or hb symptoms. No unusual exp hx or h/o childhood pna/ asthma or knowledge of premature birth.  Sleeping ok without nocturnal  or early am exacerbation  of respiratory  c/o's or need for noct saba. Also denies any obvious fluctuation of symptoms with weather or environmental changes or other aggravating or alleviating factors except as outlined above   Current Medications, Allergies, Complete Past Medical History, Past Surgical History, Family History, and Social History were reviewed in [employer] record.  ROS  The following are not active complaints unless bolded sore throat, dysphagia, dental problems, itching, sneezing,  nasal congestion or excess/ purulent secretions, ear ache,   fever, chills, sweats, unintended wt loss, pleuritic or exertional cp, hemoptysis,  orthopnea pnd or leg swelling, presyncope, palpitations, heartburn, abdominal pain, anorexia, nausea, vomiting, diarrhea  or change in bowel or urinary habits, change in stools or urine, dysuria,hematuria,  rash, arthralgias, visual complaints, headache, numbness weakness or ataxia or problems with walking or coordination,  change in mood/affect or memory.                     Objective:     amb wf nad rattling cough   03/15/2015       118  Wt Readings from Last 3 Encounters:  02/22/15 116 lb (52.617 kg)  01/22/15 114 lb 9.6 oz (51.982 kg)  01/01/15 115 lb (52.164 kg)    Vital signs reviewed   HEENT: nl dentition, turbinates, and orophanx. Nl external ear canals  without cough reflex   NECK :  without JVD/Nodes/TM/ nl carotid upstrokes bilaterally   LUNGS: no acc muscle use, clear to A and P bilaterally without cough on insp or exp maneuvers   CV:  RRR  no s3 or murmur or increase in P2, no edema   ABD:  soft and nontender with nl excursion in the supine position. No bruits or organomegaly, bowel sounds nl  MS:  warm without deformities, calf tenderness, cyanosis or clubbing  SKIN: warm and dry without lesions    NEURO:  alert, approp, no deficits    I personally reviewed images and agree with radiology impression as follows:  HRCT:  01/18/15 1. Moderate  to severe centrilobular emphysema. No evidence of superimposed interstitial lung disease. 2. Enlarged pulmonary arteries, indicative of pulmonary arterial hypertension. 3. Moderate hiatal hernia.

## 2015-03-15 NOTE — Patient Instructions (Addendum)
Stop all powder inhalers  dulera 100 Take 2 puffs first thing in am and then another 2 puffs about 12 hours later.   Only use your albuterol (proair) as a rescue medication to be used if you can't catch your breath by resting or doing a relaxed purse lip breathing pattern.  - The less you use it, the better it will work when you need it. - Ok to use up to 2 puffs  every 4 hours if you must but call for immediate appointment if use goes up over your usual need - Don't leave home without it !!  (think of it like the spare tire for your car)   Try prilosec '20mg'$   Take 30-60 min before first meal of the day and Pepcid (famotidine) 20 mg one bedtime until cough is completely gone for at least a week without the need for cough suppression  For cough > hycodan cough syrup if needed  See Tammy NP w/in 2 weeks to see if responding to dulera 100 sample

## 2015-03-17 ENCOUNTER — Encounter: Payer: Self-pay | Admitting: Internal Medicine

## 2015-03-17 MED ORDER — MOMETASONE FURO-FORMOTEROL FUM 100-5 MCG/ACT IN AERO: INHALATION_SPRAY | RESPIRATORY_TRACT | Status: DC

## 2015-03-17 NOTE — Assessment & Plan Note (Signed)
PFT"s 10/2011:  FEV1 1.24 (70%), ratio 51, FEV1 increased 19% with BD, no restriction, DLCO 29%.  She has enough reversibility to hope there is an asthmatic component to the cough and respond to low dose dulera or an upper airway component to the cough to respond to stopping anoro  The proper method of use, as well as anticipated side effects, of a metered-dose inhaler are discussed and demonstrated to the patient. Improved effectiveness after extensive coaching during this visit to a level of approximately  75%   I had an extended discussion with the patient reviewing all relevant studies completed to date and  lasting 15 to 20 minutes of a 25 minute visit and documenting with her that she did read the last set of instructions p lat ov and agreed to try to do better this time (I read them back to her and it was clear she was hearing it for the first time)   Each maintenance medication was reviewed in detail including most importantly the difference between maintenance and prns and under what circumstances the prns are to be triggered using an action plan format that is not reflected in the computer generated alphabetically organized AVS.    Please see instructions for details which were reviewed in writing and the patient given a copy highlighting the part that I personally wrote and discussed at today's ov.

## 2015-03-26 ENCOUNTER — Telehealth (HOSPITAL_COMMUNITY): Payer: Self-pay

## 2015-03-29 ENCOUNTER — Telehealth (HOSPITAL_COMMUNITY): Payer: Self-pay

## 2015-04-08 ENCOUNTER — Ambulatory Visit (INDEPENDENT_AMBULATORY_CARE_PROVIDER_SITE_OTHER): Payer: Medicare Other | Admitting: Adult Health

## 2015-04-08 ENCOUNTER — Encounter: Payer: Self-pay | Admitting: Adult Health

## 2015-04-08 VITALS — BP 142/70 | HR 70 | Temp 97.9°F | Ht 60.0 in | Wt 116.6 lb

## 2015-04-08 DIAGNOSIS — J449 Chronic obstructive pulmonary disease, unspecified: Secondary | ICD-10-CM | POA: Diagnosis not present

## 2015-04-08 DIAGNOSIS — R05 Cough: Secondary | ICD-10-CM | POA: Diagnosis not present

## 2015-04-08 DIAGNOSIS — R059 Cough, unspecified: Secondary | ICD-10-CM

## 2015-04-08 MED ORDER — MOMETASONE FURO-FORMOTEROL FUM 100-5 MCG/ACT IN AERO: INHALATION_SPRAY | RESPIRATORY_TRACT | Status: DC

## 2015-04-08 NOTE — Assessment & Plan Note (Signed)
Improved with GERD prevention and discontinuation of dry powder inhalers.   Plan  Cont on current regimen .

## 2015-04-08 NOTE — Assessment & Plan Note (Signed)
Recent flare now resolved   Plan  Continue on Dulera Twice daily  , rinse after use.  Follow up next month with Dr. Chase Caller and As needed

## 2015-04-08 NOTE — Patient Instructions (Signed)
Continue on Dulera Twice daily  , rinse after use.  Follow up next month with Dr. Chase Caller and As needed

## 2015-04-08 NOTE — Progress Notes (Signed)
Subjective:    Patient ID: [name], female    DOB: 1932-06-16,  .   MRN: [id number] [facility] WHITE,Desiree S, MD  HPI  IOV 01/01/2015  Chief Complaint  Patient presents with  . Pulmonary Consult    Pt referred by Dr. Dema Strong for COPD. Pt saw [place] in 2013 for COPD.     New consult for COPD. Used to see Dr. Gwenette Strong.  over 3 years ago in this practice. But after that she said she followed up with primary care physician Dr. Dema Strong. She was being maintained on Flovent inhaled corticosteroid on a daily basis. This seemed to work well for her. In fall 2014 she had Aortic valve replacement according to her history. It was a bioprosthetic valve so she is not on any anticoagulation. She subsequently underwent cardiac rehabilitation but it appears that her dyspnea never improved. She then reset to a new lower baseline dyspnea on exertion that is moderate in severity. Was doing stable in this new lower baseline till early March 2016 when she had COPD exacerbation [review of outside records from 12/08/2014 notes diagnosis of acute bronchitis but because she was treated with Avelox and prednisone, my opinion is this was a COPD exacerbation]. She did not feel much improved. Subsequently in early April had similar treatment again. Somewhere along  inhalers got switched from inhaled corticosteroid alone to triple inhaler therapy using the new [employer] inhalers of Arnuity and ANoro. After that she'Strong had a significant improvement. At this point in time she feels stable and in good health. In fact she came driving. In any event current symptom burden at this point in time where she is so-called feeling well is a COPD cat score of 24 as documented below.  Last CT scan of the chest was in fall 2014: No nodules showed emphysema diffuse  Spirometry October 2014: Mild obstruction only with significant bronchodilator response   CAT COPD Symptom & Quality of Life Score ([place]) 0 is no burden. 5 is highest  burden 01/01/2015   Never Cough -> Cough all the time 2  No phlegm in chest -> Chest is full of phlegm 3  No chest tightness -> Chest feels very tight 4  No dyspnea for 1 flight stairs/hill -> Very dyspneic for 1 flight of stairs 5  No limitations for ADL at home -> Very limited with ADL at home 3  Confident leaving home -> Not at all confident leaving home 3  Sleep soundly -> Do not sleep soundly because of lung condition 2  Lots of Energy -> No energy at all 2  TOTAL Score (max 40)  24    01/22/15 NP Follow up :COPD  Patient returns for a two-week follow-up She underwent a pulmonary function test on May 2 that showed an FEV1 at 80%, ratio 44, DLCO 34%, no significant bronchodilator response. Similar to 2013 Discussed pulmonary rehabilitation. MR pt. here to discuss PFT and HRCT results. Pt reports breathing has unchanged. No wheezing, no chest tx, no cough. Would like samples of arnuity/anoro Alpha-1 was negative. rec Continue on current regimen .  Referred  to pulmonary rehab.     02/22/2015  Acute ov/[name] re: acute cough / quit smoking 1970s Chief Complaint  Patient presents with  . Acute Visit    MR pt here for cough X2 weeks.  c/o deep, crackling mostly nonprod cough.    acute onset/ dry hacking persistent day > noct already rxd for eye infections  = keflex/  no better also no better with  Delsym/ mucinex Some better with proair  rec Stop flovent = fluticasone / anuity Qvar 80 Take 2 puffs first thing in am and then another 2 puffs about 12 hours later.  Work on inhaler technique:    Prednisone 10 mg take  4 each am x 2 days,   2 each am x 2 days,  1 each am x 2 days and stop  Try over the counter prilosec '20mg'$   Take 30-60 min before first meal of the day and Pepcid (famotidine) 20 mg one bedtime until cough is completely gone for at least a week without the need for cough suppression GERD (REFLUX) diet   03/15/2015 f/u ov/[name] re:   GOLD II Copd with cough >> sob still on  dpi/ not taking gerd rx as rec  Chief Complaint  Patient presents with  . Follow-up    Pt states her cough is only slightle better since her last visit. She is occ producing clear to white sputum.  She is using proair at least once per day.   Not limited by breathing from desired activities   >>changed to [facility]   04/08/2015 Follow up : COPD : Desiree Grissett NP  Pt returns for 1 month follow up .  Pt changed to [facility] last ov , says she is doing much better Cough is much improved.  She really likes [employer] but says it is a little pricey.  We discussed changing to nebs but she wants to stay on [facility] and  Will try to afford.  She denies fever, chest pain, orthopnea or edema.      Current Medications, Allergies, Complete Past Medical History, Past Surgical History, Family History, and Social History were reviewed in [employer] record.  ROS  The following are not active complaints unless bolded sore throat, dysphagia, dental problems, itching, sneezing,  nasal congestion or excess/ purulent secretions, ear ache,   fever, chills, sweats, unintended wt loss, pleuritic or exertional cp, hemoptysis,  orthopnea pnd or leg swelling, presyncope, palpitations, heartburn, abdominal pain, anorexia, nausea, vomiting, diarrhea  or change in bowel or urinary habits, change in stools or urine, dysuria,hematuria,  rash, arthralgias, visual complaints, headache, numbness weakness or ataxia or problems with walking or coordination,  change in mood/affect or memory.                     Objective:     amb wf nad rattling cough   03/15/2015       118  116 04/08/2015   Vital signs reviewed   HEENT: nl dentition, turbinates, and orophanx. Nl external ear canals without cough reflex   NECK :  without JVD/Nodes/TM/ nl carotid upstrokes bilaterally   LUNGS: no acc muscle use, clear to A and P bilaterally without cough on insp or exp maneuvers   CV:  RRR  no s3 or murmur or  increase in P2, no edema   ABD:  soft and nontender with nl excursion in the supine position. No bruits or organomegaly, bowel sounds nl  MS:  warm without deformities, calf tenderness, cyanosis or clubbing  SKIN: warm and dry without lesions    NEURO:  alert, approp, no deficits       HRCT:  01/18/15 1. Moderate to severe centrilobular emphysema. No evidence of superimposed interstitial lung disease. 2. Enlarged pulmonary arteries, indicative of pulmonary arterial hypertension. 3. Moderate hiatal hernia.

## 2015-05-03 ENCOUNTER — Encounter: Payer: Self-pay | Admitting: Internal Medicine

## 2015-05-03 ENCOUNTER — Ambulatory Visit (INDEPENDENT_AMBULATORY_CARE_PROVIDER_SITE_OTHER): Payer: Medicare Other | Admitting: Internal Medicine

## 2015-05-03 VITALS — BP 128/70 | HR 58 | Ht 60.0 in | Wt 116.2 lb

## 2015-05-03 DIAGNOSIS — J449 Chronic obstructive pulmonary disease, unspecified: Secondary | ICD-10-CM

## 2015-05-03 NOTE — Progress Notes (Signed)
Subjective:    Patient ID: [name], female    DOB: 1932-06-29, 81 y.o.   MRN: [id number]  HPI PC [name], MD  HPI  IOV 01/01/2015  Chief Complaint  Patient presents with  . Pulmonary Consult    Pt referred by Dr. Dema Severin for COPD. Pt saw [place] in 2013 for COPD.     New consult for COPD. Used to see Dr. Gwenette Greet.  over 3 years ago in this practice. But after that she said she followed up with primary care physician Dr. Dema Severin. She was being maintained on Flovent inhaled corticosteroid on a daily basis. This seemed to work well for her. In fall 2014 she had Aortic valve replacement according to her history. It was a bioprosthetic valve so she is not on any anticoagulation. She subsequently underwent cardiac rehabilitation but it appears that her dyspnea never improved. She then reset to a new lower baseline dyspnea on exertion that is moderate in severity. Was doing stable in this new lower baseline till early March 2016 when she had COPD exacerbation [review of outside records from 12/08/2014 notes diagnosis of acute bronchitis but because she was treated with Avelox and prednisone, my opinion is this was a COPD exacerbation]. She did not feel much improved. Subsequently in early April had similar treatment again. Somewhere along  inhalers got switched from inhaled corticosteroid alone to triple inhaler therapy using the new [employer] inhalers of Arnuity and ANoro. After that she's had a significant improvement. At this point in time she feels stable and in good health. In fact she came driving. In any event current symptom burden at this point in time where she is so-called feeling well is a COPD cat score of 24 as documented below.  Last CT scan of the chest was in fall 2014: No nodules showed emphysema diffuse  Spirometry October 2014: Mild obstruction only with significant bronchodilator response  01/22/15 NP Follow up :COPD  Patient returns for a two-week follow-up She  underwent a pulmonary function test on May 2 that showed an FEV1 at 80%, ratio 44, DLCO 34%, no significant bronchodilator response. Similar to 2013 Discussed pulmonary rehabilitation. MR pt. here to discuss PFT and HRCT results. Pt reports breathing has unchanged. No wheezing, no chest tx, no cough. Would like samples of arnuity/anoro Alpha-1 was negative.  rec Continue on current regimen .  Referred  to pulmonary rehab.     02/22/2015  Acute ov/[place] re: acute cough / quit smoking 1970s Chief Complaint  Patient presents with  . Acute Visit    MR pt here for cough X2 weeks.  c/o deep, crackling mostly nonprod cough.    acute onset/ dry hacking persistent day > noct already rxd for eye infections  = keflex/ no better also no better with  Delsym/ mucinex Some better with proair  rec Stop flovent = fluticasone / anuity Qvar 80 Take 2 puffs first thing in am and then another 2 puffs about 12 hours later.  Work on inhaler technique:    Prednisone 10 mg take  4 each am x 2 days,   2 each am x 2 days,  1 each am x 2 days and stop  Try over the counter prilosec '20mg'$   Take 30-60 min before first meal of the day and Pepcid (famotidine) 20 mg one bedtime until cough is completely gone for at least a week without the need for cough suppression GERD (REFLUX) diet   03/15/2015 f/u ov/[place] re:  GOLD II Copd with cough >> sob still on dpi/ not taking gerd rx as rec  Chief Complaint  Patient presents with  . Follow-up    Pt states her cough is only slightle better since her last visit. She is occ producing clear to white sputum.  She is using proair at least once per day.   Not limited by breathing from desired activities   >>changed to [facility]   04/08/2015 Follow up : COPD : [place] NP  Pt returns for 1 month follow up .  Pt changed to [facility] last ov , says she is doing much better Cough is much improved.  She really likes [employer] but says it is a little pricey.  We discussed changing to nebs  but she wants to stay on [facility] and  Will try to afford.  She denies fever, chest pain, orthopnea or edema.     OV 05/03/2015  COPD 2 with setting of moderate hiatal hernia    This is a routine follow-up. She is doing well. Most recently her inhaler got switched to [facility] because of cough. The powdered form of prior inhaler was removed by Dr. Melvyn Novas. After this she is doing well. Cough is improved. COPD stable. She tried to get into pulmonary rehabilitation but deferred due to cost of $80 per session. There are no new issues. COPD CAT score 14 and improved as below compared to aporil 2016.   CAT COPD Symptom & Quality of Life Score ([employer]) 0 is no burden. 5 is highest burden 01/01/2015  05/03/2015   Never Cough -> Cough all the time 2 1  No phlegm in chest -> Chest is full of phlegm 3 1  No chest tightness -> Chest feels very tight 4 1  No dyspnea for 1 flight stairs/hill -> Very dyspneic for 1 flight of stairs 5 4  No limitations for ADL at home -> Very limited with ADL at home 3 2  Confident leaving home -> Not at all confident leaving home 3 2  Sleep soundly -> Do not sleep soundly because of lung condition 2 2  Lots of Energy -> No energy at all 2 1  TOTAL Score (max 40)  24 14   Immunization History  Administered Date(s) Administered  . Influenza Split 07/19/2014  . Pneumococcal Conjugate-13 09/18/2013  . Zoster 08/18/2014        Current outpatient prescriptions:  .  acetaminophen (TYLENOL) 325 MG tablet, Take 650 mg by mouth as needed for pain. , Disp: , Rfl:  .  amLODipine (NORVASC) 10 MG tablet, Take 1 tablet (10 mg total) by mouth daily., Disp: 90 tablet, Rfl: 3 .  aspirin 325 MG EC tablet, Take 1 tablet (325 mg total) by mouth daily., Disp: 30 tablet, Rfl: 0 .  bacitracin ophthalmic ointment, Place into both eyes as needed., Disp: , Rfl: 0 .  cholecalciferol (VITAMIN D) 1000 UNITS tablet, Take 1,000 Units by mouth daily., Disp: , Rfl:  .  furosemide (LASIX)  20 MG tablet, Take 1 tablet (20 mg total) by mouth every other day., Disp: 90 tablet, Rfl: 3 .  HYDROcodone-homatropine (HYCODAN) 5-1.5 MG/5ML syrup, Take 5 mLs by mouth every 6 (six) hours as needed for cough., Disp: 240 mL, Rfl: 0 .  irbesartan (AVAPRO) 150 MG tablet, Take 1 tablet (150 mg total) by mouth daily., Disp: 90 tablet, Rfl: 3 .  Magnesium 250 MG TABS, Take 1 tablet by mouth daily., Disp: , Rfl:  .  Melatonin-Pyridoxine ([place]  PO), Take 1 tablet by mouth as needed., Disp: , Rfl:  .  mometasone-formoterol (DULERA) 100-5 MCG/ACT AERO, Take 2 puffs first thing in am and then another 2 puffs about 12 hours later., Disp: 1 Inhaler, Rfl: 5 .  Multiple Vitamins-Minerals (OCUVITE-LUTEIN PO), Take 1 tablet by mouth every morning. , Disp: , Rfl:  .  ofloxacin (OCUFLOX) 0.3 % ophthalmic solution, Place into both eyes as needed., Disp: , Rfl: 0 .  pravastatin (PRAVACHOL) 40 MG tablet, Take 40 mg by mouth daily. , Disp: , Rfl:  .  PROAIR HFA 108 (90 BASE) MCG/ACT inhaler, Inhale 2 puffs into the lungs 4 (four) times daily as needed., Disp: , Rfl:  .  sertraline (ZOLOFT) 50 MG tablet, Take 1 tablet (50 mg total) by mouth every morning., Disp: 30 tablet, Rfl: 1   Review of Systems  Constitutional: Negative for fever and unexpected weight change.  HENT: Negative for congestion, dental problem, ear pain, nosebleeds, postnasal drip, rhinorrhea, sinus pressure, sneezing, sore throat and trouble swallowing.   Eyes: Negative for redness and itching.  Respiratory: Positive for cough. Negative for chest tightness, shortness of breath and wheezing.   Cardiovascular: Negative for palpitations and leg swelling.  Gastrointestinal: Negative for nausea and vomiting.  Genitourinary: Negative for dysuria.  Musculoskeletal: Negative for joint swelling.  Skin: Negative for rash.  Neurological: Negative for headaches.  Hematological: Does not bruise/bleed easily.  Psychiatric/Behavioral: Negative for  dysphoric mood. The patient is not nervous/anxious.        Objective:   Physical Exam  Constitutional: She is oriented to person, place, and time. She appears well-developed and well-nourished. No distress.  Body mass index is 22.69 kg/(m^2).   HENT:  Head: Normocephalic and atraumatic.  Right Ear: External ear normal.  Left Ear: External ear normal.  Mouth/Throat: Oropharynx is clear and moist. No oropharyngeal exudate.  Eyes: Conjunctivae and EOM are normal. Pupils are equal, round, and reactive to light. Right eye exhibits no discharge. Left eye exhibits no discharge. No scleral icterus.  Neck: Normal range of motion. Neck supple. No JVD present. No tracheal deviation present. No thyromegaly present.  Cardiovascular: Normal rate, regular rhythm, normal heart sounds and intact distal pulses.  Exam reveals no gallop and no friction rub.   No murmur heard. Pulmonary/Chest: Effort normal and breath sounds normal. No respiratory distress. She has no wheezes. She has no rales. She exhibits no tenderness.  barrell chest  Abdominal: Soft. Bowel sounds are normal. She exhibits no distension and no mass. There is no tenderness. There is no rebound and no guarding.  Musculoskeletal: Normal range of motion. She exhibits no edema or tenderness.  Lymphadenopathy:    She has no cervical adenopathy.  Neurological: She is alert and oriented to person, place, and time. She has normal reflexes. No cranial nerve deficit. She exhibits normal muscle tone. Coordination normal.  Skin: Skin is warm and dry. No rash noted. She is not diaphoretic. No erythema. No pallor.  Psychiatric: She has a normal mood and affect. Her behavior is normal. Judgment and thought content normal.  Vitals reviewed.   Filed Vitals:   05/03/15 1416  BP: 128/70  Pulse: 58  Height: 5' (1.524 m)  Weight: 116 lb 3.2 oz (52.708 kg)  SpO2: 95%          Assessment & Plan:     ICD-9-CM ICD-10-CM   1. COPD GOLD II  496 J44.9      Stable copd  plan Continue on Dulera Twice  daily  , rinse after use.  Have flu shot in fall YHou need Pneumovax (old type) pneumonia vaccine   - talk to your pcp  WHITE,[name], MD and get it if not done already Call insurance company and ask them about rehab v silver snealker exercise options for copd  Follow up  -  6 month with Dr. Chase Caller and As needed     Dr. Brand Males, M.D., [facility].C.P Pulmonary and Critical Care Medicine Staff Physician [place] [facility] Pager: [phone], If no answer or between  15:00h - 7:00h: call [phone]  05/03/2015 2:37 PM

## 2015-05-03 NOTE — Patient Instructions (Addendum)
ICD-9-CM ICD-10-CM   1. COPD GOLD II  496 J44.9     Stable copd  plan Continue on Dulera Twice daily  , rinse after use.  Have flu shot in fall YHou need Pneumovax (old type) pneumonia vaccine   - talk to your pcp  WHITE,[name], MD and get it if not done already Call insurance company and ask them about rehab v silver snealker exercise options for copd  Follow up  -  6 month with Dr. Chase Caller and As needed

## 2015-08-19 ENCOUNTER — Encounter: Payer: Self-pay | Admitting: Cardiology

## 2015-09-21 ENCOUNTER — Telehealth: Payer: Self-pay | Admitting: *Deleted

## 2015-09-21 NOTE — Telephone Encounter (Signed)
Initiated PA for [employer] thru [facility] [phone] [id number] Pt ID: [id number] Will await response.  Pharmacy: [facility]   [phone]

## 2015-09-22 NOTE — Telephone Encounter (Signed)
PA for [facility] was denied. Spoke with [facility] and alternatives include: Advair (both diskus and HFA) and Breo. They also allow Anoro and Symbicort but per their records patient has already tried and failed these. Please advise as to which medication patient can switch to. Thank you.

## 2015-09-22 NOTE — Telephone Encounter (Signed)
Received fax the [facility] was denied. Insurance didn't give any alternatives.

## 2015-09-24 NOTE — Telephone Encounter (Signed)
Left message for patient to call back  

## 2015-09-24 NOTE — Telephone Encounter (Signed)
[phone] calling back

## 2015-09-24 NOTE — Telephone Encounter (Signed)
Called spoke with pt. She reports her PCP already called in an alternative and is taking this.

## 2015-09-24 NOTE — Telephone Encounter (Signed)
Can Try BREO daily -make sure she rinses well after taking  BREO #1 1 puff daily #1 , 5 refills.  Make sure she keeps her follow up

## 2015-09-28 ENCOUNTER — Ambulatory Visit (INDEPENDENT_AMBULATORY_CARE_PROVIDER_SITE_OTHER): Payer: Medicare Other | Admitting: Podiatry

## 2015-09-28 ENCOUNTER — Encounter: Payer: Self-pay | Admitting: Podiatry

## 2015-09-28 VITALS — BP 155/88 | HR 84 | Resp 14

## 2015-09-28 DIAGNOSIS — M79673 Pain in unspecified foot: Secondary | ICD-10-CM

## 2015-09-28 DIAGNOSIS — B351 Tinea unguium: Secondary | ICD-10-CM

## 2015-09-28 DIAGNOSIS — M79609 Pain in unspecified limb: Principal | ICD-10-CM

## 2015-09-28 NOTE — Progress Notes (Addendum)
   Subjective:    Patient ID: [name], female    DOB: 24-Jun-1932, 82 y.o.   MRN: [id number]  HPI this patient presents to the office with chief complaint of painful toenails on both feet. He says her nails have grown thick and long, especially since she spent time in the hospital. She says her nails appear to be ingrown on the second and third toes of the left foot. She denies any drainage from the sites. She presents the office for evaluation and treatment of her nails The patient presents here today with b/l toenail debridement and a possible ingrown left 2nd and 3rd toenails.  Review of Systems  All other systems reviewed and are negative.      Objective:   Physical Exam GENERAL APPEARANCE: Alert, conversant. Appropriately groomed. No acute distress.  VASCULAR: Pedal pulses palpable at  [facility] and PT bilateral.  Capillary refill time is immediate to all digits,  Normal temperature gradient.  Digital hair growth is present bilateral  NEUROLOGIC: sensation is normal to 5.07 monofilament at 5/5 sites bilateral.  Light touch is intact bilateral, Muscle strength normal.  MUSCULOSKELETAL: acceptable muscle strength, tone and stability bilateral.  Intrinsic muscluature intact bilateral.  Rectus appearance of foot and digits noted bilateral.   DERMATOLOGIC: skin color, texture, and turgor are within normal limits.  No preulcerative lesions or ulcers  are seen, no interdigital maceration noted.  No open lesions present.  No drainage noted.  Thick disfigured discolored ingrown toenails both feet.         Assessment & Plan:  Onychomycosis B/L   Debridement and grinding of nails both feet.  Gardiner Barefoot DPM

## 2015-10-07 ENCOUNTER — Encounter: Payer: Self-pay | Admitting: Gastroenterology

## 2015-10-12 ENCOUNTER — Ambulatory Visit: Payer: Medicare Other | Admitting: Cardiology

## 2015-10-18 NOTE — Progress Notes (Signed)
HPI: FU aortic stenosis/s/p AVR and CAD. Patient underwent cardiac catheterization in October of 2014. She was found to have a 70% LAD and there was a 40-50% mid RCA. Preoperative carotid Dopplers in October 2014 showed 1-39% bilateral stenosis. In October of 2014 the patient underwent aortic valve replacement with a pericardial valve and coronary artery bypassing graft with a LIMA to the LAD. Echocardiogram in December of 2014 showed normal LV function, grade 1 diastolic dysfunction, bioprosthetic aortic valve with a mean gradient of 19 mm of mercury, mild left atrial enlargement. Since I last saw her, the patient has dyspnea with more extreme activities but not with routine activities. It is relieved with rest. It is not associated with chest pain. There is no orthopnea, PND or pedal edema. There is no syncope or palpitations. There is no exertional chest pain.   Current Outpatient Prescriptions  Medication Sig Dispense Refill  . acetaminophen (TYLENOL) 325 MG tablet Take 650 mg by mouth as needed for pain.     [name] amLODipine (NORVASC) 10 MG tablet Take 1 tablet (10 mg total) by mouth daily. 90 tablet 3  . aspirin 325 MG EC tablet Take 1 tablet (325 mg total) by mouth daily. 30 tablet 0  . cholecalciferol (VITAMIN D) 1000 UNITS tablet Take 1,000 Units by mouth daily.    . furosemide (LASIX) 20 MG tablet Take 1 tablet (20 mg total) by mouth every other day. 90 tablet 3  . HYDROcodone-homatropine (HYCODAN) 5-1.5 MG/5ML syrup Take 5 mLs by mouth every 6 (six) hours as needed for cough. 240 mL 0  . irbesartan (AVAPRO) 150 MG tablet Take 1 tablet (150 mg total) by mouth daily. 90 tablet 3  . Magnesium 250 MG TABS Take 1 tablet by mouth daily.    . Melatonin-Pyridoxine (MELATONEX PO) Take 1 tablet by mouth as needed.    . mometasone-formoterol (DULERA) 100-5 MCG/ACT AERO Take 2 puffs first thing in am and then another 2 puffs about 12 hours later. 1 Inhaler 5  . Multiple Vitamins-Minerals  (OCUVITE-LUTEIN PO) Take 1 tablet by mouth every morning.     [name] ofloxacin (OCUFLOX) 0.3 % ophthalmic solution Place into both eyes as needed.  0  . pravastatin (PRAVACHOL) 40 MG tablet Take 20 mg by mouth daily.     [name] PROAIR HFA 108 (90 BASE) MCG/ACT inhaler Inhale 2 puffs into the lungs 4 (four) times daily as needed.    . sertraline (ZOLOFT) 50 MG tablet Take 1 tablet (50 mg total) by mouth every morning. 30 tablet 1   No current facility-administered medications for this visit.     Past Medical History  Diagnosis Date  . Hypertension   . Asthma     uses inhaler as needed  . Diabetes mellitus   . GERD (gastroesophageal reflux disease)   . Arthritis   . Anxiety   . Depression   . Hyperlipidemia   . Emphysema of lung ([place])   . Glaucoma   . [name]   . Aortic stenosis   . Murmur   . Nephrolithiasis     Past Surgical History  Procedure Laterality Date  . Ankle surgery  1998    d/t fx.  Left ankle  . Facial cosmetic surgery      face lift  . Nose surgery    . Laparoscopic assisted vaginal hysterectomy  06/21/2011    Procedure: LAPAROSCOPIC ASSISTED VAGINAL HYSTERECTOMY;  Surgeon: Maisie Fus;  Location: [facility];  Service: Gynecology;  Laterality: N/A;  . Salpingoophorectomy  06/21/2011    Procedure: SALPINGO OOPHERECTOMY;  Surgeon: Maisie Fus;  Location: [facility];  Service: Gynecology;  Laterality: Bilateral;  . Anterior and posterior repair  06/21/2011    Procedure: ANTERIOR (CYSTOCELE) AND POSTERIOR REPAIR (RECTOCELE);  Surgeon: Maisie Fus;  Location: [facility];  Service: Gynecology;  Laterality: N/A;  . Vaginal prolapse repair  06/21/2011    Procedure: SACROPEXY;  Surgeon: Maisie Fus;  Location: [facility];  Service: Gynecology;  Laterality: N/A;  sacrospinous ligament suspension  . Cardiac catheterization    . Aortic valve replacement N/A 07/17/2013    Procedure: AORTIC VALVE REPLACEMENT (AVR);  Surgeon: Ivin Poot, MD;  Location: [facility];   Service: Open Heart Surgery;  Laterality: N/A;  . Coronary artery bypass graft N/A 07/17/2013    Procedure: CORONARY ARTERY BYPASS GRAFT, TIMES ONE, ON PUMP, USING RIGHT INTERNAL MAMMARY ARTERY.;  Surgeon: Ivin Poot, MD;  Location: [facility];  Service: Open Heart Surgery;  Laterality: N/A;  . Intraoperative transesophageal echocardiogram N/A 07/17/2013    Procedure: INTRAOPERATIVE TRANSESOPHAGEAL ECHOCARDIOGRAM;  Surgeon: Ivin Poot, MD;  Location: [facility];  Service: Open Heart Surgery;  Laterality: N/A;  . Abdominal hysterectomy    . Cardiac valve replacement    . Coronary angioplasty    . Left and right heart catheterization with coronary angiogram N/A 06/27/2013    Procedure: LEFT AND RIGHT HEART CATHETERIZATION WITH CORONARY ANGIOGRAM;  Surgeon: Ramond Dial, MD;  Location: [facility];  Service: Cardiovascular;  Laterality: N/A;    Social History   Social History  . Marital Status: Married    Spouse Name: Jenny Reichmann  . Number of Children: 1  . Years of Education: N/A   Occupational History  . retired Network engineer   .     Social History Main Topics  . Smoking status: Former Smoker -- 2.00 packs/day for 15 years    Types: Cigarettes    Quit date: 09/18/1972  . Smokeless tobacco: Never Used  . Alcohol Use: No  . Drug Use: No  . Sexual Activity: Not on file   Other Topics Concern  . Not on file   Social History Narrative    Family History  Problem Relation Age of Onset  . Emphysema Father   . Colon cancer Neg Hx   . Esophageal cancer Neg Hx   . Rectal cancer Neg Hx   . Stomach cancer Neg Hx     ROS: no fevers or chills, productive cough, hemoptysis, dysphasia, odynophagia, melena, hematochezia, dysuria, hematuria, rash, seizure activity, orthopnea, PND, pedal edema, claudication. Remaining systems are negative.  Physical Exam: Well-developed well-nourished in no acute distress.  Skin is warm and dry.  HEENT is normal.  Neck is supple. Bilateral bruits Chest  is clear to auscultation with normal expansion.  Cardiovascular exam is regular rate and rhythm. 1/6 systolic murmur left sternal border. No diastolic murmur. Abdominal exam nontender or distended. No masses palpated. Extremities show no edema. neuro grossly intact  ECG Normal sinus rhythm at a rate of 67. Left anterior fascicular block. Right bundle branch block. PACs.

## 2015-10-19 ENCOUNTER — Encounter: Payer: Self-pay | Admitting: Cardiology

## 2015-10-19 ENCOUNTER — Ambulatory Visit (INDEPENDENT_AMBULATORY_CARE_PROVIDER_SITE_OTHER): Payer: Medicare Other | Admitting: Cardiology

## 2015-10-19 VITALS — BP 130/70 | HR 66 | Ht 60.0 in | Wt 115.1 lb

## 2015-10-19 DIAGNOSIS — I350 Nonrheumatic aortic (valve) stenosis: Secondary | ICD-10-CM | POA: Diagnosis not present

## 2015-10-19 DIAGNOSIS — I10 Essential (primary) hypertension: Secondary | ICD-10-CM | POA: Diagnosis not present

## 2015-10-19 DIAGNOSIS — Z954 Presence of other heart-valve replacement: Secondary | ICD-10-CM

## 2015-10-19 DIAGNOSIS — Z952 Presence of prosthetic heart valve: Secondary | ICD-10-CM

## 2015-10-19 DIAGNOSIS — R0989 Other specified symptoms and signs involving the circulatory and respiratory systems: Secondary | ICD-10-CM | POA: Diagnosis not present

## 2015-10-19 DIAGNOSIS — E785 Hyperlipidemia, unspecified: Secondary | ICD-10-CM

## 2015-10-19 DIAGNOSIS — Z951 Presence of aortocoronary bypass graft: Secondary | ICD-10-CM

## 2015-10-19 MED ORDER — AMLODIPINE BESYLATE 10 MG PO TABS: 10.0000 mg | ORAL_TABLET | Freq: Every day | ORAL | Status: AC

## 2015-10-19 NOTE — Assessment & Plan Note (Signed)
Schedule carotid Dopplers. 

## 2015-10-19 NOTE — Assessment & Plan Note (Signed)
Continue SBE prophylaxis. Repeat echocardiogram.

## 2015-10-19 NOTE — Assessment & Plan Note (Addendum)
Continue aspirin (change to 81 mg daily) and statin.

## 2015-10-19 NOTE — Assessment & Plan Note (Signed)
Continue statin. 

## 2015-10-19 NOTE — Assessment & Plan Note (Signed)
Blood pressure controlled. Continue present medications. 

## 2015-10-19 NOTE — Patient Instructions (Signed)
Medication Instructions:  Your physician recommends that you continue on your current medications as directed. Please refer to the Current Medication list given to you today. DECREASE ASPRIRIN TO 81  MG   Labwork: NONE  Testing/Procedures: Your physician has requested that you have an echocardiogram. Echocardiography is a painless test that uses sound waves to create images of your heart. It provides your doctor with information about the size and shape of your heart and how well your heart's chambers and valves are working. This procedure takes approximately one hour. There are no restrictions for this procedure. Your physician has requested that you have a carotid duplex. This test is an ultrasound of the carotid arteries in your neck. It looks at blood flow through these arteries that supply the brain with blood. Allow one hour for this exam. There are no restrictions or special instructions.   Follow-Up: Your physician wants you to follow-up in: [place] will receive a reminder letter in the mail two months in advance. If you don't receive a letter, please call our office to schedule the follow-up appointment.  Any Other Special Instructions Will Be Listed Below (If Applicable).     If you need a refill on your cardiac medications before your next appointment, please call your pharmacy.

## 2015-11-01 ENCOUNTER — Ambulatory Visit (INDEPENDENT_AMBULATORY_CARE_PROVIDER_SITE_OTHER): Payer: Medicare Other | Admitting: Internal Medicine

## 2015-11-01 ENCOUNTER — Encounter: Payer: Self-pay | Admitting: Internal Medicine

## 2015-11-01 VITALS — BP 118/58 | HR 70 | Ht 60.0 in | Wt 112.6 lb

## 2015-11-01 DIAGNOSIS — J432 Centrilobular emphysema: Secondary | ICD-10-CM | POA: Diagnosis not present

## 2015-11-01 DIAGNOSIS — J449 Chronic obstructive pulmonary disease, unspecified: Secondary | ICD-10-CM | POA: Diagnosis not present

## 2015-11-01 NOTE — Progress Notes (Signed)
Subjective:     Patient ID: [name], female   DOB: 21-Jul-1932, 82 y.o.   MRN: [id number]  HPI   OV 05/03/2015  COPD 2 with setting of moderate hiatal hernia    This is a routine follow-up. She is doing well. Most recently her inhaler got switched to [facility] because of cough. The powdered form of prior inhaler was removed by Dr. Melvyn Novas. After this she is doing well. Cough is improved. COPD stable. She tried to get into pulmonary rehabilitation but deferred due to cost of $80 per session. There are no new issues. COPD CAT score 14 and improved as below compared to aporil 2016.   OV 11/01/2015  Chief Complaint  Patient presents with  . Follow-up    [name], was then changed to [facility] and could not tolerate the powder, then was changed back to flovent is tolerating this well. Pt denies significant cough. Pt denies CP/tightness.      Follow-up Gold stage II COPD [May 2016 DLCO 6.1/34%, postbronchodilator FEV1 1.2 L/81% without broncho-dilator change] in the setting of moderate hiatal hernia. Alpha 1 mm 2013 Clear CT chest May 2016. [name] Before that it was [facility]. Her primary care physician WHITE,[name], MD switched her r to Flovent which she is taking 2 puff 2 times daily and she is feeling well. No interim COPD exacerbations or ear admissions. No new medical problems. She is feeling well. She is quite functional. In the past she has been intolerant to Spiriva powdered inhaler but she is willing to try the respimat    reports that she quit smoking about 43 years ago. Her smoking use included Cigarettes. She has a 30 pack-year smoking history. She has never used smokeless tobacco.   CAT COPD Symptom & Quality of Life Score (GSK trademark) 0 is no burden. 5 is highest burden 01/01/2015  05/03/2015    Never Cough -> Cough all the time 2 1  No phlegm in chest -> Chest is full of phlegm 3 1  No chest tightness -> Chest feels very tight 4 1  No dyspnea for 1 flight stairs/hill -> Very dyspneic for 1 flight of stairs 5 4  No limitations for ADL at home -> Very limited with ADL at home 3 2  Confident leaving home -> Not at all confident leaving home 3 2  Sleep soundly -> Do not sleep soundly because of lung condition 2 2  Lots of Energy -> No energy at all 2 1  TOTAL Score (max 40)  24 14    Current outpatient prescriptions:  .  acetaminophen (TYLENOL) 325 MG tablet, Take 650 mg by mouth as needed for pain. , Disp: , Rfl:  .  amLODipine (NORVASC) 10 MG tablet, Take 1 tablet (10 mg total) by mouth daily., Disp: 90 tablet, Rfl: 3 .  aspirin 81 MG tablet, Take 81 mg by mouth daily., Disp: , Rfl:  .  cholecalciferol (VITAMIN D) 1000 UNITS tablet, Take 1,000 Units by mouth daily., Disp: , Rfl:  .  fluticasone (FLOVENT HFA) 220 MCG/ACT inhaler, Inhale 2 puffs into the lungs 2 (two) times daily., Disp: , Rfl:  .  furosemide (LASIX) 20 MG tablet, Take 1 tablet (20 mg total) by mouth every other day., Disp: 90 tablet, Rfl: 3 .  HYDROcodone-homatropine (HYCODAN) 5-1.5 MG/5ML  syrup, Take 5 mLs by mouth every 6 (six) hours as needed for cough., Disp: 240 mL, Rfl: 0 .  irbesartan (AVAPRO) 150 MG tablet, Take 1 tablet (150 mg total) by mouth daily., Disp: 90 tablet, Rfl: 3 .  Magnesium 250 MG TABS, Take 1 tablet by mouth daily., Disp: , Rfl:  .  Melatonin-Pyridoxine (MELATONEX PO), Take 1 tablet by mouth as needed., Disp: , Rfl:  .  mometasone-formoterol (DULERA) 100-5 MCG/ACT AERO, Take 2 puffs first thing in am and then another 2 puffs about 12 hours later., Disp: 1 Inhaler, Rfl: 5 .  Multiple Vitamins-Minerals (OCUVITE-LUTEIN PO), Take 1 tablet by mouth every morning. , Disp: , Rfl:  .  pravastatin (PRAVACHOL) 40 MG tablet, Take 20 mg by mouth daily. , Disp: , Rfl:  .  PROAIR HFA 108 (90 BASE) MCG/ACT  inhaler, Inhale 2 puffs into the lungs 4 (four) times daily as needed., Disp: , Rfl:  .  sertraline (ZOLOFT) 50 MG tablet, Take 1 tablet (50 mg total) by mouth every morning., Disp: 30 tablet, Rfl: 1  Immunization History  Administered Date(s) Administered  . Influenza Split 07/19/2014  . Influenza,inj,Quad PF,36+ Mos 06/19/2015  . Pneumococcal Conjugate-13 09/18/2013  . Zoster 08/18/2014    Allergies  Allergen Reactions  . Amaryl [Glimepiride]     Side effects  . Avelox [Moxifloxacin Hcl In Nacl]     nausea  . Ciprofloxacin     nausea  . Lipitor [Atorvastatin]     myalgias  . Lisinopril     cough  . Prevacid [Lansoprazole]     diarrhea  . Spiriva Handihaler [Tiotropium Bromide Monohydrate]     Increased cough  . Symbicort [Budesonide-Formoterol Fumarate]     Increased cough     Review of Systems  Per hpi    Objective:   Physical Exam  Constitutional: She is oriented to person, place, and time. She appears well-developed and well-nourished. No distress.  HENT:  Head: Normocephalic and atraumatic.  Right Ear: External ear normal.  Left Ear: External ear normal.  Mouth/Throat: Oropharynx is clear and moist. No oropharyngeal exudate.  Eyes: Conjunctivae and EOM are normal. Pupils are equal, round, and reactive to light. Right eye exhibits no discharge. Left eye exhibits no discharge. No scleral icterus.  Neck: Normal range of motion. Neck supple. No JVD present. No tracheal deviation present. No thyromegaly present.  Cardiovascular: Normal rate, regular rhythm, normal heart sounds and intact distal pulses.  Exam reveals no gallop and no friction rub.   No murmur heard. Pulmonary/Chest: Effort normal and breath sounds normal. No respiratory distress. She has no wheezes. She has no rales. She exhibits no tenderness.  Scar in chest +  Abdominal: Soft. Bowel sounds are normal. She exhibits no distension and no mass. There is no tenderness. There is no rebound and no  guarding.  Musculoskeletal: Normal range of motion. She exhibits no edema or tenderness.  Lymphadenopathy:    She has no cervical adenopathy.  Neurological: She is alert and oriented to person, place, and time. She has normal reflexes. No cranial nerve deficit. She exhibits normal muscle tone. Coordination normal.  Skin: Skin is warm and dry. No rash noted. She is not diaphoretic. No erythema. No pallor.  Psychiatric: She has a normal mood and affect. Her behavior is normal. Judgment and thought content normal.  Vitals reviewed.   .     Assessment:       ICD-9-CM ICD-10-CM   1. COPD GOLD II  496  J44.9   2. Centrilobular emphysema (HCC) 492.8 J43.2        Plan:      COPD stable. We will see if he can manage your COPD without inhale steroids  Plan - Stop Flovent for a few weeks  - Try Spiriva in the respirmat format  - If you're tolerating the new form of Spiriva then please call us and we can do a prescription for that  Follow-up  - 6 months or sooner if needed  - Consider Pneumovax at follow-up if not done    Dr. Brand Males, M.D., [facility].C.P Pulmonary and Critical Care Medicine Staff Physician [facility] Pager: [phone], If no answer or between  15:00h - 7:00h: call [phone]  11/01/2015 2:00 PM

## 2015-11-01 NOTE — Patient Instructions (Addendum)
ICD-9-CM ICD-10-CM   1. COPD GOLD II  496 J44.9   2. Centrilobular emphysema (HCC) 492.8 J43.2     COPD stable. We will see if he can manage your COPD without inhale steroids  Plan - Stop Flovent for a few weeks  - Try Spiriva in the respirmat format  - If you're tolerating the new form of Spiriva then please call us and we can do a prescription for that  Follow-up  - 6 months or sooner if needed  - Consider Pneumovax at follow-up if not done

## 2015-11-09 ENCOUNTER — Ambulatory Visit (HOSPITAL_COMMUNITY)
Admission: RE | Admit: 2015-11-09 | Discharge: 2015-11-09 | Disposition: A | Discharge status: Home / Self Care | Payer: Medicare Other | Source: Ambulatory Visit | Attending: Cardiovascular Disease | Admitting: Cardiovascular Disease

## 2015-11-09 ENCOUNTER — Other Ambulatory Visit: Payer: Self-pay | Admitting: Cardiology

## 2015-11-09 ENCOUNTER — Other Ambulatory Visit: Payer: Self-pay

## 2015-11-09 ENCOUNTER — Ambulatory Visit (HOSPITAL_BASED_OUTPATIENT_CLINIC_OR_DEPARTMENT_OTHER): Payer: Medicare Other

## 2015-11-09 DIAGNOSIS — E119 Type 2 diabetes mellitus without complications: Secondary | ICD-10-CM | POA: Diagnosis not present

## 2015-11-09 DIAGNOSIS — R0989 Other specified symptoms and signs involving the circulatory and respiratory systems: Secondary | ICD-10-CM

## 2015-11-09 DIAGNOSIS — Z952 Presence of prosthetic heart valve: Secondary | ICD-10-CM

## 2015-11-09 DIAGNOSIS — E785 Hyperlipidemia, unspecified: Secondary | ICD-10-CM | POA: Diagnosis not present

## 2015-11-09 DIAGNOSIS — R938 Abnormal findings on diagnostic imaging of other specified body structures: Secondary | ICD-10-CM | POA: Insufficient documentation

## 2015-11-09 DIAGNOSIS — I6523 Occlusion and stenosis of bilateral carotid arteries: Secondary | ICD-10-CM

## 2015-11-09 DIAGNOSIS — I359 Nonrheumatic aortic valve disorder, unspecified: Secondary | ICD-10-CM | POA: Diagnosis not present

## 2015-11-09 DIAGNOSIS — I351 Nonrheumatic aortic (valve) insufficiency: Secondary | ICD-10-CM | POA: Insufficient documentation

## 2015-11-09 DIAGNOSIS — Z954 Presence of other heart-valve replacement: Secondary | ICD-10-CM

## 2015-11-09 DIAGNOSIS — I517 Cardiomegaly: Secondary | ICD-10-CM | POA: Insufficient documentation

## 2015-11-09 DIAGNOSIS — Z953 Presence of xenogenic heart valve: Secondary | ICD-10-CM | POA: Diagnosis not present

## 2015-11-09 DIAGNOSIS — I10 Essential (primary) hypertension: Secondary | ICD-10-CM | POA: Diagnosis not present

## 2015-11-18 ENCOUNTER — Inpatient Hospital Stay (HOSPITAL_COMMUNITY)
Admission: EM | Admit: 2015-11-18 | Discharge: 2015-11-23 | DRG: 871 | Disposition: A | Discharge status: Home / Self Care | Payer: Medicare Other | Attending: Internal Medicine | Admitting: Internal Medicine

## 2015-11-18 ENCOUNTER — Emergency Department (HOSPITAL_COMMUNITY): Payer: Medicare Other

## 2015-11-18 ENCOUNTER — Inpatient Hospital Stay (HOSPITAL_COMMUNITY): Payer: Medicare Other

## 2015-11-18 ENCOUNTER — Encounter (HOSPITAL_COMMUNITY): Payer: Self-pay | Admitting: Emergency Medicine

## 2015-11-18 DIAGNOSIS — K219 Gastro-esophageal reflux disease without esophagitis: Secondary | ICD-10-CM | POA: Diagnosis present

## 2015-11-18 DIAGNOSIS — J449 Chronic obstructive pulmonary disease, unspecified: Secondary | ICD-10-CM | POA: Diagnosis not present

## 2015-11-18 DIAGNOSIS — E1165 Type 2 diabetes mellitus with hyperglycemia: Secondary | ICD-10-CM | POA: Diagnosis present

## 2015-11-18 DIAGNOSIS — N179 Acute kidney failure, unspecified: Secondary | ICD-10-CM | POA: Diagnosis present

## 2015-11-18 DIAGNOSIS — I10 Essential (primary) hypertension: Secondary | ICD-10-CM | POA: Diagnosis present

## 2015-11-18 DIAGNOSIS — J441 Chronic obstructive pulmonary disease with (acute) exacerbation: Secondary | ICD-10-CM | POA: Diagnosis present

## 2015-11-18 DIAGNOSIS — Z952 Presence of prosthetic heart valve: Secondary | ICD-10-CM | POA: Diagnosis not present

## 2015-11-18 DIAGNOSIS — Z87891 Personal history of nicotine dependence: Secondary | ICD-10-CM | POA: Diagnosis not present

## 2015-11-18 DIAGNOSIS — A419 Sepsis, unspecified organism: Secondary | ICD-10-CM | POA: Diagnosis present

## 2015-11-18 DIAGNOSIS — Z9861 Coronary angioplasty status: Secondary | ICD-10-CM

## 2015-11-18 DIAGNOSIS — T380X5A Adverse effect of glucocorticoids and synthetic analogues, initial encounter: Secondary | ICD-10-CM | POA: Diagnosis present

## 2015-11-18 DIAGNOSIS — J9601 Acute respiratory failure with hypoxia: Secondary | ICD-10-CM | POA: Diagnosis present

## 2015-11-18 DIAGNOSIS — Z951 Presence of aortocoronary bypass graft: Secondary | ICD-10-CM | POA: Diagnosis not present

## 2015-11-18 DIAGNOSIS — Z79899 Other long term (current) drug therapy: Secondary | ICD-10-CM | POA: Diagnosis not present

## 2015-11-18 DIAGNOSIS — Z7984 Long term (current) use of oral hypoglycemic drugs: Secondary | ICD-10-CM

## 2015-11-18 DIAGNOSIS — J189 Pneumonia, unspecified organism: Secondary | ICD-10-CM | POA: Diagnosis present

## 2015-11-18 DIAGNOSIS — R0600 Dyspnea, unspecified: Secondary | ICD-10-CM

## 2015-11-18 DIAGNOSIS — R739 Hyperglycemia, unspecified: Secondary | ICD-10-CM | POA: Diagnosis not present

## 2015-11-18 DIAGNOSIS — J440 Chronic obstructive pulmonary disease with acute lower respiratory infection: Secondary | ICD-10-CM | POA: Diagnosis present

## 2015-11-18 DIAGNOSIS — R0902 Hypoxemia: Secondary | ICD-10-CM

## 2015-11-18 LAB — CBC WITH DIFFERENTIAL/PLATELET
BASOS ABS: 0 10*3/uL (ref 0.0–0.1)
BASOS PCT: 0 %
EOS ABS: 0 10*3/uL (ref 0.0–0.7)
Eosinophils Relative: 0 %
HEMATOCRIT: 36.6 % (ref 36.0–46.0)
HEMOGLOBIN: 11.1 g/dL — AB (ref 12.0–15.0)
Lymphocytes Relative: 3 %
Lymphs Abs: 0.6 10*3/uL — ABNORMAL LOW (ref 0.7–4.0)
MCH: 25.1 pg — ABNORMAL LOW (ref 26.0–34.0)
MCHC: 30.3 g/dL (ref 30.0–36.0)
MCV: 82.6 fL (ref 78.0–100.0)
Monocytes Absolute: 0.9 10*3/uL (ref 0.1–1.0)
Monocytes Relative: 4 %
NEUTROS ABS: 20.3 10*3/uL — AB (ref 1.7–7.7)
NEUTROS PCT: 93 %
PLATELETS: 311 10*3/uL (ref 150–400)
RBC: 4.43 MIL/uL (ref 3.87–5.11)
RDW: 16.3 % — AB (ref 11.5–15.5)
WBC: 21.7 10*3/uL — AB (ref 4.0–10.5)

## 2015-11-18 LAB — I-STAT TROPONIN, ED: Troponin i, poc: 0.01 ng/mL (ref 0.00–0.08)

## 2015-11-18 LAB — CBC
HEMATOCRIT: 42.9 % (ref 36.0–46.0)
Hemoglobin: 12.8 g/dL (ref 12.0–15.0)
MCH: 24.9 pg — AB (ref 26.0–34.0)
MCHC: 29.8 g/dL — ABNORMAL LOW (ref 30.0–36.0)
MCV: 83.3 fL (ref 78.0–100.0)
Platelets: 332 10*3/uL (ref 150–400)
RBC: 5.15 MIL/uL — ABNORMAL HIGH (ref 3.87–5.11)
RDW: 16.3 % — ABNORMAL HIGH (ref 11.5–15.5)
WBC: 23.9 10*3/uL — ABNORMAL HIGH (ref 4.0–10.5)

## 2015-11-18 LAB — BASIC METABOLIC PANEL
ANION GAP: 14 (ref 5–15)
BUN: 38 mg/dL — ABNORMAL HIGH (ref 6–20)
CALCIUM: 9.7 mg/dL (ref 8.9–10.3)
CO2: 21 mmol/L — AB (ref 22–32)
Chloride: 105 mmol/L (ref 101–111)
Creatinine, Ser: 1.46 mg/dL — ABNORMAL HIGH (ref 0.44–1.00)
GFR calc non Af Amer: 33 mL/min — ABNORMAL LOW (ref >60)
GFR, EST AFRICAN AMERICAN: 38 mL/min — AB (ref >60)
Glucose, Bld: 277 mg/dL — ABNORMAL HIGH (ref 65–99)
POTASSIUM: 4.4 mmol/L (ref 3.5–5.1)
Sodium: 140 mmol/L (ref 135–145)

## 2015-11-18 LAB — APTT: APTT: 32 s (ref 24–37)

## 2015-11-18 LAB — BLOOD GAS, ARTERIAL
ACID-BASE DEFICIT: 9.5 mmol/L — AB (ref 0.0–2.0)
BICARBONATE: 14.1 meq/L — AB (ref 20.0–24.0)
Drawn by: 422461
O2 CONTENT: 4 L/min
O2 SAT: 92.9 %
PATIENT TEMPERATURE: 98.1
PCO2 ART: 24.4 mmHg — AB (ref 35.0–45.0)
PO2 ART: 72 mmHg — AB (ref 80.0–100.0)
TCO2: 13.1 mmol/L (ref 0–100)
pH, Arterial: 7.376 (ref 7.350–7.450)

## 2015-11-18 LAB — I-STAT CG4 LACTIC ACID, ED
LACTIC ACID, VENOUS: 6.81 mmol/L — AB (ref 0.5–2.0)
Lactic Acid, Venous: 2.94 mmol/L (ref 0.5–2.0)

## 2015-11-18 LAB — INFLUENZA PANEL BY PCR (TYPE A & B)
H1N1FLUPCR: NOT DETECTED
Influenza A By PCR: NEGATIVE
Influenza B By PCR: NEGATIVE

## 2015-11-18 LAB — LACTIC ACID, PLASMA: LACTIC ACID, VENOUS: 4.4 mmol/L — AB (ref 0.5–2.0)

## 2015-11-18 LAB — PROTIME-INR
INR: 1.2 (ref 0.00–1.49)
Prothrombin Time: 15.3 seconds — ABNORMAL HIGH (ref 11.6–15.2)

## 2015-11-18 LAB — BRAIN NATRIURETIC PEPTIDE: B Natriuretic Peptide: 248.3 pg/mL — ABNORMAL HIGH (ref 0.0–100.0)

## 2015-11-18 LAB — PROCALCITONIN: Procalcitonin: 0.38 ng/mL

## 2015-11-18 MED ORDER — IRBESARTAN 150 MG PO TABS
150.0000 mg | ORAL_TABLET | Freq: Every day | ORAL | Status: DC
  Administered 2015-11-19 – 2015-11-23 (×5): 150 mg via ORAL
  Filled 2015-11-18 (×5): qty 1

## 2015-11-18 MED ORDER — SODIUM CHLORIDE 0.9 % IV BOLUS (SEPSIS): 250.0000 mL | Freq: Once | INTRAVENOUS | Status: AC

## 2015-11-18 MED ORDER — ONDANSETRON HCL 4 MG/2ML IJ SOLN
4.0000 mg | Freq: Four times a day (QID) | INTRAMUSCULAR | Status: DC | PRN
  Administered 2015-11-22: 4 mg via INTRAVENOUS
  Filled 2015-11-18: qty 2

## 2015-11-18 MED ORDER — IPRATROPIUM BROMIDE 0.02 % IN SOLN: 0.5000 mg | Freq: Four times a day (QID) | RESPIRATORY_TRACT | Status: DC

## 2015-11-18 MED ORDER — ONDANSETRON HCL 4 MG PO TABS: 4.0000 mg | ORAL_TABLET | Freq: Four times a day (QID) | ORAL | Status: DC | PRN

## 2015-11-18 MED ORDER — METHYLPREDNISOLONE SODIUM SUCC 125 MG IJ SOLR
125.0000 mg | Freq: Once | INTRAMUSCULAR | Status: AC
  Administered 2015-11-18: 125 mg via INTRAVENOUS
  Filled 2015-11-18: qty 2

## 2015-11-18 MED ORDER — PRAVASTATIN SODIUM 20 MG PO TABS
20.0000 mg | ORAL_TABLET | Freq: Every day | ORAL | Status: DC
  Administered 2015-11-19 – 2015-11-23 (×5): 20 mg via ORAL
  Filled 2015-11-18 (×5): qty 1

## 2015-11-18 MED ORDER — IPRATROPIUM-ALBUTEROL 0.5-2.5 (3) MG/3ML IN SOLN: 3.0000 mL | Freq: Three times a day (TID) | RESPIRATORY_TRACT | Status: DC

## 2015-11-18 MED ORDER — DEXTROSE 5 % IV SOLN: 1.0000 g | INTRAVENOUS | Status: DC

## 2015-11-18 MED ORDER — VANCOMYCIN HCL 500 MG IV SOLR: 500.0000 mg | INTRAVENOUS | Status: DC

## 2015-11-18 MED ORDER — GUAIFENESIN ER 600 MG PO TB12
600.0000 mg | ORAL_TABLET | Freq: Two times a day (BID) | ORAL | Status: DC
  Administered 2015-11-18 – 2015-11-23 (×10): 600 mg via ORAL
  Filled 2015-11-18 (×11): qty 1

## 2015-11-18 MED ORDER — AMLODIPINE BESYLATE 10 MG PO TABS
10.0000 mg | ORAL_TABLET | Freq: Every day | ORAL | Status: DC
  Administered 2015-11-19 – 2015-11-23 (×5): 10 mg via ORAL
  Filled 2015-11-18 (×5): qty 1

## 2015-11-18 MED ORDER — ENOXAPARIN SODIUM 40 MG/0.4ML ~~LOC~~ SOLN
40.0000 mg | SUBCUTANEOUS | Status: DC
  Administered 2015-11-18: 40 mg via SUBCUTANEOUS
  Filled 2015-11-18: qty 0.4

## 2015-11-18 MED ORDER — FLUTICASONE PROPIONATE HFA 220 MCG/ACT IN AERO: 2.0000 | INHALATION_SPRAY | Freq: Two times a day (BID) | RESPIRATORY_TRACT | Status: DC

## 2015-11-18 MED ORDER — ACETAMINOPHEN 325 MG PO TABS
650.0000 mg | ORAL_TABLET | Freq: Four times a day (QID) | ORAL | Status: DC | PRN
  Administered 2015-11-18 – 2015-11-22 (×6): 650 mg via ORAL
  Filled 2015-11-18 (×7): qty 2

## 2015-11-18 MED ORDER — PIPERACILLIN-TAZOBACTAM 3.375 G IVPB
3.3750 g | Freq: Three times a day (TID) | INTRAVENOUS | Status: DC
  Administered 2015-11-18 – 2015-11-19 (×2): 3.375 g via INTRAVENOUS
  Filled 2015-11-18 (×2): qty 50

## 2015-11-18 MED ORDER — ASPIRIN EC 81 MG PO TBEC
81.0000 mg | DELAYED_RELEASE_TABLET | Freq: Every day | ORAL | Status: DC
  Administered 2015-11-19 – 2015-11-23 (×5): 81 mg via ORAL
  Filled 2015-11-18 (×5): qty 1

## 2015-11-18 MED ORDER — ALBUTEROL (5 MG/ML) CONTINUOUS INHALATION SOLN
10.0000 mg/h | INHALATION_SOLUTION | Freq: Once | RESPIRATORY_TRACT | Status: AC
  Administered 2015-11-18: 10 mg/h via RESPIRATORY_TRACT
  Filled 2015-11-18: qty 20

## 2015-11-18 MED ORDER — DEXTROSE 5 % IV SOLN: 500.0000 mg | INTRAVENOUS | Status: DC

## 2015-11-18 MED ORDER — SERTRALINE HCL 50 MG PO TABS
50.0000 mg | ORAL_TABLET | Freq: Every morning | ORAL | Status: DC
  Administered 2015-11-19 – 2015-11-23 (×5): 50 mg via ORAL
  Filled 2015-11-18 (×5): qty 1

## 2015-11-18 MED ORDER — GUAIFENESIN-CODEINE 100-10 MG/5ML PO SOLN
5.0000 mL | Freq: Once | ORAL | Status: AC
  Administered 2015-11-18: 5 mL via ORAL
  Filled 2015-11-18: qty 5

## 2015-11-18 MED ORDER — SODIUM CHLORIDE 0.9% FLUSH
3.0000 mL | Freq: Two times a day (BID) | INTRAVENOUS | Status: DC
  Administered 2015-11-18 – 2015-11-22 (×8): 3 mL via INTRAVENOUS

## 2015-11-18 MED ORDER — BUDESONIDE 0.5 MG/2ML IN SUSP
1.0000 mg | Freq: Two times a day (BID) | RESPIRATORY_TRACT | Status: DC
  Administered 2015-11-18 – 2015-11-19 (×2): 1 mg via RESPIRATORY_TRACT
  Administered 2015-11-19: 0.5 mg via RESPIRATORY_TRACT
  Administered 2015-11-20: 1 mg via RESPIRATORY_TRACT
  Filled 2015-11-18 (×4): qty 4

## 2015-11-18 MED ORDER — DEXTROSE 5 % IV SOLN
1.0000 g | Freq: Once | INTRAVENOUS | Status: AC
  Administered 2015-11-18: 1 g via INTRAVENOUS
  Filled 2015-11-18: qty 10

## 2015-11-18 MED ORDER — DEXTROSE 5 % IV SOLN
500.0000 mg | Freq: Once | INTRAVENOUS | Status: AC
  Administered 2015-11-18: 500 mg via INTRAVENOUS
  Filled 2015-11-18: qty 500

## 2015-11-18 MED ORDER — METHYLPREDNISOLONE SODIUM SUCC 125 MG IJ SOLR
60.0000 mg | Freq: Four times a day (QID) | INTRAMUSCULAR | Status: DC
  Administered 2015-11-18 – 2015-11-19 (×3): 60 mg via INTRAVENOUS
  Filled 2015-11-18 (×3): qty 2

## 2015-11-18 MED ORDER — IPRATROPIUM-ALBUTEROL 0.5-2.5 (3) MG/3ML IN SOLN
3.0000 mL | Freq: Four times a day (QID) | RESPIRATORY_TRACT | Status: DC
  Administered 2015-11-18: 3 mL via RESPIRATORY_TRACT
  Filled 2015-11-18: qty 3

## 2015-11-18 MED ORDER — GUAIFENESIN-DM 100-10 MG/5ML PO SYRP
5.0000 mL | ORAL_SOLUTION | ORAL | Status: DC | PRN
  Administered 2015-11-20 – 2015-11-23 (×5): 5 mL via ORAL
  Filled 2015-11-18 (×5): qty 10

## 2015-11-18 MED ORDER — ALBUTEROL SULFATE (2.5 MG/3ML) 0.083% IN NEBU: 2.5000 mg | INHALATION_SOLUTION | RESPIRATORY_TRACT | Status: DC | PRN

## 2015-11-18 MED ORDER — VANCOMYCIN HCL 500 MG IV SOLR
500.0000 mg | INTRAVENOUS | Status: AC
  Administered 2015-11-18: 500 mg via INTRAVENOUS
  Filled 2015-11-18: qty 500

## 2015-11-18 MED ORDER — ALBUTEROL SULFATE (2.5 MG/3ML) 0.083% IN NEBU
INHALATION_SOLUTION | RESPIRATORY_TRACT | Status: AC
  Filled 2015-11-18: qty 6

## 2015-11-18 MED ORDER — ALBUTEROL SULFATE (2.5 MG/3ML) 0.083% IN NEBU: 2.5000 mg | INHALATION_SOLUTION | Freq: Four times a day (QID) | RESPIRATORY_TRACT | Status: DC

## 2015-11-18 MED ORDER — SODIUM CHLORIDE 0.9 % IV SOLN
INTRAVENOUS | Status: AC
  Administered 2015-11-18: 18:00:00 via INTRAVENOUS

## 2015-11-18 MED ORDER — DEXTROSE 5 % IV SOLN
500.0000 mg | INTRAVENOUS | Status: DC
  Administered 2015-11-19 – 2015-11-23 (×5): 500 mg via INTRAVENOUS
  Filled 2015-11-18 (×6): qty 500

## 2015-11-18 MED ORDER — SODIUM CHLORIDE 0.9 % IV BOLUS (SEPSIS)
500.0000 mL | Freq: Once | INTRAVENOUS | Status: AC
  Administered 2015-11-18: 500 mL via INTRAVENOUS

## 2015-11-18 NOTE — ED Notes (Addendum)
Pt with Hx of COPD c/o emesis, abdominal pain, SOB, pain to right scapular area. Pt states she has hiatal hernia. Crackles present throughout lower lobes.

## 2015-11-18 NOTE — Progress Notes (Signed)
Pharmacy Antibiotic Note  [name] is a 82 y.o. female admitted on 11/18/2015 with sepsis.  Pharmacy has been consulted for vancomycin and zosyn dosing.  Plan: Vancomycin '500mg'$  IV every 24 hours.  Goal trough 15-20 mcg/mL.  Follow up renal function & cultures Zosyn 3.375gm IV q8h (4hr extended infusions) Follow up renal function & cultures  Height: 5' (152.4 cm) Weight: 107 lb (48.535 kg) IBW/kg (Calculated) : 45.5  Temp (24hrs), Avg:98.3 F (36.8 C), Min:98 F (36.7 C), Max:98.9 F (37.2 C)   Recent Labs Lab 11/18/15 1217 11/18/15 1227 11/18/15 1648 11/18/15 1650 11/18/15 1950  WBC 23.9*  --  21.7*  --   --   CREATININE 1.46*  --   --   --   --   LATICACIDVEN  --  2.94*  --  4.4* 6.81*    Estimated Creatinine Clearance: 21.7 mL/min (by C-G formula based on Cr of 1.46).    Allergies  Allergen Reactions  . Amaryl [Glimepiride]     Side effects  . Avelox [Moxifloxacin Hcl In Nacl]     nausea  . Ciprofloxacin     nausea  . Lipitor [Atorvastatin]     myalgias  . Lisinopril     cough  . Prevacid [Lansoprazole]     diarrhea  . Spiriva Handihaler [Tiotropium Bromide Monohydrate]     Increased cough  . Symbicort [Budesonide-Formoterol Fumarate]     Increased cough    Antimicrobials this admission: 2/3 Rocephin/Zithro x 1 2/3 >> Vanc >> 2/3 >> Zosyn >>  Dose adjustments this admission: ---  Microbiology results: 3/2 BCx: sent 3/2 Respiratory virus panel: sent 3/2 Sputum: ordered   Thank you for allowing pharmacy to be a part of this patient's care.  Peggyann Juba, PharmD, BCPS Pager: [phone] 11/18/2015 8:25 PM

## 2015-11-18 NOTE — ED Notes (Signed)
Hospitalist made aware of pt's lactic acid.  Request to be made for step-down bed.

## 2015-11-18 NOTE — Progress Notes (Signed)
PHARMACY NOTE -  Ceftriaxone and Azithromycin  Pharmacy has been asked to assist with dosing of ceftriaxone and azithromycin for CAP.  Plan:  Ceftriaxone 1g IV q24h x 7 days  Azithromycin '500mg'$  IV q24h x 7 days  Need for further dosage adjustment appears unlikely at present.    Will sign off at this time.  Please reconsult if a change in clinical status warrants re-evaluation of dosage.  Peggyann Juba, PharmD, BCPS Pager: [phone] 11/18/2015 4:25 PM

## 2015-11-18 NOTE — H&P (Signed)
[facility] History and Physical  [name] FBP:[id number] DOB: 09-09-1932 DOA: 11/18/2015  Referring physician: ED physician, Dr. Wilson Singer  PCP: [name], MD   Chief Complaint: dyspnea   HPI:  Pt is 82 yo female with known COPD, aortic stenosis and s/p AVR, HTN, presents to [facility] ED with main concern of several days duration of progressive dyspnea that initially started with exertion and has progressed to dyspnea at rest in the past 24 hours, associated with mixed episodes of non productive and productive cough of clear and yellow sputum, chest tightness that is present with coughing spells, malaise, nausea, poor oral intake, several episodes of non bloody vomiting. Pt denies fevers, chills, recent sick contacts or exposures, no abd or urinary concerns.  In ED, pt noted to be hemodynamically stable, VS notable for HR up to 118, RR up to 28, oxygen saturation 81% on RA requiring placement on 6 L oxygen via [place]. Blood work notable for WBC 23 K, Cr 1.46, Cr 2.94. CXR with stable emphysema. TRH asked to admit for further evaluation.   Assessment and Plan:  Principal Problem:   COPD exacerbation ([place]), GOLD II - pt currently stable on 2 L oxygen via [place] but quickly desaturates when trying to wean - admit to telemetry unit - place on BD's scheduled and as needed, solumedrol 60 mg IV Q6 hours, empiric ABX  - taper down BD, solumedrol as clinically indicated   Active Problems:   Sepsis ([place]) - unclear if some underlying PNA but pt meets criteria given HR > 90, RR > 22, WBC 23 K, source suspected pulmonary bronchitis vs PNA - will proceed with CT chest for clearer evaluation - per physical exam, some dullness to percussion in the lower lobes so would not be surprised if some early PNA developing  - sepsis order set in place, follow up on lactic acid, procalcitonin, blood and sputum cultures     Acute kidney injury ([place]) - will give small bolus of IVF - hold Lasix today  - pt also  on ARB Avapro at home so OK to continue for now but if Cr continues trending up despite IVF, consider holding Avapro  - BMP In AM    Essential hypertension - continue Norvasc and Avapro     Hyperglycemia - ? Steroid induced, will check A1C  Lovenox SQ for DVT prophylaxis    Radiological Exams on Admission: Dg Chest 2 View 11/18/2015  Stable emphysematous disease seen in both upper lobes with stable mild bibasilar scarring.  Code Status: Full Family Communication: Pt at bedside Disposition Plan: Admit for further evaluation    [facility] [phone]   Review of Systems:  Constitutional: N Negative for diaphoresis.  HENT: Negative for hearing loss, neck pain, tinnitus and ear discharge.   Eyes: Negative for blurred vision, double vision, photophobia, pain, discharge and redness.  Respiratory: Negative for hemoptysis and stridor.   Cardiovascular: Negative for claudication and leg swelling.  Gastrointestinal: Negative for heartburn, constipation, blood in stool and melena.  Genitourinary: Negative for dysuria, urgency, frequency, hematuria and flank pain.  Musculoskeletal: Negative for myalgias, back pain, joint pain and falls.  Skin: Negative for itching and rash.  Neurological: Negative for dizziness and weakness.  Endo/Heme/Allergies: Negative for environmental allergies and polydipsia. Does not bruise/bleed easily.  Psychiatric/Behavioral: Negative for suicidal ideas. The patient is not nervous/anxious.    Past Medical History  Diagnosis Date  . Hypertension   . Asthma     uses inhaler as  needed  . Diabetes mellitus   . GERD (gastroesophageal reflux disease)   . Arthritis   . Anxiety   . Depression   . Hyperlipidemia   . Emphysema of lung ([place])   . Glaucoma   . [name]   . Aortic stenosis   . Murmur   . Nephrolithiasis     Past Surgical History  Procedure Laterality Date  . Ankle surgery  1998    d/t fx.  Left ankle  . Facial  cosmetic surgery      face lift  . Nose surgery    . Laparoscopic assisted vaginal hysterectomy  06/21/2011    Procedure: LAPAROSCOPIC ASSISTED VAGINAL HYSTERECTOMY;  Surgeon: Maisie Fus;  Location: [facility];  Service: Gynecology;  Laterality: N/A;  . Salpingoophorectomy  06/21/2011    Procedure: SALPINGO OOPHERECTOMY;  Surgeon: Maisie Fus;  Location: [facility];  Service: Gynecology;  Laterality: Bilateral;  . Anterior and posterior repair  06/21/2011    Procedure: ANTERIOR (CYSTOCELE) AND POSTERIOR REPAIR (RECTOCELE);  Surgeon: Maisie Fus;  Location: [facility];  Service: Gynecology;  Laterality: N/A;  . Vaginal prolapse repair  06/21/2011    Procedure: SACROPEXY;  Surgeon: Maisie Fus;  Location: [facility];  Service: Gynecology;  Laterality: N/A;  sacrospinous ligament suspension  . Cardiac catheterization    . Aortic valve replacement N/A 07/17/2013    Procedure: AORTIC VALVE REPLACEMENT (AVR);  Surgeon: Ivin Poot, MD;  Location: [place];  Service: Open Heart Surgery;  Laterality: N/A;  . Coronary artery bypass graft N/A 07/17/2013    Procedure: CORONARY ARTERY BYPASS GRAFT, TIMES ONE, ON PUMP, USING RIGHT INTERNAL MAMMARY ARTERY.;  Surgeon: Ivin Poot, MD;  Location: [place];  Service: Open Heart Surgery;  Laterality: N/A;  . Intraoperative transesophageal echocardiogram N/A 07/17/2013    Procedure: INTRAOPERATIVE TRANSESOPHAGEAL ECHOCARDIOGRAM;  Surgeon: Ivin Poot, MD;  Location: [place];  Service: Open Heart Surgery;  Laterality: N/A;  . Abdominal hysterectomy    . Cardiac valve replacement    . Coronary angioplasty    . Left and right heart catheterization with coronary angiogram N/A 06/27/2013    Procedure: LEFT AND RIGHT HEART CATHETERIZATION WITH CORONARY ANGIOGRAM;  Surgeon: Ramond Dial, MD;  Location: [facility] CATH [facility];  Service: Cardiovascular;  Laterality: N/A;    Social History:  reports that she quit smoking about 43 years ago. Her smoking use included Cigarettes. She  has a 30 pack-year smoking history. She has never used smokeless tobacco. She reports that she does not drink alcohol or use illicit drugs.  Allergies  Allergen Reactions  . Amaryl [Glimepiride]     Side effects  . Avelox [Moxifloxacin Hcl In Nacl]     nausea  . Ciprofloxacin     nausea  . Lipitor [Atorvastatin]     myalgias  . Lisinopril     cough  . Prevacid [Lansoprazole]     diarrhea  . Spiriva Handihaler [Tiotropium Bromide Monohydrate]     Increased cough  . Symbicort [Budesonide-Formoterol Fumarate]     Increased cough    Family History  Problem Relation Age of Onset  . Emphysema Father   . Colon cancer Neg Hx   . Esophageal cancer Neg Hx   . Rectal cancer Neg Hx   . Stomach cancer Neg Hx     Medication Sig  acetaminophen (TYLENOL) 325 MG tablet Take 650 mg by mouth every 6 (six) hours as needed for moderate pain or  headache.   amLODipine (NORVASC) 10 MG tablet Take 1 tablet (10 mg total) by mouth daily.  aspirin 81 MG tablet Take 81 mg by mouth daily.   fluticasone (FLOVENT HFA) 220 MCG/ACT inhaler Inhale 2 puffs into the lungs 2 (two) times daily.  furosemide (LASIX) 20 MG tablet Take 1 tablet (20 mg total) by mouth every other day.  irbesartan (AVAPRO) 150 MG tablet Take 1 tablet (150 mg total) by mouth daily.  pravastatin (PRAVACHOL) 20 MG tablet Take 20 mg by mouth daily.  PROAIR HFA 108 (90 BASE) MCG/ACT inhaler Inhale 2 puffs into the lungs 4 (four) times daily as needed for wheezing or shortness of breath.   sertraline (ZOLOFT) 50 MG tablet Take 1 tablet (50 mg total) by mouth every morning.  HYDROcodone-homatropine (HYCODAN) 5-1.5 MG/5ML syrup Take 5 mLs by mouth every 6 (six) hours as needed for cough. Patient not taking: Reported on 11/18/2015  mometasone-formoterol (DULERA) 100-5 MCG/ACT AERO Take 2 puffs first thing in am and then another 2 puffs about 12 hours later. Patient not taking: Reported on 11/18/2015   Physical Exam: Filed Vitals:   11/18/15  1150 11/18/15 1213 11/18/15 1312 11/18/15 1320  BP:  158/76 155/70   Pulse:  106 94   Temp:   98 F (36.7 C)   TempSrc:   Oral   Resp:  28 20   SpO2: 93% 94% 96% 97%    Physical Exam  Constitutional: Appears well-developed and well-nourished. No distress.  HENT: Normocephalic. External right and left ear normal. Oropharynx is clear and moist.  Eyes: Conjunctivae and EOM are normal. PERRLA, no scleral icterus.  Neck: Normal ROM. Neck supple. No JVD. No tracheal deviation. No thyromegaly.  CVS: RRR, S1/S2 +, no murmurs, no gallops, no carotid bruit.  Pulmonary: Course breath sounds with exp wheezing, dullness to percussion in bilateral lower lobes  Abdominal: Soft. BS +,  no distension, tenderness, rebound or guarding.  Musculoskeletal: Normal range of motion. No edema and no tenderness.  Lymphadenopathy: No lymphadenopathy noted, cervical, inguinal. Neuro: Alert. Normal reflexes, muscle tone coordination. No cranial nerve deficit. Skin: Skin is warm and dry. No rash noted. Not diaphoretic. No erythema. No pallor.  Psychiatric: Normal mood and affect. Behavior, judgment, thought content normal.   Labs on Admission:  Basic Metabolic Panel:  Recent Labs Lab 11/18/15 1217  NA 140  K 4.4  CL 105  CO2 21*  GLUCOSE 277*  BUN 38*  CREATININE 1.46*  CALCIUM 9.7   CBC:  Recent Labs Lab 11/18/15 1217  WBC 23.9*  HGB 12.8  HCT 42.9  MCV 83.3  PLT 332   EKG: pending   If 7PM-7AM, please contact night-coverage [email or web address] Password [email or web address] 11/18/2015, 2:13 PM

## 2015-11-18 NOTE — ED Notes (Signed)
Gave critical I stat result to MD Wilson Singer

## 2015-11-19 DIAGNOSIS — I10 Essential (primary) hypertension: Secondary | ICD-10-CM

## 2015-11-19 DIAGNOSIS — N179 Acute kidney failure, unspecified: Secondary | ICD-10-CM

## 2015-11-19 DIAGNOSIS — J189 Pneumonia, unspecified organism: Secondary | ICD-10-CM

## 2015-11-19 DIAGNOSIS — J441 Chronic obstructive pulmonary disease with (acute) exacerbation: Secondary | ICD-10-CM

## 2015-11-19 DIAGNOSIS — R739 Hyperglycemia, unspecified: Secondary | ICD-10-CM

## 2015-11-19 LAB — RESPIRATORY VIRUS PANEL
Adenovirus: NEGATIVE
Influenza A: NEGATIVE
Influenza B: NEGATIVE
Metapneumovirus: NEGATIVE
PARAINFLUENZA 2 A: NEGATIVE
PARAINFLUENZA 3 A: NEGATIVE
Parainfluenza 1: NEGATIVE
RESPIRATORY SYNCYTIAL VIRUS B: NEGATIVE
RHINOVIRUS: NEGATIVE
Respiratory Syncytial Virus A: NEGATIVE

## 2015-11-19 LAB — LACTIC ACID, PLASMA
Lactic Acid, Venous: 1.4 mmol/L (ref 0.5–2.0)
Lactic Acid, Venous: 2.7 mmol/L (ref 0.5–2.0)

## 2015-11-19 LAB — LEGIONELLA ANTIGEN, URINE

## 2015-11-19 LAB — STREP PNEUMONIAE URINARY ANTIGEN: Strep Pneumo Urinary Antigen: POSITIVE — AB

## 2015-11-19 LAB — GLUCOSE, CAPILLARY
Glucose-Capillary: 459 mg/dL — ABNORMAL HIGH (ref 65–99)
Glucose-Capillary: 104 mg/dL — ABNORMAL HIGH (ref 65–99)

## 2015-11-19 LAB — HEMOGLOBIN A1C
HEMOGLOBIN A1C: 8.2 % — AB (ref 4.8–5.6)
Mean Plasma Glucose: 189 mg/dL

## 2015-11-19 LAB — MRSA PCR SCREENING: MRSA by PCR: NEGATIVE

## 2015-11-19 MED ORDER — INSULIN ASPART 100 UNIT/ML ~~LOC~~ SOLN: 0.0000 [IU] | Freq: Three times a day (TID) | SUBCUTANEOUS | Status: DC

## 2015-11-19 MED ORDER — IPRATROPIUM-ALBUTEROL 0.5-2.5 (3) MG/3ML IN SOLN
3.0000 mL | Freq: Three times a day (TID) | RESPIRATORY_TRACT | Status: DC
Start: 2015-11-19 — End: 2015-11-23
  Administered 2015-11-19 – 2015-11-23 (×12): 3 mL via RESPIRATORY_TRACT
  Filled 2015-11-19 (×14): qty 3

## 2015-11-19 MED ORDER — DEXTROSE 5 % IV SOLN
1.0000 g | INTRAVENOUS | Status: DC
  Administered 2015-11-20 – 2015-11-23 (×4): 1 g via INTRAVENOUS
  Filled 2015-11-19 (×4): qty 10

## 2015-11-19 MED ORDER — INSULIN GLARGINE 100 UNIT/ML ~~LOC~~ SOLN
6.0000 [IU] | Freq: Every day | SUBCUTANEOUS | Status: DC
Start: 2015-11-19 — End: 2015-11-23
  Administered 2015-11-20 – 2015-11-23 (×4): 6 [IU] via SUBCUTANEOUS
  Filled 2015-11-19 (×5): qty 0.06

## 2015-11-19 MED ORDER — ENOXAPARIN SODIUM 30 MG/0.3ML ~~LOC~~ SOLN
30.0000 mg | SUBCUTANEOUS | Status: DC
  Administered 2015-11-19 – 2015-11-22 (×4): 30 mg via SUBCUTANEOUS
  Filled 2015-11-19 (×5): qty 0.3

## 2015-11-19 MED ORDER — METHYLPREDNISOLONE SODIUM SUCC 125 MG IJ SOLR
60.0000 mg | Freq: Three times a day (TID) | INTRAMUSCULAR | Status: DC
  Administered 2015-11-19 – 2015-11-21 (×7): 60 mg via INTRAVENOUS
  Filled 2015-11-19: qty 2
  Filled 2015-11-19 (×8): qty 0.96

## 2015-11-19 MED ORDER — METFORMIN HCL 500 MG PO TABS
500.0000 mg | ORAL_TABLET | Freq: Every day | ORAL | Status: DC
  Administered 2015-11-19 – 2015-11-21 (×3): 500 mg via ORAL
  Filled 2015-11-19 (×4): qty 1

## 2015-11-19 MED ORDER — INSULIN ASPART 100 UNIT/ML ~~LOC~~ SOLN
0.0000 [IU] | Freq: Three times a day (TID) | SUBCUTANEOUS | Status: DC
  Administered 2015-11-19: 15 [IU] via SUBCUTANEOUS
  Administered 2015-11-20 (×2): 5 [IU] via SUBCUTANEOUS
  Administered 2015-11-20: 2 [IU] via SUBCUTANEOUS
  Administered 2015-11-21: 11 [IU] via SUBCUTANEOUS
  Administered 2015-11-21 – 2015-11-22 (×3): 5 [IU] via SUBCUTANEOUS
  Administered 2015-11-22: 3 [IU] via SUBCUTANEOUS
  Administered 2015-11-22: 5 [IU] via SUBCUTANEOUS
  Administered 2015-11-23: 2 [IU] via SUBCUTANEOUS

## 2015-11-19 NOTE — Evaluation (Signed)
Physical Therapy Evaluation Patient Details Name: [name] MRN: [id number] DOB: 1932/04/07 Today's Date: 11/19/2015   History of Present Illness  82 y.o. female with h/o DM, glaucome, COPD, AVR, HTN admitted with dyspnea, cough. Dx of COPD exacerbation, bronchitis, AKI, sepsis.   Clinical Impression  Pt admitted with above diagnosis. Pt currently with functional limitations due to the deficits listed below (see PT Problem List). Pt ambulated 52' without an assistive device, SaO2 82% on RA at rest, 93% on 6L O2 with walking. Outpatient cardiopulmonary rehab recommended.   Pt will benefit from skilled PT to increase their independence and safety with mobility to allow discharge to the venue listed below.       Follow Up Recommendations Other (comment) (cardiopulmonary rehab )    Equipment Recommendations  None recommended by PT    Recommendations for Other Services OT consult     Precautions / Restrictions Precautions Precautions: Fall Precaution Comments: 2 falls in week prior to admission, monitor O2 Restrictions Weight Bearing Restrictions: No      Mobility  Bed Mobility Overal bed mobility: Modified Independent             General bed mobility comments: with rail, HOB up 30*  Transfers Overall transfer level: Needs assistance Equipment used: None Transfers: Sit to/from Stand Sit to Stand: Min guard         General transfer comment: min guard for balance/safety  Ambulation/Gait Ambulation/Gait assistance: Min guard Ambulation Distance (Feet): 85 Feet Assistive device: None Gait Pattern/deviations: Decreased stride length   Gait velocity interpretation: Below normal speed for age/gender General Gait Details: SaO2 82% on RA at rest, 92% on 6L [place] walking, 2-3/4 dyspnea; pt reached for handrail for support x 3 but refused use of RW  Stairs            Wheelchair Mobility    Modified Rankin (Stroke Patients Only)       Balance Overall balance  assessment: Needs assistance   Sitting balance-Leahy Scale: Normal       Standing balance-Leahy Scale: Good                               Pertinent Vitals/Pain Pain Assessment: No/denies pain    Home Living Family/patient expects to be discharged to:: Private residence Living Arrangements: Spouse/significant other Available Help at Discharge: Family;Available 24 hours/day Type of Home: House Home Access: Stairs to enter Entrance Stairs-Rails: None Entrance Stairs-Number of Steps: 3-4 Home Layout: Multi-level;Able to live on main level with bedroom/bathroom Home Equipment: Crutches      Prior Function Level of Independence: Independent               Hand Dominance        Extremity/Trunk Assessment   Upper Extremity Assessment: Defer to OT evaluation           Lower Extremity Assessment: Overall WFL for tasks assessed      Cervical / Trunk Assessment: Kyphotic  Communication   Communication: No difficulties  Cognition Arousal/Alertness: Awake/alert Behavior During Therapy: WFL for tasks assessed/performed Overall Cognitive Status: Within Functional Limits for tasks assessed                      General Comments      Exercises        Assessment/Plan    PT Assessment Patient needs continued PT services  PT Diagnosis Generalized weakness   PT Problem  List Decreased activity tolerance;Decreased mobility;Cardiopulmonary status limiting activity  PT Treatment Interventions Gait training;Stair training;Functional mobility training;Patient/family education;Balance training;Therapeutic exercise   PT Goals (Current goals can be found in the Care Plan section) Acute Rehab PT Goals Patient Stated Goal: to go shopping with her daughter PT Goal Formulation: With patient/family Time For Goal Achievement: 12/03/15 Potential to Achieve Goals: Good    Frequency Min 3X/week   Barriers to discharge        Co-evaluation                End of Session Equipment Utilized During Treatment: Gait belt;Oxygen Activity Tolerance: Patient tolerated treatment well Patient left: in chair;with call bell/phone within reach;with nursing/sitter in room Nurse Communication: Mobility status (need for O2, pt requesting her blood sugars be checked)         Time: 1129-1201 PT Time Calculation (min) (ACUTE ONLY): 32 min   Charges:   PT Evaluation $PT Eval Low Complexity: 1 Procedure PT Treatments $Gait Training: 8-22 mins   PT G Codes:        Philomena Doheny 11/19/2015, 12:32 PM [phone]

## 2015-11-19 NOTE — Progress Notes (Signed)
Spoke with patient about her A1c of 8.2%.  Patient told me she has a Hx of DM and is followed closely by her PCP: Dr. Harlan Stains.  Stopped taking Metformin a while ago b/c Dr. Dema Severin told her to stop taking after she lost 30+ pounds.  A1c at that time was around 6% per patient report.    Patient recently saw Dr. Dema Severin for follow-up back on 11/05/15.  Dr. Dema Severin drew an A1c and it was 8%.  Patient stated she had taken several courses of Prednisone and got a shot of Cortisone for Bursitis as well.  Patient stated that Dr. Dema Severin wants patient to check her CBGs daily at home and to follow back up with her to see if she needs to restart her Metformin.  Patient has CBG meter at home and is comfortable checking CBGs.  Reviewed CBG goals at home with patient and gave pt educational pamphlets on healthy eating with diabetes and blood sugar goals.     --Will follow patient during hospitalization--  Wyn Quaker RN, MSN, CDE Diabetes Coordinator Inpatient Glycemic Control Team Team Pager: [phone] (8a-5p)

## 2015-11-19 NOTE — Progress Notes (Addendum)
Inpatient Diabetes Program Recommendations  AACE/ADA: New Consensus Statement on Inpatient Glycemic Control (2015)  Target Ranges:  Prepandial:   less than 140 mg/dL      Peak postprandial:   less than 180 mg/dL (1-2 hours)      Critically ill patients:  140 - 180 mg/dL   Results for SOPHINA, MITTEN (MRN [id number]) as of 11/19/2015 08:22  Ref. Range 11/18/2015 12:17  Glucose Latest Ref Range: 65-99 mg/dL 277 (H)   Results for LINZIE, CRISS (MRN [id number]) as of 11/19/2015 08:22  Ref. Range 11/18/2015 16:48  Hemoglobin A1C Latest Ref Range: 4.8-5.6 % 8.2 (H)    Admit with: COPD/ Sepsis  History: COPD, HTN     -Did not see a History of DM mentioned in patient's H&P.  Note patient has a lot of respiratory issues and likely has used steroids at home in the past.  -Glucose 277 mg/dl on admission.  Hemoglobin A1c results of 8.2% indicates positive diagnosis of DM per ADA Standards of Care guidelines.  -Note patient currently receiving IV Solumedrol 60 mg Q6 hours at present.    MD- Is this a new diagnosis of DM?  If so, patient will likely need some sort of oral DM medication at time of discharge.  Since she is 82 years of age, Metformin may not be the best option.  Could start with a DPP-4 inhibitor medication like Tradjenta 5 mg daily.  This medication has lower risk of Hypoglycemia and does not need to be renally dosed.  Patient will also need CBG meter Rx at time of discharge- CBG Meter Order [phone] (patient will need physical copy of the Rx)   MD- Please also consider starting Novolog Moderate Correction Scale/ SSI (0-15 units) TID AC + HS     --Will follow patient during hospitalization--  Wyn Quaker RN, MSN, CDE Diabetes Coordinator Inpatient Glycemic Control Team Team Pager: [phone] (8a-5p)

## 2015-11-19 NOTE — Care Management Note (Signed)
Case Management Note  Patient Details  Name: [name] MRN: [id number] Date of Birth: 11/30/1931  Subjective/Objective:          resp distress at 40% o2 required          Action/Plan:Date:  November 19, 2015 Chart reviewed for concurrent status and case management needs. Will continue to follow patient for changes and needs: [name], BSN, RN, [place]   [phone]   Expected Discharge Date:   (unknown)               Expected Discharge Plan:  Home/Self Care  In-House Referral:  NA  Discharge planning Services  CM Consult  Post Acute Care Choice:  NA Choice offered to:  NA  DME Arranged:  N/A DME Agency:  NA  HH Arranged:  NA HH Agency:  NA  Status of Service:  In process, will continue to follow  Medicare Important Message Given:    Date Medicare IM Given:    Medicare IM give by:    Date Additional Medicare IM Given:    Additional Medicare Important Message give by:     If discussed at [place] of Stay Meetings, dates discussed:    Additional Comments:  Leeroy Cha, RN 11/19/2015, 10:42 AM

## 2015-11-19 NOTE — Progress Notes (Signed)
Patient Demographics  [name], is a 82 y.o. female, DOB - 11/28/1931, DDU:[id number]  Admit date - 11/18/2015   Admitting Physician Theodis Blaze, MD  Outpatient Primary MD for the patient is [name], MD  LOS - 1   Chief Complaint  Patient presents with  . Shortness of Breath        Admission HPI/Brief narrative: 82 yo female with known COPD, aortic stenosis and s/p AVR, HTN, presents to [facility] ED for complaints of dyspnea and cough, Significant for COPD exacerbation, and pneumonia. Subjective:   Jalaysia Lobb today has, No headache, No chest pain, No abdominal pain -post dyspnea is better today , complains of cough, turning from green to white . Assessment & Plan    Principal Problem:   COPD exacerbation ([place]) Active Problems:   COPD GOLD II    Essential hypertension   Sepsis ([place])   Acute kidney injury ([place])   Hyperglycemia  Acute hypoxic respiratory failure - Multifactorial, secondary to COPD exacerbation and minutes acquired pneumonia, evidence of CO2 retention, no indication for BiPAP, improving  COPD exacerbation ([place]), GOLD II - Check with significant wheezing on admission, hypoxia, appears to be on improving on IV Solu-Medrol, will continue with the steroid taper, continue with nebulizer treatment.  Community acquired pneumonia - Will continue with Rocephin and azithromycin, will discontinue vancomycin and Zosyn. - CT chest significant for multifocal pneumonia - Influenza negative - Follow on blood cultures   Acute kidney injury ([place]) - Continue with IV fluids, continue to hold Lasix, will check BMP in a.m., will monitor closely as on Avapro .   Essential hypertension - continue Norvasc and Avapro    Hyperglycemia - ? Steroid induced, hemoglobin A1c is 8.2, start on insulin sliding scale, he likely will need metformin on discharge.  Code Status: Full  Family  Communication: Discussed with patient  Disposition Plan: PendingPT consult   Procedures  none   Consults   none   Medications  Scheduled Meds: . amLODipine  10 mg Oral Daily  . aspirin EC  81 mg Oral Daily  . azithromycin  500 mg Intravenous Q24H  . budesonide  1 mg Nebulization BID  . enoxaparin (LOVENOX) injection  40 mg Subcutaneous Q24H  . guaiFENesin  600 mg Oral BID  . ipratropium-albuterol  3 mL Nebulization TID  . irbesartan  150 mg Oral Daily  . methylPREDNISolone (SOLU-MEDROL) injection  60 mg Intravenous Q6H  . piperacillin-tazobactam (ZOSYN)  IV  3.375 g Intravenous Q8H  . pravastatin  20 mg Oral Daily  . sertraline  50 mg Oral q morning - 10a  . sodium chloride flush  3 mL Intravenous Q12H  . vancomycin  500 mg Intravenous Q24H   Continuous Infusions:  PRN Meds:.acetaminophen, albuterol, guaiFENesin-dextromethorphan, ondansetron **OR** ondansetron (ZOFRAN) IV  DVT Prophylaxis  Lovenox -  Lab Results  Component Value Date   PLT 311 11/18/2015    Antibiotics    Anti-infectives    Start     Dose/Rate Route Frequency Ordered Stop   11/19/15 2200  vancomycin (VANCOCIN) 500 mg in sodium chloride 0.9 % 100 mL IVPB     500 mg 100 mL/hr over 60 Minutes Intravenous Every 24 hours 11/18/15 2021  11/19/15 1830  azithromycin (ZITHROMAX) 500 mg in dextrose 5 % 250 mL IVPB  Status:  Discontinued     500 mg 250 mL/hr over 60 Minutes Intravenous Every 24 hours 11/18/15 1622 11/18/15 2010   11/19/15 1800  cefTRIAXone (ROCEPHIN) 1 g in dextrose 5 % 50 mL IVPB  Status:  Discontinued     1 g 100 mL/hr over 30 Minutes Intravenous Every 24 hours 11/18/15 1622 11/18/15 2010   11/19/15 1200  azithromycin (ZITHROMAX) 500 mg in dextrose 5 % 250 mL IVPB     500 mg 250 mL/hr over 60 Minutes Intravenous Every 24 hours 11/18/15 2147     11/18/15 2200  piperacillin-tazobactam (ZOSYN) IVPB 3.375 g     3.375 g 12.5 mL/hr over 240 Minutes Intravenous Every 8 hours 11/18/15  2023     11/18/15 2030  vancomycin (VANCOCIN) 500 mg in sodium chloride 0.9 % 100 mL IVPB     500 mg 100 mL/hr over 60 Minutes Intravenous STAT 11/18/15 2020 11/18/15 2156   11/18/15 1615  cefTRIAXone (ROCEPHIN) 1 g in dextrose 5 % 50 mL IVPB     1 g 100 mL/hr over 30 Minutes Intravenous  Once 11/18/15 1612 11/18/15 1735   11/18/15 1300  azithromycin (ZITHROMAX) 500 mg in dextrose 5 % 250 mL IVPB     500 mg 250 mL/hr over 60 Minutes Intravenous  Once 11/18/15 1252 11/18/15 1522          Objective:   Filed Vitals:   11/19/15 0727 11/19/15 0801 11/19/15 0844 11/19/15 0935  BP:    156/62  Pulse:      Temp: 97.8 F (36.6 C)     TempSrc: Oral     Resp:      Height:      Weight:      SpO2:  99% 99%     Wt Readings from Last 3 Encounters:  11/18/15 50.9 kg (112 lb 3.4 oz)  11/01/15 51.075 kg (112 lb 9.6 oz)  10/19/15 52.209 kg (115 lb 1.6 oz)     Intake/Output Summary (Last 24 hours) at 11/19/15 1215 Last data filed at 11/19/15 0727  Gross per 24 hour  Intake    270 ml  Output    300 ml  Net    -30 ml     Physical Exam  Awake Alert, Oriented X 3,  [place].AT,PERRAL Supple Neck,No JVD, No cervical lymphadenopathy appriciated.  Symmetrical Chest wall movement, Good air movement bilaterally, no wheezing RRR,No Gallops,Rubs or new Murmurs, No Parasternal Heave +ve B.Sounds, Abd Soft, No tenderness, No organomegaly appriciated, No rebound - guarding or rigidity. No Cyanosis, Clubbing or edema, No new Rash or bruise     Data Review   Micro Results Recent Results (from the past 240 hour(s))  Culture, blood (x 2)     Status: None (Preliminary result)   Collection Time: 11/18/15  4:50 PM  Result Value Ref Range Status   Specimen Description BLOOD LEFT ARM  Final   Special Requests BOTTLES DRAWN AEROBIC AND ANAEROBIC 5 ML  Final   Culture   Final    NO GROWTH < 24 HOURS Performed at [facility]    Report Status PENDING  Incomplete  Culture, blood (x 2)      Status: None (Preliminary result)   Collection Time: 11/18/15  4:53 PM  Result Value Ref Range Status   Specimen Description BLOOD RIGHT ARM  Final   Special Requests BOTTLES DRAWN AEROBIC AND ANAEROBIC 5CC  Final   Culture   Final    NO GROWTH < 24 HOURS Performed at [facility]    Report Status PENDING  Incomplete  MRSA PCR Screening     Status: None   Collection Time: 11/18/15 10:12 PM  Result Value Ref Range Status   MRSA by PCR NEGATIVE NEGATIVE Final    Comment:        The GeneXpert MRSA Assay (FDA approved for NASAL specimens only), is one component of a comprehensive MRSA colonization surveillance program. It is not intended to diagnose MRSA infection nor to guide or monitor treatment for MRSA infections.     Radiology Reports Dg Chest 2 View  11/18/2015  CLINICAL DATA:  Shortness of breath. EXAM: CHEST  2 VIEW COMPARISON:  July 20, 2014. FINDINGS: Stable cardiomediastinal silhouette. Sternotomy wires are noted. Emphysematous disease is noted in both upper lobes. No pneumothorax or pleural effusion is noted. Atherosclerosis of thoracic aorta is noted. Multilevel degenerative disc disease is noted throughout the thoracic spine. Status post aortic valve replacement. Stable mild bibasilar scarring is noted. IMPRESSION: Stable emphysematous disease seen in both upper lobes with stable mild bibasilar scarring. Electronically Signed   By: Marijo Conception, M.D.   On: 11/18/2015 12:42   Ct Chest Wo Contrast  11/18/2015  CLINICAL DATA:  Acute onset of progressive dyspnea, with productive cough and chest tightness. Malaise and nausea. Vomiting. Initial encounter. EXAM: CT CHEST WITHOUT CONTRAST TECHNIQUE: Multidetector CT imaging of the chest was performed following the standard protocol without IV contrast. COMPARISON:  Chest radiograph performed earlier today at 12:34 p.m., and CT of the chest performed 01/18/2015 FINDINGS: Patchy bibasilar airspace opacities are noted,  compatible with multifocal pneumonia. Underlying diffuse emphysematous change is noted, most prominent at the upper lung lobes. No pleural effusion or pneumothorax is seen. No suspicious masses are identified. Bilateral bronchomalacia is noted, more prominent on the right. Scattered calcification is noted along the thoracic aorta and proximal great vessels. Calcification is noted at the aortic valve. Diffuse coronary artery calcifications are seen. The patient is status post median sternotomy. No definite mediastinal lymphadenopathy is appreciated. No pericardial effusion is identified. Note is made of a moderate hiatal hernia. The thyroid gland is unremarkable. No axillary lymphadenopathy is seen. The visualized portions of the liver and spleen are grossly unremarkable. Scattered calcification is noted along the proximal abdominal aorta and its branches. The visualized portions of the adrenal glands and kidneys are grossly unremarkable, with mild nonspecific perinephric stranding noted bilaterally. No acute osseous abnormalities are identified. There is chronic compression deformity involving the superior endplate of vertebral body T12. IMPRESSION: 1. Patchy bibasilar airspace opacities, compatible with multifocal pneumonia. 2. Underlying diffuse emphysematous change, most prominent at the upper lung lobes. 3. Bilateral bronchomalacia, more prominent on the right. 4. Calcification at the aortic valve. Diffuse coronary artery calcifications seen. 5. Moderate hiatal hernia noted. 6. Scattered calcification along the proximal abdominal aorta and its branches. Electronically Signed   By: Garald Balding M.D.   On: 11/18/2015 23:15     CBC  Recent Labs Lab 11/18/15 1217 11/18/15 1648  WBC 23.9* 21.7*  HGB 12.8 11.1*  HCT 42.9 36.6  PLT 332 311  MCV 83.3 82.6  MCH 24.9* 25.1*  MCHC 29.8* 30.3  RDW 16.3* 16.3*  LYMPHSABS  --  0.6*  MONOABS  --  0.9  EOSABS  --  0.0  BASOSABS  --  0.0    Chemistries     Recent Labs  Lab 11/18/15 1217  NA 140  K 4.4  CL 105  CO2 21*  GLUCOSE 277*  BUN 38*  CREATININE 1.46*  CALCIUM 9.7   ------------------------------------------------------------------------------------------------------------------ estimated creatinine clearance is 21.7 mL/min (by C-G formula based on Cr of 1.46). ------------------------------------------------------------------------------------------------------------------  Recent Labs  11/18/15 1648  HGBA1C 8.2*   ------------------------------------------------------------------------------------------------------------------ No results for input(s): CHOL, HDL, LDLCALC, TRIG, CHOLHDL, LDLDIRECT in the last 72 hours. ------------------------------------------------------------------------------------------------------------------ No results for input(s): TSH, T4TOTAL, T3FREE, THYROIDAB in the last 72 hours.  Invalid input(s): FREET3 ------------------------------------------------------------------------------------------------------------------ No results for input(s): VITAMINB12, FOLATE, FERRITIN, TIBC, IRON, RETICCTPCT in the last 72 hours.  Coagulation profile  Recent Labs Lab 11/18/15 1648  INR 1.20    No results for input(s): DDIMER in the last 72 hours.  Cardiac Enzymes No results for input(s): CKMB, TROPONINI, MYOGLOBIN in the last 168 hours.  Invalid input(s): CK ------------------------------------------------------------------------------------------------------------------ Invalid input(s): POCBNP     Time Spent in minutes   30 minutes   Evia Goldsmith M.D on 11/19/2015 at 12:15 PM  Between 7am to 7pm - Pager - [phone]  After 7pm go to [email or web address] - password [facility]  [facility]   Office  [phone]

## 2015-11-19 NOTE — Progress Notes (Signed)
SATURATION QUALIFICATIONS: (This note is used to comply with regulatory documentation for home oxygen)  Patient Saturations on Room Air at Rest = 82%    Patient Saturations on 6 Liters of oxygen while Ambulating = 93%  Please briefly explain why patient needs home oxygen: to maintain SaO2 greater than 90%   Blondell Reveal Kistler PT 11/19/2015  [phone]

## 2015-11-19 NOTE — Progress Notes (Signed)
CRITICAL VALUE ALERT  Critical value received:  Lactic acid 2.7  Date of notification:  11/19/15  Time of notification:  1238  Critical value read back: Yes  Nurse who received alert:  Gae Gallop, RN  MD notified (1st page):  L. [place]  Time of first page:  [phone]  MD notified (2nd page):  Time of second page:  Responding MD:  Roger Shelter  Time MD responded:  [phone]

## 2015-11-19 NOTE — Progress Notes (Signed)
Initial Nutrition Assessment  DOCUMENTATION CODES:   Non-severe (moderate) malnutrition in context of acute illness/injury  INTERVENTION:  -RD to continue to monitor for needs -Pt declined ONS   NUTRITION DIAGNOSIS:   Malnutrition related to acute illness as evidenced by energy intake < 75% for > 7 days, moderate depletion of body fat, moderate depletions of muscle mass.  GOAL:   Patient will meet greater than or equal to 90% of their needs  MONITOR:   PO intake, Labs, I & O's, Skin  REASON FOR ASSESSMENT:   Consult COPD Protocol  ASSESSMENT:   Pt is 82 yo female with known COPD, aortic stenosis and s/p AVR, HTN, presents to [facility] ED with main concern of several days duration of progressive dyspnea that initially started with exertion and has progressed to dyspnea at rest in the past 24 hours  Spoke with Ms. Fifield at bedside. Prior to admission appetite was down for at least a week. Stated she would eat half a biscuit in the morning, her and her husband would split whatever they have for lunch, and then for dinner she would normally eat half a peanut butter sandwich. She also endorses weight loss of 10# from 117-107 in 1 week.  This would be severe for that time frame.  Nutrition-Focused physical exam completed. Findings are mild fat depletion, moderate muscle depletion, and no edema.   During stay, she is eating much better she says, though it is likely related to solumedrol. No PO intake in chart.  She stated she has a refrigerator full of ensure, but doesn't drink it. Declined inpatient Ensure.  Follow for PO intake.  Labs: CBGs: 131-209, BUN 38, Cr 1.46, EGFR 33 Medications: Solumedrol  Diet Order:  Diet Carb Modified Fluid consistency:: Thin; Room service appropriate?: Yes  Skin:  Wound (see comment) (Ecchymosis to R A)  Last BM:  3/2  Height:   Ht Readings from Last 1 Encounters:  11/18/15 5' (1.524 m)    Weight:   Wt Readings from Last 1 Encounters:   11/18/15 112 lb 3.4 oz (50.9 kg)    Ideal Body Weight:  45.45 kg  BMI:  Body mass index is 21.92 kg/(m^2).  Estimated Nutritional Needs:   Kcal:  1550-1750  Protein:  50-62 grams  Fluid:  >/= 1.5L  EDUCATION NEEDS:   No education needs identified at this time  Satira Anis. Korah Hufstedler, MS, RD LDN After Hours/Weekend Pager [phone]

## 2015-11-19 NOTE — Evaluation (Signed)
Occupational Therapy Evaluation Patient Details Name: [name] MRN: [id number] DOB: [birth year] Today's Date: 11/19/2015    History of Present Illness 82 y.o. female with h/o DM, glaucome, COPD, AVR, HTN admitted with dyspnea, cough. Dx of COPD exacerbation, bronchitis, AKI, sepsis.    Clinical Impression   Pt was admitted for the above.  She is independent with ADLs at baseline, but has been weaker recently and has had 2 falls.  She will benefit from skilled OT to increase safety and independence with adls and reinforce energy conservation. Goals are for set up to min guard levels    Follow Up Recommendations  Supervision/Assistance - 24 hour    Equipment Recommendations   (likely none)    Recommendations for Other Services       Precautions / Restrictions Precautions Precautions: Fall Precaution Comments: 2 falls in week prior to admission, monitor O2 Restrictions Weight Bearing Restrictions: No      Mobility Bed Mobility Overal bed mobility: Modified Independent             General bed mobility comments: oob  Transfers Overall transfer level: Needs assistance Equipment used: None Transfers: Sit to/from Stand Sit to Stand: Min guard         General transfer comment: for safety    Balance Overall balance assessment: Needs assistance   Sitting balance-Leahy Scale: Normal       Standing balance-Leahy Scale: Good                              ADL Overall ADL's : Needs assistance/impaired             Lower Body Bathing: Minimal assistance;Sit to/from stand       Lower Body Dressing: Moderate assistance;Sit to/from stand   Toilet Transfer: Min guard;Minimal assistance;Ambulation;BSC             General ADL Comments: performed ADL in bathroom from 3:1 at sink.  Cues for rest breaks.  Did not have good wave length on sats:  down to 76 but quickly returned to mid 80s then up to 97% on 3 liters.  Height of commode was high so  needed more assistance with LB ADLs.  Pt unsteady and needed occasional steadying assistance when walking. Educated on energy conservation and encouraged sponge bathing initially due to energy demands of standing shower as well as safety     Vision     Perception     Praxis      Pertinent Vitals/Pain Pain Assessment: No/denies pain     Hand Dominance     Extremity/Trunk Assessment Upper Extremity Assessment Upper Extremity Assessment: Overall WFL for tasks assessed      Cervical / Trunk Assessment Cervical / Trunk Assessment: Kyphotic   Communication Communication Communication: No difficulties   Cognition Arousal/Alertness: Awake/alert Behavior During Therapy: WFL for tasks assessed/performed Overall Cognitive Status: Within Functional Limits for tasks assessed                     General Comments       Exercises       Shoulder Instructions      Home Living Family/patient expects to be discharged to:: Private residence Living Arrangements: Spouse/significant other Available Help at Discharge: Family;Available 24 hours/day Type of Home: House Home Access: Stairs to enter [employer] of Steps: 3-4 Entrance Stairs-Rails: None Home Layout: Multi-level;Able to live on main level with bedroom/bathroom  Bathroom Shower/Tub: Occupational psychologist: Standard     Home Equipment: Grab bars - tub/shower          Prior Functioning/Environment Level of Independence: Independent             OT Diagnosis: Generalized weakness   OT Problem List: Decreased strength;Decreased activity tolerance;Impaired balance (sitting and/or standing);Decreased knowledge of use of DME or AE;Cardiopulmonary status limiting activity   OT Treatment/Interventions: Self-care/ADL training;Energy conservation;DME and/or AE instruction;Patient/family education;Balance training    OT Goals(Current goals can be found in the care plan section) Acute  Rehab OT Goals Patient Stated Goal: to go shopping with her daughter OT Goal Formulation: With patient Time For Goal Achievement: 11/26/15 Potential to Achieve Goals: Good ADL Goals Pt Will Transfer to Toilet: with supervision;ambulating;regular height toilet Pt Will Perform Tub/Shower Transfer: Shower transfer;with min guard assist;ambulating Additional ADL Goal #1: pt will complete LB adls with set up, sit to stand Additional ADL Goal #2: Pt will initiate at least 1 rest break for energy conservation without cues  OT Frequency: Min 2X/week   Barriers to D/C:            Co-evaluation              End of Session Nurse Communication:  (remake bed when time)  Activity Tolerance: Patient tolerated treatment well Patient left: in chair;with call bell/phone within reach   Time: 1884-1660 OT Time Calculation (min): 15 min Charges:  OT General Charges $OT Visit: 1 Procedure OT Evaluation $OT Eval Low Complexity: 1 Procedure OT Treatments $Self Care/Home Management : 8-22 mins G-Codes:    Frankie Scipio November 20, 2015, 3:03 PM  Lesle Chris, OTR/L [phone] 20-Nov-2015

## 2015-11-20 DIAGNOSIS — J449 Chronic obstructive pulmonary disease, unspecified: Secondary | ICD-10-CM

## 2015-11-20 LAB — CBC
HEMATOCRIT: 34.5 % — AB (ref 36.0–46.0)
HEMOGLOBIN: 10.4 g/dL — AB (ref 12.0–15.0)
MCH: 25.1 pg — ABNORMAL LOW (ref 26.0–34.0)
MCHC: 30.1 g/dL (ref 30.0–36.0)
MCV: 83.3 fL (ref 78.0–100.0)
Platelets: 318 10*3/uL (ref 150–400)
RBC: 4.14 MIL/uL (ref 3.87–5.11)
RDW: 16.5 % — ABNORMAL HIGH (ref 11.5–15.5)
WBC: 21.5 10*3/uL — AB (ref 4.0–10.5)

## 2015-11-20 LAB — GLUCOSE, CAPILLARY
GLUCOSE-CAPILLARY: 147 mg/dL — AB (ref 65–99)
GLUCOSE-CAPILLARY: 249 mg/dL — AB (ref 65–99)
Glucose-Capillary: 216 mg/dL — ABNORMAL HIGH (ref 65–99)

## 2015-11-20 LAB — BASIC METABOLIC PANEL
ANION GAP: 11 (ref 5–15)
BUN: 56 mg/dL — ABNORMAL HIGH (ref 6–20)
CALCIUM: 8.9 mg/dL (ref 8.9–10.3)
CHLORIDE: 109 mmol/L (ref 101–111)
CO2: 20 mmol/L — AB (ref 22–32)
Creatinine, Ser: 1.39 mg/dL — ABNORMAL HIGH (ref 0.44–1.00)
GFR calc non Af Amer: 35 mL/min — ABNORMAL LOW (ref >60)
GFR, EST AFRICAN AMERICAN: 40 mL/min — AB (ref >60)
GLUCOSE: 258 mg/dL — AB (ref 65–99)
POTASSIUM: 4.5 mmol/L (ref 3.5–5.1)
Sodium: 140 mmol/L (ref 135–145)

## 2015-11-20 MED ORDER — FAMOTIDINE 20 MG PO TABS
20.0000 mg | ORAL_TABLET | Freq: Every day | ORAL | Status: DC
  Administered 2015-11-20 – 2015-11-23 (×4): 20 mg via ORAL
  Filled 2015-11-20 (×4): qty 1

## 2015-11-20 MED ORDER — BUDESONIDE 0.5 MG/2ML IN SUSP
0.5000 mg | Freq: Two times a day (BID) | RESPIRATORY_TRACT | Status: DC
Start: 2015-11-20 — End: 2015-11-23
  Administered 2015-11-20 – 2015-11-23 (×6): 0.5 mg via RESPIRATORY_TRACT
  Filled 2015-11-20 (×6): qty 2

## 2015-11-20 NOTE — Progress Notes (Signed)
Patient Demographics  [name], is a 82 y.o. female, DOB - 19-Feb-1932, JME:[id number]  Admit date - 11/18/2015   Admitting Physician Theodis Blaze, MD  Outpatient Primary MD for the patient is [name], MD  LOS - 2   Chief Complaint  Patient presents with  . Shortness of Breath        Admission HPI/Brief narrative: 82 yo female with known COPD, aortic stenosis and s/p AVR, HTN, presents to [facility] ED for complaints of dyspnea and cough, Significant for COPD exacerbation, and pneumonia. Subjective:   [name] today has, No headache, No chest pain, No abdominal pain -post dyspnea is better today , complains of cough, turning from green to white . Assessment & Plan    Principal Problem:   COPD exacerbation ([place]) Active Problems:   COPD GOLD II    Essential hypertension   Sepsis ([name])   Acute kidney injury ([name])   Hyperglycemia  Acute hypoxic respiratory failure - Multifactorial, secondary to COPD exacerbation and minutes acquired pneumonia, no evidence of CO2 retention, no indication for BiPAP, improving , but still requiring 4 L nasal cannula, most likely will need home oxygen on discharge.  COPD exacerbation ([name]), GOLD II - with  significant wheezing on admission, hypoxia, appears to be on improving on IV Solucontinue with current Solu-Medrol dose today with no further taper, will reassess in a.m., will increase albuterol to every 2 hours as needed, continue with Pulmicort .[name]  Community acquired pneumonia - Will continue with Rocephin and azithromycin, will discontinue vancomycin and Zosyn. - CT chest significant for multifocal pneumonia - Influenza negative - Follow on blood cultures, remains with no growth to date  - Leukocytosis most likely related to steroids , a febrile    Acute kidney injury ([name]) - Continue with IV fluids, continue to hold Lasix, will check BMP in  a.m., will monitor closely as on Avapro .   Essential hypertension - continue Norvasc and Avapro    Hyperglycemia/diabetes mellitus  -  hemoglobin A1c is 8.2, start on insulin sliding scale , will need metformin on discharge.  Code Status: Full  Family Communication: Discussevent at bedside  disposition Plan: Pending further work up.   Procedures  none   Consults   none   Medications  Scheduled Meds: . amLODipine  10 mg Oral Daily  . aspirin EC  81 mg Oral Daily  . azithromycin  500 mg Intravenous Q24H  . budesonide  1 mg Nebulization BID  . cefTRIAXone (ROCEPHIN)  IV  1 g Intravenous Q24H  . enoxaparin (LOVENOX) injection  30 mg Subcutaneous Q24H  . guaiFENesin  600 mg Oral BID  . insulin aspart  0-15 Units Subcutaneous TID WC  . insulin glargine  6 Units Subcutaneous Daily  . ipratropium-albuterol  3 mL Nebulization TID  . irbesartan  150 mg Oral Daily  . metFORMIN  500 mg Oral Q breakfast  . methylPREDNISolone (SOLU-MEDROL) injection  60 mg Intravenous 3 times per day  . pravastatin  20 mg Oral Daily  . sertraline  50 mg Oral q morning - 10a  . sodium chloride flush  3 mL Intravenous Q12H   Continuous Infusions:  PRN Meds:.acetaminophen, albuterol, guaiFENesin-dextromethorphan, ondansetron **OR** ondansetron (ZOFRAN) IV  DVT  Prophylaxis  Lovenox -  Lab Results  Component Value Date   PLT 318 11/20/2015    Antibiotics    Anti-infectives    Start     Dose/Rate Route Frequency Ordered Stop   11/20/15 1000  cefTRIAXone (ROCEPHIN) 1 g in dextrose 5 % 50 mL IVPB     1 g 100 mL/hr over 30 Minutes Intravenous Every 24 hours 11/19/15 1258     11/19/15 2200  vancomycin (VANCOCIN) 500 mg in sodium chloride 0.9 % 100 mL IVPB  Status:  Discontinued     500 mg 100 mL/hr over 60 Minutes Intravenous Every 24 hours 11/18/15 2021 11/19/15 1258   11/19/15 1830  azithromycin (ZITHROMAX) 500 mg in dextrose 5 % 250 mL IVPB  Status:  Discontinued     500 mg 250 mL/hr  over 60 Minutes Intravenous Every 24 hours 11/18/15 1622 11/18/15 2010   11/19/15 1800  cefTRIAXone (ROCEPHIN) 1 g in dextrose 5 % 50 mL IVPB  Status:  Discontinued     1 g 100 mL/hr over 30 Minutes Intravenous Every 24 hours 11/18/15 1622 11/18/15 2010   11/19/15 1200  azithromycin (ZITHROMAX) 500 mg in dextrose 5 % 250 mL IVPB     500 mg 250 mL/hr over 60 Minutes Intravenous Every 24 hours 11/18/15 2147     11/18/15 2200  piperacillin-tazobactam (ZOSYN) IVPB 3.375 g  Status:  Discontinued     3.375 g 12.5 mL/hr over 240 Minutes Intravenous Every 8 hours 11/18/15 2023 11/19/15 1258   11/18/15 2030  vancomycin (VANCOCIN) 500 mg in sodium chloride 0.9 % 100 mL IVPB     500 mg 100 mL/hr over 60 Minutes Intravenous STAT 11/18/15 2020 11/18/15 2156   11/18/15 1615  cefTRIAXone (ROCEPHIN) 1 g in dextrose 5 % 50 mL IVPB     1 g 100 mL/hr over 30 Minutes Intravenous  Once 11/18/15 1612 11/18/15 1735   11/18/15 1300  azithromycin (ZITHROMAX) 500 mg in dextrose 5 % 250 mL IVPB     500 mg 250 mL/hr over 60 Minutes Intravenous  Once 11/18/15 1252 11/18/15 1522          Objective:   Filed Vitals:   11/20/15 0535 11/20/15 0808 11/20/15 1414 11/20/15 1448  BP: 152/56   127/56  Pulse: 70   87  Temp: 97.7 F (36.5 C)   97.9 F (36.6 C)  TempSrc: Oral   Oral  Resp: 18   18  Height:      Weight:      SpO2: 97% 92% 90% 93%    Wt Readings from Last 3 Encounters:  11/19/15 49.2 kg (108 lb 7.5 oz)  11/01/15 51.075 kg (112 lb 9.6 oz)  10/19/15 52.209 kg (115 lb 1.6 oz)     Intake/Output Summary (Last 24 hours) at 11/20/15 1558 Last data filed at 11/20/15 0930  Gross per 24 hour  Intake    240 ml  Output      0 ml  Net    240 ml     Physical Exam  Awake Alert, Oriented X 3,  [place].AT,PERRAL Supple Neck,No JVD, No cervical lymphadenopathy appriciated.  Symmetrical Chest wall movement, Good air movement bilaterally, no wheezing RRR,No Gallops,Rubs or new Murmurs, No Parasternal  Heave +ve B.Sounds, Abd Soft, No tenderness, No organomegaly appriciated, No rebound - guarding or rigidity. No Cyanosis, Clubbing or edema, No new Rash or bruise     Data Review   Micro Results Recent Results (from the past 240  hour(s))  Culture, blood (x 2)     Status: None (Preliminary result)   Collection Time: 11/18/15  4:50 PM  Result Value Ref Range Status   Specimen Description BLOOD LEFT ARM  Final   Special Requests BOTTLES DRAWN AEROBIC AND ANAEROBIC 5 ML  Final   Culture   Final    NO GROWTH 2 DAYS Performed at [facility]    Report Status PENDING  Incomplete  Culture, blood (x 2)     Status: None (Preliminary result)   Collection Time: 11/18/15  4:53 PM  Result Value Ref Range Status   Specimen Description BLOOD RIGHT ARM  Final   Special Requests BOTTLES DRAWN AEROBIC AND ANAEROBIC 5CC  Final   Culture   Final    NO GROWTH 2 DAYS Performed at [facility]    Report Status PENDING  Incomplete  Respiratory virus panel     Status: None   Collection Time: 11/18/15  7:45 PM  Result Value Ref Range Status   Source - RVPAN NOSE  Corrected   Respiratory Syncytial Virus A Negative Negative Final   Respiratory Syncytial Virus B Negative Negative Final   Influenza A Negative Negative Final   Influenza B Negative Negative Final   Parainfluenza 1 Negative Negative Final   Parainfluenza 2 Negative Negative Final   Parainfluenza 3 Negative Negative Final   Metapneumovirus Negative Negative Final   Rhinovirus Negative Negative Final   Adenovirus Negative Negative Final    Comment: (NOTE) Performed At: [facility] [place], [place] [id number] Lindon Romp MD [id number]   MRSA PCR Screening     Status: None   Collection Time: 11/18/15 10:12 PM  Result Value Ref Range Status   MRSA by PCR NEGATIVE NEGATIVE Final    Comment:        The GeneXpert MRSA Assay (FDA approved for NASAL specimens only), is one component of  a comprehensive MRSA colonization surveillance program. It is not intended to diagnose MRSA infection nor to guide or monitor treatment for MRSA infections.     Radiology Reports Dg Chest 2 View  11/18/2015  CLINICAL DATA:  Shortness of breath. EXAM: CHEST  2 VIEW COMPARISON:  July 20, 2014. FINDINGS: Stable cardiomediastinal silhouette. Sternotomy wires are noted. Emphysematous disease is noted in both upper lobes. No pneumothorax or pleural effusion is noted. Atherosclerosis of thoracic aorta is noted. Multilevel degenerative disc disease is noted throughout the thoracic spine. Status post aortic valve replacement. Stable mild bibasilar scarring is noted. IMPRESSION: Stable emphysematous disease seen in both upper lobes with stable mild bibasilar scarring. Electronically Signed   By: Marijo Conception, M.D.   On: 11/18/2015 12:42   Ct Chest Wo Contrast  11/18/2015  CLINICAL DATA:  Acute onset of progressive dyspnea, with productive cough and chest tightness. Malaise and nausea. Vomiting. Initial encounter. EXAM: CT CHEST WITHOUT CONTRAST TECHNIQUE: Multidetector CT imaging of the chest was performed following the standard protocol without IV contrast. COMPARISON:  Chest radiograph performed earlier today at 12:34 p.m., and CT of the chest performed 01/18/2015 FINDINGS: Patchy bibasilar airspace opacities are noted, compatible with multifocal pneumonia. Underlying diffuse emphysematous change is noted, most prominent at the upper lung lobes. No pleural effusion or pneumothorax is seen. No suspicious masses are identified. Bilateral bronchomalacia is noted, more prominent on the right. Scattered calcification is noted along the thoracic aorta and proximal great vessels. Calcification is noted at the aortic valve. Diffuse coronary artery calcifications are seen.  The patient is status post median sternotomy. No definite mediastinal lymphadenopathy is appreciated. No pericardial effusion is identified.  Note is made of a moderate hiatal hernia. The thyroid gland is unremarkable. No axillary lymphadenopathy is seen. The visualized portions of the liver and spleen are grossly unremarkable. Scattered calcification is noted along the proximal abdominal aorta and its branches. The visualized portions of the adrenal glands and kidneys are grossly unremarkable, with mild nonspecific perinephric stranding noted bilaterally. No acute osseous abnormalities are identified. There is chronic compression deformity involving the superior endplate of vertebral body T12. IMPRESSION: 1. Patchy bibasilar airspace opacities, compatible with multifocal pneumonia. 2. Underlying diffuse emphysematous change, most prominent at the upper lung lobes. 3. Bilateral bronchomalacia, more prominent on the right. 4. Calcification at the aortic valve. Diffuse coronary artery calcifications seen. 5. Moderate hiatal hernia noted. 6. Scattered calcification along the proximal abdominal aorta and its branches. Electronically Signed   By: Garald Balding M.D.   On: 11/18/2015 23:15     CBC  Recent Labs Lab 11/18/15 1217 11/18/15 1648 11/20/15 0616  WBC 23.9* 21.7* 21.5*  HGB 12.8 11.1* 10.4*  HCT 42.9 36.6 34.5*  PLT 332 311 318  MCV 83.3 82.6 83.3  MCH 24.9* 25.1* 25.1*  MCHC 29.8* 30.3 30.1  RDW 16.3* 16.3* 16.5*  LYMPHSABS  --  0.6*  --   MONOABS  --  0.9  --   EOSABS  --  0.0  --   BASOSABS  --  0.0  --     Chemistries   Recent Labs Lab 11/18/15 1217 11/20/15 0616  NA 140 140  K 4.4 4.5  CL 105 109  CO2 21* 20*  GLUCOSE 277* 258*  BUN 38* 56*  CREATININE 1.46* 1.39*  CALCIUM 9.7 8.9   ------------------------------------------------------------------------------------------------------------------ estimated creatinine clearance is 24.7 mL/min (by C-G formula based on Cr of 1.39). ------------------------------------------------------------------------------------------------------------------  Recent Labs   11/18/15 1648  HGBA1C 8.2*   ------------------------------------------------------------------------------------------------------------------ No results for input(s): CHOL, HDL, LDLCALC, TRIG, CHOLHDL, LDLDIRECT in the last 72 hours. ------------------------------------------------------------------------------------------------------------------ No results for input(s): TSH, T4TOTAL, T3FREE, THYROIDAB in the last 72 hours.  Invalid input(s): FREET3 ------------------------------------------------------------------------------------------------------------------ No results for input(s): VITAMINB12, FOLATE, FERRITIN, TIBC, IRON, RETICCTPCT in the last 72 hours.  Coagulation profile  Recent Labs Lab 11/18/15 1648  INR 1.20    No results for input(s): DDIMER in the last 72 hours.  Cardiac Enzymes No results for input(s): CKMB, TROPONINI, MYOGLOBIN in the last 168 hours.  Invalid input(s): CK ------------------------------------------------------------------------------------------------------------------ Invalid input(s): POCBNP     Time Spent in minutes   25 minutes   Koreena Joost M.D on 11/20/2015 at 3:58 PM  Between 7am to 7pm - Pager - [phone]  After 7pm go to [email or web address] - password [facility]  [facility]   Office  [phone]

## 2015-11-21 ENCOUNTER — Inpatient Hospital Stay (HOSPITAL_COMMUNITY): Payer: Medicare Other

## 2015-11-21 LAB — GLUCOSE, CAPILLARY
GLUCOSE-CAPILLARY: 229 mg/dL — AB (ref 65–99)
GLUCOSE-CAPILLARY: 229 mg/dL — AB (ref 65–99)
Glucose-Capillary: 307 mg/dL — ABNORMAL HIGH (ref 65–99)
Glucose-Capillary: 216 mg/dL — ABNORMAL HIGH (ref 65–99)
Glucose-Capillary: 186 mg/dL — ABNORMAL HIGH (ref 65–99)

## 2015-11-21 LAB — CBC
HCT: 38.3 % (ref 36.0–46.0)
HEMOGLOBIN: 11.8 g/dL — AB (ref 12.0–15.0)
MCH: 25.9 pg — AB (ref 26.0–34.0)
MCHC: 30.8 g/dL (ref 30.0–36.0)
MCV: 84 fL (ref 78.0–100.0)
Platelets: 324 10*3/uL (ref 150–400)
RBC: 4.56 MIL/uL (ref 3.87–5.11)
RDW: 16.6 % — ABNORMAL HIGH (ref 11.5–15.5)
WBC: 18.2 10*3/uL — ABNORMAL HIGH (ref 4.0–10.5)

## 2015-11-21 MED ORDER — METFORMIN HCL 850 MG PO TABS
850.0000 mg | ORAL_TABLET | Freq: Two times a day (BID) | ORAL | Status: DC
  Administered 2015-11-21 – 2015-11-22 (×2): 850 mg via ORAL
  Filled 2015-11-21 (×4): qty 1

## 2015-11-21 MED ORDER — METHYLPREDNISOLONE SODIUM SUCC 40 MG IJ SOLR
40.0000 mg | Freq: Three times a day (TID) | INTRAMUSCULAR | Status: DC
  Administered 2015-11-21 – 2015-11-22 (×3): 40 mg via INTRAVENOUS
  Filled 2015-11-21 (×5): qty 1

## 2015-11-21 NOTE — Progress Notes (Signed)
Patient Demographics  [name], is a 82 y.o. female, DOB - 1931-11-17, PFX:[id number]  Admit date - 11/18/2015   Admitting Physician Theodis Blaze, MD  Outpatient Primary MD for the patient is [name], MD  LOS - 3   Chief Complaint  Patient presents with  . Shortness of Breath        Admission HPI/Brief narrative: 82 yo female with known COPD, aortic stenosis and s/p AVR, HTN, presents to [facility] ED for complaints of dyspnea and cough, Significant for COPD exacerbation, and pneumonia. Subjective:   [name] today has, No headache, No chest pain, No abdominal pain -post dyspnea is better today , complains of cough, turning from green to white . Assessment & Plan    Principal Problem:   COPD exacerbation ([place]) Active Problems:   COPD GOLD II    Essential hypertension   Sepsis ([place])   Acute kidney injury ([place])   Hyperglycemia  Acute hypoxic respiratory failure - Multifactorial, secondary to COPD exacerbation and minutes acquired pneumonia, no evidence of CO2 retention, no indication for BiPAP, improving , but still requiring 4 L nasal cannula, most likely will need home oxygen on discharge.  COPD exacerbation ([place]), GOLD II - with  significant wheezing on admission, hypoxia, appears to be on improving on IV Solu-Medrol, so we'll decrease dose today , continue with albuterol to every 2 hours as needed, continue with Pulmicort .[name]  Community acquired pneumonia - Will continue with Rocephin and azithromycin, will discontinue vancomycin and Zosyn. - CT chest significant for multifocal pneumonia - Influenza negative - Follow on blood cultures, remains with no growth to date  - Leukocytosis most likely related to steroids , a febrile    Acute kidney injury ([place]) - Continue with IV fluids, continue to hold Lasix, will check BMP in a.m., will monitor closely as on Avapro .    Essential hypertension - continue Norvasc and Avapro    Hyperglycemia/diabetes mellitus  -  hemoglobin A1c is 8.2, started on metformin, as well as his sliding scale, remains uncontrolled, will increase metformin dose.  Code Status: Full  Family Communication: discussed with husband at bedside  disposition Plan: Home in 1-2 days if continues to improve   Procedures  none   Consults   none   Medications  Scheduled Meds: . amLODipine  10 mg Oral Daily  . aspirin EC  81 mg Oral Daily  . azithromycin  500 mg Intravenous Q24H  . budesonide  0.5 mg Nebulization BID  . cefTRIAXone (ROCEPHIN)  IV  1 g Intravenous Q24H  . enoxaparin (LOVENOX) injection  30 mg Subcutaneous Q24H  . famotidine  20 mg Oral Daily  . guaiFENesin  600 mg Oral BID  . insulin aspart  0-15 Units Subcutaneous TID WC  . insulin glargine  6 Units Subcutaneous Daily  . ipratropium-albuterol  3 mL Nebulization TID  . irbesartan  150 mg Oral Daily  . metFORMIN  850 mg Oral BID WC  . methylPREDNISolone (SOLU-MEDROL) injection  40 mg Intravenous 3 times per day  . pravastatin  20 mg Oral Daily  . sertraline  50 mg Oral q morning - 10a  . sodium chloride flush  3 mL Intravenous Q12H   Continuous Infusions:  PRN Meds:.acetaminophen, albuterol, guaiFENesin-dextromethorphan, ondansetron **OR** ondansetron (ZOFRAN) IV  DVT Prophylaxis  Lovenox -  Lab Results  Component Value Date   PLT 324 11/21/2015    Antibiotics    Anti-infectives    Start     Dose/Rate Route Frequency Ordered Stop   11/20/15 1000  cefTRIAXone (ROCEPHIN) 1 g in dextrose 5 % 50 mL IVPB     1 g 100 mL/hr over 30 Minutes Intravenous Every 24 hours 11/19/15 1258     11/19/15 2200  vancomycin (VANCOCIN) 500 mg in sodium chloride 0.9 % 100 mL IVPB  Status:  Discontinued     500 mg 100 mL/hr over 60 Minutes Intravenous Every 24 hours 11/18/15 2021 11/19/15 1258   11/19/15 1830  azithromycin (ZITHROMAX) 500 mg in dextrose 5 % 250 mL IVPB   Status:  Discontinued     500 mg 250 mL/hr over 60 Minutes Intravenous Every 24 hours 11/18/15 1622 11/18/15 2010   11/19/15 1800  cefTRIAXone (ROCEPHIN) 1 g in dextrose 5 % 50 mL IVPB  Status:  Discontinued     1 g 100 mL/hr over 30 Minutes Intravenous Every 24 hours 11/18/15 1622 11/18/15 2010   11/19/15 1200  azithromycin (ZITHROMAX) 500 mg in dextrose 5 % 250 mL IVPB     500 mg 250 mL/hr over 60 Minutes Intravenous Every 24 hours 11/18/15 2147     11/18/15 2200  piperacillin-tazobactam (ZOSYN) IVPB 3.375 g  Status:  Discontinued     3.375 g 12.5 mL/hr over 240 Minutes Intravenous Every 8 hours 11/18/15 2023 11/19/15 1258   11/18/15 2030  vancomycin (VANCOCIN) 500 mg in sodium chloride 0.9 % 100 mL IVPB     500 mg 100 mL/hr over 60 Minutes Intravenous STAT 11/18/15 2020 11/18/15 2156   11/18/15 1615  cefTRIAXone (ROCEPHIN) 1 g in dextrose 5 % 50 mL IVPB     1 g 100 mL/hr over 30 Minutes Intravenous  Once 11/18/15 1612 11/18/15 1735   11/18/15 1300  azithromycin (ZITHROMAX) 500 mg in dextrose 5 % 250 mL IVPB     500 mg 250 mL/hr over 60 Minutes Intravenous  Once 11/18/15 1252 11/18/15 1522          Objective:   Filed Vitals:   11/21/15 0554 11/21/15 0922 11/21/15 1400 11/21/15 1520  BP: 156/79  158/72   Pulse: 76  72   Temp: 97.8 F (36.6 C)  98.8 F (37.1 C)   TempSrc: Oral  Oral   Resp: 18  18   Height:      Weight:      SpO2: 97% 96% 96% 88%    Wt Readings from Last 3 Encounters:  11/19/15 49.2 kg (108 lb 7.5 oz)  11/01/15 51.075 kg (112 lb 9.6 oz)  10/19/15 52.209 kg (115 lb 1.6 oz)     Intake/Output Summary (Last 24 hours) at 11/21/15 1621 Last data filed at 11/21/15 0555  Gross per 24 hour  Intake    720 ml  Output      0 ml  Net    720 ml     Physical Exam  Awake Alert, Oriented X 3,  [place].AT,PERRAL Supple Neck,No JVD, No cervical lymphadenopathy appriciated.  Symmetrical Chest wall movement, Good air movement bilaterally, no wheezing RRR,No  Gallops,Rubs or new Murmurs, No Parasternal Heave +ve B.Sounds, Abd Soft, No tenderness, No organomegaly appriciated, No rebound - guarding or rigidity. No Cyanosis, Clubbing or edema, No new Rash or bruise     Data  Review   Micro Results Recent Results (from the past 240 hour(s))  Culture, blood (x 2)     Status: None (Preliminary result)   Collection Time: 11/18/15  4:50 PM  Result Value Ref Range Status   Specimen Description BLOOD LEFT ARM  Final   Special Requests BOTTLES DRAWN AEROBIC AND ANAEROBIC 5 ML  Final   Culture   Final    NO GROWTH 3 DAYS Performed at [facility]    Report Status PENDING  Incomplete  Culture, blood (x 2)     Status: None (Preliminary result)   Collection Time: 11/18/15  4:53 PM  Result Value Ref Range Status   Specimen Description BLOOD RIGHT ARM  Final   Special Requests BOTTLES DRAWN AEROBIC AND ANAEROBIC 5CC  Final   Culture   Final    NO GROWTH 3 DAYS Performed at [facility]    Report Status PENDING  Incomplete  Respiratory virus panel     Status: None   Collection Time: 11/18/15  7:45 PM  Result Value Ref Range Status   Source - RVPAN NOSE  Corrected   Respiratory Syncytial Virus A Negative Negative Final   Respiratory Syncytial Virus B Negative Negative Final   Influenza A Negative Negative Final   Influenza B Negative Negative Final   Parainfluenza 1 Negative Negative Final   Parainfluenza 2 Negative Negative Final   Parainfluenza 3 Negative Negative Final   Metapneumovirus Negative Negative Final   Rhinovirus Negative Negative Final   Adenovirus Negative Negative Final    Comment: (NOTE) Performed At: [facility] [place], [place] [id number] Lindon Romp MD FG:[id number]   MRSA PCR Screening     Status: None   Collection Time: 11/18/15 10:12 PM  Result Value Ref Range Status   MRSA by PCR NEGATIVE NEGATIVE Final    Comment:        The GeneXpert MRSA Assay (FDA approved for NASAL  specimens only), is one component of a comprehensive MRSA colonization surveillance program. It is not intended to diagnose MRSA infection nor to guide or monitor treatment for MRSA infections.     Radiology Reports Dg Chest 2 View  11/18/2015  CLINICAL DATA:  Shortness of breath. EXAM: CHEST  2 VIEW COMPARISON:  July 20, 2014. FINDINGS: Stable cardiomediastinal silhouette. Sternotomy wires are noted. Emphysematous disease is noted in both upper lobes. No pneumothorax or pleural effusion is noted. Atherosclerosis of thoracic aorta is noted. Multilevel degenerative disc disease is noted throughout the thoracic spine. Status post aortic valve replacement. Stable mild bibasilar scarring is noted. IMPRESSION: Stable emphysematous disease seen in both upper lobes with stable mild bibasilar scarring. Electronically Signed   By: Marijo Conception, M.D.   On: 11/18/2015 12:42   Ct Chest Wo Contrast  11/18/2015  CLINICAL DATA:  Acute onset of progressive dyspnea, with productive cough and chest tightness. Malaise and nausea. Vomiting. Initial encounter. EXAM: CT CHEST WITHOUT CONTRAST TECHNIQUE: Multidetector CT imaging of the chest was performed following the standard protocol without IV contrast. COMPARISON:  Chest radiograph performed earlier today at 12:34 p.m., and CT of the chest performed 01/18/2015 FINDINGS: Patchy bibasilar airspace opacities are noted, compatible with multifocal pneumonia. Underlying diffuse emphysematous change is noted, most prominent at the upper lung lobes. No pleural effusion or pneumothorax is seen. No suspicious masses are identified. Bilateral bronchomalacia is noted, more prominent on the right. Scattered calcification is noted along the thoracic aorta and proximal great vessels. Calcification is  noted at the aortic valve. Diffuse coronary artery calcifications are seen. The patient is status post median sternotomy. No definite mediastinal lymphadenopathy is appreciated. No  pericardial effusion is identified. Note is made of a moderate hiatal hernia. The thyroid gland is unremarkable. No axillary lymphadenopathy is seen. The visualized portions of the liver and spleen are grossly unremarkable. Scattered calcification is noted along the proximal abdominal aorta and its branches. The visualized portions of the adrenal glands and kidneys are grossly unremarkable, with mild nonspecific perinephric stranding noted bilaterally. No acute osseous abnormalities are identified. There is chronic compression deformity involving the superior endplate of vertebral body T12. IMPRESSION: 1. Patchy bibasilar airspace opacities, compatible with multifocal pneumonia. 2. Underlying diffuse emphysematous change, most prominent at the upper lung lobes. 3. Bilateral bronchomalacia, more prominent on the right. 4. Calcification at the aortic valve. Diffuse coronary artery calcifications seen. 5. Moderate hiatal hernia noted. 6. Scattered calcification along the proximal abdominal aorta and its branches. Electronically Signed   By: Garald Balding M.D.   On: 11/18/2015 23:15     CBC  Recent Labs Lab 11/18/15 1217 11/18/15 1648 11/20/15 0616 11/21/15 0623  WBC 23.9* 21.7* 21.5* 18.2*  HGB 12.8 11.1* 10.4* 11.8*  HCT 42.9 36.6 34.5* 38.3  PLT 332 311 318 324  MCV 83.3 82.6 83.3 84.0  MCH 24.9* 25.1* 25.1* 25.9*  MCHC 29.8* 30.3 30.1 30.8  RDW 16.3* 16.3* 16.5* 16.6*  LYMPHSABS  --  0.6*  --   --   MONOABS  --  0.9  --   --   EOSABS  --  0.0  --   --   BASOSABS  --  0.0  --   --     Chemistries   Recent Labs Lab 11/18/15 1217 11/20/15 0616  NA 140 140  K 4.4 4.5  CL 105 109  CO2 21* 20*  GLUCOSE 277* 258*  BUN 38* 56*  CREATININE 1.46* 1.39*  CALCIUM 9.7 8.9   ------------------------------------------------------------------------------------------------------------------ estimated creatinine clearance is 24.7 mL/min (by C-G formula based on Cr of  1.39). ------------------------------------------------------------------------------------------------------------------  Recent Labs  11/18/15 1648  HGBA1C 8.2*   ------------------------------------------------------------------------------------------------------------------ No results for input(s): CHOL, HDL, LDLCALC, TRIG, CHOLHDL, LDLDIRECT in the last 72 hours. ------------------------------------------------------------------------------------------------------------------ No results for input(s): TSH, T4TOTAL, T3FREE, THYROIDAB in the last 72 hours.  Invalid input(s): FREET3 ------------------------------------------------------------------------------------------------------------------ No results for input(s): VITAMINB12, FOLATE, FERRITIN, TIBC, IRON, RETICCTPCT in the last 72 hours.  Coagulation profile  Recent Labs Lab 11/18/15 1648  INR 1.20    No results for input(s): DDIMER in the last 72 hours.  Cardiac Enzymes No results for input(s): CKMB, TROPONINI, MYOGLOBIN in the last 168 hours.  Invalid input(s): CK ------------------------------------------------------------------------------------------------------------------ Invalid input(s): POCBNP     Time Spent in minutes   25 minutes   Enzo Treu M.D on 11/21/2015 at 4:21 PM  Between 7am to 7pm - Pager - [phone]  After 7pm go to [email or web address] - password [facility]  [facility]   Office  [phone]

## 2015-11-21 NOTE — Clinical Social Work Note (Signed)
CSW received referral for SNF.  Case discussed with case manager, and plan is to discharge home.  CSW to sign off please re-consult if social work needs arise.  Zollie Clemence R. Seirra Kos, MSW, LCSWA [phone]  

## 2015-11-22 LAB — BASIC METABOLIC PANEL
Anion gap: 9 (ref 5–15)
BUN: 46 mg/dL — ABNORMAL HIGH (ref 6–20)
CALCIUM: 9.1 mg/dL (ref 8.9–10.3)
CHLORIDE: 111 mmol/L (ref 101–111)
CO2: 20 mmol/L — AB (ref 22–32)
CREATININE: 1.29 mg/dL — AB (ref 0.44–1.00)
GFR calc Af Amer: 44 mL/min — ABNORMAL LOW (ref >60)
GFR calc non Af Amer: 38 mL/min — ABNORMAL LOW (ref >60)
GLUCOSE: 252 mg/dL — AB (ref 65–99)
Potassium: 4.9 mmol/L (ref 3.5–5.1)
Sodium: 140 mmol/L (ref 135–145)

## 2015-11-22 LAB — GLUCOSE, CAPILLARY
GLUCOSE-CAPILLARY: 234 mg/dL — AB (ref 65–99)
Glucose-Capillary: 207 mg/dL — ABNORMAL HIGH (ref 65–99)
Glucose-Capillary: 170 mg/dL — ABNORMAL HIGH (ref 65–99)
Glucose-Capillary: 88 mg/dL (ref 65–99)

## 2015-11-22 MED ORDER — GLIPIZIDE 5 MG PO TABS
5.0000 mg | ORAL_TABLET | Freq: Two times a day (BID) | ORAL | Status: DC
  Administered 2015-11-22: 5 mg via ORAL
  Filled 2015-11-22 (×4): qty 1

## 2015-11-22 MED ORDER — METHYLPREDNISOLONE SODIUM SUCC 40 MG IJ SOLR
40.0000 mg | Freq: Two times a day (BID) | INTRAMUSCULAR | Status: DC
  Filled 2015-11-22: qty 1

## 2015-11-22 NOTE — Progress Notes (Addendum)
Patient Demographics  [name], is a 82 y.o. female, DOB - May 12, 1932, HTD:[id number]  Admit date - 11/18/2015   Admitting Physician Theodis Blaze, MD  Outpatient Primary MD for the patient is [name], MD  LOS - 4   Chief Complaint  Patient presents with  . Shortness of Breath        Admission HPI/Brief narrative: 82 yo female with known COPD, aortic stenosis and s/p AVR, HTN, presents to [facility] ED for complaints of dyspnea and cough, Significant for COPD exacerbation, and pneumonia. Subjective:   [name] today has, No headache, No chest pain, No abdominal pain -post dyspnea is better today , complains of cough, turning from green to white . Assessment & Plan    Principal Problem:   COPD exacerbation ([place]) Active Problems:   COPD GOLD II    Essential hypertension   Sepsis ([place])   Acute kidney injury ([place])   Hyperglycemia  Acute hypoxic respiratory failure - Multifactorial, secondary to COPD exacerbation and minutes acquired pneumonia, no evidence of CO2 retention, no indication for BiPAP, improving , according to liters nasal cannula on exertion,  most likely will need home oxygen on discharge.  COPD exacerbation ([place]), GOLD II - with  significant wheezing on admission, hypoxia, appears to be on improving on IV Solu-Medrol, so we'll decrease dose today , continue with albuterol to every 2 hours as needed, continue with Pulmicort .[name] - Ambulatory  referral for pulmonary rehabilitation  Community acquired pneumonia - Will continue with Rocephin and azithromycin, will discontinue vancomycin and Zosyn. - CT chest significant for multifocal pneumonia - Influenza negative - Follow on blood cultures, remains with no growth to date  - Leukocytosis most likely related to steroids , a febrile    Acute kidney injury ([place]) - Continue with IV fluids, continue to hold Lasix, will check  BMP in a.m., will monitor closely as on Avapro .   Essential hypertension - continue Norvasc and Avapro    Hyperglycemia/diabetes mellitus  -  hemoglobin A1c is 8.2, initially started on metformin, currently changed to glipizide as renal function won't allow for metformin, as well as his sliding scale, remains uncontrolled, will increase metformin dose.  Code Status: Full  Family Communication: discussed with husband and daughter at bedside  disposition Plan: Home in a.m. if continues to improve   Procedures  none   Consults   none   Medications  Scheduled Meds: . amLODipine  10 mg Oral Daily  . aspirin EC  81 mg Oral Daily  . azithromycin  500 mg Intravenous Q24H  . budesonide  0.5 mg Nebulization BID  . cefTRIAXone (ROCEPHIN)  IV  1 g Intravenous Q24H  . enoxaparin (LOVENOX) injection  30 mg Subcutaneous Q24H  . famotidine  20 mg Oral Daily  . glipiZIDE  5 mg Oral BID AC  . guaiFENesin  600 mg Oral BID  . insulin aspart  0-15 Units Subcutaneous TID WC  . insulin glargine  6 Units Subcutaneous Daily  . ipratropium-albuterol  3 mL Nebulization TID  . irbesartan  150 mg Oral Daily  . methylPREDNISolone (SOLU-MEDROL) injection  40 mg Intravenous 3 times per day  . pravastatin  20 mg Oral Daily  . sertraline  50  mg Oral q morning - 10a  . sodium chloride flush  3 mL Intravenous Q12H   Continuous Infusions:  PRN Meds:.acetaminophen, albuterol, guaiFENesin-dextromethorphan, ondansetron **OR** ondansetron (ZOFRAN) IV  DVT Prophylaxis  Lovenox -  Lab Results  Component Value Date   PLT 324 11/21/2015    Antibiotics    Anti-infectives    Start     Dose/Rate Route Frequency Ordered Stop   11/20/15 1000  cefTRIAXone (ROCEPHIN) 1 g in dextrose 5 % 50 mL IVPB     1 g 100 mL/hr over 30 Minutes Intravenous Every 24 hours 11/19/15 1258     11/19/15 2200  vancomycin (VANCOCIN) 500 mg in sodium chloride 0.9 % 100 mL IVPB  Status:  Discontinued     500 mg 100 mL/hr over  60 Minutes Intravenous Every 24 hours 11/18/15 2021 11/19/15 1258   11/19/15 1830  azithromycin (ZITHROMAX) 500 mg in dextrose 5 % 250 mL IVPB  Status:  Discontinued     500 mg 250 mL/hr over 60 Minutes Intravenous Every 24 hours 11/18/15 1622 11/18/15 2010   11/19/15 1800  cefTRIAXone (ROCEPHIN) 1 g in dextrose 5 % 50 mL IVPB  Status:  Discontinued     1 g 100 mL/hr over 30 Minutes Intravenous Every 24 hours 11/18/15 1622 11/18/15 2010   11/19/15 1200  azithromycin (ZITHROMAX) 500 mg in dextrose 5 % 250 mL IVPB     500 mg 250 mL/hr over 60 Minutes Intravenous Every 24 hours 11/18/15 2147     11/18/15 2200  piperacillin-tazobactam (ZOSYN) IVPB 3.375 g  Status:  Discontinued     3.375 g 12.5 mL/hr over 240 Minutes Intravenous Every 8 hours 11/18/15 2023 11/19/15 1258   11/18/15 2030  vancomycin (VANCOCIN) 500 mg in sodium chloride 0.9 % 100 mL IVPB     500 mg 100 mL/hr over 60 Minutes Intravenous STAT 11/18/15 2020 11/18/15 2156   11/18/15 1615  cefTRIAXone (ROCEPHIN) 1 g in dextrose 5 % 50 mL IVPB     1 g 100 mL/hr over 30 Minutes Intravenous  Once 11/18/15 1612 11/18/15 1735   11/18/15 1300  azithromycin (ZITHROMAX) 500 mg in dextrose 5 % 250 mL IVPB     500 mg 250 mL/hr over 60 Minutes Intravenous  Once 11/18/15 1252 11/18/15 1522          Objective:   Filed Vitals:   11/22/15 0507 11/22/15 0754 11/22/15 1446 11/22/15 1516  BP: 166/70  144/59   Pulse: 82 79 84 78  Temp: 97.9 F (36.6 C)  97.8 F (36.6 C)   TempSrc: Oral  Oral   Resp: '22 18 18 18  '$ Height:      Weight:      SpO2: 96% 94% 95% 93%    Wt Readings from Last 3 Encounters:  11/19/15 49.2 kg (108 lb 7.5 oz)  11/01/15 51.075 kg (112 lb 9.6 oz)  10/19/15 52.209 kg (115 lb 1.6 oz)     Intake/Output Summary (Last 24 hours) at 11/22/15 1552 Last data filed at 11/22/15 0700  Gross per 24 hour  Intake    240 ml  Output      0 ml  Net    240 ml     Physical Exam  Awake Alert, Oriented X 3,   [place].AT,PERRAL Supple Neck,No JVD, No cervical lymphadenopathy appriciated.  Symmetrical Chest wall movement, Good air movement bilaterally, no wheezing RRR,No Gallops,Rubs or new Murmurs, No Parasternal Heave +ve B.Sounds, Abd Soft, No tenderness, No organomegaly appriciated,  No rebound - guarding or rigidity. No Cyanosis, Clubbing or edema, No new Rash or bruise     Data Review   Micro Results Recent Results (from the past 240 hour(s))  Culture, blood (x 2)     Status: None (Preliminary result)   Collection Time: 11/18/15  4:50 PM  Result Value Ref Range Status   Specimen Description BLOOD LEFT ARM  Final   Special Requests BOTTLES DRAWN AEROBIC AND ANAEROBIC 5 ML  Final   Culture   Final    NO GROWTH 4 DAYS Performed at [facility]    Report Status PENDING  Incomplete  Culture, blood (x 2)     Status: None (Preliminary result)   Collection Time: 11/18/15  4:53 PM  Result Value Ref Range Status   Specimen Description BLOOD RIGHT ARM  Final   Special Requests BOTTLES DRAWN AEROBIC AND ANAEROBIC 5CC  Final   Culture   Final    NO GROWTH 4 DAYS Performed at [facility]    Report Status PENDING  Incomplete  Respiratory virus panel     Status: None   Collection Time: 11/18/15  7:45 PM  Result Value Ref Range Status   Source - RVPAN NOSE  Corrected   Respiratory Syncytial Virus A Negative Negative Final   Respiratory Syncytial Virus B Negative Negative Final   Influenza A Negative Negative Final   Influenza B Negative Negative Final   Parainfluenza 1 Negative Negative Final   Parainfluenza 2 Negative Negative Final   Parainfluenza 3 Negative Negative Final   Metapneumovirus Negative Negative Final   Rhinovirus Negative Negative Final   Adenovirus Negative Negative Final    Comment: (NOTE) Performed At: [facility] [place], [place] [id number] Lindon Romp MD WU:[id number]   MRSA PCR Screening     Status: None   Collection  Time: 11/18/15 10:12 PM  Result Value Ref Range Status   MRSA by PCR NEGATIVE NEGATIVE Final    Comment:        The GeneXpert MRSA Assay (FDA approved for NASAL specimens only), is one component of a comprehensive MRSA colonization surveillance program. It is not intended to diagnose MRSA infection nor to guide or monitor treatment for MRSA infections.     Radiology Reports Dg Chest 2 View  11/18/2015  CLINICAL DATA:  Shortness of breath. EXAM: CHEST  2 VIEW COMPARISON:  July 20, 2014. FINDINGS: Stable cardiomediastinal silhouette. Sternotomy wires are noted. Emphysematous disease is noted in both upper lobes. No pneumothorax or pleural effusion is noted. Atherosclerosis of thoracic aorta is noted. Multilevel degenerative disc disease is noted throughout the thoracic spine. Status post aortic valve replacement. Stable mild bibasilar scarring is noted. IMPRESSION: Stable emphysematous disease seen in both upper lobes with stable mild bibasilar scarring. Electronically Signed   By: Marijo Conception, M.D.   On: 11/18/2015 12:42   Ct Chest Wo Contrast  11/18/2015  CLINICAL DATA:  Acute onset of progressive dyspnea, with productive cough and chest tightness. Malaise and nausea. Vomiting. Initial encounter. EXAM: CT CHEST WITHOUT CONTRAST TECHNIQUE: Multidetector CT imaging of the chest was performed following the standard protocol without IV contrast. COMPARISON:  Chest radiograph performed earlier today at 12:34 p.m., and CT of the chest performed 01/18/2015 FINDINGS: Patchy bibasilar airspace opacities are noted, compatible with multifocal pneumonia. Underlying diffuse emphysematous change is noted, most prominent at the upper lung lobes. No pleural effusion or pneumothorax is seen. No suspicious masses are identified. Bilateral bronchomalacia  is noted, more prominent on the right. Scattered calcification is noted along the thoracic aorta and proximal great vessels. Calcification is noted at the  aortic valve. Diffuse coronary artery calcifications are seen. The patient is status post median sternotomy. No definite mediastinal lymphadenopathy is appreciated. No pericardial effusion is identified. Note is made of a moderate hiatal hernia. The thyroid gland is unremarkable. No axillary lymphadenopathy is seen. The visualized portions of the liver and spleen are grossly unremarkable. Scattered calcification is noted along the proximal abdominal aorta and its branches. The visualized portions of the adrenal glands and kidneys are grossly unremarkable, with mild nonspecific perinephric stranding noted bilaterally. No acute osseous abnormalities are identified. There is chronic compression deformity involving the superior endplate of vertebral body T12. IMPRESSION: 1. Patchy bibasilar airspace opacities, compatible with multifocal pneumonia. 2. Underlying diffuse emphysematous change, most prominent at the upper lung lobes. 3. Bilateral bronchomalacia, more prominent on the right. 4. Calcification at the aortic valve. Diffuse coronary artery calcifications seen. 5. Moderate hiatal hernia noted. 6. Scattered calcification along the proximal abdominal aorta and its branches. Electronically Signed   By: Garald Balding M.D.   On: 11/18/2015 23:15   Dg Chest Port 1 View  11/21/2015  CLINICAL DATA:  Hypoxia, pneumonia EXAM: PORTABLE CHEST 1 VIEW COMPARISON:  CT chest dated 11/18/2015 FINDINGS: Mild patchy opacities at the medial right lung base, and to a lesser extent the left lung base, corresponding to right middle lobe, lingular, and medial right lower lobe pneumonia on recent CT. Mild blunting of the left costophrenic angle.  No pneumothorax. Cardiomegaly. Prosthetic aortic valve. Postsurgical changes related to prior CABG. Median sternotomy. IMPRESSION: Mild patchy opacities at the lung bases, corresponding to known multifocal pneumonia on CT. Electronically Signed   By: Julian Hy M.D.   On: 11/21/2015  16:46     CBC  Recent Labs Lab 11/18/15 1217 11/18/15 1648 11/20/15 0616 11/21/15 0623  WBC 23.9* 21.7* 21.5* 18.2*  HGB 12.8 11.1* 10.4* 11.8*  HCT 42.9 36.6 34.5* 38.3  PLT 332 311 318 324  MCV 83.3 82.6 83.3 84.0  MCH 24.9* 25.1* 25.1* 25.9*  MCHC 29.8* 30.3 30.1 30.8  RDW 16.3* 16.3* 16.5* 16.6*  LYMPHSABS  --  0.6*  --   --   MONOABS  --  0.9  --   --   EOSABS  --  0.0  --   --   BASOSABS  --  0.0  --   --     Chemistries   Recent Labs Lab 11/18/15 1217 11/20/15 0616 11/22/15 0558  NA 140 140 140  K 4.4 4.5 4.9  CL 105 109 111  CO2 21* 20* 20*  GLUCOSE 277* 258* 252*  BUN 38* 56* 46*  CREATININE 1.46* 1.39* 1.29*  CALCIUM 9.7 8.9 9.1   ------------------------------------------------------------------------------------------------------------------ estimated creatinine clearance is 26.6 mL/min (by C-G formula based on Cr of 1.29). ------------------------------------------------------------------------------------------------------------------ No results for input(s): HGBA1C in the last 72 hours. ------------------------------------------------------------------------------------------------------------------ No results for input(s): CHOL, HDL, LDLCALC, TRIG, CHOLHDL, LDLDIRECT in the last 72 hours. ------------------------------------------------------------------------------------------------------------------ No results for input(s): TSH, T4TOTAL, T3FREE, THYROIDAB in the last 72 hours.  Invalid input(s): FREET3 ------------------------------------------------------------------------------------------------------------------ No results for input(s): VITAMINB12, FOLATE, FERRITIN, TIBC, IRON, RETICCTPCT in the last 72 hours.  Coagulation profile  Recent Labs Lab 11/18/15 1648  INR 1.20    No results for input(s): DDIMER in the last 72 hours.  Cardiac Enzymes No results for input(s): CKMB, TROPONINI, MYOGLOBIN in the last 168 hours.  Invalid  input(s): CK ------------------------------------------------------------------------------------------------------------------ Invalid input(s): POCBNP     Time Spent in minutes   25 minutes   ELGERGAWY, DAWOOD M.D on 11/22/2015 at 3:52 PM  Between 7am to 7pm - Pager - [phone]  After 7pm go to [email or web address] - password [email or web address]  [facility]   Office  [phone]

## 2015-11-22 NOTE — Progress Notes (Signed)
Physical Therapy Treatment Patient Details Name: [name] MRN: [id number] DOB: 1932-04-03 Today's Date: 11/22/2015    History of Present Illness 82 y.o. female with h/o DM, glaucoma, COPD, AVR, HTN admitted with dyspnea, cough. Dx of COPD exacerbation, bronchitis, AKI, sepsis.     PT Comments    Pt ambulated in hallway and required supplemental oxygen (see below).  SATURATION QUALIFICATIONS: (This note is used to comply with regulatory documentation for home oxygen)  Patient Saturations on Room Air at Rest = 94%  Patient Saturations on Room Air while Ambulating = 85%  Patient Saturations on 2 Liters of oxygen while Ambulating = 94%  Please briefly explain why patient needs home oxygen: to maintain saturations above 88% during physical activity such as ambulation.   Follow Up Recommendations  Other (comment) (pt very interested in cardiopulm rehab, if not this then outpatient PT)     Equipment Recommendations  None recommended by PT    Recommendations for Other Services       Precautions / Restrictions Precautions Precautions: Fall Precaution Comments: monitor sats Restrictions Weight Bearing Restrictions: No    Mobility  Bed Mobility Overal bed mobility: Modified Independent                Transfers Overall transfer level: Needs assistance Equipment used: None Transfers: Sit to/from Stand Sit to Stand: Min guard         General transfer comment: min guard for balance/safety  Ambulation/Gait   Ambulation Distance (Feet): 120 Feet Assistive device: None Gait Pattern/deviations: Step-through pattern;Decreased stride length     General Gait Details: SpO2 dropped to 85% on room air so applied 2L O2 and SpO2 improved to 94% also cues for pursed lip breathing, pt with dyspnea 3/4, pt also talking so recommended no talking during physical activity   Stairs            Wheelchair Mobility    Modified Rankin (Stroke Patients Only)        Balance                                    Cognition Arousal/Alertness: Awake/alert Behavior During Therapy: WFL for tasks assessed/performed Overall Cognitive Status: Within Functional Limits for tasks assessed                      Exercises      General Comments        Pertinent Vitals/Pain Pain Assessment: No/denies pain    Home Living                      Prior Function            PT Goals (current goals can now be found in the care plan section) Progress towards PT goals: Progressing toward goals    Frequency  Min 3X/week    PT Plan Current plan remains appropriate    Co-evaluation             End of Session Equipment Utilized During Treatment: Oxygen Activity Tolerance: Patient tolerated treatment well Patient left: with call bell/phone within reach;in bed;with family/visitor present     Time: 7829-5621 PT Time Calculation (min) (ACUTE ONLY): 10 min  Charges:  $Gait Training: 8-22 mins                    G Codes:  [name],[name] 11/22/2015, 1:10 PM Carmelia Bake, PT, DPT 11/22/2015 Pager: [phone]

## 2015-11-22 NOTE — Progress Notes (Signed)
Date:  November 22, 2015 Chart reviewed for concurrent status and case management needs. Will continue to follow patient for changes and needs: Velva Harman, BSN, [place], [place]   [phone]

## 2015-11-22 NOTE — Progress Notes (Signed)
Occupational Therapy Treatment Patient Details Name: [name] MRN: [id number] DOB: 1931/10/21 Today's Date: 11/22/2015    History of present illness 82 y.o. female with h/o DM, glaucoma, COPD, AVR, HTN admitted with dyspnea, cough. Dx of COPD exacerbation, bronchitis, AKI, sepsis.    OT comments  OT session focused on energy conservation strategies  Follow Up Recommendations  Supervision/Assistance - 24 hour    Equipment Recommendations  None recommended by OT    Recommendations for Other Services      Precautions / Restrictions Precautions Precautions: Fall Precaution Comments: monitor sats Restrictions Weight Bearing Restrictions: No       Mobility Bed Mobility Overal bed mobility: Modified Independent                Transfers Overall transfer level: Needs assistance Equipment used: None Transfers: Sit to/from Stand Sit to Stand: Min guard         General transfer comment: did not perform        ADL Overall ADL's : Needs assistance/impaired                                       General ADL Comments: OT session focused on energy conservation strategies.Handout provided.  Pt did feel several some of the ideas/tips pertinant.  Pt thankful for handout and feels it will be helpful.                Cognition   Behavior During Therapy: WFL for tasks assessed/performed Overall Cognitive Status: Within Functional Limits for tasks assessed                                    Pertinent Vitals/ Pain       Pain Assessment: No/denies pain         Frequency Min 2X/week     Progress Toward Goals  OT Goals(current goals can now be found in the care plan section)  Progress towards OT goals: Progressing toward goals     Plan Discharge plan remains appropriate    Co-evaluation                 End of Session     Activity Tolerance Patient tolerated treatment well   Patient Left in bed;with call  bell/phone within reach;with bed alarm set   Nurse Communication Mobility status        Time: 1258-1310 OT Time Calculation (min): 12 min  Charges: OT General Charges $OT Visit: 1 Procedure OT Treatments $Self Care/Home Management : 8-22 mins  Ugo Thoma, Edwena Felty D 11/22/2015, 1:46 PM

## 2015-11-22 NOTE — ED Provider Notes (Signed)
CSN: [id number]     Arrival date & time 11/18/15  1137 History   First MD Initiated Contact with Patient 11/18/15 1220     Chief Complaint  Patient presents with  . Shortness of Breath     (Consider location/radiation/quality/duration/timing/severity/associated sxs/prior Treatment) HPI   82 yo female with known COPD, aortic stenosis and s/p AVR, HTN, presents to [facility] ED with main concern of several days duration of progressive dyspnea that initially started with exertion and has progressed to dyspnea at rest in the past 24 hours, associated with mixed episodes of non productive and productive cough of clear and yellow sputum, chest tightness that is present with coughing spells, malaise, nausea, poor oral intake, several episodes of non bloody vomiting. Pt denies fevers, chills, recent sick contacts or exposures, no abd or urinary concerns.  Past Medical History  Diagnosis Date  . Hypertension   . Asthma     uses inhaler as needed  . Diabetes mellitus   . GERD (gastroesophageal reflux disease)   . Arthritis   . Anxiety   . Depression   . Hyperlipidemia   . Emphysema of lung ([place])   . Glaucoma   . [name]   . Aortic stenosis   . Murmur   . Nephrolithiasis    Past Surgical History  Procedure Laterality Date  . Ankle surgery  1998    d/t fx.  Left ankle  . Facial cosmetic surgery      face lift  . Nose surgery    . Laparoscopic assisted vaginal hysterectomy  06/21/2011    Procedure: LAPAROSCOPIC ASSISTED VAGINAL HYSTERECTOMY;  Surgeon: Maisie Fus;  Location: [facility];  Service: Gynecology;  Laterality: N/A;  . Salpingoophorectomy  06/21/2011    Procedure: SALPINGO OOPHERECTOMY;  Surgeon: Maisie Fus;  Location: [facility];  Service: Gynecology;  Laterality: Bilateral;  . Anterior and posterior repair  06/21/2011    Procedure: ANTERIOR (CYSTOCELE) AND POSTERIOR REPAIR (RECTOCELE);  Surgeon: Maisie Fus;  Location: [facility];  Service: Gynecology;  Laterality:  N/A;  . Vaginal prolapse repair  06/21/2011    Procedure: SACROPEXY;  Surgeon: Maisie Fus;  Location: [facility];  Service: Gynecology;  Laterality: N/A;  sacrospinous ligament suspension  . Cardiac catheterization    . Aortic valve replacement N/A 07/17/2013    Procedure: AORTIC VALVE REPLACEMENT (AVR);  Surgeon: Ivin Poot, MD;  Location: [facility];  Service: Open Heart Surgery;  Laterality: N/A;  . Coronary artery bypass graft N/A 07/17/2013    Procedure: CORONARY ARTERY BYPASS GRAFT, TIMES ONE, ON PUMP, USING RIGHT INTERNAL MAMMARY ARTERY.;  Surgeon: Ivin Poot, MD;  Location: [facility];  Service: Open Heart Surgery;  Laterality: N/A;  . Intraoperative transesophageal echocardiogram N/A 07/17/2013    Procedure: INTRAOPERATIVE TRANSESOPHAGEAL ECHOCARDIOGRAM;  Surgeon: Ivin Poot, MD;  Location: [facility];  Service: Open Heart Surgery;  Laterality: N/A;  . Abdominal hysterectomy    . Cardiac valve replacement    . Coronary angioplasty    . Left and right heart catheterization with coronary angiogram N/A 06/27/2013    Procedure: LEFT AND RIGHT HEART CATHETERIZATION WITH CORONARY ANGIOGRAM;  Surgeon: Ramond Dial, MD;  Location: [facility] CATH [facility];  Service: Cardiovascular;  Laterality: N/A;   Family History  Problem Relation Age of Onset  . Emphysema Father   . Colon cancer Neg Hx   . Esophageal cancer Neg Hx   . Rectal cancer Neg Hx   . Stomach cancer  Neg Hx    Social History  Substance Use Topics  . Smoking status: Former Smoker -- 2.00 packs/day for 15 years    Types: Cigarettes    Quit date: 09/18/1972  . Smokeless tobacco: Never Used  . Alcohol Use: No   OB History    No data available     Review of Systems  All systems reviewed and negative, other than as noted in HPI.   Allergies  Amaryl; Avelox; Ciprofloxacin; Lipitor; Lisinopril; Prevacid; Spiriva handihaler; and Symbicort  Home Medications   Prior to Admission medications   Medication Sig Start Date End Date  Taking? Authorizing Provider  acetaminophen (TYLENOL) 325 MG tablet Take 650 mg by mouth every 6 (six) hours as needed for moderate pain or headache.    Yes Historical Provider, MD  amLODipine (NORVASC) 10 MG tablet Take 1 tablet (10 mg total) by mouth daily. 10/19/15  Yes Lelon Perla, MD  aspirin 81 MG tablet Take 81 mg by mouth daily.   Yes Historical Provider, MD  cholecalciferol (VITAMIN D) 1000 UNITS tablet Take 1,000 Units by mouth daily.   Yes Historical Provider, MD  dextromethorphan (DELSYM) 30 MG/5ML liquid Take 30 mg by mouth every 4 (four) hours as needed for cough.   Yes Historical Provider, MD  fluticasone (FLOVENT HFA) 220 MCG/ACT inhaler Inhale 2 puffs into the lungs 2 (two) times daily.   Yes Historical Provider, MD  furosemide (LASIX) 20 MG tablet Take 1 tablet (20 mg total) by mouth every other day. 10/12/14  Yes Lelon Perla, MD  irbesartan (AVAPRO) 150 MG tablet Take 1 tablet (150 mg total) by mouth daily. 10/12/14  Yes Lelon Perla, MD  Magnesium 250 MG TABS Take 250 mg by mouth daily.    Yes Historical Provider, MD  Multiple Vitamins-Minerals (OCUVITE-LUTEIN PO) Take 1 tablet by mouth every morning.    Yes Historical Provider, MD  pravastatin (PRAVACHOL) 20 MG tablet Take 20 mg by mouth daily.   Yes Historical Provider, MD  PROAIR HFA 108 (90 BASE) MCG/ACT inhaler Inhale 2 puffs into the lungs 4 (four) times daily as needed for wheezing or shortness of breath.  02/24/14  Yes Historical Provider, MD  sertraline (ZOLOFT) 50 MG tablet Take 1 tablet (50 mg total) by mouth every morning. 08/05/13  Yes Daniel J Angiulli, PA-C  HYDROcodone-homatropine (HYCODAN) 5-1.5 MG/5ML syrup Take 5 mLs by mouth every 6 (six) hours as needed for cough. Patient not taking: Reported on 11/18/2015 03/15/15   Tanda Rockers, MD  mometasone-formoterol [facility]) 100-5 MCG/ACT AERO Take 2 puffs first thing in am and then another 2 puffs about 12 hours later. Patient not taking: Reported on 11/18/2015  04/08/15   Tammy S Parrett, NP   BP 166/70 mmHg  Pulse 82  Temp(Src) 97.9 F (36.6 C) (Oral)  Resp 22  Ht '5\' 8"'$  (1.727 m)  Wt 108 lb 7.5 oz (49.2 kg)  BMI 16.50 kg/m2  SpO2 96% Physical Exam  Constitutional: She appears well-developed and well-nourished. No distress.  HENT:  Head: Normocephalic and atraumatic.  Eyes: Conjunctivae are normal. Right eye exhibits no discharge. Left eye exhibits no discharge.  Neck: Neck supple.  Cardiovascular: Regular rhythm and normal heart sounds.  Exam reveals no gallop and no friction rub.   No murmur heard. Tachycardic  Pulmonary/Chest: She is in respiratory distress. She has wheezes.  Diffuse bilateral wheezing. Increased work of breathing. Tachypnea. Excision muscle usage.  Abdominal: Soft. She exhibits no distension. There is no  tenderness.  Musculoskeletal: She exhibits no edema or tenderness.  Lower extremities symmetric as compared to each other. No calf tenderness. Negative Homan's. No palpable cords.   Neurological: She is alert.  Skin: Skin is warm and dry.  Psychiatric: She has a normal mood and affect. Her behavior is normal. Thought content normal.  Nursing note and vitals reviewed.   ED Course  Procedures (including critical care time)  CRITICAL CARE Performed by: Virgel Manifold Total critical care time: 35 minutes Critical care time was exclusive of separately billable procedures and treating other patients. Critical care was necessary to treat or prevent imminent or life-threatening deterioration. Critical care was time spent personally by me on the following activities: development of treatment plan with patient and/or surrogate as well as nursing, discussions with consultants, evaluation of patient's response to treatment, examination of patient, obtaining history from patient or surrogate, ordering and performing treatments and interventions, ordering and review of laboratory studies, ordering and review of radiographic  studies, pulse oximetry and re-evaluation of patient's condition.  Labs Review Labs Reviewed  BASIC METABOLIC PANEL - Abnormal; Notable for the following:    CO2 21 (*)    Glucose, Bld 277 (*)    BUN 38 (*)    Creatinine, Ser 1.46 (*)    GFR calc non Af Amer 33 (*)    GFR calc Af Amer 38 (*)    All other components within normal limits  CBC - Abnormal; Notable for the following:    WBC 23.9 (*)    RBC 5.15 (*)    MCH 24.9 (*)    MCHC 29.8 (*)    RDW 16.3 (*)    All other components within normal limits  BRAIN NATRIURETIC PEPTIDE - Abnormal; Notable for the following:    B Natriuretic Peptide 248.3 (*)    All other components within normal limits  CBC WITH DIFFERENTIAL/PLATELET - Abnormal; Notable for the following:    WBC 21.7 (*)    Hemoglobin 11.1 (*)    MCH 25.1 (*)    RDW 16.3 (*)    Neutro Abs 20.3 (*)    Lymphs Abs 0.6 (*)    All other components within normal limits  LACTIC ACID, PLASMA - Abnormal; Notable for the following:    Lactic Acid, Venous 4.4 (*)    All other components within normal limits  PROTIME-INR - Abnormal; Notable for the following:    Prothrombin Time 15.3 (*)    All other components within normal limits  HEMOGLOBIN A1C - Abnormal; Notable for the following:    Hgb A1c MFr Bld 8.2 (*)    All other components within normal limits  STREP PNEUMONIAE URINARY ANTIGEN - Abnormal; Notable for the following:    Strep Pneumo Urinary Antigen POSITIVE (*)    All other components within normal limits  BLOOD GAS, ARTERIAL - Abnormal; Notable for the following:    pCO2 arterial 24.4 (*)    pO2, Arterial 72.0 (*)    Bicarbonate 14.1 (*)    Acid-base deficit 9.5 (*)    All other components within normal limits  LACTIC ACID, PLASMA - Abnormal; Notable for the following:    Lactic Acid, Venous 2.7 (*)    All other components within normal limits  GLUCOSE, CAPILLARY - Abnormal; Notable for the following:    Glucose-Capillary 459 (*)    All other components  within normal limits  CBC - Abnormal; Notable for the following:    WBC 21.5 (*)    Hemoglobin  10.4 (*)    HCT 34.5 (*)    MCH 25.1 (*)    RDW 16.5 (*)    All other components within normal limits  BASIC METABOLIC PANEL - Abnormal; Notable for the following:    CO2 20 (*)    Glucose, Bld 258 (*)    BUN 56 (*)    Creatinine, Ser 1.39 (*)    GFR calc non Af Amer 35 (*)    GFR calc Af Amer 40 (*)    All other components within normal limits  GLUCOSE, CAPILLARY - Abnormal; Notable for the following:    Glucose-Capillary 104 (*)    All other components within normal limits  GLUCOSE, CAPILLARY - Abnormal; Notable for the following:    Glucose-Capillary 216 (*)    All other components within normal limits  GLUCOSE, CAPILLARY - Abnormal; Notable for the following:    Glucose-Capillary 147 (*)    All other components within normal limits  GLUCOSE, CAPILLARY - Abnormal; Notable for the following:    Glucose-Capillary 249 (*)    All other components within normal limits  CBC - Abnormal; Notable for the following:    WBC 18.2 (*)    Hemoglobin 11.8 (*)    MCH 25.9 (*)    RDW 16.6 (*)    All other components within normal limits  GLUCOSE, CAPILLARY - Abnormal; Notable for the following:    Glucose-Capillary 229 (*)    All other components within normal limits  GLUCOSE, CAPILLARY - Abnormal; Notable for the following:    Glucose-Capillary 229 (*)    All other components within normal limits  GLUCOSE, CAPILLARY - Abnormal; Notable for the following:    Glucose-Capillary 307 (*)    All other components within normal limits  BASIC METABOLIC PANEL - Abnormal; Notable for the following:    CO2 20 (*)    Glucose, Bld 252 (*)    BUN 46 (*)    Creatinine, Ser 1.29 (*)    GFR calc non Af Amer 38 (*)    GFR calc Af Amer 44 (*)    All other components within normal limits  GLUCOSE, CAPILLARY - Abnormal; Notable for the following:    Glucose-Capillary 216 (*)    All other components within  normal limits  GLUCOSE, CAPILLARY - Abnormal; Notable for the following:    Glucose-Capillary 186 (*)    All other components within normal limits  I-STAT CG4 LACTIC ACID, ED - Abnormal; Notable for the following:    Lactic Acid, Venous 2.94 (*)    All other components within normal limits  I-STAT CG4 LACTIC ACID, ED - Abnormal; Notable for the following:    Lactic Acid, Venous 6.81 (*)    All other components within normal limits  CULTURE, BLOOD (ROUTINE X 2)  CULTURE, BLOOD (ROUTINE X 2)  RESPIRATORY VIRUS PANEL  MRSA PCR SCREENING  CULTURE, EXPECTORATED SPUTUM-ASSESSMENT  LACTIC ACID, PLASMA  PROCALCITONIN  APTT  LEGIONELLA ANTIGEN, URINE  INFLUENZA PANEL BY PCR (TYPE A & B, H1N1)  I-STAT TROPOININ, ED    Imaging Review Dg Chest Port 1 View  11/21/2015  CLINICAL DATA:  Hypoxia, pneumonia EXAM: PORTABLE CHEST 1 VIEW COMPARISON:  CT chest dated 11/18/2015 FINDINGS: Mild patchy opacities at the medial right lung base, and to a lesser extent the left lung base, corresponding to right middle lobe, lingular, and medial right lower lobe pneumonia on recent CT. Mild blunting of the left costophrenic angle.  No pneumothorax. Cardiomegaly. Prosthetic aortic valve.  Postsurgical changes related to prior CABG. Median sternotomy. IMPRESSION: Mild patchy opacities at the lung bases, corresponding to known multifocal pneumonia on CT. Electronically Signed   By: Julian Hy M.D.   On: 11/21/2015 16:46   I have personally reviewed and evaluated these images and lab results as part of my medical decision-making.   EKG Interpretation   Date/Time:  Thursday November 18 2015 11:46:57 EST Ventricular Rate:  96 PR Interval:  131 QRS Duration: 134 QT Interval:  382 QTC Calculation: 483 R Axis:   -77 Text Interpretation:  Sinus tachycardia with irregular rate Right bundle  branch block ST elevation, consider lateral injury ED PHYSICIAN  INTERPRETATION AVAILABLE IN [facility] Confirmed by  TEST, Record  ([id number]) on 11/19/2015 7:22:46 AM      MDM   Final diagnoses:  Acute respiratory failure with hypoxia (HCC)  COPD exacerbation ([place])    82 year old female with respiratory distress. Increasing oxygen requirement. Treated with an hour-long nap some improvement but still is experiencing some degree of distress. Empiric antibiotics for possible pneumonia. Requesting submission for ongoing treatment.    Virgel Manifold, MD 11/22/15 [phone]

## 2015-11-22 NOTE — Care Management Important Message (Signed)
Important Message  Patient Details  Name: [name] MRN: [id number] Date of Birth: 1932/07/17   Medicare Important Message Given:  Yes    Camillo Flaming 11/22/2015, 4:44 PMImportant Message  Patient Details  Name: [name] MRN: [id number] Date of Birth: 01-05-1932   Medicare Important Message Given:  Yes    Camillo Flaming 11/22/2015, 4:43 PM

## 2015-11-22 NOTE — Progress Notes (Signed)
SATURATION QUALIFICATIONS: (This note is used to comply with regulatory documentation for home oxygen)  Patient Saturations on Room Air at Rest = 94%  Patient Saturations on Room Air while Ambulating = 85%  Patient Saturations on 2 Liters of oxygen while Ambulating = 94%  Please briefly explain why patient needs home oxygen: to maintain saturations above 88% during physical activity such as ambulation.

## 2015-11-23 LAB — CULTURE, BLOOD (ROUTINE X 2)
CULTURE: NO GROWTH
CULTURE: NO GROWTH

## 2015-11-23 LAB — GLUCOSE, CAPILLARY
GLUCOSE-CAPILLARY: 111 mg/dL — AB (ref 65–99)
GLUCOSE-CAPILLARY: 127 mg/dL — AB (ref 65–99)
Glucose-Capillary: 60 mg/dL — ABNORMAL LOW (ref 65–99)

## 2015-11-23 MED ORDER — ALBUTEROL SULFATE (2.5 MG/3ML) 0.083% IN NEBU: 2.5000 mg | INHALATION_SOLUTION | Freq: Four times a day (QID) | RESPIRATORY_TRACT | Status: DC | PRN

## 2015-11-23 MED ORDER — AMOXICILLIN-POT CLAVULANATE 875-125 MG PO TABS: 1.0000 | ORAL_TABLET | Freq: Two times a day (BID) | ORAL | Status: DC

## 2015-11-23 MED ORDER — FAMOTIDINE 20 MG PO TABS: 20.0000 mg | ORAL_TABLET | Freq: Every day | ORAL | Status: DC

## 2015-11-23 MED ORDER — METFORMIN HCL 500 MG PO TABS: 500.0000 mg | ORAL_TABLET | Freq: Every day | ORAL | Status: DC

## 2015-11-23 MED ORDER — PREDNISONE 10 MG PO TABS: ORAL_TABLET | ORAL | Status: DC

## 2015-11-23 NOTE — Discharge Summary (Signed)
[name], is a 82 y.o. female  DOB 12-Jun-1932  MRN [id number].  Admission date:  11/18/2015  Admitting Physician  Desiree Blaze, MD  Discharge Date:  11/23/2015   Primary MD  Desiree Schwalbe, MD  Recommendations for primary care physician for things to follow:  - Please check CBC, BMP during next visit repeat 2 view chest x-ray in 2 weeks - Patient had ambulatory pulmonary rehabilitation referral done - Started on metformin for hyperglycemia and elevated glycohemoglobin, please adjust dose as needed   Admission Diagnosis  COPD exacerbation ([place]) [J44.1] Acute respiratory failure with hypoxia ([place]) [J96.01]   Discharge Diagnosis  COPD exacerbation ([place]) [J44.1] Acute respiratory failure with hypoxia ([place]) [J96.01]    Principal Problem:   COPD exacerbation ([place]) Active Problems:   COPD GOLD II    Essential hypertension   Sepsis ([place])   Acute kidney injury ([place])   Hyperglycemia      Past Medical History  Diagnosis Date  . Hypertension   . Asthma     uses inhaler as needed  . Diabetes mellitus   . GERD (gastroesophageal reflux disease)   . Arthritis   . Anxiety   . Depression   . Hyperlipidemia   . Emphysema of lung ([place])   . Glaucoma   . [name]   . Aortic stenosis   . Murmur   . Nephrolithiasis     Past Surgical History  Procedure Laterality Date  . Ankle surgery  1998    d/t fx.  Left ankle  . Facial cosmetic surgery      face lift  . Nose surgery    . Laparoscopic assisted vaginal hysterectomy  06/21/2011    Procedure: LAPAROSCOPIC ASSISTED VAGINAL HYSTERECTOMY;  Surgeon: [name];  Location: [facility];  Service: Gynecology;  Laterality: N/A;  . Salpingoophorectomy  06/21/2011    Procedure: SALPINGO OOPHERECTOMY;  Surgeon: [name];  Location: [facility];  Service: Gynecology;  Laterality: Bilateral;  . Anterior and posterior repair  06/21/2011      Procedure: ANTERIOR (CYSTOCELE) AND POSTERIOR REPAIR (RECTOCELE);  Surgeon: [name];  Location: [facility];  Service: Gynecology;  Laterality: N/A;  . Vaginal prolapse repair  06/21/2011    Procedure: SACROPEXY;  Surgeon: [name];  Location: [facility];  Service: Gynecology;  Laterality: N/A;  sacrospinous ligament suspension  . Cardiac catheterization    . Aortic valve replacement N/A 07/17/2013    Procedure: AORTIC VALVE REPLACEMENT (AVR);  Surgeon: Desiree Poot, MD;  Location: [facility];  Service: Open Heart Surgery;  Laterality: N/A;  . Coronary artery bypass graft N/A 07/17/2013    Procedure: CORONARY ARTERY BYPASS GRAFT, TIMES ONE, ON PUMP, USING RIGHT INTERNAL MAMMARY ARTERY.;  Surgeon: Desiree Poot, MD;  Location: [facility];  Service: Open Heart Surgery;  Laterality: N/A;  . Intraoperative transesophageal echocardiogram N/A 07/17/2013    Procedure: INTRAOPERATIVE TRANSESOPHAGEAL ECHOCARDIOGRAM;  Surgeon: Desiree Poot, MD;  Location: [facility];  Service: Open Heart Surgery;  Laterality: N/A;  . Abdominal hysterectomy    . Cardiac valve replacement    . Coronary angioplasty    . Left and right heart catheterization with coronary angiogram N/A 06/27/2013    Procedure: LEFT AND RIGHT HEART CATHETERIZATION WITH CORONARY ANGIOGRAM;  Surgeon: Desiree Dial, MD;  Location: [facility] CATH LAB;  Service: Cardiovascular;  Laterality: N/A;       History of present illness and  Hospital Course:     Kindly see H&P for history of present illness and admission details, please review complete Labs, Consult reports and Test reports for all details in brief  HPI  from the history and physical done on the day of admission 11/18/2015 Pt is 82 yo female with known COPD, aortic stenosis and Strong/p AVR, HTN, presents to [facility] ED with main concern of several days duration of progressive dyspnea that initially started with exertion and has progressed to dyspnea at rest in the past 24 hours, associated with mixed  episodes of non productive and productive cough of clear and yellow sputum, chest tightness that is present with coughing spells, malaise, nausea, poor oral intake, several episodes of non bloody vomiting. Pt denies fevers, chills, recent sick contacts or exposures, no abd or urinary concerns.  In ED, pt noted to be hemodynamically stable, VS notable for HR up to 118, RR up to 28, oxygen saturation 81% on RA requiring placement on 6 L oxygen via [place]. Blood work notable for WBC 23 K, Cr 1.46, Cr 2.94. CXR with stable emphysema. TRH asked to admit for further evaluation.    Hospital Course   Acute hypoxic respiratory failure - Multifactorial, secondary to COPD exacerbation and mural 20 acquired pneumonia, no evidence of CO2 retention, no indication for BiPAP hospital stay, improving , Ms. hypoxic on ambulation, home oxygen ranged on discharge.  COPD exacerbation ([place]), GOLD II - with significant wheezing on admission, hypoxia, appears to be on improving on IV Solu-Medrol, appeared gradually, started on by mouth prednisone, as well as arrange for home nebulizer. - Ambulatory referral for pulmonary rehabilitation  Community acquired pneumonia - T2-weighted with Rocephin and azithromycin during hospital stay. - CT chest significant for multifocal pneumonia - Influenza negative - Culture remains negative at time of discharge, respiratory virus panel remains negative - Leukocytosis most likely related to steroids , afebrile    Acute kidney injury ([place]) -Old with IV fluids   Essential hypertension - continue Norvasc and Avapro on discharge   Hyperglycemia/diabetes mellitus  - hemoglobin A1c is 8.2, ABG uncontrolled during hospital stay most likely due to steroids, started on low-dose metformin 500 mg oral daily on discharge.     Discharge Condition:  stable   Follow UP  Follow-up Information    Follow up with Desiree S, MD. Schedule an appointment as soon as possible for a  visit in 1 week.   Specialty:  Family Medicine   Contact information:   [place] [address] [phone]       Follow up with [facility].   Why:  home oxygen and nebulizer machine   Contact information:   [place] [address] [phone]         Discharge Instructions  and  Discharge Medications     Discharge Instructions    AMB referral to pulmonary rehabilitation    Complete by:  As directed   Please select a program:  Pulmonary Rehabilitation (COPD)  COPD Diagnosis: (  See requirements below):  COPD-Gold 2  Program Prescription:  O2 Administration by RT, EP, or RN if SpO2<90%     Discharge instructions    Complete by:  As directed   Follow with Primary MD Desiree Schwalbe, MD in 7 days   Get CBC, CMP, 2 view Chest X ray checked  by Primary MD next visit.    Activity: As tolerated with Full fall precautions use walker/cane & assistance as needed   Disposition Home    Diet: Heart Healthy , Carbohydrate modified , with feeding assistance and aspiration precautions.  For Heart failure patients - Check your Weight same time everyday, if you gain over 2 pounds, or you develop in leg swelling, experience more shortness of breath or chest pain, call your Primary MD immediately. Follow Cardiac Low Salt Diet and 1.5 lit/day fluid restriction.   On your next visit with your primary care physician please Get Medicines reviewed and adjusted.   Please request your Prim.MD to go over all Hospital Tests and Procedure/Radiological results at the follow up, please get all Hospital records sent to your Prim MD by signing hospital release before you go home.   If you experience worsening of your admission symptoms, develop shortness of breath, life threatening emergency, suicidal or homicidal thoughts you must seek medical attention immediately by calling 911 or calling your MD immediately  if symptoms less  severe.  You Must read complete instructions/literature along with all the possible adverse reactions/side effects for all the Medicines you take and that have been prescribed to you. Take any new Medicines after you have completely understood and accpet all the possible adverse reactions/side effects.   Do not drive, operating heavy machinery, perform activities at heights, swimming or participation in water activities or provide baby sitting services if your were admitted for syncope or siezures until you have seen by Primary MD or a Neurologist and advised to do so again.  Do not drive when taking Pain medications.    Do not take more than prescribed Pain, Sleep and Anxiety Medications  Special Instructions: If you have smoked or chewed Tobacco  in the last 2 yrs please stop smoking, stop any regular Alcohol  and or any Recreational drug use.  Wear Seat belts while driving.   Please note  You were cared for by a hospitalist during your hospital stay. If you have any questions about your discharge medications or the care you received while you were in the hospital after you are discharged, you can call the unit and asked to speak with the hospitalist on call if the hospitalist that took care of you is not available. Once you are discharged, your primary care physician will handle any further medical issues. Please note that NO REFILLS for any discharge medications will be authorized once you are discharged, as it is imperative that you return to your primary care physician (or establish a relationship with a primary care physician if you do not have one) for your aftercare needs so that they can reassess your need for medications and monitor your lab values.     Increase activity slowly    Complete by:  As directed             Medication List    TAKE these medications        amLODipine 10 MG tablet  Commonly known as:  NORVASC  Take 1 tablet (10 mg total) by mouth daily.      amoxicillin-clavulanate 875-125 MG tablet  Commonly known as:  AUGMENTIN  Take 1 tablet by mouth 2 (two) times daily.     aspirin 81 MG tablet  Take 81 mg by mouth daily.     cholecalciferol 1000 units tablet  Commonly known as:  VITAMIN D  Take 1,000 Units by mouth daily.     DELSYM 30 MG/5ML liquid  Generic drug:  dextromethorphan  Take 30 mg by mouth every 4 (four) hours as needed for cough.     famotidine 20 MG tablet  Commonly known as:  PEPCID  Take 1 tablet (20 mg total) by mouth daily.     fluticasone 220 MCG/ACT inhaler  Commonly known as:  FLOVENT HFA  Inhale 2 puffs into the lungs 2 (two) times daily.     furosemide 20 MG tablet  Commonly known as:  LASIX  Take 1 tablet (20 mg total) by mouth every other day.     HYDROcodone-homatropine 5-1.5 MG/5ML syrup  Commonly known as:  HYCODAN  Take 5 mLs by mouth every 6 (six) hours as needed for cough.     irbesartan 150 MG tablet  Commonly known as:  AVAPRO  Take 1 tablet (150 mg total) by mouth daily.     Magnesium 250 MG Tabs  Take 250 mg by mouth daily.     metFORMIN 500 MG tablet  Commonly known as:  GLUCOPHAGE  Take 1 tablet (500 mg total) by mouth daily with breakfast.     mometasone-formoterol 100-5 MCG/ACT Aero  Commonly known as:  DULERA  Take 2 puffs first thing in am and then another 2 puffs about 12 hours later.     OCUVITE-LUTEIN PO  Take 1 tablet by mouth every morning.     pravastatin 20 MG tablet  Commonly known as:  PRAVACHOL  Take 20 mg by mouth daily.     predniSONE 10 MG tablet  Commonly known as:  DELTASONE  Please take 40 mg oral daily for 3 days, then 30 mg oral daily for 3 days, then 20 mg oral daily for 3 days, then 10 mg orally daily for 3 days then stop     PROAIR HFA 108 (90 Base) MCG/ACT inhaler  Generic drug:  albuterol  Inhale 2 puffs into the lungs 4 (four) times daily as needed for wheezing or shortness of breath.     albuterol (2.5 MG/3ML) 0.083% nebulizer solution    Commonly known as:  PROVENTIL  Take 3 mLs (2.5 mg total) by nebulization every 6 (six) hours as needed for wheezing.     sertraline 50 MG tablet  Commonly known as:  ZOLOFT  Take 1 tablet (50 mg total) by mouth every morning.     TYLENOL 325 MG tablet  Generic drug:  acetaminophen  Take 650 mg by mouth every 6 (six) hours as needed for moderate pain or headache.          Diet and Activity recommendation: See Discharge Instructions above   Consults obtained -  none   Major procedures and Radiology Reports - PLEASE review detailed and final reports for all details, in brief -      Dg Chest 2 View  11/18/2015  CLINICAL DATA:  Shortness of breath. EXAM: CHEST  2 VIEW COMPARISON:  July 20, 2014. FINDINGS: Stable cardiomediastinal silhouette. Sternotomy wires are noted. Emphysematous disease is noted in both upper lobes. No pneumothorax or pleural effusion is noted. Atherosclerosis of thoracic aorta is noted. Multilevel degenerative disc disease is noted throughout the thoracic spine.  Status post aortic valve replacement. Stable mild bibasilar scarring is noted. IMPRESSION: Stable emphysematous disease seen in both upper lobes with stable mild bibasilar scarring. Electronically Signed   By: Marijo Conception, M.D.   On: 11/18/2015 12:42   Ct Chest Wo Contrast  11/18/2015  CLINICAL DATA:  Acute onset of progressive dyspnea, with productive cough and chest tightness. Malaise and nausea. Vomiting. Initial encounter. EXAM: CT CHEST WITHOUT CONTRAST TECHNIQUE: Multidetector CT imaging of the chest was performed following the standard protocol without IV contrast. COMPARISON:  Chest radiograph performed earlier today at 12:34 p.m., and CT of the chest performed 01/18/2015 FINDINGS: Patchy bibasilar airspace opacities are noted, compatible with multifocal pneumonia. Underlying diffuse emphysematous change is noted, most prominent at the upper lung lobes. No pleural effusion or pneumothorax is  seen. No suspicious masses are identified. Bilateral bronchomalacia is noted, more prominent on the right. Scattered calcification is noted along the thoracic aorta and proximal great vessels. Calcification is noted at the aortic valve. Diffuse coronary artery calcifications are seen. The patient is status post median sternotomy. No definite mediastinal lymphadenopathy is appreciated. No pericardial effusion is identified. Note is made of a moderate hiatal hernia. The thyroid gland is unremarkable. No axillary lymphadenopathy is seen. The visualized portions of the liver and spleen are grossly unremarkable. Scattered calcification is noted along the proximal abdominal aorta and its branches. The visualized portions of the adrenal glands and kidneys are grossly unremarkable, with mild nonspecific perinephric stranding noted bilaterally. No acute osseous abnormalities are identified. There is chronic compression deformity involving the superior endplate of vertebral body T12. IMPRESSION: 1. Patchy bibasilar airspace opacities, compatible with multifocal pneumonia. 2. Underlying diffuse emphysematous change, most prominent at the upper lung lobes. 3. Bilateral bronchomalacia, more prominent on the right. 4. Calcification at the aortic valve. Diffuse coronary artery calcifications seen. 5. Moderate hiatal hernia noted. 6. Scattered calcification along the proximal abdominal aorta and its branches. Electronically Signed   By: Garald Balding M.D.   On: 11/18/2015 23:15   Dg Chest Port 1 View  11/21/2015  CLINICAL DATA:  Hypoxia, pneumonia EXAM: PORTABLE CHEST 1 VIEW COMPARISON:  CT chest dated 11/18/2015 FINDINGS: Mild patchy opacities at the medial right lung base, and to a lesser extent the left lung base, corresponding to right middle lobe, lingular, and medial right lower lobe pneumonia on recent CT. Mild blunting of the left costophrenic angle.  No pneumothorax. Cardiomegaly. Prosthetic aortic valve. Postsurgical  changes related to prior CABG. Median sternotomy. IMPRESSION: Mild patchy opacities at the lung bases, corresponding to known multifocal pneumonia on CT. Electronically Signed   By: Julian Hy M.D.   On: 11/21/2015 16:46    Micro Results     Recent Results (from the past 240 hour(Strong))  Culture, blood (x 2)     Status: None (Preliminary result)   Collection Time: 11/18/15  4:50 PM  Result Value Ref Range Status   Specimen Description BLOOD LEFT ARM  Final   Special Requests BOTTLES DRAWN AEROBIC AND ANAEROBIC 5 ML  Final   Culture   Final    NO GROWTH 4 DAYS Performed at [facility]    Report Status PENDING  Incomplete  Culture, blood (x 2)     Status: None (Preliminary result)   Collection Time: 11/18/15  4:53 PM  Result Value Ref Range Status   Specimen Description BLOOD RIGHT ARM  Final   Special Requests BOTTLES DRAWN AEROBIC AND ANAEROBIC 5CC  Final   Culture  Final    NO GROWTH 4 DAYS Performed at [facility]    Report Status PENDING  Incomplete  Respiratory virus panel     Status: None   Collection Time: 11/18/15  7:45 PM  Result Value Ref Range Status   Source - RVPAN NOSE  Corrected   Respiratory Syncytial Virus A Negative Negative Final   Respiratory Syncytial Virus B Negative Negative Final   Influenza A Negative Negative Final   Influenza B Negative Negative Final   Parainfluenza 1 Negative Negative Final   Parainfluenza 2 Negative Negative Final   Parainfluenza 3 Negative Negative Final   Metapneumovirus Negative Negative Final   Rhinovirus Negative Negative Final   Adenovirus Negative Negative Final    Comment: (NOTE) Performed At: [facility] [place], [place] [id number] Lindon Romp MD [id number]   MRSA PCR Screening     Status: None   Collection Time: 11/18/15 10:12 PM  Result Value Ref Range Status   MRSA by PCR NEGATIVE NEGATIVE Final    Comment:        The GeneXpert MRSA Assay (FDA approved  for NASAL specimens only), is one component of a comprehensive MRSA colonization surveillance program. It is not intended to diagnose MRSA infection nor to guide or monitor treatment for MRSA infections.        Today   Subjective:   Anamari Galeas today has no headache,no chest or  abdominal pain,no new weakness tingling or numbness, feels much better today. Cough is improving.  Objective:   Blood pressure 155/73, pulse 90, temperature 98.4 F (36.9 C), temperature source Oral, resp. rate 18, height '5\' 8"'$  (1.727 m), weight 49.2 kg (108 lb 7.5 oz), SpO2 97 %.   Intake/Output Summary (Last 24 hours) at 11/23/15 1258 Last data filed at 11/23/15 0800  Gross per 24 hour  Intake    240 ml  Output      0 ml  Net    240 ml    Exam Awake Alert, Oriented X 3,  [place].AT,PERRAL Supple Neck,No JVD, No cervical lymphadenopathy appriciated.  Symmetrical Chest wall movement, Good air movement bilaterally, no wheezing(this received nebulizer treatment prior exam) RRR,No Gallops,Rubs or new Murmurs, No Parasternal Heave +ve B.Sounds, Abd Soft, No tenderness, No organomegaly appriciated, No rebound - guarding or rigidity. No Cyanosis, Clubbing or edema, No new Rash or bruise Data Review   CBC w Diff: Lab Results  Component Value Date   WBC 18.2* 11/21/2015   HGB 11.8* 11/21/2015   HCT 38.3 11/21/2015   PLT 324 11/21/2015   LYMPHOPCT 3 11/18/2015   MONOPCT 4 11/18/2015   EOSPCT 0 11/18/2015   BASOPCT 0 11/18/2015    CMP: Lab Results  Component Value Date   NA 140 11/22/2015   K 4.9 11/22/2015   CL 111 11/22/2015   CO2 20* 11/22/2015   BUN 46* 11/22/2015   CREATININE 1.29* 11/22/2015   CREATININE 1.18* 04/30/2014   PROT 6.2 07/25/2013   ALBUMIN 2.8* 07/25/2013   BILITOT 0.2* 07/25/2013   ALKPHOS 63 07/25/2013   AST 28 07/25/2013   ALT 27 07/25/2013  .   Total Time in preparing paper work, data evaluation and todays exam - 35 minutes  ELGERGAWY, DAWOOD M.D on  11/23/2015 at 12:58 PM  [facility]   Office  [phone]

## 2015-11-23 NOTE — Progress Notes (Signed)
Nutrition Follow-up  DOCUMENTATION CODES:   Non-severe (moderate) malnutrition in context of acute illness/injury  INTERVENTION:  - Continue Carb Modified diet - RD will continue to monitor for needs  NUTRITION DIAGNOSIS:   Malnutrition related to acute illness as evidenced by energy intake < 75% for > 7 days, moderate depletion of body fat, moderate depletions of muscle mass. -ongoing  GOAL:   Patient will meet greater than or equal to 90% of their needs -met at this time  MONITOR:   PO intake, Labs, I & O's, Skin  ASSESSMENT:   Pt is 82 yo female with known COPD, aortic stenosis and s/p AVR, HTN, presents to [facility] ED with main concern of several days duration of progressive dyspnea that initially started with exertion and has progressed to dyspnea at rest in the past 24 hours  3/7 Per chart review, pt eating 75-100% since previous assessment. Pt states that she ate a hamburger for lunch without issue. Pt denies any nutrition-related needs or questions at this time. Pt likely meeting needs on average. Pt states that she was told she may d/c home today. Medications reviewed. Labs reviewed; HgbA1c: 8.2%, CBGs: 60-234 mg/dL since yesterday (3/6) AM, BUN/creatinine elevated but trending down, GFR: 38.    3/3 - Spoke with pt at bedside.  - Prior to admission appetite was down for at least a week.  - Stated she would eat half a biscuit in the morning, her and her husband would split whatever they have for lunch, and then for dinner she would normally eat half a peanut butter sandwich. - She also endorses weight loss of 10 lbs (from 117 lbs to 107 lbs) in 1 week.  - This would indicate 8.5% wt loss which is significant for that time frame. - Nutrition-Focused physical exam completed.  - Findings are mild fat depletion, moderate muscle depletion, and no edema.  - During stay, she is eating much, per her report. No PO intake in chart. - She has Ensure at home but does not consume it and  denies offer for supplement order during admission.   Diet Order:  Diet Carb Modified Fluid consistency:: Thin; Room service appropriate?: Yes  Skin:  Wound (see comment) (Ecchymosis to R A)  Last BM:  3/6  Height:   Ht Readings from Last 1 Encounters:  11/19/15 5' 8"  (1.727 m)    Weight:   Wt Readings from Last 1 Encounters:  11/19/15 108 lb 7.5 oz (49.2 kg)    Ideal Body Weight:  45.45 kg  BMI:  Body mass index is 16.5 kg/(m^2).  Estimated Nutritional Needs:   Kcal:  1550-1750  Protein:  50-62 grams  Fluid:  >/= 1.5L  EDUCATION NEEDS:   No education needs identified at this time     Jarome Matin, RD, LDN Inpatient Clinical Dietitian Pager # [phone] After hours/weekend pager # [phone]

## 2015-11-23 NOTE — Progress Notes (Signed)
Discharge instructions given to pt, verbalized understanding. Left the unit in stable condition. 

## 2015-11-23 NOTE — Care Management Note (Signed)
Case Management Note  Patient Details  Name: [name] MRN: [id number] Date of Birth: [birth year]  Subjective/Objective:                  Shortness of Breath Action/Plan: Discharge planning Expected Discharge Date:  11/23/15               Expected Discharge Plan:  Home/Self Care  In-House Referral:  NA  Discharge planning Services  CM Consult  Post Acute Care Choice:  NA Choice offered to:  NA  DME Arranged:  Oxygen, Nebulizer machine DME Agency:  NA, [place] Arranged:  NA HH Agency:  NA  Status of Service:  Completed, signed off  Medicare Important Message Given:  Yes Date Medicare IM Given:    Medicare IM give by:    Date Additional Medicare IM Given:    Additional Medicare Important Message give by:     If discussed at [place] of Stay Meetings, dates discussed:    Additional Comments: CM received call from MD alerting of pending discharge.  CM called [facility] DME rep, Lecretia to please deliver the home O2 tank and nebulizer machine so pt can be discharged.  No HH services were recommended or ordered.  No other CM needs were communicated. Dellie Catholic, RN 11/23/2015, 12:55 PM

## 2015-11-23 NOTE — Progress Notes (Signed)
[facility]    [facility] is providing the following services: home oxygen and nebulizer.  (nebulizer to be delivered to the home with oxygen) travel tank delivered to the hospital room for transport home.  If patient discharges after hours, please call [phone].   Linward Headland 11/23/2015, 1:36 PM

## 2015-11-23 NOTE — Discharge Instructions (Signed)
Follow with Primary MD [name], MD in 7 days   Get CBC, CMP, 2 view Chest X ray checked  by Primary MD next visit.    Activity: As tolerated with Full fall precautions use walker/cane & assistance as needed   Disposition Home    Diet: Heart Healthy , Carbohydrate modified , with feeding assistance and aspiration precautions.  For Heart failure patients - Check your Weight same time everyday, if you gain over 2 pounds, or you develop in leg swelling, experience more shortness of breath or chest pain, call your Primary MD immediately. Follow Cardiac Low Salt Diet and 1.5 lit/day fluid restriction.   On your next visit with your primary care physician please Get Medicines reviewed and adjusted.   Please request your Prim.MD to go over all Hospital Tests and Procedure/Radiological results at the follow up, please get all Hospital records sent to your Prim MD by signing hospital release before you go home.   If you experience worsening of your admission symptoms, develop shortness of breath, life threatening emergency, suicidal or homicidal thoughts you must seek medical attention immediately by calling 911 or calling your MD immediately  if symptoms less severe.  You Must read complete instructions/literature along with all the possible adverse reactions/side effects for all the Medicines you take and that have been prescribed to you. Take any new Medicines after you have completely understood and accpet all the possible adverse reactions/side effects.   Do not drive, operating heavy machinery, perform activities at heights, swimming or participation in water activities or provide baby sitting services if your were admitted for syncope or siezures until you have seen by Primary MD or a Neurologist and advised to do so again.  Do not drive when taking Pain medications.    Do not take more than prescribed Pain, Sleep and Anxiety Medications  Special Instructions: If you have smoked  or chewed Tobacco  in the last 2 yrs please stop smoking, stop any regular Alcohol  and or any Recreational drug use.  Wear Seat belts while driving.   Please note  You were cared for by a hospitalist during your hospital stay. If you have any questions about your discharge medications or the care you received while you were in the hospital after you are discharged, you can call the unit and asked to speak with the hospitalist on call if the hospitalist that took care of you is not available. Once you are discharged, your primary care physician will handle any further medical issues. Please note that NO REFILLS for any discharge medications will be authorized once you are discharged, as it is imperative that you return to your primary care physician (or establish a relationship with a primary care physician if you do not have one) for your aftercare needs so that they can reassess your need for medications and monitor your lab values.

## 2015-11-30 ENCOUNTER — Other Ambulatory Visit: Payer: Self-pay | Admitting: Family Medicine

## 2015-11-30 ENCOUNTER — Ambulatory Visit
Admission: RE | Admit: 2015-11-30 | Discharge: 2015-11-30 | Disposition: A | Discharge status: Home / Self Care | Payer: Medicare Other | Source: Ambulatory Visit | Attending: Family Medicine | Admitting: Family Medicine

## 2015-11-30 DIAGNOSIS — E041 Nontoxic single thyroid nodule: Secondary | ICD-10-CM

## 2015-11-30 DIAGNOSIS — J189 Pneumonia, unspecified organism: Secondary | ICD-10-CM

## 2015-12-09 ENCOUNTER — Ambulatory Visit
Admission: RE | Admit: 2015-12-09 | Discharge: 2015-12-09 | Disposition: A | Discharge status: Home / Self Care | Payer: Medicare Other | Source: Ambulatory Visit | Attending: Family Medicine | Admitting: Family Medicine

## 2015-12-09 DIAGNOSIS — E041 Nontoxic single thyroid nodule: Secondary | ICD-10-CM

## 2015-12-28 ENCOUNTER — Ambulatory Visit (INDEPENDENT_AMBULATORY_CARE_PROVIDER_SITE_OTHER): Payer: Medicare Other | Admitting: Podiatry

## 2015-12-28 ENCOUNTER — Encounter: Payer: Self-pay | Admitting: Podiatry

## 2015-12-28 DIAGNOSIS — M79673 Pain in unspecified foot: Secondary | ICD-10-CM

## 2015-12-28 DIAGNOSIS — B351 Tinea unguium: Secondary | ICD-10-CM | POA: Diagnosis not present

## 2015-12-28 DIAGNOSIS — M79609 Pain in unspecified limb: Principal | ICD-10-CM

## 2015-12-28 NOTE — Progress Notes (Signed)
   Subjective:    Patient ID: [name], female    DOB: 01-Mar-1932, 82 y.o.   MRN: [id number]  HPI this patient presents to the office with chief complaint of painful toenails on both feet. He says her nails have grown thick and long, especially since she spent time in the hospital. She says her nails appear to be ingrown on the second and third toes of the left foot. She denies any drainage from the sites. She presents the office for evaluation and treatment of her nails The patient presents here today with b/l toenail debridement and a possible ingrown left 2nd and 3rd toenails.  Review of Systems  All other systems reviewed and are negative.      Objective:   Physical Exam GENERAL APPEARANCE: Alert, conversant. Appropriately groomed. No acute distress.  VASCULAR: Pedal pulses palpable at  [facility] and PT bilateral.  Capillary refill time is immediate to all digits,  Normal temperature gradient.  Digital hair growth is present bilateral  NEUROLOGIC: sensation is normal to 5.07 monofilament at 5/5 sites bilateral.  Light touch is intact bilateral, Muscle strength normal.  MUSCULOSKELETAL: acceptable muscle strength, tone and stability bilateral.  Intrinsic muscluature intact bilateral.  Rectus appearance of foot and digits noted bilateral.   DERMATOLOGIC: skin color, texture, and turgor are within normal limits.  No preulcerative lesions or ulcers  are seen, no interdigital maceration noted.  No open lesions present.  No drainage noted.  Thick disfigured discolored ingrown toenails both feet.         Assessment & Plan:  Onychomycosis B/L   Debridement and grinding of nails both feet.  Gardiner Barefoot DPM

## 2016-03-10 ENCOUNTER — Encounter: Payer: Self-pay | Admitting: Podiatry

## 2016-03-10 ENCOUNTER — Ambulatory Visit (INDEPENDENT_AMBULATORY_CARE_PROVIDER_SITE_OTHER): Payer: Medicare Other | Admitting: Podiatry

## 2016-03-10 DIAGNOSIS — M79676 Pain in unspecified toe(s): Secondary | ICD-10-CM

## 2016-03-10 DIAGNOSIS — B351 Tinea unguium: Secondary | ICD-10-CM

## 2016-03-10 DIAGNOSIS — M79609 Pain in unspecified limb: Principal | ICD-10-CM

## 2016-03-10 NOTE — Progress Notes (Signed)
   Subjective:    Patient ID: [name], female    DOB: 05/30/1932, 82 y.o.   MRN: [id number]  HPI this patient presents to the office with chief complaint of painful toenails on both feet. He says her nails have grown thick and long, especially since she spent time in the hospital. She says her nails appear to be ingrown on the second and third toes of the left foot. She denies any drainage from the sites. She presents the office for evaluation and treatment of her nails The patient presents here today with b/l toenail debridement and a possible ingrown left 2nd and 3rd toenails.  Review of Systems  All other systems reviewed and are negative.      Objective:   Physical Exam GENERAL APPEARANCE: Alert, conversant. Appropriately groomed. No acute distress.  VASCULAR: Pedal pulses palpable at  [facility] and PT bilateral.  Capillary refill time is immediate to all digits,  Normal temperature gradient.  Digital hair growth is present bilateral  NEUROLOGIC: sensation is normal to 5.07 monofilament at 5/5 sites bilateral.  Light touch is intact bilateral, Muscle strength normal.  MUSCULOSKELETAL: acceptable muscle strength, tone and stability bilateral.  Intrinsic muscluature intact bilateral.  Rectus appearance of foot and digits noted bilateral.   DERMATOLOGIC: skin color, texture, and turgor are within normal limits.  No preulcerative lesions or ulcers  are seen, no interdigital maceration noted.  No open lesions present.  No drainage noted.  Thick disfigured discolored ingrown toenails both feet.         Assessment & Plan:  Onychomycosis B/L   Debridement and grinding of nails both feet.  Gardiner Barefoot DPM

## 2016-05-01 ENCOUNTER — Telehealth: Payer: Self-pay | Admitting: Internal Medicine

## 2016-05-01 ENCOUNTER — Encounter: Payer: Self-pay | Admitting: Internal Medicine

## 2016-05-01 ENCOUNTER — Ambulatory Visit (INDEPENDENT_AMBULATORY_CARE_PROVIDER_SITE_OTHER)
Admission: RE | Admit: 2016-05-01 | Discharge: 2016-05-01 | Disposition: A | Discharge status: Home / Self Care | Payer: Medicare Other | Source: Ambulatory Visit | Attending: Internal Medicine | Admitting: Internal Medicine

## 2016-05-01 ENCOUNTER — Ambulatory Visit (INDEPENDENT_AMBULATORY_CARE_PROVIDER_SITE_OTHER): Payer: Medicare Other | Admitting: Internal Medicine

## 2016-05-01 VITALS — BP 146/70 | HR 59 | Ht 60.0 in | Wt 111.0 lb

## 2016-05-01 DIAGNOSIS — Z8701 Personal history of pneumonia (recurrent): Secondary | ICD-10-CM

## 2016-05-01 DIAGNOSIS — J449 Chronic obstructive pulmonary disease, unspecified: Secondary | ICD-10-CM

## 2016-05-01 DIAGNOSIS — K449 Diaphragmatic hernia without obstruction or gangrene: Secondary | ICD-10-CM | POA: Diagnosis not present

## 2016-05-01 NOTE — Telephone Encounter (Signed)
Called and spoke to pt. Informed her of the results per MR. Pt verbalized understanding and denied any further questions or concerns at this time.  

## 2016-05-01 NOTE — Telephone Encounter (Signed)
Let [name] know that cxr does not  Show any active pneumonia  Dg Chest 2 View  Result Date: 05/01/2016 CLINICAL DATA:  COPD, hiatal hernia. EXAM: CHEST  2 VIEW COMPARISON:  11/30/2015 FINDINGS: There is hyperinflation of the lungs compatible with COPD. Prior aortic valve repair. Prominent pulmonary artery again noted which may reflect pulmonary arterial hypertension. Calcifications within the aortic arch and descending thoracic aorta. Scarring in the lung bases. No effusions. No acute infiltrates. No acute bony abnormality. IMPRESSION: COPD/chronic changes, stable. No active disease. Aortic atherosclerosis. Electronically Signed   By: Rolm Baptise M.D.   On: 05/01/2016 10:20     Dr. Brand Males, M.D., F.C.C.P Pulmonary and Critical Care Medicine Staff Physician [place] [facility] Pager: [phone], If no answer or between  15:00h - 7:00h: call [phone]  05/01/2016 3:29 PM

## 2016-05-01 NOTE — Patient Instructions (Addendum)
COPD GOLD II  - Stable disease - Continue Flovent 2 puffs 2 times daily - Albuterol as needed - Flu shot in the fall - Recommend Pneumovax - We'll consider you for a [employer] sponsored research trial  Hiatal hernia - Talk to primary care physician Harlan Stains and see if she can give an alternative to Fosamax which can make acid reflux worse  History of bacterial pneumonia March 2017 -Do chest x-ray 2 view to ensure clearance -Consider Pneumovax vaccine today or at follow-up  Follow-up - 3 months or sooner if needed

## 2016-05-01 NOTE — Progress Notes (Signed)
Subjective:     Patient ID: [name], female   DOB: 1931-10-03, 82 y.o.   MRN: [id number]  HPI    OV 05/03/2015  COPD 2 with setting of moderate hiatal hernia    This is a routine follow-up. She is doing well. Most recently her inhaler got switched to [facility] because of cough. The powdered form of prior inhaler was removed by Dr. Melvyn Strong. After this she is doing well. Cough is improved. COPD stable. She tried to get into pulmonary rehabilitation but deferred due to cost of $80 per session. There are no new issues. COPD CAT score 14 and improved as below compared to aporil 2016.   OV 11/01/2015  Chief Complaint  Patient presents with  . Follow-up    [name], [name], then was changed back to flovent is tolerating this well. Pt denies significant cough. Pt denies CP/tightness.      Follow-up Gold stage II COPD [May 2016 DLCO 6.1/34%, postbronchodilator FEV1 1.2 L/81% without broncho-dilator change] in the setting of moderate hiatal hernia. Alpha 1-MM 2013 Clear CT chest May 2016. [name] Before that it was [facility]. Her primary care physician Desiree S, MD switched her r to Flovent which she is taking 2 puff 2 times daily and she is feeling well. No interim COPD exacerbations or ear admissions. No new medical problems. She is feeling well. She is quite functional. In the past she has been intolerant to Spiriva powdered inhaler but she is willing to try the respimat     OV 05/01/2016   Desiree Schwalbe, MD   Chief Complaint  Patient presents with  . Follow-up    Pt states her SOB is at baseline. Pt states she has been needing to clear her throat more than normal d/t increase in mucus. Pt denies CP/tightness and f/c/Strong.    FU Gold stage 2  copd" with hiatal hernia and irritable larynx made worse bu powerederd MDI  Last seen August 2016. In the interim March 2017 she had admission for pneumonia in the hospital. I review the chart and confirmed the same. Follow-up chest x-ray showed partial improvement but she'Strong not had a follow-up chest x-ray since then. [name] Currently doing well. Irritable larynx is at baseline and is mild. She is wondering about the role of hiatal hernia in her irritable larynx. Med review shows she is on Fosamax. She'Strong not fully up-to-date with her vaccines. She is in need of Pneumovax but she declined it today.  . Immunization History  Administered Date(Strong) Administered  . Influenza Split 07/19/2014  . Influenza,inj,Quad PF,36+ Mos 06/19/2015  . Pneumococcal Conjugate-13 09/18/2013  . Zoster 08/18/2014      CAT COPD Symptom & Quality of Life Score ([place]) 0 is no burden. 5 is highest burden 01/01/2015  05/03/2015   Never Cough -> Cough all the time 2 1  No phlegm in chest -> Chest is full of phlegm 3 1  No chest tightness -> Chest feels very tight 4 1  No dyspnea for 1 flight stairs/hill -> Very dyspneic for 1 flight of stairs 5 4  No limitations for ADL at home -> Very limited with ADL at home 3 2  Confident leaving home -> Not at all confident leaving home 3 2  Sleep soundly -> Do not sleep soundly because of lung condition 2 2  Lots of Energy -> No energy at all 2 1  TOTAL Score (max 40)  24 14       [name]; Aortic stenosis; Arthritis; Asthma; Depression; Diabetes mellitus; [name]; GERD (gastroesophageal reflux disease); Glaucoma; Hyperlipidemia; Hypertension; [name]; Murmur; and Nephrolithiasis.   reports that she quit smoking about 43 years ago. Her smoking use  included Cigarettes. She has a 30.00 pack-year smoking history. She has never used smokeless tobacco.  Past Surgical History:  Procedure Laterality Date  . ABDOMINAL HYSTERECTOMY    . ANKLE SURGERY  1998   d/t fx.  Left ankle  . ANTERIOR AND POSTERIOR REPAIR  06/21/2011   Procedure: ANTERIOR (CYSTOCELE) AND POSTERIOR REPAIR (RECTOCELE);  Surgeon: [name];  Location: [facility];  Service: Gynecology;  Laterality: N/A;  . AORTIC VALVE REPLACEMENT N/A 07/17/2013   Procedure: AORTIC VALVE REPLACEMENT (AVR);  Surgeon: Desiree Poot, MD;  Location: [place];  Service: Open Heart Surgery;  Laterality: N/A;  . CARDIAC CATHETERIZATION    . CARDIAC VALVE REPLACEMENT    . CORONARY ANGIOPLASTY    . CORONARY ARTERY BYPASS GRAFT N/A 07/17/2013   Procedure: CORONARY ARTERY BYPASS GRAFT, TIMES ONE, ON PUMP, USING RIGHT INTERNAL MAMMARY ARTERY.;  Surgeon: Desiree Poot, MD;  Location: [place];  Service: Open Heart Surgery;  Laterality: N/A;  . FACIAL COSMETIC SURGERY     face lift  . INTRAOPERATIVE TRANSESOPHAGEAL ECHOCARDIOGRAM N/A 07/17/2013   Procedure: INTRAOPERATIVE TRANSESOPHAGEAL ECHOCARDIOGRAM;  Surgeon: Desiree Poot, MD;  Location: [place];  Service: Open Heart Surgery;  Laterality: N/A;  . LAPAROSCOPIC ASSISTED VAGINAL HYSTERECTOMY  06/21/2011   Procedure: LAPAROSCOPIC ASSISTED VAGINAL HYSTERECTOMY;  Surgeon: [name];  Location: [facility];  Service: Gynecology;  Laterality: N/A;  . LEFT AND RIGHT HEART CATHETERIZATION WITH CORONARY ANGIOGRAM N/A 06/27/2013   Procedure: LEFT AND RIGHT HEART CATHETERIZATION WITH CORONARY ANGIOGRAM;  Surgeon: Desiree Dial, MD;  Location: [facility] C[facility];  Service: Cardiovascular;  Laterality: N/A;  . NOSE SURGERY    . SALPINGOOPHORECTOMY  06/21/2011   Procedure: SALPINGO OOPHERECTOMY;  Surgeon: [name];  Location: [place] [facility];  Service: Gynecology;  Laterality: Bilateral;  . VAGINAL PROLAPSE REPAIR  06/21/2011   Procedure: SACROPEXY;  Surgeon: [name];   Location: [place] [facility];  Service: Gynecology;  Laterality: N/A;  sacrospinous ligament suspension    Allergies  Allergen Reactions  . Amaryl [Glimepiride]     Side effects  . Avelox [Moxifloxacin Hcl In Nacl]     nausea  . Ciprofloxacin     nausea  . Lipitor [Atorvastatin]     myalgias  . Lisinopril     cough  . Prevacid [Lansoprazole]     diarrhea  . Spiriva Handihaler [Tiotropium Bromide Monohydrate]     Increased cough  . Symbicort [Budesonide-Formoterol Fumarate]     Increased cough    Immunization History  Administered Date(Strong) Administered  . Influenza Split 07/19/2014  . Influenza,inj,Quad PF,36+ Mos 06/19/2015  . Pneumococcal Conjugate-13 09/18/2013  . Zoster 08/18/2014    Family History  Problem Relation Age of Onset  . Emphysema Father   . Colon cancer Neg Hx   . Esophageal cancer Neg  Hx   . Rectal cancer Neg Hx   . Stomach cancer Neg Hx      Current Outpatient Prescriptions:  .  acetaminophen (TYLENOL) 325 MG tablet, Take 650 mg by mouth every 6 (six) hours as needed for moderate pain or headache. , Disp: , Rfl:  .  alendronate (FOSAMAX) 70 MG tablet, Take 70 mg by mouth once a week. Take with a full glass of water on an empty stomach., Disp: , Rfl:  .  amLODipine (NORVASC) 10 MG tablet, Take 1 tablet (10 mg total) by mouth daily., Disp: 90 tablet, Rfl: 3 .  aspirin 81 MG tablet, Take 81 mg by mouth daily., Disp: , Rfl:  .  cholecalciferol (VITAMIN D) 1000 UNITS tablet, Take 1,000 Units by mouth daily., Disp: , Rfl:  .  famotidine (PEPCID) 20 MG tablet, Take 1 tablet (20 mg total) by mouth daily., Disp: 30 tablet, Rfl: 0 .  fluticasone (FLOVENT HFA) 220 MCG/ACT inhaler, Inhale 2 puffs into the lungs 2 (two) times daily., Disp: , Rfl:  .  furosemide (LASIX) 20 MG tablet, Take 1 tablet (20 mg total) by mouth every other day., Disp: 90 tablet, Rfl: 3 .  guaiFENesin (MUCINEX) 600 MG 12 hr tablet, Take 600 mg by mouth 2 (two) times daily as needed., Disp: , Rfl:  .   irbesartan (AVAPRO) 150 MG tablet, Take 1 tablet (150 mg total) by mouth daily., Disp: 90 tablet, Rfl: 3 .  Magnesium 250 MG TABS, Take 250 mg by mouth daily. , Disp: , Rfl:  .  Multiple Vitamins-Minerals (OCUVITE-LUTEIN PO), Take 1 tablet by mouth every morning. , Disp: , Rfl:  .  pravastatin (PRAVACHOL) 20 MG tablet, Take 20 mg by mouth daily., Disp: , Rfl:  .  PROAIR HFA 108 (90 BASE) MCG/ACT inhaler, Inhale 2 puffs into the lungs 4 (four) times daily as needed for wheezing or shortness of breath. , Disp: , Rfl:  .  sertraline (ZOLOFT) 50 MG tablet, Take 1 tablet (50 mg total) by mouth every morning., Disp: 30 tablet, Rfl: 1 .  albuterol (PROVENTIL) (2.5 MG/3ML) 0.083% nebulizer solution, Take 3 mLs (2.5 mg total) by nebulization every 6 (six) hours as needed for wheezing. (Patient not taking: Reported on 05/01/2016), Disp: 75 mL, Rfl: 2   Review of Systems     Objective:   Physical Exam  Constitutional: She is oriented to person, place, and time. She appears well-developed and well-nourished. No distress.  HENT:  Head: Normocephalic and atraumatic.  Right Ear: External ear normal.  Left Ear: External ear normal.  Mouth/Throat: Oropharynx is clear and moist. No oropharyngeal exudate.  Eyes: Conjunctivae and EOM are normal. Pupils are equal, round, and reactive to light. Right eye exhibits no discharge. Left eye exhibits no discharge. No scleral icterus.  Neck: Normal range of motion. Neck supple. No JVD present. No tracheal deviation present. No thyromegaly present.  Cardiovascular: Normal rate, regular rhythm, normal heart sounds and intact distal pulses.  Exam reveals no gallop and no friction rub.   No murmur heard. Pulmonary/Chest: Effort normal and breath sounds normal. No respiratory distress. She has no wheezes. She has no rales. She exhibits no tenderness.  Abdominal: Soft. Bowel sounds are normal. She exhibits no distension and no mass. There is no tenderness. There is no rebound  and no guarding.  Musculoskeletal: Normal range of motion. She exhibits no edema or tenderness.  Lymphadenopathy:    She has no cervical adenopathy.  Neurological: She is alert and  oriented to person, place, and time. She has normal reflexes. No cranial nerve deficit. She exhibits normal muscle tone. Coordination normal.  Skin: Skin is warm and dry. No rash noted. She is not diaphoretic. No erythema. No pallor.  Psychiatric: She has a normal mood and affect. Her behavior is normal. Judgment and thought content normal.  Vitals reviewed.  Vitals:   05/01/16 0909  BP: (!) 146/70  Pulse: (!) 59  SpO2: 90%  Weight: 111 lb (50.3 kg)  Height: 5' (1.524 m)       Assessment:       ICD-9-CM ICD-10-CM   1. COPD GOLD II  496 J44.9 DG Chest 2 View  2. Hiatal hernia 553.3 K44.9 DG Chest 2 View  3. History of bacterial pneumonia V12.61 Z87.01 DG Chest 2 View       Plan:     COPD GOLD II  - Stable disease - Continue Flovent 2 puffs 2 times daily - Albuterol as needed - Flu shot in the fall - Recommend Pneumovax - We'll consider you for a [employer] sponsored research trial  Hiatal hernia - Talk to primary care physician Harlan Stains and see if she can give an alternative to Fosamax which can make acid reflux worse  History of bacterial pneumonia March 2017 -Do chest x-ray 2 view to ensure clearance -Consider Pneumovax vaccine today or at follow-up  Follow-up - 3 months or sooner if needed   Dr. Brand Males, M.D., [facility].C.P Pulmonary and Critical Care Medicine Staff Physician [place] [facility] Pager: [phone], If no answer or between  15:00h - 7:00h: call [phone]  05/01/2016 9:52 AM

## 2016-05-26 ENCOUNTER — Encounter: Payer: Self-pay | Admitting: Podiatry

## 2016-05-26 ENCOUNTER — Ambulatory Visit (INDEPENDENT_AMBULATORY_CARE_PROVIDER_SITE_OTHER): Payer: Medicare Other | Admitting: Podiatry

## 2016-05-26 DIAGNOSIS — M79676 Pain in unspecified toe(s): Secondary | ICD-10-CM | POA: Diagnosis not present

## 2016-05-26 DIAGNOSIS — B351 Tinea unguium: Secondary | ICD-10-CM

## 2016-05-26 DIAGNOSIS — M79609 Pain in unspecified limb: Principal | ICD-10-CM

## 2016-05-26 NOTE — Progress Notes (Signed)
   Subjective:    Patient ID: [name], female    DOB: [birth year], 82 y.o.   MRN: [id number]  HPI this patient presents to the office with chief complaint of painful toenails on both feet. He says her nails have grown thick and long, She says her nails appear to be ingrown on the second and third toes of the left foot. She denies any drainage from the sites. She presents the office for evaluation and treatment of her nails   Review of Systems  All other systems reviewed and are negative.      Objective:   Physical Exam GENERAL APPEARANCE: Alert, conversant. Appropriately groomed. No acute distress.  VASCULAR: Pedal pulses palpable at  [facility] and PT bilateral.  Capillary refill time is immediate to all digits,  Normal temperature gradient.    NEUROLOGIC: sensation is normal to 5.07 monofilament at 5/5 sites bilateral.  Light touch is intact bilateral, Muscle strength normal.  MUSCULOSKELETAL: acceptable muscle strength, tone and stability bilateral.  Intrinsic muscluature intact bilateral.  Rectus appearance of foot and digits noted bilateral.   DERMATOLOGIC: skin color, texture, and turgor are within normal limits.  No preulcerative lesions or ulcers  are seen, no interdigital maceration noted.  No open lesions present.  No drainage noted.  Thick disfigured discolored ingrown toenails both feet.         Assessment & Plan:  Onychomycosis B/L   Debridement and grinding of nails both feet.  Gardiner Barefoot DPM

## 2016-08-07 ENCOUNTER — Ambulatory Visit (INDEPENDENT_AMBULATORY_CARE_PROVIDER_SITE_OTHER): Payer: Medicare Other | Admitting: Internal Medicine

## 2016-08-07 ENCOUNTER — Encounter: Payer: Self-pay | Admitting: Internal Medicine

## 2016-08-07 VITALS — BP 140/62 | HR 66 | Ht 60.0 in | Wt 110.0 lb

## 2016-08-07 DIAGNOSIS — J449 Chronic obstructive pulmonary disease, unspecified: Secondary | ICD-10-CM

## 2016-08-07 DIAGNOSIS — K449 Diaphragmatic hernia without obstruction or gangrene: Secondary | ICD-10-CM | POA: Diagnosis not present

## 2016-08-07 DIAGNOSIS — Z8701 Personal history of pneumonia (recurrent): Secondary | ICD-10-CM

## 2016-08-07 NOTE — Progress Notes (Signed)
Subjective:     Patient ID: [name], female   DOB: 07/09/1932, 82 y.o.   MRN: [id number]  HPI      OV 05/03/2015  COPD 2 with setting of moderate hiatal hernia    This is a routine follow-up. She is doing well. Most recently her inhaler got switched to [facility] because of cough. The powdered form of prior inhaler was removed by Dr. Melvyn Strong. After this she is doing well. Cough is improved. COPD stable. She tried to get into pulmonary rehabilitation but deferred due to cost of $80 per session. There are no new issues. COPD CAT score 14 and improved as below compared to aporil 2016.   OV 11/01/2015  Chief Complaint  Patient presents with  . Follow-up    [name], was then changed to [facility] and could not tolerate the powder, then was changed back to flovent is tolerating this well. Pt denies significant cough. Pt denies CP/tightness.      Follow-up Gold stage II COPD [May 2016 DLCO 6.1/34%, postbronchodilator FEV1 1.2 L/81% without broncho-dilator change] in the setting of moderate hiatal hernia. Alpha 1-MM 2013 Clear CT chest May 2016. [name] Before that it was [facility]. Her primary care physician Desiree S, MD switched her r to Flovent which she is taking 2 puff 2 times daily and she is feeling well. No interim COPD exacerbations or ear admissions. No new medical problems. She is feeling well. She is quite functional. In the past she has been intolerant to Spiriva powdered inhaler but she is willing to try the respimat     OV 05/01/2016   Desiree Schwalbe, MD   Chief Complaint  Patient presents with  . Follow-up    Pt states her SOB is at baseline. Pt states she has been needing to clear her throat more than normal d/t increase in mucus. Pt denies CP/tightness and f/c/Strong.    FU Gold stage 2  copd" with hiatal hernia and irritable larynx made worse bu powerederd MDI  Last seen August 2016. In the interim March 2017 she had admission for pneumonia in the hospital. I review the chart and confirmed the same. Follow-up chest x-ray showed partial improvement but she'Strong not had a follow-up chest x-ray since then. [name] Currently doing well. Irritable larynx is at baseline and is mild. She is wondering about the role of hiatal hernia in her irritable larynx. Med review shows she is on Fosamax. She'Strong not fully up-to-date with her vaccines. She is in need of Pneumovax but she declined it today.  . Immunization History  Administered Date(Strong) Administered  . Influenza Split 07/19/2014  . Influenza,inj,Quad PF,36+ Mos 06/19/2015  . Pneumococcal Conjugate-13 09/18/2013  . Zoster 08/18/2014   OV 08/07/2016  Chief Complaint  Patient presents with  . Follow-up    Pt states her SOB is unchanged - at baseline. Pt denies cough, CP/tightness, and f/c/Strong.      Follow-up Gold stage II COPD  Visit 3 month follow-up. Overall she'Strong stable. Only complaint is chronic stable exertional dyspnea for a flight of stairs which is relieved by albuterol. She does not take albuterol before such exertion. She does  not like dry powdered inhalers although she is tolerating Flovent. She is agreeable to having another sample inhaler that might work better for her. She is up-to-date with her flu shot and pneumonia vaccine. Her blood pressure medications being changed recently and she'Strong having some side effects related to that. She will talk to primary care physician about it     CAT COPD Symptom & Quality of Life Score ([employer] trademark) 0 is no burden. 5 is highest burden 01/01/2015  05/03/2015   Never Cough -> Cough all the time 2 1  No phlegm in chest -> Chest  is full of phlegm 3 1  No chest tightness -> Chest feels very tight 4 1  No dyspnea for 1 flight stairs/hill -> Very dyspneic for 1 flight of stairs 5 4  No limitations for ADL at home -> Very limited with ADL at home 3 2  Confident leaving home -> Not at all confident leaving home 3 2  Sleep soundly -> Do not sleep soundly because of lung condition 2 2  Lots of Energy -> No energy at all 2 1  TOTAL Score (max 40)  24 14      [name]; Aortic stenosis; Arthritis; Asthma; Depression; Diabetes mellitus; Emphysema of lung ([place]); GERD (gastroesophageal reflux disease); Glaucoma; Hyperlipidemia; Hypertension; [name]; Murmur; and Nephrolithiasis.   reports that she quit smoking about 43 years ago. Her smoking use included Cigarettes. She has a 30.00 pack-year smoking history. She has never used smokeless tobacco.  Past Surgical History:  Procedure Laterality Date  . ABDOMINAL HYSTERECTOMY    . ANKLE SURGERY  1998   d/t fx.  Left ankle  . ANTERIOR AND POSTERIOR REPAIR  06/21/2011   Procedure: ANTERIOR (CYSTOCELE) AND POSTERIOR REPAIR (RECTOCELE);  Surgeon: Maisie Fus;  Location: [place] [facility];  Service: Gynecology;  Laterality: N/A;  . AORTIC VALVE REPLACEMENT N/A 07/17/2013   Procedure: AORTIC VALVE REPLACEMENT (AVR);  Surgeon: Ivin Poot, MD;  Location: [place];  Service: Open Heart Surgery;  Laterality: N/A;  . CARDIAC CATHETERIZATION    . CARDIAC VALVE REPLACEMENT    . CORONARY ANGIOPLASTY    . CORONARY ARTERY BYPASS GRAFT N/A 07/17/2013   Procedure: CORONARY ARTERY BYPASS GRAFT, TIMES ONE, ON PUMP, USING RIGHT INTERNAL MAMMARY ARTERY.;  Surgeon: Ivin Poot, MD;  Location: [place];  Service: Open Heart Surgery;  Laterality: N/A;  . FACIAL COSMETIC SURGERY     face lift  . INTRAOPERATIVE TRANSESOPHAGEAL ECHOCARDIOGRAM N/A 07/17/2013   Procedure: INTRAOPERATIVE TRANSESOPHAGEAL ECHOCARDIOGRAM;  Surgeon: Ivin Poot, MD;  Location: [place];  Service: Open Heart Surgery;  Laterality: N/A;  . LAPAROSCOPIC ASSISTED VAGINAL HYSTERECTOMY  06/21/2011   Procedure: LAPAROSCOPIC ASSISTED VAGINAL HYSTERECTOMY;  Surgeon: Maisie Fus;  Location: [place] [facility];  Service: Gynecology;  Laterality: N/A;  . LEFT AND RIGHT HEART CATHETERIZATION WITH CORONARY ANGIOGRAM N/A 06/27/2013   Procedure: LEFT AND RIGHT HEART CATHETERIZATION WITH CORONARY ANGIOGRAM;  Surgeon: Ramond Dial, MD;  Location[facility];  Service: Cardiovascular;  Laterality: N/A;  . NOSE SURGERY    . SALPINGOOPHORECTOMY  06/21/2011   Procedure: SALPINGO OOPHERECTOMY;  Surgeon: Maisie Fus;  Location: [place] [facility];  Service: Gynecology;  Laterality: Bilateral;  . VAGINAL PROLAPSE REPAIR  06/21/2011   Procedure: SACROPEXY;  Surgeon: Maisie Fus;  Location: [place] [facility];  Service: Gynecology;  Laterality: N/A;  sacrospinous ligament suspension    Allergies  Allergen Reactions  .  Amaryl [Glimepiride]     Side effects  . Avelox [Moxifloxacin Hcl In Nacl]     nausea  . Ciprofloxacin     nausea  . Lipitor [Atorvastatin]     myalgias  . Lisinopril     cough  . Prevacid [Lansoprazole]     diarrhea  . Spiriva Handihaler [Tiotropium Bromide Monohydrate]     Increased cough  . Symbicort [Budesonide-Formoterol Fumarate]     Increased cough    Immunization History  Administered Date(Strong) Administered  . Influenza Split 07/19/2014  . Influenza, High Dose Seasonal PF 06/16/2016  . Influenza,inj,Quad PF,36+ Mos 06/19/2015  . Pneumococcal Conjugate-13 09/18/2013  . Pneumococcal Polysaccharide-23 05/14/2006  . Tdap 03/04/2013  . Zoster 08/20/2008, 08/18/2014    Family History  Problem Relation Age of Onset  . Emphysema Father   . Colon cancer Neg Hx   . Esophageal cancer Neg Hx   . Rectal cancer Neg Hx   . Stomach cancer Neg Hx      Current Outpatient Prescriptions:  .  alendronate (FOSAMAX) 70 MG tablet, Take 70 mg by mouth once a week. Take with a full glass of water on  an empty stomach., Disp: , Rfl:  .  amLODipine (NORVASC) 10 MG tablet, Take 1 tablet (10 mg total) by mouth daily., Disp: 90 tablet, Rfl: 3 .  aspirin 81 MG tablet, Take 81 mg by mouth daily., Disp: , Rfl:  .  chlorthalidone (HYGROTON) 25 MG tablet, Take 25 mg by mouth daily., Disp: , Rfl:  .  cholecalciferol (VITAMIN D) 1000 UNITS tablet, Take 1,000 Units by mouth daily., Disp: , Rfl:  .  famotidine (PEPCID) 20 MG tablet, Take 1 tablet (20 mg total) by mouth daily., Disp: 30 tablet, Rfl: 0 .  fluticasone (FLOVENT HFA) 220 MCG/ACT inhaler, Inhale 2 puffs into the lungs 2 (two) times daily., Disp: , Rfl:  .  irbesartan (AVAPRO) 150 MG tablet, Take 1 tablet (150 mg total) by mouth daily., Disp: 90 tablet, Rfl: 3 .  Magnesium 250 MG TABS, Take 250 mg by mouth daily. , Disp: , Rfl:  .  metFORMIN (GLUCOPHAGE) 500 MG tablet, Take 500 mg by mouth daily with breakfast., Disp: , Rfl:  .  Multiple Vitamins-Minerals (OCUVITE-LUTEIN PO), Take 1 tablet by mouth every morning. , Disp: , Rfl:  .  pravastatin (PRAVACHOL) 20 MG tablet, Take 20 mg by mouth daily., Disp: , Rfl:  .  PROAIR HFA 108 (90 BASE) MCG/ACT inhaler, Inhale 2 puffs into the lungs 4 (four) times daily as needed for wheezing or shortness of breath. , Disp: , Rfl:  .  sertraline (ZOLOFT) 50 MG tablet, Take 1 tablet (50 mg total) by mouth every morning., Disp: 30 tablet, Rfl: 1 .  albuterol (PROVENTIL) (2.5 MG/3ML) 0.083% nebulizer solution, Take 3 mLs (2.5 mg total) by nebulization every 6 (six) hours as needed for wheezing. (Patient not taking: Reported on 08/07/2016), Disp: 75 mL, Rfl: 2    Review of Systems     Objective:   Physical Exam  Constitutional: She is oriented to person, place, and time. She appears well-developed and well-nourished. No distress.  HENT:  Head: Normocephalic and atraumatic.  Right Ear: External ear normal.  Left Ear: External ear normal.  Mouth/Throat: Oropharynx is clear and moist. No oropharyngeal exudate.   Eyes: Conjunctivae and EOM are normal. Pupils are equal, round, and reactive to light. Right eye exhibits no discharge. Left eye exhibits no discharge. No scleral icterus.  Neck: Normal range of  motion. Neck supple. No JVD present. No tracheal deviation present. No thyromegaly present.  Cardiovascular: Normal rate, regular rhythm, normal heart sounds and intact distal pulses.  Exam reveals no gallop and no friction rub.   No murmur heard. Pulmonary/Chest: Effort normal and breath sounds normal. No respiratory distress. She has no wheezes. She has no rales. She exhibits no tenderness.  cabg scar  Abdominal: Soft. Bowel sounds are normal. She exhibits no distension and no mass. There is no tenderness. There is no rebound and no guarding.  Musculoskeletal: Normal range of motion. She exhibits no edema or tenderness.  Lymphadenopathy:    She has no cervical adenopathy.  Neurological: She is alert and oriented to person, place, and time. She has normal reflexes. No cranial nerve deficit. She exhibits normal muscle tone. Coordination normal.  Skin: Skin is warm and dry. No rash noted. She is not diaphoretic. No erythema. No pallor.  Psychiatric: She has a normal mood and affect. Her behavior is normal. Judgment and thought content normal.  Vitals reviewed.   Vitals:   08/07/16 0923  BP: 140/62  Pulse: 66  SpO2: 92%   Estimated body mass index is 21.68 kg/m as calculated from the following:   Height as of 05/01/16: 5' (1.524 m).   Weight as of 05/01/16: 111 lb (50.3 kg).      Assessment:       ICD-9-CM ICD-10-CM   1. COPD GOLD II  496 J44.9   2. Hiatal hernia 553.3 K44.9   3. History of bacterial pneumonia V12.61 Z87.01        Plan:     COPD GOLD II  - Stable disease - try 4 weeks of sample bevespi 2 puff two times daily  - during this time hold flovent  - call back in 2-3 weeks with response - if bevespi not working well for you go baack to Flovent 2 puffs 2 times daily -  Albuterol as needed - Flu shot glad you are uptdate   Hiatal hernia - Talk to primary care physician Desiree Schwalbe, MD  and see if she can give an alternative to Fosamax which can make acid reflux worse - I See that you are still fosfamax  History of bacterial pneumonia March 2017 -Do chest x-ray 2 view at august 2017 showed clearance - no further cxr  Follow-up - 6 months or sooner if needed  Dr. Brand Males, M.D., [facility].C.P Pulmonary and Critical Care Medicine Staff Physician [place] [facility] Pager: [phone], If no answer or between  15:00h - 7:00h: call [phone]  08/07/2016 9:37 AM

## 2016-08-07 NOTE — Patient Instructions (Addendum)
COPD GOLD II  - Stable disease - try 4 weeks of sample bevespi 2 puff two times daily  - during this time hold flovent  - call back in 2-3 weeks with response - if bevespi not working well for you go baack to Flovent 2 puffs 2 times daily - Albuterol as needed - Flu shot glad you are uptdate   Hiatal hernia - Talk to primary care physician [name], MD  and see if she can give an alternative to Fosamax which can make acid reflux worse - I See that you are still fosfamax  History of bacterial pneumonia March 2017 -Do chest x-ray 2 view at august 2017 showed clearance - no further cxr  Follow-up - 6 months or sooner if needed

## 2016-08-25 ENCOUNTER — Ambulatory Visit (INDEPENDENT_AMBULATORY_CARE_PROVIDER_SITE_OTHER): Payer: Medicare Other | Admitting: Podiatry

## 2016-08-25 ENCOUNTER — Encounter: Payer: Self-pay | Admitting: Podiatry

## 2016-08-25 VITALS — Ht 60.0 in | Wt 110.0 lb

## 2016-08-25 DIAGNOSIS — B351 Tinea unguium: Secondary | ICD-10-CM | POA: Diagnosis not present

## 2016-08-25 DIAGNOSIS — M79609 Pain in unspecified limb: Secondary | ICD-10-CM | POA: Diagnosis not present

## 2016-08-25 NOTE — Progress Notes (Signed)
   Subjective:    Patient ID: [name], female    DOB: 1932-05-30, 82 y.o.   MRN: [id number]  HPI this patient presents to the office with chief complaint of painful toenails on both feet. He says her nails have grown thick and long, She says her nails appear to be ingrown on the second and third toes of the left foot. She denies any drainage from the sites. She presents the office for evaluation and treatment of her nails   Review of Systems  All other systems reviewed and are negative.      Objective:   Physical Exam GENERAL APPEARANCE: Alert, conversant. Appropriately groomed. No acute distress.  VASCULAR: Pedal pulses palpable at  [facility] and PT bilateral.  Capillary refill time is immediate to all digits,  Normal temperature gradient.    NEUROLOGIC: sensation is normal to 5.07 monofilament at 5/5 sites bilateral.  Light touch is intact bilateral, Muscle strength normal.  MUSCULOSKELETAL: acceptable muscle strength, tone and stability bilateral.  Intrinsic muscluature intact bilateral.  Rectus appearance of foot and digits noted bilateral.   DERMATOLOGIC: skin color, texture, and turgor are within normal limits.  No preulcerative lesions or ulcers  are seen, no interdigital maceration noted.  No open lesions present.  No drainage noted.  Thick disfigured discolored ingrown toenails both feet.         Assessment & Plan:  Onychomycosis B/L   Debridement and grinding of nails both feet.  Gardiner Barefoot DPM

## 2016-10-25 ENCOUNTER — Other Ambulatory Visit: Payer: Self-pay | Admitting: Cardiology

## 2016-10-25 DIAGNOSIS — I6523 Occlusion and stenosis of bilateral carotid arteries: Secondary | ICD-10-CM

## 2016-10-27 ENCOUNTER — Emergency Department (HOSPITAL_COMMUNITY)
Admission: EM | Admit: 2016-10-27 | Discharge: 2016-10-27 | Disposition: A | Discharge status: Home / Self Care | Payer: Medicare Other | Attending: Emergency Medicine | Admitting: Emergency Medicine

## 2016-10-27 ENCOUNTER — Emergency Department (HOSPITAL_COMMUNITY): Payer: Medicare Other

## 2016-10-27 ENCOUNTER — Encounter (HOSPITAL_COMMUNITY): Payer: Self-pay | Admitting: *Deleted

## 2016-10-27 DIAGNOSIS — R338 Other retention of urine: Secondary | ICD-10-CM

## 2016-10-27 DIAGNOSIS — T450X5A Adverse effect of antiallergic and antiemetic drugs, initial encounter: Secondary | ICD-10-CM | POA: Diagnosis not present

## 2016-10-27 DIAGNOSIS — Z7984 Long term (current) use of oral hypoglycemic drugs: Secondary | ICD-10-CM | POA: Diagnosis not present

## 2016-10-27 DIAGNOSIS — J449 Chronic obstructive pulmonary disease, unspecified: Secondary | ICD-10-CM | POA: Diagnosis not present

## 2016-10-27 DIAGNOSIS — N289 Disorder of kidney and ureter, unspecified: Secondary | ICD-10-CM | POA: Diagnosis not present

## 2016-10-27 DIAGNOSIS — R059 Cough, unspecified: Secondary | ICD-10-CM

## 2016-10-27 DIAGNOSIS — Y638 Failure in dosage during other surgical and medical care: Secondary | ICD-10-CM | POA: Insufficient documentation

## 2016-10-27 DIAGNOSIS — R339 Retention of urine, unspecified: Secondary | ICD-10-CM | POA: Diagnosis present

## 2016-10-27 DIAGNOSIS — T50905A Adverse effect of unspecified drugs, medicaments and biological substances, initial encounter: Secondary | ICD-10-CM

## 2016-10-27 DIAGNOSIS — R05 Cough: Secondary | ICD-10-CM | POA: Diagnosis not present

## 2016-10-27 DIAGNOSIS — Z87891 Personal history of nicotine dependence: Secondary | ICD-10-CM | POA: Insufficient documentation

## 2016-10-27 DIAGNOSIS — I10 Essential (primary) hypertension: Secondary | ICD-10-CM | POA: Diagnosis not present

## 2016-10-27 DIAGNOSIS — Z951 Presence of aortocoronary bypass graft: Secondary | ICD-10-CM | POA: Insufficient documentation

## 2016-10-27 DIAGNOSIS — E119 Type 2 diabetes mellitus without complications: Secondary | ICD-10-CM | POA: Insufficient documentation

## 2016-10-27 LAB — CBC
HCT: 35.2 % — ABNORMAL LOW (ref 36.0–46.0)
HEMOGLOBIN: 10.7 g/dL — AB (ref 12.0–15.0)
MCH: 26.1 pg (ref 26.0–34.0)
MCHC: 30.4 g/dL (ref 30.0–36.0)
MCV: 85.9 fL (ref 78.0–100.0)
Platelets: 183 10*3/uL (ref 150–400)
RBC: 4.1 MIL/uL (ref 3.87–5.11)
RDW: 15 % (ref 11.5–15.5)
WBC: 6.3 10*3/uL (ref 4.0–10.5)

## 2016-10-27 LAB — HEPATIC FUNCTION PANEL
ALBUMIN: 3.8 g/dL (ref 3.5–5.0)
ALK PHOS: 53 U/L (ref 38–126)
ALT: 25 U/L (ref 14–54)
AST: 30 U/L (ref 15–41)
Bilirubin, Direct: <0.1 mg/dL — ABNORMAL LOW (ref 0.1–0.5)
TOTAL PROTEIN: 6.6 g/dL (ref 6.5–8.1)
Total Bilirubin: 0.6 mg/dL (ref 0.3–1.2)

## 2016-10-27 LAB — URINALYSIS, ROUTINE W REFLEX MICROSCOPIC
BILIRUBIN URINE: NEGATIVE
GLUCOSE, UA: NEGATIVE mg/dL
Hgb urine dipstick: NEGATIVE
KETONES UR: NEGATIVE mg/dL
Leukocytes, UA: NEGATIVE
Nitrite: NEGATIVE
PH: 5 (ref 5.0–8.0)
Protein, ur: NEGATIVE mg/dL
Specific Gravity, Urine: 1.012 (ref 1.005–1.030)

## 2016-10-27 LAB — BASIC METABOLIC PANEL
ANION GAP: 12 (ref 5–15)
BUN: 49 mg/dL — ABNORMAL HIGH (ref 6–20)
CALCIUM: 9.4 mg/dL (ref 8.9–10.3)
CHLORIDE: 107 mmol/L (ref 101–111)
CO2: 21 mmol/L — AB (ref 22–32)
Creatinine, Ser: 1.44 mg/dL — ABNORMAL HIGH (ref 0.44–1.00)
GFR calc Af Amer: 38 mL/min — ABNORMAL LOW (ref >60)
GFR calc non Af Amer: 33 mL/min — ABNORMAL LOW (ref >60)
GLUCOSE: 149 mg/dL — AB (ref 65–99)
Potassium: 4.3 mmol/L (ref 3.5–5.1)
Sodium: 140 mmol/L (ref 135–145)

## 2016-10-27 MED ORDER — PREDNISONE 10 MG PO TABS: ORAL_TABLET | ORAL | 0 refills | Status: DC

## 2016-10-27 MED ORDER — PREDNISONE 20 MG PO TABS
60.0000 mg | ORAL_TABLET | Freq: Once | ORAL | Status: AC
Start: 2016-10-27 — End: 2016-10-27
  Administered 2016-10-27: 60 mg via ORAL
  Filled 2016-10-27: qty 3

## 2016-10-27 MED ORDER — IPRATROPIUM-ALBUTEROL 0.5-2.5 (3) MG/3ML IN SOLN
3.0000 mL | Freq: Once | RESPIRATORY_TRACT | Status: AC
  Administered 2016-10-27: 3 mL via RESPIRATORY_TRACT
  Filled 2016-10-27: qty 3

## 2016-10-27 MED ORDER — PREDNISONE 20 MG PO TABS: 60.0000 mg | ORAL_TABLET | Freq: Every day | ORAL | Status: DC

## 2016-10-27 NOTE — ED Triage Notes (Signed)
Pt c/o scant urine onset post starting cough medicine started x 5 days ago, pt denies dysuria, pt denies hematuria, pt states, "I can make urine but it is not much and I have to push on my belly to get it to come out." pt reports hx of the same, pt sees nephrology, pt reports dx of mod kidney disease, denies fever & chills, A&O x4

## 2016-10-27 NOTE — ED Notes (Signed)
Unable to draw labs, x 2 attempts, phlebotomy to collect labs

## 2016-10-27 NOTE — ED Provider Notes (Signed)
[facility] DEPT Provider Note   CSN: [id number] Arrival date & time: 10/27/16  0753     History   Chief Complaint Chief Complaint  Patient presents with  . Urinary Retention    HPI [name] is a 83 y.o. female.  The history is provided by the patient. No language interpreter was used.  Cough  This is a new problem. Episode onset: 5 days. The problem occurs constantly. The problem has been gradually worsening. The cough is non-productive. There has been no fever. Pertinent negatives include no shortness of breath. She has tried cough syrup and an opioid for the symptoms. The treatment provided no relief. She is a smoker. Her past medical history does not include pneumonia.  Pt reports she was seen at [facility] walk in clinic 5 days ago.  Pt was given an rx for atrovent nasal spray and tussionex.  Pt complains of mouth being dry and difficulty urinating.  Pt reports she has to squeeze hard to have anything come out. Pt thinks it may be the medications  Past Medical History:  Diagnosis Date  . Anxiety   . Aortic stenosis   . Arthritis   . Asthma    uses inhaler as needed  . Depression   . Diabetes mellitus   . Emphysema of lung ([place])   . GERD (gastroesophageal reflux disease)   . Glaucoma   . Hyperlipidemia   . Hypertension   . [name]   . Murmur   . Nephrolithiasis     Patient Active Problem List   Diagnosis Date Noted  . Hiatal hernia 05/01/2016  . History of bacterial pneumonia 05/01/2016  . COPD exacerbation ([place]) 11/18/2015  . Sepsis ([place]) 11/18/2015  . Acute kidney injury ([place]) 11/18/2015  . Hyperglycemia 11/18/2015  . S/P AVR (aortic valve replacement) 07/23/2013  . S/P CABG x 1 07/23/2013  . Aortic stenosis 06/10/2013  . Essential hypertension 06/10/2013  . COPD GOLD II  10/26/2011    Past Surgical History:  Procedure Laterality Date  . ABDOMINAL HYSTERECTOMY    . ANKLE SURGERY  1998   d/t fx.  Left ankle  . ANTERIOR AND  POSTERIOR REPAIR  06/21/2011   Procedure: ANTERIOR (CYSTOCELE) AND POSTERIOR REPAIR (RECTOCELE);  Surgeon: Maisie Fus;  Location: [facility];  Service: Gynecology;  Laterality: N/A;  . AORTIC VALVE REPLACEMENT N/A 07/17/2013   Procedure: AORTIC VALVE REPLACEMENT (AVR);  Surgeon: Ivin Poot, MD;  Location: [facility];  Service: Open Heart Surgery;  Laterality: N/A;  . CARDIAC CATHETERIZATION    . CARDIAC VALVE REPLACEMENT    . CORONARY ANGIOPLASTY    . CORONARY ARTERY BYPASS GRAFT N/A 07/17/2013   Procedure: CORONARY ARTERY BYPASS GRAFT, TIMES ONE, ON PUMP, USING RIGHT INTERNAL MAMMARY ARTERY.;  Surgeon: Ivin Poot, MD;  Location: [facility];  Service: Open Heart Surgery;  Laterality: N/A;  . FACIAL COSMETIC SURGERY     face lift  . INTRAOPERATIVE TRANSESOPHAGEAL ECHOCARDIOGRAM N/A 07/17/2013   Procedure: INTRAOPERATIVE TRANSESOPHAGEAL ECHOCARDIOGRAM;  Surgeon: Ivin Poot, MD;  Location: [facility];  Service: Open Heart Surgery;  Laterality: N/A;  . LAPAROSCOPIC ASSISTED VAGINAL HYSTERECTOMY  06/21/2011   Procedure: LAPAROSCOPIC ASSISTED VAGINAL HYSTERECTOMY;  Surgeon: Maisie Fus;  Location: [facility];  Service: Gynecology;  Laterality: N/A;  . LEFT AND RIGHT HEART CATHETERIZATION WITH CORONARY ANGIOGRAM N/A 06/27/2013   Procedure: LEFT AND RIGHT HEART CATHETERIZATION WITH CORONARY ANGIOGRAM;  Surgeon: Ramond Dial, MD;  Location: [facility] CATH  LAB;  Service: Cardiovascular;  Laterality: N/A;  . NOSE SURGERY    . SALPINGOOPHORECTOMY  06/21/2011   Procedure: SALPINGO OOPHERECTOMY;  Surgeon: Maisie Fus;  Location: [facility];  Service: Gynecology;  Laterality: Bilateral;  . VAGINAL PROLAPSE REPAIR  06/21/2011   Procedure: SACROPEXY;  Surgeon: Maisie Fus;  Location: [facility];  Service: Gynecology;  Laterality: N/A;  sacrospinous ligament suspension    OB History    No data available       Home Medications    Prior to Admission medications   Medication Sig Start Date End Date Taking?  Authorizing Provider  albuterol (PROVENTIL) (2.5 MG/3ML) 0.083% nebulizer solution Take 3 mLs (2.5 mg total) by nebulization every 6 (six) hours as needed for wheezing. 11/23/15  Yes Silver Huguenin Elgergawy, MD  amLODipine (NORVASC) 10 MG tablet Take 1 tablet (10 mg total) by mouth daily. 10/19/15  Yes Lelon Perla, MD  chlorthalidone (HYGROTON) 25 MG tablet Take 25 mg by mouth daily.   Yes Historical Provider, MD  famotidine (PEPCID) 20 MG tablet Take 1 tablet (20 mg total) by mouth daily. 11/23/15  Yes Silver Huguenin Elgergawy, MD  fluticasone (FLOVENT HFA) 220 MCG/ACT inhaler Inhale 2 puffs into the lungs 2 (two) times daily.   Yes Historical Provider, MD  glimepiride (AMARYL) 1 MG tablet Take 1 mg by mouth daily with breakfast.   Yes Historical Provider, MD  ibandronate (BONIVA) 150 MG tablet Take 150 mg by mouth every 30 (thirty) days. Take in the morning with a full glass of water, on an empty stomach, and do not take anything else by mouth or lie down for the next 30 min.   Yes Historical Provider, MD  irbesartan (AVAPRO) 150 MG tablet Take 1 tablet (150 mg total) by mouth daily. 10/12/14  Yes Lelon Perla, MD  Magnesium 250 MG TABS Take 250 mg by mouth daily.    Yes Historical Provider, MD  Multiple Vitamins-Minerals (OCUVITE-LUTEIN PO) Take 1 tablet by mouth every morning.    Yes Historical Provider, MD  pravastatin (PRAVACHOL) 20 MG tablet Take 40 mg by mouth every evening.    Yes Historical Provider, MD  PROAIR HFA 108 (90 BASE) MCG/ACT inhaler Inhale 2 puffs into the lungs 4 (four) times daily as needed for wheezing or shortness of breath.  02/24/14  Yes Historical Provider, MD  sertraline (ZOLOFT) 50 MG tablet Take 1 tablet (50 mg total) by mouth every morning. Patient taking differently: Take 100 mg by mouth every morning.  08/05/13  Yes Lavon Paganini Angiulli, PA-C    Family History Family History  Problem Relation Age of Onset  . Emphysema Father   . Colon cancer Neg Hx   . Esophageal cancer  Neg Hx   . Rectal cancer Neg Hx   . Stomach cancer Neg Hx     Social History Social History  Substance Use Topics  . Smoking status: Former Smoker    Packs/day: 2.00    Years: 15.00    Types: Cigarettes    Quit date: 09/18/1972  . Smokeless tobacco: Never Used  . Alcohol use No     Allergies   Amaryl [glimepiride]; Avelox [moxifloxacin hcl in nacl]; Ciprofloxacin; Lipitor [atorvastatin]; Lisinopril; Other; Prevacid [lansoprazole]; Spiriva handihaler [tiotropium bromide monohydrate]; and Symbicort [budesonide-formoterol fumarate]   Review of Systems Review of Systems  Respiratory: Positive for cough. Negative for shortness of breath.   Genitourinary: Positive for difficulty urinating.  All other systems reviewed and are negative.  Physical Exam Updated Vital Signs BP 130/69   Pulse 67   Temp 100.2 F (37.9 C) (Oral)   Resp 18   Ht '4\' 11"'$  (1.499 m)   Wt 48.5 kg   SpO2 90%   BMI 21.61 kg/m   Physical Exam  Constitutional: She appears well-developed and well-nourished. No distress.  HENT:  Head: Normocephalic and atraumatic.  Right Ear: External ear normal.  Left Ear: External ear normal.  Nose: Nose normal.  Mouth/Throat: Oropharynx is clear and moist.  Eyes: Conjunctivae are normal.  Neck: Neck supple.  Cardiovascular: Normal rate and regular rhythm.   No murmur heard. Pulmonary/Chest: Effort normal and breath sounds normal. No respiratory distress.  Abdominal: Soft. There is no tenderness.  Musculoskeletal: She exhibits no edema.  Neurological: She is alert.  Skin: Skin is warm and dry.  Psychiatric: She has a normal mood and affect.  Nursing note and vitals reviewed.    ED Treatments / Results  Labs (all labs ordered are listed, but only abnormal results are displayed) Labs Reviewed  BASIC METABOLIC PANEL - Abnormal; Notable for the following:       Result Value   CO2 21 (*)    Glucose, Bld 149 (*)    BUN 49 (*)    Creatinine, Ser 1.44 (*)     GFR calc non Af Amer 33 (*)    GFR calc Af Amer 38 (*)    All other components within normal limits  CBC - Abnormal; Notable for the following:    Hemoglobin 10.7 (*)    HCT 35.2 (*)    All other components within normal limits  HEPATIC FUNCTION PANEL - Abnormal; Notable for the following:    Bilirubin, Direct <0.1 (*)    All other components within normal limits  URINALYSIS, ROUTINE W REFLEX MICROSCOPIC    EKG  EKG Interpretation None       Radiology Dg Chest 2 View  Result Date: 10/27/2016 CLINICAL DATA:  One week history of cough. EXAM: CHEST  2 VIEW COMPARISON:  Chest x-rays and chest CT 2017 FINDINGS: The heart is upper limits of normal in size. Stable tortuosity and calcification of the thoracic aorta. Surgical changes from aortic valve replacement surgery. Stable emphysematous changes and basilar pulmonary scarring. No definite acute superimposed pneumonia, pleural effusion or pulmonary edema. Stable moderate-sized hiatal hernia. The bony thorax is intact. Stable degenerative changes involving the spine. IMPRESSION: Chronic emphysematous changes and pulmonary scarring at the bases. No definite acute overlying pulmonary process. Electronically Signed   By: Marijo Sanes M.D.   On: 10/27/2016 10:12    Procedures Procedures (including critical care time)  Medications Ordered in ED Medications - No data to display   Initial Impression / Assessment and Plan / ED Course  I have reviewed the triage vital signs and the nursing notes.  Pertinent labs & imaging results that were available during my care of the patient were reviewed by me and considered in my medical decision making (see chart for details).     BUN and creatinine in pt's usual range.  Urine normal no infection.  Chest xray normal, no signs of fluid overload or pneumonia.  Medication side effects reviewed.  Both medications could cause dry mouth.  Pt advised lozenges,  Stop atrovent and tussionex.  Follow up with  your MD on Monday for recheck  Final Clinical Impressions(s) / ED Diagnoses   Final diagnoses:  Renal insufficiency  Medication adverse effect, initial encounter  Cough  New Prescriptions New Prescriptions   PREDNISONE (DELTASONE) 10 MG TABLET    5,4,3,2,1 taper    Pt given duoneb.  Improved 02 sats,  Pt has inhaler and nebulizer at home.  Pt given prednisone here. Rx for prednisone Fransico Meadow, PA-C 10/27/16 [place], PA-C 10/27/16 [place], MD 10/28/16 [phone]

## 2016-10-27 NOTE — Discharge Instructions (Signed)
Stop taking tussionex and atrovent both of these medications could cause you to feel dried out and have difficulty urinating.

## 2016-10-31 NOTE — Progress Notes (Signed)
HPI: FU aortic stenosis/s/p AVR and CAD. Patient underwent cardiac catheterization in October of 2014. She was found to have a 70% LAD and there was a 40-50% mid RCA. In October of 2014 the patient underwent aortic valve replacement with a pericardial valve and coronary artery bypassing graft with a LIMA to the LAD. Carotid dopplers 2/17 showed 40-59 right and 1-39 left stenosis; thyroid nodules noted and pt asked to fu with primary care. Echo 2/17 showed normal LV function, bioprosthetic aortic valve with trace AI. Since I last saw her, she does have dyspnea on exertion but no orthopnea, PND, pedal edema, chest pain, palpitations or syncope.  Current Outpatient Prescriptions  Medication Sig Dispense Refill  . albuterol (PROVENTIL) (2.5 MG/3ML) 0.083% nebulizer solution Take 3 mLs (2.5 mg total) by nebulization every 6 (six) hours as needed for wheezing. 75 mL 2  . amLODipine (NORVASC) 10 MG tablet Take 1 tablet (10 mg total) by mouth daily. 90 tablet 3  . chlorthalidone (HYGROTON) 25 MG tablet Take 25 mg by mouth daily.    . famotidine (PEPCID) 20 MG tablet Take 1 tablet (20 mg total) by mouth daily. 30 tablet 0  . fluticasone (FLOVENT HFA) 220 MCG/ACT inhaler Inhale 2 puffs into the lungs 2 (two) times daily.    [name] glimepiride (AMARYL) 1 MG tablet Take 1 mg by mouth daily with breakfast.    . ibandronate (BONIVA) 150 MG tablet Take 150 mg by mouth every 30 (thirty) days. Take in the morning with a full glass of water, on an empty stomach, and do not take anything else by mouth or lie down for the next 30 min.    . irbesartan (AVAPRO) 150 MG tablet Take 1 tablet (150 mg total) by mouth daily. 90 tablet 3  . Magnesium 250 MG TABS Take 250 mg by mouth daily.     . Multiple Vitamins-Minerals (OCUVITE-LUTEIN PO) Take 1 tablet by mouth every morning.     . pravastatin (PRAVACHOL) 20 MG tablet Take 40 mg by mouth every evening.     . predniSONE (DELTASONE) 10 MG tablet 5,4,3,2,1 taper 15 tablet 0    . PROAIR HFA 108 (90 BASE) MCG/ACT inhaler Inhale 2 puffs into the lungs 4 (four) times daily as needed for wheezing or shortness of breath.     . sertraline (ZOLOFT) 50 MG tablet Take 1 tablet (50 mg total) by mouth every morning. (Patient taking differently: Take 100 mg by mouth every morning. ) 30 tablet 1   No current facility-administered medications for this visit.      Past Medical History:  Diagnosis Date  . Anxiety   . Aortic stenosis   . Arthritis   . Asthma    uses inhaler as needed  . Depression   . Diabetes mellitus   . Emphysema of lung ([place])   . GERD (gastroesophageal reflux disease)   . Glaucoma   . Hyperlipidemia   . Hypertension   . [name]   . Murmur   . Nephrolithiasis     Past Surgical History:  Procedure Laterality Date  . ABDOMINAL HYSTERECTOMY    . ANKLE SURGERY  1998   d/t fx.  Left ankle  . ANTERIOR AND POSTERIOR REPAIR  06/21/2011   Procedure: ANTERIOR (CYSTOCELE) AND POSTERIOR REPAIR (RECTOCELE);  Surgeon: Maisie Fus;  Location: [facility];  Service: Gynecology;  Laterality: N/A;  . AORTIC VALVE REPLACEMENT N/A 07/17/2013   Procedure: AORTIC VALVE REPLACEMENT (AVR);  Surgeon: Ivin Poot, MD;  Location: [facility];  Service: Open Heart Surgery;  Laterality: N/A;  . CARDIAC CATHETERIZATION    . CARDIAC VALVE REPLACEMENT    . CORONARY ANGIOPLASTY    . CORONARY ARTERY BYPASS GRAFT N/A 07/17/2013   Procedure: CORONARY ARTERY BYPASS GRAFT, TIMES ONE, ON PUMP, USING RIGHT INTERNAL MAMMARY ARTERY.;  Surgeon: Ivin Poot, MD;  Location: [place];  Service: Open Heart Surgery;  Laterality: N/A;  . FACIAL COSMETIC SURGERY     face lift  . INTRAOPERATIVE TRANSESOPHAGEAL ECHOCARDIOGRAM N/A 07/17/2013   Procedure: INTRAOPERATIVE TRANSESOPHAGEAL ECHOCARDIOGRAM;  Surgeon: Ivin Poot, MD;  Location: [place] [place];  Service: Open Heart Surgery;  Laterality: N/A;  . LAPAROSCOPIC ASSISTED VAGINAL HYSTERECTOMY  06/21/2011   Procedure:  LAPAROSCOPIC ASSISTED VAGINAL HYSTERECTOMY;  Surgeon: Maisie Fus;  Location: [facility];  Service: Gynecology;  Laterality: N/A;  . LEFT AND RIGHT HEART CATHETERIZATION WITH CORONARY ANGIOGRAM N/A 06/27/2013   Procedure: LEFT AND RIGHT HEART CATHETERIZATION WITH CORONARY ANGIOGRAM;  Surgeon: Ramond Dial, MD;  Location: [facility] CATH LAB;  Service: Cardiovascular;  Laterality: N/A;  . NOSE SURGERY    . SALPINGOOPHORECTOMY  06/21/2011   Procedure: SALPINGO OOPHERECTOMY;  Surgeon: Maisie Fus;  Location: [facility];  Service: Gynecology;  Laterality: Bilateral;  . VAGINAL PROLAPSE REPAIR  06/21/2011   Procedure: SACROPEXY;  Surgeon: Maisie Fus;  Location: [facility];  Service: Gynecology;  Laterality: N/A;  sacrospinous ligament suspension    Social History   Social History  . Marital status: Married    Spouse name: Jenny Reichmann  . Number of children: 1  . Years of education: N/A   Occupational History  . retired Network engineer   .  Retired   Social History Main Topics  . Smoking status: Former Smoker    Packs/day: 2.00    Years: 15.00    Types: Cigarettes    Quit date: 09/18/1972  . Smokeless tobacco: Never Used  . Alcohol use No  . Drug use: No  . Sexual activity: No   Other Topics Concern  . Not on file   Social History Narrative  . No narrative on file    Family History  Problem Relation Age of Onset  . Emphysema Father   . Colon cancer Neg Hx   . Esophageal cancer Neg Hx   . Rectal cancer Neg Hx   . Stomach cancer Neg Hx     ROS: Recent bronchitis but no fevers or chills, hemoptysis, dysphasia, odynophagia, melena, hematochezia, dysuria, hematuria, rash, seizure activity, orthopnea, PND, pedal edema, claudication. Remaining systems are negative.  Physical Exam: Well-developed well-nourished in no acute distress.  Skin is warm and dry.  HEENT is normal.  Neck is supple.  Chest with diminished breath sounds and mild rhonchi. Status post sternotomy Cardiovascular exam is regular  rate and rhythm.  Abdominal exam nontender or distended. No masses palpated. Extremities show no edema. neuro grossly intact  ECG-Sinus rhythm at a rate of 73. Left anterior fascicular block. Right bundle branch block.  A/P  1 Coronary artery disease status post coronary artery bypass graft-continue aspirin and statin.  2 status post aortic valve replacement-continue SBE prophylaxis.  3 hyperlipidemia-continue statin.  4 hypertension-blood pressure mildly elevated today but she checks this at home. Systolic blood pressure typically 130 to 140. Continue present medications and follow. Add additional medicines as needed.  5 carotid artery disease continue aspirin and statin. I will await final results of follow-up carotid Dopplers performed  today.  Kirk Ruths, MD

## 2016-11-08 ENCOUNTER — Ambulatory Visit: Payer: Medicare Other | Admitting: Cardiology

## 2016-11-13 ENCOUNTER — Encounter: Payer: Self-pay | Admitting: Cardiology

## 2016-11-13 ENCOUNTER — Ambulatory Visit (HOSPITAL_COMMUNITY)
Admission: RE | Admit: 2016-11-13 | Discharge: 2016-11-13 | Disposition: A | Discharge status: Home / Self Care | Payer: Medicare Other | Source: Ambulatory Visit | Attending: Cardiology | Admitting: Cardiology

## 2016-11-13 ENCOUNTER — Ambulatory Visit (INDEPENDENT_AMBULATORY_CARE_PROVIDER_SITE_OTHER): Payer: Medicare Other | Admitting: Cardiology

## 2016-11-13 VITALS — BP 160/78 | HR 73 | Ht 60.0 in | Wt 103.0 lb

## 2016-11-13 DIAGNOSIS — I10 Essential (primary) hypertension: Secondary | ICD-10-CM

## 2016-11-13 DIAGNOSIS — I2510 Atherosclerotic heart disease of native coronary artery without angina pectoris: Secondary | ICD-10-CM | POA: Diagnosis not present

## 2016-11-13 DIAGNOSIS — Z952 Presence of prosthetic heart valve: Secondary | ICD-10-CM | POA: Diagnosis not present

## 2016-11-13 DIAGNOSIS — I6523 Occlusion and stenosis of bilateral carotid arteries: Secondary | ICD-10-CM | POA: Diagnosis not present

## 2016-11-13 NOTE — Patient Instructions (Signed)
Your physician wants you to follow-up in: ONE YEAR WITH DR CRENSHAW You will receive a reminder letter in the mail two months in advance. If you don't receive a letter, please call our office to schedule the follow-up appointment.   If you need a refill on your cardiac medications before your next appointment, please call your pharmacy.  

## 2016-11-19 ENCOUNTER — Encounter (HOSPITAL_COMMUNITY): Payer: Self-pay

## 2016-11-19 ENCOUNTER — Inpatient Hospital Stay (HOSPITAL_COMMUNITY)
Admission: EM | Admit: 2016-11-19 | Discharge: 2016-11-23 | DRG: 193 | Disposition: A | Discharge status: Home / Self Care | Payer: Medicare Other | Attending: Internal Medicine | Admitting: Internal Medicine

## 2016-11-19 ENCOUNTER — Emergency Department (HOSPITAL_COMMUNITY): Payer: Medicare Other

## 2016-11-19 DIAGNOSIS — E1165 Type 2 diabetes mellitus with hyperglycemia: Secondary | ICD-10-CM | POA: Diagnosis present

## 2016-11-19 DIAGNOSIS — I5032 Chronic diastolic (congestive) heart failure: Secondary | ICD-10-CM | POA: Diagnosis present

## 2016-11-19 DIAGNOSIS — E785 Hyperlipidemia, unspecified: Secondary | ICD-10-CM | POA: Diagnosis present

## 2016-11-19 DIAGNOSIS — I082 Rheumatic disorders of both aortic and tricuspid valves: Secondary | ICD-10-CM | POA: Diagnosis present

## 2016-11-19 DIAGNOSIS — I272 Pulmonary hypertension, unspecified: Secondary | ICD-10-CM | POA: Diagnosis present

## 2016-11-19 DIAGNOSIS — F329 Major depressive disorder, single episode, unspecified: Secondary | ICD-10-CM | POA: Diagnosis present

## 2016-11-19 DIAGNOSIS — E1122 Type 2 diabetes mellitus with diabetic chronic kidney disease: Secondary | ICD-10-CM | POA: Diagnosis present

## 2016-11-19 DIAGNOSIS — IMO0002 Reserved for concepts with insufficient information to code with codable children: Secondary | ICD-10-CM | POA: Diagnosis present

## 2016-11-19 DIAGNOSIS — Z951 Presence of aortocoronary bypass graft: Secondary | ICD-10-CM

## 2016-11-19 DIAGNOSIS — I130 Hypertensive heart and chronic kidney disease with heart failure and stage 1 through stage 4 chronic kidney disease, or unspecified chronic kidney disease: Secondary | ICD-10-CM | POA: Diagnosis present

## 2016-11-19 DIAGNOSIS — I2510 Atherosclerotic heart disease of native coronary artery without angina pectoris: Secondary | ICD-10-CM | POA: Diagnosis present

## 2016-11-19 DIAGNOSIS — J189 Pneumonia, unspecified organism: Secondary | ICD-10-CM | POA: Diagnosis present

## 2016-11-19 DIAGNOSIS — J9601 Acute respiratory failure with hypoxia: Secondary | ICD-10-CM | POA: Diagnosis present

## 2016-11-19 DIAGNOSIS — Z888 Allergy status to other drugs, medicaments and biological substances status: Secondary | ICD-10-CM

## 2016-11-19 DIAGNOSIS — I5033 Acute on chronic diastolic (congestive) heart failure: Secondary | ICD-10-CM | POA: Diagnosis present

## 2016-11-19 DIAGNOSIS — Z881 Allergy status to other antibiotic agents status: Secondary | ICD-10-CM

## 2016-11-19 DIAGNOSIS — Z79899 Other long term (current) drug therapy: Secondary | ICD-10-CM

## 2016-11-19 DIAGNOSIS — Z7984 Long term (current) use of oral hypoglycemic drugs: Secondary | ICD-10-CM

## 2016-11-19 DIAGNOSIS — N183 Chronic kidney disease, stage 3 unspecified: Secondary | ICD-10-CM | POA: Diagnosis present

## 2016-11-19 DIAGNOSIS — I10 Essential (primary) hypertension: Secondary | ICD-10-CM | POA: Diagnosis present

## 2016-11-19 DIAGNOSIS — J449 Chronic obstructive pulmonary disease, unspecified: Secondary | ICD-10-CM | POA: Diagnosis present

## 2016-11-19 DIAGNOSIS — H409 Unspecified glaucoma: Secondary | ICD-10-CM | POA: Diagnosis present

## 2016-11-19 DIAGNOSIS — J441 Chronic obstructive pulmonary disease with (acute) exacerbation: Secondary | ICD-10-CM | POA: Diagnosis present

## 2016-11-19 DIAGNOSIS — Z825 Family history of asthma and other chronic lower respiratory diseases: Secondary | ICD-10-CM

## 2016-11-19 DIAGNOSIS — J440 Chronic obstructive pulmonary disease with acute lower respiratory infection: Secondary | ICD-10-CM | POA: Diagnosis present

## 2016-11-19 DIAGNOSIS — K449 Diaphragmatic hernia without obstruction or gangrene: Secondary | ICD-10-CM | POA: Diagnosis present

## 2016-11-19 DIAGNOSIS — Z87891 Personal history of nicotine dependence: Secondary | ICD-10-CM

## 2016-11-19 DIAGNOSIS — Z90710 Acquired absence of both cervix and uterus: Secondary | ICD-10-CM

## 2016-11-19 DIAGNOSIS — Z952 Presence of prosthetic heart valve: Secondary | ICD-10-CM

## 2016-11-19 DIAGNOSIS — K219 Gastro-esophageal reflux disease without esophagitis: Secondary | ICD-10-CM | POA: Diagnosis present

## 2016-11-19 DIAGNOSIS — Z7951 Long term (current) use of inhaled steroids: Secondary | ICD-10-CM

## 2016-11-19 DIAGNOSIS — Z7982 Long term (current) use of aspirin: Secondary | ICD-10-CM

## 2016-11-19 DIAGNOSIS — Z87442 Personal history of urinary calculi: Secondary | ICD-10-CM

## 2016-11-19 DIAGNOSIS — J9621 Acute and chronic respiratory failure with hypoxia: Secondary | ICD-10-CM | POA: Diagnosis present

## 2016-11-19 LAB — BASIC METABOLIC PANEL
Anion gap: 13 (ref 5–15)
BUN: 25 mg/dL — AB (ref 6–20)
CALCIUM: 8.9 mg/dL (ref 8.9–10.3)
CO2: 25 mmol/L (ref 22–32)
CREATININE: 1.71 mg/dL — AB (ref 0.44–1.00)
Chloride: 101 mmol/L (ref 101–111)
GFR calc Af Amer: 31 mL/min — ABNORMAL LOW (ref >60)
GFR, EST NON AFRICAN AMERICAN: 27 mL/min — AB (ref >60)
Glucose, Bld: 261 mg/dL — ABNORMAL HIGH (ref 65–99)
POTASSIUM: 3.6 mmol/L (ref 3.5–5.1)
SODIUM: 139 mmol/L (ref 135–145)

## 2016-11-19 LAB — CBC
HCT: 37.5 % (ref 36.0–46.0)
Hemoglobin: 11.3 g/dL — ABNORMAL LOW (ref 12.0–15.0)
MCH: 25.7 pg — ABNORMAL LOW (ref 26.0–34.0)
MCHC: 30.1 g/dL (ref 30.0–36.0)
MCV: 85.2 fL (ref 78.0–100.0)
PLATELETS: 216 10*3/uL (ref 150–400)
RBC: 4.4 MIL/uL (ref 3.87–5.11)
RDW: 15.4 % (ref 11.5–15.5)
WBC: 13.2 10*3/uL — AB (ref 4.0–10.5)

## 2016-11-19 LAB — GLUCOSE, CAPILLARY
Glucose-Capillary: 384 mg/dL — ABNORMAL HIGH (ref 65–99)
Glucose-Capillary: 260 mg/dL — ABNORMAL HIGH (ref 65–99)

## 2016-11-19 LAB — I-STAT TROPONIN, ED: TROPONIN I, POC: 0.03 ng/mL (ref 0.00–0.08)

## 2016-11-19 LAB — INFLUENZA PANEL BY PCR (TYPE A & B)
Influenza A By PCR: NEGATIVE
Influenza B By PCR: NEGATIVE

## 2016-11-19 LAB — MAGNESIUM: MAGNESIUM: 2.2 mg/dL (ref 1.7–2.4)

## 2016-11-19 LAB — PROCALCITONIN: Procalcitonin: 0.3 ng/mL

## 2016-11-19 LAB — STREP PNEUMONIAE URINARY ANTIGEN: Strep Pneumo Urinary Antigen: NEGATIVE

## 2016-11-19 LAB — SEDIMENTATION RATE: Sed Rate: 36 mm/hr — ABNORMAL HIGH (ref 0–22)

## 2016-11-19 LAB — BRAIN NATRIURETIC PEPTIDE: B NATRIURETIC PEPTIDE 5: 436.4 pg/mL — AB (ref 0.0–100.0)

## 2016-11-19 MED ORDER — IRBESARTAN 150 MG PO TABS
150.0000 mg | ORAL_TABLET | Freq: Every day | ORAL | Status: DC
  Administered 2016-11-20 – 2016-11-23 (×4): 150 mg via ORAL
  Filled 2016-11-19 (×4): qty 1

## 2016-11-19 MED ORDER — GLIMEPIRIDE 1 MG PO TABS
1.0000 mg | ORAL_TABLET | Freq: Every day | ORAL | Status: DC
  Administered 2016-11-20 – 2016-11-22 (×3): 1 mg via ORAL
  Filled 2016-11-19 (×5): qty 1

## 2016-11-19 MED ORDER — INSULIN ASPART 100 UNIT/ML ~~LOC~~ SOLN
0.0000 [IU] | Freq: Three times a day (TID) | SUBCUTANEOUS | Status: DC
  Administered 2016-11-19: 9 [IU] via SUBCUTANEOUS
  Administered 2016-11-20 (×2): 2 [IU] via SUBCUTANEOUS
  Administered 2016-11-20 – 2016-11-21 (×2): 3 [IU] via SUBCUTANEOUS
  Administered 2016-11-21: 5 [IU] via SUBCUTANEOUS
  Administered 2016-11-21: 3 [IU] via SUBCUTANEOUS
  Administered 2016-11-22: 1 [IU] via SUBCUTANEOUS
  Administered 2016-11-22: 7 [IU] via SUBCUTANEOUS
  Administered 2016-11-22: 3 [IU] via SUBCUTANEOUS
  Administered 2016-11-23: 1 [IU] via SUBCUTANEOUS

## 2016-11-19 MED ORDER — IPRATROPIUM-ALBUTEROL 0.5-2.5 (3) MG/3ML IN SOLN
3.0000 mL | Freq: Once | RESPIRATORY_TRACT | Status: AC
  Administered 2016-11-19: 3 mL via RESPIRATORY_TRACT
  Filled 2016-11-19: qty 3

## 2016-11-19 MED ORDER — ONDANSETRON HCL 4 MG/2ML IJ SOLN: 4.0000 mg | Freq: Four times a day (QID) | INTRAMUSCULAR | Status: DC | PRN

## 2016-11-19 MED ORDER — SODIUM CHLORIDE 0.9% FLUSH
3.0000 mL | Freq: Two times a day (BID) | INTRAVENOUS | Status: DC
  Administered 2016-11-19 – 2016-11-23 (×8): 3 mL via INTRAVENOUS

## 2016-11-19 MED ORDER — GUAIFENESIN ER 600 MG PO TB12
600.0000 mg | ORAL_TABLET | Freq: Two times a day (BID) | ORAL | Status: DC
  Administered 2016-11-19 – 2016-11-23 (×9): 600 mg via ORAL
  Filled 2016-11-19 (×9): qty 1

## 2016-11-19 MED ORDER — IPRATROPIUM-ALBUTEROL 0.5-2.5 (3) MG/3ML IN SOLN
3.0000 mL | Freq: Four times a day (QID) | RESPIRATORY_TRACT | Status: DC
  Administered 2016-11-19 – 2016-11-23 (×17): 3 mL via RESPIRATORY_TRACT
  Filled 2016-11-19 (×17): qty 3

## 2016-11-19 MED ORDER — MAGNESIUM 250 MG PO TABS: 250.0000 mg | ORAL_TABLET | Freq: Every day | ORAL | Status: DC

## 2016-11-19 MED ORDER — SODIUM CHLORIDE 0.9% FLUSH: 3.0000 mL | INTRAVENOUS | Status: DC | PRN

## 2016-11-19 MED ORDER — MAGNESIUM OXIDE 400 (241.3 MG) MG PO TABS
200.0000 mg | ORAL_TABLET | Freq: Every day | ORAL | Status: DC
  Administered 2016-11-19 – 2016-11-23 (×5): 200 mg via ORAL
  Filled 2016-11-19 (×5): qty 1

## 2016-11-19 MED ORDER — ACETAMINOPHEN 325 MG PO TABS
650.0000 mg | ORAL_TABLET | ORAL | Status: DC | PRN
  Administered 2016-11-22 – 2016-11-23 (×2): 650 mg via ORAL
  Filled 2016-11-19 (×2): qty 2

## 2016-11-19 MED ORDER — ENOXAPARIN SODIUM 40 MG/0.4ML ~~LOC~~ SOLN: 40.0000 mg | SUBCUTANEOUS | Status: DC

## 2016-11-19 MED ORDER — FLUTICASONE PROPIONATE HFA 220 MCG/ACT IN AERO: 2.0000 | INHALATION_SPRAY | Freq: Two times a day (BID) | RESPIRATORY_TRACT | Status: DC

## 2016-11-19 MED ORDER — FAMOTIDINE 20 MG PO TABS
20.0000 mg | ORAL_TABLET | Freq: Every day | ORAL | Status: DC
  Administered 2016-11-20 – 2016-11-23 (×4): 20 mg via ORAL
  Filled 2016-11-19 (×4): qty 1

## 2016-11-19 MED ORDER — AMLODIPINE BESYLATE 10 MG PO TABS
10.0000 mg | ORAL_TABLET | Freq: Every day | ORAL | Status: DC
  Administered 2016-11-20 – 2016-11-23 (×4): 10 mg via ORAL
  Filled 2016-11-19 (×4): qty 1

## 2016-11-19 MED ORDER — ENSURE ENLIVE PO LIQD
237.0000 mL | Freq: Two times a day (BID) | ORAL | Status: DC
  Administered 2016-11-20: 237 mL via ORAL

## 2016-11-19 MED ORDER — DEXTROSE 5 % IV SOLN
500.0000 mg | INTRAVENOUS | Status: DC
  Administered 2016-11-20 – 2016-11-21 (×2): 500 mg via INTRAVENOUS
  Filled 2016-11-19 (×3): qty 500

## 2016-11-19 MED ORDER — ALBUTEROL SULFATE (2.5 MG/3ML) 0.083% IN NEBU
2.5000 mg | INHALATION_SOLUTION | Freq: Once | RESPIRATORY_TRACT | Status: AC
  Administered 2016-11-19: 2.5 mg via RESPIRATORY_TRACT
  Filled 2016-11-19: qty 3

## 2016-11-19 MED ORDER — AZITHROMYCIN 250 MG PO TABS
500.0000 mg | ORAL_TABLET | Freq: Once | ORAL | Status: AC
  Administered 2016-11-19: 500 mg via ORAL
  Filled 2016-11-19: qty 2

## 2016-11-19 MED ORDER — SERTRALINE HCL 100 MG PO TABS
100.0000 mg | ORAL_TABLET | Freq: Every morning | ORAL | Status: DC
  Administered 2016-11-20 – 2016-11-23 (×4): 100 mg via ORAL
  Filled 2016-11-19 (×4): qty 1

## 2016-11-19 MED ORDER — METHYLPREDNISOLONE SODIUM SUCC 125 MG IJ SOLR
60.0000 mg | Freq: Two times a day (BID) | INTRAMUSCULAR | Status: DC
  Administered 2016-11-19 – 2016-11-23 (×8): 60 mg via INTRAVENOUS
  Filled 2016-11-19 (×8): qty 2

## 2016-11-19 MED ORDER — METHYLPREDNISOLONE SODIUM SUCC 125 MG IJ SOLR
125.0000 mg | Freq: Once | INTRAMUSCULAR | Status: AC
  Administered 2016-11-19: 125 mg via INTRAVENOUS
  Filled 2016-11-19: qty 2

## 2016-11-19 MED ORDER — SODIUM CHLORIDE 0.9 % IV SOLN: 250.0000 mL | INTRAVENOUS | Status: DC | PRN

## 2016-11-19 MED ORDER — CEFTRIAXONE SODIUM 1 G IJ SOLR
1.0000 g | INTRAMUSCULAR | Status: DC
  Administered 2016-11-20 – 2016-11-21 (×2): 1 g via INTRAVENOUS
  Filled 2016-11-19 (×2): qty 10

## 2016-11-19 MED ORDER — BUDESONIDE 0.5 MG/2ML IN SUSP
0.5000 mg | Freq: Two times a day (BID) | RESPIRATORY_TRACT | Status: DC
  Administered 2016-11-19 – 2016-11-23 (×8): 0.5 mg via RESPIRATORY_TRACT
  Filled 2016-11-19 (×8): qty 2

## 2016-11-19 MED ORDER — DEXTROSE 5 % IV SOLN
1.0000 g | Freq: Once | INTRAVENOUS | Status: AC
  Administered 2016-11-19: 1 g via INTRAVENOUS
  Filled 2016-11-19: qty 10

## 2016-11-19 MED ORDER — ENOXAPARIN SODIUM 30 MG/0.3ML ~~LOC~~ SOLN
30.0000 mg | SUBCUTANEOUS | Status: DC
  Administered 2016-11-19 – 2016-11-22 (×4): 30 mg via SUBCUTANEOUS
  Filled 2016-11-19 (×4): qty 0.3

## 2016-11-19 MED ORDER — FUROSEMIDE 10 MG/ML IJ SOLN
60.0000 mg | Freq: Once | INTRAMUSCULAR | Status: AC
  Administered 2016-11-19: 60 mg via INTRAVENOUS
  Filled 2016-11-19: qty 6

## 2016-11-19 MED ORDER — PRAVASTATIN SODIUM 40 MG PO TABS
40.0000 mg | ORAL_TABLET | Freq: Every evening | ORAL | Status: DC
  Administered 2016-11-19 – 2016-11-22 (×4): 40 mg via ORAL
  Filled 2016-11-19 (×4): qty 1

## 2016-11-19 MED ORDER — INSULIN ASPART 100 UNIT/ML ~~LOC~~ SOLN
0.0000 [IU] | Freq: Every day | SUBCUTANEOUS | Status: DC
  Administered 2016-11-19: 3 [IU] via SUBCUTANEOUS

## 2016-11-19 NOTE — ED Provider Notes (Signed)
[place] DEPT Provider Note   CSN: [id number] Arrival date & time: 11/19/16  0907     History   Chief Complaint Chief Complaint  Patient presents with  . COPD, Weakness    HPI [name] is a 83 y.o. female.  HPI Patient presents to the emergency room with complaints of cough, congestion and increased shortness of breath. Patient has a history of COPD/bronchitis. She just started having trouble she states about a year ago. She was a smoker in the 20s but did not have any issues with her breathing for a number of years. Patient had an episode of bronchitis about one year ago. The symptoms eventually resolved. Starting a few weeks ago she began having recurrent coughing and congestion. She is also having shortness of breath. She saw her doctor and had an x-ray that did not show pneumonia. She has been taking her inhalers and steroids but her symptoms have persisted. She was seen in the emergency room on February 10.   Patient does not feel like her symptoms have improved. She continues to wheeze. This weekend she started to feel more short of breath and was getting winded just walking around in her home. She also has generalized weakness. She denies any chest pain. She denies any fever.  Past Medical History:  Diagnosis Date  . Anxiety   . Aortic stenosis   . Arthritis   . Asthma    uses inhaler as needed  . Depression   . Diabetes mellitus   . Emphysema of lung ([place])   . GERD (gastroesophageal reflux disease)   . Glaucoma   . Hyperlipidemia   . Hypertension   . [name]   . Murmur   . Nephrolithiasis     Patient Active Problem List   Diagnosis Date Noted  . Hiatal hernia 05/01/2016  . History of bacterial pneumonia 05/01/2016  . COPD exacerbation ([place]) 11/18/2015  . Sepsis ([place]) 11/18/2015  . Acute kidney injury ([place]) 11/18/2015  . Hyperglycemia 11/18/2015  . S/P AVR (aortic valve replacement) 07/23/2013  . S/P CABG x 1 07/23/2013  .  Aortic stenosis 06/10/2013  . Essential hypertension 06/10/2013  . COPD GOLD II  10/26/2011    Past Surgical History:  Procedure Laterality Date  . ABDOMINAL HYSTERECTOMY    . ANKLE SURGERY  1998   d/t fx.  Left ankle  . ANTERIOR AND POSTERIOR REPAIR  06/21/2011   Procedure: ANTERIOR (CYSTOCELE) AND POSTERIOR REPAIR (RECTOCELE);  Surgeon: Maisie Fus;  Location: [facility];  Service: Gynecology;  Laterality: N/A;  . AORTIC VALVE REPLACEMENT N/A 07/17/2013   Procedure: AORTIC VALVE REPLACEMENT (AVR);  Surgeon: Ivin Poot, MD;  Location: [facility];  Service: Open Heart Surgery;  Laterality: N/A;  . CARDIAC CATHETERIZATION    . CARDIAC VALVE REPLACEMENT    . CORONARY ANGIOPLASTY    . CORONARY ARTERY BYPASS GRAFT N/A 07/17/2013   Procedure: CORONARY ARTERY BYPASS GRAFT, TIMES ONE, ON PUMP, USING RIGHT INTERNAL MAMMARY ARTERY.;  Surgeon: Ivin Poot, MD;  Location: [facility];  Service: Open Heart Surgery;  Laterality: N/A;  . FACIAL COSMETIC SURGERY     face lift  . INTRAOPERATIVE TRANSESOPHAGEAL ECHOCARDIOGRAM N/A 07/17/2013   Procedure: INTRAOPERATIVE TRANSESOPHAGEAL ECHOCARDIOGRAM;  Surgeon: Ivin Poot, MD;  Location: [facility];  Service: Open Heart Surgery;  Laterality: N/A;  . LAPAROSCOPIC ASSISTED VAGINAL HYSTERECTOMY  06/21/2011   Procedure: LAPAROSCOPIC ASSISTED VAGINAL HYSTERECTOMY;  Surgeon: Maisie Fus;  Location: [facility];  Service: Gynecology;  Laterality: N/A;  . LEFT AND RIGHT HEART CATHETERIZATION WITH CORONARY ANGIOGRAM N/A 06/27/2013   Procedure: LEFT AND RIGHT HEART CATHETERIZATION WITH CORONARY ANGIOGRAM;  Surgeon: Ramond Dial, MD;  Location: [facility] C[facility];  Service: Cardiovascular;  Laterality: N/A;  . NOSE SURGERY    . SALPINGOOPHORECTOMY  06/21/2011   Procedure: SALPINGO OOPHERECTOMY;  Surgeon: Maisie Fus;  Location: [facility];  Service: Gynecology;  Laterality: Bilateral;  . VAGINAL PROLAPSE REPAIR  06/21/2011   Procedure: SACROPEXY;  Surgeon: Maisie Fus;   Location: [facility];  Service: Gynecology;  Laterality: N/A;  sacrospinous ligament suspension    OB History    No data available       Home Medications    Prior to Admission medications   Medication Sig Start Date End Date Taking? Authorizing Provider  albuterol (PROVENTIL) (2.5 MG/3ML) 0.083% nebulizer solution Take 3 mLs (2.5 mg total) by nebulization every 6 (six) hours as needed for wheezing. 11/23/15   Silver Huguenin Elgergawy, MD  amLODipine (NORVASC) 10 MG tablet Take 1 tablet (10 mg total) by mouth daily. 10/19/15   Lelon Perla, MD  chlorthalidone (HYGROTON) 25 MG tablet Take 25 mg by mouth daily.    Historical Provider, MD  famotidine (PEPCID) 20 MG tablet Take 1 tablet (20 mg total) by mouth daily. 11/23/15   Silver Huguenin Elgergawy, MD  fluticasone (FLOVENT HFA) 220 MCG/ACT inhaler Inhale 2 puffs into the lungs 2 (two) times daily.    Historical Provider, MD  glimepiride (AMARYL) 1 MG tablet Take 1 mg by mouth daily with breakfast.    Historical Provider, MD  ibandronate (BONIVA) 150 MG tablet Take 150 mg by mouth every 30 (thirty) days. Take in the morning with a full glass of water, on an empty stomach, and do not take anything else by mouth or lie down for the next 30 min.    Historical Provider, MD  irbesartan (AVAPRO) 150 MG tablet Take 1 tablet (150 mg total) by mouth daily. 10/12/14   Lelon Perla, MD  Magnesium 250 MG TABS Take 250 mg by mouth daily.     Historical Provider, MD  Multiple Vitamins-Minerals (OCUVITE-LUTEIN PO) Take 1 tablet by mouth every morning.     Historical Provider, MD  pravastatin (PRAVACHOL) 20 MG tablet Take 40 mg by mouth every evening.     Historical Provider, MD  predniSONE (DELTASONE) 10 MG tablet 5,4,3,2,1 taper 10/27/16   Fransico Meadow, PA-C  PROAIR HFA [phone] BASE) MCG/ACT inhaler Inhale 2 puffs into the lungs 4 (four) times daily as needed for wheezing or shortness of breath.  02/24/14   Historical Provider, MD  sertraline (ZOLOFT) 50 MG tablet Take 1  tablet (50 mg total) by mouth every morning. Patient taking differently: Take 100 mg by mouth every morning.  08/05/13   Lavon Paganini Angiulli, PA-C    Family History Family History  Problem Relation Age of Onset  . Emphysema Father   . Colon cancer Neg Hx   . Esophageal cancer Neg Hx   . Rectal cancer Neg Hx   . Stomach cancer Neg Hx     Social History Social History  Substance Use Topics  . Smoking status: Former Smoker    Packs/day: 2.00    Years: 15.00    Types: Cigarettes    Quit date: 09/18/1972  . Smokeless tobacco: Never Used  . Alcohol use No     Allergies   Amaryl [glimepiride]; Avelox [moxifloxacin  hcl in nacl]; Ciprofloxacin; Lipitor [atorvastatin]; Lisinopril; Other; Prevacid [lansoprazole]; Spiriva handihaler [tiotropium bromide monohydrate]; and Symbicort [budesonide-formoterol fumarate]   Review of Systems Review of Systems  All other systems reviewed and are negative.    Physical Exam Updated Vital Signs BP 136/69 (BP Location: Left Arm)   Pulse 88   Temp 97.7 F (36.5 C) (Oral)   Resp 22   SpO2 91%   Physical Exam  Constitutional: She appears well-developed and well-nourished. No distress.  HENT:  Head: Normocephalic and atraumatic.  Right Ear: External ear normal.  Left Ear: External ear normal.  Eyes: Conjunctivae are normal. Right eye exhibits no discharge. Left eye exhibits no discharge. No scleral icterus.  Neck: Neck supple. No tracheal deviation present.  Cardiovascular: Normal rate, regular rhythm and intact distal pulses.   Pulmonary/Chest: Accessory muscle usage present. No stridor. No respiratory distress. She has wheezes. She has rhonchi. She has no rales.  Prolonged expiration, able to speak in full sentences  Abdominal: Soft. Bowel sounds are normal. She exhibits no distension. There is no tenderness. There is no rebound and no guarding.  Musculoskeletal: She exhibits no edema or tenderness.  Neurological: She is alert. She has  normal strength. No cranial nerve deficit (no facial droop, extraocular movements intact, no slurred speech) or sensory deficit. She exhibits normal muscle tone. She displays no seizure activity. Coordination normal.  Skin: Skin is warm and dry. No rash noted.  Psychiatric: She has a normal mood and affect.  Nursing note and vitals reviewed.    ED Treatments / Results  Labs (all labs ordered are listed, but only abnormal results are displayed) Labs Reviewed  BASIC METABOLIC PANEL - Abnormal; Notable for the following:       Result Value   Glucose, Bld 261 (*)    BUN 25 (*)    Creatinine, Ser 1.71 (*)    GFR calc non Af Amer 27 (*)    GFR calc Af Amer 31 (*)    All other components within normal limits  CBC - Abnormal; Notable for the following:    WBC 13.2 (*)    Hemoglobin 11.3 (*)    MCH 25.7 (*)    All other components within normal limits  BRAIN NATRIURETIC PEPTIDE - Abnormal; Notable for the following:    B Natriuretic Peptide 436.4 (*)    All other components within normal limits  CULTURE, BLOOD (ROUTINE X 2)  CULTURE, BLOOD (ROUTINE X 2)  CULTURE, EXPECTORATED SPUTUM-ASSESSMENT  GRAM STAIN  HIV ANTIBODY (ROUTINE TESTING)  STREP PNEUMONIAE URINARY ANTIGEN  INFLUENZA PANEL BY PCR (TYPE A & B)  LEGIONELLA PNEUMOPHILA SEROGP 1 UR AG  PROCALCITONIN  SEDIMENTATION RATE  I-STAT TROPOININ, ED     Radiology Dg Chest 2 View  Result Date: 11/19/2016 CLINICAL DATA:  Cough, shortness of breath, weakness, bronchitis x 3-4 weeks. Some nausea experienced, but no vomiting. Hx COPD, diabetes, HTN, previous smoker EXAM: CHEST  2 VIEW COMPARISON:  10/27/2016 FINDINGS: Status post median sternotomy and valve replacement. The heart is normal in size. There are chronic changes at the bases. However increased opacity is identified at both lung bases, raising the question of superimposed acute infiltrates. Emphysematous changes are identified at the apices. IMPRESSION: Suspect acute on  chronic changes at the lung bases consistent with infectious infiltrates. Electronically Signed   By: Nolon Nations M.D.   On: 11/19/2016 10:52    Procedures Procedures (including critical care time)  Medications Ordered in ED Medications  cefTRIAXone (ROCEPHIN)  1 g in dextrose 5 % 50 mL IVPB (not administered)  azithromycin (ZITHROMAX) tablet 500 mg (not administered)  albuterol (PROVENTIL) (2.5 MG/3ML) 0.083% nebulizer solution 2.5 mg (not administered)  ipratropium-albuterol (DUONEB) 0.5-2.5 (3) MG/3ML nebulizer solution 3 mL (not administered)  methylPREDNISolone sodium succinate (SOLU-MEDROL) 125 mg/2 mL injection 60 mg (not administered)  cefTRIAXone (ROCEPHIN) 1 g in dextrose 5 % 50 mL IVPB (not administered)  azithromycin (ZITHROMAX) 500 mg in dextrose 5 % 250 mL IVPB (not administered)  ipratropium-albuterol (DUONEB) 0.5-2.5 (3) MG/3ML nebulizer solution 3 mL (3 mLs Nebulization Given 11/19/16 1051)  methylPREDNISolone sodium succinate (SOLU-MEDROL) 125 mg/2 mL injection 125 mg (125 mg Intravenous Given 11/19/16 1051)     Initial Impression / Assessment and Plan / ED Course  I have reviewed the triage vital signs and the nursing notes.  Pertinent labs & imaging results that were available during my care of the patient were reviewed by me and considered in my medical decision making (see chart for details).   patient presents to the emergency room with complaints of shortness of breath and wheezing. Chest x-ray suggests bilateral pneumonia.  Patient also has significant wheezing on exam. She has a a new oxygen requirement .  Fortunately she does not appear to be in any distress and does not seem to be in need of any additional respiratory support. She was treated with albuterol Atrovent treatments. The patient was started on IV antibiotics for  Pneumonia.  No signs of sepsis. Plan on admission to the hospital for further treatment and evaluation  Final Clinical Impressions(s) / ED  Diagnoses   Final diagnoses:  Community acquired pneumonia, unspecified laterality  Chronic obstructive pulmonary disease, unspecified COPD type ([place])    New Prescriptions New Prescriptions   No medications on file     Dorie Rank, MD 11/19/16 1217

## 2016-11-19 NOTE — Progress Notes (Signed)
Received report on pt.

## 2016-11-19 NOTE — ED Triage Notes (Signed)
Patient here with increased shortness of breath and increased wheezing since being diagnosed with bronchitis 3 weeks ago, audible wheezing and weakness on arrival

## 2016-11-19 NOTE — ED Notes (Signed)
Patient transported to X-ray 

## 2016-11-19 NOTE — H&P (Signed)
History and Physical    AMARRAH MEINHART OFB:[id number] DOB: [birth year] DOA: 11/19/2016   PCP: [name], MD   Patient coming from/Resides with: Private residence/husband  Admission status: Observation/telemetry -it may be medically necessary to stay a minimum 2 midnights to rule out impending and/or unexpected changes in physiologic status that may differ from initial evaluation performed in the ER and/or at time of admission therefore please consider reevaluation of admission status 24 hours.   Chief Complaint: Shortness of breath, cough and dyspnea on exertion 3-4 weeks  HPI: [name] is a 83 y.o. female with medical history significant for remote CABG 1, COPD GOLD II, chronic diastolic heart failure in setting of pulmonary hypertension, stage III chronic kidney disease, diabetes on oral medications, dyslipidemia, hypertension who reports 3-4 weeks of sore throat, congestion, shortness of breath and dyspnea on exertion. She has been empirically treated for COPD exacerbation of bronchitis. She has been evaluated by both her PCP (Feb 20) and the ER staff (Feb 10). She returns to the ER due to worsening of her symptoms. Prior x-rays have not demonstrated any evidence of pneumonia. She has not improved with the utilization of nebulizers and steroids.  ED Course:  Vital Signs: BP 117/66   Pulse 75   Temp 97.7 F (36.5 C) (Oral)   Resp 22   SpO2 91%  2 view CXR: Acute on chronic changes bilateral lung bases concerning for infectious infiltrates Lab data: Sodium 139, potassium 3.6, chloride 101, CO2 25, glucose 261, BUN 25, creatinine 1.71, anion gap 13, BNP 436, poc troponin 0.03, white count 13,200 range not obtained, hemoglobin limb 0.1, platelets 216,000 Medications and treatments: DuoNeb 1, Solu Medrol 125 mg IV 1, Rocephin IV 1 g 1, Zithromax 500 mg IV 1, albuterol neb 2.5 mg 1  Review of Systems:  In addition to the HPI above,  No Fever-chills, myalgias or other  constitutional symptoms No Headache, changes with Vision or hearing, new weakness, tingling, numbness in any extremity, dizziness, dysarthria or word finding difficulty, gait disturbance or imbalance, tremors or seizure activity No problems swallowing food or Liquids, indigestion/reflux, choking or coughing while eating, abdominal pain with or after eating No Chest pain, palpitations No Abdominal pain, N/V, melena,hematochezia, dark tarry stools, constipation No dysuria, malodorous urine, hematuria or flank pain No new skin rashes, lesions, masses or bruises, No new joint pains, aches, swelling or redness No recent unintentional weight gain or loss No polyuria, polydypsia or polyphagia   Past Medical History:  Diagnosis Date  . Anxiety   . Aortic stenosis   . Arthritis   . Asthma    uses inhaler as needed  . Depression   . Diabetes mellitus   . Emphysema of lung ([place])   . GERD (gastroesophageal reflux disease)   . Glaucoma   . Hyperlipidemia   . Hypertension   . [name]   . Murmur   . Nephrolithiasis     Past Surgical History:  Procedure Laterality Date  . ABDOMINAL HYSTERECTOMY    . ANKLE SURGERY  1998   d/t fx.  Left ankle  . ANTERIOR AND POSTERIOR REPAIR  06/21/2011   Procedure: ANTERIOR (CYSTOCELE) AND POSTERIOR REPAIR (RECTOCELE);  Surgeon: Maisie Fus;  Location: [facility];  Service: Gynecology;  Laterality: N/A;  . AORTIC VALVE REPLACEMENT N/A 07/17/2013   Procedure: AORTIC VALVE REPLACEMENT (AVR);  Surgeon: Ivin Poot, MD;  Location: [facility];  Service: Open Heart Surgery;  Laterality: N/A;  . CARDIAC  CATHETERIZATION    . CARDIAC VALVE REPLACEMENT    . CORONARY ANGIOPLASTY    . CORONARY ARTERY BYPASS GRAFT N/A 07/17/2013   Procedure: CORONARY ARTERY BYPASS GRAFT, TIMES ONE, ON PUMP, USING RIGHT INTERNAL MAMMARY ARTERY.;  Surgeon: Ivin Poot, MD;  Location: [facility];  Service: Open Heart Surgery;  Laterality: N/A;  . FACIAL COSMETIC SURGERY      face lift  . INTRAOPERATIVE TRANSESOPHAGEAL ECHOCARDIOGRAM N/A 07/17/2013   Procedure: INTRAOPERATIVE TRANSESOPHAGEAL ECHOCARDIOGRAM;  Surgeon: Ivin Poot, MD;  Location: [facility];  Service: Open Heart Surgery;  Laterality: N/A;  . LAPAROSCOPIC ASSISTED VAGINAL HYSTERECTOMY  06/21/2011   Procedure: LAPAROSCOPIC ASSISTED VAGINAL HYSTERECTOMY;  Surgeon: Maisie Fus;  Location: [facility];  Service: Gynecology;  Laterality: N/A;  . LEFT AND RIGHT HEART CATHETERIZATION WITH CORONARY ANGIOGRAM N/A 06/27/2013   Procedure: LEFT AND RIGHT HEART CATHETERIZATION WITH CORONARY ANGIOGRAM;  Surgeon: Ramond Dial, MD;  Location: [facility];  Service: Cardiovascular;  Laterality: N/A;  . NOSE SURGERY    . SALPINGOOPHORECTOMY  06/21/2011   Procedure: SALPINGO OOPHERECTOMY;  Surgeon: Maisie Fus;  Location: [facility];  Service: Gynecology;  Laterality: Bilateral;  . VAGINAL PROLAPSE REPAIR  06/21/2011   Procedure: SACROPEXY;  Surgeon: Maisie Fus;  Location: [facility];  Service: Gynecology;  Laterality: N/A;  sacrospinous ligament suspension    Social History   Social History  . Marital status: Married    Spouse name: Jenny Reichmann  . Number of children: 1  . Years of education: N/A   Occupational History  . retired Network engineer   .  Retired   Social History Main Topics  . Smoking status: Former Smoker    Packs/day: 2.00    Years: 15.00    Types: Cigarettes    Quit date: 09/18/1972  . Smokeless tobacco: Never Used  . Alcohol use No  . Drug use: No  . Sexual activity: No   Other Topics Concern  . Not on file   Social History Narrative  . No narrative on file    Mobility: Without assisted devices Work history: N/A   Allergies  Allergen Reactions  . Amaryl [Glimepiride]     Side effects  . Avelox [Moxifloxacin Hcl In Nacl]     nausea  . Ciprofloxacin     nausea  . Lipitor [Atorvastatin] Other (See Comments)    myalgias myalgias  . Lisinopril Other (See Comments)    cough cough  .  Other Other (See Comments)    Increased cough  . Prevacid [Lansoprazole]     diarrhea  . Spiriva Handihaler [Tiotropium Bromide Monohydrate]     Increased cough  . Symbicort [Budesonide-Formoterol Fumarate]     Increased cough    Family History  Problem Relation Age of Onset  . Emphysema Father   . Colon cancer Neg Hx   . Esophageal cancer Neg Hx   . Rectal cancer Neg Hx   . Stomach cancer Neg Hx    t   Prior to Admission medications   Medication Sig Start Date End Date Taking? Authorizing Provider  albuterol (PROVENTIL) (2.5 MG/3ML) 0.083% nebulizer solution Take 3 mLs (2.5 mg total) by nebulization every 6 (six) hours as needed for wheezing. 11/23/15   Silver Huguenin Elgergawy, MD  amLODipine (NORVASC) 10 MG tablet Take 1 tablet (10 mg total) by mouth daily. 10/19/15   Lelon Perla, MD  chlorthalidone (HYGROTON) 25 MG tablet Take 25 mg by mouth daily.    Historical  Provider, MD  famotidine (PEPCID) 20 MG tablet Take 1 tablet (20 mg total) by mouth daily. 11/23/15   Silver Huguenin Elgergawy, MD  fluticasone (FLOVENT HFA) 220 MCG/ACT inhaler Inhale 2 puffs into the lungs 2 (two) times daily.    Historical Provider, MD  glimepiride (AMARYL) 1 MG tablet Take 1 mg by mouth daily with breakfast.    Historical Provider, MD  ibandronate (BONIVA) 150 MG tablet Take 150 mg by mouth every 30 (thirty) days. Take in the morning with a full glass of water, on an empty stomach, and do not take anything else by mouth or lie down for the next 30 min.    Historical Provider, MD  irbesartan (AVAPRO) 150 MG tablet Take 1 tablet (150 mg total) by mouth daily. 10/12/14   Lelon Perla, MD  Magnesium 250 MG TABS Take 250 mg by mouth daily.     Historical Provider, MD  Multiple Vitamins-Minerals (OCUVITE-LUTEIN PO) Take 1 tablet by mouth every morning.     Historical Provider, MD  pravastatin (PRAVACHOL) 20 MG tablet Take 40 mg by mouth every evening.     Historical Provider, MD  predniSONE (DELTASONE) 10 MG tablet  5,4,3,2,1 taper 10/27/16   Fransico Meadow, PA-C  PROAIR HFA 108 [phone] BASE) MCG/ACT inhaler Inhale 2 puffs into the lungs 4 (four) times daily as needed for wheezing or shortness of breath.  02/24/14   Historical Provider, MD  sertraline (ZOLOFT) 50 MG tablet Take 1 tablet (50 mg total) by mouth every morning. Patient taking differently: Take 100 mg by mouth every morning.  08/05/13   Cathlyn Parsons, PA-C    Physical Exam: Vitals:   11/19/16 0915 11/19/16 1200  BP: 136/69 117/66  Pulse: 88 75  Resp: 22 22  Temp: 97.7 F (36.5 C)   TempSrc: Oral   SpO2: 91% 91%      Constitutional: NAD, calm, comfortableWith recurrent wet sounding nonproductive cough Eyes: PERRL, lids and conjunctivae normal ENMT: Mucous membranes are moist. Posterior pharynx clear of any exudate or lesions. Normal dentition.  Neck: normal, supple, no masses, no thyromegaly-no palpable cervical lymphadenopathy but patient reports tenderness with palpation over right lateral neck just below the jawline Respiratory: Coarse to auscultation diminished in the bases with coarse bilateral expiratory wheezes that are scattered and bibasilar crackles, nasal cannula oxygen 2 L, Normal respiratory effort at rest without accessory muscle use.  Cardiovascular: Regular rate and rhythm, no murmurs / rubs / gallops. No extremity edema. 2+ pedal pulses. No carotid bruits.  Abdomen: no tenderness, no masses palpated. No hepatosplenomegaly. Bowel sounds positive.  Musculoskeletal: no clubbing / cyanosis. No joint deformity upper and lower extremities. Good ROM, no contractures. Normal muscle tone.  Skin: no rashes, lesions, ulcers. No induration Neurologic: CN 2-12 grossly intact. Sensation intact, DTR normal. Strength 5/5 x all 4 extremities.  Psychiatric: Normal judgment and insight. Alert and oriented x 3. Normal mood.    Labs on Admission: I have personally reviewed following labs and imaging studies  CBC:  Recent Labs Lab  11/19/16 0955  WBC 13.2*  HGB 11.3*  HCT 37.5  MCV 85.2  PLT 450   Basic Metabolic Panel:  Recent Labs Lab 11/19/16 0955  NA 139  K 3.6  CL 101  CO2 25  GLUCOSE 261*  BUN 25*  CREATININE 1.71*  CALCIUM 8.9   GFR: Estimated Creatinine Clearance: 18.2 mL/min (by C-G formula based on SCr of 1.71 mg/dL (H)). Liver Function Tests: No results for input(s):  AST, ALT, ALKPHOS, BILITOT, PROT, ALBUMIN in the last 168 hours. No results for input(s): LIPASE, AMYLASE in the last 168 hours. No results for input(s): AMMONIA in the last 168 hours. Coagulation Profile: No results for input(s): INR, PROTIME in the last 168 hours. Cardiac Enzymes: No results for input(s): CKTOTAL, CKMB, CKMBINDEX, TROPONINI in the last 168 hours. BNP (last 3 results) No results for input(s): PROBNP in the last 8760 hours. HbA1C: No results for input(s): HGBA1C in the last 72 hours. CBG: No results for input(s): GLUCAP in the last 168 hours. Lipid Profile: No results for input(s): CHOL, HDL, LDLCALC, TRIG, CHOLHDL, LDLDIRECT in the last 72 hours. Thyroid Function Tests: No results for input(s): TSH, T4TOTAL, FREET4, T3FREE, THYROIDAB in the last 72 hours. Anemia Panel: No results for input(s): VITAMINB12, FOLATE, FERRITIN, TIBC, IRON, RETICCTPCT in the last 72 hours. Urine analysis:    Component Value Date/Time   COLORURINE YELLOW 10/27/2016 [place] 10/27/2016 1033   LABSPEC 1.012 10/27/2016 1033   PHURINE 5.0 10/27/2016 1033   GLUCOSEU NEGATIVE 10/27/2016 1033   HGBUR NEGATIVE 10/27/2016 1033   BILIRUBINUR NEGATIVE 10/27/2016 1033   KETONESUR NEGATIVE 10/27/2016 1033   PROTEINUR NEGATIVE 10/27/2016 1033   UROBILINOGEN 0.2 07/11/2013 1416   NITRITE NEGATIVE 10/27/2016 1033   LEUKOCYTESUR NEGATIVE 10/27/2016 1033   Sepsis Labs: '@LABRCNTIP'$ (procalcitonin:4,lacticidven:4) )No results found for this or any previous visit (from the past 240 hour(s)).   Radiological Exams on  Admission: Dg Chest 2 View  Result Date: 11/19/2016 CLINICAL DATA:  Cough, shortness of breath, weakness, bronchitis x 3-4 weeks. Some nausea experienced, but no vomiting. Hx COPD, diabetes, HTN, previous smoker EXAM: CHEST  2 VIEW COMPARISON:  10/27/2016 FINDINGS: Status post median sternotomy and valve replacement. The heart is normal in size. There are chronic changes at the bases. However increased opacity is identified at both lung bases, raising the question of superimposed acute infiltrates. Emphysematous changes are identified at the apices. IMPRESSION: Suspect acute on chronic changes at the lung bases consistent with infectious infiltrates. Electronically Signed   By: Nolon Nations M.D.   On: 11/19/2016 10:52    EKG: (Independently reviewed) sinus rhythm with ventricular rate 80 bpm, QTC 497 ms, right bundle branch block, low voltage R-wave in inferior leads that give the impression of ST segment elevation in the inferior leads but this is unchanged from most recent EKG on 2/26 performed at the cardiologist's office  Assessment/Plan Principal Problem:   Acute respiratory failure with hypoxia  -Multifactorial: COPD exacerbation vs infectious such as viral or bacterial pneumonia vs diastolic heart failure; likely a combination of all 3 -See below regarding specific treatment details -Continue supportive care with oxygen, begin incentive spirometry  Active Problems:   COPD GOLD II w/ acute exacerbation -Duo nebs scheduled every 6 hrs -incentive spirometry as above -Solu-Medrol 60 mg IV every 12 hrs -Supportive care with oxygen-she is oxygen nave-monitor for signs of hypercarbia -Consider obtaining room-air ambulatory pulse oximetry prior to discharge to determine if meets home O2 requirements    CAP (community acquired pneumonia) -Chest x-ray concerning for infectious process -Continue empiric Rocephin and Zithromax -Influenza PCR -Urinary strep and Legionella -We will reports  right-sided sore throat so we'll obtain throat strep A/B culture as well -Blood cultures -Currently with nonproductive cough but have ordered sputum culture -Repeat 2 view CXR in a.m. -Procalcitonin and ESR    Acute on chronic diastolic heart failure /Pulmonary HTN -BNP mildly elevated at 436 -Preadmit was on Lasix  20 mg QOD -Give Lasix 60 mg IV 1 and monitor response especially regarding renal function and improvement in chest x-ray posttreatment -Echocardiogram completed her 2017: Preserved LV function with mild LVH, grade 1 diastolic dysfunction, mild urinary hypertension 41 mmHg, once prior AVR with trace aortic insufficiency and mild tricuspid regurgitation -Echocardiogram this admission -Daily weights & strict I/O -Continue preadmission ARB -Not on beta blocker- ?? 2/2 COPD    CKD (chronic kidney disease), stage III -Renal function stable and at baseline -Follow labs closely with diuresis    Diabetes mellitus type 2, uncontrolled -Current CBGs have been consistently greater than 200 since the initiation of prednisone taper -Continue Amaryl -SSI -HgbA1c     Essential hypertension -Current blood pressure moderately controlled -Continue ARB and Norvasc    S/P CABG x 1 -Currently without reports of chest pain -ST evaluated by cardiologist on 2/26    HLD (hyperlipidemia) -Continue Pravachol      DVT prophylaxis: Lovenox Code Status: Full Family Communication: Husband Disposition Plan: home Consults called: none    Ivone Licht L. ANP-BC [facility] Pager [phone]   If 7PM-7AM, please contact night-coverage [email or web address] Password [email or web address]  11/19/2016, 12:51 PM

## 2016-11-20 ENCOUNTER — Observation Stay (HOSPITAL_BASED_OUTPATIENT_CLINIC_OR_DEPARTMENT_OTHER): Payer: Medicare Other

## 2016-11-20 ENCOUNTER — Observation Stay (HOSPITAL_COMMUNITY): Payer: Medicare Other

## 2016-11-20 DIAGNOSIS — E1122 Type 2 diabetes mellitus with diabetic chronic kidney disease: Secondary | ICD-10-CM | POA: Diagnosis present

## 2016-11-20 DIAGNOSIS — J9601 Acute respiratory failure with hypoxia: Secondary | ICD-10-CM

## 2016-11-20 DIAGNOSIS — K449 Diaphragmatic hernia without obstruction or gangrene: Secondary | ICD-10-CM | POA: Diagnosis present

## 2016-11-20 DIAGNOSIS — N183 Chronic kidney disease, stage 3 (moderate): Secondary | ICD-10-CM | POA: Diagnosis not present

## 2016-11-20 DIAGNOSIS — I509 Heart failure, unspecified: Secondary | ICD-10-CM

## 2016-11-20 DIAGNOSIS — I10 Essential (primary) hypertension: Secondary | ICD-10-CM

## 2016-11-20 DIAGNOSIS — Z951 Presence of aortocoronary bypass graft: Secondary | ICD-10-CM | POA: Diagnosis not present

## 2016-11-20 DIAGNOSIS — I2510 Atherosclerotic heart disease of native coronary artery without angina pectoris: Secondary | ICD-10-CM | POA: Diagnosis present

## 2016-11-20 DIAGNOSIS — E785 Hyperlipidemia, unspecified: Secondary | ICD-10-CM | POA: Diagnosis present

## 2016-11-20 DIAGNOSIS — J189 Pneumonia, unspecified organism: Secondary | ICD-10-CM | POA: Diagnosis present

## 2016-11-20 DIAGNOSIS — Z7951 Long term (current) use of inhaled steroids: Secondary | ICD-10-CM | POA: Diagnosis not present

## 2016-11-20 DIAGNOSIS — Z87442 Personal history of urinary calculi: Secondary | ICD-10-CM | POA: Diagnosis not present

## 2016-11-20 DIAGNOSIS — J449 Chronic obstructive pulmonary disease, unspecified: Secondary | ICD-10-CM | POA: Diagnosis not present

## 2016-11-20 DIAGNOSIS — I5033 Acute on chronic diastolic (congestive) heart failure: Secondary | ICD-10-CM | POA: Diagnosis not present

## 2016-11-20 DIAGNOSIS — E1165 Type 2 diabetes mellitus with hyperglycemia: Secondary | ICD-10-CM | POA: Diagnosis present

## 2016-11-20 DIAGNOSIS — I130 Hypertensive heart and chronic kidney disease with heart failure and stage 1 through stage 4 chronic kidney disease, or unspecified chronic kidney disease: Secondary | ICD-10-CM | POA: Diagnosis present

## 2016-11-20 DIAGNOSIS — J440 Chronic obstructive pulmonary disease with acute lower respiratory infection: Secondary | ICD-10-CM | POA: Diagnosis present

## 2016-11-20 DIAGNOSIS — Z90710 Acquired absence of both cervix and uterus: Secondary | ICD-10-CM | POA: Diagnosis not present

## 2016-11-20 DIAGNOSIS — K219 Gastro-esophageal reflux disease without esophagitis: Secondary | ICD-10-CM | POA: Diagnosis present

## 2016-11-20 DIAGNOSIS — Z87891 Personal history of nicotine dependence: Secondary | ICD-10-CM | POA: Diagnosis not present

## 2016-11-20 DIAGNOSIS — H409 Unspecified glaucoma: Secondary | ICD-10-CM | POA: Diagnosis present

## 2016-11-20 DIAGNOSIS — J441 Chronic obstructive pulmonary disease with (acute) exacerbation: Secondary | ICD-10-CM | POA: Diagnosis not present

## 2016-11-20 DIAGNOSIS — I272 Pulmonary hypertension, unspecified: Secondary | ICD-10-CM | POA: Diagnosis present

## 2016-11-20 DIAGNOSIS — Z825 Family history of asthma and other chronic lower respiratory diseases: Secondary | ICD-10-CM | POA: Diagnosis not present

## 2016-11-20 DIAGNOSIS — Z881 Allergy status to other antibiotic agents status: Secondary | ICD-10-CM | POA: Diagnosis not present

## 2016-11-20 DIAGNOSIS — Z7984 Long term (current) use of oral hypoglycemic drugs: Secondary | ICD-10-CM | POA: Diagnosis not present

## 2016-11-20 DIAGNOSIS — F329 Major depressive disorder, single episode, unspecified: Secondary | ICD-10-CM | POA: Diagnosis present

## 2016-11-20 LAB — HEMOGLOBIN A1C
Hgb A1c MFr Bld: 8 % — ABNORMAL HIGH (ref 4.8–5.6)
MEAN PLASMA GLUCOSE: 183 mg/dL

## 2016-11-20 LAB — BASIC METABOLIC PANEL
Anion gap: 7 (ref 5–15)
BUN: 35 mg/dL — AB (ref 6–20)
CALCIUM: 8.7 mg/dL — AB (ref 8.9–10.3)
CO2: 27 mmol/L (ref 22–32)
Chloride: 104 mmol/L (ref 101–111)
Creatinine, Ser: 1.65 mg/dL — ABNORMAL HIGH (ref 0.44–1.00)
GFR calc Af Amer: 32 mL/min — ABNORMAL LOW (ref >60)
GFR, EST NON AFRICAN AMERICAN: 28 mL/min — AB (ref >60)
Glucose, Bld: 207 mg/dL — ABNORMAL HIGH (ref 65–99)
POTASSIUM: 4.1 mmol/L (ref 3.5–5.1)
SODIUM: 138 mmol/L (ref 135–145)

## 2016-11-20 LAB — GLUCOSE, CAPILLARY
GLUCOSE-CAPILLARY: 159 mg/dL — AB (ref 65–99)
Glucose-Capillary: 242 mg/dL — ABNORMAL HIGH (ref 65–99)
Glucose-Capillary: 194 mg/dL — ABNORMAL HIGH (ref 65–99)
Glucose-Capillary: 107 mg/dL — ABNORMAL HIGH (ref 65–99)

## 2016-11-20 LAB — HIV ANTIBODY (ROUTINE TESTING W REFLEX): HIV Screen 4th Generation wRfx: NONREACTIVE

## 2016-11-20 LAB — ECHOCARDIOGRAM COMPLETE: WEIGHTICAEL: 1681.6 [oz_av]

## 2016-11-20 MED ORDER — FUROSEMIDE 40 MG PO TABS
40.0000 mg | ORAL_TABLET | ORAL | Status: DC
  Administered 2016-11-21 – 2016-11-23 (×2): 40 mg via ORAL
  Filled 2016-11-20 (×3): qty 1

## 2016-11-20 MED ORDER — BENZONATATE 100 MG PO CAPS
100.0000 mg | ORAL_CAPSULE | Freq: Three times a day (TID) | ORAL | Status: DC | PRN
  Administered 2016-11-20 – 2016-11-21 (×2): 100 mg via ORAL
  Filled 2016-11-20 (×2): qty 1

## 2016-11-20 NOTE — Progress Notes (Signed)
Initial Nutrition Assessment  DOCUMENTATION CODES:   Not applicable  INTERVENTION:   -Snacks TID -D/c Ensure Enlive po BID, each supplement provides 350 kcal and 20 grams of protein  NUTRITION DIAGNOSIS:   Increased nutrient needs related to chronic illness (COPD) as evidenced by estimated needs.  GOAL:   Patient will meet greater than or equal to 90% of their needs  MONITOR:   PO intake, Labs, Weight trends, Skin, I & O's  REASON FOR ASSESSMENT:   Malnutrition Screening Tool    ASSESSMENT:   [name] is a 83 y.o. female with a Past Medical History significant for COPD, CK D, high blood pressure and coronary artery disease who presents with shortness of breath. Diagnosis acute hypoxic respiratory failure.  Pt admitted with acute respiratory failure.   Spoke with pt at bedside, who reports poor appetite over the past weeks secondary to bronchitis ("nothing tasted right- I tried to eat and I ended up spitting the food out"). Pt reports that her typically has a fair appetite, consuming several small meals per day. She does not consume Ensure supplements, sharing "I have a fridge full of them, but don't care for them".   Pt shares her UBW is around 105-110#. She reports that she typically loses a 3-5 pounds when she is hospitalized but is able to regain wt once she recovers. Pt has a distant hx of weight loss, which she attributes to multiple medical issues as well as intentional weight loss ("I was too big"). Wt has been stable over the past year. Pt is also small framed in stature.  Nutrition-Focused physical exam completed. Findings are mild fat depletion, mild to moderate muscle depletion, and no edema.   Educated pt on importance of good meal intake to promote healing. She states appetite has improved since admission- consumed 100% of her chicken soup and grilled cheese for lunch and was snacking on ice cream at time of visit. Discussed ways pt could incorporate more  protein sources in her diet to preserve lean body mass and prevent further weight loss. Pt had no other nutritional concerns at time of visit, but expressed appreciation for RD visit.   Labs reviewed.   Diet Order:  Diet heart healthy/carb modified Room service appropriate? Yes; Fluid consistency: Thin  Skin:  Reviewed, no issues  Last BM:  11/19/16  Height:   Ht Readings from Last 1 Encounters:  11/13/16 5' (1.524 m)    Weight:   Wt Readings from Last 1 Encounters:  11/19/16 105 lb 1.6 oz (47.7 kg)    Ideal Body Weight:  45.5 kg  BMI:  Body mass index is 20.53 kg/m.  Estimated Nutritional Needs:   Kcal:  1200-1400  Protein:  50-65 grams  Fluid:  1.2-1.4 L  EDUCATION NEEDS:   Education needs addressed  [name] A. Jimmye Norman, RD, LDN, CDE Pager: [phone] After hours Pager: [phone]

## 2016-11-20 NOTE — Progress Notes (Signed)
  Echocardiogram 2D Echocardiogram has been performed.  Daniesha Driver L Androw 11/20/2016, 11:35 AM

## 2016-11-20 NOTE — Progress Notes (Signed)
Inpatient Diabetes Program Recommendations  AACE/ADA: New Consensus Statement on Inpatient Glycemic Control (2015)  Target Ranges:  Prepandial:   less than 140 mg/dL      Peak postprandial:   less than 180 mg/dL (1-2 hours)      Critically ill patients:  140 - 180 mg/dL   Lab Results  Component Value Date   GLUCAP 242 (H) 11/20/2016   HGBA1C 8.0 (H) 11/19/2016    Review of Glycemic Control  Diabetes history: dm2, steroids this admission, GFR=28  Outpatient Diabetes medications: Amaryl 4 mg daily  Current orders for Inpatient glycemic control: sensitive correction scale Novolog 0-9 units TIDAC and 0-5 units QHS, Amaryl 1 mg daily   Inpatient Diabetes Program Recommendations:   Insulin - Meal Coverage: Please consider Novolog 3 units tidac if patient eats >50% of meal.  Thank you,  Windy Carina, RN, MSN Diabetes Coordinator Inpatient Diabetes Program [phone] (Team Pager)

## 2016-11-20 NOTE — Progress Notes (Signed)
PROGRESS NOTE    [name]  ZJI:[id number] DOB: [birth year] DOA: 11/19/2016 PCP: [name], MD   Outpatient Specialists:    Brief Narrative:  [name] is a 83 y.o. female with medical history significant for remote CABG 1, COPD GOLD II, chronic diastolic heart failure in setting of pulmonary hypertension, stage III chronic kidney disease, diabetes on oral medications, dyslipidemia, hypertension who reports 3-4 weeks of sore throat, congestion, shortness of breath and dyspnea on exertion. She has been empirically treated for COPD exacerbation of bronchitis. She has been evaluated by both her PCP (Feb 20) and the ER staff (Feb 10). She returns to the ER due to worsening of her symptoms. Prior x-rays have not demonstrated any evidence of pneumonia. She has not improved with the utilization of nebulizers and steroids.   Assessment & Plan:   Principal Problem:   Acute respiratory failure with hypoxia (HCC) Active Problems:   COPD GOLD II    Essential hypertension   S/P CABG x 1   COPD exacerbation (HCC)   CKD (chronic kidney disease), stage III   Diabetes mellitus type 2, uncontrolled ([place])   CAP (community acquired pneumonia)   Acute on chronic diastolic heart failure (HCC)   Pulmonary HTN   HLD (hyperlipidemia)   Acute respiratory failure with hypoxia  -Multifactorial: COPD exacerbation vs infectious such as viral or bacterial pneumonia vs diastolic heart failure; likely a combination of all 3 -See below regarding specific treatment details -Continue supportive care with oxygen, begin incentive spirometry    COPD GOLD II w/ acute exacerbation -Duo nebs scheduled every 6 hrs -incentive spirometry as above -Solu-Medrol 60 mg IV every 12 hrs -Supportive care with oxygen- wean as tolerated    CAP (community acquired pneumonia) -Chest x-ray concerning for infectious process -Continue empiric Rocephin and Zithromax -Influenza PCR negative -Urinary strep and  Legionella -Blood cultures -Currently with nonproductive cough but have ordered sputum culture -pro calcitonin elevated    Acute on chronic diastolic heart failure /Pulmonary HTN -BNP mildly elevated at 436 -Preadmit was on Lasix 20 mg QOD - Lasix 60 mg IV 1 with improvement on x ray (increase home dose to '40mg'$ ) -Echocardiogram completed in 2017: Preserved LV function with mild LVH, grade 1 diastolic dysfunction -Echocardiogram done this admission -Daily weights & strict I/O -Continue preadmission ARB    CKD (chronic kidney disease), stage III -Renal function stable and at baseline -Follow labs closely with diuresis    Diabetes mellitus type 2, uncontrolled -Current CBGs have been consistently greater than 200 since the initiation of prednisone taper -Continue Amaryl -SSI    Essential hypertension -Current blood pressure moderately controlled -Continue ARB and Norvasc    S/P CABG x 1 -Currently without reports of chest pain -ST evaluated by cardiologist on 2/26    HLD (hyperlipidemia) -Continue Pravachol   DVT prophylaxis:  Lovenox   Code Status: Full Code  Family Communication: Patient/family  Disposition Plan:  Home in 1-2 days-- patient is still not to baseline-- requiring O2 and wheezing in left lung field   Consultants:    Subjective: Cough becoming more productive   Objective: Vitals:   11/20/16 0231 11/20/16 0614 11/20/16 0755 11/20/16 0759  BP:  (!) 147/64    Pulse:  73    Resp:  20    Temp:  97.7 F (36.5 C)    TempSrc:  Oral    SpO2: 98% 95% 92% 99%  Weight:        Intake/Output Summary (Last 24  hours) at 11/20/16 1214 Last data filed at 11/20/16 2376  Gross per 24 hour  Intake               50 ml  Output              850 ml  Net             -800 ml   Filed Weights   11/19/16 1555  Weight: 47.7 kg (105 lb 1.6 oz)    Examination:  General exam: Appears calm and comfortable  Respiratory system: wheezing b/l  L>R. Cardiovascular system: S1 & S2 heard, RRR. No JVD, murmurs, rubs, gallops or clicks. No pedal edema. Gastrointestinal system: Abdomen is nondistended, soft and nontender. No organomegaly or masses felt. Normal bowel sounds heard. Central nervous system: Alert and oriented. No focal neurological deficits.      Data Reviewed: I have personally reviewed following labs and imaging studies  CBC:  Recent Labs Lab 11/19/16 0955  WBC 13.2*  HGB 11.3*  HCT 37.5  MCV 85.2  PLT 283   Basic Metabolic Panel:  Recent Labs Lab 11/19/16 0955 11/19/16 1630 11/20/16 0524  NA 139  --  138  K 3.6  --  4.1  CL 101  --  104  CO2 25  --  27  GLUCOSE 261*  --  207*  BUN 25*  --  35*  CREATININE 1.71*  --  1.65*  CALCIUM 8.9  --  8.7*  MG  --  2.2  --    GFR: Estimated Creatinine Clearance: 18.9 mL/min (by C-G formula based on SCr of 1.65 mg/dL (H)). Liver Function Tests: No results for input(s): AST, ALT, ALKPHOS, BILITOT, PROT, ALBUMIN in the last 168 hours. No results for input(s): LIPASE, AMYLASE in the last 168 hours. No results for input(s): AMMONIA in the last 168 hours. Coagulation Profile: No results for input(s): INR, PROTIME in the last 168 hours. Cardiac Enzymes: No results for input(s): CKTOTAL, CKMB, CKMBINDEX, TROPONINI in the last 168 hours. BNP (last 3 results) No results for input(s): PROBNP in the last 8760 hours. HbA1C:  Recent Labs  11/19/16 1509  HGBA1C 8.0*   CBG:  Recent Labs Lab 11/19/16 1751 11/19/16 2146 11/20/16 0845 11/20/16 1201  GLUCAP 384* 260* 159* 242*   Lipid Profile: No results for input(s): CHOL, HDL, LDLCALC, TRIG, CHOLHDL, LDLDIRECT in the last 72 hours. Thyroid Function Tests: No results for input(s): TSH, T4TOTAL, FREET4, T3FREE, THYROIDAB in the last 72 hours. Anemia Panel: No results for input(s): VITAMINB12, FOLATE, FERRITIN, TIBC, IRON, RETICCTPCT in the last 72 hours. Urine analysis:    Component Value Date/Time    COLORURINE YELLOW 10/27/2016 [place] 10/27/2016 1033   LABSPEC 1.012 10/27/2016 1033   PHURINE 5.0 10/27/2016 1033   GLUCOSEU NEGATIVE 10/27/2016 1033   HGBUR NEGATIVE 10/27/2016 1033   BILIRUBINUR NEGATIVE 10/27/2016 1033   KETONESUR NEGATIVE 10/27/2016 1033   PROTEINUR NEGATIVE 10/27/2016 1033   UROBILINOGEN 0.2 07/11/2013 1416   NITRITE NEGATIVE 10/27/2016 [place] 10/27/2016 1033     )No results found for this or any previous visit (from the past 240 hour(s)).    Anti-infectives    Start     Dose/Rate Route Frequency Ordered Stop   11/20/16 1000  cefTRIAXone (ROCEPHIN) 1 g in dextrose 5 % 50 mL IVPB     1 g 100 mL/hr over 30 Minutes Intravenous Every 24 hours 11/19/16 1216 11/27/16 0959   11/20/16  1000  azithromycin (ZITHROMAX) 500 mg in dextrose 5 % 250 mL IVPB     500 mg 250 mL/hr over 60 Minutes Intravenous Every 24 hours 11/19/16 1216 11/27/16 0959   11/19/16 1130  cefTRIAXone (ROCEPHIN) 1 g in dextrose 5 % 50 mL IVPB     1 g 100 mL/hr over 30 Minutes Intravenous  Once 11/19/16 1123 11/19/16 1256   11/19/16 1130  azithromycin (ZITHROMAX) tablet 500 mg     500 mg Oral  Once 11/19/16 1123 11/19/16 1227       Radiology Studies: Dg Chest 2 View  Result Date: 11/20/2016 CLINICAL DATA:  Follow-up pneumonia EXAM: CHEST  2 VIEW COMPARISON:  11/19/2016 FINDINGS: Cardiac shadow is stable. Postsurgical changes are again seen. The lungs are hyperinflated consistent with COPD. Chronic changes are noted in the bases with overall improved aeration when compare with the recent exam. No new focal infiltrate is seen. IMPRESSION: Improving aeration in the bases bilaterally. Electronically Signed   By: Inez Catalina M.D.   On: 11/20/2016 10:11   Dg Chest 2 View  Result Date: 11/19/2016 CLINICAL DATA:  Cough, shortness of breath, weakness, bronchitis x 3-4 weeks. Some nausea experienced, but no vomiting. Hx COPD, diabetes, HTN, previous smoker EXAM:  CHEST  2 VIEW COMPARISON:  10/27/2016 FINDINGS: Status post median sternotomy and valve replacement. The heart is normal in size. There are chronic changes at the bases. However increased opacity is identified at both lung bases, raising the question of superimposed acute infiltrates. Emphysematous changes are identified at the apices. IMPRESSION: Suspect acute on chronic changes at the lung bases consistent with infectious infiltrates. Electronically Signed   By: Nolon Nations M.D.   On: 11/19/2016 10:52        Scheduled Meds: . amLODipine  10 mg Oral Daily  . azithromycin  500 mg Intravenous Q24H  . budesonide (PULMICORT) nebulizer solution  0.5 mg Nebulization BID  . cefTRIAXone (ROCEPHIN)  IV  1 g Intravenous Q24H  . enoxaparin (LOVENOX) injection  30 mg Subcutaneous Q24H  . famotidine  20 mg Oral Daily  . feeding supplement (ENSURE ENLIVE)  237 mL Oral BID BM  . glimepiride  1 mg Oral Q breakfast  . guaiFENesin  600 mg Oral BID  . insulin aspart  0-5 Units Subcutaneous QHS  . insulin aspart  0-9 Units Subcutaneous TID WC  . ipratropium-albuterol  3 mL Nebulization Q6H  . irbesartan  150 mg Oral Daily  . magnesium oxide  200 mg Oral Daily  . methylPREDNISolone (SOLU-MEDROL) injection  60 mg Intravenous Q12H  . pravastatin  40 mg Oral QPM  . sertraline  100 mg Oral q morning - 10a  . sodium chloride flush  3 mL Intravenous Q12H   Continuous Infusions:   LOS: 0 days    Time spent: 35 min    Leonore, DO [facility] Pager [phone]  If 7PM-7AM, please contact night-coverage [email or web address] Password [email or web address] 11/20/2016, 12:14 PM

## 2016-11-21 DIAGNOSIS — J441 Chronic obstructive pulmonary disease with (acute) exacerbation: Secondary | ICD-10-CM

## 2016-11-21 DIAGNOSIS — I5033 Acute on chronic diastolic (congestive) heart failure: Secondary | ICD-10-CM

## 2016-11-21 DIAGNOSIS — N183 Chronic kidney disease, stage 3 (moderate): Secondary | ICD-10-CM

## 2016-11-21 LAB — BASIC METABOLIC PANEL
Anion gap: 9 (ref 5–15)
BUN: 43 mg/dL — AB (ref 6–20)
CHLORIDE: 102 mmol/L (ref 101–111)
CO2: 26 mmol/L (ref 22–32)
Calcium: 8.9 mg/dL (ref 8.9–10.3)
Creatinine, Ser: 1.37 mg/dL — ABNORMAL HIGH (ref 0.44–1.00)
GFR calc Af Amer: 40 mL/min — ABNORMAL LOW (ref >60)
GFR calc non Af Amer: 35 mL/min — ABNORMAL LOW (ref >60)
GLUCOSE: 245 mg/dL — AB (ref 65–99)
POTASSIUM: 3.9 mmol/L (ref 3.5–5.1)
Sodium: 137 mmol/L (ref 135–145)

## 2016-11-21 LAB — GLUCOSE, CAPILLARY
GLUCOSE-CAPILLARY: 209 mg/dL — AB (ref 65–99)
GLUCOSE-CAPILLARY: 300 mg/dL — AB (ref 65–99)
GLUCOSE-CAPILLARY: 142 mg/dL — AB (ref 65–99)
Glucose-Capillary: 204 mg/dL — ABNORMAL HIGH (ref 65–99)

## 2016-11-21 LAB — PROCALCITONIN: Procalcitonin: 0.18 ng/mL

## 2016-11-21 MED ORDER — PHENOL 1.4 % MT LIQD
1.0000 | OROMUCOSAL | Status: DC | PRN
  Filled 2016-11-21: qty 177

## 2016-11-21 MED ORDER — DOXYCYCLINE HYCLATE 100 MG PO TABS
100.0000 mg | ORAL_TABLET | Freq: Two times a day (BID) | ORAL | Status: DC
  Administered 2016-11-21 – 2016-11-23 (×4): 100 mg via ORAL
  Filled 2016-11-21 (×4): qty 1

## 2016-11-21 MED ORDER — MENTHOL 3 MG MT LOZG
1.0000 | LOZENGE | OROMUCOSAL | Status: DC | PRN
  Administered 2016-11-21: 3 mg via ORAL
  Filled 2016-11-21 (×2): qty 9

## 2016-11-21 NOTE — Progress Notes (Signed)
Inpatient Diabetes Program Recommendations  AACE/ADA: New Consensus Statement on Inpatient Glycemic Control (2015)  Target Ranges:  Prepandial:   less than 140 mg/dL      Peak postprandial:   less than 180 mg/dL (1-2 hours)      Critically ill patients:  140 - 180 mg/dL   Results for [name], [name] (MRN [id number]) as of 11/21/2016 15:01  Ref. Range 11/20/2016 08:45 11/20/2016 12:01 11/20/2016 17:45 11/20/2016 21:50 11/21/2016 07:49 11/21/2016 12:02  Glucose-Capillary Latest Ref Range: 65 - 99 mg/dL 159 (H) 242 (H) 194 (H) 107 (H) 204 (H) 209 (H)    Home DM Meds: Amaryl 4 mg daily  Current Insulin Orders: Amaryl 1 mg daily      Novolog Sensitive Correction Scale/ SSI (0-9 units) TID AC + HS      MD- Please consider adding Novolog Meal Coverage to current in-hospital insulin regimen:  Novolog 3 units TID with meals (hold if pt eats <50% of meal)     --Will follow patient during hospitalization--  Wyn Quaker RN, MSN, CDE Diabetes Coordinator Inpatient Glycemic Control Team Team Pager: [phone] (8a-5p)

## 2016-11-21 NOTE — Progress Notes (Signed)
PROGRESS NOTE    [name]  ONG:[id number] DOB: 08-Oct-1931 DOA: 11/19/2016 PCP: [name], MD   Outpatient Specialists:    Brief Narrative:  [name] is a 83 y.o. female with medical history significant for remote CABG 1, COPD GOLD II, chronic diastolic heart failure in setting of pulmonary hypertension, stage III chronic kidney disease, diabetes on oral medications, dyslipidemia, hypertension who reports 3-4 weeks of sore throat, congestion, shortness of breath and dyspnea on exertion. She has been empirically treated for COPD exacerbation of bronchitis. She has been evaluated by both her PCP (Feb 20) and the ER staff (Feb 10). She returns to the ER due to worsening of her symptoms. Prior x-rays have not demonstrated any evidence of pneumonia. She has not improved with the utilization of nebulizers and steroids.   Assessment & Plan:   Principal Problem:   Acute respiratory failure with hypoxia (HCC) Active Problems:   COPD GOLD II    Essential hypertension   S/P CABG x 1   COPD exacerbation (HCC)   CKD (chronic kidney disease), stage III   Diabetes mellitus type 2, uncontrolled ([place])   CAP (community acquired pneumonia)   Acute on chronic diastolic heart failure (HCC)   Pulmonary HTN   HLD (hyperlipidemia)   Acute respiratory failure with hypoxia  -Multifactorial: COPD exacerbation vs infectious such as viral or bacterial pneumonia vs diastolic heart failure; likely a combination of all 3 -See below regarding specific treatment details -Continue supportive care with oxygen, begin incentive spirometry    COPD GOLD II w/ acute exacerbation -Duo nebs scheduled every 6 hrs -incentive spirometry as above -Solu-Medrol 60 mg IV every 12 hrs -Supportive care with oxygen- wean as tolerated    CAP (community acquired pneumonia) -Chest x-ray concerning for infectious process -change to PO abx -Influenza PCR negative -Urinary strep negative -Blood cultures  NGTD -pro calcitonin was elevated but improving    Acute on chronic diastolic heart failure /Pulmonary HTN -BNP mildly elevated at 436 -Preadmit was on Lasix 20 mg QOD - Lasix 60 mg IV 1 with improvement on x ray (increase home dose to '40mg'$ ) -Echocardiogram completed in 2017: Preserved LV function with mild LVH, grade 1 diastolic dysfunction -Echocardiogram done this admission -Daily weights & strict I/O -Continue preadmission ARB    CKD (chronic kidney disease), stage III -Renal function stable and at baseline -Follow labs closely with diuresis- CR actually improving    Diabetes mellitus type 2, uncontrolled -Current CBGs have been consistently greater than 200 since the initiation of prednisone taper -Continue Amaryl -SSI    Essential hypertension -Current blood pressure moderately controlled -Continue ARB and Norvasc    S/P CABG x 1 -Currently without reports of chest pain -ST evaluated by cardiologist on 2/26    HLD (hyperlipidemia) -Continue Pravachol   DVT prophylaxis:  Lovenox   Code Status: Full Code  Family Communication: Patient/family  Disposition Plan:  Home in 1-2 days-- patient is still not to baseline-- requiring O2   Consultants:    Subjective: Throat sore with coughing   Objective: Vitals:   11/21/16 0512 11/21/16 0748 11/21/16 0926 11/21/16 0928  BP: (!) 153/72     Pulse: 76 72    Resp: 18 19    Temp: 98.1 F (36.7 C)     TempSrc: Oral     SpO2: 94% 92% 92% 99%  Weight:        Intake/Output Summary (Last 24 hours) at 11/21/16 1314 Last data filed at 11/21/16 4401  Gross per 24 hour  Intake              363 ml  Output                0 ml  Net              363 ml   Filed Weights   11/19/16 1555 11/21/16 0251  Weight: 47.7 kg (105 lb 1.6 oz) 49.2 kg (108 lb 8 oz)    Examination:  General exam: Appears calm and comfortable  Respiratory system: wheezing b/l L>R. Cardiovascular system: S1 & S2 heard, RRR. No JVD,  murmurs, rubs, gallops or clicks. No pedal edema. Gastrointestinal system: Abdomen is nondistended, soft and nontender. No organomegaly or masses felt. Normal bowel sounds heard. Central nervous system: Alert and oriented. No focal neurological deficits.      Data Reviewed: I have personally reviewed following labs and imaging studies  CBC:  Recent Labs Lab 11/19/16 0955  WBC 13.2*  HGB 11.3*  HCT 37.5  MCV 85.2  PLT 638   Basic Metabolic Panel:  Recent Labs Lab 11/19/16 0955 11/19/16 1630 11/20/16 0524 11/21/16 0505  NA 139  --  138 137  K 3.6  --  4.1 3.9  CL 101  --  104 102  CO2 25  --  27 26  GLUCOSE 261*  --  207* 245*  BUN 25*  --  35* 43*  CREATININE 1.71*  --  1.65* 1.37*  CALCIUM 8.9  --  8.7* 8.9  MG  --  2.2  --   --    GFR: Estimated Creatinine Clearance: 22.7 mL/min (by C-G formula based on SCr of 1.37 mg/dL (H)). Liver Function Tests: No results for input(s): AST, ALT, ALKPHOS, BILITOT, PROT, ALBUMIN in the last 168 hours. No results for input(s): LIPASE, AMYLASE in the last 168 hours. No results for input(s): AMMONIA in the last 168 hours. Coagulation Profile: No results for input(s): INR, PROTIME in the last 168 hours. Cardiac Enzymes: No results for input(s): CKTOTAL, CKMB, CKMBINDEX, TROPONINI in the last 168 hours. BNP (last 3 results) No results for input(s): PROBNP in the last 8760 hours. HbA1C:  Recent Labs  11/19/16 1509  HGBA1C 8.0*   CBG:  Recent Labs Lab 11/20/16 1201 11/20/16 1745 11/20/16 2150 11/21/16 0749 11/21/16 1202  GLUCAP 242* 194* 107* 204* 209*   Lipid Profile: No results for input(s): CHOL, HDL, LDLCALC, TRIG, CHOLHDL, LDLDIRECT in the last 72 hours. Thyroid Function Tests: No results for input(s): TSH, T4TOTAL, FREET4, T3FREE, THYROIDAB in the last 72 hours. Anemia Panel: No results for input(s): VITAMINB12, FOLATE, FERRITIN, TIBC, IRON, RETICCTPCT in the last 72 hours. Urine analysis:    Component  Value Date/Time   COLORURINE YELLOW 10/27/2016 [place] 10/27/2016 1033   LABSPEC 1.012 10/27/2016 1033   PHURINE 5.0 10/27/2016 1033   GLUCOSEU NEGATIVE 10/27/2016 1033   HGBUR NEGATIVE 10/27/2016 1033   BILIRUBINUR NEGATIVE 10/27/2016 1033   KETONESUR NEGATIVE 10/27/2016 1033   PROTEINUR NEGATIVE 10/27/2016 1033   UROBILINOGEN 0.2 07/11/2013 1416   NITRITE NEGATIVE 10/27/2016 [place] 10/27/2016 1033     ) Recent Results (from the past 240 hour(s))  Culture, blood (routine x 2) Call MD if unable to obtain prior to antibiotics being given     Status: None (Preliminary result)   Collection Time: 11/19/16  3:06 PM  Result Value Ref Range Status   Specimen Description BLOOD RIGHT ANTECUBITAL  Final   Special Requests BOTTLES DRAWN AEROBIC ONLY 5CC  Final   Culture NO GROWTH < 24 HOURS  Final   Report Status PENDING  Incomplete  Culture, blood (routine x 2) Call MD if unable to obtain prior to antibiotics being given     Status: None (Preliminary result)   Collection Time: 11/19/16  3:17 PM  Result Value Ref Range Status   Specimen Description BLOOD RIGHT ANTECUBITAL  Final   Special Requests BOTTLES DRAWN AEROBIC ONLY 5CC  Final   Culture NO GROWTH < 24 HOURS  Final   Report Status PENDING  Incomplete      Anti-infectives    Start     Dose/Rate Route Frequency Ordered Stop   11/20/16 1000  cefTRIAXone (ROCEPHIN) 1 g in dextrose 5 % 50 mL IVPB     1 g 100 mL/hr over 30 Minutes Intravenous Every 24 hours 11/19/16 1216 11/27/16 0959   11/20/16 1000  azithromycin (ZITHROMAX) 500 mg in dextrose 5 % 250 mL IVPB     500 mg 250 mL/hr over 60 Minutes Intravenous Every 24 hours 11/19/16 1216 11/27/16 0959   11/19/16 1130  cefTRIAXone (ROCEPHIN) 1 g in dextrose 5 % 50 mL IVPB     1 g 100 mL/hr over 30 Minutes Intravenous  Once 11/19/16 1123 11/19/16 1256   11/19/16 1130  azithromycin (ZITHROMAX) tablet 500 mg     500 mg Oral  Once 11/19/16 1123  11/19/16 1227       Radiology Studies: Dg Chest 2 View  Result Date: 11/20/2016 CLINICAL DATA:  Follow-up pneumonia EXAM: CHEST  2 VIEW COMPARISON:  11/19/2016 FINDINGS: Cardiac shadow is stable. Postsurgical changes are again seen. The lungs are hyperinflated consistent with COPD. Chronic changes are noted in the bases with overall improved aeration when compare with the recent exam. No new focal infiltrate is seen. IMPRESSION: Improving aeration in the bases bilaterally. Electronically Signed   By: Inez Catalina M.D.   On: 11/20/2016 10:11        Scheduled Meds: . amLODipine  10 mg Oral Daily  . azithromycin  500 mg Intravenous Q24H  . budesonide (PULMICORT) nebulizer solution  0.5 mg Nebulization BID  . cefTRIAXone (ROCEPHIN)  IV  1 g Intravenous Q24H  . enoxaparin (LOVENOX) injection  30 mg Subcutaneous Q24H  . famotidine  20 mg Oral Daily  . furosemide  40 mg Oral QODAY  . glimepiride  1 mg Oral Q breakfast  . guaiFENesin  600 mg Oral BID  . insulin aspart  0-5 Units Subcutaneous QHS  . insulin aspart  0-9 Units Subcutaneous TID WC  . ipratropium-albuterol  3 mL Nebulization Q6H  . irbesartan  150 mg Oral Daily  . magnesium oxide  200 mg Oral Daily  . methylPREDNISolone (SOLU-MEDROL) injection  60 mg Intravenous Q12H  . pravastatin  40 mg Oral QPM  . sertraline  100 mg Oral q morning - 10a  . sodium chloride flush  3 mL Intravenous Q12H   Continuous Infusions:   LOS: 1 day    Time spent: 35 min    [place], DO [facility] Pager [phone]  If 7PM-7AM, please contact night-coverage [email or web address] Password [email or web address] 11/21/2016, 1:14 PM

## 2016-11-21 NOTE — Care Management (Signed)
SATURATION QUALIFICATIONS: (This note is used to comply with regulatory documentation for home oxygen)  Patient Saturations on Room Air while Ambulating = 82%  Patient Saturations on 2 Liters of oxygen while Ambulating = 93%  Please briefly explain why patient needs home oxygen: Please see previous note from [facility].  NCM checked sats on RA. States she does not have oxygen at home.   Patient Saturations on Room Air at Rest = 89%  Jonnie Finner RN CCM Case Mgmt phone [phone]

## 2016-11-21 NOTE — Care Management Note (Signed)
Case Management Note  Patient Details  Name: [name] MRN: [id number] Date of Birth: 1931/10/02  Subjective/Objective:    Acute resp failure with hypoxia, COPD exacerbation, CKD, CAP               Action/Plan: Discharge Planning: NCM spoke to pt and lives at home with husband. Pt has neb machine, glucometer, portable pulse oximetry, and shower chair at home. Will need oxygen for home.  Waiting final recommendation for PT eval.   PCP WHITE, CYNTHIA MD   Expected Discharge Date:  11/23/16               Expected Discharge Plan:  Home/Self Care  In-House Referral:  NA  Discharge planning Services  CM Consult  Post Acute Care Choice:  NA Choice offered to:  NA  DME Arranged:   DME Agency:    HH Arranged:  NA HH Agency:  NA  Status of Service:  In process, will continue to follow  If discussed at Long Length of Stay Meetings, dates discussed:    Additional Comments:  Erenest Rasher, RN 11/21/2016, 2:13 PM

## 2016-11-21 NOTE — Progress Notes (Signed)
Sp02 while ambulating on room air 82%. Patient sat back down in bed and placed on 2L [place] and Spo2 93%.

## 2016-11-21 NOTE — Evaluation (Signed)
Physical Therapy Evaluation Patient Details Name: [name] MRN: [id number] DOB: 26-Sep-1931 Today's Date: 11/21/2016   History of Present Illness  Desiree Span Albrightis a 83 y.o.femalewith medical history significant for remote CABG 1, COPD GOLD II, chronic diastolic heart failure in setting of pulmonary hypertension, stage III chronic kidney disease, diabetes, dyslipidemia, hypertension who reports sore throat, congestion, shortness of breath and dyspnea on exertion.  Clinical Impression  Pt presents with decreased strength and activity tolerance secondary to above. Pt near baseline function. Pt tolerated ambulation on 2L O2 with sats >89%, but denies participation in stairs secondary to fatigue. Recommend d/c home with supervision for mobility when medically ready. Acute PT will follow.  Marland KitchenSATURATION QUALIFICATIONS: (This note is used to comply with regulatory documentation for home oxygen)   Patient Saturations on 2 Liters of oxygen while Ambulating = >89%      Follow Up Recommendations No PT follow up;Supervision for mobility/OOB    Equipment Recommendations  None recommended by PT    Recommendations for Other Services       Precautions / Restrictions Precautions Precautions: Fall Restrictions Weight Bearing Restrictions: No      Mobility  Bed Mobility               General bed mobility comments: seated EOB upon PT arrival  Transfers Overall transfer level: Modified independent Equipment used: None             General transfer comment: increased time, stand pivot to [facility] and stand to amb  Ambulation/Gait Ambulation/Gait assistance: Supervision Ambulation Distance (Feet): 200 Feet Assistive device: None Gait Pattern/deviations: Step-through pattern;Decreased step length - right;Decreased step length - left;Decreased stride length Gait velocity: decreased Gait velocity interpretation: Below normal speed for age/gender General Gait Details: supervision  for safety, sats >90% on 2L O2 during amb with no complaints of SOB  Stairs            Wheelchair Mobility    Modified Rankin (Stroke Patients Only)       Balance Overall balance assessment: Modified Independent                                           Pertinent Vitals/Pain Pain Assessment: No/denies pain    Home Living Family/patient expects to be discharged to:: Private residence Living Arrangements: Spouse/significant other Available Help at Discharge: Family;Available 24 hours/day Type of Home: House Home Access: Stairs to enter Entrance Stairs-Rails: Right Entrance Stairs-Number of Steps: 3 Home Layout: Two level (basement is on bottom level and pt never goes down there) Home Equipment: Grab bars - tub/shower      Prior Function Level of Independence: Independent               Hand Dominance   Dominant Hand: Right    Extremity/Trunk Assessment   Upper Extremity Assessment Upper Extremity Assessment: Overall WFL for tasks assessed    Lower Extremity Assessment Lower Extremity Assessment: Generalized weakness    Cervical / Trunk Assessment Cervical / Trunk Assessment: Normal  Communication   Communication: No difficulties  Cognition Arousal/Alertness: Awake/alert Behavior During Therapy: WFL for tasks assessed/performed Overall Cognitive Status: Within Functional Limits for tasks assessed                      General Comments General comments (skin integrity, edema, etc.): stand pivot to [facility], pt independent with toilet  and hand hygeine afterwards; pt able to perform all of DGI besides stairs (denied due to fatigue) without LOB and picked up rubber band from floor while standing    Exercises     Assessment/Plan    PT Assessment Patient needs continued PT services  PT Problem List Decreased strength;Decreased activity tolerance;Decreased mobility       PT Treatment Interventions Gait training;Stair  training;Therapeutic activities;Therapeutic exercise;Balance training    PT Goals (Current goals can be found in the Care Plan section)  Acute Rehab PT Goals Patient Stated Goal: to go home PT Goal Formulation: With patient Time For Goal Achievement: 12/04/2016 Potential to Achieve Goals: Good    Frequency Min 3X/week   Barriers to discharge        Co-evaluation               End of Session Equipment Utilized During Treatment: Gait belt;Oxygen (2L O2) Activity Tolerance: Patient tolerated treatment well Patient left: in bed;with call bell/phone within reach Nurse Communication: Mobility status PT Visit Diagnosis: Other abnormalities of gait and mobility (R26.89);Muscle weakness (generalized) (M62.81)         Time: 1470-9295 PT Time Calculation (min) (ACUTE ONLY): 22 min   Charges:   PT Evaluation $PT Eval Moderate Complexity: 1 Procedure     PT G Codes:         [name] 2016-12-04, 3:50 PM  Tracie Harrier, SPT Acute Rehab SPT [phone]

## 2016-11-22 LAB — BASIC METABOLIC PANEL
ANION GAP: 10 (ref 5–15)
BUN: 44 mg/dL — ABNORMAL HIGH (ref 6–20)
CALCIUM: 9.2 mg/dL (ref 8.9–10.3)
CO2: 26 mmol/L (ref 22–32)
Chloride: 101 mmol/L (ref 101–111)
Creatinine, Ser: 1.51 mg/dL — ABNORMAL HIGH (ref 0.44–1.00)
GFR, EST AFRICAN AMERICAN: 36 mL/min — AB (ref >60)
GFR, EST NON AFRICAN AMERICAN: 31 mL/min — AB (ref >60)
GLUCOSE: 215 mg/dL — AB (ref 65–99)
POTASSIUM: 3.8 mmol/L (ref 3.5–5.1)
Sodium: 137 mmol/L (ref 135–145)

## 2016-11-22 LAB — LEGIONELLA PNEUMOPHILA SEROGP 1 UR AG: L. pneumophila Serogp 1 Ur Ag: NEGATIVE

## 2016-11-22 LAB — GLUCOSE, CAPILLARY
GLUCOSE-CAPILLARY: 221 mg/dL — AB (ref 65–99)
GLUCOSE-CAPILLARY: 126 mg/dL — AB (ref 65–99)
Glucose-Capillary: 331 mg/dL — ABNORMAL HIGH (ref 65–99)

## 2016-11-22 LAB — CBC
HCT: 34.1 % — ABNORMAL LOW (ref 36.0–46.0)
HEMOGLOBIN: 10.5 g/dL — AB (ref 12.0–15.0)
MCH: 26.1 pg (ref 26.0–34.0)
MCHC: 30.8 g/dL (ref 30.0–36.0)
MCV: 84.6 fL (ref 78.0–100.0)
Platelets: 230 10*3/uL (ref 150–400)
RBC: 4.03 MIL/uL (ref 3.87–5.11)
RDW: 15.3 % (ref 11.5–15.5)
WBC: 9.2 10*3/uL (ref 4.0–10.5)

## 2016-11-22 MED ORDER — ORAL CARE MOUTH RINSE
15.0000 mL | Freq: Two times a day (BID) | OROMUCOSAL | Status: DC
  Administered 2016-11-22 – 2016-11-23 (×3): 15 mL via OROMUCOSAL

## 2016-11-22 MED ORDER — INSULIN ASPART 100 UNIT/ML ~~LOC~~ SOLN
5.0000 [IU] | Freq: Three times a day (TID) | SUBCUTANEOUS | Status: DC
  Administered 2016-11-23: 5 [IU] via SUBCUTANEOUS

## 2016-11-22 NOTE — Progress Notes (Signed)
Inpatient Diabetes Program Recommendations  AACE/ADA: New Consensus Statement on Inpatient Glycemic Control (2015)  Target Ranges:  Prepandial:   less than 140 mg/dL      Peak postprandial:   less than 180 mg/dL (1-2 hours)      Critically ill patients:  140 - 180 mg/dL   Lab Results  Component Value Date   GLUCAP 221 (H) 11/22/2016   HGBA1C 8.0 (H) 11/19/2016   Results for MEMORI, SAMMON (MRN [id number]) as of 11/22/2016 10:34  Ref. Range 11/21/2016 07:49 11/21/2016 12:02 11/21/2016 16:59 11/21/2016 21:56 11/22/2016 07:41  Glucose-Capillary Latest Ref Range: 65 - 99 mg/dL 204 (H) 209 (H) 300 (H) 142 (H) 221 (H)    Inpatient Diabetes Program Recommendations:   Insulin - Meal Coverage: Please consider Novolog 3 units tidac if patient eats >50% of meal as noted that postprandial CBG's remain elevated on steroids.  Thank you,  Windy Carina, RN, MSN Diabetes Coordinator Inpatient Diabetes Program [phone] (Team Pager)

## 2016-11-22 NOTE — Progress Notes (Signed)
PROGRESS NOTE    [name]  XFG:[id number] DOB: June 28, 1932 DOA: 11/19/2016 PCP: [name], MD   Outpatient Specialists:    Brief Narrative:  [name] is a 83 y.o. female with medical history significant for remote CABG 1, COPD GOLD II, chronic diastolic heart failure in setting of pulmonary hypertension, stage III chronic kidney disease, diabetes on oral medications, dyslipidemia, hypertension who reports 3-4 weeks of sore throat, congestion, shortness of breath and dyspnea on exertion. She has been empirically treated for COPD exacerbation of bronchitis. She has been evaluated by both her PCP (Feb 20) and the ER staff (Feb 10). She returns to the ER due to worsening of her symptoms. Prior x-rays have not demonstrated any evidence of pneumonia. She has not improved with the utilization of nebulizers and steroids.   Assessment & Plan:   Principal Problem:   Acute respiratory failure with hypoxia (HCC) Active Problems:   COPD GOLD II    Essential hypertension   S/P CABG x 1   COPD exacerbation (HCC)   CKD (chronic kidney disease), stage III   Diabetes mellitus type 2, uncontrolled (HCC)   CAP (community acquired pneumonia)   Acute on chronic diastolic heart failure (HCC)   Pulmonary HTN   HLD (hyperlipidemia)   Acute respiratory failure with hypoxia  -Multifactorial: COPD exacerbation vs infectious such as viral or bacterial pneumonia vs diastolic heart failure; likely a combination of all 3 -suspect patient will go home on O2--- this presentation is similar to previous exacerbations where she was temporarily on O2    COPD GOLD II w/ acute exacerbation -Duo nebs scheduled every 6 hrs -incentive spirometry as above -Solu-Medrol 60 mg IV every 12 hrs -Supportive care with oxygen    CAP (community acquired pneumonia) -Chest x-ray concerning for infectious process -change to PO abx -Influenza PCR negative -Urinary strep negative -Blood cultures NGTD -pro  calcitonin was elevated but improving    Acute on chronic diastolic heart failure /Pulmonary HTN -BNP mildly elevated at 436 -Preadmit was on Lasix 20 mg QOD - Lasix 60 mg IV 1 with improvement on x ray (increase home dose to '40mg'$  QOD and watch CR closely) -Echocardiogram completed in 2017: Preserved LV function with mild LVH, grade 1 diastolic dysfunction -Echocardiogram done this admission: similar -Continue preadmission ARB    CKD (chronic kidney disease), stage III -Renal function stable and at baseline -Follow labs closely with diuresis    Diabetes mellitus type 2, uncontrolled -Current CBGs have been consistently greater than 200 since the initiation of prednisone taper -Continue Amaryl -SSI    Essential hypertension -Current blood pressure moderately controlled -Continue ARB and Norvasc    S/P CABG x 1 -Currently without reports of chest pain -evaluated by cardiologist on 2/26    HLD (hyperlipidemia) -Continue Pravachol   DVT prophylaxis:  Lovenox   Code Status: Full Code  Family Communication: Patient  Disposition Plan:  Home with O2-- may be ready tommorrow   Consultants:    Subjective: Feeling better, dropped into the low 80s on RA with ambulation   Objective: Vitals:   11/22/16 0422 11/22/16 0546 11/22/16 0829 11/22/16 0834  BP:  (!) 140/58    Pulse:  76    Resp:  18    Temp:  98.4 F (36.9 C)    TempSrc:  Oral    SpO2:  96% 95% 100%  Weight: 48.2 kg (106 lb 4.8 oz)       Intake/Output Summary (Last 24 hours) at 11/22/16  [place] filed at 11/22/16 0612  Gross per 24 hour  Intake             1123 ml  Output                2 ml  Net             1121 ml   Filed Weights   11/19/16 1555 11/21/16 0251 11/22/16 0422  Weight: 47.7 kg (105 lb 1.6 oz) 49.2 kg (108 lb 8 oz) 48.2 kg (106 lb 4.8 oz)    Examination:  General exam: Appears calm and comfortable  Respiratory system: diminished breath sounds with decreased  wheezing Cardiovascular system: S1 & S2 heard, RRR. No JVD, murmurs, rubs, gallops or clicks. No pedal edema. Gastrointestinal system: Abdomen is nondistended, soft and nontender. No organomegaly or masses felt. Normal bowel sounds heard. Central nervous system: Alert and oriented. No focal neurological deficits.      Data Reviewed: I have personally reviewed following labs and imaging studies  CBC:  Recent Labs Lab 11/19/16 0955 11/22/16 0726  WBC 13.2* 9.2  HGB 11.3* 10.5*  HCT 37.5 34.1*  MCV 85.2 84.6  PLT 216 619   Basic Metabolic Panel:  Recent Labs Lab 11/19/16 0955 11/19/16 1630 11/20/16 0524 11/21/16 0505 11/22/16 0726  NA 139  --  138 137 137  K 3.6  --  4.1 3.9 3.8  CL 101  --  104 102 101  CO2 25  --  '27 26 26  '$ GLUCOSE 261*  --  207* 245* 215*  BUN 25*  --  35* 43* 44*  CREATININE 1.71*  --  1.65* 1.37* 1.51*  CALCIUM 8.9  --  8.7* 8.9 9.2  MG  --  2.2  --   --   --    GFR: Estimated Creatinine Clearance: 20.6 mL/min (by C-G formula based on SCr of 1.51 mg/dL (H)). Liver Function Tests: No results for input(s): AST, ALT, ALKPHOS, BILITOT, PROT, ALBUMIN in the last 168 hours. No results for input(s): LIPASE, AMYLASE in the last 168 hours. No results for input(s): AMMONIA in the last 168 hours. Coagulation Profile: No results for input(s): INR, PROTIME in the last 168 hours. Cardiac Enzymes: No results for input(s): CKTOTAL, CKMB, CKMBINDEX, TROPONINI in the last 168 hours. BNP (last 3 results) No results for input(s): PROBNP in the last 8760 hours. HbA1C:  Recent Labs  11/19/16 1509  HGBA1C 8.0*   CBG:  Recent Labs Lab 11/21/16 0749 11/21/16 1202 11/21/16 1659 11/21/16 2156 11/22/16 0741  GLUCAP 204* 209* 300* 142* 221*   Lipid Profile: No results for input(s): CHOL, HDL, LDLCALC, TRIG, CHOLHDL, LDLDIRECT in the last 72 hours. Thyroid Function Tests: No results for input(s): TSH, T4TOTAL, FREET4, T3FREE, THYROIDAB in the last 72  hours. Anemia Panel: No results for input(s): VITAMINB12, FOLATE, FERRITIN, TIBC, IRON, RETICCTPCT in the last 72 hours. Urine analysis:    Component Value Date/Time   COLORURINE YELLOW 10/27/2016 [place] 10/27/2016 1033   LABSPEC 1.012 10/27/2016 1033   PHURINE 5.0 10/27/2016 1033   GLUCOSEU NEGATIVE 10/27/2016 1033   HGBUR NEGATIVE 10/27/2016 [place] 10/27/2016 1033   KETONESUR NEGATIVE 10/27/2016 1033   PROTEINUR NEGATIVE 10/27/2016 1033   UROBILINOGEN 0.2 07/11/2013 1416   NITRITE NEGATIVE 10/27/2016 1033   LEUKOCYTESUR NEGATIVE 10/27/2016 1033     ) Recent Results (from the past 240 hour(s))  Culture, blood (routine x 2) Call MD if  unable to obtain prior to antibiotics being given     Status: None (Preliminary result)   Collection Time: 11/19/16  3:06 PM  Result Value Ref Range Status   Specimen Description BLOOD RIGHT ANTECUBITAL  Final   Special Requests BOTTLES DRAWN AEROBIC ONLY 5CC  Final   Culture NO GROWTH 2 DAYS  Final   Report Status PENDING  Incomplete  Culture, blood (routine x 2) Call MD if unable to obtain prior to antibiotics being given     Status: None (Preliminary result)   Collection Time: 11/19/16  3:17 PM  Result Value Ref Range Status   Specimen Description BLOOD RIGHT ANTECUBITAL  Final   Special Requests BOTTLES DRAWN AEROBIC ONLY 5CC  Final   Culture NO GROWTH 2 DAYS  Final   Report Status PENDING  Incomplete      Anti-infectives    Start     Dose/Rate Route Frequency Ordered Stop   11/21/16 1400  doxycycline (VIBRA-TABS) tablet 100 mg     100 mg Oral Every 12 hours 11/21/16 1322     11/20/16 1000  cefTRIAXone (ROCEPHIN) 1 g in dextrose 5 % 50 mL IVPB  Status:  Discontinued     1 g 100 mL/hr over 30 Minutes Intravenous Every 24 hours 11/19/16 1216 11/21/16 1322   11/20/16 1000  azithromycin (ZITHROMAX) 500 mg in dextrose 5 % 250 mL IVPB  Status:  Discontinued     500 mg 250 mL/hr over 60 Minutes  Intravenous Every 24 hours 11/19/16 1216 11/21/16 1322   11/19/16 1130  cefTRIAXone (ROCEPHIN) 1 g in dextrose 5 % 50 mL IVPB     1 g 100 mL/hr over 30 Minutes Intravenous  Once 11/19/16 1123 11/19/16 1256   11/19/16 1130  azithromycin (ZITHROMAX) tablet 500 mg     500 mg Oral  Once 11/19/16 1123 11/19/16 1227       Radiology Studies: Dg Chest 2 View  Result Date: 11/20/2016 CLINICAL DATA:  Follow-up pneumonia EXAM: CHEST  2 VIEW COMPARISON:  11/19/2016 FINDINGS: Cardiac shadow is stable. Postsurgical changes are again seen. The lungs are hyperinflated consistent with COPD. Chronic changes are noted in the bases with overall improved aeration when compare with the recent exam. No new focal infiltrate is seen. IMPRESSION: Improving aeration in the bases bilaterally. Electronically Signed   By: Inez Catalina M.D.   On: 11/20/2016 10:11        Scheduled Meds: . amLODipine  10 mg Oral Daily  . budesonide (PULMICORT) nebulizer solution  0.5 mg Nebulization BID  . doxycycline  100 mg Oral Q12H  . enoxaparin (LOVENOX) injection  30 mg Subcutaneous Q24H  . famotidine  20 mg Oral Daily  . furosemide  40 mg Oral QODAY  . glimepiride  1 mg Oral Q breakfast  . guaiFENesin  600 mg Oral BID  . insulin aspart  0-5 Units Subcutaneous QHS  . insulin aspart  0-9 Units Subcutaneous TID WC  . ipratropium-albuterol  3 mL Nebulization Q6H  . irbesartan  150 mg Oral Daily  . magnesium oxide  200 mg Oral Daily  . mouth rinse  15 mL Mouth Rinse BID  . methylPREDNISolone (SOLU-MEDROL) injection  60 mg Intravenous Q12H  . pravastatin  40 mg Oral QPM  . sertraline  100 mg Oral q morning - 10a  . sodium chloride flush  3 mL Intravenous Q12H   Continuous Infusions:   LOS: 2 days    Time spent: 25 min  [place], DO [facility] Pager [phone]  If 7PM-7AM, please contact night-coverage [email or web address] Password [facility] 11/22/2016, 8:53 AM

## 2016-11-22 NOTE — Progress Notes (Signed)
Physical Therapy Treatment Patient Details Name: [name] MRN: [id number] DOB: 10-Jun-1932 Today's Date: 11/22/2016    History of Present Illness [name] a 83 y.o.femalewith medical history significant for remote CABG 1, COPD GOLD II, chronic diastolic heart failure in setting of pulmonary hypertension, stage III chronic kidney disease, diabetes, dyslipidemia, hypertension who reports sore throat, congestion, shortness of breath and dyspnea on exertion.    PT Comments    Pt performed gait and performed stair training in prep for d/c home.  Pt remains impulsive and desaturates with activity.   On arrival patient on 3L and sats 89%, transferred to bathroom, and increased to 4L after stair training due to desaturation of 86%.  PTA then increased to 6L [place] and Patient maintains 90%-91%.  Informed RN.     Follow Up Recommendations  No PT follow up;Supervision for mobility/OOB     Equipment Recommendations  None recommended by PT    Recommendations for Other Services       Precautions / Restrictions Precautions Precautions: Fall Restrictions Weight Bearing Restrictions: No    Mobility  Bed Mobility Overal bed mobility: Modified Independent             General bed mobility comments: No assistance needed in and out of.    Transfers Overall transfer level: Needs assistance Equipment used: None Transfers: Sit to/from Stand Sit to Stand: Supervision         General transfer comment: Pt stands impulsively but no LOB noted and good technique observed.    Ambulation/Gait Ambulation/Gait assistance: Supervision;Min guard (required min guard x1 for LOB to the R with head turn.  ) Ambulation Distance (Feet): 220 Feet Assistive device: None   Gait velocity: decreased   General Gait Details: Remains to require supervision for safety.  SPO2 dropped to 89% on 3L, increased to 4L and after stair training decreased to 86%, required 6L [place] to improve to  90%.   Stairs Stairs: Yes   Stair Management: One rail Right Number of Stairs: 3 General stair comments: Supervision with cues for use of rail and pacing.    Wheelchair Mobility    Modified Rankin (Stroke Patients Only)       Balance Overall balance assessment: Needs assistance   Sitting balance-Leahy Scale: Good       Standing balance-Leahy Scale: Fair                      Cognition Arousal/Alertness: Awake/alert Behavior During Therapy: WFL for tasks assessed/performed Overall Cognitive Status: Within Functional Limits for tasks assessed                      Exercises      General Comments        Pertinent Vitals/Pain Pain Assessment: No/denies pain    Home Living                      Prior Function            PT Goals (current goals can now be found in the care plan section) Acute Rehab PT Goals Patient Stated Goal: to go home Potential to Achieve Goals: Good Progress towards PT goals: Progressing toward goals    Frequency    Min 3X/week      PT Plan Current plan remains appropriate    Co-evaluation             End of Session Equipment Utilized During Treatment:  Gait belt;Oxygen (3-6L O2 [place]) Activity Tolerance: Patient tolerated treatment well Patient left: in bed;with call bell/phone within reach;with bed alarm set Nurse Communication: Mobility status PT Visit Diagnosis: Other abnormalities of gait and mobility (R26.89);Muscle weakness (generalized) (M62.81)     Time: 9191-6606 PT Time Calculation (min) (ACUTE ONLY): 23 min  Charges:  $Gait Training: 8-22 mins $Therapeutic Activity: 8-22 mins                    G Codes:       Cristela Blue 27-Nov-2016, 4:16 PM Governor Rook[place], PTA pager [phone]

## 2016-11-23 DIAGNOSIS — E785 Hyperlipidemia, unspecified: Secondary | ICD-10-CM

## 2016-11-23 DIAGNOSIS — J449 Chronic obstructive pulmonary disease, unspecified: Secondary | ICD-10-CM

## 2016-11-23 LAB — BASIC METABOLIC PANEL
Anion gap: 10 (ref 5–15)
BUN: 43 mg/dL — AB (ref 6–20)
CHLORIDE: 103 mmol/L (ref 101–111)
CO2: 24 mmol/L (ref 22–32)
CREATININE: 1.34 mg/dL — AB (ref 0.44–1.00)
Calcium: 8.8 mg/dL — ABNORMAL LOW (ref 8.9–10.3)
GFR, EST AFRICAN AMERICAN: 42 mL/min — AB (ref >60)
GFR, EST NON AFRICAN AMERICAN: 36 mL/min — AB (ref >60)
Glucose, Bld: 216 mg/dL — ABNORMAL HIGH (ref 65–99)
Potassium: 4.2 mmol/L (ref 3.5–5.1)
SODIUM: 137 mmol/L (ref 135–145)

## 2016-11-23 LAB — GLUCOSE, CAPILLARY
GLUCOSE-CAPILLARY: 134 mg/dL — AB (ref 65–99)
GLUCOSE-CAPILLARY: 149 mg/dL — AB (ref 65–99)
GLUCOSE-CAPILLARY: 157 mg/dL — AB (ref 65–99)
GLUCOSE-CAPILLARY: 57 mg/dL — AB (ref 65–99)

## 2016-11-23 LAB — CBC
HCT: 33.5 % — ABNORMAL LOW (ref 36.0–46.0)
Hemoglobin: 10.2 g/dL — ABNORMAL LOW (ref 12.0–15.0)
MCH: 25.6 pg — ABNORMAL LOW (ref 26.0–34.0)
MCHC: 30.4 g/dL (ref 30.0–36.0)
MCV: 84 fL (ref 78.0–100.0)
PLATELETS: 220 10*3/uL (ref 150–400)
RBC: 3.99 MIL/uL (ref 3.87–5.11)
RDW: 14.9 % (ref 11.5–15.5)
WBC: 7.8 10*3/uL (ref 4.0–10.5)

## 2016-11-23 LAB — PROCALCITONIN: PROCALCITONIN: 0.25 ng/mL

## 2016-11-23 MED ORDER — PREDNISONE 20 MG PO TABS: 20.0000 mg | ORAL_TABLET | Freq: Every day | ORAL | 0 refills | Status: DC

## 2016-11-23 MED ORDER — FUROSEMIDE 40 MG PO TABS: 40.0000 mg | ORAL_TABLET | ORAL | 2 refills | Status: DC

## 2016-11-23 MED ORDER — SERTRALINE HCL 100 MG PO TABS: 100.0000 mg | ORAL_TABLET | Freq: Every morning | ORAL | 0 refills | Status: AC

## 2016-11-23 MED ORDER — IPRATROPIUM-ALBUTEROL 0.5-2.5 (3) MG/3ML IN SOLN: 3.0000 mL | RESPIRATORY_TRACT | 2 refills | Status: DC | PRN

## 2016-11-23 MED ORDER — DOXYCYCLINE HYCLATE 100 MG PO TABS: 100.0000 mg | ORAL_TABLET | Freq: Two times a day (BID) | ORAL | 0 refills | Status: AC

## 2016-11-23 MED ORDER — BENZONATATE 100 MG PO CAPS: 100.0000 mg | ORAL_CAPSULE | Freq: Three times a day (TID) | ORAL | 0 refills | Status: DC | PRN

## 2016-11-23 MED ORDER — ALBUTEROL SULFATE (2.5 MG/3ML) 0.083% IN NEBU: 2.5000 mg | INHALATION_SOLUTION | Freq: Four times a day (QID) | RESPIRATORY_TRACT | 0 refills | Status: AC | PRN

## 2016-11-23 MED ORDER — GUAIFENESIN ER 600 MG PO TB12: 600.0000 mg | ORAL_TABLET | Freq: Two times a day (BID) | ORAL | 0 refills | Status: AC

## 2016-11-23 NOTE — Progress Notes (Signed)
Discharge instructions given. Pt verbalized understanding and all questions were answered.  

## 2016-11-23 NOTE — Discharge Summary (Signed)
Physician Discharge Summary  [name] GLO:[id number] DOB: 05-Aug-1932 DOA: 11/19/2016  PCP: [name], MD  Admit date: 11/19/2016 Discharge date: 11/23/2016  Time spent: 65 minutes  Recommendations for Outpatient Follow-up:  1. Follow up with [name], MD in 1-2 weeks. On follow up patient will need BMET to follow up electrolytes and renal function.   Discharge Diagnoses:  Principal Problem:   Acute respiratory failure with hypoxia ([place]) Active Problems:   COPD GOLD II    Essential hypertension   S/P CABG x 1   COPD exacerbation (HCC)   CKD (chronic kidney disease), stage III   Diabetes mellitus type 2, uncontrolled (HCC)   CAP (community acquired pneumonia)   Acute on chronic diastolic heart failure (HCC)   Pulmonary HTN   HLD (hyperlipidemia)   Discharge Condition: Stable and improved.  Diet recommendation: Heart healthy.  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 She had been empirically treated for COPD exacerbation of bronchitis. She had been evaluated by both her PCP (Feb 20) and the ER staff (Feb 10). She returned to the ER due to worsening of her symptoms. Prior x-rays have not demonstrated any evidence of pneumonia. She had not improved with the utilization of nebulizers and steroids.  ED Course:  Vital Signs: BP 117/66   Pulse 75   Temp 97.7 F (36.5 C) (Oral)   Resp 22   SpO2 91%  2 view CXR: Acute on chronic changes bilateral lung bases concerning for  infectious infiltrates Lab data: Sodium 139, potassium 3.6, chloride 101, CO2 25, glucose 261, BUN 25, creatinine 1.71, anion gap 13, BNP 436, poc troponin 0.03, white count 13,200 range not obtained, hemoglobin limb 0.1, platelets 216,000 Medications and treatments: DuoNeb 1, Solu Medrol 125 mg IV 1, Rocephin IV 1 g 1, Zithromax 500 mg IV 1, albuterol neb 2.5 mg 1   Hospital Course:  Acute respiratory failure with hypoxia  -Multifactorial: COPD exacerbation vs infectious such as viral or bacterial pneumonia vs diastolic heart failure;likely a combination of all 3 -Patient was placed on IV steroids, IV antibiotics, oxygen, scheduled nebulizer treatments and IV diuretics. Patient was subsequently transitioned to oral diuretics and dose increased. IV antibiotics were transitioned to oral antibiotics. Patient improved clinically. Patient be discharged home in stable and improved condition on a prednisone taper, oral antibiotics and breathing treatments. Outpatient follow-up.   COPD GOLD II w/acute exacerbation -Patient was placed on IV steroids, antibiotics, oxygen, scheduled nebulizer treatments, supportive O2. Patient improved slowly during the hospitalization. Patient was subsequently transitioned from IV Solu-Medrol to oral prednisone. Patient be discharged with a tapered dose of oral prednisone. Outpatient follow-up.   CAP (community acquired pneumonia) -Chest x-ray concerning for infectious process -Patient was placed on IV antibiotics initially during the hospitalization of Rocephin and azithromycin and subsequently transitioned to oral doxycycline.  -Influenza PCR negative -Urinary strep negative -Blood cultures NGTD -pro calcitonin was elevated but improved -Patient was discharged on oral doxycycline to complete a course of antibiotic treatment.  Acute on chronic diastolic heart failure /Pulmonary HTN -BNP mildly elevated at 436 -Preadmit was on Lasix 20 mg QOD - Lasix  60 mg IV 1 with improvement on x ray (increased home dose to '40mg'$  QOD and watched CR closely) -Echocardiogram completed in 2017: Preserved LV function with mild LVH, grade 1 diastolic dysfunction -Echocardiogram done this admission: similar -Continued on preadmission ARB  CKD (chronic kidney disease), stage III -Renal function remained stable stable and at baseline.  Diabetes mellitus type 2, uncontrolled -Patient was maintained on home regimen of Amaryl and SSI during the hospitalization.   Essential hypertension -Blood pressure moderately controlled -Patient was maintained on on ARB and Norvasc  S/P CABG x 1 -Denied any chest pain -evaluated by cardiologist on 2/26  HLD (hyperlipidemia) -Continued on home regimen of Pravachol   Procedures:  Chest x-ray 11/19/2016, 11/20/2016  2-D echo 11/20/2016  Consultations:  None  Discharge Exam: Vitals:   11/23/16 0833 11/23/16 1401  BP: (!) 143/75 136/65  Pulse: 69 80  Resp: 18 17  Temp:  98.3 F (36.8 C)    General: NAD Cardiovascular: RRR Respiratory: CTAB  Discharge Instructions   Discharge Instructions    Diet - low sodium heart healthy    Complete by:  As directed    Increase activity slowly    Complete by:  As directed      Current Discharge Medication List    START taking these medications   Details  benzonatate (TESSALON) 100 MG capsule Take 1 capsule (100 mg total) by mouth 3 (three) times daily as needed for cough. Qty: 20 capsule, Refills: 0    doxycycline (VIBRA-TABS) 100 MG tablet Take 1 tablet (100 mg total) by mouth every 12 (twelve) hours. Take for 4 days then stop. Qty: 8 tablet, Refills: 0    guaiFENesin (MUCINEX) 600 MG 12 hr tablet Take 1 tablet (600 mg total) by mouth 2 (two) times daily. Take for 4 days then stop. Qty: 8 tablet, Refills: 0    ipratropium-albuterol (DUONEB) 0.5-2.5 (3) MG/3ML SOLN Take 3 mLs by nebulization every 2 (two) hours as needed. Use 3 times  daily x 4 days then every 2 hours as needed for wheezing/SOB Qty: 360 mL, Refills: 2    predniSONE (DELTASONE) 20 MG tablet Take 1-3 tablets (20-60 mg total) by mouth daily with breakfast. Take 3 tablets ('60mg'$ ) daily x 3 days, then 2 tablets ('40mg'$ ) daily x 3 days, then 1 tablet ('20mg'$ ) daily x 3 days then stop. Qty: 18 tablet, Refills: 0      CONTINUE these medications which have CHANGED   Details  albuterol (PROVENTIL) (2.5 MG/3ML) 0.083% nebulizer solution Take 3 mLs (2.5 mg total) by nebulization every 6 (six) hours as needed for wheezing. Qty: 75 mL, Refills: 0    furosemide (LASIX) 40 MG tablet Take 1 tablet (40 mg total) by mouth every other day. Qty: 30 tablet, Refills: 2    sertraline (ZOLOFT) 100 MG tablet Take 1 tablet (100 mg total) by mouth every morning. Qty: 30 tablet, Refills: 0      CONTINUE these medications which have NOT CHANGED   Details  acetaminophen (TYLENOL) 500 MG tablet Take 500 mg by mouth every 6 (six) hours as needed for headache.    !! albuterol (PROVENTIL HFA;VENTOLIN HFA) 108 (90 Base) MCG/ACT inhaler Inhale 2 puffs into the lungs every 6 (six) hours as needed for wheezing or shortness of breath.    amLODipine (NORVASC) 10 MG tablet Take 1 tablet (10 mg  total) by mouth daily. Qty: 90 tablet, Refills: 3   Associated Diagnoses: Aortic stenosis; Essential hypertension    aspirin EC 81 MG tablet Take 81 mg by mouth at bedtime.    cholecalciferol (VITAMIN D) 1000 units tablet Take 1,000 Units by mouth daily.    cyanocobalamin 500 MCG tablet Take 500 mcg by mouth at bedtime.    famotidine (PEPCID) 20 MG tablet Take 1 tablet (20 mg total) by mouth daily. Qty: 30 tablet, Refills: 0    fluticasone (FLOVENT HFA) 220 MCG/ACT inhaler Inhale 2 puffs into the lungs 2 (two) times daily.    glimepiride (AMARYL) 4 MG tablet Take 4 mg by mouth daily with breakfast.     ibandronate (BONIVA) 150 MG tablet Take 150 mg by mouth every 30 (thirty) days. Take in the  morning with a full glass of water, on an empty stomach, and do not take anything else by mouth or lie down for the next 30 min.    irbesartan (AVAPRO) 150 MG tablet Take 1 tablet (150 mg total) by mouth daily. Qty: 90 tablet, Refills: 3   Associated Diagnoses: Aortic stenosis; Essential hypertension    Magnesium 250 MG TABS Take 250 mg by mouth at bedtime.     Multiple Vitamins-Minerals (OCUVITE-LUTEIN PO) Take 1 tablet by mouth every morning.     nystatin (MYCOSTATIN) 100000 UNIT/ML suspension Take 5 mLs by mouth daily.    pravastatin (PRAVACHOL) 40 MG tablet Take 40 mg by mouth every evening.     !! PROAIR HFA 108 (90 BASE) MCG/ACT inhaler Inhale 2 puffs into the lungs 4 (four) times daily as needed for wheezing or shortness of breath.      !! - Potential duplicate medications found. Please discuss with provider.     Allergies  Allergen Reactions  . Amaryl [Glimepiride]     Side effects  . Avelox [Moxifloxacin Hcl In Nacl]     nausea  . Ciprofloxacin     nausea  . Lipitor [Atorvastatin] Other (See Comments)    myalgias myalgias  . Lisinopril Other (See Comments)    cough cough  . Other Other (See Comments)    Increased cough  . Prevacid [Lansoprazole]     diarrhea  . Spiriva Handihaler [Tiotropium Bromide Monohydrate]     Increased cough  . Symbicort [Budesonide-Formoterol Fumarate]     Increased cough   Follow-up [facility] up.   Why:  Home oxygen arranged Contact information: [address] [place] [address] [phone]        [name], MD. Schedule an appointment as soon as possible for a visit in 1 week(s).   Specialty:  Family Medicine Why:  f/u in 1-2 weeks. Contact information: [address] [place] [address] [phone]            The results of significant diagnostics from this hospitalization (including imaging, microbiology, ancillary and laboratory) are  listed below for reference.    Significant Diagnostic Studies: Dg Chest 2 View  Result Date: 11/20/2016 CLINICAL DATA:  Follow-up pneumonia EXAM: CHEST  2 VIEW COMPARISON:  11/19/2016 FINDINGS: Cardiac shadow is stable. Postsurgical changes are again seen. The lungs are hyperinflated consistent with COPD. Chronic changes are noted in the bases with overall improved aeration when compare with the recent exam. No new focal infiltrate is seen. IMPRESSION: Improving aeration in the bases bilaterally. Electronically Signed   By: Linus Mako.D.  On: 11/20/2016 10:11   Dg Chest 2 View  Result Date: 11/19/2016 CLINICAL DATA:  Cough, shortness of breath, weakness, bronchitis x 3-4 weeks. Some nausea experienced, but no vomiting. Hx COPD, diabetes, HTN, previous smoker EXAM: CHEST  2 VIEW COMPARISON:  10/27/2016 FINDINGS: Status post median sternotomy and valve replacement. The heart is normal in size. There are chronic changes at the bases. However increased opacity is identified at both lung bases, raising the question of superimposed acute infiltrates. Emphysematous changes are identified at the apices. IMPRESSION: Suspect acute on chronic changes at the lung bases consistent with infectious infiltrates. Electronically Signed   By: Nolon Nations M.D.   On: 11/19/2016 10:52   Dg Chest 2 View  Result Date: 10/27/2016 CLINICAL DATA:  One week history of cough. EXAM: CHEST  2 VIEW COMPARISON:  Chest x-rays and chest CT 2017 FINDINGS: The heart is upper limits of normal in size. Stable tortuosity and calcification of the thoracic aorta. Surgical changes from aortic valve replacement surgery. Stable emphysematous changes and basilar pulmonary scarring. No definite acute superimposed pneumonia, pleural effusion or pulmonary edema. Stable moderate-sized hiatal hernia. The bony thorax is intact. Stable degenerative changes involving the spine. IMPRESSION: Chronic emphysematous changes and pulmonary scarring at the  bases. No definite acute overlying pulmonary process. Electronically Signed   By: Marijo Sanes M.D.   On: 10/27/2016 10:12    Microbiology: Recent Results (from the past 240 hour(s))  Culture, blood (routine x 2) Call MD if unable to obtain prior to antibiotics being given     Status: None (Preliminary result)   Collection Time: 11/19/16  3:06 PM  Result Value Ref Range Status   Specimen Description BLOOD RIGHT ANTECUBITAL  Final   Special Requests BOTTLES DRAWN AEROBIC ONLY 5CC  Final   Culture NO GROWTH 3 DAYS  Final   Report Status PENDING  Incomplete  Culture, blood (routine x 2) Call MD if unable to obtain prior to antibiotics being given     Status: None (Preliminary result)   Collection Time: 11/19/16  3:17 PM  Result Value Ref Range Status   Specimen Description BLOOD RIGHT ANTECUBITAL  Final   Special Requests BOTTLES DRAWN AEROBIC ONLY 5CC  Final   Culture NO GROWTH 3 DAYS  Final   Report Status PENDING  Incomplete     Labs: Basic Metabolic Panel:  Recent Labs Lab 11/19/16 0955 11/19/16 1630 11/20/16 0524 11/21/16 0505 11/22/16 0726 11/23/16 0514  NA 139  --  138 137 137 137  K 3.6  --  4.1 3.9 3.8 4.2  CL 101  --  104 102 101 103  CO2 25  --  '27 26 26 24  '$ GLUCOSE 261*  --  207* 245* 215* 216*  BUN 25*  --  35* 43* 44* 43*  CREATININE 1.71*  --  1.65* 1.37* 1.51* 1.34*  CALCIUM 8.9  --  8.7* 8.9 9.2 8.8*  MG  --  2.2  --   --   --   --    Liver Function Tests: No results for input(s): AST, ALT, ALKPHOS, BILITOT, PROT, ALBUMIN in the last 168 hours. No results for input(s): LIPASE, AMYLASE in the last 168 hours. No results for input(s): AMMONIA in the last 168 hours. CBC:  Recent Labs Lab 11/19/16 0955 11/22/16 0726 11/23/16 0514  WBC 13.2* 9.2 7.8  HGB 11.3* 10.5* 10.2*  HCT 37.5 34.1* 33.5*  MCV 85.2 84.6 84.0  PLT 216 230 220   Cardiac Enzymes:  No results for input(s): CKTOTAL, CKMB, CKMBINDEX, TROPONINI in the last 168 hours. BNP: BNP (last  3 results)  Recent Labs  11/19/16 0955  BNP 436.4*    ProBNP (last 3 results) No results for input(s): PROBNP in the last 8760 hours.  CBG:  Recent Labs Lab 11/22/16 1756 11/23/16 0004 11/23/16 0824 11/23/16 1150 11/23/16 1234  GLUCAP 331* 149* 134* 57* 157*       Signed:  THOMPSON,DANIEL MD.  [facility] 11/23/2016, 2:14 PM

## 2016-11-23 NOTE — Progress Notes (Signed)
SATURATION QUALIFICATIONS: (This note is used to comply with regulatory documentation for home oxygen)  Patient Saturations on Room Air at Rest = 93%  Patient Saturations on Room Air while Ambulating = 85%  Patient Saturations on 3 Liters of oxygen while Ambulating = 94%  Please briefly explain why patient needs home oxygen: patient's oxygen saturations decreased below 88% on room air while ambulating.

## 2016-11-23 NOTE — Progress Notes (Signed)
PT Cancellation Note  Patient Details Name: [name] MRN: [id number] DOB: 1932/07/01   Cancelled Treatment:    Reason Eval/Treat Not Completed: Other (comment) (Pt awaiting d/c and reports she has no further needs in regards to her mobility.  Walking O2 assessment performed this am.  )   [name] Eli Hose 11/23/2016, 2:32 PM Governor Rooks, PTA pager [phone]

## 2016-11-24 ENCOUNTER — Ambulatory Visit: Payer: Medicare Other | Admitting: Podiatry

## 2016-11-24 LAB — CULTURE, BLOOD (ROUTINE X 2)
CULTURE: NO GROWTH
CULTURE: NO GROWTH

## 2016-11-30 DIAGNOSIS — J449 Chronic obstructive pulmonary disease, unspecified: Secondary | ICD-10-CM

## 2016-12-07 ENCOUNTER — Ambulatory Visit (INDEPENDENT_AMBULATORY_CARE_PROVIDER_SITE_OTHER)
Admission: RE | Admit: 2016-12-07 | Discharge: 2016-12-07 | Disposition: A | Discharge status: Home / Self Care | Payer: Medicare Other | Source: Ambulatory Visit | Attending: Internal Medicine | Admitting: Internal Medicine

## 2016-12-07 ENCOUNTER — Encounter: Payer: Self-pay | Admitting: Internal Medicine

## 2016-12-07 ENCOUNTER — Ambulatory Visit (INDEPENDENT_AMBULATORY_CARE_PROVIDER_SITE_OTHER): Payer: Medicare Other | Admitting: Internal Medicine

## 2016-12-07 ENCOUNTER — Other Ambulatory Visit (INDEPENDENT_AMBULATORY_CARE_PROVIDER_SITE_OTHER): Payer: Medicare Other

## 2016-12-07 DIAGNOSIS — J9621 Acute and chronic respiratory failure with hypoxia: Secondary | ICD-10-CM

## 2016-12-07 DIAGNOSIS — D638 Anemia in other chronic diseases classified elsewhere: Secondary | ICD-10-CM | POA: Diagnosis not present

## 2016-12-07 DIAGNOSIS — N183 Chronic kidney disease, stage 3 unspecified: Secondary | ICD-10-CM

## 2016-12-07 DIAGNOSIS — R5381 Other malaise: Secondary | ICD-10-CM | POA: Diagnosis not present

## 2016-12-07 LAB — BASIC METABOLIC PANEL
BUN: 32 mg/dL — AB (ref 6–23)
CHLORIDE: 102 meq/L (ref 96–112)
CO2: 28 mEq/L (ref 19–32)
CREATININE: 1.23 mg/dL — AB (ref 0.40–1.20)
Calcium: 9 mg/dL (ref 8.4–10.5)
GFR: 44.4 mL/min — ABNORMAL LOW (ref >60.00)
GLUCOSE: 329 mg/dL — AB (ref 70–99)
Potassium: 3.6 mEq/L (ref 3.5–5.1)
Sodium: 139 mEq/L (ref 135–145)

## 2016-12-07 LAB — CBC
HEMATOCRIT: 35.4 % — AB (ref 36.0–46.0)
Hemoglobin: 11.3 g/dL — ABNORMAL LOW (ref 12.0–15.0)
MCHC: 31.9 g/dL (ref 30.0–36.0)
MCV: 80.6 fl (ref 78.0–100.0)
Platelets: 215 10*3/uL (ref 150.0–400.0)
RBC: 4.39 Mil/uL (ref 3.87–5.11)
RDW: 15.8 % — AB (ref 11.5–15.5)
WBC: 9.5 10*3/uL (ref 4.0–10.5)

## 2016-12-07 NOTE — Assessment & Plan Note (Signed)
Improved from pneumonia in early march 2018 but still hypoxemic  Plan - contnue o2 for now - hopeful you can come off next few weeks to few months - continue duoneb scheduled, lasix and flovent scheduled  - do cxr 12/07/2016

## 2016-12-07 NOTE — Patient Instructions (Signed)
CKD (chronic kidney disease), stage III Check bmet  Anemia, chronic disease Check cbc  Acute-on-chronic respiratory failure ([place]) Improved from pneumonia in early march 2018 but still hypoxemic  Plan - contnue o2 for now - hopeful you can come off next few weeks to few months - continue duoneb scheduled, lasix and flovent scheduled  - do cxr 12/07/2016   Physical deconditioning This is a major issue for you post pneumonia  Plan Refer home PT   Fopllowup With app in 3 weeks or sooner if needed

## 2016-12-07 NOTE — Assessment & Plan Note (Signed)
Check bmet

## 2016-12-07 NOTE — Progress Notes (Signed)
Subjective:     Patient ID: [name], female   DOB: [birth year], 83 y.o.   MRN: [id number]  HPI   OV 05/03/2015  COPD 2 with setting of moderate hiatal hernia    This is a routine follow-up. She is doing well. Most recently her inhaler got switched to [facility] because of cough. The powdered form of prior inhaler was removed by Dr. Melvyn Novas. After this she is doing well. Cough is improved. COPD stable. She tried to get into pulmonary rehabilitation but deferred due to cost of $80 per session. There are no new issues. COPD CAT score 14 and improved as below compared to aporil 2016.   OV 11/01/2015  Chief Complaint  Patient presents with  . Follow-up    [name], was then changed to [facility] and could not tolerate the powder, then was changed back to flovent is tolerating this well. Pt denies significant cough. Pt denies CP/tightness.      Follow-up Gold stage II COPD [May 2016 DLCO 6.1/34%, postbronchodilator FEV1 1.2 L/81% without broncho-dilator change] in the setting of moderate hiatal hernia. Alpha 1-MM 2013 Clear CT chest May 2016. Ex-smoker  83 year old female with emphysema/Gold stage II COPD. She has a history of intolerance to powdered inhalers. Most recently tried Brio and did not tolerate it. Before that it was [facility]. Her primary care physician WHITE,[name], MD switched her r to Flovent which she is taking 2 puff 2 times daily and she is feeling well. No interim COPD exacerbations or ear admissions. No new medical problems. She is feeling well. She is quite functional. In the past she has been intolerant to Spiriva powdered inhaler but she is willing to try the respimat     OV 05/01/2016   [name], MD   Chief Complaint  Patient presents with  . Follow-up    Pt states her SOB is at baseline. Pt states she has been needing to clear her throat more than normal d/t increase in mucus. Pt denies CP/tightness and f/c/s.    FU Gold stage 2 copd"  with hiatal hernia and irritable larynx made worse bu powerederd MDI  Last seen August 2016. In the interim March 2017 she had admission for pneumonia in the hospital. I review the chart and confirmed the same. Follow-up chest x-ray showed partial improvement but she's not had a follow-up chest x-ray since then. [name] Currently doing well. Irritable larynx is at baseline and is mild. She is wondering about the role of hiatal hernia in her irritable larynx. Med review shows she is on Fosamax. She's not fully up-to-date with her vaccines. She is in need of Pneumovax but she declined it today.  . Immunization History  Administered Date(s) Administered  . Influenza Split 07/19/2014  . Influenza,inj,Quad PF,36+ Mos 06/19/2015  . Pneumococcal Conjugate-13 09/18/2013  . Zoster 08/18/2014   OV 08/07/2016  Chief Complaint  Patient presents with  . Follow-up    Pt states her SOB is unchanged - at baseline. Pt denies cough, CP/tightness, and f/c/s.      Follow-up Gold stage II COPD  Visit 3 month follow-up. Overall she's stable. Only complaint is chronic stable exertional dyspnea for a flight of stairs which is relieved by albuterol. She does not take albuterol before such exertion. She does not like dry  powdered inhalers although she is tolerating Flovent. She is agreeable to having another sample inhaler that might work better for her. She is up-to-date with her flu shot and pneumonia vaccine. Her blood pressure medications being changed recently and she's having some side effects related to that. She will talk to primary care physician about it   OV 12/07/2016  Chief Complaint  Patient presents with  . Follow-up    Pt states she is hear after her recent hospitalization. Pt states she does not feel back to baseline with her SOB. Pt c/o  cough with little mucus production - white in color. Pt denies CP/tightness.     83 year old female with Gold stage II COPD. She normally was not on oxygen. Early March 2018 begin watchful 2018 and 11/24/2006 and she was hospitalized for pneumonia not otherwise specified. Personal visualization of the chest x-ray shows basilar infiltrates. She also was diagnosed with acute on chronic diastolic heart failure and mild acute on chronic kidney insufficiency. Labs are documented below. She was discharged on oxygen. Today she reports for follow-up. She's having significant fatigue that is worse than baseline and as documented on the COPD cat score. This is her main pulmonary problem. This no fever. Her shortness of breath is worse than baseline but is no chest pain. She did have an echocardiogram during the hospital that was normal and the troponin was normal. She f feels overwhelmed by fatigue.  CAT COPD Symptom & Quality of Life Score (GSK trademark) 0 is no burden. 5 is highest burden 01/01/2015  05/03/2015  12/07/2016   Never Cough -> Cough all the time '2 1 3  '$ No phlegm in chest -> Chest is full of phlegm '3 1 4  '$ No chest tightness -> Chest feels very tight '4 1 2  '$ No dyspnea for 1 flight stairs/hill -> Very dyspneic for 1 flight of stairs '5 4 5  '$ No limitations for ADL at home -> Very limited with ADL at home '3 2 3  '$ Confident leaving home -> Not at all confident leaving home '3 2 3  '$ Sleep soundly -> Do not sleep soundly because of lung condition '2 2 3  '$ Lots of Energy -> No energy at all '2 1 5  '$ TOTAL Score (max 40)  '24 14 28       '$ Results for LARENE, ASCENCIO (MRN [id number]) as of 12/07/2016 10:05  Ref. Range 11/19/2016 11:49  Troponin i, poc Latest Ref Range: 0.00 - 0.08 ng/mL 0.03      Results for ALEXY, BRINGLE (MRN [id number]) as of 12/07/2016 10:05  Ref. Range 11/19/2016 09:55 11/19/2016 11:49 11/19/2016 15:09 11/19/2016 16:30 11/20/2016 05:24 11/21/2016 05:05 11/22/2016 07:26 11/23/2016 05:14  Hemoglobin  Latest Ref Range: 12.0 - 15.0 g/dL 11.3 (L)      10.5 (L) 10.2 (L)   Results for KEINA, MUTCH (MRN [id number]) as of 12/07/2016 10:05  Ref. Range 11/19/2016 09:55 11/19/2016 11:49 11/19/2016 15:09 11/19/2016 16:30 11/20/2016 05:24 11/21/2016 05:05 11/22/2016 07:26 11/23/2016 05:14  Creatinine Latest Ref Range: 0.44 - 1.00 mg/dL 1.71 (H)    1.65 (H) 1.37 (H) 1.51 (H) 1.34 (H)      [name]; Aortic stenosis; Arthritis; Asthma; Depression; Diabetes mellitus; Emphysema of lung ([place]); GERD (gastroesophageal reflux disease); Glaucoma; Hyperlipidemia; Hypertension; [name]; Murmur; and Nephrolithiasis.   reports that she quit smoking about 44 years ago. Her smoking use included Cigarettes. She has a 30.00 pack-year smoking history. She has never used  smokeless tobacco.  Past Surgical History:  Procedure Laterality Date  . ABDOMINAL HYSTERECTOMY    . ANKLE SURGERY  1998   d/t fx.  Left ankle  . ANTERIOR AND POSTERIOR REPAIR  06/21/2011   Procedure: ANTERIOR (CYSTOCELE) AND POSTERIOR REPAIR (RECTOCELE);  Surgeon: Maisie Fus;  Location: [facility];  Service: Gynecology;  Laterality: N/A;  . AORTIC VALVE REPLACEMENT N/A 07/17/2013   Procedure: AORTIC VALVE REPLACEMENT (AVR);  Surgeon: Ivin Poot, MD;  Location: [place];  Service: Open Heart Surgery;  Laterality: N/A;  . CARDIAC CATHETERIZATION    . CARDIAC VALVE REPLACEMENT    . CORONARY ANGIOPLASTY    . CORONARY ARTERY BYPASS GRAFT N/A 07/17/2013   Procedure: CORONARY ARTERY BYPASS GRAFT, TIMES ONE, ON PUMP, USING RIGHT INTERNAL MAMMARY ARTERY.;  Surgeon: Ivin Poot, MD;  Location: [place];  Service: Open Heart Surgery;  Laterality: N/A;  . FACIAL COSMETIC SURGERY     face lift  . INTRAOPERATIVE TRANSESOPHAGEAL ECHOCARDIOGRAM N/A 07/17/2013   Procedure: INTRAOPERATIVE TRANSESOPHAGEAL ECHOCARDIOGRAM;  Surgeon: Ivin Poot, MD;  Location: [place];  Service: Open Heart Surgery;  Laterality: N/A;  .  LAPAROSCOPIC ASSISTED VAGINAL HYSTERECTOMY  06/21/2011   Procedure: LAPAROSCOPIC ASSISTED VAGINAL HYSTERECTOMY;  Surgeon: Maisie Fus;  Location: [facility];  Service: Gynecology;  Laterality: N/A;  . LEFT AND RIGHT HEART CATHETERIZATION WITH CORONARY ANGIOGRAM N/A 06/27/2013   Procedure: LEFT AND RIGHT HEART CATHETERIZATION WITH CORONARY ANGIOGRAM;  Surgeon: Ramond Dial, MD;  Location: [facility] C[facility];  Service: Cardiovascular;  Laterality: N/A;  . NOSE SURGERY    . SALPINGOOPHORECTOMY  06/21/2011   Procedure: SALPINGO OOPHERECTOMY;  Surgeon: Maisie Fus;  Location: [facility];  Service: Gynecology;  Laterality: Bilateral;  . VAGINAL PROLAPSE REPAIR  06/21/2011   Procedure: SACROPEXY;  Surgeon: Maisie Fus;  Location: [facility];  Service: Gynecology;  Laterality: N/A;  sacrospinous ligament suspension    Allergies  Allergen Reactions  . Amaryl [Glimepiride]     Side effects  . Avelox [Moxifloxacin Hcl In Nacl]     nausea  . Ciprofloxacin     nausea  . Lipitor [Atorvastatin] Other (See Comments)    myalgias myalgias  . Lisinopril Other (See Comments)    cough cough  . Other Other (See Comments)    Increased cough  . Prevacid [Lansoprazole]     diarrhea  . Spiriva Handihaler [Tiotropium Bromide Monohydrate]     Increased cough  . Symbicort [Budesonide-Formoterol Fumarate]     Increased cough    Immunization History  Administered Date(s) Administered  . Influenza Split 07/19/2014  . Influenza, High Dose Seasonal PF 06/16/2016  . Influenza,inj,Quad PF,36+ Mos 06/19/2015  . Pneumococcal Conjugate-13 09/18/2013  . Pneumococcal Polysaccharide-23 05/14/2006  . Tdap 03/04/2013  . Zoster 08/20/2008, 08/18/2014    Family History  Problem Relation Age of Onset  . Emphysema Father   . Colon cancer Neg Hx   . Esophageal cancer Neg Hx   . Rectal cancer Neg Hx   . Stomach cancer Neg Hx      Current Outpatient Prescriptions:  .  acetaminophen (TYLENOL) 500 MG tablet, Take 500 mg  by mouth every 6 (six) hours as needed for headache., Disp: , Rfl:  .  albuterol (PROVENTIL HFA;VENTOLIN HFA) 108 (90 Base) MCG/ACT inhaler, Inhale 2 puffs into the lungs every 6 (six) hours as needed for wheezing or shortness of breath., Disp: , Rfl:  .  albuterol (PROVENTIL) (2.5 MG/3ML) 0.083% nebulizer solution, Take  3 mLs (2.5 mg total) by nebulization every 6 (six) hours as needed for wheezing., Disp: 75 mL, Rfl: 0 .  amLODipine (NORVASC) 10 MG tablet, Take 1 tablet (10 mg total) by mouth daily., Disp: 90 tablet, Rfl: 3 .  aspirin EC 81 MG tablet, Take 81 mg by mouth at bedtime., Disp: , Rfl:  .  benzonatate (TESSALON) 100 MG capsule, Take 1 capsule (100 mg total) by mouth 3 (three) times daily as needed for cough., Disp: 20 capsule, Rfl: 0 .  cholecalciferol (VITAMIN D) 1000 units tablet, Take 1,000 Units by mouth daily., Disp: , Rfl:  .  cyanocobalamin 500 MCG tablet, Take 500 mcg by mouth at bedtime., Disp: , Rfl:  .  fluticasone (FLOVENT HFA) 220 MCG/ACT inhaler, Inhale 2 puffs into the lungs 2 (two) times daily., Disp: , Rfl:  .  furosemide (LASIX) 40 MG tablet, Take 1 tablet (40 mg total) by mouth every other day. (Patient taking differently: Take 40 mg by mouth daily. ), Disp: 30 tablet, Rfl: 2 .  glimepiride (AMARYL) 4 MG tablet, Take 4 mg by mouth daily with breakfast. , Disp: , Rfl:  .  ibandronate (BONIVA) 150 MG tablet, Take 150 mg by mouth every 30 (thirty) days. Take in the morning with a full glass of water, on an empty stomach, and do not take anything else by mouth or lie down for the next 30 min., Disp: , Rfl:  .  irbesartan (AVAPRO) 150 MG tablet, Take 1 tablet (150 mg total) by mouth daily., Disp: 90 tablet, Rfl: 3 .  Magnesium 250 MG TABS, Take 250 mg by mouth at bedtime. , Disp: , Rfl:  .  Multiple Vitamins-Minerals (OCUVITE-LUTEIN PO), Take 1 tablet by mouth every morning. , Disp: , Rfl:  .  nystatin (MYCOSTATIN) 100000 UNIT/ML suspension, Take 5 mLs by mouth daily., Disp:  , Rfl:  .  pantoprazole (PROTONIX) 40 MG tablet, Take 40 mg by mouth daily., Disp: , Rfl:  .  pravastatin (PRAVACHOL) 40 MG tablet, Take 40 mg by mouth every evening. , Disp: , Rfl:  .  PROAIR HFA 108 (90 BASE) MCG/ACT inhaler, Inhale 2 puffs into the lungs 4 (four) times daily as needed for wheezing or shortness of breath. , Disp: , Rfl:  .  sertraline (ZOLOFT) 100 MG tablet, Take 1 tablet (100 mg total) by mouth every morning., Disp: 30 tablet, Rfl: 0 .  ipratropium-albuterol (DUONEB) 0.5-2.5 (3) MG/3ML SOLN, Take 3 mLs by nebulization every 2 (two) hours as needed. Use 3 times daily x 4 days then every 2 hours as needed for wheezing/SOB, Disp: 360 mL, Rfl: 2    Review of Systems     Objective:   Physical Exam  Constitutional: She is oriented to person, place, and time. No distress.  Frail female  HENT:  Head: Normocephalic and atraumatic.  Right Ear: External ear normal.  Left Ear: External ear normal.  Mouth/Throat: Oropharynx is clear and moist. No oropharyngeal exudate.  Oxygen on oxygen on  Eyes: Conjunctivae and EOM are normal. Pupils are equal, round, and reactive to light. Right eye exhibits no discharge. Left eye exhibits no discharge. No scleral icterus.  Neck: Normal range of motion. Neck supple. No JVD present. No tracheal deviation present. No thyromegaly present.  Cardiovascular: Normal rate, regular rhythm, normal heart sounds and intact distal pulses.  Exam reveals no gallop and no friction rub.   No murmur heard. Pulmonary/Chest: Effort normal and breath sounds normal. No respiratory distress. She has  no wheezes. She has no rales. She exhibits no tenderness.  Barrel chest  Abdominal: Soft. Bowel sounds are normal. She exhibits no distension and no mass. There is no tenderness. There is no rebound and no guarding.  Musculoskeletal: Normal range of motion. She exhibits no edema or tenderness.  Deconditioned  Lymphadenopathy:    She has no cervical adenopathy.   Neurological: She is alert and oriented to person, place, and time. She has normal reflexes. No cranial nerve deficit. She exhibits normal muscle tone. Coordination normal.  Skin: Skin is warm and dry. No rash noted. She is not diaphoretic. No erythema. No pallor.  Psychiatric: She has a normal mood and affect. Her behavior is normal. Judgment and thought content normal.  Vitals reviewed.  Vitals:   12/07/16 0947 12/07/16 0948  BP:  (!) 144/60  Pulse: 85 79  SpO2: (!) 85% 92%  Weight:  102 lb 9.6 oz (46.5 kg)  Height:  5' (1.524 m)   Estimated body mass index is 20.04 kg/m as calculated from the following:   Height as of this encounter: 5' (1.524 m).   Weight as of this encounter: 102 lb 9.6 oz (46.5 kg).     Assessment:       ICD-9-CM ICD-10-CM   1. CKD (chronic kidney disease), stage III 585.3 N18.3   2. Anemia, chronic disease 285.29 D63.8   3. Acute on chronic respiratory failure with hypoxia (HCC) 518.84 J96.21    799.02    4. Physical deconditioning 799.3 R53.81        Plan:     CKD (chronic kidney disease), stage III Check bmet  Anemia, chronic disease Check cbc  Acute-on-chronic respiratory failure (HCC) Improved from pneumonia in early march 2018 but still hypoxemic  Plan - contnue o2 for now - hopeful you can come off next few weeks to few months - continue duoneb scheduled, lasix and flovent scheduled  - do cxr 12/07/2016   Physical deconditioning This is a major issue for you post pneumonia  Plan Refer home PT    Dr. Brand Males, M.D., [facility].C.P Pulmonary and Critical Care Medicine Staff Physician [facility] Pager: [phone], If no answer or between  15:00h - 7:00h: call [phone]  12/07/2016 10:18 AM

## 2016-12-07 NOTE — Assessment & Plan Note (Signed)
Check cbc 

## 2016-12-07 NOTE — Assessment & Plan Note (Signed)
This is a major issue for you post pneumonia  Plan Refer home PT

## 2016-12-08 ENCOUNTER — Encounter: Payer: Self-pay | Admitting: Podiatry

## 2016-12-08 ENCOUNTER — Ambulatory Visit (INDEPENDENT_AMBULATORY_CARE_PROVIDER_SITE_OTHER): Payer: Medicare Other | Admitting: Podiatry

## 2016-12-08 DIAGNOSIS — B351 Tinea unguium: Secondary | ICD-10-CM | POA: Diagnosis not present

## 2016-12-08 DIAGNOSIS — M79609 Pain in unspecified limb: Secondary | ICD-10-CM | POA: Diagnosis not present

## 2016-12-08 NOTE — Progress Notes (Signed)
   Subjective:    Patient ID: [name], female    DOB: 12/12/1931, 83 y.o.   MRN: [id number]  HPI this patient presents to the office with chief complaint of painful toenails on both feet. He says her nails have grown thick and long, She says her nails appear to be ingrown on the second and third toes of the left foot. She denies any drainage from the sites. She presents the office for evaluation and treatment of her nails   Review of Systems  All other systems reviewed and are negative.      Objective:   Physical Exam GENERAL APPEARANCE: Alert, conversant. Appropriately groomed. No acute distress.  VASCULAR: Pedal pulses palpable at  [facility] and PT bilateral.  Capillary refill time is immediate to all digits,  Normal temperature gradient.    NEUROLOGIC: sensation is normal to 5.07 monofilament at 5/5 sites bilateral.  Light touch is intact bilateral, Muscle strength normal.  MUSCULOSKELETAL: acceptable muscle strength, tone and stability bilateral.  Intrinsic muscluature intact bilateral.  Rectus appearance of foot and digits noted bilateral.   DERMATOLOGIC: skin color, texture, and turgor are within normal limits.  No preulcerative lesions or ulcers  are seen, no interdigital maceration noted.  No open lesions present.  No drainage noted.  Thick disfigured discolored ingrown toenails both feet.         Assessment & Plan:  Onychomycosis B/L   Debridement and grinding of nails both feet. RTC prn  Gardiner Barefoot DPM

## 2016-12-11 ENCOUNTER — Telehealth: Payer: Self-pay | Admitting: Internal Medicine

## 2016-12-11 NOTE — Telephone Encounter (Signed)
Pt is calling, concerned that she has not yet heard from [facility] regarding home health order.  Pt has not called [facility] herself to check the status.  Called [facility], states that they just received the completed order this morning and order is still being processed through insurance- [facility] will be reaching out to patient within the next 2-3 days.  Called pt to make aware.  Gave number to [facility] for her to follow up if she had any more questions about her home health set up.  Nothing further needed at this time.

## 2017-01-01 ENCOUNTER — Ambulatory Visit (INDEPENDENT_AMBULATORY_CARE_PROVIDER_SITE_OTHER): Payer: Medicare Other | Admitting: Adult Health

## 2017-01-01 ENCOUNTER — Ambulatory Visit (INDEPENDENT_AMBULATORY_CARE_PROVIDER_SITE_OTHER)
Admission: RE | Admit: 2017-01-01 | Discharge: 2017-01-01 | Disposition: A | Discharge status: Home / Self Care | Payer: Medicare Other | Source: Ambulatory Visit | Attending: Adult Health | Admitting: Adult Health

## 2017-01-01 ENCOUNTER — Encounter: Payer: Self-pay | Admitting: Adult Health

## 2017-01-01 VITALS — BP 120/78 | HR 77 | Ht 60.0 in | Wt 103.6 lb

## 2017-01-01 DIAGNOSIS — J449 Chronic obstructive pulmonary disease, unspecified: Secondary | ICD-10-CM

## 2017-01-01 DIAGNOSIS — J9611 Chronic respiratory failure with hypoxia: Secondary | ICD-10-CM

## 2017-01-01 DIAGNOSIS — J189 Pneumonia, unspecified organism: Secondary | ICD-10-CM | POA: Diagnosis not present

## 2017-01-01 MED ORDER — IPRATROPIUM-ALBUTEROL 0.5-2.5 (3) MG/3ML IN SOLN: 3.0000 mL | Freq: Four times a day (QID) | RESPIRATORY_TRACT | 5 refills | Status: DC

## 2017-01-01 NOTE — Progress Notes (Signed)
$'@Patient'a$  ID: [name], female    DOB: 13-Feb-1932, 83 y.o.   MRN: [id number]  Chief Complaint  Patient presents with  . Follow-up    COPD     Referring provider: Harlan Stains, MD  HPI: 82 yo female former smoker followed for GOLD II COPD and O2 RF   01/01/2017 Follow up : COPD  Pt returns for 1 month follow up . She was admitted in March for pneumonia and tx w/ abx .  She was started on O2 . She if feeling  Better , cough and congestion are better. No cough for 2 week. Taking Flovent Twice daily  . She is unsure about her nebulizer meds , suppose to be on Duoneb Four times a day  . We discussed her meds and updated her list.   She remains on 2l/m O2 . At rest O2 sats 94% on RA . Walking desats 83% on RA , O2 sats 95% on 2l/m .     Allergies  Allergen Reactions  . Amaryl [Glimepiride]     Side effects  . Avelox [Moxifloxacin Hcl In Nacl]     nausea  . Ciprofloxacin     nausea  . Lipitor [Atorvastatin] Other (See Comments)    myalgias myalgias  . Lisinopril Other (See Comments)    cough cough  . Other Other (See Comments)    Increased cough  . Prevacid [Lansoprazole]     diarrhea  . Spiriva Handihaler [Tiotropium Bromide Monohydrate]     Increased cough  . Symbicort [Budesonide-Formoterol Fumarate]     Increased cough    Immunization History  Administered Date(s) Administered  . Influenza Split 07/19/2014  . Influenza, High Dose Seasonal PF 06/16/2016  . Influenza,inj,Quad PF,36+ Mos 06/19/2015  . Pneumococcal Conjugate-13 09/18/2013  . Pneumococcal Polysaccharide-23 05/14/2006  . Tdap 03/04/2013  . Zoster 08/20/2008, 08/18/2014    Past Medical History:  Diagnosis Date  . Anxiety   . Aortic stenosis   . Arthritis   . Asthma    uses inhaler as needed  . Depression   . Diabetes mellitus   . Emphysema of lung ([place])   . GERD (gastroesophageal reflux disease)   . Glaucoma   . Hyperlipidemia   . Hypertension   . [name]     . Murmur   . Nephrolithiasis     Tobacco History: History  Smoking Status  . Former Smoker  . Packs/day: 2.00  . Years: 15.00  . Types: Cigarettes  . Quit date: 09/18/1972  Smokeless Tobacco  . Never Used   Counseling given: Not Answered   Outpatient Encounter Prescriptions as of 01/01/2017  Medication Sig  . acetaminophen (TYLENOL) 500 MG tablet Take 500 mg by mouth every 6 (six) hours as needed for headache.  . albuterol (PROVENTIL) (2.5 MG/3ML) 0.083% nebulizer solution Take 3 mLs (2.5 mg total) by nebulization every 6 (six) hours as needed for wheezing.  [name] amLODipine (NORVASC) 10 MG tablet Take 1 tablet (10 mg total) by mouth daily.  [name] aspirin EC 81 MG tablet Take 81 mg by mouth at bedtime.  . cholecalciferol (VITAMIN D) 1000 units tablet Take 1,000 Units by mouth daily.  . cyanocobalamin 500 MCG tablet Take 500 mcg by mouth at bedtime.  . fluticasone (FLOVENT HFA) 220 MCG/ACT inhaler Inhale 2 puffs into the lungs 2 (two) times daily.  . furosemide (LASIX) 40 MG tablet Take 1 tablet (40 mg total) by mouth every other day. (Patient taking differently: Take  40 mg by mouth daily. )  . glimepiride (AMARYL) 4 MG tablet Take 4 mg by mouth daily with breakfast.   . ibandronate (BONIVA) 150 MG tablet Take 150 mg by mouth every 30 (thirty) days. Take in the morning with a full glass of water, on an empty stomach, and do not take anything else by mouth or lie down for the next 30 min.  [name] ipratropium-albuterol (DUONEB) 0.5-2.5 (3) MG/3ML SOLN Take 3 mLs by nebulization 4 (four) times daily.  . irbesartan (AVAPRO) 150 MG tablet Take 1 tablet (150 mg total) by mouth daily.  . Magnesium 250 MG TABS Take 250 mg by mouth at bedtime.   . Multiple Vitamins-Minerals (OCUVITE-LUTEIN PO) Take 1 tablet by mouth every morning.   . nystatin (MYCOSTATIN) 100000 UNIT/ML suspension Take 5 mLs by mouth daily.  . pantoprazole (PROTONIX) 40 MG tablet Take 40 mg by mouth daily.  . pravastatin (PRAVACHOL) 40  MG tablet Take 40 mg by mouth every evening.   [name] PROAIR HFA 108 (90 BASE) MCG/ACT inhaler Inhale 2 puffs into the lungs 4 (four) times daily as needed for wheezing or shortness of breath.   . sertraline (ZOLOFT) 100 MG tablet Take 1 tablet (100 mg total) by mouth every morning.  . [DISCONTINUED] ipratropium-albuterol (DUONEB) 0.5-2.5 (3) MG/3ML SOLN Take 3 mLs by nebulization every 2 (two) hours as needed. Use 3 times daily x 4 days then every 2 hours as needed for wheezing/SOB  . benzonatate (TESSALON) 100 MG capsule Take 1 capsule (100 mg total) by mouth 3 (three) times daily as needed for cough. (Patient not taking: Reported on 01/01/2017)  . [DISCONTINUED] albuterol (PROVENTIL HFA;VENTOLIN HFA) 108 (90 Base) MCG/ACT inhaler Inhale 2 puffs into the lungs every 6 (six) hours as needed for wheezing or shortness of breath.   No facility-administered encounter medications on file as of 01/01/2017.      Review of Systems  Constitutional:   No  weight loss, night sweats,  Fevers, chills,  +fatigue, or  lassitude.  HEENT:   No headaches,  Difficulty swallowing,  Tooth/dental problems, or  Sore throat,                No sneezing, itching, ear ache, nasal congestion, post nasal drip,   CV:  No chest pain,  Orthopnea, PND, swelling in lower extremities, anasarca, dizziness, palpitations, syncope.   GI  No heartburn, indigestion, abdominal pain, nausea, vomiting, diarrhea, change in bowel habits, loss of appetite, bloody stools.   Resp:   No chest wall deformity  Skin: no rash or lesions.  GU: no dysuria, change in color of urine, no urgency or frequency.  No flank pain, no hematuria   MS:  No joint pain or swelling.  No decreased range of motion.  No back pain.    Physical Exam  BP 120/78 (BP Location: Left Arm, Cuff Size: Normal)   Pulse 77   Ht 5' (1.524 m)   Wt 103 lb 9.6 oz (47 kg)   SpO2 91%   BMI 20.23 kg/m   GEN: A/Ox3; pleasant , NAD, thin and frail    HEENT:  [place]/AT,   EACs-clear, TMs-wnl, NOSE-clear, THROAT-clear, no lesions, no postnasal drip or exudate noted.   NECK:  Supple w/ fair ROM; no JVD; normal carotid impulses w/o bruits; no thyromegaly or nodules palpated; no lymphadenopathy.    RESP  Decreased BS in bases . . no accessory muscle use, no dullness to percussion  CARD:  RRR, no m/r/g,  no peripheral edema, pulses intact, no cyanosis or clubbing.  GI:   Soft & nt; nml bowel sounds; no organomegaly or masses detected.   Musco: Warm bil, no deformities or joint swelling noted.   Neuro: alert, no focal deficits noted.    Skin: Warm, no lesions or rashes    Lab Results:   BNP  Imaging: Dg Chest 2 View  Result Date: 12/07/2016 CLINICAL DATA:  83 year old female status post antibiotic course for recent pneumonia. Continued productive cough. Subsequent encounter. EXAM: CHEST  2 VIEW COMPARISON:  11/20/2016 and earlier. FINDINGS: Pulmonary hyperinflation. Stable lung volumes. Stable cardiac size and mediastinal contours. Previous cardiac valve replacement. Extensive Calcified aortic atherosclerosis. Chronic small gastric hiatal hernia. No pneumothorax or pulmonary edema. No definite pleural effusion. Chronic streaky and patchy lung base opacity is stable and only mildly worse than baseline appearance (05/01/2016). No new pulmonary opacity or areas of worsening ventilation. Osteopenia. No acute osseous abnormality identified. IMPRESSION: 1. Chronic lung disease with unresolved mild streaky bibasilar opacity suspicious for continued or recurrent infectious exacerbation. No definite pleural effusion. No new areas of pulmonary opacity. 2.  Calcified aortic atherosclerosis. Electronically Signed   By: Genevie Ann M.D.   On: 12/07/2016 13:37     Assessment & Plan:   Chronic obstructive pulmonary disease ([place]) Recent flare with PNA now resolving  Medication education , list updated   Plan  Patient Instructions  Take Duoneb Four times a day  .  Continue  on Flovent 2 puffs Twice daily  , rinse after use.  Albuterol is your rescue medicine , only use As needed  For wheezing /shortness of breath .  Continue on oxygen 2l/m with activity and At bedtime   Chest xray today .  Follow up with Dr. Chase Caller in 6 weeks and As needed   Please contact office for sooner follow up if symptoms do not improve or worsen or seek emergency care       PNA (pneumonia) Clinically improving ,  follow up serial CXR for clearance .      Desiree Edison, NP 01/01/2017

## 2017-01-01 NOTE — Addendum Note (Signed)
Addended by: Parke Poisson E on: 01/01/2017 05:39 PM   Modules accepted: Orders

## 2017-01-01 NOTE — Patient Instructions (Signed)
Take Duoneb Four times a day  .  Continue on Flovent 2 puffs Twice daily  , rinse after use.  Albuterol is your rescue medicine , only use As needed  For wheezing /shortness of breath .  Continue on oxygen 2l/m with activity and At bedtime   Chest xray today .  Follow up with Dr. Chase Caller in 6 weeks and As needed   Please contact office for sooner follow up if symptoms do not improve or worsen or seek emergency care

## 2017-01-01 NOTE — Assessment & Plan Note (Signed)
Clinically improving ,  follow up serial CXR for clearance .

## 2017-01-01 NOTE — Assessment & Plan Note (Signed)
Cont on O2 w/ act and At bedtime   

## 2017-01-01 NOTE — Assessment & Plan Note (Signed)
Recent flare with PNA now resolving  Medication education , list updated   Plan  Patient Instructions  Take Duoneb Four times a day  .  Continue on Flovent 2 puffs Twice daily  , rinse after use.  Albuterol is your rescue medicine , only use As needed  For wheezing /shortness of breath .  Continue on oxygen 2l/m with activity and At bedtime   Chest xray today .  Follow up with Dr. Chase Caller in 6 weeks and As needed   Please contact office for sooner follow up if symptoms do not improve or worsen or seek emergency care

## 2017-01-02 NOTE — Progress Notes (Signed)
Called spoke with patient, advised of cxr results / recs as stated by TP.  Pt verbalized her understanding and denied any questions. 

## 2017-01-22 ENCOUNTER — Ambulatory Visit (INDEPENDENT_AMBULATORY_CARE_PROVIDER_SITE_OTHER)
Admission: RE | Admit: 2017-01-22 | Discharge: 2017-01-22 | Disposition: A | Discharge status: Home / Self Care | Payer: Medicare Other | Source: Ambulatory Visit | Attending: Adult Health | Admitting: Adult Health

## 2017-01-22 ENCOUNTER — Ambulatory Visit (INDEPENDENT_AMBULATORY_CARE_PROVIDER_SITE_OTHER): Payer: Medicare Other | Admitting: Adult Health

## 2017-01-22 ENCOUNTER — Encounter: Payer: Self-pay | Admitting: Adult Health

## 2017-01-22 VITALS — BP 116/68 | HR 78 | Ht 60.0 in | Wt 106.4 lb

## 2017-01-22 DIAGNOSIS — J9611 Chronic respiratory failure with hypoxia: Secondary | ICD-10-CM

## 2017-01-22 DIAGNOSIS — J449 Chronic obstructive pulmonary disease, unspecified: Secondary | ICD-10-CM | POA: Diagnosis not present

## 2017-01-22 DIAGNOSIS — J189 Pneumonia, unspecified organism: Secondary | ICD-10-CM

## 2017-01-22 NOTE — Progress Notes (Signed)
$'@Patient'h$  ID: [name], female    DOB: 20-Oct-1931, 83 y.o.   MRN: [id number]  Chief Complaint  Patient presents with  . Follow-up    PNA /COPD     Referring provider: Harlan Stains, MD  HPI: 83 yo female former smoker followed for GOLD II COPD and O2 RF on O2   TEST  May 2016 DLCO 6.1/34%, postbronchodilator FEV1 1.2 L/81% without broncho-dilator change] in the setting of moderate hiatal hernia. Alpha 1-MM 2013 Clear CT chest May 2016  01/22/2017 Follow up : COPD /PNA  Patient returns for a one-month follow-up. Her COPD and recent pneumonia. Patient was admitted in March for pneumonia.  She was treated with abx , she has had been doing much better now . She has been exercising . Feels her muscles are getting stronger. Is very proud she is able to get out of chair without pushing off.  CXR on return ov in April showed improved aeration with residual atx.  Remains on Flovent Twice daily  And Duoneb neb Four times a day    She is suppose to be on o2 at 2l/m . She does not like to wear this . O2 sats on arrival today were 81% on room air.     Allergies  Allergen Reactions  . Amaryl [Glimepiride]     Side effects  . Avelox [Moxifloxacin Hcl In Nacl]     nausea  . Ciprofloxacin     nausea  . Lipitor [Atorvastatin] Other (See Comments)    myalgias myalgias  . Lisinopril Other (See Comments)    cough cough  . Other Other (See Comments)    Increased cough  . Prevacid [Lansoprazole]     diarrhea  . Spiriva Handihaler [Tiotropium Bromide Monohydrate]     Increased cough  . Symbicort [Budesonide-Formoterol Fumarate]     Increased cough    Immunization History  Administered Date(s) Administered  . Influenza Split 07/19/2014  . Influenza, High Dose Seasonal PF 06/16/2016  . Influenza,inj,Quad PF,36+ Mos 06/19/2015  . Pneumococcal Conjugate-13 09/18/2013  . Pneumococcal Polysaccharide-23 05/14/2006  . Tdap 03/04/2013  . Zoster 08/20/2008, 08/18/2014    Past  Medical History:  Diagnosis Date  . Anxiety   . Aortic stenosis   . Arthritis   . Asthma    uses inhaler as needed  . Depression   . Diabetes mellitus   . Emphysema of lung ([place])   . GERD (gastroesophageal reflux disease)   . Glaucoma   . Hyperlipidemia   . Hypertension   . [name]   . Murmur   . Nephrolithiasis     Tobacco History: History  Smoking Status  . Former Smoker  . Packs/day: 2.00  . Years: 15.00  . Types: Cigarettes  . Quit date: 09/18/1972  Smokeless Tobacco  . Never Used   Counseling given: Not Answered   Outpatient Encounter Prescriptions as of 01/22/2017  Medication Sig  . acetaminophen (TYLENOL) 500 MG tablet Take 500 mg by mouth every 6 (six) hours as needed for headache.  . albuterol (PROVENTIL) (2.5 MG/3ML) 0.083% nebulizer solution Take 3 mLs (2.5 mg total) by nebulization every 6 (six) hours as needed for wheezing.  [name] amLODipine (NORVASC) 10 MG tablet Take 1 tablet (10 mg total) by mouth daily.  [name] aspirin EC 81 MG tablet Take 81 mg by mouth at bedtime.  . benzonatate (TESSALON) 100 MG capsule Take 1 capsule (100 mg total) by mouth 3 (three) times daily as needed for  cough.  . cholecalciferol (VITAMIN D) 1000 units tablet Take 1,000 Units by mouth daily.  . cyanocobalamin 500 MCG tablet Take 500 mcg by mouth at bedtime.  . fluticasone (FLOVENT HFA) 220 MCG/ACT inhaler Inhale 2 puffs into the lungs 2 (two) times daily.  . furosemide (LASIX) 40 MG tablet Take 1 tablet (40 mg total) by mouth every other day. (Patient taking differently: Take 40 mg by mouth daily. )  . glimepiride (AMARYL) 4 MG tablet Take 4 mg by mouth daily with breakfast.   . ibandronate (BONIVA) 150 MG tablet Take 150 mg by mouth every 30 (thirty) days. Take in the morning with a full glass of water, on an empty stomach, and do not take anything else by mouth or lie down for the next 30 min.  [name] ipratropium-albuterol (DUONEB) 0.5-2.5 (3) MG/3ML SOLN Take 3 mLs by  nebulization 4 (four) times daily. Dx: J44.9  . irbesartan (AVAPRO) 150 MG tablet Take 1 tablet (150 mg total) by mouth daily.  . Magnesium 250 MG TABS Take 250 mg by mouth at bedtime.   . Multiple Vitamins-Minerals (OCUVITE-LUTEIN PO) Take 1 tablet by mouth every morning.   . nystatin (MYCOSTATIN) 100000 UNIT/ML suspension Take 5 mLs by mouth daily.  . pantoprazole (PROTONIX) 40 MG tablet Take 40 mg by mouth daily.  . pravastatin (PRAVACHOL) 40 MG tablet Take 40 mg by mouth every evening.   [name] PROAIR HFA 108 (90 BASE) MCG/ACT inhaler Inhale 2 puffs into the lungs 4 (four) times daily as needed for wheezing or shortness of breath.   . sertraline (ZOLOFT) 100 MG tablet Take 1 tablet (100 mg total) by mouth every morning.   No facility-administered encounter medications on file as of 01/22/2017.      Review of Systems  Constitutional:   No  weight loss, night sweats,  Fevers, chills,  +fatigue, or  lassitude.  HEENT:   No headaches,  Difficulty swallowing,  Tooth/dental problems, or  Sore throat,                No sneezing, itching, ear ache, nasal congestion, post nasal drip,   CV:  No chest pain,  Orthopnea, PND, swelling in lower extremities, anasarca, dizziness, palpitations, syncope.   GI  No heartburn, indigestion, abdominal pain, nausea, vomiting, diarrhea, change in bowel habits, loss of appetite, bloody stools.   Resp:  No excess mucus, no productive cough,  No non-productive cough,  No coughing up of blood.  No change in color of mucus.  No wheezing.  No chest wall deformity  Skin: no rash or lesions.  GU: no dysuria, change in color of urine, no urgency or frequency.  No flank pain, no hematuria   MS:  No joint pain or swelling.  No decreased range of motion.  No back pain.    Physical Exam  BP 116/68 (BP Location: Left Arm, Cuff Size: Normal)   Pulse 78   Ht 5' (1.524 m)   Wt 106 lb 6.4 oz (48.3 kg)   SpO2 92%   BMI 20.78 kg/m   GEN: A/Ox3; pleasant , NAD, thin  female .    HEENT:  [place]/AT,  EACs-clear, TMs-wnl, NOSE-clear, THROAT-clear, no lesions, no postnasal drip or exudate noted.   NECK:  Supple w/ fair ROM; no JVD; normal carotid impulses w/o bruits; no thyromegaly or nodules palpated; no lymphadenopathy.    RESP  Decreased BS in bases  w/o, wheezes/ rales/ or rhonchi. no accessory muscle use, no dullness to percussion  CARD:  RRR, no m/r/g, no peripheral edema, pulses intact, no cyanosis or clubbing.  GI:   Soft & nt; nml bowel sounds; no organomegaly or masses detected.   Musco: Warm bil, no deformities or joint swelling noted.   Neuro: alert, no focal deficits noted.    Skin: Warm, no lesions or rashes   Lab Results:   BNP  Imaging: Dg Chest 2 View  Result Date: 01/01/2017 CLINICAL DATA:  Follow-up pneumonia. EXAM: CHEST  2 VIEW COMPARISON:  12/07/2016.  11/20/2016.  11/19/2016.  05/01/2016. FINDINGS: Mediastinum hilar structures are stable. Prior cardiac valve replacement. Heart size normal. Pulmonary vascularity is normal. Improved aeration of both lung bases with mild residual atelectasis. A component of chronic interstitial lung disease may be present. Bibasilar pleural-parenchymal thickening again noted consistent scarring. No pneumothorax. Diffuse osteopenia degenerative change thoracic spine. Stable mild lower thoracic vertebral body compression fracture. IMPRESSION: 1. Improved aeration of both lung bases with mild residual bibasilar atelectasis. No evidence of progressive infiltrate. A component of chronic interstitial lung disease may be present . Bibasilar pleural-parenchymal thickening consistent with scarring again noted. 2. Prior cardiac valve replacement.  Heart size stable. Electronically Signed   By: Marcello Moores  Register   On: 01/01/2017 11:15     Assessment & Plan:   COPD GOLD II  Doing well since flare   Plan  Patient Instructions  Rinse and gargle after inhaler and neb use.  Continue on Duoneb Four times a day  .   Continue on Flovent 2 puffs Twice daily.  Albuterol is your rescue medicine , only use As needed  For wheezing /shortness of breath .  Continue on oxygen 2l/m with activity and At bedtime  -order for POC .  Chest xray today .  Follow up with Dr. Chase Caller in 2 months and As needed   Please contact office for sooner follow up if symptoms do not improve or worsen or seek emergency care       Chronic respiratory failure ([place]) Cont on o2 . Keep O2 sats >90%  Discussed compliance  Try POC to see if helps with compliance.   PNA (pneumonia) Follow up serial CXR for clearance  Clinically improved.      Rexene Edison, NP 01/22/2017

## 2017-01-22 NOTE — Addendum Note (Signed)
Addended by: Parke Poisson E on: 01/22/2017 11:00 AM   Modules accepted: Orders

## 2017-01-22 NOTE — Assessment & Plan Note (Signed)
Cont on o2 . Keep O2 sats >90%  Discussed compliance  Try POC to see if helps with compliance.

## 2017-01-22 NOTE — Patient Instructions (Addendum)
Rinse and gargle after inhaler and neb use.  Continue on Duoneb Four times a day  .  Continue on Flovent 2 puffs Twice daily.  Albuterol is your rescue medicine , only use As needed  For wheezing /shortness of breath .  Continue on oxygen 2l/m with activity and At bedtime  -order for POC .  Chest xray today .  Follow up with Dr. Chase Caller in 2 months and As needed   Please contact office for sooner follow up if symptoms do not improve or worsen or seek emergency care

## 2017-01-22 NOTE — Assessment & Plan Note (Signed)
Follow up serial CXR for clearance  Clinically improved.

## 2017-01-22 NOTE — Progress Notes (Signed)
Called spoke with patient, advised of cxr results / recs as stated by TP.  Pt verbalized her understanding and denied any questions. 

## 2017-01-22 NOTE — Assessment & Plan Note (Signed)
Doing well since flare   Plan  Patient Instructions  Rinse and gargle after inhaler and neb use.  Continue on Duoneb Four times a day  .  Continue on Flovent 2 puffs Twice daily.  Albuterol is your rescue medicine , only use As needed  For wheezing /shortness of breath .  Continue on oxygen 2l/m with activity and At bedtime  -order for POC .  Chest xray today .  Follow up with Dr. Chase Caller in 2 months and As needed   Please contact office for sooner follow up if symptoms do not improve or worsen or seek emergency care

## 2017-02-13 ENCOUNTER — Ambulatory Visit: Payer: Medicare Other | Admitting: Internal Medicine

## 2017-03-02 ENCOUNTER — Encounter (HOSPITAL_COMMUNITY): Payer: Self-pay | Admitting: *Deleted

## 2017-03-02 ENCOUNTER — Encounter: Payer: Self-pay | Admitting: Podiatry

## 2017-03-02 ENCOUNTER — Ambulatory Visit (INDEPENDENT_AMBULATORY_CARE_PROVIDER_SITE_OTHER): Payer: Medicare Other | Admitting: Podiatry

## 2017-03-02 ENCOUNTER — Emergency Department (HOSPITAL_COMMUNITY): Payer: Medicare Other

## 2017-03-02 ENCOUNTER — Emergency Department (HOSPITAL_COMMUNITY)
Admission: EM | Admit: 2017-03-02 | Discharge: 2017-03-03 | Disposition: A | Discharge status: Home / Self Care | Payer: Medicare Other | Attending: Emergency Medicine | Admitting: Emergency Medicine

## 2017-03-02 DIAGNOSIS — M79609 Pain in unspecified limb: Secondary | ICD-10-CM | POA: Diagnosis not present

## 2017-03-02 DIAGNOSIS — I129 Hypertensive chronic kidney disease with stage 1 through stage 4 chronic kidney disease, or unspecified chronic kidney disease: Secondary | ICD-10-CM | POA: Insufficient documentation

## 2017-03-02 DIAGNOSIS — E1122 Type 2 diabetes mellitus with diabetic chronic kidney disease: Secondary | ICD-10-CM | POA: Insufficient documentation

## 2017-03-02 DIAGNOSIS — Z87891 Personal history of nicotine dependence: Secondary | ICD-10-CM | POA: Insufficient documentation

## 2017-03-02 DIAGNOSIS — N183 Chronic kidney disease, stage 3 (moderate): Secondary | ICD-10-CM | POA: Insufficient documentation

## 2017-03-02 DIAGNOSIS — R55 Syncope and collapse: Secondary | ICD-10-CM | POA: Diagnosis present

## 2017-03-02 DIAGNOSIS — Z951 Presence of aortocoronary bypass graft: Secondary | ICD-10-CM | POA: Insufficient documentation

## 2017-03-02 DIAGNOSIS — J449 Chronic obstructive pulmonary disease, unspecified: Secondary | ICD-10-CM | POA: Insufficient documentation

## 2017-03-02 DIAGNOSIS — B351 Tinea unguium: Secondary | ICD-10-CM | POA: Diagnosis not present

## 2017-03-02 DIAGNOSIS — E876 Hypokalemia: Secondary | ICD-10-CM | POA: Diagnosis not present

## 2017-03-02 DIAGNOSIS — J45909 Unspecified asthma, uncomplicated: Secondary | ICD-10-CM | POA: Insufficient documentation

## 2017-03-02 DIAGNOSIS — Z79899 Other long term (current) drug therapy: Secondary | ICD-10-CM | POA: Diagnosis not present

## 2017-03-02 LAB — BASIC METABOLIC PANEL
Anion gap: 11 (ref 5–15)
BUN: 51 mg/dL — ABNORMAL HIGH (ref 6–20)
CALCIUM: 8.9 mg/dL (ref 8.9–10.3)
CHLORIDE: 101 mmol/L (ref 101–111)
CO2: 23 mmol/L (ref 22–32)
CREATININE: 1.52 mg/dL — AB (ref 0.44–1.00)
GFR calc Af Amer: 36 mL/min — ABNORMAL LOW (ref >60)
GFR calc non Af Amer: 31 mL/min — ABNORMAL LOW (ref >60)
GLUCOSE: 127 mg/dL — AB (ref 65–99)
Potassium: 3.2 mmol/L — ABNORMAL LOW (ref 3.5–5.1)
Sodium: 135 mmol/L (ref 135–145)

## 2017-03-02 LAB — URINALYSIS, ROUTINE W REFLEX MICROSCOPIC
BILIRUBIN URINE: NEGATIVE
Glucose, UA: NEGATIVE mg/dL
HGB URINE DIPSTICK: NEGATIVE
KETONES UR: NEGATIVE mg/dL
Nitrite: NEGATIVE
Protein, ur: 30 mg/dL — AB
SPECIFIC GRAVITY, URINE: 1.014 (ref 1.005–1.030)
pH: 5 (ref 5.0–8.0)

## 2017-03-02 LAB — CBC
HCT: 35.3 % — ABNORMAL LOW (ref 36.0–46.0)
HEMOGLOBIN: 10.7 g/dL — AB (ref 12.0–15.0)
MCH: 24 pg — AB (ref 26.0–34.0)
MCHC: 30.3 g/dL (ref 30.0–36.0)
MCV: 79.1 fL (ref 78.0–100.0)
Platelets: 122 10*3/uL — ABNORMAL LOW (ref 150–400)
RBC: 4.46 MIL/uL (ref 3.87–5.11)
RDW: 16.1 % — ABNORMAL HIGH (ref 11.5–15.5)
WBC: 2.9 10*3/uL — ABNORMAL LOW (ref 4.0–10.5)

## 2017-03-02 LAB — I-STAT TROPONIN, ED: TROPONIN I, POC: 0.01 ng/mL (ref 0.00–0.08)

## 2017-03-02 MED ORDER — POTASSIUM CHLORIDE CRYS ER 20 MEQ PO TBCR
40.0000 meq | EXTENDED_RELEASE_TABLET | Freq: Once | ORAL | Status: AC
  Administered 2017-03-03: 40 meq via ORAL
  Filled 2017-03-02: qty 2

## 2017-03-02 NOTE — ED Triage Notes (Signed)
Pt is brought in by family after having a syncopal episode today while standing still. Pt states she also had a syncopal episode last night and was unable to get up off the floor. Pt states she was on the floor from 6-11pm. Pt was seen by PCP yesterday and states they thought she was dehydrated.

## 2017-03-02 NOTE — ED Notes (Signed)
ED Provider at bedside. 

## 2017-03-02 NOTE — ED Provider Notes (Signed)
[facility] DEPT Provider Note   CSN: [id number] Arrival date & time: 03/02/17  1633    History   Chief Complaint Chief Complaint  Patient presents with  . Loss of Consciousness    HPI [name] is a 83 y.o. female.  83 year old female with a history of hypertension, diabetes mellitus, esophageal reflux, dyslipidemia, emphysema on chronic 2L oxygen via nasal cannula, and irritable bowel syndrome presents to the emergency department for evaluation of syncope. Patient reports 2 episodes of syncope over the last 3 days. She states that symptoms first occurred Wednesday evening. She reports being in the kitchen, getting food for her cat, when she began to feel "swimmy headed". Patient recalls thinking, "I should really get back to the couch and put on my oxygen". She states that she was not able to make it to the couch and woke up on the floor. Patient had a subsequent episode of syncope yesterday night. Symptoms also preceded by sensation of lightheadedness. Patient was not wearing her oxygen at the time of this syncopal event as well. Patient did see her primary care doctor yesterday afternoon for evaluation of these symptoms. The patient's PCP thought the patient may be dehydrated. She lowered her Lasix dose at this visit. ED evaluation was prompted this evening after patient suffered a near syncopal event. Husband cannot recall whether the patient was wearing her oxygen at the time of this occurrence. Patient denies any chest pain or shortness of breath preceding her syncope. No recent fevers or worsening cough. No reported head injury or trauma secondary to syncope.   The history is provided by the patient, the spouse and a relative. No language interpreter was used.    Past Medical History:  Diagnosis Date  . Anxiety   . Aortic stenosis   . Arthritis   . Asthma    uses inhaler as needed  . Depression   . Diabetes mellitus   . Emphysema of lung ([place])   . GERD  (gastroesophageal reflux disease)   . Glaucoma   . Hyperlipidemia   . Hypertension   . [name]   . Murmur   . Nephrolithiasis     Patient Active Problem List   Diagnosis Date Noted  . Anemia, chronic disease 12/07/2016  . Chronic obstructive pulmonary disease ([place])   . Chronic respiratory failure ([place]) 11/19/2016  . CKD (chronic kidney disease), stage III 11/19/2016  . Diabetes mellitus type 2, uncontrolled ([place]) 11/19/2016  . PNA (pneumonia) 11/19/2016  . Acute on chronic diastolic heart failure ([place]) 11/19/2016  . Pulmonary HTN ([place]) 11/19/2016  . HLD (hyperlipidemia) 11/19/2016  . Hiatal hernia 05/01/2016  . History of bacterial pneumonia 05/01/2016  . COPD exacerbation ([place]) 11/18/2015  . Physical deconditioning 07/24/2013  . S/P AVR (aortic valve replacement) 07/23/2013  . S/P CABG x 1 07/23/2013  . Aortic stenosis 06/10/2013  . Essential hypertension 06/10/2013  . COPD GOLD II  10/26/2011    Past Surgical History:  Procedure Laterality Date  . ABDOMINAL HYSTERECTOMY    . ANKLE SURGERY  1998   d/t fx.  Left ankle  . ANTERIOR AND POSTERIOR REPAIR  06/21/2011   Procedure: ANTERIOR (CYSTOCELE) AND POSTERIOR REPAIR (RECTOCELE);  Surgeon: Maisie Fus;  Location: [facility];  Service: Gynecology;  Laterality: N/A;  . AORTIC VALVE REPLACEMENT N/A 07/17/2013   Procedure: AORTIC VALVE REPLACEMENT (AVR);  Surgeon: Ivin Poot, MD;  Location: [facility];  Service: Open Heart Surgery;  Laterality: N/A;  . CARDIAC  CATHETERIZATION    . CARDIAC VALVE REPLACEMENT    . CORONARY ANGIOPLASTY    . CORONARY ARTERY BYPASS GRAFT N/A 07/17/2013   Procedure: CORONARY ARTERY BYPASS GRAFT, TIMES ONE, ON PUMP, USING RIGHT INTERNAL MAMMARY ARTERY.;  Surgeon: Ivin Poot, MD;  Location: [facility];  Service: Open Heart Surgery;  Laterality: N/A;  . FACIAL COSMETIC SURGERY     face lift  . INTRAOPERATIVE TRANSESOPHAGEAL ECHOCARDIOGRAM N/A 07/17/2013   Procedure: INTRAOPERATIVE  TRANSESOPHAGEAL ECHOCARDIOGRAM;  Surgeon: Ivin Poot, MD;  Location: [facility];  Service: Open Heart Surgery;  Laterality: N/A;  . LAPAROSCOPIC ASSISTED VAGINAL HYSTERECTOMY  06/21/2011   Procedure: LAPAROSCOPIC ASSISTED VAGINAL HYSTERECTOMY;  Surgeon: Maisie Fus;  Location: [facility];  Service: Gynecology;  Laterality: N/A;  . LEFT AND RIGHT HEART CATHETERIZATION WITH CORONARY ANGIOGRAM N/A 06/27/2013   Procedure: LEFT AND RIGHT HEART CATHETERIZATION WITH CORONARY ANGIOGRAM;  Surgeon: Ramond Dial, MD;  Location: [facility];  Service: Cardiovascular;  Laterality: N/A;  . NOSE SURGERY    . SALPINGOOPHORECTOMY  06/21/2011   Procedure: SALPINGO OOPHERECTOMY;  Surgeon: Maisie Fus;  Location: [facility];  Service: Gynecology;  Laterality: Bilateral;  . VAGINAL PROLAPSE REPAIR  06/21/2011   Procedure: SACROPEXY;  Surgeon: Maisie Fus;  Location: [facility];  Service: Gynecology;  Laterality: N/A;  sacrospinous ligament suspension    OB History    No data available       Home Medications    Prior to Admission medications   Medication Sig Start Date End Date Taking? Authorizing Provider  acetaminophen (TYLENOL) 500 MG tablet Take 500 mg by mouth every 6 (six) hours as needed for headache.   Yes [provider]  albuterol (PROVENTIL) (2.5 MG/3ML) 0.083% nebulizer solution Take 3 mLs (2.5 mg total) by nebulization every 6 (six) hours as needed for wheezing. 11/23/16  Yes Eugenie Filler, MD  amLODipine (NORVASC) 10 MG tablet Take 1 tablet (10 mg total) by mouth daily. 10/19/15  Yes Lelon Perla, MD  aspirin EC 81 MG tablet Take 81 mg by mouth at bedtime.   Yes [provider]  benzonatate (TESSALON) 100 MG capsule Take 1 capsule (100 mg total) by mouth 3 (three) times daily as needed for cough. 11/23/16  Yes Eugenie Filler, MD  cholecalciferol (VITAMIN D) 1000 units tablet Take 1,000 Units by mouth daily.   Yes [provider]  cyanocobalamin 500 MCG tablet Take  500 mcg by mouth at bedtime.   Yes [provider]  fluticasone (FLOVENT HFA) 220 MCG/ACT inhaler Inhale 2 puffs into the lungs 2 (two) times daily.   Yes [provider]  furosemide (LASIX) 40 MG tablet Take 1 tablet (40 mg total) by mouth every other day. Patient taking differently: Take 40 mg by mouth daily.  11/23/16  Yes Eugenie Filler, MD  glimepiride (AMARYL) 4 MG tablet Take 4 mg by mouth daily with breakfast.    Yes [provider]  ibandronate (BONIVA) 150 MG tablet Take 150 mg by mouth every 30 (thirty) days. Take in the morning with a full glass of water, on an empty stomach, and do not take anything else by mouth or lie down for the next 30 min.   Yes [provider]  ipratropium-albuterol (DUONEB) 0.5-2.5 (3) MG/3ML SOLN Take 3 mLs by nebulization 4 (four) times daily. Dx: J44.9 01/01/17  Yes Parrett, Tammy S, NP  irbesartan (AVAPRO) 150 MG tablet Take 1 tablet (150 mg total)  by mouth daily. 10/12/14  Yes Lelon Perla, MD  Magnesium 250 MG TABS Take 250 mg by mouth at bedtime.    Yes [provider]  Multiple Vitamins-Minerals (OCUVITE-LUTEIN PO) Take 1 tablet by mouth every morning.    Yes [provider]  pantoprazole (PROTONIX) 40 MG tablet Take 40 mg by mouth daily.   Yes [provider]  pravastatin (PRAVACHOL) 40 MG tablet Take 40 mg by mouth every evening.    Yes [provider]  PROAIR HFA 108 (90 BASE) MCG/ACT inhaler Inhale 2 puffs into the lungs 4 (four) times daily as needed for wheezing or shortness of breath.  02/24/14  Yes [provider]  sertraline (ZOLOFT) 100 MG tablet Take 1 tablet (100 mg total) by mouth every morning. 11/24/16  Yes Eugenie Filler, MD  doxycycline (VIBRAMYCIN) 100 MG capsule Take 1 capsule (100 mg total) by mouth 2 (two) times daily. 03/03/17   Antonietta Breach, PA-C    Family History Family History  Problem Relation Age of Onset  . Emphysema Father   . Colon  cancer Neg Hx   . Esophageal cancer Neg Hx   . Rectal cancer Neg Hx   . Stomach cancer Neg Hx     Social History Social History  Substance Use Topics  . Smoking status: Former Smoker    Packs/day: 2.00    Years: 15.00    Types: Cigarettes    Quit date: 09/18/1972  . Smokeless tobacco: Never Used  . Alcohol use No     Allergies   Amaryl [glimepiride]; Avelox [moxifloxacin hcl in nacl]; Ciprofloxacin; Lipitor [atorvastatin]; Lisinopril; Other; Prevacid [lansoprazole]; Spiriva handihaler [tiotropium bromide monohydrate]; and Symbicort [budesonide-formoterol fumarate]   Review of Systems Review of Systems Ten systems reviewed and are negative for acute change, except as noted in the HPI.    Physical Exam Updated Vital Signs BP (!) 161/67   Pulse 73   Temp 99 F (37.2 C) (Oral)   Resp (!) 23   Ht 5' (1.524 m)   Wt 48.1 kg (106 lb)   SpO2 99%   BMI 20.70 kg/m   Physical Exam  Constitutional: She is oriented to person, place, and time. She appears well-developed and well-nourished. No distress.  Nontoxic and in NAD  HENT:  Head: Normocephalic and atraumatic.  Eyes: Conjunctivae and EOM are normal. No scleral icterus.  Neck: Normal range of motion.  Cardiovascular: Normal rate, regular rhythm and intact distal pulses.   Pulmonary/Chest: Effort normal. No respiratory distress. She has no wheezes.  Decreased breath sounds throughout. Prolonged expiratory phase. SpO2 99% on chronic 2L via [place].  Musculoskeletal: Normal range of motion.  Neurological: She is alert and oriented to person, place, and time. She exhibits normal muscle tone. Coordination normal.  GCS 15. Patient moving all extremities.  Skin: Skin is warm and dry. No rash noted. She is not diaphoretic. No erythema. No pallor.  Psychiatric: She has a normal mood and affect. Her behavior is normal.  Nursing note and vitals reviewed.    ED Treatments / Results  Labs (all labs ordered are listed, but only  abnormal results are displayed) Labs Reviewed  BASIC METABOLIC PANEL - Abnormal; Notable for the following:       Result Value   Potassium 3.2 (*)    Glucose, Bld 127 (*)    BUN 51 (*)    Creatinine, Ser 1.52 (*)    GFR calc non Af Amer 31 (*)    GFR  calc Af Amer 36 (*)    All other components within normal limits  CBC - Abnormal; Notable for the following:    WBC 2.9 (*)    Hemoglobin 10.7 (*)    HCT 35.3 (*)    MCH 24.0 (*)    RDW 16.1 (*)    Platelets 122 (*)    All other components within normal limits  URINALYSIS, ROUTINE W REFLEX MICROSCOPIC - Abnormal; Notable for the following:    APPearance HAZY (*)    Protein, ur 30 (*)    Leukocytes, UA SMALL (*)    Bacteria, UA RARE (*)    Squamous Epithelial / LPF 0-5 (*)    Non Squamous Epithelial 0-5 (*)    All other components within normal limits  I-STAT TROPOININ, ED  CBG MONITORING, ED    EKG  EKG Interpretation  Date/Time:  Friday March 02 2017 16:43:53 EDT Ventricular Rate:  76 PR Interval:  152 QRS Duration: 110 QT Interval:  384 QTC Calculation: 432 R Axis:   -79 Text Interpretation:  Normal sinus rhythm Left axis deviation Right bundle branch block Inferior infarct , age undetermined Anterior infarct , age undetermined Abnormal ECG No significant change since last tracing Confirmed by Addison Lank [id number]) on 03/02/2017 11:19:51 PM       Radiology Dg Chest 2 View  Result Date: 03/02/2017 CLINICAL DATA:  Status post syncope and fall, with acute onset of shortness of breath. Initial encounter. EXAM: CHEST  2 VIEW COMPARISON:  Chest radiograph performed 01/22/2017 FINDINGS: The lungs are well-aerated. Mild right basilar airspace opacity may reflect atelectasis or possibly mild infection. No significant pleural effusion or pneumothorax is seen. Peribronchial thickening is noted. The heart is normal in size; the patient is status post median sternotomy. A moderate hiatal hernia is noted. No acute osseous  abnormalities are seen. IMPRESSION: 1. Mild right basilar airspace opacity may reflect atelectasis or possibly mild infection. Peribronchial thickening noted. 2. No displaced rib fracture seen. 3. Moderate hiatal hernia noted. Electronically Signed   By: Garald Balding M.D.   On: 03/02/2017 23:55    Procedures Procedures (including critical care time)  Medications Ordered in ED Medications  potassium chloride SA (K-DUR,KLOR-CON) CR tablet 40 mEq (40 mEq Oral Given 03/03/17 0042)  doxycycline (VIBRA-TABS) tablet 100 mg (100 mg Oral Given 03/03/17 0042)     Initial Impression / Assessment and Plan / ED Course  I have reviewed the triage vital signs and the nursing notes.  Pertinent labs & imaging results that were available during my care of the patient were reviewed by me and considered in my medical decision making (see chart for details).     11:20 PM Patient presents for syncopal event 2, onset on Wednesday. Both of these syncopal events occurred while the patient was not wearing her oxygen. She is supposed to be on chronic 2L oxygen via nasal cannula. Most recent [facility] pulmonary note references patient's resistance to using her oxygen at all times; she presented to this office visit OFF oxygen and was found to have sats of 81% on RA. I have explained to the patient that her syncopal episodes are likely directly related to hypoxia. Husband expresses concern for fatigue/generalized weakness. This seems similar to when the patient was diagnosed with pneumonia in March. No rales appreciated on exam. No hypoxia while on her chronic home oxygen. Plan to obtain chest x-ray to ensure no developing infiltrate. Anticipate discharge if imaging negative. Laboratory workup reassuring.  12:30 AM Chest  x-ray reviewed. This shows a mild right basilar airspace opacity. Question atelectasis versus mild infection. Given history of emphysema with recent hospitalizations for pneumonia and acute respiratory  failure, will place patient on course of antibiotics. I do not believe this to be the cause of her recent syncope, however. I have impressed upon the patient her need to wear her chronic oxygen at all times. Primary care follow-up advised for repeat assessment. Return precautions given. Patient discharged in stable condition with no unaddressed concerns.   Final Clinical Impressions(s) / ED Diagnoses   Final diagnoses:  Syncope, unspecified syncope type  Hypokalemia    New Prescriptions New Prescriptions   DOXYCYCLINE (VIBRAMYCIN) 100 MG CAPSULE    Take 1 capsule (100 mg total) by mouth 2 (two) times daily.     Antonietta Breach, PA-C 03/03/17 0051    Fatima Blank, MD 03/03/17 [phone]

## 2017-03-02 NOTE — Progress Notes (Signed)
   Subjective:    Patient ID: [name], female    DOB: [birth year], 83 y.o.   MRN: [id number]  HPI this patient presents to the office with chief complaint of painful toenails on both feet. He says her nails have grown thick and long, She says her nails appear to be ingrown on the second and third toes both feet. She denies any drainage from the sites. She presents the office for evaluation and treatment of her nails   Review of Systems  All other systems reviewed and are negative.      Objective:   Physical Exam GENERAL APPEARANCE: Alert, conversant. Appropriately groomed. No acute distress.  VASCULAR: Pedal pulses palpable at  [facility] and PT bilateral.  Capillary refill time is immediate to all digits,  Normal temperature gradient.    NEUROLOGIC: sensation is normal to 5.07 monofilament at 5/5 sites bilateral.  Light touch is intact bilateral, Muscle strength normal.  MUSCULOSKELETAL: acceptable muscle strength, tone and stability bilateral.  HAV  B/L.  DERMATOLOGIC: skin color, texture, and turgor are within normal limits.  No preulcerative lesions or ulcers  are seen, no interdigital maceration noted.  No open lesions present.  No drainage noted.  Thick disfigured discolored ingrown toenails both feet.         Assessment & Plan:  Onychomycosis B/L   Debridement and grinding of nails both feet. RTC prn  Gardiner Barefoot DPM

## 2017-03-03 MED ORDER — DOXYCYCLINE HYCLATE 100 MG PO CAPS: 100.0000 mg | ORAL_CAPSULE | Freq: Two times a day (BID) | ORAL | 0 refills | Status: DC

## 2017-03-03 MED ORDER — DOXYCYCLINE HYCLATE 100 MG PO TABS
100.0000 mg | ORAL_TABLET | Freq: Once | ORAL | Status: AC
  Administered 2017-03-03: 100 mg via ORAL
  Filled 2017-03-03: qty 1

## 2017-03-03 NOTE — Discharge Instructions (Signed)
Your chest x-ray showed possibility of a developing infection. You were placed on doxycycline for this reason. Take this medication as prescribed until finished. Continue with your daily breathing treatments.   Your potassium was slightly low today. You were given oral potassium in the emergency department. This is likely related to your daily use of Lasix. Given that your Lasix dosing was lowered, your potassium level will likely improve without need for daily potassium tablets. Have this level rechecked by your primary care doctor in 1-2 weeks.   Continue using your oxygen at all times. We believe that your loss of consciousness is directly related to lack of oxygen use. You may return to the emergency department for new or concerning symptoms.

## 2017-03-26 ENCOUNTER — Ambulatory Visit: Payer: Medicare Other | Admitting: Internal Medicine

## 2017-03-27 ENCOUNTER — Ambulatory Visit (INDEPENDENT_AMBULATORY_CARE_PROVIDER_SITE_OTHER): Payer: Medicare Other | Admitting: Adult Health

## 2017-03-27 ENCOUNTER — Encounter: Payer: Self-pay | Admitting: Adult Health

## 2017-03-27 ENCOUNTER — Ambulatory Visit (INDEPENDENT_AMBULATORY_CARE_PROVIDER_SITE_OTHER)
Admission: RE | Admit: 2017-03-27 | Discharge: 2017-03-27 | Disposition: A | Discharge status: Home / Self Care | Payer: Medicare Other | Source: Ambulatory Visit | Attending: Adult Health | Admitting: Adult Health

## 2017-03-27 ENCOUNTER — Telehealth: Payer: Self-pay | Admitting: *Deleted

## 2017-03-27 DIAGNOSIS — J449 Chronic obstructive pulmonary disease, unspecified: Secondary | ICD-10-CM | POA: Diagnosis not present

## 2017-03-27 DIAGNOSIS — I272 Pulmonary hypertension, unspecified: Secondary | ICD-10-CM

## 2017-03-27 DIAGNOSIS — J189 Pneumonia, unspecified organism: Secondary | ICD-10-CM

## 2017-03-27 NOTE — Assessment & Plan Note (Signed)
Moderate COPD with emphysema  Frequent flares  Advised on med and O2 compliance  Check cxr  Doubt duoneb is causing her cough   Plan  Patient Instructions  Restart Duoneb Four times a day  .  Continue on Flovent 2 puffs Twice daily. , rinse after use.  Albuterol is your rescue medicine , only use As needed  For wheezing /shortness of breath .  Continue on oxygen 2l/m with activity and At bedtime   Chest xray today .  Mucinex DM Twice daily  As needed  Cough /congestion .  Follow up with Dr. Chase Caller in 2 months and As needed   Please contact office for sooner follow up if symptoms do not improve or worsen or seek emergency care  Follow up with Primary care for follow up labs from ER .

## 2017-03-27 NOTE — Telephone Encounter (Signed)
Called pt to discuss the results of her cxr. Told pt to follow up as discussed and to continue current regimens that was discussed. Pt told to call our office if she needs to follow up sooner than scheduled.  Nothing further needed at this time.

## 2017-03-27 NOTE — Assessment & Plan Note (Signed)
?  RLL PNA  Finished abx  Check cxr for clearance  If reoccurs , may need swallow eval

## 2017-03-27 NOTE — Patient Instructions (Addendum)
Restart Duoneb Four times a day  .  Continue on Flovent 2 puffs Twice daily. , rinse after use.  Albuterol is your rescue medicine , only use As needed  For wheezing /shortness of breath .  Continue on oxygen 2l/m with activity and At bedtime   Chest xray today .  Mucinex DM Twice daily  As needed  Cough /congestion .  Follow up with Dr. Chase Caller in 2 months and As needed   Please contact office for sooner follow up if symptoms do not improve or worsen or seek emergency care  Follow up with Primary care for follow up labs from ER .

## 2017-03-27 NOTE — Progress Notes (Signed)
@Patient  ID: [name], female    DOB: 1931-10-27, 83 y.o.   MRN: [id number]  Chief Complaint  Patient presents with  . Follow-up    C/o ipratropium-albuterol caused coughing,finished Amox-Clav 500 2-3 wks.ago for bronchitis,"passed out" 3-4 wks. ago.Loosing  hair a lot.Cough-clear thick,occass. wheezing.Sob- same.    Referring provider: Harlan Stains, MD  HPI: 83 yo female former smoker followed for GOLD II COPD and O2 RF on O2   TEST  May 2016 DLCO 6.1/34%, postbronchodilator FEV1 1.2 L/81% without broncho-dilator change] in the setting of moderate hiatal hernia. Alpha 1-MM 2013 Clear CT chest May 2016  03/27/2017 Follow up : COPD /PNA  Patient presents for a two-month follow-up. Patient has known COPD is currently on Flovent twice daily and DuoNeb 4 times daily.  Does have increased cough last few weeks. Coughing up clear mucus. Was wondering if duoneb was causing cough , she stopped it but still has cough . Explained that she has been on this for ~6 months and doubt this is cause of her cough .    She had been admitted in March for pneumonia. She was treated with antibiotics and follow-up chest x-ray showed resolution of pneumonia.  Patient had 2 episodes of syncope and was in the emergency room on 03/02/2017. ER notes were reviewed and indicated by the emergency room physician that syncopal episodes were felt to be related to hypoxia as patient was not wearing her oxygen. Chest x-ray at that time did show mild right basilar airspace opacity. She was given doxycycline for possible early pneumonia. (she did not take augmentin , this was from 10/2016 ) .  Lab work showed negative troponin.  Patient is supposed to be on 2 L of oxygen.. Patient now has a portable oxygen concentrator. O2 saturations were 95% on 2 L pulsing today in the office. We discussed oxygen compliance. She's had no further syncopal episodes since emergency room visit.    Allergies  Allergen Reactions  .  Amaryl [Glimepiride]     Side effects  . Avelox [Moxifloxacin Hcl In Nacl]     nausea  . Ciprofloxacin     nausea  . Ipratropium-Albuterol     cough  . Lipitor [Atorvastatin] Other (See Comments)    myalgias myalgias  . Lisinopril Other (See Comments)    cough cough  . Other Other (See Comments)    Increased cough  . Prevacid [Lansoprazole]     diarrhea  . Spiriva Handihaler [Tiotropium Bromide Monohydrate]     Increased cough  . Symbicort [Budesonide-Formoterol Fumarate]     Increased cough    Immunization History  Administered Date(s) Administered  . Influenza Split 07/19/2014  . Influenza, High Dose Seasonal PF 06/16/2016  . Influenza,inj,Quad PF,36+ Mos 06/19/2015  . Pneumococcal Conjugate-13 09/18/2013  . Pneumococcal Polysaccharide-23 05/14/2006  . Tdap 03/04/2013  . Zoster 08/20/2008, 08/18/2014    Past Medical History:  Diagnosis Date  . Anxiety   . Aortic stenosis   . Arthritis   . Asthma    uses inhaler as needed  . Depression   . Diabetes mellitus   . Emphysema of lung ([place])   . GERD (gastroesophageal reflux disease)   . Glaucoma   . Hyperlipidemia   . Hypertension   . [name]   . Murmur   . Nephrolithiasis     Tobacco History: History  Smoking Status  . Former Smoker  . Packs/day: 2.00  . Years: 15.00  . Types: Cigarettes  .  Quit date: 09/18/1972  Smokeless Tobacco  . Never Used   Counseling given: Not Answered   Outpatient Encounter Prescriptions as of 03/27/2017  Medication Sig  . acetaminophen (TYLENOL) 500 MG tablet Take 500 mg by mouth every 6 (six) hours as needed for headache.  . albuterol (PROVENTIL) (2.5 MG/3ML) 0.083% nebulizer solution Take 3 mLs (2.5 mg total) by nebulization every 6 (six) hours as needed for wheezing.  [name] amLODipine (NORVASC) 10 MG tablet Take 1 tablet (10 mg total) by mouth daily.  [name] aspirin EC 81 MG tablet Take 81 mg by mouth at bedtime.  . benzonatate (TESSALON) 100 MG capsule  Take 1 capsule (100 mg total) by mouth 3 (three) times daily as needed for cough.  . cholecalciferol (VITAMIN D) 1000 units tablet Take 1,000 Units by mouth daily.  . cyanocobalamin 500 MCG tablet Take 500 mcg by mouth at bedtime.  [name] doxycycline (VIBRAMYCIN) 100 MG capsule Take 1 capsule (100 mg total) by mouth 2 (two) times daily.  . fluticasone (FLOVENT HFA) 220 MCG/ACT inhaler Inhale 2 puffs into the lungs 2 (two) times daily.  . furosemide (LASIX) 40 MG tablet Take 1 tablet (40 mg total) by mouth every other day. (Patient taking differently: Take 20 mg by mouth daily. )  . irbesartan (AVAPRO) 150 MG tablet Take 1 tablet (150 mg total) by mouth daily.  . Magnesium 250 MG TABS Take 250 mg by mouth at bedtime.   . Multiple Vitamins-Minerals (OCUVITE-LUTEIN PO) Take 1 tablet by mouth every morning.   . pantoprazole (PROTONIX) 40 MG tablet Take 40 mg by mouth daily.  . pravastatin (PRAVACHOL) 40 MG tablet Take 40 mg by mouth every evening.   [name] PROAIR HFA 108 (90 BASE) MCG/ACT inhaler Inhale 2 puffs into the lungs 4 (four) times daily as needed for wheezing or shortness of breath.   . sertraline (ZOLOFT) 100 MG tablet Take 1 tablet (100 mg total) by mouth every morning.  [name] glimepiride (AMARYL) 4 MG tablet Take 4 mg by mouth daily with breakfast.   . ibandronate (BONIVA) 150 MG tablet Take 150 mg by mouth every 30 (thirty) days. Take in the morning with a full glass of water, on an empty stomach, and do not take anything else by mouth or lie down for the next 30 min.  [name] ipratropium-albuterol (DUONEB) 0.5-2.5 (3) MG/3ML SOLN Take 3 mLs by nebulization 4 (four) times daily. Dx: J44.9 (Patient not taking: Reported on 03/27/2017)   No facility-administered encounter medications on file as of 03/27/2017.      Review of Systems  Constitutional:   No  weight loss, night sweats,  Fevers, chills, fatigue, or  lassitude.  HEENT:   No headaches,  Difficulty swallowing,  Tooth/dental problems, or  Sore  throat,                No sneezing, itching, ear ache, + nasal congestion, post nasal drip,   CV:  No chest pain,  Orthopnea, PND, swelling in lower extremities, anasarca, dizziness, palpitations, syncope.   GI  No heartburn, indigestion, abdominal pain, nausea, vomiting, diarrhea, change in bowel habits, loss of appetite, bloody stools.   Resp:  .  No chest wall deformity  Skin: no rash or lesions.  GU: no dysuria, change in color of urine, no urgency or frequency.  No flank pain, no hematuria   MS:  No joint pain or swelling.  No decreased range of motion.  No back pain.    Physical Exam  BP (!) 142/60 (BP Location: Left Arm, Cuff Size: Normal)   Pulse 64   Ht 5' (1.524 m)   Wt 95 lb 9.6 oz (43.4 kg)   SpO2 95%   BMI 18.67 kg/m   GEN: A/Ox3; pleasant , NAD, thin , on O2 , chronically ill appearing    HEENT:  [place]/AT,  EACs-clear, TMs-wnl, NOSE-clear, THROAT-clear, no lesions, no postnasal drip or exudate noted.   NECK:  Supple w/ fair ROM; no JVD; normal carotid impulses w/o bruits; no thyromegaly or nodules palpated; no lymphadenopathy.    RESP  Few trace rhonchi w/ no accessory muscle use, no dullness to percussion  CARD:  RRR, no m/r/g, no peripheral edema, pulses intact, no cyanosis or clubbing.  GI:   Soft & nt; nml bowel sounds; no organomegaly or masses detected.   Musco: Warm bil, no deformities or joint swelling noted.   Neuro: alert, no focal deficits noted.    Skin: Warm, no lesions or rashes    Lab Results:    Imaging: Dg Chest 2 View  Result Date: 03/02/2017 CLINICAL DATA:  Status post syncope and fall, with acute onset of shortness of breath. Initial encounter. EXAM: CHEST  2 VIEW COMPARISON:  Chest radiograph performed 01/22/2017 FINDINGS: The lungs are well-aerated. Mild right basilar airspace opacity may reflect atelectasis or possibly mild infection. No significant pleural effusion or pneumothorax is seen. Peribronchial thickening is noted. The  heart is normal in size; the patient is status post median sternotomy. A moderate hiatal hernia is noted. No acute osseous abnormalities are seen. IMPRESSION: 1. Mild right basilar airspace opacity may reflect atelectasis or possibly mild infection. Peribronchial thickening noted. 2. No displaced rib fracture seen. 3. Moderate hiatal hernia noted. Electronically Signed   By: Garald Balding M.D.   On: 03/02/2017 23:55     Assessment & Plan:   No problem-specific Assessment & Plan notes found for this encounter.     Rexene Edison, NP 03/27/2017

## 2017-03-27 NOTE — Assessment & Plan Note (Signed)
Cont on O2 and lasix

## 2017-03-27 NOTE — Addendum Note (Signed)
Addended by: Parke Poisson E on: 03/27/2017 10:40 AM   Modules accepted: Orders

## 2017-03-28 ENCOUNTER — Telehealth: Payer: Self-pay | Admitting: Adult Health

## 2017-03-28 NOTE — Telephone Encounter (Signed)
Entered in error

## 2017-03-28 NOTE — Telephone Encounter (Signed)
Called pt to discuss the results of her cxr. Told pt to follow up as discussed and to continue current regimens that was discussed. Pt told to call our office if she needs to follow up sooner than scheduled.  Nothing further needed at this time.

## 2017-03-29 ENCOUNTER — Ambulatory Visit: Payer: Medicare Other | Admitting: Internal Medicine

## 2017-05-29 ENCOUNTER — Ambulatory Visit (INDEPENDENT_AMBULATORY_CARE_PROVIDER_SITE_OTHER): Payer: Medicare Other | Admitting: Internal Medicine

## 2017-05-29 ENCOUNTER — Encounter: Payer: Self-pay | Admitting: Internal Medicine

## 2017-05-29 VITALS — BP 128/80 | HR 63

## 2017-05-29 DIAGNOSIS — J189 Pneumonia, unspecified organism: Secondary | ICD-10-CM | POA: Diagnosis not present

## 2017-05-29 DIAGNOSIS — Z23 Encounter for immunization: Secondary | ICD-10-CM

## 2017-05-29 DIAGNOSIS — J9611 Chronic respiratory failure with hypoxia: Secondary | ICD-10-CM

## 2017-05-29 DIAGNOSIS — J449 Chronic obstructive pulmonary disease, unspecified: Secondary | ICD-10-CM

## 2017-05-29 NOTE — Patient Instructions (Addendum)
ICD-10-CM   1. COPD GOLD II  J44.9   2. Chronic respiratory failure with hypoxia (HCC) J96.11   3. Recurrent pneumonia J18.9     Glad you're better from recent pneumonia Glad COPD is stable  Plan and Follow-up - Continue DuoNeb 4 times daily and then Flovent inhaler 2 times daily - Continue oxygen - High dose flu shot today - Do cxr  chest without contrast in 3 months  - Return to see me in 3 months but after CXR of the chest

## 2017-05-29 NOTE — Progress Notes (Signed)
Subjective:     Patient ID: [name], female   DOB: Apr 28, 1932, 83 y.o.   MRN: [id number]  HPI     OV 05/29/2017  Chief Complaint  Patient presents with  . Follow-up    COPD Gold II. Patient states that she is feeling very weak this morning.    Chronic hypoxemic respiratory failure on oxygen 2 L associated with Gold stage II COPD and large hiatal hernia. She tells me the summer 2018 she had syncopal episodes and then after she started wearing her oxygen on a continuous basis this has not recurred. She is now on DuoNeb and [employer]. Also in spring 2018 and in 2017 she's had recurrent pneumonias. Most recent chest x-ray July 2018 seem to s show resolution but with some chronic lung changes. Currently she is feeling well. She will have high dose flu shot today    [name]; Aortic stenosis; Arthritis; Asthma; Depression; Diabetes mellitus; Emphysema of lung ([place]); GERD (gastroesophageal reflux disease); Glaucoma; Hyperlipidemia; Hypertension; [name]; Murmur; and Nephrolithiasis.   reports that she quit smoking about 44 years ago. Her smoking use included Cigarettes. She has a 30.00 pack-year smoking history. She has never used smokeless tobacco.  Past Surgical History:  Procedure Laterality Date  . ABDOMINAL HYSTERECTOMY    . ANKLE SURGERY  1998   d/t fx.  Left ankle  . ANTERIOR AND POSTERIOR REPAIR  06/21/2011   Procedure: ANTERIOR (CYSTOCELE) AND POSTERIOR REPAIR (RECTOCELE);  Surgeon: Maisie Fus;  Location: [facility];  Service: Gynecology;  Laterality: N/A;  . AORTIC VALVE REPLACEMENT N/A 07/17/2013   Procedure: AORTIC VALVE REPLACEMENT (AVR);  Surgeon: Ivin Poot, MD;  Location: [place];  Service: Open Heart Surgery;  Laterality: N/A;  . CARDIAC CATHETERIZATION    . CARDIAC VALVE REPLACEMENT    . CORONARY ANGIOPLASTY    . CORONARY ARTERY BYPASS GRAFT N/A 07/17/2013   Procedure: CORONARY ARTERY BYPASS GRAFT, TIMES ONE, ON  PUMP, USING RIGHT INTERNAL MAMMARY ARTERY.;  Surgeon: Ivin Poot, MD;  Location: [facility];  Service: Open Heart Surgery;  Laterality: N/A;  . FACIAL COSMETIC SURGERY     face lift  . INTRAOPERATIVE TRANSESOPHAGEAL ECHOCARDIOGRAM N/A 07/17/2013   Procedure: INTRAOPERATIVE TRANSESOPHAGEAL ECHOCARDIOGRAM;  Surgeon: Ivin Poot, MD;  Location: [place];  Service: Open Heart Surgery;  Laterality: N/A;  . LAPAROSCOPIC ASSISTED VAGINAL HYSTERECTOMY  06/21/2011   Procedure: LAPAROSCOPIC ASSISTED VAGINAL HYSTERECTOMY;  Surgeon: Maisie Fus;  Location: [facility];  Service: Gynecology;  Laterality: N/A;  . LEFT AND RIGHT HEART CATHETERIZATION WITH CORONARY ANGIOGRAM N/A 06/27/2013   Procedure: LEFT AND RIGHT HEART CATHETERIZATION WITH CORONARY ANGIOGRAM;  Surgeon: Ramond Dial, MD;  Location: [facility];  Service: Cardiovascular;  Laterality: N/A;  . NOSE SURGERY    . SALPINGOOPHORECTOMY  06/21/2011   Procedure: SALPINGO OOPHERECTOMY;  Surgeon: Maisie Fus;  Location: [facility];  Service: Gynecology;  Laterality: Bilateral;  . VAGINAL PROLAPSE REPAIR  06/21/2011   Procedure: SACROPEXY;  Surgeon: Maisie Fus;  Location: [facility];  Service: Gynecology;  Laterality: N/A;  sacrospinous ligament suspension    Allergies  Allergen Reactions  . Amaryl [Glimepiride]     Side effects  . Avelox [Moxifloxacin Hcl In Nacl]     nausea  . Ciprofloxacin     nausea  . Lipitor [Atorvastatin] Other (See Comments)    myalgias myalgias  . Lisinopril Other (See Comments)    cough  cough  . Other Other (See Comments)    Increased cough  . Prevacid [Lansoprazole]     diarrhea  . Spiriva Handihaler [Tiotropium Bromide Monohydrate]     Increased cough  . Symbicort [Budesonide-Formoterol Fumarate]     Increased cough    Immunization History  Administered Date(s) Administered  . Influenza Split 07/19/2014  . Influenza, High Dose Seasonal PF 06/16/2016  . Influenza,inj,Quad PF,6+ Mos 06/19/2015  .  Pneumococcal Conjugate-13 09/18/2013  . Pneumococcal Polysaccharide-23 05/14/2006  . Tdap 03/04/2013  . Zoster 08/20/2008, 08/18/2014    Family History  Problem Relation Age of Onset  . Emphysema Father   . Colon cancer Neg Hx   . Esophageal cancer Neg Hx   . Rectal cancer Neg Hx   . Stomach cancer Neg Hx      Current Outpatient Prescriptions:  .  acetaminophen (TYLENOL) 500 MG tablet, Take 500 mg by mouth every 6 (six) hours as needed for headache., Disp: , Rfl:  .  albuterol (PROVENTIL) (2.5 MG/3ML) 0.083% nebulizer solution, Take 3 mLs (2.5 mg total) by nebulization every 6 (six) hours as needed for wheezing., Disp: 75 mL, Rfl: 0 .  amLODipine (NORVASC) 10 MG tablet, Take 1 tablet (10 mg total) by mouth daily., Disp: 90 tablet, Rfl: 3 .  aspirin EC 81 MG tablet, Take 81 mg by mouth at bedtime., Disp: , Rfl:  .  benzonatate (TESSALON) 100 MG capsule, Take 1 capsule (100 mg total) by mouth 3 (three) times daily as needed for cough., Disp: 20 capsule, Rfl: 0 .  cholecalciferol (VITAMIN D) 1000 units tablet, Take 1,000 Units by mouth daily., Disp: , Rfl:  .  cyanocobalamin 500 MCG tablet, Take 500 mcg by mouth at bedtime., Disp: , Rfl:  .  doxycycline (VIBRAMYCIN) 100 MG capsule, Take 1 capsule (100 mg total) by mouth 2 (two) times daily., Disp: 14 capsule, Rfl: 0 .  fluticasone (FLOVENT HFA) 220 MCG/ACT inhaler, Inhale 2 puffs into the lungs 2 (two) times daily., Disp: , Rfl:  .  furosemide (LASIX) 40 MG tablet, Take 1 tablet (40 mg total) by mouth every other day. (Patient taking differently: Take 20 mg by mouth daily. ), Disp: 30 tablet, Rfl: 2 .  glimepiride (AMARYL) 4 MG tablet, Take 4 mg by mouth daily with breakfast. , Disp: , Rfl:  .  ibandronate (BONIVA) 150 MG tablet, Take 150 mg by mouth every 30 (thirty) days. Take in the morning with a full glass of water, on an empty stomach, and do not take anything else by mouth or lie down for the next 30 min., Disp: , Rfl:  .   ipratropium-albuterol (DUONEB) 0.5-2.5 (3) MG/3ML SOLN, Take 3 mLs by nebulization 4 (four) times daily. Dx: J44.9, Disp: 360 mL, Rfl: 5 .  irbesartan (AVAPRO) 150 MG tablet, Take 1 tablet (150 mg total) by mouth daily., Disp: 90 tablet, Rfl: 3 .  Magnesium 250 MG TABS, Take 250 mg by mouth at bedtime. , Disp: , Rfl:  .  Multiple Vitamins-Minerals (OCUVITE-LUTEIN PO), Take 1 tablet by mouth every morning. , Disp: , Rfl:  .  pantoprazole (PROTONIX) 40 MG tablet, Take 40 mg by mouth daily., Disp: , Rfl:  .  pravastatin (PRAVACHOL) 40 MG tablet, Take 40 mg by mouth every evening. , Disp: , Rfl:  .  PROAIR HFA 108 (90 BASE) MCG/ACT inhaler, Inhale 2 puffs into the lungs 4 (four) times daily as needed for wheezing or shortness of breath. , Disp: , Rfl:  .  sertraline (ZOLOFT) 100 MG tablet, Take 1 tablet (100 mg total) by mouth every morning., Disp: 30 tablet, Rfl: 0    Review of Systems     Objective:   Physical Exam Vitals:   05/29/17 0909  BP: 128/80  Pulse: 63  SpO2: 94%    Estimated body mass index is 18.67 kg/m as calculated from the following:   Height as of 03/27/17: 5' (1.524 m).   Weight as of 03/27/17: 95 lb 9.6 oz (43.4 kg).  Elderly frail female sitting in the chair. Looks well. Oxygen on. Mild baseline purse her breathing with barrel chest and diminished air entry normal heart sounds and no wheezing or crackles no accessory muscle use no edema. Skin appears intact     Assessment:       ICD-10-CM   1. COPD GOLD II  J44.9   2. Chronic respiratory failure with hypoxia (HCC) J96.11   3. Recurrent pneumonia J18.9        Plan:       Glad you're better from recent pneumonia Glad COPD is stable  Plan and Follow-up - Continue DuoNeb 4 times daily and then Flovent inhaler 2 times daily - Continue oxygen - High dose flu shot today - Do cxr  chest without contrast in 3 months  - Return to see me in 3 months but after CXR of the chest      Dr. Brand Males,  M.D., [facility].C.P Pulmonary and Critical Care Medicine Staff Physician [place] [facility] Pager: [phone], If no answer or between  15:00h - 7:00h: call [phone]  05/29/2017 9:54 AM  (> 50% of this 15 min visit spent in face to face counseling or/and coordination of care)   Dr. Brand Males, M.D., [facility].C.P Pulmonary and Critical Care Medicine Staff Physician [place] [facility] Pager: [phone], If no answer or between  15:00h - 7:00h: call [phone]  05/29/2017 9:50 AM

## 2017-06-01 ENCOUNTER — Ambulatory Visit (INDEPENDENT_AMBULATORY_CARE_PROVIDER_SITE_OTHER): Payer: Medicare Other | Admitting: Podiatry

## 2017-06-01 ENCOUNTER — Encounter: Payer: Self-pay | Admitting: Podiatry

## 2017-06-01 DIAGNOSIS — M79609 Pain in unspecified limb: Secondary | ICD-10-CM

## 2017-06-01 DIAGNOSIS — E119 Type 2 diabetes mellitus without complications: Secondary | ICD-10-CM

## 2017-06-01 DIAGNOSIS — B351 Tinea unguium: Secondary | ICD-10-CM | POA: Diagnosis not present

## 2017-06-01 NOTE — Progress Notes (Signed)
   Subjective:    Patient ID: [name], female    DOB: 1932/01/12, 83 y.o.   MRN: [id number]  HPI this patient presents to the office with chief complaint of painful toenails on both feet. He says her nails have grown thick and long, She says her nails appear to be ingrown on the second and third toes both feet. She denies any drainage from the sites. She presents the office for evaluation and treatment of her nails.  Patient is diabetic.   Review of Systems  All other systems reviewed and are negative.      Objective:   Physical Exam GENERAL APPEARANCE: Alert, conversant. Appropriately groomed. No acute distress.  VASCULAR: Pedal pulses palpable at  [facility] and PT bilateral.  Capillary refill time is immediate to all digits,  Normal temperature gradient.    NEUROLOGIC: sensation is normal to 5.07 monofilament at 5/5 sites bilateral.  Light touch is intact bilateral, Muscle strength normal.  MUSCULOSKELETAL: acceptable muscle strength, tone and stability bilateral.  HAV  B/L.  DERMATOLOGIC: skin color, texture, and turgor are within normal limits.  No preulcerative lesions or ulcers  are seen, no interdigital maceration noted.  No open lesions present.  No drainage noted.  Thick disfigured discolored ingrown toenails both feet.         Assessment & Plan:  Onychomycosis B/L   Debridement and grinding of nails both feet. RTC prn  Gardiner Barefoot DPM

## 2017-06-29 ENCOUNTER — Encounter (HOSPITAL_COMMUNITY): Payer: Self-pay | Admitting: Emergency Medicine

## 2017-06-29 ENCOUNTER — Emergency Department (HOSPITAL_COMMUNITY): Payer: Medicare Other

## 2017-06-29 ENCOUNTER — Emergency Department (HOSPITAL_COMMUNITY)
Admission: EM | Admit: 2017-06-29 | Discharge: 2017-06-29 | Disposition: A | Discharge status: Home / Self Care | Payer: Medicare Other | Attending: Emergency Medicine | Admitting: Emergency Medicine

## 2017-06-29 DIAGNOSIS — E1121 Type 2 diabetes mellitus with diabetic nephropathy: Secondary | ICD-10-CM | POA: Insufficient documentation

## 2017-06-29 DIAGNOSIS — Y9289 Other specified places as the place of occurrence of the external cause: Secondary | ICD-10-CM | POA: Insufficient documentation

## 2017-06-29 DIAGNOSIS — Z87891 Personal history of nicotine dependence: Secondary | ICD-10-CM | POA: Insufficient documentation

## 2017-06-29 DIAGNOSIS — Y9389 Activity, other specified: Secondary | ICD-10-CM | POA: Diagnosis not present

## 2017-06-29 DIAGNOSIS — J449 Chronic obstructive pulmonary disease, unspecified: Secondary | ICD-10-CM | POA: Insufficient documentation

## 2017-06-29 DIAGNOSIS — S0101XA Laceration without foreign body of scalp, initial encounter: Secondary | ICD-10-CM | POA: Diagnosis not present

## 2017-06-29 DIAGNOSIS — S0990XA Unspecified injury of head, initial encounter: Secondary | ICD-10-CM | POA: Diagnosis present

## 2017-06-29 DIAGNOSIS — Z79899 Other long term (current) drug therapy: Secondary | ICD-10-CM | POA: Diagnosis not present

## 2017-06-29 DIAGNOSIS — N183 Chronic kidney disease, stage 3 (moderate): Secondary | ICD-10-CM | POA: Insufficient documentation

## 2017-06-29 DIAGNOSIS — Y999 Unspecified external cause status: Secondary | ICD-10-CM | POA: Insufficient documentation

## 2017-06-29 DIAGNOSIS — Z7982 Long term (current) use of aspirin: Secondary | ICD-10-CM | POA: Insufficient documentation

## 2017-06-29 DIAGNOSIS — I129 Hypertensive chronic kidney disease with stage 1 through stage 4 chronic kidney disease, or unspecified chronic kidney disease: Secondary | ICD-10-CM | POA: Diagnosis not present

## 2017-06-29 DIAGNOSIS — Z7984 Long term (current) use of oral hypoglycemic drugs: Secondary | ICD-10-CM | POA: Diagnosis not present

## 2017-06-29 DIAGNOSIS — W101XXA Fall (on)(from) sidewalk curb, initial encounter: Secondary | ICD-10-CM | POA: Diagnosis not present

## 2017-06-29 DIAGNOSIS — W19XXXA Unspecified fall, initial encounter: Secondary | ICD-10-CM

## 2017-06-29 MED ORDER — MORPHINE SULFATE (PF) 4 MG/ML IV SOLN: 4.0000 mg | Freq: Once | INTRAVENOUS | Status: DC

## 2017-06-29 MED ORDER — SODIUM CHLORIDE 0.9 % IV BOLUS (SEPSIS): 1000.0000 mL | Freq: Once | INTRAVENOUS | Status: DC

## 2017-06-29 MED ORDER — ONDANSETRON 4 MG PO TBDP
4.0000 mg | ORAL_TABLET | Freq: Once | ORAL | Status: AC
  Administered 2017-06-29: 4 mg via ORAL
  Filled 2017-06-29: qty 1

## 2017-06-29 MED ORDER — OXYCODONE-ACETAMINOPHEN 5-325 MG PO TABS: 1.0000 | ORAL_TABLET | Freq: Once | ORAL | Status: DC

## 2017-06-29 MED ORDER — ACETAMINOPHEN 500 MG PO TABS
1000.0000 mg | ORAL_TABLET | Freq: Once | ORAL | Status: AC
  Administered 2017-06-29: 1000 mg via ORAL
  Filled 2017-06-29: qty 2

## 2017-06-29 NOTE — Discharge Instructions (Signed)
Your CT scan of head and cervical spine were normal.   You have a laceration to the back of your head, this was repaired with 2 staples.   You may gently clean the wound in 24 to 48 hours, pat with clean moist rag.   Wound care until the staples are removed as follows: Apply antibiotic ointment daily to the wound. Apply a dressing to the wound, unless it was originally left open. Do not soak the wound (eg, swimming, bathing), although showering is acceptable Follow up with primary care provider or urgent care for staple removal in 7-10 days Monitor for signs of infection including increased redness, swelling, pain, warmth, yellow discharge, fevers Take tylenol for pain

## 2017-06-29 NOTE — ED Provider Notes (Signed)
[facility] DEPT Provider Note   CSN: [id number] Arrival date & time: 06/29/17  1032     History   Chief Complaint Chief Complaint  Patient presents with  . Fall  . Head Injury    HPI [name] is a 83 y.o. female with h/o HTN, DM, HLD, chronic respiratory failure on chronic 2L oxygen via [place] secondary to COPD presents for evaluation of laceration to posterior head after sustaining fall PTA. Pt missed side walk step and fell backwards striking the back of her head on cement. She endorses mild posterior head pain and neck pain. Denies visual changes, nausea, vomiting, syncope, dizziness, weakness or numbness to extremities, saddle anesthesia or any other symptoms. Denies preceding light-headedness, CP, SOB before fall. No anticoagulants. Confirms she was wearing her oxygen during fall. Feels otherwise at baseline.   HPI  Past Medical History:  Diagnosis Date  . Anxiety   . Aortic stenosis   . Arthritis   . Asthma    uses inhaler as needed  . Depression   . Diabetes mellitus   . Emphysema of lung ([place])   . GERD (gastroesophageal reflux disease)   . Glaucoma   . Hyperlipidemia   . Hypertension   . [name]   . Murmur   . Nephrolithiasis     Patient Active Problem List   Diagnosis Date Noted  . Anemia, chronic disease 12/07/2016  . Chronic obstructive pulmonary disease ([place])   . Chronic respiratory failure ([place]) 11/19/2016  . CKD (chronic kidney disease), stage III ([place]) 11/19/2016  . Diabetes mellitus type 2, uncontrolled ([place]) 11/19/2016  . Recurrent pneumonia 11/19/2016  . Acute on chronic diastolic heart failure ([place]) 11/19/2016  . Pulmonary HTN ([place]) 11/19/2016  . HLD (hyperlipidemia) 11/19/2016  . Hiatal hernia 05/01/2016  . History of bacterial pneumonia 05/01/2016  . COPD exacerbation ([place]) 11/18/2015  . Physical deconditioning 07/24/2013  . S/P AVR (aortic valve replacement) 07/23/2013  . S/P CABG x 1 07/23/2013  . Aortic  stenosis 06/10/2013  . Essential hypertension 06/10/2013  . COPD GOLD II  10/26/2011    Past Surgical History:  Procedure Laterality Date  . ABDOMINAL HYSTERECTOMY    . ANKLE SURGERY  1998   d/t fx.  Left ankle  . ANTERIOR AND POSTERIOR REPAIR  06/21/2011   Procedure: ANTERIOR (CYSTOCELE) AND POSTERIOR REPAIR (RECTOCELE);  Surgeon: Maisie Fus;  Location: [facility];  Service: Gynecology;  Laterality: N/A;  . AORTIC VALVE REPLACEMENT N/A 07/17/2013   Procedure: AORTIC VALVE REPLACEMENT (AVR);  Surgeon: Ivin Poot, MD;  Location: [facility];  Service: Open Heart Surgery;  Laterality: N/A;  . CARDIAC CATHETERIZATION    . CARDIAC VALVE REPLACEMENT    . CORONARY ANGIOPLASTY    . CORONARY ARTERY BYPASS GRAFT N/A 07/17/2013   Procedure: CORONARY ARTERY BYPASS GRAFT, TIMES ONE, ON PUMP, USING RIGHT INTERNAL MAMMARY ARTERY.;  Surgeon: Ivin Poot, MD;  Location: [facility];  Service: Open Heart Surgery;  Laterality: N/A;  . FACIAL COSMETIC SURGERY     face lift  . INTRAOPERATIVE TRANSESOPHAGEAL ECHOCARDIOGRAM N/A 07/17/2013   Procedure: INTRAOPERATIVE TRANSESOPHAGEAL ECHOCARDIOGRAM;  Surgeon: Ivin Poot, MD;  Location: [facility];  Service: Open Heart Surgery;  Laterality: N/A;  . LAPAROSCOPIC ASSISTED VAGINAL HYSTERECTOMY  06/21/2011   Procedure: LAPAROSCOPIC ASSISTED VAGINAL HYSTERECTOMY;  Surgeon: Maisie Fus;  Location: [facility];  Service: Gynecology;  Laterality: N/A;  . LEFT AND RIGHT HEART CATHETERIZATION WITH CORONARY ANGIOGRAM N/A 06/27/2013   Procedure:  LEFT AND RIGHT HEART CATHETERIZATION WITH CORONARY ANGIOGRAM;  Surgeon: Ramond Dial, MD;  Location: [facility] C[facility];  Service: Cardiovascular;  Laterality: N/A;  . NOSE SURGERY    . SALPINGOOPHORECTOMY  06/21/2011   Procedure: SALPINGO OOPHERECTOMY;  Surgeon: Maisie Fus;  Location: [facility];  Service: Gynecology;  Laterality: Bilateral;  . VAGINAL PROLAPSE REPAIR  06/21/2011   Procedure: SACROPEXY;  Surgeon: Maisie Fus;  Location: [facility];  Service: Gynecology;  Laterality: N/A;  sacrospinous ligament suspension    OB History    No data available       Home Medications    Prior to Admission medications   Medication Sig Start Date End Date Taking? Authorizing Provider  albuterol (PROVENTIL) (2.5 MG/3ML) 0.083% nebulizer solution Take 3 mLs (2.5 mg total) by nebulization every 6 (six) hours as needed for wheezing. 11/23/16  Yes Eugenie Filler, MD  amLODipine (NORVASC) 10 MG tablet Take 1 tablet (10 mg total) by mouth daily. 10/19/15  Yes Lelon Perla, MD  aspirin EC 81 MG tablet Take 81 mg by mouth at bedtime.   Yes [provider]  buPROPion (WELLBUTRIN XL) 150 MG 24 hr tablet Take 150 mg by mouth daily. 06/04/17  Yes [provider]  cholecalciferol (VITAMIN D) 1000 units tablet Take 1,000 Units by mouth at bedtime.    Yes [provider]  FERREX 150 150 MG capsule Take 150 mg by mouth daily. 06/05/17  Yes [provider]  fluticasone (FLOVENT HFA) 220 MCG/ACT inhaler Inhale 2 puffs into the lungs 2 (two) times daily.   Yes [provider]  furosemide (LASIX) 40 MG tablet Take 1 tablet (40 mg total) by mouth every other day. Patient taking differently: Take 20 mg by mouth every other day.  11/23/16  Yes Eugenie Filler, MD  glimepiride (AMARYL) 4 MG tablet Take 4 mg by mouth daily with breakfast.    Yes [provider]  irbesartan (AVAPRO) 150 MG tablet Take 1 tablet (150 mg total) by mouth daily. 10/12/14  Yes Lelon Perla, MD  Magnesium 250 MG TABS Take 250 mg by mouth at bedtime.    Yes [provider]  Multiple Vitamins-Minerals (OCUVITE-LUTEIN PO) Take 1 tablet by mouth every morning.    Yes [provider]  pravastatin (PRAVACHOL) 40 MG tablet Take 40 mg by mouth every evening.    Yes [provider]  sertraline (ZOLOFT) 100 MG tablet Take 1 tablet (100 mg total) by mouth every morning. 11/24/16  Yes Eugenie Filler, MD    acetaminophen (TYLENOL) 500 MG tablet Take 500 mg by mouth every 6 (six) hours as needed for headache.    [provider]  benzonatate (TESSALON) 100 MG capsule Take 1 capsule (100 mg total) by mouth 3 (three) times daily as needed for cough. Patient not taking: Reported on 06/29/2017 11/23/16   Eugenie Filler, MD  ipratropium-albuterol (DUONEB) 0.5-2.5 (3) MG/3ML SOLN Take 3 mLs by nebulization 4 (four) times daily. Dx: J44.9 01/01/17   Parrett, Fonnie Mu, NP  PROAIR HFA 108 (90 BASE) MCG/ACT inhaler Inhale 2 puffs into the lungs 4 (four) times daily as needed for wheezing or shortness of breath.  02/24/14   [provider]    Family History Family History  Problem Relation Age of Onset  . Emphysema Father   . Colon cancer Neg Hx   . Esophageal cancer Neg Hx   . Rectal cancer Neg Hx   .  Stomach cancer Neg Hx     Social History Social History  Substance Use Topics  . Smoking status: Former Smoker    Packs/day: 2.00    Years: 15.00    Types: Cigarettes    Quit date: 09/18/1972  . Smokeless tobacco: Never Used  . Alcohol use No     Allergies   Amaryl [glimepiride]; Avelox [moxifloxacin hcl in nacl]; Ciprofloxacin; Lipitor [atorvastatin]; Lisinopril; Other; Prevacid [lansoprazole]; Spiriva handihaler [tiotropium bromide monohydrate]; and Symbicort [budesonide-formoterol fumarate]   Review of Systems Review of Systems  HENT: Negative for nosebleeds.   Eyes: Negative for visual disturbance.  Respiratory: Negative for shortness of breath.   Cardiovascular: Negative for chest pain.  Gastrointestinal: Negative for nausea and vomiting.  Genitourinary: Negative for difficulty urinating.  Musculoskeletal: Positive for myalgias and neck pain. Negative for back pain and neck stiffness.  Skin: Positive for wound.  Neurological: Positive for headaches. Negative for dizziness, syncope, weakness, light-headedness and numbness.  Hematological: Does not bruise/bleed easily.      Physical Exam Updated Vital Signs BP (!) 170/74   Pulse 60   Temp 97.9 F (36.6 C) (Oral)   Resp 17   SpO2 100%   Physical Exam  Constitutional: She is oriented to person, place, and time. She appears well-developed and well-nourished. No distress.  NAD. Has bandage around head.  HENT:  Head: Normocephalic.  Nose: Nose normal.  Mouth/Throat: Oropharynx is clear and moist. No oropharyngeal exudate.  +Mild posterior local tenderness with small hematoma to occipital scalp No intranasal or intraoral signs of bleeding or injury  Eyes: Conjunctivae are normal.  Neck: Normal range of motion. Neck supple. Spinous process tenderness and muscular tenderness present.  +Mild midline C-spine and paraspinal muscle tenderness No cervical collar in place  Cardiovascular: Normal rate, regular rhythm, normal heart sounds and intact distal pulses.   No murmur heard. Pulmonary/Chest: Effort normal and breath sounds normal. No respiratory distress. She has no wheezes. She has no rales. She exhibits no tenderness.  Abdominal: Soft. Bowel sounds are normal. There is no tenderness.  Musculoskeletal: Normal range of motion. She exhibits no deformity.  Full PROM of upper and lower extremities without pain Pelvis stable. No leg rotation or shortening.   Lymphadenopathy:    She has no cervical adenopathy.  Neurological: She is alert and oriented to person, place, and time. No sensory deficit.  A&O to self, place and time. Speech and phonation normal.  Strength 5/5 with hand grip and ankle flexion/extension.   Sensation to light touch intact in hands and feet. No truncal sway.  No leg drift.  Intact finger to nose test. CN I not tested CN II full visual fields bilaterally CN III, IV, VI PEERL and EOMs intact bilaterally CN V light touch intact in all 3 divisions of trigeminal nerve CN VII facial nerve movements intact, symmetric, bilaterally CN VIII hearing intact to finger rub, bilaterally CN  IX, X no uvula deviation, symmetric soft palate rise CN XI 5/5 SCM and trapezius strength bilaterally  CN XII Tongue midline with symmetric L/R movement  Skin: Skin is warm and dry. Capillary refill takes less than 2 seconds. Laceration noted.  +Dried up blood to posterior of head, unable to visualize laceration. Will reassess. No ecchymosis to back or buttocks  Psychiatric: She has a normal mood and affect. Her behavior is normal. Judgment and thought content normal.  Nursing note and vitals reviewed.    ED Treatments / Results  Labs (all labs ordered are listed, but  only abnormal results are displayed) Labs Reviewed - No data to display  EKG  EKG Interpretation None       Radiology Ct Head Wo Contrast  Result Date: 06/29/2017 CLINICAL DATA:  Head laceration after fall today. EXAM: CT HEAD WITHOUT CONTRAST CT CERVICAL SPINE WITHOUT CONTRAST TECHNIQUE: Multidetector CT imaging of the head and cervical spine was performed following the standard protocol without intravenous contrast. Multiplanar CT image reconstructions of the cervical spine were also generated. COMPARISON:  CT scan of July 19, 2013. FINDINGS: CT HEAD FINDINGS Brain: Mild diffuse cortical atrophy is noted. Mild chronic ischemic white matter disease is noted. No mass effect or midline shift is noted. Ventricular size is within normal limits. There is no evidence of mass lesion, hemorrhage or acute infarction. Vascular: No hyperdense vessel or unexpected calcification. Skull: Normal. Negative for fracture or focal lesion. Sinuses/Orbits: Minimal fluid is noted in left maxillary sinus. Other: None. CT CERVICAL SPINE FINDINGS Alignment: Minimal grade 1 retrolisthesis of C4-5 is noted. Skull base and vertebrae: No acute fracture. No primary bone lesion or focal pathologic process. Soft tissues and spinal canal: Atherosclerotic calcifications of carotid arteries are noted. Disc levels: Moderate degenerative disc disease is  noted at C3-4, C4-5 C5-6 and C6-7. Upper chest: Emphysematous disease is noted in both upper lobes. Other: None. IMPRESSION: Mild diffuse cortical atrophy. Mild chronic ischemic white matter disease. No acute intracranial abnormality seen. Multilevel degenerative disc disease is noted in the cervical spine. No fracture or other acute abnormality is noted. Emphysematous disease is noted in the upper lungs bilaterally. Electronically Signed   By: Marijo Conception, M.D.   On: 06/29/2017 12:16   Ct Cervical Spine Wo Contrast  Result Date: 06/29/2017 CLINICAL DATA:  Head laceration after fall today. EXAM: CT HEAD WITHOUT CONTRAST CT CERVICAL SPINE WITHOUT CONTRAST TECHNIQUE: Multidetector CT imaging of the head and cervical spine was performed following the standard protocol without intravenous contrast. Multiplanar CT image reconstructions of the cervical spine were also generated. COMPARISON:  CT scan of July 19, 2013. FINDINGS: CT HEAD FINDINGS Brain: Mild diffuse cortical atrophy is noted. Mild chronic ischemic white matter disease is noted. No mass effect or midline shift is noted. Ventricular size is within normal limits. There is no evidence of mass lesion, hemorrhage or acute infarction. Vascular: No hyperdense vessel or unexpected calcification. Skull: Normal. Negative for fracture or focal lesion. Sinuses/Orbits: Minimal fluid is noted in left maxillary sinus. Other: None. CT CERVICAL SPINE FINDINGS Alignment: Minimal grade 1 retrolisthesis of C4-5 is noted. Skull base and vertebrae: No acute fracture. No primary bone lesion or focal pathologic process. Soft tissues and spinal canal: Atherosclerotic calcifications of carotid arteries are noted. Disc levels: Moderate degenerative disc disease is noted at C3-4, C4-5 C5-6 and C6-7. Upper chest: Emphysematous disease is noted in both upper lobes. Other: None. IMPRESSION: Mild diffuse cortical atrophy. Mild chronic ischemic white matter disease. No acute  intracranial abnormality seen. Multilevel degenerative disc disease is noted in the cervical spine. No fracture or other acute abnormality is noted. Emphysematous disease is noted in the upper lungs bilaterally. Electronically Signed   By: Marijo Conception, M.D.   On: 06/29/2017 12:16    Procedures .Marland KitchenLaceration Repair Date/Time: 06/29/2017 1:59 PM Performed by: Kinnie Feil Authorized by: Kinnie Feil   Consent:    Consent obtained:  Verbal   Consent given by:  Patient   Risks discussed:  Infection, pain, poor cosmetic result and poor wound healing   Alternatives  discussed:  Delayed treatment Anesthesia (see MAR for exact dosages):    Anesthesia method:  None Laceration details:    Location:  Scalp   Scalp location:  Occipital   Length (cm):  2 Repair type:    Repair type:  Simple Pre-procedure details:    Preparation:  Patient was prepped and draped in usual sterile fashion Exploration:    Hemostasis achieved with:  Direct pressure   Wound exploration: entire depth of wound probed and visualized     Contaminated: no   Treatment:    Area cleansed with:  Betadine and saline   Amount of cleaning:  Standard   Irrigation solution:  Sterile water   Irrigation method:  Pressure wash and tap   Visualized foreign bodies/material removed: no   Skin repair:    Repair method:  Staples   Number of staples:  2 Approximation:    Approximation:  Close   [place] border: well-aligned   Post-procedure details:    Dressing:  Open (no dressing)   Patient tolerance of procedure:  Tolerated well, no immediate complications   (including critical care time)  Medications Ordered in ED Medications  ondansetron (ZOFRAN-ODT) disintegrating tablet 4 mg (4 mg Oral Given 06/29/17 1216)  acetaminophen (TYLENOL) tablet 1,000 mg (1,000 mg Oral Given 06/29/17 1233)     Initial Impression / Assessment and Plan / ED Course  I have reviewed the triage vital signs and the nursing  notes.  Pertinent labs & imaging results that were available during my care of the patient were reviewed by me and considered in my medical decision making (see chart for details).    83 yo female with occipital scalp laceration s/p mechanical fall PTA. No LOC or anticoagulants. No prodromal symptoms to raise suspicion for non mechanical fall.   On exam, she is neuro intact. Thorough skin and MSK exam benign, other than laceration noted as above. This was repaired with 2 staples.   CT scan head/cervical spine negative. Discussed results with pt. She will be d/c with wound care and f/u for staple removal with PCP. Discussed s/s that would warrant return to ED. [name]  Final Clinical Impressions(s) / ED Diagnoses   Final diagnoses:  Laceration of scalp, initial encounter  Fall, initial encounter    New Prescriptions New Prescriptions   No medications on file     Arlean Hopping 06/29/17 1402    Nat Christen, MD 06/30/17 [phone]

## 2017-06-29 NOTE — ED Notes (Signed)
Applied bacitracin and ambulatory to wheelchair steady with assistance.

## 2017-06-29 NOTE — ED Triage Notes (Signed)
Pt reports losing balance getting out of car today, reports falling back and j=hitting head. Lac noted to posterior scalp.  Pt denies dizzines, SOB, CP before fall, reports dizziness after fall.  Pt denies dizziness at this time, reports frequent falls.

## 2017-06-29 NOTE — ED Notes (Signed)
Pt denies taking blood thinners, no neuro deficits noted at this time.

## 2017-07-06 ENCOUNTER — Encounter (HOSPITAL_COMMUNITY): Payer: Self-pay

## 2017-07-06 DIAGNOSIS — Z7984 Long term (current) use of oral hypoglycemic drugs: Secondary | ICD-10-CM | POA: Insufficient documentation

## 2017-07-06 DIAGNOSIS — I5032 Chronic diastolic (congestive) heart failure: Secondary | ICD-10-CM | POA: Diagnosis not present

## 2017-07-06 DIAGNOSIS — S0001XA Abrasion of scalp, initial encounter: Secondary | ICD-10-CM | POA: Diagnosis not present

## 2017-07-06 DIAGNOSIS — R296 Repeated falls: Secondary | ICD-10-CM | POA: Insufficient documentation

## 2017-07-06 DIAGNOSIS — Y999 Unspecified external cause status: Secondary | ICD-10-CM | POA: Insufficient documentation

## 2017-07-06 DIAGNOSIS — Z87891 Personal history of nicotine dependence: Secondary | ICD-10-CM | POA: Diagnosis not present

## 2017-07-06 DIAGNOSIS — J449 Chronic obstructive pulmonary disease, unspecified: Secondary | ICD-10-CM | POA: Insufficient documentation

## 2017-07-06 DIAGNOSIS — I130 Hypertensive heart and chronic kidney disease with heart failure and stage 1 through stage 4 chronic kidney disease, or unspecified chronic kidney disease: Secondary | ICD-10-CM | POA: Insufficient documentation

## 2017-07-06 DIAGNOSIS — S0990XA Unspecified injury of head, initial encounter: Secondary | ICD-10-CM | POA: Diagnosis present

## 2017-07-06 DIAGNOSIS — Z79899 Other long term (current) drug therapy: Secondary | ICD-10-CM | POA: Diagnosis not present

## 2017-07-06 DIAGNOSIS — N183 Chronic kidney disease, stage 3 (moderate): Secondary | ICD-10-CM | POA: Insufficient documentation

## 2017-07-06 DIAGNOSIS — W1830XA Fall on same level, unspecified, initial encounter: Secondary | ICD-10-CM | POA: Diagnosis not present

## 2017-07-06 DIAGNOSIS — Y929 Unspecified place or not applicable: Secondary | ICD-10-CM | POA: Insufficient documentation

## 2017-07-06 DIAGNOSIS — Y939 Activity, unspecified: Secondary | ICD-10-CM | POA: Insufficient documentation

## 2017-07-06 DIAGNOSIS — E1122 Type 2 diabetes mellitus with diabetic chronic kidney disease: Secondary | ICD-10-CM | POA: Diagnosis not present

## 2017-07-06 DIAGNOSIS — Z7982 Long term (current) use of aspirin: Secondary | ICD-10-CM | POA: Diagnosis not present

## 2017-07-06 NOTE — ED Triage Notes (Signed)
Pt arrives from home with spouse; pt just had fall last week hitting head and required staples; pt had staples removed yesterday; pt had the same type fall today stepping on curb falling back; pt states chronic balance issues and need to use a cane; pt is a&ox 4 on arrival. Pt has small lac to back of head, bleeding has stopped after assessment; pt states pt states hx of dizziness and take meds for it but denies dx of vertigo; Pt states HA at 5/10 on arrival and denies any other sx or injuries-Monique,RN

## 2017-07-07 ENCOUNTER — Emergency Department (HOSPITAL_COMMUNITY)
Admission: EM | Admit: 2017-07-07 | Discharge: 2017-07-07 | Disposition: A | Discharge status: Home / Self Care | Payer: Medicare Other | Attending: Emergency Medicine | Admitting: Emergency Medicine

## 2017-07-07 ENCOUNTER — Emergency Department (HOSPITAL_COMMUNITY): Payer: Medicare Other

## 2017-07-07 DIAGNOSIS — S0001XA Abrasion of scalp, initial encounter: Secondary | ICD-10-CM

## 2017-07-07 DIAGNOSIS — W19XXXA Unspecified fall, initial encounter: Secondary | ICD-10-CM

## 2017-07-07 NOTE — ED Provider Notes (Signed)
[facility] EMERGENCY DEPARTMENT Provider Note   CSN: [id number] Arrival date & time: 07/06/17  1925     History   Chief Complaint Chief Complaint  Patient presents with  . Fall    HPI [name] is a 83 y.o. female.  The history is provided by the patient.  She fell today striking the back of her head on the ground and causing a laceration.  She just lost her balance.  She had an another fall earlier in the day and had suffered a scratch to her right forearm.  She had a fall 1 week ago which caused a scalp laceration and staples had just been removed yesterday.  She denies loss of consciousness.  She denies vision changes, nausea, vomiting, weakness.  Past Medical History:  Diagnosis Date  . Anxiety   . Aortic stenosis   . Arthritis   . Asthma    uses inhaler as needed  . Depression   . Diabetes mellitus   . Emphysema of lung ([place])   . GERD (gastroesophageal reflux disease)   . Glaucoma   . Hyperlipidemia   . Hypertension   . [name]   . Murmur   . Nephrolithiasis     Patient Active Problem List   Diagnosis Date Noted  . Anemia, chronic disease 12/07/2016  . Chronic obstructive pulmonary disease ([place])   . Chronic respiratory failure ([place]) 11/19/2016  . CKD (chronic kidney disease), stage III ([place]) 11/19/2016  . Diabetes mellitus type 2, uncontrolled ([place]) 11/19/2016  . Recurrent pneumonia 11/19/2016  . Acute on chronic diastolic heart failure ([place]) 11/19/2016  . Pulmonary HTN ([place]) 11/19/2016  . HLD (hyperlipidemia) 11/19/2016  . Hiatal hernia 05/01/2016  . History of bacterial pneumonia 05/01/2016  . COPD exacerbation ([place]) 11/18/2015  . Physical deconditioning 07/24/2013  . S/P AVR (aortic valve replacement) 07/23/2013  . S/P CABG x 1 07/23/2013  . Aortic stenosis 06/10/2013  . Essential hypertension 06/10/2013  . COPD GOLD II  10/26/2011    Past Surgical History:  Procedure Laterality Date  . ABDOMINAL  HYSTERECTOMY    . ANKLE SURGERY  1998   d/t fx.  Left ankle  . ANTERIOR AND POSTERIOR REPAIR  06/21/2011   Procedure: ANTERIOR (CYSTOCELE) AND POSTERIOR REPAIR (RECTOCELE);  Surgeon: Maisie Fus;  Location: [facility];  Service: Gynecology;  Laterality: N/A;  . AORTIC VALVE REPLACEMENT N/A 07/17/2013   Procedure: AORTIC VALVE REPLACEMENT (AVR);  Surgeon: Ivin Poot, MD;  Location: [facility];  Service: Open Heart Surgery;  Laterality: N/A;  . CARDIAC CATHETERIZATION    . CARDIAC VALVE REPLACEMENT    . CORONARY ANGIOPLASTY    . CORONARY ARTERY BYPASS GRAFT N/A 07/17/2013   Procedure: CORONARY ARTERY BYPASS GRAFT, TIMES ONE, ON PUMP, USING RIGHT INTERNAL MAMMARY ARTERY.;  Surgeon: Ivin Poot, MD;  Location: [facility];  Service: Open Heart Surgery;  Laterality: N/A;  . FACIAL COSMETIC SURGERY     face lift  . INTRAOPERATIVE TRANSESOPHAGEAL ECHOCARDIOGRAM N/A 07/17/2013   Procedure: INTRAOPERATIVE TRANSESOPHAGEAL ECHOCARDIOGRAM;  Surgeon: Ivin Poot, MD;  Location: [facility];  Service: Open Heart Surgery;  Laterality: N/A;  . LAPAROSCOPIC ASSISTED VAGINAL HYSTERECTOMY  06/21/2011   Procedure: LAPAROSCOPIC ASSISTED VAGINAL HYSTERECTOMY;  Surgeon: Maisie Fus;  Location: [facility];  Service: Gynecology;  Laterality: N/A;  . LEFT AND RIGHT HEART CATHETERIZATION WITH CORONARY ANGIOGRAM N/A 06/27/2013   Procedure: LEFT AND RIGHT HEART CATHETERIZATION WITH CORONARY ANGIOGRAM;  Surgeon: Milus Banister  Deboraha Sprang, MD;  Location: [facility];  Service: Cardiovascular;  Laterality: N/A;  . NOSE SURGERY    . SALPINGOOPHORECTOMY  06/21/2011   Procedure: SALPINGO OOPHERECTOMY;  Surgeon: Maisie Fus;  Location: [facility];  Service: Gynecology;  Laterality: Bilateral;  . VAGINAL PROLAPSE REPAIR  06/21/2011   Procedure: SACROPEXY;  Surgeon: Maisie Fus;  Location: [facility];  Service: Gynecology;  Laterality: N/A;  sacrospinous ligament suspension    OB History    No data available       Home Medications    Prior  to Admission medications   Medication Sig Start Date End Date Taking? Authorizing Provider  acetaminophen (TYLENOL) 500 MG tablet Take 500 mg by mouth every 6 (six) hours as needed for headache.    [provider]  albuterol (PROVENTIL) (2.5 MG/3ML) 0.083% nebulizer solution Take 3 mLs (2.5 mg total) by nebulization every 6 (six) hours as needed for wheezing. 11/23/16   Eugenie Filler, MD  amLODipine (NORVASC) 10 MG tablet Take 1 tablet (10 mg total) by mouth daily. 10/19/15   Lelon Perla, MD  aspirin EC 81 MG tablet Take 81 mg by mouth at bedtime.    [provider]  benzonatate (TESSALON) 100 MG capsule Take 1 capsule (100 mg total) by mouth 3 (three) times daily as needed for cough. Patient not taking: Reported on 06/29/2017 11/23/16   Eugenie Filler, MD  buPROPion (WELLBUTRIN XL) 150 MG 24 hr tablet Take 150 mg by mouth daily. 06/04/17   [provider]  cholecalciferol (VITAMIN D) 1000 units tablet Take 1,000 Units by mouth at bedtime.     [provider]  FERREX 150 150 MG capsule Take 150 mg by mouth daily. 06/05/17   [provider]  fluticasone (FLOVENT HFA) 220 MCG/ACT inhaler Inhale 2 puffs into the lungs 2 (two) times daily.    [provider]  furosemide (LASIX) 40 MG tablet Take 1 tablet (40 mg total) by mouth every other day. Patient taking differently: Take 20 mg by mouth every other day.  11/23/16   Eugenie Filler, MD  glimepiride (AMARYL) 4 MG tablet Take 4 mg by mouth daily with breakfast.     [provider]  ipratropium-albuterol (DUONEB) 0.5-2.5 (3) MG/3ML SOLN Take 3 mLs by nebulization 4 (four) times daily. Dx: J44.9 01/01/17   Parrett, Fonnie Mu, NP  irbesartan (AVAPRO) 150 MG tablet Take 1 tablet (150 mg total) by mouth daily. 10/12/14   Lelon Perla, MD  Magnesium 250 MG TABS Take 250 mg by mouth at bedtime.     [provider]  Multiple Vitamins-Minerals (OCUVITE-LUTEIN PO) Take 1 tablet by  mouth every morning.     [provider]  pravastatin (PRAVACHOL) 40 MG tablet Take 40 mg by mouth every evening.     [provider]  PROAIR HFA 108 (90 BASE) MCG/ACT inhaler Inhale 2 puffs into the lungs 4 (four) times daily as needed for wheezing or shortness of breath.  02/24/14   [provider]  sertraline (ZOLOFT) 100 MG tablet Take 1 tablet (100 mg total) by mouth every morning. 11/24/16   Eugenie Filler, MD    Family History Family History  Problem Relation Age of Onset  . Emphysema Father   . Colon cancer Neg Hx   . Esophageal cancer Neg Hx   . Rectal cancer Neg Hx   . Stomach cancer Neg Hx     Social History Social History  Substance Use Topics  . Smoking status: Former Smoker    Packs/day: 2.00    Years: 15.00    Types: Cigarettes    Quit date: 09/18/1972  . Smokeless tobacco: Never Used  . Alcohol use No     Allergies   Amaryl [glimepiride]; Avelox [moxifloxacin hcl in nacl]; Ciprofloxacin; Lipitor [atorvastatin]; Lisinopril; Other; Prevacid [lansoprazole]; Spiriva handihaler [tiotropium bromide monohydrate]; and Symbicort [budesonide-formoterol fumarate]   Review of Systems Review of Systems  All other systems reviewed and are negative.    Physical Exam Updated Vital Signs BP (!) 160/58   Pulse (!) 59   Temp (!) 97.4 F (36.3 C) (Oral)   Resp 20   SpO2 100%   Physical Exam  Nursing note and vitals reviewed.  83 year old female, resting comfortably and in no acute distress. Vital signs are significant for hypertension. Oxygen saturation is 100%, which is normal. Head is normocephalic.  Abrasion is present on the left side of the occiput -no discrete laceration. PERRLA, EOMI. Oropharynx is clear. Neck is nontender and supple without adenopathy or JVD. Back is nontender and there is no CVA tenderness. Lungs are clear without rales, wheezes, or rhonchi. Chest is nontender. Heart has regular rate and rhythm without  murmur. Abdomen is soft, flat, nontender without masses or hepatosplenomegaly and peristalsis is normoactive. Extremities have no cyanosis or edema, full range of motion is present.  Superficial laceration is present on the right forearm-not requiring any closure Skin is warm and dry without rash. Neurologic: Mental status is normal, cranial nerves are intact, there are no motor or sensory deficits.  ED Treatments / Results   Radiology Ct Head Wo Contrast  Result Date: 07/07/2017 CLINICAL DATA:  [place] this evening. Laceration to the back of the head. EXAM: CT HEAD WITHOUT CONTRAST CT CERVICAL SPINE WITHOUT CONTRAST TECHNIQUE: Multidetector CT imaging of the head and cervical spine was performed following the standard protocol without intravenous contrast. Multiplanar CT image reconstructions of the cervical spine were also generated. COMPARISON:  06/29/2017 FINDINGS: CT HEAD FINDINGS Brain: Diffuse cerebral atrophy. Mild ventricular dilatation consistent with central atrophy. Patchy low-attenuation changes throughout the deep white matter consistent with small vessel ischemia. No change since previous study. No mass effect or midline shift. No abnormal extra-axial fluid collections. Gray-white matter junctions are distinct. Basal cisterns are not effaced. No acute intracranial hemorrhage. Vascular: Internal carotid artery and vertebrobasilar calcifications. Skull: Calvarium appears intact. Sinuses/Orbits: Mild mucosal thickening in the paranasal sinuses. No acute air-fluid levels. Mastoid air cells are not opacified. Other: None. CT CERVICAL SPINE FINDINGS Alignment: Normal alignment of the cervical vertebrae and facet joints. C1-2 articulation appears intact. Skull base and vertebrae: No acute fracture. No primary bone lesion or focal pathologic process. Soft tissues and spinal canal: No prevertebral fluid or swelling. No visible canal hematoma. Disc levels: Diffuse degenerative change throughout the  cervical spine with narrowed interspaces and endplate hypertrophic changes. Degenerative changes are most prominent at C3-4, C4-5, and C5-6 levels. Degenerative changes throughout the cervical facet joints. Upper chest: Emphysematous changes in the lung apices. Other: Vascular calcifications. IMPRESSION: 1. No acute intracranial abnormalities. Chronic atrophy and small vessel ischemic changes. 2. Normal alignment of the cervical spine. Diffuse degenerative changes. No acute displaced fractures identified. Electronically Signed   By: Lucienne Capers M.D.   On: 07/07/2017 03:15   Ct Cervical Spine Wo Contrast  Result Date: 07/07/2017 CLINICAL DATA:  [place] this evening. Laceration to the back of the head. EXAM: CT HEAD WITHOUT CONTRAST  CT CERVICAL SPINE WITHOUT CONTRAST TECHNIQUE: Multidetector CT imaging of the head and cervical spine was performed following the standard protocol without intravenous contrast. Multiplanar CT image reconstructions of the cervical spine were also generated. COMPARISON:  06/29/2017 FINDINGS: CT HEAD FINDINGS Brain: Diffuse cerebral atrophy. Mild ventricular dilatation consistent with central atrophy. Patchy low-attenuation changes throughout the deep white matter consistent with small vessel ischemia. No change since previous study. No mass effect or midline shift. No abnormal extra-axial fluid collections. Gray-white matter junctions are distinct. Basal cisterns are not effaced. No acute intracranial hemorrhage. Vascular: Internal carotid artery and vertebrobasilar calcifications. Skull: Calvarium appears intact. Sinuses/Orbits: Mild mucosal thickening in the paranasal sinuses. No acute air-fluid levels. Mastoid air cells are not opacified. Other: None. CT CERVICAL SPINE FINDINGS Alignment: Normal alignment of the cervical vertebrae and facet joints. C1-2 articulation appears intact. Skull base and vertebrae: No acute fracture. No primary bone lesion or focal pathologic process.  Soft tissues and spinal canal: No prevertebral fluid or swelling. No visible canal hematoma. Disc levels: Diffuse degenerative change throughout the cervical spine with narrowed interspaces and endplate hypertrophic changes. Degenerative changes are most prominent at C3-4, C4-5, and C5-6 levels. Degenerative changes throughout the cervical facet joints. Upper chest: Emphysematous changes in the lung apices. Other: Vascular calcifications. IMPRESSION: 1. No acute intracranial abnormalities. Chronic atrophy and small vessel ischemic changes. 2. Normal alignment of the cervical spine. Diffuse degenerative changes. No acute displaced fractures identified. Electronically Signed   By: Lucienne Capers M.D.   On: 07/07/2017 03:15    Procedures Procedures (including critical care time)  Medications Ordered in ED Medications - No data to display   Initial Impression / Assessment and Plan / ED Course  I have reviewed the triage vital signs and the nursing notes.  Pertinent imaging results that were available during my care of the patient were reviewed by me and considered in my medical decision making (see chart for details).  Repeated falls.  Abrasion to the occiput.  She is sent for CT of the head and cervical spine.  Laceration is closed with staples.  Old records are reviewed confirming ED visit 1 week ago for fall and scalp laceration.  Last tetanus immunization was in 2014.  CT shows no acute injury.  She is discharged with instructions to use topical antibiotics of the abraded area of the scalp.  Recommended she use a quad cane or walker to try to prevent future falls.  Final Clinical Impressions(s) / ED Diagnoses   Final diagnoses:  Fall, initial encounter  Abrasion of scalp, initial encounter    New Prescriptions New Prescriptions   No medications on file     Delora Fuel, MD 88/28/00 [phone]

## 2017-07-07 NOTE — ED Notes (Signed)
Pt. Has laceration to head from a fall 7 this evening. Pt. States she lost her balance. Pt. States she also fell this morning and had a cut on her right forearm and an abrasion on her left elbow. Educated pt. About the importance of getting a device to steady gate.

## 2017-07-07 NOTE — ED Notes (Signed)
Pt. To CT via stretcher. 

## 2017-07-07 NOTE — Discharge Instructions (Signed)
Please use a cane or a walker at all times.

## 2017-08-16 ENCOUNTER — Encounter: Payer: Self-pay | Admitting: Internal Medicine

## 2017-08-16 ENCOUNTER — Ambulatory Visit: Payer: Medicare Other | Admitting: Internal Medicine

## 2017-08-16 VITALS — BP 124/78 | HR 69 | Ht 60.0 in | Wt 103.0 lb

## 2017-08-16 DIAGNOSIS — J449 Chronic obstructive pulmonary disease, unspecified: Secondary | ICD-10-CM

## 2017-08-16 DIAGNOSIS — J9611 Chronic respiratory failure with hypoxia: Secondary | ICD-10-CM

## 2017-08-16 DIAGNOSIS — Z8701 Personal history of pneumonia (recurrent): Secondary | ICD-10-CM | POA: Diagnosis not present

## 2017-08-16 NOTE — Patient Instructions (Addendum)
ICD-10-CM   1. Chronic respiratory failure with hypoxia (HCC) J96.11   2. COPD GOLD II  J44.9   3. History of bacterial pneumonia Z87.01    Stable disease Falls likely due to balance issues   Plan and Follow-up - Continue DuoNeb 4 times daily and then Flovent inhaler 2 times daily - Continue oxygen - Do CT chest without contrast  Next few weeks for followup of pneumonia changes on CT in 2017 and abnormal CXR  In 2018; will call with results  Followup  - March 2019 or sooner if needed

## 2017-08-16 NOTE — Progress Notes (Signed)
Subjective:     Patient ID: [name], female   DOB: 05/13/1932, 83 y.o.   MRN: [id number]  HPI   OV 05/29/2017  Chief Complaint  Patient presents with  . Follow-up    COPD Gold II. Patient states that she is feeling very weak this morning.    Chronic hypoxemic respiratory failure on oxygen 2 L associated with Gold stage II COPD and large hiatal hernia. She tells me the summer 2018 she had syncopal episodes and then after she started wearing her oxygen on a continuous basis this has not recurred. She is now on DuoNeb and [employer]. Also in spring 2018 and in 2017 she's had recurrent pneumonias. Most recent chest x-ray July 2018 seem to s show resolution but with some chronic lung changes. Currently she is feeling well. She will have high dose flu shot today    OV 08/16/2017  Chief Complaint  Patient presents with  . Follow-up    Pt fell twice within to days dizzy outdoors and hit her back of the head twice in Oct 2018. Pt has pain on the left side affecting her breathing from the fall. Pt states not breathing well in last three weeks.   83 year old female with chronic COPD and chronic hypoxemic respiratory failure on 2 L oxygen.  She has abnormal chest x-ray on account of pneumonia in 2017 CT scan of the chest and a chest x-ray 1 in 2018 was abnormal.  Overall she feels a COPD stable with a COPD Score of 15 as documented below.  Most recently since her last visit in the last few to several weeks she has been having falls and imbalance issues.  She feels these falls are not related to oxygen but related to balance.  This resulted in scalp laceration and bruises in her hands.  She is now using a cane to walk.  She is up-to-date with her flu shot.  She wants her next follow-up in March 2019 because of fear of pneumonia she does want to have a good evaluation of the lungs with CT scan of the chest.   CAT COPD Symptom & Quality of Life Score ([place] trademark) 0 is no burden. 5 is highest  burden 08/16/2017   Never Cough -> Cough all the time 2  No phlegm in chest -> Chest is full of phlegm 2  No chest tightness -> Chest feels very tight 0  No dyspnea for 1 flight stairs/hill -> Very dyspneic for 1 flight of stairs 4  No limitations for ADL at home -> Very limited with ADL at home 3  Confident leaving home -> Not at all confident leaving home 1  Sleep soundly -> Do not sleep soundly because of lung condition 1  Lots of Energy -> No energy at all 3  TOTAL Score (max 40)  15        [name], Aortic stenosis, Arthritis, Asthma, Depression, Diabetes mellitus, Emphysema of lung ([place]), GERD (gastroesophageal reflux disease), Glaucoma, Hyperlipidemia, Hypertension, [name], Murmur, and Nephrolithiasis.   reports that she quit smoking about 44 years ago. Her smoking use included cigarettes. She has a 30.00 pack-year smoking history. she has never used smokeless tobacco.  Past Surgical History:  Procedure Laterality Date  . ABDOMINAL HYSTERECTOMY    . ANKLE SURGERY  1998   d/t fx.  Left ankle  . ANTERIOR AND POSTERIOR REPAIR  06/21/2011   Procedure: ANTERIOR (CYSTOCELE) AND POSTERIOR REPAIR (RECTOCELE);  Surgeon: Maisie Fus;  Location: [facility];  Service: Gynecology;  Laterality: N/A;  . AORTIC VALVE REPLACEMENT N/A 07/17/2013   Procedure: AORTIC VALVE REPLACEMENT (AVR);  Surgeon: Ivin Poot, MD;  Location: [facility];  Service: Open Heart Surgery;  Laterality: N/A;  . CARDIAC CATHETERIZATION    . CARDIAC VALVE REPLACEMENT    . CORONARY ANGIOPLASTY    . CORONARY ARTERY BYPASS GRAFT N/A 07/17/2013   Procedure: CORONARY ARTERY BYPASS GRAFT, TIMES ONE, ON PUMP, USING RIGHT INTERNAL MAMMARY ARTERY.;  Surgeon: Ivin Poot, MD;  Location: [facility];  Service: Open Heart Surgery;  Laterality: N/A;  . FACIAL COSMETIC SURGERY     face lift  . INTRAOPERATIVE TRANSESOPHAGEAL ECHOCARDIOGRAM N/A 07/17/2013   Procedure: INTRAOPERATIVE  TRANSESOPHAGEAL ECHOCARDIOGRAM;  Surgeon: Ivin Poot, MD;  Location: [facility];  Service: Open Heart Surgery;  Laterality: N/A;  . LAPAROSCOPIC ASSISTED VAGINAL HYSTERECTOMY  06/21/2011   Procedure: LAPAROSCOPIC ASSISTED VAGINAL HYSTERECTOMY;  Surgeon: Maisie Fus;  Location: [facility];  Service: Gynecology;  Laterality: N/A;  . LEFT AND RIGHT HEART CATHETERIZATION WITH CORONARY ANGIOGRAM N/A 06/27/2013   Procedure: LEFT AND RIGHT HEART CATHETERIZATION WITH CORONARY ANGIOGRAM;  Surgeon: Ramond Dial, MD;  Location: [facility];  Service: Cardiovascular;  Laterality: N/A;  . NOSE SURGERY    . SALPINGOOPHORECTOMY  06/21/2011   Procedure: SALPINGO OOPHERECTOMY;  Surgeon: Maisie Fus;  Location: [facility];  Service: Gynecology;  Laterality: Bilateral;  . VAGINAL PROLAPSE REPAIR  06/21/2011   Procedure: SACROPEXY;  Surgeon: Maisie Fus;  Location: [facility];  Service: Gynecology;  Laterality: N/A;  sacrospinous ligament suspension    Allergies  Allergen Reactions  . Amaryl [Glimepiride]     Side effects  . Avelox [Moxifloxacin Hcl In Nacl]     nausea  . Ciprofloxacin     nausea  . Lipitor [Atorvastatin] Other (See Comments)    myalgias myalgias  . Lisinopril Other (See Comments)    cough cough  . Other Other (See Comments)    Increased cough  . Prevacid [Lansoprazole]     diarrhea  . Spiriva Handihaler [Tiotropium Bromide Monohydrate]     Increased cough  . Symbicort [Budesonide-Formoterol Fumarate]     Increased cough    Immunization History  Administered Date(s) Administered  . Influenza Split 07/19/2014  . Influenza, High Dose Seasonal PF 06/16/2016, 05/29/2017  . Influenza,inj,Quad PF,6+ Mos 06/19/2015  . Pneumococcal Conjugate-13 09/18/2013  . Pneumococcal Polysaccharide-23 05/14/2006  . Tdap 03/04/2013  . Zoster 08/20/2008, 08/18/2014    Family History  Problem Relation Age of Onset  . Emphysema Father   . Colon cancer Neg Hx   . Esophageal cancer Neg Hx   .  Rectal cancer Neg Hx   . Stomach cancer Neg Hx      Current Outpatient Medications:  .  acetaminophen (TYLENOL) 500 MG tablet, Take 500 mg by mouth every 6 (six) hours as needed for headache., Disp: , Rfl:  .  albuterol (PROVENTIL) (2.5 MG/3ML) 0.083% nebulizer solution, Take 3 mLs (2.5 mg total) by nebulization every 6 (six) hours as needed for wheezing., Disp: 75 mL, Rfl: 0 .  amLODipine (NORVASC) 10 MG tablet, Take 1 tablet (10 mg total) by mouth daily., Disp: 90 tablet, Rfl: 3 .  aspirin EC 81 MG tablet, Take 81 mg by mouth at bedtime., Disp: , Rfl:  .  buPROPion (WELLBUTRIN XL) 150 MG 24 hr tablet, Take 150 mg by mouth daily., Disp: , Rfl: 2 .  cholecalciferol (VITAMIN D) 1000 units tablet, Take 1,000 Units by mouth at bedtime. , Disp: , Rfl:  .  FERREX 150 150 MG capsule, Take 150 mg by mouth daily., Disp: , Rfl: 2 .  fluticasone (FLOVENT HFA) 220 MCG/ACT inhaler, Inhale 2 puffs into the lungs 2 (two) times daily., Disp: , Rfl:  .  furosemide (LASIX) 40 MG tablet, Take 1 tablet (40 mg total) by mouth every other day. (Patient taking differently: Take 20 mg by mouth every other day. ), Disp: 30 tablet, Rfl: 2 .  glimepiride (AMARYL) 4 MG tablet, Take 4 mg by mouth daily with breakfast. , Disp: , Rfl:  .  ipratropium-albuterol (DUONEB) 0.5-2.5 (3) MG/3ML SOLN, Take 3 mLs by nebulization 4 (four) times daily. Dx: J44.9, Disp: 360 mL, Rfl: 5 .  irbesartan (AVAPRO) 150 MG tablet, Take 1 tablet (150 mg total) by mouth daily., Disp: 90 tablet, Rfl: 3 .  Magnesium 250 MG TABS, Take 250 mg by mouth at bedtime. , Disp: , Rfl:  .  Multiple Vitamins-Minerals (OCUVITE-LUTEIN PO), Take 1 tablet by mouth every morning. , Disp: , Rfl:  .  pravastatin (PRAVACHOL) 40 MG tablet, Take 40 mg by mouth every evening. , Disp: , Rfl:  .  PROAIR HFA 108 (90 BASE) MCG/ACT inhaler, Inhale 2 puffs into the lungs 4 (four) times daily as needed for wheezing or shortness of breath. , Disp: , Rfl:  .  sertraline (ZOLOFT)  100 MG tablet, Take 1 tablet (100 mg total) by mouth every morning., Disp: 30 tablet, Rfl: 0   Review of Systems     Objective:   Physical Exam  Constitutional: She is oriented to person, place, and time. She appears well-developed. No distress.  frail  HENT:  Head: Normocephalic and atraumatic.  Right Ear: External ear normal.  Left Ear: External ear normal.  Mouth/Throat: Oropharynx is clear and moist. No oropharyngeal exudate.  Eyes: Conjunctivae and EOM are normal. Pupils are equal, round, and reactive to light. Right eye exhibits no discharge. Left eye exhibits no discharge. No scleral icterus.  Neck: Normal range of motion. Neck supple. No JVD present. No tracheal deviation present. No thyromegaly present.  Cardiovascular: Normal rate, regular rhythm, normal heart sounds and intact distal pulses. Exam reveals no gallop and no friction rub.  No murmur heard. Pulmonary/Chest: Effort normal and breath sounds normal. No respiratory distress. She has no wheezes. She has no rales. She exhibits no tenderness.  o2 on barrell chest  Abdominal: Soft. Bowel sounds are normal. She exhibits no distension and no mass. There is no tenderness. There is no rebound and no guarding.  Musculoskeletal: Normal range of motion. She exhibits no edema or tenderness.  Has cane  Lymphadenopathy:    She has no cervical adenopathy.  Neurological: She is alert and oriented to person, place, and time. She has normal reflexes. No cranial nerve deficit. She exhibits normal muscle tone. Coordination normal.  Skin: Skin is warm and dry. No rash noted. She is not diaphoretic. No erythema. No pallor.  Psychiatric: She has a normal mood and affect. Her behavior is normal. Judgment and thought content normal.  Vitals reviewed.  Vitals:   08/16/17 0914  BP: 124/78  Pulse: 69  SpO2: 95%  Weight: 103 lb (46.7 kg)  Height: 5' (1.524 m)     Facility age limit for growth percentiles is 20 years.      Assessment:       ICD-10-CM   1. Chronic respiratory failure with  hypoxia ([place]) J96.11   2. COPD GOLD II  J44.9   3. History of bacterial pneumonia Z87.01        Plan:      Stable disease Falls likely due to balance issues   Plan and Follow-up - Continue DuoNeb 4 times daily and then Flovent inhaler 2 times daily - Continue oxygen - Do CT chest without contrast  Next few weeks for followup of pneumonia changes on CT in 2017 and abnormal CXR  In 2018; will call with results  Followup  - March 2019 or sooner if needed    Dr. Brand Males, M.D., [facility].C.P Pulmonary and Critical Care Medicine Staff Physician, [place] Director - Interstitial Lung Disease  Program  Pulmonary [place] [facility] [place], [place], [address]  Pager: [phone], If no answer or between  15:00h - 7:00h: call [phone] Telephone: [phone]

## 2017-08-20 ENCOUNTER — Ambulatory Visit (INDEPENDENT_AMBULATORY_CARE_PROVIDER_SITE_OTHER)
Admission: RE | Admit: 2017-08-20 | Discharge: 2017-08-20 | Disposition: A | Discharge status: Home / Self Care | Payer: Medicare Other | Source: Ambulatory Visit | Attending: Internal Medicine | Admitting: Internal Medicine

## 2017-08-20 DIAGNOSIS — J9611 Chronic respiratory failure with hypoxia: Secondary | ICD-10-CM

## 2017-08-20 DIAGNOSIS — J449 Chronic obstructive pulmonary disease, unspecified: Secondary | ICD-10-CM

## 2017-08-31 ENCOUNTER — Ambulatory Visit: Payer: Medicare Other | Admitting: Podiatry

## 2017-08-31 ENCOUNTER — Encounter: Payer: Self-pay | Admitting: Podiatry

## 2017-08-31 DIAGNOSIS — M79609 Pain in unspecified limb: Secondary | ICD-10-CM

## 2017-08-31 DIAGNOSIS — B351 Tinea unguium: Secondary | ICD-10-CM | POA: Diagnosis not present

## 2017-08-31 DIAGNOSIS — E119 Type 2 diabetes mellitus without complications: Secondary | ICD-10-CM | POA: Diagnosis not present

## 2017-08-31 NOTE — Progress Notes (Signed)
Complaint:  Visit Type: Patient returns to my office for continued preventative foot care services. Complaint: Patient states" my nails have grown long and thick and become painful to walk and wear shoes" Patient has been  Diagnosed with DM. The patient presents for preventative foot care services. No changes to ROS  Podiatric Exam: Vascular: dorsalis pedis and posterior tibial pulses are palpable bilateral. Capillary return is immediate. Temperature gradient is WNL. Skin turgor WNL  Sensorium: Normal Semmes Weinstein monofilament test. Normal tactile sensation bilaterally. Nail Exam: Pt has thick disfigured discolored nails with subungual debris noted bilateral entire nail hallux through fifth toenails Ulcer Exam: There is no evidence of ulcer or pre-ulcerative changes or infection. Orthopedic Exam: Muscle tone and strength are WNL. No limitations in general ROM. No crepitus or effusions noted. Foot type and digits show no abnormalities. Bony prominences are unremarkable. Skin: No Porokeratosis. No infection or ulcers  Diagnosis:  Onychomycosis, , Pain in right toe, pain in left toes  Treatment & Plan Procedures and Treatment: Consent by patient was obtained for treatment procedures.   Debridement of mycotic and hypertrophic toenails, 1 through 5 bilateral and clearing of subungual debris. No ulceration, no infection noted.  Return Visit-Office Procedure: Patient instructed to return to the office for a follow up visit 3 months for continued evaluation and treatment.    Gardiner Barefoot DPM

## 2017-09-04 ENCOUNTER — Ambulatory Visit: Payer: Medicare Other | Attending: Family Medicine

## 2017-09-04 DIAGNOSIS — R2689 Other abnormalities of gait and mobility: Secondary | ICD-10-CM | POA: Insufficient documentation

## 2017-09-04 DIAGNOSIS — R2681 Unsteadiness on feet: Secondary | ICD-10-CM | POA: Diagnosis present

## 2017-09-04 DIAGNOSIS — M6281 Muscle weakness (generalized): Secondary | ICD-10-CM | POA: Diagnosis present

## 2017-09-04 NOTE — Therapy (Signed)
[facility] [address] [place], [place], [address] Phone: [phone]   Fax:  [phone]  Physical Therapy Treatment  Patient Details  Name: [name] MRN: [id number] Date of Birth: 1931-11-04 Referring Provider: Dr. Dema Severin   Encounter Date: 09/04/2017  PT End of Session - 09/04/17 1502    Visit Number  1    Number of Visits  17    Date for PT Re-Evaluation  11/03/17    Authorization Type  [employer] Medicare: G-CODE AND PN EVERY 10TH VISIT.     PT Start Time  0930    PT Stop Time  1014    PT Time Calculation (min)  44 min    Equipment Utilized During Treatment  Gait belt    Activity Tolerance  Patient tolerated treatment well;Patient limited by fatigue limited by fatigue but motivated    Behavior During Therapy  [facility] for tasks assessed/performed       Past Medical History:  Diagnosis Date  . Anxiety   . Aortic stenosis   . Arthritis   . Asthma    uses inhaler as needed  . Depression   . Diabetes mellitus   . Emphysema of lung ([place])   . GERD (gastroesophageal reflux disease)   . Glaucoma   . Hyperlipidemia   . Hypertension   . [name]   . Murmur   . Nephrolithiasis     Past Surgical History:  Procedure Laterality Date  . ABDOMINAL HYSTERECTOMY    . ANKLE SURGERY  1998   d/t fx.  Left ankle  . ANTERIOR AND POSTERIOR REPAIR  06/21/2011   Procedure: ANTERIOR (CYSTOCELE) AND POSTERIOR REPAIR (RECTOCELE);  Surgeon: Maisie Fus;  Location: [facility];  Service: Gynecology;  Laterality: N/A;  . AORTIC VALVE REPLACEMENT N/A 07/17/2013   Procedure: AORTIC VALVE REPLACEMENT (AVR);  Surgeon: Ivin Poot, MD;  Location: [facility];  Service: Open Heart Surgery;  Laterality: N/A;  . CARDIAC CATHETERIZATION    . CARDIAC VALVE REPLACEMENT    . CORONARY ANGIOPLASTY    . CORONARY ARTERY BYPASS GRAFT N/A 07/17/2013   Procedure: CORONARY ARTERY BYPASS GRAFT, TIMES ONE, ON PUMP, USING RIGHT  INTERNAL MAMMARY ARTERY.;  Surgeon: Ivin Poot, MD;  Location: [facility];  Service: Open Heart Surgery;  Laterality: N/A;  . FACIAL COSMETIC SURGERY     face lift  . INTRAOPERATIVE TRANSESOPHAGEAL ECHOCARDIOGRAM N/A 07/17/2013   Procedure: INTRAOPERATIVE TRANSESOPHAGEAL ECHOCARDIOGRAM;  Surgeon: Ivin Poot, MD;  Location: [facility];  Service: Open Heart Surgery;  Laterality: N/A;  . LAPAROSCOPIC ASSISTED VAGINAL HYSTERECTOMY  06/21/2011   Procedure: LAPAROSCOPIC ASSISTED VAGINAL HYSTERECTOMY;  Surgeon: Maisie Fus;  Location: [facility];  Service: Gynecology;  Laterality: N/A;  . LEFT AND RIGHT HEART CATHETERIZATION WITH CORONARY ANGIOGRAM N/A 06/27/2013   Procedure: LEFT AND RIGHT HEART CATHETERIZATION WITH CORONARY ANGIOGRAM;  Surgeon: Ramond Dial, MD;  Location: [facility] CATH LAB;  Service: Cardiovascular;  Laterality: N/A;  . NOSE SURGERY    . SALPINGOOPHORECTOMY  06/21/2011   Procedure: SALPINGO OOPHERECTOMY;  Surgeon: Maisie Fus;  Location: [facility];  Service: Gynecology;  Laterality: Bilateral;  . VAGINAL PROLAPSE REPAIR  06/21/2011   Procedure: SACROPEXY;  Surgeon: Maisie Fus;  Location: [facility];  Service: Gynecology;  Laterality: N/A;  sacrospinous ligament suspension    There were no vitals filed for this visit.  Subjective Assessment - 09/04/17 0941    Subjective  Pt presents on SpO2 (  2L) while amb. to exam room with [facility] but did not utilize cane. Pt states she's had PNA twice, and it made her weak and has incr. risk for falls. When pt falls, she can't get up from floor. Pt reports balance has been getting worse over the last few months. She has fallen at home and while traversing curbs (a few months ago) in the community (pt was wearing high heels). Pt hit her head that time but ED said pt didn't have a concussion, however, Dr. Dema Severin believes pt did have a concussion. Pt falls in posterior direction. Pt reports she has fallen at least 8-10 times in the last 6 months.     Pertinent  History  2L of SpO2, osteoporosis, COPD, aortic valve replacement, HTN, HLD, DM, depression, diverticulosis, incontinence, glaucoma, 02/2011: 40-50% carotid stenosis, emphysema, CKD stage 3, CAD, hiatal hernia    Patient Stated Goals  She'd love to stop using SpO2 as it is heavy, she'd like to walk without an AD.    Currently in Pain?  No/denies         [facility] PT Assessment - 09/04/17 0948      Assessment   Medical Diagnosis  Frequent falls    Referring Provider  Dr. Dema Severin    Onset Date/Surgical Date  12/04/15 after first bout of PNA    Hand Dominance  Right    Prior Therapy  none for balance      Precautions   Precautions  Other (comment);Fall pt SpO2 (2L)      Restrictions   Weight Bearing Restrictions  No      Balance Screen   Has the patient fallen in the past 6 months  Yes    How many times?  8-10    Has the patient had a decrease in activity level because of a fear of falling?   Yes    Is the patient reluctant to leave their home because of a fear of falling?   Yes      [place] residence    Living Arrangements  Spouse/significant other    Available Help at Discharge  Family    Type of [place] to enter    Entrance Stairs-Number of Steps  4    Entrance Stairs-Rails  Cannot reach both    [place];Laundry or work area in basement;Able to live on main level with bedroom/bathroom    Alternate Therapist, sports of Steps  12    Alternate Level Stairs-Rails  -- pt doesn't go upstairs    [place] - quad;Walker - 4 wheels;Grab bars - tub/shower      Prior Function   Level of Independence  Independent    Vocation  Retired    Leisure  Going to the movie      Cognition   Overall Cognitive Status  Within Functional Limits for tasks assessed    Memory  -- pt reports issues with memory (word finding)      Sensation   Light Touch  Appears Intact    Additional Comments  Pt denied N/T.       Posture/Postural Control   Posture/Postural Control  Postural limitations    Postural Limitations  Forward head;Rounded Shoulders      ROM / Strength   AROM / PROM / Strength  AROM;Strength      AROM   Overall AROM   Within functional  limits for tasks performed    Overall AROM Comments  BUE/LE AROM WNL      Strength   Overall Strength  Deficits    Overall Strength Comments  B UE strength WFL. BLE: hip flex: 4/5, knee ext: 4/5, knee flex: 4/5, ankle DF: 4/5. B hip ext not formally tested but weakness suspected 2/2 gait deviations. Fatigues easily during testing.       Transfers   Transfers  Sit to Stand;Stand to Sit    Sit to Stand  5: Supervision;With upper extremity assist;From chair/3-in-1    Stand to Sit  5: Supervision;With upper extremity assist;To chair/3-in-1      Ambulation/Gait   Ambulation/Gait  Yes    Ambulation/Gait Assistance  5: Supervision;Other (comment) PT held SpO2 machine intermittently    Ambulation/Gait Assistance Details  Pt with L lateral lean when carrying SpO2 machine. Pt used SBQC intermittently.    Ambulation Distance (Feet)  100 Feet x2    Assistive device  None    Gait Pattern  Step-through pattern;Decreased stride length;Decreased trunk rotation    Ambulation Surface  Level;Indoor    Gait velocity  2.75f/tsec. carrying SpO2 machines, and 2.36ft/sec.with PT carrying SpO2 machine.       Standardized Balance Assessment   Standardized Balance Assessment  Timed Up and Go Test;Dynamic Gait Index      Dynamic Gait Index   Level Surface  Mild Impairment    Change in Gait Speed  Moderate Impairment    Gait with Horizontal Head Turns  Mild Impairment    Gait with Vertical Head Turns  Mild Impairment    Gait and Pivot Turn  Mild Impairment    Step Over Obstacle  Mild Impairment    Step Around Obstacles  Mild Impairment    Steps  Moderate Impairment    Total Score  14    DGI comment:  14/24: indicates pt is at high risk for falls.      Timed Up and  Go Test   TUG  Normal TUG    Normal TUG (seconds)  14.31 sec. not holding SpO2 and 15.62 sec. holding SpO2                          PT Education - 09/04/17 1501    Education provided  Yes    Education Details  PT discussed utilizing rollator to carry SpO2 machine to improve balance. PT educated pt on outcome measures, PT POC, duration and frequency.     Person(s) Educated  Patient    Methods  Explanation    Comprehension  Verbalized understanding       PT Short Term Goals - 09/04/17 1507      PT SHORT TERM GOAL #1   Title  Pt will be IND in HEP to improve balance, endurance, strength, and flexibility. TARGET DATE FOR ALL STGS: 10/02/17    Status  New      PT SHORT TERM GOAL #2   Title  Pt will improve DGI score to >/=16/24 to decr. falls risk.     Status  New      PT SHORT TERM GOAL #3   Title  Pt will improve gait speed with LRAD, not holding SpO2 machine to >/=2.62 ft/sec. to safely amb. in the community.     Status  New      PT SHORT TERM GOAL #4   Title  Pt will report zero falls over the last 2 weeks  to improve safety.     Status  New      PT SHORT TERM GOAL #5   Title  Perform 6MWT and write goals as indicated.     Status  New      Additional Short Term Goals   Additional Short Term Goals  Yes      PT SHORT TERM GOAL #6   Title  Pt will amb. 500' with LRAD, not carrying SpO2 machine, at MOD I level to improve safety during functional mobility.         PT Long Term Goals - 09/04/17 1509      PT LONG TERM GOAL #1   Title  Pt will verbalize understanding fall prevention strategies to reduce falls risk. TARGET DATE FOR ALL LTGS: 10/30/17    Status  New      PT LONG TERM GOAL #2   Title  Pt will improve DGI score to >/=20/24 to decr. falls risk.     Status  New      PT LONG TERM GOAL #3   Title  Pt will amb. 600' over even/uneven terrain with LRAD, at MOD I level, including traversing curbs to improve safety during functional mobility.      Status  New      PT LONG TERM GOAL #4   Title  Pt will amb. 100', at MOD I level (slower speed), over even terrain to amb. safely at home.     Status  New            Plan - 09/04/17 1503    Clinical Impression Statement  Pt is a pleasant 83y/o female presenting to OPPT neuro for frequent falls. Pt's PMH significant for the following: 2L of SpO2, osteoporosis, COPD, aortic valve replacement, HTN, HLD, DM, depression, diverticulosis, incontinence, glaucoma, 02/2011: 40-50% carotid stenosis, emphysema, CKD stage 3, CAD, hiatal hernia. Pt's gait speed and TUG tested while holding SpO2 machine and with PT holding machine. Pt's gait speed and TUG time improved by not holding machine. Pt's TUG time indicates pt is at risk for falls. Pt's gait speed is above risk for falls but is below the speed to amb. safely in the community. Pt's DGI score indicates pt is at risk for falls. The following deficits were noted upon exam: gait deviations, impaired balance, decr. endurance, decr. strength, and impaired posture. Pt would benefit from skilled PT to improve safety during functional mobility.     History and Personal Factors relevant to plan of care:  Pt does not wish to go out 2/2 falls, pt lives in multi-story house    Clinical Presentation  Stable    Clinical Presentation due to:  2L of SpO2, osteoporosis, COPD, aortic valve replacement, HTN, HLD, DM, depression, diverticulosis, incontinence, glaucoma, 02/2011: 40-50% carotid stenosis, emphysema, CKD stage 3, CAD, hiatal hernia    Clinical Decision Making  Moderate    Rehab Potential  Good    Clinical Impairments Affecting Rehab Potential  see above    PT Frequency  2x / week    PT Duration  8 weeks    PT Treatment/Interventions  ADLs/Self Care Home Management;Biofeedback;Therapeutic exercise;Manual techniques;Therapeutic activities;Functional mobility training;Stair training;Gait training;Patient/family education;Orthotic Fit/Training;DME  Instruction;Neuromuscular re-education;Balance training;Vestibular    PT Next Visit Plan  Perform 6MWT and write goals. Provide walking progam and initiate balance, strength, flexibility HEP. Provide fall prevention handout.     Consulted and Agree with Plan of Care  Patient       Patient will  benefit from skilled therapeutic intervention in order to improve the following deficits and impairments:  Abnormal gait, Decreased endurance, Decreased knowledge of use of DME, Decreased balance, Decreased mobility, Impaired flexibility, Postural dysfunction, Decreased strength  Visit Diagnosis: Other abnormalities of gait and mobility - Plan: PT plan of care cert/re-cert  Muscle weakness (generalized) - Plan: PT plan of care cert/re-cert  Unsteadiness on feet - Plan: PT plan of care cert/re-cert   G-Codes - 92/95/74 1512    Functional Assessment Tool Used (Outpatient Only)  DGI: 14/24; gait speed holding SpO2 no AD: 2.11ft/sec; TUG holding SpO2 machine: 15.62 sec.    Functional Limitation  Mobility: Walking and moving around    Mobility: Walking and Moving Around Current Status [phone])  At least 40 percent but less than 60 percent impaired, limited or restricted    Mobility: Walking and Moving Around Goal Status [phone])  At least 1 percent but less than 20 percent impaired, limited or restricted       Problem List Patient Active Problem List   Diagnosis Date Noted  . Anemia, chronic disease 12/07/2016  . Chronic obstructive pulmonary disease ([place])   . Chronic respiratory failure ([place]) 11/19/2016  . CKD (chronic kidney disease), stage III ([place]) 11/19/2016  . Diabetes mellitus type 2, uncontrolled ([place]) 11/19/2016  . Recurrent pneumonia 11/19/2016  . Acute on chronic diastolic heart failure ([place]) 11/19/2016  . Pulmonary HTN ([place]) 11/19/2016  . HLD (hyperlipidemia) 11/19/2016  . Hiatal hernia 05/01/2016  . History of bacterial pneumonia 05/01/2016  . COPD exacerbation ([place]) 11/18/2015  .  Physical deconditioning 07/24/2013  . S/P AVR (aortic valve replacement) 07/23/2013  . S/P CABG x 1 07/23/2013  . Aortic stenosis 06/10/2013  . Essential hypertension 06/10/2013  . COPD GOLD II  10/26/2011    Taheem Fricke L 09/04/2017, 3:15 PM  [facility] [address] [place], [place], [address] Phone: [phone]   Fax:  [phone]  Name: [name] MRN: [id number] Date of Birth: 03-22-1932  Geoffry Paradise, PT,DPT 09/04/17 3:15 PM Phone: [phone] Fax: [phone]

## 2017-09-12 ENCOUNTER — Ambulatory Visit: Payer: Medicare Other

## 2017-09-12 DIAGNOSIS — R2681 Unsteadiness on feet: Secondary | ICD-10-CM

## 2017-09-12 DIAGNOSIS — M6281 Muscle weakness (generalized): Secondary | ICD-10-CM

## 2017-09-12 DIAGNOSIS — R2689 Other abnormalities of gait and mobility: Secondary | ICD-10-CM

## 2017-09-12 NOTE — Therapy (Signed)
[facility] [address] [place], [place], [address] Phone: [phone]   Fax:  [phone]  Physical Therapy Treatment  Patient Details  Name: [name] MRN: [id number] Date of Birth: [birth year] Referring Provider: Dr. Dema Severin   Encounter Date: 09/12/2017  PT End of Session - 09/12/17 1144    Visit Number  2    Number of Visits  17    Date for PT Re-Evaluation  11/03/17    Authorization Type  [employer] Medicare: G-CODE AND PN EVERY 10TH VISIT.     PT Start Time  1102    PT Stop Time  1141    PT Time Calculation (min)  39 min    Equipment Utilized During Treatment  -- S prn    Activity Tolerance  Patient tolerated treatment well    Behavior During Therapy  WFL for tasks assessed/performed       Past Medical History:  Diagnosis Date  . Anxiety   . Aortic stenosis   . Arthritis   . Asthma    uses inhaler as needed  . Depression   . Diabetes mellitus   . Emphysema of lung ([place])   . GERD (gastroesophageal reflux disease)   . Glaucoma   . Hyperlipidemia   . Hypertension   . [name]   . Murmur   . Nephrolithiasis     Past Surgical History:  Procedure Laterality Date  . ABDOMINAL HYSTERECTOMY    . ANKLE SURGERY  1998   d/t fx.  Left ankle  . ANTERIOR AND POSTERIOR REPAIR  06/21/2011   Procedure: ANTERIOR (CYSTOCELE) AND POSTERIOR REPAIR (RECTOCELE);  Surgeon: Maisie Fus;  Location: [facility];  Service: Gynecology;  Laterality: N/A;  . AORTIC VALVE REPLACEMENT N/A 07/17/2013   Procedure: AORTIC VALVE REPLACEMENT (AVR);  Surgeon: Ivin Poot, MD;  Location: [facility];  Service: Open Heart Surgery;  Laterality: N/A;  . CARDIAC CATHETERIZATION    . CARDIAC VALVE REPLACEMENT    . CORONARY ANGIOPLASTY    . CORONARY ARTERY BYPASS GRAFT N/A 07/17/2013   Procedure: CORONARY ARTERY BYPASS GRAFT, TIMES ONE, ON PUMP, USING RIGHT INTERNAL MAMMARY ARTERY.;  Surgeon: Ivin Poot, MD;  Location:  [facility];  Service: Open Heart Surgery;  Laterality: N/A;  . FACIAL COSMETIC SURGERY     face lift  . INTRAOPERATIVE TRANSESOPHAGEAL ECHOCARDIOGRAM N/A 07/17/2013   Procedure: INTRAOPERATIVE TRANSESOPHAGEAL ECHOCARDIOGRAM;  Surgeon: Ivin Poot, MD;  Location: [facility];  Service: Open Heart Surgery;  Laterality: N/A;  . LAPAROSCOPIC ASSISTED VAGINAL HYSTERECTOMY  06/21/2011   Procedure: LAPAROSCOPIC ASSISTED VAGINAL HYSTERECTOMY;  Surgeon: Maisie Fus;  Location: [facility];  Service: Gynecology;  Laterality: N/A;  . LEFT AND RIGHT HEART CATHETERIZATION WITH CORONARY ANGIOGRAM N/A 06/27/2013   Procedure: LEFT AND RIGHT HEART CATHETERIZATION WITH CORONARY ANGIOGRAM;  Surgeon: Ramond Dial, MD;  Location: [facility] CATH LAB;  Service: Cardiovascular;  Laterality: N/A;  . NOSE SURGERY    . SALPINGOOPHORECTOMY  06/21/2011   Procedure: SALPINGO OOPHERECTOMY;  Surgeon: Maisie Fus;  Location: [facility];  Service: Gynecology;  Laterality: Bilateral;  . VAGINAL PROLAPSE REPAIR  06/21/2011   Procedure: SACROPEXY;  Surgeon: Maisie Fus;  Location: [facility];  Service: Gynecology;  Laterality: N/A;  sacrospinous ligament suspension    There were no vitals filed for this visit.  Subjective Assessment - 09/12/17 1106    Subjective  Pt reported she hasn't fallen since last visit but reported she  had some close calls while performing household chores. Pt denied changes. Pt reported her SpO2 tubing is bent and she's not sure is she's getting all the O2 she needs.     Pertinent History  2L of SpO2, osteoporosis, COPD, aortic valve replacement, HTN, HLD, DM, depression, diverticulosis, incontinence, glaucoma, 02/2011: 40-50% carotid stenosis, emphysema, CKD stage 3, CAD, hiatal hernia    Patient Stated Goals  She'd love to stop using SpO2 as it is heavy, she'd like to walk without an AD.    Currently in Pain?  No/denies         [facility] PT Assessment - 09/12/17 1108      6 Minute Walk- Baseline   6 Minute Walk-  Baseline  yes    BP (mmHg)  145/64    HR (bpm)  68    02 Sat (%RA)  97 % with SpO2 2L    Modified Borg Scale for Dyspnea  0- Nothing at all    Perceived Rate of Exertion (Borg)  6-      6 Minute walk- Post Test   6 Minute Walk Post Test  yes    BP (mmHg)  139/57    HR (bpm)  72    02 Sat (%RA)  96 %    Modified Borg Scale for Dyspnea  2- Mild shortness of breath    Perceived Rate of Exertion (Borg)  11- Fairly light      6 minute walk test results    Aerobic Endurance Distance Walked  517    Endurance additional comments  Cues to improve L heel strike. Pt reported after walk, she's has hx of difficulty with LLE, as she's had L hip bursitis. Pt reports she was told she now has sciatic pain and she had an injection in her low back 06/2017, she fell the following week.                            [facility] Adult PT Treatment/Exercise - 09/12/17 1134      Ambulation/Gait   Ambulation/Gait  Yes    Ambulation/Gait Assistance  5: Supervision    Ambulation/Gait Assistance Details  Improved L foot clearance with rollator, with SpO2 machine in rollator basket.     Ambulation Distance (Feet)  200 Feet with rollator, 77' with PT carrying SpO2 machine, 75' pt carrying machine    Assistive device  None;Rollator    Gait Pattern  Step-through pattern;Decreased stride length;Decreased trunk rotation    Ambulation Surface  Level;Indoor    Gait velocity  2.38ft/sec. with rollator.             [facility]: PT Education - 09/12/17 1124    Education provided  Yes    Education Details  Fall prevention handout and education. PT educated pt on using rollator to carry SpO2 machine to improve balance while amb. PT provided pt with walking program.     Person(s) Educated  Patient    Methods  Explanation;Verbal cues;Handout    Comprehension  Returned demonstration;Verbalized understanding       PT Short Term Goals - 09/12/17 1155      PT SHORT TERM GOAL #1   Title  Pt will be IND in  HEP to improve balance, endurance, strength, and flexibility. TARGET DATE FOR ALL STGS: 10/02/17    Status  New      PT SHORT TERM GOAL #2   Title  Pt will improve DGI score  to >/=16/24 to decr. falls risk.     Status  New      PT SHORT TERM GOAL #3   Title  Pt will improve gait speed with LRAD, not holding SpO2 machine to >/=2.62 ft/sec. to safely amb. in the community.     Status  New      PT SHORT TERM GOAL #4   Title  Pt will report zero falls over the last 2 weeks to improve safety.     Status  New      PT SHORT TERM GOAL #5   Title  Perform 6MWT and write goals as indicated.     Status  Achieved      PT SHORT TERM GOAL #6   Title  Pt will amb. 500' with LRAD, not carrying SpO2 machine, at MOD I level to improve safety during functional mobility.         PT Long Term Goals - 09/12/17 1154      PT LONG TERM GOAL #1   Title  Pt will verbalize understanding fall prevention strategies to reduce falls risk. TARGET DATE FOR ALL LTGS: 10/30/17    Status  New      PT LONG TERM GOAL #2   Title  Pt will improve DGI score to >/=20/24 to decr. falls risk.     Status  New      PT LONG TERM GOAL #3   Title  Pt will amb. 600' over even/uneven terrain with LRAD, at MOD I level, including traversing curbs to improve safety during functional mobility.     Status  New      PT LONG TERM GOAL #4   Title  Pt will amb. 100', at MOD I level (slower speed), over even terrain to amb. safely at home.     Status  New      PT LONG TERM GOAL #5   Title  Pt will improve walking distance during 6MWT to 694' to improve endurance.     Status  New            Plan - 09/12/17 1144    Clinical Impression Statement  Pt's walking distance of 42' is below normal limits for her gender and age group, pt has COPD so goal is based on MCD for COPD (177' improvement in distance). PT initiated walking program to improve pt's endurance and encouraged pt to amb. with rollator, so she can place SpO2 machine  in basket to improve balance. Pt required seated rest breaks after activity 2/2 fatigue. Continue with POC.     Rehab Potential  Good    Clinical Impairments Affecting Rehab Potential  see above    PT Frequency  2x / week    PT Duration  8 weeks    PT Treatment/Interventions  ADLs/Self Care Home Management;Biofeedback;Therapeutic exercise;Manual techniques;Therapeutic activities;Functional mobility training;Stair training;Gait training;Patient/family education;Orthotic Fit/Training;DME Instruction;Neuromuscular re-education;Balance training;Vestibular    PT Next Visit Plan   initiate balance, strength, flexibility HEP.     Consulted and Agree with Plan of Care  Patient       Patient will benefit from skilled therapeutic intervention in order to improve the following deficits and impairments:  Abnormal gait, Decreased endurance, Decreased knowledge of use of DME, Decreased balance, Decreased mobility, Impaired flexibility, Postural dysfunction, Decreased strength  Visit Diagnosis: Other abnormalities of gait and mobility  Muscle weakness (generalized)  Unsteadiness on feet     Problem List Patient Active Problem List   Diagnosis Date Noted  .  Anemia, chronic disease 12/07/2016  . Chronic obstructive pulmonary disease ([place])   . Chronic respiratory failure ([place]) 11/19/2016  . CKD (chronic kidney disease), stage III ([place]) 11/19/2016  . Diabetes mellitus type 2, uncontrolled ([place]) 11/19/2016  . Recurrent pneumonia 11/19/2016  . Acute on chronic diastolic heart failure ([place]) 11/19/2016  . Pulmonary HTN ([place]) 11/19/2016  . HLD (hyperlipidemia) 11/19/2016  . Hiatal hernia 05/01/2016  . History of bacterial pneumonia 05/01/2016  . COPD exacerbation ([place]) 11/18/2015  . Physical deconditioning 07/24/2013  . S/P AVR (aortic valve replacement) 07/23/2013  . S/P CABG x 1 07/23/2013  . Aortic stenosis 06/10/2013  . Essential hypertension 06/10/2013  . COPD GOLD II  10/26/2011     Miller,Jennifer L 09/12/2017, 11:55 AM  [facility] [address] [place], [place], [address] Phone: [phone]   Fax:  [phone]  Name: BRITTAINY BUCKER MRN: [id number] Date of Birth: 12/28/1931  Geoffry Paradise, PT,DPT 09/12/17 11:55 AM Phone: [phone] Fax: [phone]

## 2017-09-12 NOTE — Patient Instructions (Addendum)
Walking Program:  Begin walking for exercise for 6 minutes, 1-2 times/day, 5 days/week.   Progress your walking program by adding 1 minutes to your routine each week, as tolerated. Be sure to wear good walking shoes, walk in a safe environment and only progress to your tolerance.      Fall Prevention in the Home Falls can cause injuries and can affect people from all age groups. There are many simple things that you can do to make your home safe and to help prevent falls. What can I do on the outside of my home?  Regularly repair the edges of walkways and driveways and fix any cracks.  Remove high doorway thresholds.  Trim any shrubbery on the main path into your home.  Use bright outdoor lighting.  Clear walkways of debris and clutter, including tools and rocks.  Regularly check that handrails are securely fastened and in good repair. Both sides of any steps should have handrails.  Install guardrails along the edges of any raised decks or porches.  Have leaves, snow, and ice cleared regularly.  Use sand or salt on walkways during winter months.  In the garage, clean up any spills right away, including grease or oil spills. What can I do in the bathroom?  Use night lights.  Install grab bars by the toilet and in the tub and shower. Do not use towel bars as grab bars.  Use non-skid mats or decals on the floor of the tub or shower.  If you need to sit down while you are in the shower, use a plastic, non-slip stool.  Keep the floor dry. Immediately clean up any water that spills on the floor.  Remove soap buildup in the tub or shower on a regular basis.  Attach bath mats securely with double-sided non-slip rug tape.  Remove throw rugs and other tripping hazards from the floor. What can I do in the bedroom?  Use night lights.  Make sure that a bedside light is easy to reach.  Do not use oversized bedding that drapes onto the floor.  Have a firm chair that has side  arms to use for getting dressed.  Remove throw rugs and other tripping hazards from the floor. What can I do in the kitchen?  Clean up any spills right away.  Avoid walking on wet floors.  Place frequently used items in easy-to-reach places.  If you need to reach for something above you, use a sturdy step stool that has a grab bar.  Keep electrical cables out of the way.  Do not use floor polish or wax that makes floors slippery. If you have to use wax, make sure that it is non-skid floor wax.  Remove throw rugs and other tripping hazards from the floor. What can I do in the stairways?  Do not leave any items on the stairs.  Make sure that there are handrails on both sides of the stairs. Fix handrails that are broken or loose. Make sure that handrails are as long as the stairways.  Check any carpeting to make sure that it is firmly attached to the stairs. Fix any carpet that is loose or worn.  Avoid having throw rugs at the top or bottom of stairways, or secure the rugs with carpet tape to prevent them from moving.  Make sure that you have a light switch at the top of the stairs and the bottom of the stairs. If you do not have them, have them installed. What are  some other fall prevention tips?  Wear closed-toe shoes that fit well and support your feet. Wear shoes that have rubber soles or low heels.  When you use a stepladder, make sure that it is completely opened and that the sides are firmly locked. Have someone hold the ladder while you are using it. Do not climb a closed stepladder.  Add color or contrast paint or tape to grab bars and handrails in your home. Place contrasting color strips on the first and last steps.  Use mobility aids as needed, such as canes, walkers, scooters, and crutches.  Turn on lights if it is dark. Replace any light bulbs that burn out.  Set up furniture so that there are clear paths. Keep the furniture in the same spot.  Fix any uneven  floor surfaces.  Choose a carpet design that does not hide the edge of steps of a stairway.  Be aware of any and all pets.  Review your medicines with your healthcare provider. Some medicines can cause dizziness or changes in blood pressure, which increase your risk of falling. Talk with your health care provider about other ways that you can decrease your risk of falls. This may include working with a physical therapist or trainer to improve your strength, balance, and endurance. This information is not intended to replace advice given to you by your health care provider. Make sure you discuss any questions you have with your health care provider. Document Released: 08/25/2002 Document Revised: 02/01/2016 Document Reviewed: 10/09/2014 Elsevier Interactive Patient Education  [employer].

## 2017-09-25 ENCOUNTER — Ambulatory Visit: Payer: Medicare Other | Attending: Family Medicine | Admitting: Physical Therapy

## 2017-09-25 ENCOUNTER — Telehealth: Payer: Self-pay | Admitting: Internal Medicine

## 2017-09-25 DIAGNOSIS — M6281 Muscle weakness (generalized): Secondary | ICD-10-CM | POA: Diagnosis present

## 2017-09-25 DIAGNOSIS — R2681 Unsteadiness on feet: Secondary | ICD-10-CM

## 2017-09-25 DIAGNOSIS — R2689 Other abnormalities of gait and mobility: Secondary | ICD-10-CM

## 2017-09-25 DIAGNOSIS — R911 Solitary pulmonary nodule: Secondary | ICD-10-CM

## 2017-09-25 NOTE — Telephone Encounter (Signed)
[name]: CT chest shows lung nodule - LLL nodule. . 38mm spiculated. Concern for early lung cancer   Plan  - have her do PET scan next few weeks and return to see me.  - let me know when the appointments are    Dr. Brand Males, M.D., [facility].C.P Pulmonary and Critical Care Medicine Staff Physician, [facility] Director - Interstitial Lung Disease  Program  Pulmonary [place] [facility] [place], [place], [address]  Pager: [phone], If no answer or between  15:00h - 7:00h: call [phone] Telephone: [phone]      IMPRESSION: 1. New spiculated 13 x 18 mm subpleural nodule left lower lobe. While this may potentially represent a focal pneumonia, neoplasm is certainly a concern, given the reported clinical history of left rib pain. Consider follow-up chest CT in 3 months to assess for resolution. If the lesion is persistent at that time, PET-CT may be warranted to further evaluate. 2. Emphysema with bullous change in the upper lungs. 3. Enlargement main pulmonary artery suggests pulmonary arterial hypertension.   Electronically Signed   By: Misty Stanley M.D.   On: 08/20/2017 13:01

## 2017-09-25 NOTE — Telephone Encounter (Signed)
Called pt to let her know the results of the ct scan and that MR wanted her to have a PET scan. Pt expressed understanding. Order placed for PET. Will schedule pt for a follow up with MR after see date of PET scan. Nothing further needed at this time.

## 2017-09-25 NOTE — Patient Instructions (Signed)
Standing Marching   Using a chair if necessary, march in place. Repeat 10 times each leg. Do 1 sessions per day.     Hip Backward Kick   Using a chair for balance, keep legs shoulder width apart and toes pointed for- ward. Slowly extend one leg back, keeping knee straight. Do not lean forward. Repeat with other leg. Repeat 10 times each leg.  Do 1 sessions per day.  http://gt2.exer.us/340   Copyright  VHI. All rights reserved.     Hip Side Kick   Holding a chair for balance, keep legs shoulder width apart and toes pointed forward. Swing a leg out to side, keeping knee straight. Do not lean. Repeat using other leg. Repeat 10 times. Do 1 sessions per day.  http://gt2.exer.us/342   Copyright  VHI. All rights reserved.   Feet Heel-Toe "Tandem", Varied Arm Positions - Eyes Open   With eyes open, right foot directly in front of the other, arms out, look straight ahead at a stationary object. Hold 30 seconds. Repeat 1 times per session. Do sessions per day. ALTERNATE FOOT POSITION        Standing On One Leg Without Support .  Stand on one leg in neutral spine without support. Hold 10 seconds. Repeat on other leg. Do 2 repetitions, 1 sets.  http://bt.exer.us/36   Copyright  VHI. All rights reserved.    Side-Stepping   Walk to left side with eyes open. Take even steps, leading with same foot. Make sure each foot lifts off the floor. Repeat in opposite direction. Repeat for 1 minutes per session. Do 1 sessions per day.   Copyright  VHI. All rights reserved.     SIT TO STAND: Feet apart    Place feet close together. Lean chest forward. Raise hips and straighten knees to stand. __10_ reps per set, _1__ sets per day, 5___ days per week Hold onto a support.  Copyright  VHI. All rights reserved.

## 2017-09-26 ENCOUNTER — Encounter: Payer: Self-pay | Admitting: Physical Therapy

## 2017-09-26 NOTE — Therapy (Signed)
[facility] [address] [place], [place], [address] Phone: [phone]   Fax:  [phone]  Physical Therapy Treatment  Patient Details  Name: [name] MRN: [id number] Date of Birth: 03-17-1932 Referring Provider: Dr. Dema Severin   Encounter Date: 09/25/2017  PT End of Session - 09/26/17 1219    Visit Number  3    Number of Visits  17    Date for PT Re-Evaluation  11/03/17    PT Start Time  1401    PT Stop Time  4034    PT Time Calculation (min)  44 min       Past Medical History:  Diagnosis Date  . Anxiety   . Aortic stenosis   . Arthritis   . Asthma    uses inhaler as needed  . Depression   . Diabetes mellitus   . Emphysema of lung ([place])   . GERD (gastroesophageal reflux disease)   . Glaucoma   . Hyperlipidemia   . Hypertension   . [name]   . Murmur   . Nephrolithiasis     Past Surgical History:  Procedure Laterality Date  . ABDOMINAL HYSTERECTOMY    . ANKLE SURGERY  1998   d/t fx.  Left ankle  . ANTERIOR AND POSTERIOR REPAIR  06/21/2011   Procedure: ANTERIOR (CYSTOCELE) AND POSTERIOR REPAIR (RECTOCELE);  Surgeon: Maisie Fus;  Location: [facility];  Service: Gynecology;  Laterality: N/A;  . AORTIC VALVE REPLACEMENT N/A 07/17/2013   Procedure: AORTIC VALVE REPLACEMENT (AVR);  Surgeon: Ivin Poot, MD;  Location: [facility];  Service: Open Heart Surgery;  Laterality: N/A;  . CARDIAC CATHETERIZATION    . CARDIAC VALVE REPLACEMENT    . CORONARY ANGIOPLASTY    . CORONARY ARTERY BYPASS GRAFT N/A 07/17/2013   Procedure: CORONARY ARTERY BYPASS GRAFT, TIMES ONE, ON PUMP, USING RIGHT INTERNAL MAMMARY ARTERY.;  Surgeon: Ivin Poot, MD;  Location: [facility];  Service: Open Heart Surgery;  Laterality: N/A;  . FACIAL COSMETIC SURGERY     face lift  . INTRAOPERATIVE TRANSESOPHAGEAL ECHOCARDIOGRAM N/A 07/17/2013   Procedure: INTRAOPERATIVE TRANSESOPHAGEAL ECHOCARDIOGRAM;  Surgeon: Ivin Poot, MD;  Location: [facility];  Service: Open Heart Surgery;  Laterality: N/A;  . LAPAROSCOPIC ASSISTED VAGINAL HYSTERECTOMY  06/21/2011   Procedure: LAPAROSCOPIC ASSISTED VAGINAL HYSTERECTOMY;  Surgeon: Maisie Fus;  Location: [facility];  Service: Gynecology;  Laterality: N/A;  . LEFT AND RIGHT HEART CATHETERIZATION WITH CORONARY ANGIOGRAM N/A 06/27/2013   Procedure: LEFT AND RIGHT HEART CATHETERIZATION WITH CORONARY ANGIOGRAM;  Surgeon: Ramond Dial, MD;  Location: [facility];  Service: Cardiovascular;  Laterality: N/A;  . NOSE SURGERY    . SALPINGOOPHORECTOMY  06/21/2011   Procedure: SALPINGO OOPHERECTOMY;  Surgeon: Maisie Fus;  Location: [facility];  Service: Gynecology;  Laterality: Bilateral;  . VAGINAL PROLAPSE REPAIR  06/21/2011   Procedure: SACROPEXY;  Surgeon: Maisie Fus;  Location: [facility];  Service: Gynecology;  Laterality: N/A;  sacrospinous ligament suspension    There were no vitals filed for this visit.  Subjective Assessment - 09/26/17 1212    Subjective  Pt states she fell yesterday while leaning over and reaching for plate that was on front stoop - lost balance while reaching and slid down on her bottom    Pertinent History  2L of SpO2, osteoporosis, COPD, aortic valve replacement, HTN, HLD, DM, depression, diverticulosis, incontinence, glaucoma, 02/2011: 40-50% carotid stenosis, emphysema, CKD stage 3, CAD,  hiatal hernia    Patient Stated Goals  She'd love to stop using SpO2 as it is heavy, she'd like to walk without an AD.    Currently in Pain?  No/denies          Pt reported occasional spinning vertigo when she lies down in bed and also when she turns over onto her side  Dix-Hallpike tests performed to determine if pt has BPPV;  Left Dix-Hallpike test (+) with Lt upbeating nystagmus and c/o vertigo Right Dix-Hallpike test (-) with no c/o vertigo and no nystagmus           [facility] Adult PT Treatment/Exercise - 09/26/17 0001      Transfers   Transfers   Sit to Stand    Sit to Stand  5: Supervision    Stand to Sit  5: Supervision    Number of Reps  10 reps    Comments  no UE support used      Vestibular Treatment/Exercise - 09/26/17 0001      Vestibular Treatment/Exercise   Vestibular Treatment Provided  Canalith Repositioning    Canalith Repositioning  Epley Manuever Left       EPLEY MANUEVER LEFT   Number of Reps   2    Overall Response   Improved Symptoms     RESPONSE DETAILS LEFT  no nystagmus noted in any position on 2nd rep of Epley          Balance Exercises - 09/26/17 1214      Balance Exercises: Standing   Other Standing Exercises  Pt performed balance exercises at counter with UE support prn - for HEP;  forward, back and side kicks 10 reps each; marching in place 10 reps each;  tandem stance - both foot positions 30 sec hold:  SLS for 10 secs each foot:  sidestepping 2 reps along counter;  pt alos perofrmed stnading with feet together - EO with horizontal head turns 10 reps; also performed crossovers in front (these 2 exercises not given for HEP due to safety concerns)         PT Education - 09/26/17 1218    Education provided  Yes    Education Details  Balance HEP (see pt instructions) and BPPV info on etiology printed from [employer]    Person(s) Educated  Patient    Methods  Explanation;Demonstration;Handout    Comprehension  Verbalized understanding;Returned demonstration       PT Short Term Goals - 09/12/17 1155      PT SHORT TERM GOAL #1   Title  Pt will be IND in HEP to improve balance, endurance, strength, and flexibility. TARGET DATE FOR ALL STGS: 10/02/17    Status  New      PT SHORT TERM GOAL #2   Title  Pt will improve DGI score to >/=16/24 to decr. falls risk.     Status  New      PT SHORT TERM GOAL #3   Title  Pt will improve gait speed with LRAD, not holding SpO2 machine to >/=2.62 ft/sec. to safely amb. in the community.     Status  New      PT SHORT TERM GOAL #4    Title  Pt will report zero falls over the last 2 weeks to improve safety.     Status  New      PT SHORT TERM GOAL #5   Title  Perform 6MWT and write goals as indicated.  Status  Achieved      PT SHORT TERM GOAL #6   Title  Pt will amb. 500' with LRAD, not carrying SpO2 machine, at MOD I level to improve safety during functional mobility.         PT Long Term Goals - 09/12/17 1154      PT LONG TERM GOAL #1   Title  Pt will verbalize understanding fall prevention strategies to reduce falls risk. TARGET DATE FOR ALL LTGS: 10/30/17    Status  New      PT LONG TERM GOAL #2   Title  Pt will improve DGI score to >/=20/24 to decr. falls risk.     Status  New      PT LONG TERM GOAL #3   Title  Pt will amb. 600' over even/uneven terrain with LRAD, at MOD I level, including traversing curbs to improve safety during functional mobility.     Status  New      PT LONG TERM GOAL #4   Title  Pt will amb. 100', at MOD I level (slower speed), over even terrain to amb. safely at home.     Status  New      PT LONG TERM GOAL #5   Title  Pt will improve walking distance during 6MWT to 694' to improve endurance.     Status  New            Plan - 09/26/17 1220    Clinical Impression Statement  Pt had (+) Lt Dix-Hallpike test with upbeating rotary nystagmus noted, indicative of Lt posterior canalithiasis:  symptoms significanlty improved on 2nd rep of Epley maneuver.  Pt instructed in balance HEP but needs UE support for safety.     Rehab Potential  Good    PT Frequency  2x / week    PT Duration  8 weeks    PT Treatment/Interventions  ADLs/Self Care Home Management;Biofeedback;Therapeutic exercise;Manual techniques;Therapeutic activities;Functional mobility training;Stair training;Gait training;Patient/family education;Orthotic Fit/Training;DME Instruction;Neuromuscular re-education;Balance training;Vestibular    PT Next Visit Plan  recheck Lt BPPV: check balance HEP for any ?'s or problems;  continue balance and strengthening    PT Home Exercise Plan  balance HEP given on 09-25-17    Consulted and Agree with Plan of Care  Patient       Patient will benefit from skilled therapeutic intervention in order to improve the following deficits and impairments:  Abnormal gait, Decreased endurance, Decreased knowledge of use of DME, Decreased balance, Decreased mobility, Impaired flexibility, Postural dysfunction, Decreased strength  Visit Diagnosis: Other abnormalities of gait and mobility  Unsteadiness on feet     Problem List Patient Active Problem List   Diagnosis Date Noted  . Anemia, chronic disease 12/07/2016  . Chronic obstructive pulmonary disease ([place])   . Chronic respiratory failure ([place]) 11/19/2016  . CKD (chronic kidney disease), stage III ([place]) 11/19/2016  . Diabetes mellitus type 2, uncontrolled ([place]) 11/19/2016  . Recurrent pneumonia 11/19/2016  . Acute on chronic diastolic heart failure ([place]) 11/19/2016  . Pulmonary HTN ([place]) 11/19/2016  . HLD (hyperlipidemia) 11/19/2016  . Hiatal hernia 05/01/2016  . History of bacterial pneumonia 05/01/2016  . COPD exacerbation ([place]) 11/18/2015  . Physical deconditioning 07/24/2013  . S/P AVR (aortic valve replacement) 07/23/2013  . S/P CABG x 1 07/23/2013  . Aortic stenosis 06/10/2013  . Essential hypertension 06/10/2013  . COPD GOLD II  10/26/2011    [place], PT 09/26/2017, 12:27 PM  [facility]  [facility] [address] [place], [place], [address] Phone: [phone]   Fax:  [phone]  Name: [name] MRN: [id number] Date of Birth: [birth year]

## 2017-09-27 ENCOUNTER — Telehealth: Payer: Self-pay

## 2017-09-27 ENCOUNTER — Ambulatory Visit: Payer: Medicare Other

## 2017-09-27 DIAGNOSIS — R2689 Other abnormalities of gait and mobility: Secondary | ICD-10-CM

## 2017-09-27 NOTE — Telephone Encounter (Addendum)
Dr. Chase Caller  This was just to keep you informed on your patient, she saw Dr. Dema Severin and imaging was negative for a fx.   Thank you, [name]   Dr. Dema Severin and Dr. Chase Caller ~  [name] presented to PT after falling this morning. She has what appears to be a large hematoma over her L trochanter, with TTP and incr. Pain during weight bearing. I requested she see Dr. Dema Severin to r/o fx or other injury, she will need a note to resume PT. Please see note for details.  Thank you, Geoffry Paradise, PT,DPT 09/27/17 11:26 AM Phone: [phone] Fax: [phone]

## 2017-09-27 NOTE — Therapy (Signed)
[facility] [address] [place], [place], [address] Phone: [phone]   Fax:  [phone]  Physical Therapy Treatment  Patient Details  Name: [name] MRN: [id number] Date of Birth: 02-Oct-1931 Referring Provider: Dr. Dema Severin   Encounter Date: 09/27/2017  PT End of Session - 09/27/17 1119    Visit Number  3 No charge for today    Number of Visits  17    Date for PT Re-Evaluation  11/03/17    Authorization Type  [employer] Medicare: G-CODE AND PN EVERY 10TH VISIT.     PT Start Time  1103 no charge    PT Stop Time  1114    PT Time Calculation (min)  11 min       Past Medical History:  Diagnosis Date  . Anxiety   . Aortic stenosis   . Arthritis   . Asthma    uses inhaler as needed  . Depression   . Diabetes mellitus   . Emphysema of lung ([place])   . GERD (gastroesophageal reflux disease)   . Glaucoma   . Hyperlipidemia   . Hypertension   . [name]   . Murmur   . Nephrolithiasis     Past Surgical History:  Procedure Laterality Date  . ABDOMINAL HYSTERECTOMY    . ANKLE SURGERY  1998   d/t fx.  Left ankle  . ANTERIOR AND POSTERIOR REPAIR  06/21/2011   Procedure: ANTERIOR (CYSTOCELE) AND POSTERIOR REPAIR (RECTOCELE);  Surgeon: Maisie Fus;  Location: [facility];  Service: Gynecology;  Laterality: N/A;  . AORTIC VALVE REPLACEMENT N/A 07/17/2013   Procedure: AORTIC VALVE REPLACEMENT (AVR);  Surgeon: Ivin Poot, MD;  Location: [facility];  Service: Open Heart Surgery;  Laterality: N/A;  . CARDIAC CATHETERIZATION    . CARDIAC VALVE REPLACEMENT    . CORONARY ANGIOPLASTY    . CORONARY ARTERY BYPASS GRAFT N/A 07/17/2013   Procedure: CORONARY ARTERY BYPASS GRAFT, TIMES ONE, ON PUMP, USING RIGHT INTERNAL MAMMARY ARTERY.;  Surgeon: Ivin Poot, MD;  Location: [facility];  Service: Open Heart Surgery;  Laterality: N/A;  . FACIAL COSMETIC SURGERY     face lift  . INTRAOPERATIVE TRANSESOPHAGEAL  ECHOCARDIOGRAM N/A 07/17/2013   Procedure: INTRAOPERATIVE TRANSESOPHAGEAL ECHOCARDIOGRAM;  Surgeon: Ivin Poot, MD;  Location: [facility];  Service: Open Heart Surgery;  Laterality: N/A;  . LAPAROSCOPIC ASSISTED VAGINAL HYSTERECTOMY  06/21/2011   Procedure: LAPAROSCOPIC ASSISTED VAGINAL HYSTERECTOMY;  Surgeon: Maisie Fus;  Location: [facility];  Service: Gynecology;  Laterality: N/A;  . LEFT AND RIGHT HEART CATHETERIZATION WITH CORONARY ANGIOGRAM N/A 06/27/2013   Procedure: LEFT AND RIGHT HEART CATHETERIZATION WITH CORONARY ANGIOGRAM;  Surgeon: Ramond Dial, MD;  Location: [facility] C[facility];  Service: Cardiovascular;  Laterality: N/A;  . NOSE SURGERY    . SALPINGOOPHORECTOMY  06/21/2011   Procedure: SALPINGO OOPHERECTOMY;  Surgeon: Maisie Fus;  Location: [facility];  Service: Gynecology;  Laterality: Bilateral;  . VAGINAL PROLAPSE REPAIR  06/21/2011   Procedure: SACROPEXY;  Surgeon: Maisie Fus;  Location: [facility];  Service: Gynecology;  Laterality: N/A;  sacrospinous ligament suspension    There were no vitals filed for this visit.  Subjective Assessment - 09/27/17 1118    Subjective  Pt reported she fell again this morning, after getting up OOB and using cane. Pt denied dizziness and stated "I just fell." Pt asked PT to go to the bathroom with pt in order for PT to  assess "bump" on her L hip. Pt amb. to PT session with rollator and reports she thinks she should use it all the time.     Pertinent History  2L of SpO2, osteoporosis, COPD, aortic valve replacement, HTN, HLD, DM, depression, diverticulosis, incontinence, glaucoma, 02/2011: 40-50% carotid stenosis, emphysema, CKD stage 3, CAD, hiatal hernia    Patient Stated Goals  She'd love to stop using SpO2 as it is heavy, she'd like to walk without an AD.    Currently in Pain?  Yes                            PT Short Term Goals - 09/12/17 1155      PT SHORT TERM GOAL #1   Title  Pt will be IND in HEP to improve balance,  endurance, strength, and flexibility. TARGET DATE FOR ALL STGS: 10/02/17    Status  New      PT SHORT TERM GOAL #2   Title  Pt will improve DGI score to >/=16/24 to decr. falls risk.     Status  New      PT SHORT TERM GOAL #3   Title  Pt will improve gait speed with LRAD, not holding SpO2 machine to >/=2.62 ft/sec. to safely amb. in the community.     Status  New      PT SHORT TERM GOAL #4   Title  Pt will report zero falls over the last 2 weeks to improve safety.     Status  New      PT SHORT TERM GOAL #5   Title  Perform 6MWT and write goals as indicated.     Status  Achieved      PT SHORT TERM GOAL #6   Title  Pt will amb. 500' with LRAD, not carrying SpO2 machine, at MOD I level to improve safety during functional mobility.         PT Long Term Goals - 09/12/17 1154      PT LONG TERM GOAL #1   Title  Pt will verbalize understanding fall prevention strategies to reduce falls risk. TARGET DATE FOR ALL LTGS: 10/30/17    Status  New      PT LONG TERM GOAL #2   Title  Pt will improve DGI score to >/=20/24 to decr. falls risk.     Status  New      PT LONG TERM GOAL #3   Title  Pt will amb. 600' over even/uneven terrain with LRAD, at MOD I level, including traversing curbs to improve safety during functional mobility.     Status  New      PT LONG TERM GOAL #4   Title  Pt will amb. 100', at MOD I level (slower speed), over even terrain to amb. safely at home.     Status  New      PT LONG TERM GOAL #5   Title  Pt will improve walking distance during 6MWT to 694' to improve endurance.     Status  New            Plan - 09/27/17 1120    Clinical Impression Statement  No charge for today's visit. PT assessed pt's L hip and found pt TTP in L trochanter area, pt presenting with what appears to be a large hematoma. Pt reported pain incr. in LLE with weight bearing and palpation. PT educated pt on the importance of calling  MD in order to make an appt, to ensure pt does not  have a LE fx. PT educated pt that she would need a note to resume PT. PT explained this to pt's husband, [name], as well. PT followed pt to the car to ensure she txf'd safey into car. PT encouraged pt to use rollator at all time, pt mentioned crutches and PT told pt not to use crutches.  PT will send note to Dr. Dema Severin.   Rehab Potential  Good    PT Frequency  2x / week    PT Duration  8 weeks    PT Treatment/Interventions  ADLs/Self Care Home Management;Biofeedback;Therapeutic exercise;Manual techniques;Therapeutic activities;Functional mobility training;Stair training;Gait training;Patient/family education;Orthotic Fit/Training;DME Instruction;Neuromuscular re-education;Balance training;Vestibular    PT Next Visit Plan  Needs note to resume PT after second fall and L hip edema. recheck Lt BPPV: check balance HEP for any ?'s or problems; continue balance and strengthening    PT Home Exercise Plan  balance HEP given on 09-25-17    Consulted and Agree with Plan of Care  Patient       Patient will benefit from skilled therapeutic intervention in order to improve the following deficits and impairments:  Abnormal gait, Decreased endurance, Decreased knowledge of use of DME, Decreased balance, Decreased mobility, Impaired flexibility, Postural dysfunction, Decreased strength  Visit Diagnosis: Other abnormalities of gait and mobility     Problem List Patient Active Problem List   Diagnosis Date Noted  . Anemia, chronic disease 12/07/2016  . Chronic obstructive pulmonary disease ([place])   . Chronic respiratory failure ([place]) 11/19/2016  . CKD (chronic kidney disease), stage III ([place]) 11/19/2016  . Diabetes mellitus type 2, uncontrolled ([place]) 11/19/2016  . Recurrent pneumonia 11/19/2016  . Acute on chronic diastolic heart failure ([place]) 11/19/2016  . Pulmonary HTN ([place]) 11/19/2016  . HLD (hyperlipidemia) 11/19/2016  . Hiatal hernia 05/01/2016  . History of bacterial pneumonia 05/01/2016  . COPD  exacerbation ([place]) 11/18/2015  . Physical deconditioning 07/24/2013  . S/P AVR (aortic valve replacement) 07/23/2013  . S/P CABG x 1 07/23/2013  . Aortic stenosis 06/10/2013  . Essential hypertension 06/10/2013  . COPD GOLD II  10/26/2011    Monico Sudduth L 09/27/2017, 11:23 AM  [facility] [address] [place], [place], [address] Phone: [phone]   Fax:  [phone]  Name: [name] MRN: [id number] Date of Birth: 01-Feb-1932  Geoffry Paradise, PT,DPT 09/27/17 11:24 AM Phone: [phone] Fax: [phone]

## 2017-10-02 ENCOUNTER — Ambulatory Visit: Payer: Medicare Other | Admitting: Physical Therapy

## 2017-10-02 DIAGNOSIS — R2681 Unsteadiness on feet: Secondary | ICD-10-CM

## 2017-10-02 DIAGNOSIS — R2689 Other abnormalities of gait and mobility: Secondary | ICD-10-CM

## 2017-10-02 DIAGNOSIS — M6281 Muscle weakness (generalized): Secondary | ICD-10-CM

## 2017-10-03 ENCOUNTER — Encounter: Payer: Self-pay | Admitting: Physical Therapy

## 2017-10-03 NOTE — Therapy (Signed)
[facility] [address] [place], [place], [address] Phone: [phone]   Fax:  [phone]  Physical Therapy Treatment  Patient Details  Name: [name] MRN: [id number] Date of Birth: [birth year] Referring Provider: Dr. Dema Severin   Encounter Date: 10/02/2017  PT End of Session - 10/03/17 0937    Visit Number  4    Number of Visits  17    Date for PT Re-Evaluation  11/03/17    Authorization Type  [employer] Medicare: G-CODE AND PN EVERY 10TH VISIT.     PT Start Time  [phone]    PT Stop Time  1017    PT Time Calculation (min)  44 min       Past Medical History:  Diagnosis Date  . Anxiety   . Aortic stenosis   . Arthritis   . Asthma    uses inhaler as needed  . Depression   . Diabetes mellitus   . Emphysema of lung ([place])   . GERD (gastroesophageal reflux disease)   . Glaucoma   . Hyperlipidemia   . Hypertension   . [name]   . Murmur   . Nephrolithiasis     Past Surgical History:  Procedure Laterality Date  . ABDOMINAL HYSTERECTOMY    . ANKLE SURGERY  1998   d/t fx.  Left ankle  . ANTERIOR AND POSTERIOR REPAIR  06/21/2011   Procedure: ANTERIOR (CYSTOCELE) AND POSTERIOR REPAIR (RECTOCELE);  Surgeon: Maisie Fus;  Location: [facility];  Service: Gynecology;  Laterality: N/A;  . AORTIC VALVE REPLACEMENT N/A 07/17/2013   Procedure: AORTIC VALVE REPLACEMENT (AVR);  Surgeon: Ivin Poot, MD;  Location: [facility];  Service: Open Heart Surgery;  Laterality: N/A;  . CARDIAC CATHETERIZATION    . CARDIAC VALVE REPLACEMENT    . CORONARY ANGIOPLASTY    . CORONARY ARTERY BYPASS GRAFT N/A 07/17/2013   Procedure: CORONARY ARTERY BYPASS GRAFT, TIMES ONE, ON PUMP, USING RIGHT INTERNAL MAMMARY ARTERY.;  Surgeon: Ivin Poot, MD;  Location: [facility];  Service: Open Heart Surgery;  Laterality: N/A;  . FACIAL COSMETIC SURGERY     face lift  . INTRAOPERATIVE TRANSESOPHAGEAL ECHOCARDIOGRAM N/A 07/17/2013   Procedure: INTRAOPERATIVE TRANSESOPHAGEAL ECHOCARDIOGRAM;  Surgeon: Ivin Poot, MD;  Location: [facility];  Service: Open Heart Surgery;  Laterality: N/A;  . LAPAROSCOPIC ASSISTED VAGINAL HYSTERECTOMY  06/21/2011   Procedure: LAPAROSCOPIC ASSISTED VAGINAL HYSTERECTOMY;  Surgeon: Maisie Fus;  Location: [facility];  Service: Gynecology;  Laterality: N/A;  . LEFT AND RIGHT HEART CATHETERIZATION WITH CORONARY ANGIOGRAM N/A 06/27/2013   Procedure: LEFT AND RIGHT HEART CATHETERIZATION WITH CORONARY ANGIOGRAM;  Surgeon: Ramond Dial, MD;  Location: [facility] CATH LAB;  Service: Cardiovascular;  Laterality: N/A;  . NOSE SURGERY    . SALPINGOOPHORECTOMY  06/21/2011   Procedure: SALPINGO OOPHERECTOMY;  Surgeon: Maisie Fus;  Location: [facility];  Service: Gynecology;  Laterality: Bilateral;  . VAGINAL PROLAPSE REPAIR  06/21/2011   Procedure: SACROPEXY;  Surgeon: Maisie Fus;  Location: [facility];  Service: Gynecology;  Laterality: N/A;  sacrospinous ligament suspension    There were no vitals filed for this visit.  Subjective Assessment - 10/03/17 0928    Subjective  Pt has order from MD to resume PT with no restrictions; states she saw MD on Fri., 09-28-17 due to fall resulting in hematoma over Lt hip; x-ray was done and showed no hip fracture; pt has large hematoma over greater trochanter area  Pertinent History  2L of SpO2, osteoporosis, COPD, aortic valve replacement, HTN, HLD, DM, depression, diverticulosis, incontinence, glaucoma, 02/2011: 40-50% carotid stenosis, emphysema, CKD stage 3, CAD, hiatal hernia    Patient Stated Goals  She'd love to stop using SpO2 as it is heavy, she'd like to walk without an AD.    Currently in Pain?  No/denies                      [facility] Adult PT Treatment/Exercise - 10/03/17 0001      Transfers   Transfers  Sit to Stand    Sit to Stand  5: Supervision    Stand to Sit  5: Supervision    Number of Reps  10 reps    Comments  no UE support used       Ambulation/Gait   Ambulation/Gait  Yes    Ambulation/Gait Assistance  5: Supervision    Ambulation/Gait Assistance Details  rollator height was adjusted - raised to facilitate more upright posture during amb. with use of RW    Ambulation Distance (Feet)  230 Feet    Assistive device  Rollator    Gait Pattern  Step-through pattern;Decreased stride length;Decreased trunk rotation    Ambulation Surface  Level;Indoor      High Level Balance   High Level Balance Activities  Side stepping;Braiding performed inside // bars with UE support prn      Exercises   Exercises  Lumbar;Knee/Hip;Ankle      Lumbar Exercises: Aerobic   Recumbent Bike  SciFit level 2.0 x 5" with UE's & LE's      Lumbar Exercises: Machines for Strengthening   Leg Press  35# bil. LE's 10 reps x 1 set      Lumbar Exercises: Standing   Heel Raises  10 reps bil. LE's          Balance Exercises - 10/03/17 0935      Balance Exercises: Standing   Tandem Stance  Eyes open;Intermittent upper extremity support;2 reps;10 secs    Rockerboard  Anterior/posterior;10 reps;UE support    Sidestepping  2 reps inside bars    Other Standing Exercises  Marching in place inside // bars with UE support prn;  stepping over and back of balance beam with minimal UE support on // bar 10 reps each foot          PT Short Term Goals - 09/12/17 1155      PT SHORT TERM GOAL #1   Title  Pt will be IND in HEP to improve balance, endurance, strength, and flexibility. TARGET DATE FOR ALL STGS: 10/02/17    Status  New      PT SHORT TERM GOAL #2   Title  Pt will improve DGI score to >/=16/24 to decr. falls risk.     Status  New      PT SHORT TERM GOAL #3   Title  Pt will improve gait speed with LRAD, not holding SpO2 machine to >/=2.62 ft/sec. to safely amb. in the community.     Status  New      PT SHORT TERM GOAL #4   Title  Pt will report zero falls over the last 2 weeks to improve safety.     Status  New      PT SHORT TERM GOAL  #5   Title  Perform 6MWT and write goals as indicated.     Status  Achieved      PT SHORT  TERM GOAL #6   Title  Pt will amb. 500' with LRAD, not carrying SpO2 machine, at MOD I level to improve safety during functional mobility.         PT Long Term Goals - 09/12/17 1154      PT LONG TERM GOAL #1   Title  Pt will verbalize understanding fall prevention strategies to reduce falls risk. TARGET DATE FOR ALL LTGS: 10/30/17    Status  New      PT LONG TERM GOAL #2   Title  Pt will improve DGI score to >/=20/24 to decr. falls risk.     Status  New      PT LONG TERM GOAL #3   Title  Pt will amb. 600' over even/uneven terrain with LRAD, at MOD I level, including traversing curbs to improve safety during functional mobility.     Status  New      PT LONG TERM GOAL #4   Title  Pt will amb. 100', at MOD I level (slower speed), over even terrain to amb. safely at home.     Status  New      PT LONG TERM GOAL #5   Title  Pt will improve walking distance during 6MWT to 694' to improve endurance.     Status  New            Plan - 10/03/17 4270    Clinical Impression Statement  Pt demonstrates decr. standing balance, especially decr. high level balance skills including SLS and tandem stance.  Endurance/activity tolerance limited by COPD/respiratory issues with pt on 2L oxygen. Pt's posture improved with use of rollator after height was adjusted to facilitate more upright posture.    Rehab Potential  Good    Clinical Impairments Affecting Rehab Potential  see above    PT Frequency  2x / week    PT Duration  8 weeks    PT Treatment/Interventions  ADLs/Self Care Home Management;Biofeedback;Therapeutic exercise;Manual techniques;Therapeutic activities;Functional mobility training;Stair training;Gait training;Patient/family education;Orthotic Fit/Training;DME Instruction;Neuromuscular re-education;Balance training;Vestibular    PT Next Visit Plan  assess STG's? (due date is 10-02-17 but pt has  had limited attendance)    PT Home Exercise Plan  balance HEP given on 09-25-17    Consulted and Agree with Plan of Care  Patient       Patient will benefit from skilled therapeutic intervention in order to improve the following deficits and impairments:  Abnormal gait, Decreased endurance, Decreased knowledge of use of DME, Decreased balance, Decreased mobility, Impaired flexibility, Postural dysfunction, Decreased strength  Visit Diagnosis: Other abnormalities of gait and mobility  Unsteadiness on feet  Muscle weakness (generalized)     Problem List Patient Active Problem List   Diagnosis Date Noted  . Anemia, chronic disease 12/07/2016  . Chronic obstructive pulmonary disease ([place])   . Chronic respiratory failure ([employer]) 11/19/2016  . CKD (chronic kidney disease), stage III ([place]) 11/19/2016  . Diabetes mellitus type 2, uncontrolled ([place]) 11/19/2016  . Recurrent pneumonia 11/19/2016  . Acute on chronic diastolic heart failure ([place]) 11/19/2016  . Pulmonary HTN ([place]) 11/19/2016  . HLD (hyperlipidemia) 11/19/2016  . Hiatal hernia 05/01/2016  . History of bacterial pneumonia 05/01/2016  . COPD exacerbation ([place]) 11/18/2015  . Physical deconditioning 07/24/2013  . S/P AVR (aortic valve replacement) 07/23/2013  . S/P CABG x 1 07/23/2013  . Aortic stenosis 06/10/2013  . Essential hypertension 06/10/2013  . COPD GOLD II  10/26/2011    [place], PT 10/03/2017, 9:44  [facility] [address] [place], [place], [address] Phone: [phone]   Fax:  [phone]  Name: TRELLA THURMOND MRN: [id number] Date of Birth: June 10, 1932

## 2017-10-04 ENCOUNTER — Ambulatory Visit: Payer: Medicare Other

## 2017-10-04 VITALS — HR 65

## 2017-10-04 DIAGNOSIS — R2689 Other abnormalities of gait and mobility: Secondary | ICD-10-CM | POA: Diagnosis not present

## 2017-10-04 DIAGNOSIS — M6281 Muscle weakness (generalized): Secondary | ICD-10-CM

## 2017-10-04 DIAGNOSIS — R2681 Unsteadiness on feet: Secondary | ICD-10-CM

## 2017-10-04 NOTE — Patient Instructions (Signed)
Standing Marching   Using a chair if necessary, march in place. Repeat 10 times each leg. Do 1 sessions per day.     Hip Backward Kick   Using a chair for balance, keep legs shoulder width apart and toes pointed for- ward. Slowly extend one leg back, keeping knee straight. Do not lean forward. Repeat with other leg. Repeat 10 times each leg.  Do 1 sessions per day.  http://gt2.exer.us/340   Copyright  VHI. All rights reserved.     Hip Side Kick   Holding a chair for balance, keep legs shoulder width apart and toes pointed forward. Swing a leg out to side, keeping knee straight. Do not lean. Repeat using other leg. Repeat 10 times. Do 1 sessions per day.  http://gt2.exer.us/342   Copyright  VHI. All rights reserved.   Feet Heel-Toe "Tandem", Varied Arm Positions - Eyes Open   With eyes open, right foot directly in front of the other, arms out, look straight ahead at a stationary object. Hold 30 seconds. Repeat 1 times per session. Do sessions per day. ALTERNATE FOOT POSITION        Standing On One Leg Without Support .  Stand on one leg in neutral spine without support. Hold 10 seconds. Repeat on other leg. Do 2 repetitions, 1 sets.  http://bt.exer.us/36   Copyright  VHI. All rights reserved.    Side-Stepping   Walk to left side with eyes open. Take even steps, leading with same foot. Make sure each foot lifts off the floor. Repeat in opposite direction. Repeat for 1 minutes per session. Do 1 sessions per day.   Copyright  VHI. All rights reserved.     SIT TO STAND: Feet apart    Place feet close together. Lean chest forward. Raise hips and straighten knees to stand. __10_ reps per set, _1__ sets per day, 5___ days per week Hold onto a support.  Copyright  VHI. All rights reserved.

## 2017-10-04 NOTE — Therapy (Signed)
[facility] [address] [place], [place], [address] Phone: [phone]   Fax:  [phone]  Physical Therapy Treatment  Patient Details  Name: [name] MRN: [id number] Date of Birth: [birth year] Referring Provider: Dr. Dema Severin   Encounter Date: 10/04/2017  PT End of Session - 10/04/17 1152    Visit Number  5    Number of Visits  17    Date for PT Re-Evaluation  11/03/17    Authorization Type  [employer] Medicare: G-CODE AND PN EVERY 10TH VISIT.     PT Start Time  1100    PT Stop Time  1142    PT Time Calculation (min)  42 min    Equipment Utilized During Treatment  Gait belt    Activity Tolerance  Patient tolerated treatment well;Treatment limited secondary to medical complications (Comment) pt required rest breaks after DGI 2/2 slight SOB and decr. SaO2 on 2L of O2.     Behavior During Therapy  [facility] for tasks assessed/performed       Past Medical History:  Diagnosis Date  . Anxiety   . Aortic stenosis   . Arthritis   . Asthma    uses inhaler as needed  . Depression   . Diabetes mellitus   . Emphysema of lung ([place])   . GERD (gastroesophageal reflux disease)   . Glaucoma   . Hyperlipidemia   . Hypertension   . [name]   . Murmur   . Nephrolithiasis     Past Surgical History:  Procedure Laterality Date  . ABDOMINAL HYSTERECTOMY    . ANKLE SURGERY  1998   d/t fx.  Left ankle  . ANTERIOR AND POSTERIOR REPAIR  06/21/2011   Procedure: ANTERIOR (CYSTOCELE) AND POSTERIOR REPAIR (RECTOCELE);  Surgeon: Maisie Fus;  Location: [facility];  Service: Gynecology;  Laterality: N/A;  . AORTIC VALVE REPLACEMENT N/A 07/17/2013   Procedure: AORTIC VALVE REPLACEMENT (AVR);  Surgeon: Ivin Poot, MD;  Location: [facility];  Service: Open Heart Surgery;  Laterality: N/A;  . CARDIAC CATHETERIZATION    . CARDIAC VALVE REPLACEMENT    . CORONARY ANGIOPLASTY    . CORONARY ARTERY BYPASS GRAFT N/A 07/17/2013   Procedure: CORONARY ARTERY BYPASS GRAFT, TIMES ONE, ON PUMP, USING RIGHT INTERNAL MAMMARY ARTERY.;  Surgeon: Ivin Poot, MD;  Location: [facility];  Service: Open Heart Surgery;  Laterality: N/A;  . FACIAL COSMETIC SURGERY     face lift  . INTRAOPERATIVE TRANSESOPHAGEAL ECHOCARDIOGRAM N/A 07/17/2013   Procedure: INTRAOPERATIVE TRANSESOPHAGEAL ECHOCARDIOGRAM;  Surgeon: Ivin Poot, MD;  Location: [facility];  Service: Open Heart Surgery;  Laterality: N/A;  . LAPAROSCOPIC ASSISTED VAGINAL HYSTERECTOMY  06/21/2011   Procedure: LAPAROSCOPIC ASSISTED VAGINAL HYSTERECTOMY;  Surgeon: Maisie Fus;  Location: [facility];  Service: Gynecology;  Laterality: N/A;  . LEFT AND RIGHT HEART CATHETERIZATION WITH CORONARY ANGIOGRAM N/A 06/27/2013   Procedure: LEFT AND RIGHT HEART CATHETERIZATION WITH CORONARY ANGIOGRAM;  Surgeon: Ramond Dial, MD;  Location: [facility] CATH LAB;  Service: Cardiovascular;  Laterality: N/A;  . NOSE SURGERY    . SALPINGOOPHORECTOMY  06/21/2011   Procedure: SALPINGO OOPHERECTOMY;  Surgeon: Maisie Fus;  Location: [facility];  Service: Gynecology;  Laterality: Bilateral;  . VAGINAL PROLAPSE REPAIR  06/21/2011   Procedure: SACROPEXY;  Surgeon: Maisie Fus;  Location: [facility];  Service: Gynecology;  Laterality: N/A;  sacrospinous ligament suspension    Vitals:   10/04/17 1108  Pulse: 65  SpO2: 96%    Subjective Assessment - 10/04/17 1102    Subjective  Pt fell again walking on sidewalk towards her house, she was using cane. Her husband caught her and she didn't hit her head. Pt reported Dr. Dema Severin prescribed a new diabetes medication but pt is not sure of the name and will bring it next time. Pt reported HEP is going well.     Pertinent History  2L of SpO2, osteoporosis, COPD, aortic valve replacement, HTN, HLD, DM, depression, diverticulosis, incontinence, glaucoma, 02/2011: 40-50% carotid stenosis, emphysema, CKD stage 3, CAD, hiatal hernia    Patient Stated Goals  She'd love to stop using  SpO2 as it is heavy, she'd like to walk without an AD.    Currently in Pain?  No/denies         Neuro re-ed and therex HEP: Pt performed with 1-2 UE support and S for safety. Pt performed safely and added squats and braiding to HEP. Please see pt instructions for HEP details.  Pt required rest breaks after exercises 2/2 SOB.             [facility] Adult PT Treatment/Exercise - 10/04/17 1106      Ambulation/Gait   Ambulation/Gait  Yes    Ambulation/Gait Assistance  5: Supervision    Ambulation/Gait Assistance Details  Cues to improve heel strike.     Ambulation Distance (Feet)  100 Feet 200'    Assistive device  Rollator;None    Gait Pattern  Step-through pattern;Decreased stride length;Decreased trunk rotation    Ambulation Surface  Level;Indoor    Gait velocity  2.23f/sec with rollator      Standardized Balance Assessment   Standardized Balance Assessment  Dynamic Gait Index      Dynamic Gait Index   Level Surface  Mild Impairment    Change in Gait Speed  Mild Impairment    Gait with Horizontal Head Turns  Mild Impairment    Gait with Vertical Head Turns  Mild Impairment    Gait and Pivot Turn  Normal    Step Over Obstacle  Mild Impairment    Step Around Obstacles  Normal    Steps  Mild Impairment    Total Score  18           PT Education - 10/04/17 1151    Education provided  Yes    Education Details  PT encouraged pt to use rollator at all times, in order to reduce risk of falling. PT reviewed HEP, with pt and goal progress. PT encouraged pt to use spirometers as prescribed upon d/c, as pt asked if she should use them (because she has not used them upon d/c for pneumonia despite instructions to perform spirometer exercises).     Person(s) Educated  Patient    Methods  Explanation    Comprehension  Verbalized understanding       PT Short Term Goals - 10/04/17 1156      PT SHORT TERM GOAL #1   Title  Pt will be IND in HEP to improve balance, endurance,  strength, and flexibility. TARGET DATE FOR ALL STGS: 10/02/17    Status  Achieved      PT SHORT TERM GOAL #2   Title  Pt will improve DGI score to >/=16/24 to decr. falls risk.     Status  Achieved      PT SHORT TERM GOAL #3   Title  Pt will improve gait speed with LRAD, not holding SpO2 machine to >/=  2.62 ft/sec. to safely amb. in the community.     Status  Not Met      PT SHORT TERM GOAL #4   Title  Pt will report zero falls over the last 2 weeks to improve safety.     Status  Not Met      PT SHORT TERM GOAL #5   Title  Perform 6MWT and write goals as indicated.     Status  Achieved      PT SHORT TERM GOAL #6   Title  Pt will amb. 500' with LRAD, not carrying SpO2 machine, at MOD I level to improve safety during functional mobility.         PT Long Term Goals - 09/12/17 1154      PT LONG TERM GOAL #1   Title  Pt will verbalize understanding fall prevention strategies to reduce falls risk. TARGET DATE FOR ALL LTGS: 10/30/17    Status  New      PT LONG TERM GOAL #2   Title  Pt will improve DGI score to >/=20/24 to decr. falls risk.     Status  New      PT LONG TERM GOAL #3   Title  Pt will amb. 600' over even/uneven terrain with LRAD, at MOD I level, including traversing curbs to improve safety during functional mobility.     Status  New      PT LONG TERM GOAL #4   Title  Pt will amb. 100', at MOD I level (slower speed), over even terrain to amb. safely at home.     Status  New      PT LONG TERM GOAL #5   Title  Pt will improve walking distance during 6MWT to 694' to improve endurance.     Status  New            Plan - 10/04/17 1153    Clinical Impression Statement  Pt demonstrated progress as she met STGs 1 and 2. Pt did not meet STGs 3 and 4. PT will assess 6MWT test next session and STG 6 to assess endurance and gait. Pt required seated rest breaks during session 2/2 SOB and decr. SaO2 (84% after DGI), with incr. >92% with seated rest break and pursed lip  breathing. Pt would continue to benefit from skilled PT to improve safety during functional mobility.     Rehab Potential  Good    Clinical Impairments Affecting Rehab Potential  see above    PT Frequency  2x / week    PT Duration  8 weeks    PT Treatment/Interventions  ADLs/Self Care Home Management;Biofeedback;Therapeutic exercise;Manual techniques;Therapeutic activities;Functional mobility training;Stair training;Gait training;Patient/family education;Orthotic Fit/Training;DME Instruction;Neuromuscular re-education;Balance training;Vestibular    PT Next Visit Plan  assess STG's? (due date is 10-02-17 but pt has had limited attendance)    PT Home Exercise Plan  balance HEP given on 09-25-17    Consulted and Agree with Plan of Care  Patient       Patient will benefit from skilled therapeutic intervention in order to improve the following deficits and impairments:  Abnormal gait, Decreased endurance, Decreased knowledge of use of DME, Decreased balance, Decreased mobility, Impaired flexibility, Postural dysfunction, Decreased strength  Visit Diagnosis: Other abnormalities of gait and mobility  Unsteadiness on feet  Muscle weakness (generalized)     Problem List Patient Active Problem List   Diagnosis Date Noted  . Anemia, chronic disease 12/07/2016  . Chronic obstructive pulmonary disease ([place])   .  Chronic respiratory failure ([place]) 11/19/2016  . CKD (chronic kidney disease), stage III ([place]) 11/19/2016  . Diabetes mellitus type 2, uncontrolled ([place]) 11/19/2016  . Recurrent pneumonia 11/19/2016  . Acute on chronic diastolic heart failure ([place]) 11/19/2016  . Pulmonary HTN ([place]) 11/19/2016  . HLD (hyperlipidemia) 11/19/2016  . Hiatal hernia 05/01/2016  . History of bacterial pneumonia 05/01/2016  . COPD exacerbation ([place]) 11/18/2015  . Physical deconditioning 07/24/2013  . S/P AVR (aortic valve replacement) 07/23/2013  . S/P CABG x 1 07/23/2013  . Aortic stenosis 06/10/2013  .  Essential hypertension 06/10/2013  . COPD GOLD II  10/26/2011    Josuha Fontanez L 10/04/2017, 11:57 AM  [facility] [address] [place], [place], [address] Phone: [phone]   Fax:  [phone]  Name: [name] MRN: [id number] Date of Birth: May 19, 1932  Geoffry Paradise, PT,DPT 10/04/17 11:58 AM Phone: [phone] Fax: [phone]

## 2017-10-09 ENCOUNTER — Ambulatory Visit (HOSPITAL_COMMUNITY)
Admission: RE | Admit: 2017-10-09 | Discharge: 2017-10-09 | Disposition: A | Discharge status: Home / Self Care | Payer: Medicare Other | Source: Ambulatory Visit | Attending: Internal Medicine | Admitting: Internal Medicine

## 2017-10-09 ENCOUNTER — Ambulatory Visit: Payer: Medicare Other

## 2017-10-09 VITALS — HR 71

## 2017-10-09 DIAGNOSIS — I2510 Atherosclerotic heart disease of native coronary artery without angina pectoris: Secondary | ICD-10-CM | POA: Diagnosis not present

## 2017-10-09 DIAGNOSIS — K5730 Diverticulosis of large intestine without perforation or abscess without bleeding: Secondary | ICD-10-CM | POA: Diagnosis not present

## 2017-10-09 DIAGNOSIS — K449 Diaphragmatic hernia without obstruction or gangrene: Secondary | ICD-10-CM | POA: Insufficient documentation

## 2017-10-09 DIAGNOSIS — I700 Atherosclerosis of aorta: Secondary | ICD-10-CM | POA: Insufficient documentation

## 2017-10-09 DIAGNOSIS — R911 Solitary pulmonary nodule: Secondary | ICD-10-CM | POA: Diagnosis present

## 2017-10-09 DIAGNOSIS — R2689 Other abnormalities of gait and mobility: Secondary | ICD-10-CM

## 2017-10-09 DIAGNOSIS — R195 Other fecal abnormalities: Secondary | ICD-10-CM | POA: Diagnosis not present

## 2017-10-09 DIAGNOSIS — M6281 Muscle weakness (generalized): Secondary | ICD-10-CM

## 2017-10-09 DIAGNOSIS — R918 Other nonspecific abnormal finding of lung field: Secondary | ICD-10-CM | POA: Insufficient documentation

## 2017-10-09 DIAGNOSIS — K639 Disease of intestine, unspecified: Secondary | ICD-10-CM | POA: Diagnosis not present

## 2017-10-09 DIAGNOSIS — K8020 Calculus of gallbladder without cholecystitis without obstruction: Secondary | ICD-10-CM | POA: Diagnosis not present

## 2017-10-09 DIAGNOSIS — R2681 Unsteadiness on feet: Secondary | ICD-10-CM

## 2017-10-09 LAB — GLUCOSE, CAPILLARY: GLUCOSE-CAPILLARY: 151 mg/dL — AB (ref 65–99)

## 2017-10-09 MED ORDER — FLUDEOXYGLUCOSE F - 18 (FDG) INJECTION
4.9900 | Freq: Once | INTRAVENOUS | Status: AC | PRN
  Administered 2017-10-09: 4.99 via INTRAVENOUS

## 2017-10-09 NOTE — Therapy (Addendum)
[facility] [address] [place], [place], [address] Phone: [phone]   Fax:  [phone]  Physical Therapy Treatment  Patient Details  Name: [name] MRN: [id number] Date of Birth: 14-Mar-1932 Referring Provider: Dr. Dema Severin   Encounter Date: 10/09/2017  PT End of Session - 10/09/17 1150    Visit Number  6    Number of Visits  17    Date for PT Re-Evaluation  11/03/17    Authorization Type  [employer] Medicare: G-CODE AND PN EVERY 10TH VISIT.     PT Start Time  1102    PT Stop Time  1144    PT Time Calculation (min)  42 min    Equipment Utilized During Treatment  -- S prn    Activity Tolerance  Patient tolerated treatment well    Behavior During Therapy  [facility] for tasks assessed/performed       Past Medical History:  Diagnosis Date  . Anxiety   . Aortic stenosis   . Arthritis   . Asthma    uses inhaler as needed  . Depression   . Diabetes mellitus   . Emphysema of lung ([place])   . GERD (gastroesophageal reflux disease)   . Glaucoma   . Hyperlipidemia   . Hypertension   . [name]   . Murmur   . Nephrolithiasis     Past Surgical History:  Procedure Laterality Date  . ABDOMINAL HYSTERECTOMY    . ANKLE SURGERY  1998   d/t fx.  Left ankle  . ANTERIOR AND POSTERIOR REPAIR  06/21/2011   Procedure: ANTERIOR (CYSTOCELE) AND POSTERIOR REPAIR (RECTOCELE);  Surgeon: Maisie Fus;  Location: [facility];  Service: Gynecology;  Laterality: N/A;  . AORTIC VALVE REPLACEMENT N/A 07/17/2013   Procedure: AORTIC VALVE REPLACEMENT (AVR);  Surgeon: Ivin Poot, MD;  Location: [facility];  Service: Open Heart Surgery;  Laterality: N/A;  . CARDIAC CATHETERIZATION    . CARDIAC VALVE REPLACEMENT    . CORONARY ANGIOPLASTY    . CORONARY ARTERY BYPASS GRAFT N/A 07/17/2013   Procedure: CORONARY ARTERY BYPASS GRAFT, TIMES ONE, ON PUMP, USING RIGHT INTERNAL MAMMARY ARTERY.;  Surgeon: Ivin Poot, MD;  Location:  [facility];  Service: Open Heart Surgery;  Laterality: N/A;  . FACIAL COSMETIC SURGERY     face lift  . INTRAOPERATIVE TRANSESOPHAGEAL ECHOCARDIOGRAM N/A 07/17/2013   Procedure: INTRAOPERATIVE TRANSESOPHAGEAL ECHOCARDIOGRAM;  Surgeon: Ivin Poot, MD;  Location: [facility];  Service: Open Heart Surgery;  Laterality: N/A;  . LAPAROSCOPIC ASSISTED VAGINAL HYSTERECTOMY  06/21/2011   Procedure: LAPAROSCOPIC ASSISTED VAGINAL HYSTERECTOMY;  Surgeon: Maisie Fus;  Location: [facility];  Service: Gynecology;  Laterality: N/A;  . LEFT AND RIGHT HEART CATHETERIZATION WITH CORONARY ANGIOGRAM N/A 06/27/2013   Procedure: LEFT AND RIGHT HEART CATHETERIZATION WITH CORONARY ANGIOGRAM;  Surgeon: Ramond Dial, MD;  Location[facility] CATH LAB;  Service: Cardiovascular;  Laterality: N/A;  . NOSE SURGERY    . SALPINGOOPHORECTOMY  06/21/2011   Procedure: SALPINGO OOPHERECTOMY;  Surgeon: Maisie Fus;  Location: [facility];  Service: Gynecology;  Laterality: Bilateral;  . VAGINAL PROLAPSE REPAIR  06/21/2011   Procedure: SACROPEXY;  Surgeon: Maisie Fus;  Location: [facility];  Service: Gynecology;  Laterality: N/A;  sacrospinous ligament suspension    Vitals:   10/09/17 1116  Pulse: 71  SpO2: 91%    Subjective Assessment - 10/09/17 1105    Subjective  Pt denied falls since last visit  but reported she had some close calls when amb. with SPC/without AD, she had to grab the wall to remain steady. Pt reported she had a PET scan this morning, MD is checking for lung CA.     Pertinent History  2L of SpO2, osteoporosis, COPD, aortic valve replacement, HTN, HLD, DM, depression, diverticulosis, incontinence, glaucoma, 02/2011: 40-50% carotid stenosis, emphysema, CKD stage 3, CAD, hiatal hernia    Patient Stated Goals  She'd love to stop using SpO2 as it is heavy, she'd like to walk without an AD.    Currently in Pain?  No/denies                      [facility] Adult PT Treatment/Exercise - 10/09/17 1107       Ambulation/Gait   Ambulation/Gait  Yes    Ambulation/Gait Assistance  6: Modified independent (Device/Increase time);5: Supervision    Ambulation/Gait Assistance Details  Pt amb. with improved upright posture and heel strike. Pt's SaO2 on 2L of O2 remained >/=91% during and after amb. and standing exercises. S over carpeted surfaces to avoid obstacles and to improve heels strike.     Ambulation Distance (Feet)  575 Feet 230'x3    Assistive device  Rollator    Gait Pattern  Step-through pattern;Decreased stride length;Decreased trunk rotation    Ambulation Surface  Level;Indoor      Exercises   Exercises  Knee/Hip      Knee/Hip Exercises: Aerobic   Other Aerobic  SciFit: level 1 with all 4 extremities to improve strength and endurance. 5 minutes with RPMS >40. Distance: 0.15 miles.       Knee/Hip Exercises: Standing   Heel Raises  Both;1 set;20 reps    Heel Raises Limitations  1 UE for support, as pt experienced 1 LOB and required min A to maintain balance.     Functional Squat  3 sets;10 reps    Functional Squat Limitations  Cues for technique.     Other Standing Knee Exercises  x20 reps of toe raises with 1 UE support and cues for technique. Performed with S for safety.      Seated rest breaks required during session 2/2 fatigue.        PT Education - 10/09/17 1149    Education provided  Yes    Education Details  PT again reiterated the importance of using rollator at all times and that we will practice with [facility] here but not to perform at home 2/2 safety.     Person(s) Educated  Patient    Methods  Explanation    Comprehension  Verbalized understanding       PT Short Term Goals - 10/09/17 1154      PT SHORT TERM GOAL #1   Title  Pt will be IND in HEP to improve balance, endurance, strength, and flexibility. TARGET DATE FOR ALL STGS: 10/02/17    Status  Achieved      PT SHORT TERM GOAL #2   Title  Pt will improve DGI score to >/=16/24 to decr. falls risk.     Status   Achieved      PT SHORT TERM GOAL #3   Title  Pt will improve gait speed with LRAD, not holding SpO2 machine to >/=2.62 ft/sec. to safely amb. in the community.     Status  Not Met      PT SHORT TERM GOAL #4   Title  Pt will report zero falls over the last 2  weeks to improve safety.     Status  Not Met      PT SHORT TERM GOAL #5   Title  Perform 6MWT and write goals as indicated.     Status  Achieved      PT SHORT TERM GOAL #6   Title  Pt will amb. 500' with LRAD, not carrying SpO2 machine, at MOD I level to improve safety during functional mobility.     Status  Achieved        PT Long Term Goals - 09/12/17 1154      PT LONG TERM GOAL #1   Title  Pt will verbalize understanding fall prevention strategies to reduce falls risk. TARGET DATE FOR ALL LTGS: 10/30/17    Status  New      PT LONG TERM GOAL #2   Title  Pt will improve DGI score to >/=20/24 to decr. falls risk.     Status  New      PT LONG TERM GOAL #3   Title  Pt will amb. 600' over even/uneven terrain with LRAD, at MOD I level, including traversing curbs to improve safety during functional mobility.     Status  New      PT LONG TERM GOAL #4   Title  Pt will amb. 100', at MOD I level (slower speed), over even terrain to amb. safely at home.     Status  New      PT LONG TERM GOAL #5   Title  Pt will improve walking distance during 6MWT to 694' to improve endurance.     Status  New            Plan - 10/09/17 1150    Clinical Impression Statement  Pt demonstrated progress as she was able to perform amb. and standing exercises with SaO2 on 2L of O2 remaining >/=91%. Pt's O2 did decr. to 84% while performing SciFit and talking, but quickly incr. to >/=92% with pursed lip breathing and rest. PT had pt perform circuit training to improve endurance and strength today. Pt would continue to benefit from skilled PT to improve safety during functional mobility.    Rehab Potential  Good    Clinical Impairments Affecting  Rehab Potential  see above    PT Frequency  2x / week    PT Duration  8 weeks    PT Treatment/Interventions  ADLs/Self Care Home Management;Biofeedback;Therapeutic exercise;Manual techniques;Therapeutic activities;Functional mobility training;Stair training;Gait training;Patient/family education;Orthotic Fit/Training;DME Instruction;Neuromuscular re-education;Balance training;Vestibular    PT Next Visit Plan  walk with [facility] and without device. Circuit training for gait, endurance, strength, and balance.    PT Home Exercise Plan  balance HEP given on 09-25-17    Consulted and Agree with Plan of Care  Patient       Patient will benefit from skilled therapeutic intervention in order to improve the following deficits and impairments:  Abnormal gait, Decreased endurance, Decreased knowledge of use of DME, Decreased balance, Decreased mobility, Impaired flexibility, Postural dysfunction, Decreased strength  Visit Diagnosis: Other abnormalities of gait and mobility  Muscle weakness (generalized)  Unsteadiness on feet     Problem List Patient Active Problem List   Diagnosis Date Noted  . Anemia, chronic disease 12/07/2016  . Chronic obstructive pulmonary disease ([place])   . Chronic respiratory failure ([place]) 11/19/2016  . CKD (chronic kidney disease), stage III ([place]) 11/19/2016  . Diabetes mellitus type 2, uncontrolled ([place]) 11/19/2016  . Recurrent pneumonia 11/19/2016  . Acute on chronic  diastolic heart failure ([place]) 11/19/2016  . Pulmonary HTN ([place]) 11/19/2016  . HLD (hyperlipidemia) 11/19/2016  . Hiatal hernia 05/01/2016  . History of bacterial pneumonia 05/01/2016  . COPD exacerbation ([place]) 11/18/2015  . Physical deconditioning 07/24/2013  . S/P AVR (aortic valve replacement) 07/23/2013  . S/P CABG x 1 07/23/2013  . Aortic stenosis 06/10/2013  . Essential hypertension 06/10/2013  . COPD GOLD II  10/26/2011    , L 10/09/2017, 11:57 AM  [facility] [address] [place], [place], [address] Phone: [phone]   Fax:  [phone]  Name: ALEXINE PILANT MRN: [id number] Date of Birth: [birth year]  Geoffry Paradise, PT,DPT 10/09/17 11:59 AM Phone: [phone] Fax: [phone]

## 2017-10-11 ENCOUNTER — Telehealth: Payer: Self-pay | Admitting: Internal Medicine

## 2017-10-11 ENCOUNTER — Ambulatory Visit: Payer: Medicare Other

## 2017-10-11 VITALS — HR 62

## 2017-10-11 DIAGNOSIS — R2681 Unsteadiness on feet: Secondary | ICD-10-CM

## 2017-10-11 DIAGNOSIS — M6281 Muscle weakness (generalized): Secondary | ICD-10-CM

## 2017-10-11 DIAGNOSIS — R2689 Other abnormalities of gait and mobility: Secondary | ICD-10-CM | POA: Diagnosis not present

## 2017-10-11 NOTE — Telephone Encounter (Signed)
She has PET hot nodule but no follow up appt to see me. Please schedule one ASAP next week or two  Dr. Brand Males, M.D., [facility].C.P Pulmonary and Critical Care Medicine Staff Physician, [place] Director - Interstitial Lung Disease  Program  Pulmonary [place], [place], [address]  Pager: [phone], If no answer or between  15:00h - 7:00h: call [phone] Telephone: [phone]   Nm Pet Image Initial (pi) Skull Base To Thigh  Result Date: 10/09/2017 CLINICAL DATA:  Initial treatment strategy for left lower lobe pulmonary nodule. EXAM: NUCLEAR MEDICINE PET SKULL BASE TO THIGH TECHNIQUE: 5.0 mCi F-18 FDG was injected intravenously. Full-ring PET imaging was performed from the skull base to thigh after the radiotracer. CT data was obtained and used for attenuation correction and anatomic localization. FASTING BLOOD GLUCOSE:  Value: 151 mg/dl COMPARISON:  Chest CT 08/20/2017 FINDINGS: NECK No hypermetabolic lymph nodes in the neck. Bilateral carotid atherosclerotic calcifications noted. 10 mm hypodense lesion inferiorly in the right thyroid lobe has no accentuated metabolic activity. CHEST The pleural-based spiculated lesion of concern in the left lower lobe measuring 1.2 by 1.0 cm on image 49/8 has a maximum SUV of 5.3, compatible with malignancy. No hypermetabolic adenopathy in the chest. The previous nodularity seen just above the right hemidiaphragm is not readily apparent on today's PET-CT, and on the recent CT scan was below sensitive PET-CT size thresholds. Severe centrilobular emphysema. Coronary, aortic arch, and branch vessel atherosclerotic vascular disease. Prior CABG. Moderate-sized hiatal hernia. ABDOMEN/PELVIS No abnormal hypermetabolic activity within the liver, pancreas, adrenal glands, or spleen. No hypermetabolic lymph nodes in the abdomen or pelvis. Cholelithiasis noted. Aortoiliac atherosclerotic  vascular disease. Prominent stool throughout the colon favors constipation. Descending and sigmoid colon diverticulosis. Questionable wall thickening in proximal loops of small bowel without hypermetabolic activity. SKELETON Unremarkable IMPRESSION: 1. Pleural-based spiculated nodule of concern in the left lower lobe has a maximum SUV of 5.3, compatible with malignancy. Currently no pleural effusion or findings of pleural metastatic spread, although clearly the position of this lesion may make it more risky for early pleural metastatic spread. No appreciable adenopathy. 2. The previous tiny right pulmonary nodule along the right hemidiaphragm is not well seen today, and is below sensitive PET-CT size thresholds. 3. Other imaging findings of potential clinical significance: Aortic Atherosclerosis (ICD10-I70.0) and Emphysema (ICD10-J43.9). Coronary atherosclerosis. Cholelithiasis. Prominent stool throughout the colon favors constipation. Descending and sigmoid colon diverticulosis. Questionable wall thickening in proximal loops of small bowel, query mild proximal enteritis. Moderate-sized hiatal hernia. Electronically Signed   By: Van Clines M.D.   On: 10/09/2017 09:56

## 2017-10-11 NOTE — Therapy (Signed)
[facility] [address] [place], [place], [address] Phone: [phone]   Fax:  [phone]  Physical Therapy Treatment  Patient Details  Name: [name] MRN: [id number] Date of Birth: 1932/04/22 Referring Provider: Dr. Dema Severin   Encounter Date: 10/11/2017  PT End of Session - 10/11/17 1140    Visit Number  7    Number of Visits  17    Date for PT Re-Evaluation  11/03/17    Authorization Type  [employer] Medicare: G-CODE AND PN EVERY 10TH VISIT.     PT Start Time  1104    PT Stop Time  1144    PT Time Calculation (min)  40 min    Equipment Utilized During Treatment  -- min guard to S prn    Activity Tolerance  Patient tolerated treatment well    Behavior During Therapy  WFL for tasks assessed/performed       Past Medical History:  Diagnosis Date  . Anxiety   . Aortic stenosis   . Arthritis   . Asthma    uses inhaler as needed  . Depression   . Diabetes mellitus   . Emphysema of lung ([place])   . GERD (gastroesophageal reflux disease)   . Glaucoma   . Hyperlipidemia   . Hypertension   . [name]   . Murmur   . Nephrolithiasis     Past Surgical History:  Procedure Laterality Date  . ABDOMINAL HYSTERECTOMY    . ANKLE SURGERY  1998   d/t fx.  Left ankle  . ANTERIOR AND POSTERIOR REPAIR  06/21/2011   Procedure: ANTERIOR (CYSTOCELE) AND POSTERIOR REPAIR (RECTOCELE);  Surgeon: Maisie Fus;  Location: [facility];  Service: Gynecology;  Laterality: N/A;  . AORTIC VALVE REPLACEMENT N/A 07/17/2013   Procedure: AORTIC VALVE REPLACEMENT (AVR);  Surgeon: Ivin Poot, MD;  Location: [facility];  Service: Open Heart Surgery;  Laterality: N/A;  . CARDIAC CATHETERIZATION    . CARDIAC VALVE REPLACEMENT    . CORONARY ANGIOPLASTY    . CORONARY ARTERY BYPASS GRAFT N/A 07/17/2013   Procedure: CORONARY ARTERY BYPASS GRAFT, TIMES ONE, ON PUMP, USING RIGHT INTERNAL MAMMARY ARTERY.;  Surgeon: Ivin Poot,  MD;  Location: [facility];  Service: Open Heart Surgery;  Laterality: N/A;  . FACIAL COSMETIC SURGERY     face lift  . INTRAOPERATIVE TRANSESOPHAGEAL ECHOCARDIOGRAM N/A 07/17/2013   Procedure: INTRAOPERATIVE TRANSESOPHAGEAL ECHOCARDIOGRAM;  Surgeon: Ivin Poot, MD;  Location: [facility];  Service: Open Heart Surgery;  Laterality: N/A;  . LAPAROSCOPIC ASSISTED VAGINAL HYSTERECTOMY  06/21/2011   Procedure: LAPAROSCOPIC ASSISTED VAGINAL HYSTERECTOMY;  Surgeon: Maisie Fus;  Location: [facility];  Service: Gynecology;  Laterality: N/A;  . LEFT AND RIGHT HEART CATHETERIZATION WITH CORONARY ANGIOGRAM N/A 06/27/2013   Procedure: LEFT AND RIGHT HEART CATHETERIZATION WITH CORONARY ANGIOGRAM;  Surgeon: Ramond Dial, MD;  Location: [facility];  Service: Cardiovascular;  Laterality: N/A;  . NOSE SURGERY    . SALPINGOOPHORECTOMY  06/21/2011   Procedure: SALPINGO OOPHERECTOMY;  Surgeon: Maisie Fus;  Location: [facility];  Service: Gynecology;  Laterality: Bilateral;  . VAGINAL PROLAPSE REPAIR  06/21/2011   Procedure: SACROPEXY;  Surgeon: Maisie Fus;  Location: [facility];  Service: Gynecology;  Laterality: N/A;  sacrospinous ligament suspension    Vitals:   10/11/17 1108  Pulse: 62  SpO2: 94%    Subjective Assessment - 10/11/17 1107    Subjective  Pt denied falls  or changes since last visit. Pt brought in updated medication list and PT verified it.    Pertinent History  2L of SpO2, osteoporosis, COPD, aortic valve replacement, HTN, HLD, DM, depression, diverticulosis, incontinence, glaucoma, 02/2011: 40-50% carotid stenosis, emphysema, CKD stage 3, CAD, hiatal hernia    Patient Stated Goals  She'd love to stop using SpO2 as it is heavy, she'd like to walk without an AD.    Currently in Pain?  No/denies                      [facility] Adult PT Treatment/Exercise - 10/11/17 1110      Transfers   Transfers  Sit to Stand    Sit to Stand  5: Supervision;Without upper extremity assist;From  chair/3-in-1    Sit to Stand Details  Verbal cues for sequencing;Verbal cues for technique    Sit to Stand Details (indicate cue type and reason)  Cues to improve upright posture and B knee ext    Stand to Sit  5: Supervision;Without upper extremity assist;To chair/3-in-1    Stand to Sit Details  Cues to improve eccentric control    Number of Reps  10 reps      Ambulation/Gait   Ambulation/Gait  Yes    Ambulation/Gait Assistance  5: Supervision;4: Min guard    Ambulation/Gait Assistance Details  Pt. amb. without AD with cues to improve heel strike and to keep improve narrow BOS to improve balance. PT monitored pt's SaO2 on 2L of O2, PT carried pt's O2 machine. Pt's SaO2 remained >90% during amb. Pt noted to experience incr. postural sway with narrow BOS.     Ambulation Distance (Feet)  230 Feet x3    Assistive device  None    Gait Pattern  Step-through pattern;Decreased stride length;Decreased trunk rotation    Ambulation Surface  Level;Indoor      Exercises   Exercises  Knee/Hip      Knee/Hip Exercises: Aerobic   Other Aerobic  SciFit: level 2 with all 4 extremities to improve strength and endurance. 5 minutes with RPMS >40. Distance: 0.15 miles.       Knee/Hip Exercises: Standing   Heel Raises  Both;1 set;20 reps    Heel Raises Limitations  1 UE for support, as pt experienced 1 LOB and required min A to maintain balance.  heel and toe raises. Pt then also performed SLS on each LE.    Functional Squat  3 sets;10 reps    Functional Squat Limitations  Cues for technique.     Other Standing Knee Exercises  All performed with S and cues for technique.                PT Short Term Goals - 10/09/17 1154      PT SHORT TERM GOAL #1   Title  Pt will be IND in HEP to improve balance, endurance, strength, and flexibility. TARGET DATE FOR ALL STGS: 10/02/17    Status  Achieved      PT SHORT TERM GOAL #2   Title  Pt will improve DGI score to >/=16/24 to decr. falls risk.     Status   Achieved      PT SHORT TERM GOAL #3   Title  Pt will improve gait speed with LRAD, not holding SpO2 machine to >/=2.62 ft/sec. to safely amb. in the community.     Status  Not Met      PT SHORT TERM GOAL #4  Title  Pt will report zero falls over the last 2 weeks to improve safety.     Status  Not Met      PT SHORT TERM GOAL #5   Title  Perform 6MWT and write goals as indicated.     Status  Achieved      PT SHORT TERM GOAL #6   Title  Pt will amb. 500' with LRAD, not carrying SpO2 machine, at MOD I level to improve safety during functional mobility.     Status  Achieved        PT Long Term Goals - 09/12/17 1154      PT LONG TERM GOAL #1   Title  Pt will verbalize understanding fall prevention strategies to reduce falls risk. TARGET DATE FOR ALL LTGS: 10/30/17    Status  New      PT LONG TERM GOAL #2   Title  Pt will improve DGI score to >/=20/24 to decr. falls risk.     Status  New      PT LONG TERM GOAL #3   Title  Pt will amb. 600' over even/uneven terrain with LRAD, at MOD I level, including traversing curbs to improve safety during functional mobility.     Status  New      PT LONG TERM GOAL #4   Title  Pt will amb. 100', at MOD I level (slower speed), over even terrain to amb. safely at home.     Status  New      PT LONG TERM GOAL #5   Title  Pt will improve walking distance during 6MWT to 694' to improve endurance.     Status  New            Plan - 10/11/17 1142    Clinical Impression Statement  Pt demonstrated progress as SaO2 on 2L of O2 remained >/=90% during session today. Pt's rest breaks were approx. 30 sec. vs. 1-2 minutes prior to next exercise, indicating improved endurance. Pt experienced incr. postural sway without AD. Continue with POC.     Rehab Potential  Good    Clinical Impairments Affecting Rehab Potential  see above    PT Frequency  2x / week    PT Duration  8 weeks    PT Treatment/Interventions  ADLs/Self Care Home  Management;Biofeedback;Therapeutic exercise;Manual techniques;Therapeutic activities;Functional mobility training;Stair training;Gait training;Patient/family education;Orthotic Fit/Training;DME Instruction;Neuromuscular re-education;Balance training;Vestibular    PT Next Visit Plan  assess STG's? (due date is 10-02-17 but pt has had limited attendance)    PT Home Exercise Plan  balance HEP given on 09-25-17    Consulted and Agree with Plan of Care  Patient       Patient will benefit from skilled therapeutic intervention in order to improve the following deficits and impairments:  Abnormal gait, Decreased endurance, Decreased knowledge of use of DME, Decreased balance, Decreased mobility, Impaired flexibility, Postural dysfunction, Decreased strength  Visit Diagnosis: Other abnormalities of gait and mobility  Muscle weakness (generalized)  Unsteadiness on feet     Problem List Patient Active Problem List   Diagnosis Date Noted  . Anemia, chronic disease 12/07/2016  . Chronic obstructive pulmonary disease ([place])   . Chronic respiratory failure ([place]) 11/19/2016  . CKD (chronic kidney disease), stage III ([place]) 11/19/2016  . Diabetes mellitus type 2, uncontrolled ([place]) 11/19/2016  . Recurrent pneumonia 11/19/2016  . Acute on chronic diastolic heart failure ([place]) 11/19/2016  . Pulmonary HTN ([place]) 11/19/2016  . HLD (hyperlipidemia) 11/19/2016  .  Hiatal hernia 05/01/2016  . History of bacterial pneumonia 05/01/2016  . COPD exacerbation ([place]) 11/18/2015  . Physical deconditioning 07/24/2013  . S/P AVR (aortic valve replacement) 07/23/2013  . S/P CABG x 1 07/23/2013  . Aortic stenosis 06/10/2013  . Essential hypertension 06/10/2013  . COPD GOLD II  10/26/2011    Irving Lubbers L 10/11/2017, 11:53 AM  [facility] [address] [place], [place], [address] Phone: [phone]   Fax:  [phone]  Name: IKHLAS ALBO MRN:  [id number] Date of Birth: 05-Jan-1932  Geoffry Paradise, PT,DPT 10/11/17 11:53 AM Phone: [phone] Fax: [phone]

## 2017-10-12 NOTE — Telephone Encounter (Signed)
appt changed. Pt seeing Eric Form, NP Monday, 10/15/17 at 11:00.  Nothing further needed at this current time.

## 2017-10-15 ENCOUNTER — Telehealth: Payer: Self-pay | Admitting: Acute Care

## 2017-10-15 ENCOUNTER — Encounter: Payer: Self-pay | Admitting: Acute Care

## 2017-10-15 ENCOUNTER — Ambulatory Visit (INDEPENDENT_AMBULATORY_CARE_PROVIDER_SITE_OTHER): Payer: Medicare Other | Admitting: Acute Care

## 2017-10-15 VITALS — BP 140/64 | HR 66 | Ht 60.0 in | Wt 104.0 lb

## 2017-10-15 DIAGNOSIS — J9611 Chronic respiratory failure with hypoxia: Secondary | ICD-10-CM | POA: Diagnosis not present

## 2017-10-15 DIAGNOSIS — J449 Chronic obstructive pulmonary disease, unspecified: Secondary | ICD-10-CM | POA: Diagnosis not present

## 2017-10-15 DIAGNOSIS — R911 Solitary pulmonary nodule: Secondary | ICD-10-CM | POA: Diagnosis not present

## 2017-10-15 NOTE — Assessment & Plan Note (Signed)
Continue oxygen therapy at 2 L [place]. Saturation goals are 88-92%

## 2017-10-15 NOTE — Assessment & Plan Note (Addendum)
Moderate COPD with emphysema Not using DuoNebs Not using Flovent ? Med compliance Plan: We will place an order for PFT's Please continue your Duonebs 3 mls through your nebulizer machine four times a day. This is your maintenance medication Use your albuterol nebs as needed for shortness of breath or wheezing. Continue Flovent 2 puffs twice daily Or use your albuterol inhaler as needed for shortness of breath or wheezing.  Consider pulmonary rehab Follow up with Dr. Chase Caller or Judson Roch NP in 3 weeks. Please contact office for sooner follow up if symptoms do not improve or worsen or seek emergency care

## 2017-10-15 NOTE — Telephone Encounter (Signed)
Ok, would you like labs to be added?

## 2017-10-15 NOTE — Telephone Encounter (Signed)
Please place an order for referral to IR for biopsy of LLL sub pleural pulmonary nodule. Please call patient and let her know Dr. Lamonte Sakai reviewed her scan and felt this was the best option for biopsy  based on the location of the nodule.Thanks so much.

## 2017-10-15 NOTE — Assessment & Plan Note (Addendum)
New spiculated 13 x 18 mm subpleural nodule left lower lobe per CT 08/20/2017. Pleural-based spiculated nodule of concern in the left lower lobe has a maximum SUV of 5.3, compatible with malignancy per PET 10/09/2017 Will need tissue typing via biopsy Concerned with significant pulmonary disease that options for biopsy and treatment will be limited. Plan: Referral to IR for biopsy of LLL new spiculated pulmonary nodule concerning for malignancy Pleural-based spiculated nodule of concern in the left lower lobe has a maximum SUV of 5.3, compatible with malignancy. We will place an order for PFT's We will have Dr. Lamonte Sakai review your PET scan and decide the best options for biopsy. IR referral for biopsy of LLL mass. Follow up after procedure with Dr. Chase Caller with CXR .

## 2017-10-15 NOTE — Patient Instructions (Addendum)
It is nice to meet you today. We will place an order for PFT's We will have Dr. Lamonte Sakai review your PET scan and decide the best options for biopsy. We will call you with plan for biopsy. Please continue your Duonebs 3 mls through your nebulizer machine four times a day. This is your maintenance medication Use your albuterol nebs as needed for shortness of breath or wheezing. Or use your albuterol inhaler as needed for shortness of breath or wheezing. Follow up with Dr. Chase Caller or Judson Roch NP in 3 weeks. Please contact office for sooner follow up if symptoms do not improve or worsen or seek emergency care

## 2017-10-15 NOTE — Progress Notes (Signed)
History of Present Illness [name] is a 84 y.o. female former smoker ( 30 pack year history, quit 1974). She is followed by Dr. Chase Caller.  Synopsis: 84 year old female with chronic COPD and chronic hypoxemic respiratory failure on 2 L oxygen.  She has abnormal chest x-ray on account of pneumonia in 2017 CT scan of the chest and a chest x-ray 1 in 2018 was abnormal.   10/15/2017 Follow up PET scan per Dr. Chase Caller: Pt presents for follow up. She was seen by Dr. Chase Caller 08/16/2017. CAT score at that time was 15. Plan of care at that time was as follows: Plan and Follow-up - Continue DuoNeb 4 times daily and then Flovent inhaler 2 times daily - Continue oxygen - Do CT chest without contrast  Next few weeks for followup of pneumonia changes on CT in 2017 and abnormal CXR  In 2018; will call with results  Followup  - March 2019 or sooner if needed  Pt. Subsequently had CT Chest which showed a New spiculated 13 x 18 mm subpleural nodule left lower lobe. This was followed  with a PET scan per Dr. Chase Caller , which showed a  Pleural-based spiculated nodule of concern in the left lower lobe has a maximum SUV of 5.3, compatible with malignancy.Pt. Is here today to discuss the results of the PET scan. She states she is at her baseline COPD status. She continues to have falls.She is not using her DuoNebs as she does not think her neb machine is working properly. She states she is not using her Flovent daily. Therefore she is currently not on maintenance therapy. She is using her rescue inhaler as needed.She denies purulent secretions. She denies fever, chest pain, orthopnea or hemoptysis. We reviewed her PET scan and her CT and I explained the possibility of malignancy vs another process. She understands need for tissue sampling.   Test Results: PET 10/09/2017 1. Pleural-based spiculated nodule of concern in the left lower lobe has a maximum SUV of 5.3, compatible with malignancy. Currently  no pleural effusion or findings of pleural metastatic spread, although clearly the position of this lesion may make it more risky for early pleural metastatic spread. No appreciable adenopathy. 2. The previous tiny right pulmonary nodule along the right hemidiaphragm is not well seen today, and is below sensitive PET-CT size thresholds. 3. Other imaging findings of potential clinical significance: Aortic Atherosclerosis (ICD10-I70.0) and Emphysema (ICD10-J43.9). Coronary atherosclerosis. Cholelithiasis. Prominent stool throughout the colon favors constipation. Descending and sigmoid colon diverticulosis. Questionable wall thickening in proximal loops of small bowel, query mild proximal enteritis. Moderate-sized hiatal Hernia.  08/20/2017 CT Chest w/o IMPRESSION: 1. New spiculated 13 x 18 mm subpleural nodule left lower lobe. While this may potentially represent a focal pneumonia, neoplasm is certainly a concern, given the reported clinical history of left rib pain. Consider follow-up chest CT in 3 months to assess for resolution. If the lesion is persistent at that time, PET-CT may be warranted to further evaluate. 2. Emphysema with bullous change in the upper lungs. 3. Enlargement main pulmonary artery suggests pulmonary arterial Hypertension.  PFT's 2016: Pulmonary Function Diagnosis: Obstructive Airways Disease- severe based on FEV1/ FVC ratio Insignificant response to bronchodilator Severe Diffusion Defect Emphysema pattern with consistent loop contour  CBC Latest Ref Rng & Units 03/02/2017 12/07/2016 11/23/2016  WBC 4.0 - 10.5 K/uL 2.9(L) 9.5 7.8  Hemoglobin 12.0 - 15.0 g/dL 10.7(L) 11.3(L) 10.2(L)  Hematocrit 36.0 - 46.0 % 35.3(L) 35.4(L) 33.5(L)  Platelets 150 -  400 K/uL 122(L) 215.0 220    BMP Latest Ref Rng & Units 03/02/2017 12/07/2016 11/23/2016  Glucose 65 - 99 mg/dL 127(H) 329(H) 216(H)  BUN 6 - 20 mg/dL 51(H) 32(H) 43(H)  Creatinine 0.44 - 1.00 mg/dL 1.52(H) 1.23(H)  1.34(H)  Sodium 135 - 145 mmol/L 135 139 137  Potassium 3.5 - 5.1 mmol/L 3.2(L) 3.6 4.2  Chloride 101 - 111 mmol/L 101 102 103  CO2 22 - 32 mmol/L 23 28 24   Calcium 8.9 - 10.3 mg/dL 8.9 9.0 8.8(L)    BNP    Component Value Date/Time   BNP 436.4 (H) 11/19/2016 0955    ProBNP    Component Value Date/Time   PROBNP 407.0 (H) 09/10/2013 1105    PFT    Component Value Date/Time   FEV1PRE 1.19 01/18/2015 0928   FEV1POST 1.20 01/18/2015 0928   FVCPRE 2.72 01/18/2015 0928   FVCPOST 2.72 01/18/2015 0928   TLC 4.82 01/18/2015 0928   DLCOUNC 6.11 01/18/2015 0928   PREFEV1FVCRT 44 01/18/2015 0928   PSTFEV1FVCRT 44 01/18/2015 0928    Nm Pet Image Initial (pi) Skull Base To Thigh  Result Date: 10/09/2017 CLINICAL DATA:  Initial treatment strategy for left lower lobe pulmonary nodule. EXAM: NUCLEAR MEDICINE PET SKULL BASE TO THIGH TECHNIQUE: 5.0 mCi F-18 FDG was injected intravenously. Full-ring PET imaging was performed from the skull base to thigh after the radiotracer. CT data was obtained and used for attenuation correction and anatomic localization. FASTING BLOOD GLUCOSE:  Value: 151 mg/dl COMPARISON:  Chest CT 08/20/2017 FINDINGS: NECK No hypermetabolic lymph nodes in the neck. Bilateral carotid atherosclerotic calcifications noted. 10 mm hypodense lesion inferiorly in the right thyroid lobe has no accentuated metabolic activity. CHEST The pleural-based spiculated lesion of concern in the left lower lobe measuring 1.2 by 1.0 cm on image 49/8 has a maximum SUV of 5.3, compatible with malignancy. No hypermetabolic adenopathy in the chest. The previous nodularity seen just above the right hemidiaphragm is not readily apparent on today's PET-CT, and on the recent CT scan was below sensitive PET-CT size thresholds. Severe centrilobular emphysema. Coronary, aortic arch, and branch vessel atherosclerotic vascular disease. Prior CABG. Moderate-sized hiatal hernia. ABDOMEN/PELVIS No abnormal  hypermetabolic activity within the liver, pancreas, adrenal glands, or spleen. No hypermetabolic lymph nodes in the abdomen or pelvis. Cholelithiasis noted. Aortoiliac atherosclerotic vascular disease. Prominent stool throughout the colon favors constipation. Descending and sigmoid colon diverticulosis. Questionable wall thickening in proximal loops of small bowel without hypermetabolic activity. SKELETON Unremarkable IMPRESSION: 1. Pleural-based spiculated nodule of concern in the left lower lobe has a maximum SUV of 5.3, compatible with malignancy. Currently no pleural effusion or findings of pleural metastatic spread, although clearly the position of this lesion may make it more risky for early pleural metastatic spread. No appreciable adenopathy. 2. The previous tiny right pulmonary nodule along the right hemidiaphragm is not well seen today, and is below sensitive PET-CT size thresholds. 3. Other imaging findings of potential clinical significance: Aortic Atherosclerosis (ICD10-I70.0) and Emphysema (ICD10-J43.9). Coronary atherosclerosis. Cholelithiasis. Prominent stool throughout the colon favors constipation. Descending and sigmoid colon diverticulosis. Questionable wall thickening in proximal loops of small bowel, query mild proximal enteritis. Moderate-sized hiatal hernia. Electronically Signed   By: Van Clines M.D.   On: 10/09/2017 09:56     Past medical hx Past Medical History:  Diagnosis Date  . Anxiety   . Aortic stenosis   . Arthritis   . Asthma    uses inhaler as needed  . Depression   .  Diabetes mellitus   . Emphysema of lung ([place])   . GERD (gastroesophageal reflux disease)   . Glaucoma   . Hyperlipidemia   . Hypertension   . [name]   . Murmur   . Nephrolithiasis      Social History   Tobacco Use  . Smoking status: Former Smoker    Packs/day: 2.00    Years: 15.00    Pack years: 30.00    Types: Cigarettes    Last attempt to quit: 09/18/1972     Years since quitting: 45.1  . Smokeless tobacco: Never Used  Substance Use Topics  . Alcohol use: No    Alcohol/week: 0.0 oz  . Drug use: No    Ms.Alcaraz reports that she quit smoking about 45 years ago. Her smoking use included cigarettes. She has a 30.00 pack-year smoking history. she has never used smokeless tobacco. She reports that she does not drink alcohol or use drugs.  Tobacco Cessation: Former smoker, Quit 1974  Past surgical hx, Family hx, Social hx all reviewed.  Current Outpatient Medications on File Prior to Visit  Medication Sig  . acetaminophen (TYLENOL) 500 MG tablet Take 500 mg by mouth every 6 (six) hours as needed for headache.  . albuterol (PROVENTIL) (2.5 MG/3ML) 0.083% nebulizer solution Take 3 mLs (2.5 mg total) by nebulization every 6 (six) hours as needed for wheezing.  [name] amLODipine (NORVASC) 10 MG tablet Take 1 tablet (10 mg total) by mouth daily.  [name] aspirin EC 81 MG tablet Take 81 mg by mouth at bedtime.  [name] buPROPion (WELLBUTRIN XL) 150 MG 24 hr tablet Take 150 mg by mouth daily.  . cholecalciferol (VITAMIN D) 1000 units tablet Take 1,000 Units by mouth at bedtime.   [name] FERREX 150 150 MG capsule Take 150 mg by mouth daily.  . fluticasone (FLOVENT HFA) 220 MCG/ACT inhaler Inhale 2 puffs into the lungs 2 (two) times daily.  . furosemide (LASIX) 40 MG tablet Take 1 tablet (40 mg total) by mouth every other day. (Patient taking differently: Take 20 mg by mouth every other day. )  . glimepiride (AMARYL) 4 MG tablet Take 4 mg by mouth daily with breakfast.   . ipratropium-albuterol (DUONEB) 0.5-2.5 (3) MG/3ML SOLN Take 3 mLs by nebulization 4 (four) times daily. Dx: J44.9  . irbesartan (AVAPRO) 150 MG tablet Take 1 tablet (150 mg total) by mouth daily.  . Magnesium 250 MG TABS Take 250 mg by mouth at bedtime.   . Multiple Vitamins-Minerals (OCUVITE-LUTEIN PO) Take 1 tablet by mouth every morning.   . pravastatin (PRAVACHOL) 40 MG tablet Take 40 mg by mouth  every evening.   [name] PROAIR HFA 108 (90 BASE) MCG/ACT inhaler Inhale 2 puffs into the lungs 4 (four) times daily as needed for wheezing or shortness of breath.   . sertraline (ZOLOFT) 100 MG tablet Take 1 tablet (100 mg total) by mouth every morning.   No current facility-administered medications on file prior to visit.      Allergies  Allergen Reactions  . Amaryl [Glimepiride]     Side effects  . Avelox [Moxifloxacin Hcl In Nacl]     nausea  . Ciprofloxacin     nausea  . Lipitor [Atorvastatin] Other (See Comments)    myalgias myalgias  . Lisinopril Other (See Comments)    cough cough  . Other Other (See Comments)    Increased cough  . Prevacid [Lansoprazole]     diarrhea  . Spiriva Handihaler [Tiotropium  Bromide Monohydrate]     Increased cough  . Symbicort [Budesonide-Formoterol Fumarate]     Increased cough    Review Of Systems:  Constitutional:   No  weight loss, night sweats,  Fevers, chills, fatigue, or  lassitude.  HEENT:   No headaches,  Difficulty swallowing,  Tooth/dental problems, or  Sore throat,                No sneezing, itching, ear ache, nasal congestion, post nasal drip,   CV:  No chest pain,  Orthopnea, PND, swelling in lower extremities, anasarca, dizziness, palpitations, syncope.   GI  No heartburn, indigestion, abdominal pain, nausea, vomiting, diarrhea, change in bowel habits, loss of appetite, bloody stools.   Resp: +shortness of breath with exertion less at rest.  No excess mucus, no productive cough,  No non-productive cough,  No coughing up of blood.  No change in color of mucus.  No wheezing.  No chest wall deformity  Skin: no rash or lesions.  GU: no dysuria, change in color of urine, no urgency or frequency.  No flank pain, no hematuria   MS:  No joint pain or swelling.  No decreased range of motion.  No back pain, knot to left hip from fall .  Psych:  No change in mood or affect. No depression or anxiety.  No memory loss.   Vital  Signs BP 140/64 (BP Location: Left Arm, Cuff Size: Normal)   Pulse 66   Ht 5' (1.524 m)   Wt 104 lb (47.2 kg)   SpO2 95%   BMI 20.31 kg/m    Physical Exam:  General- No distress,  A&Ox3, pleasant female wearing nasal oxygen. ENT: No sinus tenderness, TM clear, pale nasal mucosa, no oral exudate,no post nasal drip, no LAN Cardiac: S1, S2, regular rate and rhythm, no murmur Chest: No wheeze/ rales/ dullness; no accessory muscle use, no nasal flaring, no sternal retractions, prolonged expiration, diminished per bases Abd.: Soft Non-tender, non-distended, BS+ Ext: No clubbing cyanosis, edema Neuro:  Deconditioned at baseline, A&O x 3, MAE x 4, appropriate Skin: No rashes, warm and dry Psych: normal mood and behavior   Assessment/Plan  COPD GOLD II  Moderate COPD with emphysema Not using DuoNebs Not using Flovent ? Med compliance Plan: We will place an order for PFT's Please continue your Duonebs 3 mls through your nebulizer machine four times a day. This is your maintenance medication Use your albuterol nebs as needed for shortness of breath or wheezing. Continue Flovent 2 puffs twice daily Or use your albuterol inhaler as needed for shortness of breath or wheezing.  Consider pulmonary rehab Follow up with Dr. Chase Caller or Judson Roch NP in 3 weeks. Please contact office for sooner follow up if symptoms do not improve or worsen or seek emergency care    Pulmonary nodule, left  New spiculated 13 x 18 mm subpleural nodule left lower lobe per CT 08/20/2017. Pleural-based spiculated nodule of concern in the left lower lobe has a maximum SUV of 5.3, compatible with malignancy per PET 10/09/2017 Will need tissue typing via biopsy Concerned with significant pulmonary disease that options for biopsy and treatment will be limited. Plan: Referral to IR for biopsy of LLL new spiculated pulmonary nodule concerning for malignancy Pleural-based spiculated nodule of concern in the left lower  lobe has a maximum SUV of 5.3, compatible with malignancy. We will place an order for PFT's We will have Dr. Lamonte Sakai review your PET scan and decide the best options for  biopsy. IR referral for biopsy of LLL mass. Follow up after procedure with Dr. Chase Caller with CXR .  Chronic respiratory failure (HCC) Continue oxygen therapy at 2 L [place]. Saturation goals are 88-92%    Magdalen Spatz, NP 10/15/2017  4:36 PM

## 2017-10-16 ENCOUNTER — Telehealth: Payer: Self-pay | Admitting: Emergency Medicine

## 2017-10-16 ENCOUNTER — Emergency Department (HOSPITAL_COMMUNITY)
Admission: EM | Admit: 2017-10-16 | Discharge: 2017-10-16 | Disposition: A | Discharge status: Home / Self Care | Payer: Medicare Other | Attending: Emergency Medicine | Admitting: Emergency Medicine

## 2017-10-16 ENCOUNTER — Encounter (HOSPITAL_COMMUNITY): Payer: Self-pay | Admitting: *Deleted

## 2017-10-16 ENCOUNTER — Emergency Department (HOSPITAL_COMMUNITY): Payer: Medicare Other

## 2017-10-16 ENCOUNTER — Ambulatory Visit: Payer: Medicare Other

## 2017-10-16 ENCOUNTER — Other Ambulatory Visit: Payer: Self-pay

## 2017-10-16 DIAGNOSIS — W07XXXA Fall from chair, initial encounter: Secondary | ICD-10-CM | POA: Insufficient documentation

## 2017-10-16 DIAGNOSIS — N183 Chronic kidney disease, stage 3 (moderate): Secondary | ICD-10-CM | POA: Insufficient documentation

## 2017-10-16 DIAGNOSIS — S0101XA Laceration without foreign body of scalp, initial encounter: Secondary | ICD-10-CM | POA: Insufficient documentation

## 2017-10-16 DIAGNOSIS — J449 Chronic obstructive pulmonary disease, unspecified: Secondary | ICD-10-CM | POA: Diagnosis not present

## 2017-10-16 DIAGNOSIS — Z7982 Long term (current) use of aspirin: Secondary | ICD-10-CM | POA: Insufficient documentation

## 2017-10-16 DIAGNOSIS — J45909 Unspecified asthma, uncomplicated: Secondary | ICD-10-CM | POA: Insufficient documentation

## 2017-10-16 DIAGNOSIS — Z87891 Personal history of nicotine dependence: Secondary | ICD-10-CM | POA: Diagnosis not present

## 2017-10-16 DIAGNOSIS — Y92511 Restaurant or cafe as the place of occurrence of the external cause: Secondary | ICD-10-CM | POA: Insufficient documentation

## 2017-10-16 DIAGNOSIS — E1122 Type 2 diabetes mellitus with diabetic chronic kidney disease: Secondary | ICD-10-CM | POA: Diagnosis not present

## 2017-10-16 DIAGNOSIS — R918 Other nonspecific abnormal finding of lung field: Secondary | ICD-10-CM

## 2017-10-16 DIAGNOSIS — Z79899 Other long term (current) drug therapy: Secondary | ICD-10-CM | POA: Insufficient documentation

## 2017-10-16 DIAGNOSIS — M6281 Muscle weakness (generalized): Secondary | ICD-10-CM

## 2017-10-16 DIAGNOSIS — R2681 Unsteadiness on feet: Secondary | ICD-10-CM

## 2017-10-16 DIAGNOSIS — R2689 Other abnormalities of gait and mobility: Secondary | ICD-10-CM

## 2017-10-16 DIAGNOSIS — Y998 Other external cause status: Secondary | ICD-10-CM | POA: Insufficient documentation

## 2017-10-16 DIAGNOSIS — I129 Hypertensive chronic kidney disease with stage 1 through stage 4 chronic kidney disease, or unspecified chronic kidney disease: Secondary | ICD-10-CM | POA: Insufficient documentation

## 2017-10-16 DIAGNOSIS — Z7984 Long term (current) use of oral hypoglycemic drugs: Secondary | ICD-10-CM | POA: Diagnosis not present

## 2017-10-16 DIAGNOSIS — Y9389 Activity, other specified: Secondary | ICD-10-CM | POA: Insufficient documentation

## 2017-10-16 MED ORDER — BACITRACIN ZINC 500 UNIT/GM EX OINT
TOPICAL_OINTMENT | Freq: Two times a day (BID) | CUTANEOUS | Status: DC
  Administered 2017-10-16: 1 via TOPICAL

## 2017-10-16 MED ORDER — LIDOCAINE-EPINEPHRINE (PF) 2 %-1:200000 IJ SOLN
10.0000 mL | Freq: Once | INTRAMUSCULAR | Status: AC
  Administered 2017-10-16: 10 mL via INTRADERMAL
  Filled 2017-10-16: qty 20

## 2017-10-16 MED ORDER — BACITRACIN ZINC 500 UNIT/GM EX OINT: 1.0000 "application " | TOPICAL_OINTMENT | Freq: Two times a day (BID) | CUTANEOUS | 0 refills | Status: DC

## 2017-10-16 MED ORDER — CEPHALEXIN 500 MG PO CAPS: 500.0000 mg | ORAL_CAPSULE | Freq: Two times a day (BID) | ORAL | 0 refills | Status: AC

## 2017-10-16 NOTE — Patient Instructions (Signed)
ANKLE: Eversion, Bilateral    Sit at edge of surface, feet on floor. Raise toes of both feet up and move them away from body. Do not move hips or knees. __10_ reps per set, _3__ sets per day, _3__ days per week  Copyright  [employer]. All rights reserved.

## 2017-10-16 NOTE — ED Triage Notes (Signed)
Pt fell backwards in chair and hit the back of her head.  Pt denies LOC, reports headache, no neck pain. Pt denies taking any blood thinner.  Pt bleeding seems to have stopped.

## 2017-10-16 NOTE — Therapy (Signed)
[facility] [address] [place], [place], [address] Phone: [phone]   Fax:  [phone]  Physical Therapy Treatment  Patient Details  Name: [name] MRN: [id number] Date of Birth: 1932-01-29 Referring Provider: Dr. Dema Severin   Encounter Date: 10/16/2017  PT End of Session - 10/16/17 0934    Visit Number  8    Number of Visits  17    Date for PT Re-Evaluation  11/03/17    Authorization Type  [employer] Medicare: G-CODE AND PN EVERY 10TH VISIT.     PT Start Time  [phone]    PT Stop Time  0929    PT Time Calculation (min)  40 min    Equipment Utilized During Treatment  Gait belt    Activity Tolerance  Patient tolerated treatment well    Behavior During Therapy  WFL for tasks assessed/performed       Past Medical History:  Diagnosis Date  . Anxiety   . Aortic stenosis   . Arthritis   . Asthma    uses inhaler as needed  . Depression   . Diabetes mellitus   . Emphysema of lung ([place])   . GERD (gastroesophageal reflux disease)   . Glaucoma   . Hyperlipidemia   . Hypertension   . [name]   . Murmur   . Nephrolithiasis     Past Surgical History:  Procedure Laterality Date  . ABDOMINAL HYSTERECTOMY    . ANKLE SURGERY  1998   d/t fx.  Left ankle  . ANTERIOR AND POSTERIOR REPAIR  06/21/2011   Procedure: ANTERIOR (CYSTOCELE) AND POSTERIOR REPAIR (RECTOCELE);  Surgeon: Maisie Fus;  Location: [facility];  Service: Gynecology;  Laterality: N/A;  . AORTIC VALVE REPLACEMENT N/A 07/17/2013   Procedure: AORTIC VALVE REPLACEMENT (AVR);  Surgeon: Ivin Poot, MD;  Location: [facility];  Service: Open Heart Surgery;  Laterality: N/A;  . CARDIAC CATHETERIZATION    . CARDIAC VALVE REPLACEMENT    . CORONARY ANGIOPLASTY    . CORONARY ARTERY BYPASS GRAFT N/A 07/17/2013   Procedure: CORONARY ARTERY BYPASS GRAFT, TIMES ONE, ON PUMP, USING RIGHT INTERNAL MAMMARY ARTERY.;  Surgeon: Ivin Poot, MD;  Location:  [facility];  Service: Open Heart Surgery;  Laterality: N/A;  . FACIAL COSMETIC SURGERY     face lift  . INTRAOPERATIVE TRANSESOPHAGEAL ECHOCARDIOGRAM N/A 07/17/2013   Procedure: INTRAOPERATIVE TRANSESOPHAGEAL ECHOCARDIOGRAM;  Surgeon: Ivin Poot, MD;  Location: [facility];  Service: Open Heart Surgery;  Laterality: N/A;  . LAPAROSCOPIC ASSISTED VAGINAL HYSTERECTOMY  06/21/2011   Procedure: LAPAROSCOPIC ASSISTED VAGINAL HYSTERECTOMY;  Surgeon: Maisie Fus;  Location: [facility];  Service: Gynecology;  Laterality: N/A;  . LEFT AND RIGHT HEART CATHETERIZATION WITH CORONARY ANGIOGRAM N/A 06/27/2013   Procedure: LEFT AND RIGHT HEART CATHETERIZATION WITH CORONARY ANGIOGRAM;  Surgeon: Ramond Dial, MD;  Location: [facility];  Service: Cardiovascular;  Laterality: N/A;  . NOSE SURGERY    . SALPINGOOPHORECTOMY  06/21/2011   Procedure: SALPINGO OOPHERECTOMY;  Surgeon: Maisie Fus;  Location: [facility];  Service: Gynecology;  Laterality: Bilateral;  . VAGINAL PROLAPSE REPAIR  06/21/2011   Procedure: SACROPEXY;  Surgeon: Maisie Fus;  Location: [facility];  Service: Gynecology;  Laterality: N/A;  sacrospinous ligament suspension    There were no vitals filed for this visit.  Subjective Assessment - 10/16/17 0851    Subjective  Pt reported MD told pt she might have lung CA, they'd like  to do a biopsy to determine etiology of lung nodule. Pt reported she had one fall inside a store, she tripped on a rug when she was not using rollator. Pt denied injury.     Pertinent History  2L of SpO2, osteoporosis, COPD, aortic valve replacement, HTN, HLD, DM, depression, diverticulosis, incontinence, glaucoma, 02/2011: 40-50% carotid stenosis, emphysema, CKD stage 3, CAD, hiatal hernia    Patient Stated Goals  She'd love to stop using SpO2 as it is heavy, she'd like to walk without an AD.    Currently in Pain?  No/denies                Therex: Performed seated with cues and demo for technique. Please see ankle  eversion pt instructions for HEP details. No pain noted. No charge for NuStep at end of session (until 0935)      [facility] Adult PT Treatment/Exercise - 10/16/17 0855      High Level Balance   High Level Balance Activities  Side stepping;Braiding;Backward walking;Head turns;Marching forwards forward amb. over mats    High Level Balance Comments  Performed min guard to min A for safety: cues for technique. Performed 4x20'/activity. Head turns performed over compliant and non-compliant surfaces.              PT Education - 10/16/17 0933    Education provided  Yes    Education Details  PT provided pt with ankle eversion HEP to decr. ankle inversion during SLS on compliant surfaces.     Person(s) Educated  Patient    Methods  Explanation;Demonstration;Verbal cues;Handout    Comprehension  Returned demonstration;Verbalized understanding       PT Short Term Goals - 10/09/17 1154      PT SHORT TERM GOAL #1   Title  Pt will be IND in HEP to improve balance, endurance, strength, and flexibility. TARGET DATE FOR ALL STGS: 10/02/17    Status  Achieved      PT SHORT TERM GOAL #2   Title  Pt will improve DGI score to >/=16/24 to decr. falls risk.     Status  Achieved      PT SHORT TERM GOAL #3   Title  Pt will improve gait speed with LRAD, not holding SpO2 machine to >/=2.62 ft/sec. to safely amb. in the community.     Status  Not Met      PT SHORT TERM GOAL #4   Title  Pt will report zero falls over the last 2 weeks to improve safety.     Status  Not Met      PT SHORT TERM GOAL #5   Title  Perform 6MWT and write goals as indicated.     Status  Achieved      PT SHORT TERM GOAL #6   Title  Pt will amb. 500' with LRAD, not carrying SpO2 machine, at MOD I level to improve safety during functional mobility.     Status  Achieved        PT Long Term Goals - 09/12/17 1154      PT LONG TERM GOAL #1   Title  Pt will verbalize understanding fall prevention strategies to reduce falls  risk. TARGET DATE FOR ALL LTGS: 10/30/17    Status  New      PT LONG TERM GOAL #2   Title  Pt will improve DGI score to >/=20/24 to decr. falls risk.     Status  New      PT  LONG TERM GOAL #3   Title  Pt will amb. 600' over even/uneven terrain with LRAD, at MOD I level, including traversing curbs to improve safety during functional mobility.     Status  New      PT LONG TERM GOAL #4   Title  Pt will amb. 100', at MOD I level (slower speed), over even terrain to amb. safely at home.     Status  New      PT LONG TERM GOAL #5   Title  Pt will improve walking distance during 6MWT to 694' to improve endurance.     Status  New            Plan - 10/16/17 0934    Clinical Impression Statement  Pt demonstrated progress as she was able to tolerate all activity with SaO2 on 2L of O2 >/=96%. Pt did require min guard to min A during compliant surface balance activities 2/2 LOB. Pt would continue to benefit from skilled PT to improve safety during functional mobilty. PT tech assisted pt off Nustep after session ended, as pt performed for 3 minutes to improve endurance with all 4 extremities.     Rehab Potential  Good    Clinical Impairments Affecting Rehab Potential  see above    PT Frequency  2x / week    PT Duration  8 weeks    PT Treatment/Interventions  ADLs/Self Care Home Management;Biofeedback;Therapeutic exercise;Manual techniques;Therapeutic activities;Functional mobility training;Stair training;Gait training;Patient/family education;Orthotic Fit/Training;DME Instruction;Neuromuscular re-education;Balance training;Vestibular    PT Next Visit Plan  High level balance and gait     PT Home Exercise Plan  balance HEP given on 09-25-17    Consulted and Agree with Plan of Care  Patient       Patient will benefit from skilled therapeutic intervention in order to improve the following deficits and impairments:  Abnormal gait, Decreased endurance, Decreased knowledge of use of DME, Decreased  balance, Decreased mobility, Impaired flexibility, Postural dysfunction, Decreased strength  Visit Diagnosis: Other abnormalities of gait and mobility  Muscle weakness (generalized)  Unsteadiness on feet     Problem List Patient Active Problem List   Diagnosis Date Noted  . Pulmonary nodule, left 10/15/2017  . Anemia, chronic disease 12/07/2016  . Chronic obstructive pulmonary disease ([place])   . Chronic respiratory failure ([place]) 11/19/2016  . CKD (chronic kidney disease), stage III ([place]) 11/19/2016  . Diabetes mellitus type 2, uncontrolled ([place]) 11/19/2016  . Recurrent pneumonia 11/19/2016  . Acute on chronic diastolic heart failure ([place]) 11/19/2016  . Pulmonary HTN ([place]) 11/19/2016  . HLD (hyperlipidemia) 11/19/2016  . Hiatal hernia 05/01/2016  . History of bacterial pneumonia 05/01/2016  . COPD exacerbation ([place]) 11/18/2015  . Physical deconditioning 07/24/2013  . S/P AVR (aortic valve replacement) 07/23/2013  . S/P CABG x 1 07/23/2013  . Aortic stenosis 06/10/2013  . Essential hypertension 06/10/2013  . COPD GOLD II  10/26/2011    Darreld Hoffer L 10/16/2017, 9:37 AM  [facility] [address] [place], [place], [address] Phone: [phone]   Fax:  [phone]  Name: [name] MRN: [id number] Date of Birth: August 23, 1932  Geoffry Paradise, PT,DPT 10/16/17 9:38 AM Phone: [phone] Fax: [phone]

## 2017-10-16 NOTE — ED Notes (Signed)
Patient given discharge instructions and verbalized understanding.  Patient stable to discharge at this time.  Patient is alert and oriented to baseline.  No distressed noted at this time.  All belongings taken with the patient at discharge.   

## 2017-10-16 NOTE — Discharge Instructions (Signed)
It is OK to go to Physical Therapy this week. If you start to develop headache, nausea, vomiting, dizziness while doing therapy, stop and wait until next week.

## 2017-10-16 NOTE — ED Provider Notes (Addendum)
[facility]PART[facility] Provider Note   CSN: [id number] Arrival date & time: 10/16/17  1333     History   Chief Complaint No chief complaint on file.   HPI [name] is a 84 y.o. female.  HPI   84 year old female with extensive past medical history as below here with fall.  The patient was at a restaurant today sitting on a stool.  The stool reportedly did not have a back and she tried to scoot backwards from a drink that was spilled, causing her to fall backwards.  She fell backwards and struck the back of her head on the ground.  She then had a small amount of bleeding.  There is no loss of consciousness.  She reports on immediate onset of moderate, aching, throbbing, posterior head pain that has since improved.  She said no nausea or vomiting.  She is been at her baseline state of health according to the husband.  She is not on blood thinners.  Her tetanus is up-to-date.  Bleeding is controlled with pressure.  Past Medical History:  Diagnosis Date  . Anxiety   . Aortic stenosis   . Arthritis   . Asthma    uses inhaler as needed  . Depression   . Diabetes mellitus   . Emphysema of lung ([place])   . GERD (gastroesophageal reflux disease)   . Glaucoma   . Hyperlipidemia   . Hypertension   . [name]   . Murmur   . Nephrolithiasis     Patient Active Problem List   Diagnosis Date Noted  . Pulmonary nodule, left 10/15/2017  . Anemia, chronic disease 12/07/2016  . Chronic obstructive pulmonary disease ([place])   . Chronic respiratory failure ([place]) 11/19/2016  . CKD (chronic kidney disease), stage III ([place]) 11/19/2016  . Diabetes mellitus type 2, uncontrolled ([place]) 11/19/2016  . Recurrent pneumonia 11/19/2016  . Acute on chronic diastolic heart failure ([place]) 11/19/2016  . Pulmonary HTN ([place]) 11/19/2016  . HLD (hyperlipidemia) 11/19/2016  . Hiatal hernia 05/01/2016  . History of bacterial pneumonia 05/01/2016  . COPD  exacerbation ([place]) 11/18/2015  . Physical deconditioning 07/24/2013  . S/P AVR (aortic valve replacement) 07/23/2013  . S/P CABG x 1 07/23/2013  . Aortic stenosis 06/10/2013  . Essential hypertension 06/10/2013  . COPD GOLD II  10/26/2011    Past Surgical History:  Procedure Laterality Date  . ABDOMINAL HYSTERECTOMY    . ANKLE SURGERY  1998   d/t fx.  Left ankle  . ANTERIOR AND POSTERIOR REPAIR  06/21/2011   Procedure: ANTERIOR (CYSTOCELE) AND POSTERIOR REPAIR (RECTOCELE);  Surgeon: Maisie Fus;  Location: [facility];  Service: Gynecology;  Laterality: N/A;  . AORTIC VALVE REPLACEMENT N/A 07/17/2013   Procedure: AORTIC VALVE REPLACEMENT (AVR);  Surgeon: Ivin Poot, MD;  Location: [facility];  Service: Open Heart Surgery;  Laterality: N/A;  . CARDIAC CATHETERIZATION    . CARDIAC VALVE REPLACEMENT    . CORONARY ANGIOPLASTY    . CORONARY ARTERY BYPASS GRAFT N/A 07/17/2013   Procedure: CORONARY ARTERY BYPASS GRAFT, TIMES ONE, ON PUMP, USING RIGHT INTERNAL MAMMARY ARTERY.;  Surgeon: Ivin Poot, MD;  Location: [facility];  Service: Open Heart Surgery;  Laterality: N/A;  . FACIAL COSMETIC SURGERY     face lift  . INTRAOPERATIVE TRANSESOPHAGEAL ECHOCARDIOGRAM N/A 07/17/2013   Procedure: INTRAOPERATIVE TRANSESOPHAGEAL ECHOCARDIOGRAM;  Surgeon: Ivin Poot, MD;  Location: [facility];  Service: Open Heart Surgery;  Laterality: N/A;  .  LAPAROSCOPIC ASSISTED VAGINAL HYSTERECTOMY  06/21/2011   Procedure: LAPAROSCOPIC ASSISTED VAGINAL HYSTERECTOMY;  Surgeon: Maisie Fus;  Location: [facility];  Service: Gynecology;  Laterality: N/A;  . LEFT AND RIGHT HEART CATHETERIZATION WITH CORONARY ANGIOGRAM N/A 06/27/2013   Procedure: LEFT AND RIGHT HEART CATHETERIZATION WITH CORONARY ANGIOGRAM;  Surgeon: Ramond Dial, MD;  Location: [facility];  Service: Cardiovascular;  Laterality: N/A;  . NOSE SURGERY    . SALPINGOOPHORECTOMY  06/21/2011   Procedure: SALPINGO OOPHERECTOMY;  Surgeon: Maisie Fus;  Location:  [facility];  Service: Gynecology;  Laterality: Bilateral;  . VAGINAL PROLAPSE REPAIR  06/21/2011   Procedure: SACROPEXY;  Surgeon: Maisie Fus;  Location: [facility];  Service: Gynecology;  Laterality: N/A;  sacrospinous ligament suspension    OB History    No data available       Home Medications    Prior to Admission medications   Medication Sig Start Date End Date Taking? Authorizing Provider  acetaminophen (TYLENOL) 500 MG tablet Take 500 mg by mouth every 6 (six) hours as needed for headache.   Yes [provider]  albuterol (PROVENTIL) (2.5 MG/3ML) 0.083% nebulizer solution Take 3 mLs (2.5 mg total) by nebulization every 6 (six) hours as needed for wheezing. 11/23/16  Yes Eugenie Filler, MD  amLODipine (NORVASC) 10 MG tablet Take 1 tablet (10 mg total) by mouth daily. 10/19/15  Yes Lelon Perla, MD  aspirin EC 81 MG tablet Take 81 mg by mouth at bedtime.   Yes [provider]  buPROPion (WELLBUTRIN XL) 150 MG 24 hr tablet Take 150 mg by mouth daily. 06/04/17  Yes [provider]  cholecalciferol (VITAMIN D) 1000 units tablet Take 1,000 Units by mouth at bedtime.    Yes [provider]  fluticasone (FLOVENT HFA) 220 MCG/ACT inhaler Inhale 2 puffs into the lungs 2 (two) times daily.   Yes [provider]  furosemide (LASIX) 40 MG tablet Take 1 tablet (40 mg total) by mouth every other day. Patient taking differently: Take 20 mg by mouth every other day.  11/23/16  Yes Eugenie Filler, MD  glimepiride (AMARYL) 1 MG tablet Take 1 mg by mouth daily with breakfast.    Yes [provider]  guaifenesin (ROBITUSSIN) 100 MG/5ML syrup Take 50 mg by mouth 3 (three) times daily as needed for cough. Liquid Mucinex   Yes [provider]  ipratropium-albuterol (DUONEB) 0.5-2.5 (3) MG/3ML SOLN Take 3 mLs by nebulization 4 (four) times daily. Dx: J44.9 01/01/17  Yes Parrett, Tammy S, NP  irbesartan (AVAPRO) 150 MG tablet Take 1 tablet (150  mg total) by mouth daily. 10/12/14  Yes Lelon Perla, MD  Magnesium 250 MG TABS Take 250 mg by mouth at bedtime.    Yes [provider]  Multiple Vitamins-Minerals (OCUVITE-LUTEIN PO) Take 1 tablet by mouth every morning.    Yes [provider]  pravastatin (PRAVACHOL) 40 MG tablet Take 40 mg by mouth every evening.    Yes [provider]  PROAIR HFA 108 (90 BASE) MCG/ACT inhaler Inhale 2 puffs into the lungs 4 (four) times daily as needed for wheezing or shortness of breath.  02/24/14  Yes [provider]  sertraline (ZOLOFT) 100 MG tablet Take 1 tablet (100 mg total) by mouth every morning. 11/24/16  Yes Eugenie Filler, MD  bacitracin ointment Apply 1 application topically 2 (two) times daily. For 10 days 10/16/17   Duffy Bruce, MD  cephALEXin [facility]) 500  MG capsule Take 1 capsule (500 mg total) by mouth 2 (two) times daily for 5 days. 10/16/17 10/21/17  Duffy Bruce, MD    Family History Family History  Problem Relation Age of Onset  . Emphysema Father   . Colon cancer Neg Hx   . Esophageal cancer Neg Hx   . Rectal cancer Neg Hx   . Stomach cancer Neg Hx     Social History Social History   Tobacco Use  . Smoking status: Former Smoker    Packs/day: 2.00    Years: 15.00    Pack years: 30.00    Types: Cigarettes    Last attempt to quit: 09/18/1972    Years since quitting: 45.1  . Smokeless tobacco: Never Used  Substance Use Topics  . Alcohol use: No    Alcohol/week: 0.0 oz  . Drug use: No     Allergies   Amaryl [glimepiride]; Avelox [moxifloxacin hcl in nacl]; Ciprofloxacin; Lipitor [atorvastatin]; Lisinopril; Other; Prevacid [lansoprazole]; Spiriva handihaler [tiotropium bromide monohydrate]; and Symbicort [budesonide-formoterol fumarate]   Review of Systems Review of Systems  Constitutional: Negative for chills, fatigue and fever.  HENT: Negative for congestion and rhinorrhea.   Eyes: Negative for visual disturbance.    Respiratory: Negative for cough, shortness of breath and wheezing.   Cardiovascular: Negative for chest pain and leg swelling.  Gastrointestinal: Negative for abdominal pain, diarrhea, nausea and vomiting.  Genitourinary: Negative for dysuria and flank pain.  Musculoskeletal: Negative for neck pain and neck stiffness.  Skin: Positive for wound. Negative for rash.  Allergic/Immunologic: Negative for immunocompromised state.  Neurological: Positive for headaches. Negative for syncope and weakness.  All other systems reviewed and are negative.    Physical Exam Updated Vital Signs BP (!) 173/47   Pulse 67   Temp 98 F (36.7 C) (Oral)   Resp 18   Ht 5\' 8"  (1.727 m)   Wt 46.3 kg (102 lb)   SpO2 100%   BMI 15.51 kg/m   Physical Exam  Constitutional: She is oriented to person, place, and time. She appears well-developed and well-nourished. No distress.  HENT:  Head: Normocephalic and atraumatic.  Mouth/Throat: Oropharynx is clear and moist.  Approx 2.5 cm stellate laceration to right parieto-occipital scalp, with surrounding ecchymoses and swelling. No exposed calvarium.  No postauricular ecchymosis.  No periorbital ecchymosis.  Eyes: Conjunctivae are normal.  Neck: Neck supple.  Cardiovascular: Normal rate, regular rhythm and normal heart sounds. Exam reveals no friction rub.  No murmur heard. Pulmonary/Chest: Effort normal and breath sounds normal. No respiratory distress. She has no wheezes. She has no rales.  Abdominal: She exhibits no distension.  Musculoskeletal: She exhibits no edema.  Neurological: She is alert and oriented to person, place, and time. She exhibits normal muscle tone.  Skin: Skin is warm. Capillary refill takes less than 2 seconds.  Psychiatric: She has a normal mood and affect.  Nursing note and vitals reviewed.     ED Treatments / Results  Labs (all labs ordered are listed, but only abnormal results are displayed) Labs Reviewed - No data to  display  EKG  EKG Interpretation None       Radiology Ct Head Wo Contrast  Result Date: 10/16/2017 CLINICAL DATA:  Ct head/cspine wo, Pt fell backwards in chair and hit the back of her head. Pt denies LOC, reports headache, no neck pain EXAM: CT HEAD WITHOUT CONTRAST CT CERVICAL SPINE WITHOUT CONTRAST TECHNIQUE: Multidetector CT imaging of the head and cervical spine was performed  following the standard protocol without intravenous contrast. Multiplanar CT image reconstructions of the cervical spine were also generated. COMPARISON:  None. FINDINGS: CT HEAD FINDINGS Brain: No intracranial hemorrhage. No parenchymal contusion. No midline shift or mass effect. Basilar cisterns are patent. No skull base fracture. No fluid in the paranasal sinuses or mastoid air cells. Orbits are normal. There are periventricular and subcortical white matter hypodensities. Generalized cortical atrophy. Vascular: No hyperdense vessel or unexpected calcification. Skull: Normal. Negative for fracture or focal lesion. Sinuses/Orbits: Paranasal sinuses and mastoid air cells are clear. Orbits are clear. Other: None. CT CERVICAL SPINE FINDINGS Alignment: Normal alignment of the cervical vertebral bodies. Skull base and vertebrae: Normal craniocervical junction. No loss of vertebral body height or disc height. Normal facet articulation. No evidence of fracture. Soft tissues and spinal canal: No prevertebral soft tissue swelling. No perispinal or epidural hematoma. Disc levels:  Unremarkable Upper chest: Clear Other: None IMPRESSION: 1. No intracranial trauma. 2. Mild atrophy white matter microvascular disease. 3. No cervical spine fracture. Electronically Signed   By: Suzy Bouchard M.D.   On: 10/16/2017 15:33   Ct Cervical Spine Wo Contrast  Result Date: 10/16/2017 CLINICAL DATA:  Ct head/cspine wo, Pt fell backwards in chair and hit the back of her head. Pt denies LOC, reports headache, no neck pain EXAM: CT HEAD WITHOUT  CONTRAST CT CERVICAL SPINE WITHOUT CONTRAST TECHNIQUE: Multidetector CT imaging of the head and cervical spine was performed following the standard protocol without intravenous contrast. Multiplanar CT image reconstructions of the cervical spine were also generated. COMPARISON:  None. FINDINGS: CT HEAD FINDINGS Brain: No intracranial hemorrhage. No parenchymal contusion. No midline shift or mass effect. Basilar cisterns are patent. No skull base fracture. No fluid in the paranasal sinuses or mastoid air cells. Orbits are normal. There are periventricular and subcortical white matter hypodensities. Generalized cortical atrophy. Vascular: No hyperdense vessel or unexpected calcification. Skull: Normal. Negative for fracture or focal lesion. Sinuses/Orbits: Paranasal sinuses and mastoid air cells are clear. Orbits are clear. Other: None. CT CERVICAL SPINE FINDINGS Alignment: Normal alignment of the cervical vertebral bodies. Skull base and vertebrae: Normal craniocervical junction. No loss of vertebral body height or disc height. Normal facet articulation. No evidence of fracture. Soft tissues and spinal canal: No prevertebral soft tissue swelling. No perispinal or epidural hematoma. Disc levels:  Unremarkable Upper chest: Clear Other: None IMPRESSION: 1. No intracranial trauma. 2. Mild atrophy white matter microvascular disease. 3. No cervical spine fracture. Electronically Signed   By: Suzy Bouchard M.D.   On: 10/16/2017 15:33    Procedures .Marland KitchenLaceration Repair Date/Time: 10/16/2017 10:51 PM Performed by: Duffy Bruce, MD Authorized by: Duffy Bruce, MD   Consent:    Consent obtained:  Verbal   Consent given by:  Patient   Risks discussed:  Infection, need for additional repair, pain, tendon damage, retained foreign body, vascular damage, poor cosmetic result, poor wound healing and nerve damage   Alternatives discussed:  Referral and delayed treatment Anesthesia (see MAR for exact dosages):     Anesthesia method:  Local infiltration   Local anesthetic:  Lidocaine 1% WITH epi Laceration details:    Location:  Scalp   Scalp location:  R parietal   Length (cm):  2.5 Repair type:    Repair type:  Simple Pre-procedure details:    Preparation:  Patient was prepped and draped in usual sterile fashion and imaging obtained to evaluate for foreign bodies Exploration:    Hemostasis achieved with:  Direct pressure  Wound exploration: wound explored through full range of motion and entire depth of wound probed and visualized     Wound extent: no foreign bodies/material noted, no muscle damage noted, no tendon damage noted, no underlying fracture noted and no vascular damage noted     Contaminated: no   Treatment:    Area cleansed with:  Betadine   Amount of cleaning:  Extensive   Irrigation solution:  Sterile water   Irrigation volume:  500   Irrigation method:  Pressure wash Skin repair:    Repair method:  Staples   Number of staples:  4 Approximation:    Approximation:  Close   Vermilion border: well-aligned   Post-procedure details:    Dressing:  Antibiotic ointment and adhesive bandage   Patient tolerance of procedure:  Tolerated well, no immediate complications   (including critical care time)  Medications Ordered in ED Medications  bacitracin ointment (1 application Topical Given 10/16/17 2118)  lidocaine-EPINEPHrine (XYLOCAINE W/EPI) 2 %-1:200000 (PF) injection 10 mL (10 mLs Intradermal Given 10/16/17 1916)     Initial Impression / Assessment and Plan / ED Course  I have reviewed the triage vital signs and the nursing notes.  Pertinent labs & imaging results that were available during my care of the patient were reviewed by me and considered in my medical decision making (see chart for details).     84 year old female here with stellate laceration to right parietal scalp after falling backwards.  No LOC.  Patient not on blood thinners.  CT head and cervical spine are  negative and she has no upper extremity weakness, numbness, tingling, or signs of central cord or occult spinal injury.  She is at her neurological baseline.  Laceration was repaired as above.  Will place her on prophylactic antibiotics given wound was from floor, and discharge home.  Patient tolerated procedure well.   Final Clinical Impressions(s) / ED Diagnoses   Final diagnoses:  Laceration of scalp without foreign body, initial encounter    ED Discharge Orders        Ordered    bacitracin ointment  2 times daily     10/16/17 2059    cephALEXin (KEFLEX) 500 MG capsule  2 times daily     10/16/17 2059       Duffy Bruce, MD 10/16/17 2252    Duffy Bruce, MD 10/16/17 2253

## 2017-10-16 NOTE — Telephone Encounter (Signed)
The questions are on the order, RB do you want any labs? See pended order

## 2017-10-16 NOTE — Telephone Encounter (Signed)
No, I think the radiologist will order what they need. Thanks though.

## 2017-10-16 NOTE — Telephone Encounter (Signed)
Only cytology needs to be ordered

## 2017-10-16 NOTE — ED Provider Notes (Signed)
Patient placed in Quick Look pathway, seen and evaluated   Chief Complaint: fall  HPI:  84 year old female presents status post fall.  She reports she was sitting in a stool when she fell back striking the posterior aspect of her head.  She did not minor bleeding to the head, generalized headache, no significant neck pain no neurological deficits.  No other complaints   ROS: Headache (one)  Physical Exam:   Vitals:   10/16/17 1356  BP: (!) 132/113  Pulse: (!) 135  Resp: 18  Temp: 98 F (36.7 C)  SpO2: 100%     Gen: No distress  Neuro: Awake and Alert  Skin: Warm    Focused Exam: Dried blood on the posterior aspect of the skull with surrounding hematoma no C-spine tenderness palpation   Initiation of care has begun. The patient has been counseled on the process, plan, and necessity for staying for the completion/evaluation, and the remainder of the medical screening examination    Okey Regal, Hershal Coria 10/16/17 [place], Wenda Overland, MD 10/19/17 1600

## 2017-10-16 NOTE — Telephone Encounter (Signed)
I reviewed the case with Dr. Pascal Lux in radiology.  Based on the location and the difficulty in reaching this lesion he has recommended a repeat CT scan 3 months following the most recent imaging to look for interval change.  If the lesion remains than we will refer her back for possible needle biopsy.

## 2017-10-16 NOTE — ED Notes (Signed)
Chart reviewed.

## 2017-10-16 NOTE — Telephone Encounter (Signed)
Thanks so much. I will let the patient know. I have called the patient. There was no answer. I have left her a message and requested she call the office. I have left the contact number for her to call.Jonelle Sidle, please place order for 3 month follow up CT scan from date of last CT chest without contrast. Please schedule an appointment with either me or Dr. Chase Caller within a few days of the scan being completed.  Thanks so much

## 2017-10-16 NOTE — Telephone Encounter (Signed)
Ok order placed.  

## 2017-10-17 NOTE — Telephone Encounter (Signed)
Patient is returning phone call from  [place].

## 2017-10-17 NOTE — Telephone Encounter (Signed)
CT ordered. 

## 2017-10-17 NOTE — Telephone Encounter (Signed)
What dod you want me to do?

## 2017-10-18 ENCOUNTER — Ambulatory Visit: Payer: Medicare Other

## 2017-10-18 ENCOUNTER — Telehealth: Payer: Self-pay | Admitting: Acute Care

## 2017-10-18 DIAGNOSIS — R2689 Other abnormalities of gait and mobility: Secondary | ICD-10-CM

## 2017-10-18 DIAGNOSIS — M6281 Muscle weakness (generalized): Secondary | ICD-10-CM

## 2017-10-18 DIAGNOSIS — R2681 Unsteadiness on feet: Secondary | ICD-10-CM

## 2017-10-18 NOTE — Therapy (Signed)
[facility] [address] [place], [place], [address] Phone: [phone]   Fax:  [phone]  Physical Therapy Treatment  Patient Details  Name: [name] MRN: [id number] Date of Birth: 1932-03-03 Referring Provider: Dr. Dema Severin   Encounter Date: 10/18/2017  PT End of Session - 10/18/17 1152    Visit Number  9    Number of Visits  17    Date for PT Re-Evaluation  11/03/17    Authorization Type  [employer] Medicare: G-CODE AND PN EVERY 10TH VISIT.     PT Start Time  1058    PT Stop Time  1141    PT Time Calculation (min)  43 min    Equipment Utilized During Treatment  Gait belt    Activity Tolerance  Patient tolerated treatment well    Behavior During Therapy  WFL for tasks assessed/performed       Past Medical History:  Diagnosis Date  . Anxiety   . Aortic stenosis   . Arthritis   . Asthma    uses inhaler as needed  . Depression   . Diabetes mellitus   . Emphysema of lung ([place])   . GERD (gastroesophageal reflux disease)   . Glaucoma   . Hyperlipidemia   . Hypertension   . [name]   . Murmur   . Nephrolithiasis     Past Surgical History:  Procedure Laterality Date  . ABDOMINAL HYSTERECTOMY    . ANKLE SURGERY  1998   d/t fx.  Left ankle  . ANTERIOR AND POSTERIOR REPAIR  06/21/2011   Procedure: ANTERIOR (CYSTOCELE) AND POSTERIOR REPAIR (RECTOCELE);  Surgeon: Maisie Fus;  Location: [facility];  Service: Gynecology;  Laterality: N/A;  . AORTIC VALVE REPLACEMENT N/A 07/17/2013   Procedure: AORTIC VALVE REPLACEMENT (AVR);  Surgeon: Ivin Poot, MD;  Location: [facility];  Service: Open Heart Surgery;  Laterality: N/A;  . CARDIAC CATHETERIZATION    . CARDIAC VALVE REPLACEMENT    . CORONARY ANGIOPLASTY    . CORONARY ARTERY BYPASS GRAFT N/A 07/17/2013   Procedure: CORONARY ARTERY BYPASS GRAFT, TIMES ONE, ON PUMP, USING RIGHT INTERNAL MAMMARY ARTERY.;  Surgeon: Ivin Poot, MD;  Location:  [facility];  Service: Open Heart Surgery;  Laterality: N/A;  . FACIAL COSMETIC SURGERY     face lift  . INTRAOPERATIVE TRANSESOPHAGEAL ECHOCARDIOGRAM N/A 07/17/2013   Procedure: INTRAOPERATIVE TRANSESOPHAGEAL ECHOCARDIOGRAM;  Surgeon: Ivin Poot, MD;  Location: [facility];  Service: Open Heart Surgery;  Laterality: N/A;  . LAPAROSCOPIC ASSISTED VAGINAL HYSTERECTOMY  06/21/2011   Procedure: LAPAROSCOPIC ASSISTED VAGINAL HYSTERECTOMY;  Surgeon: Maisie Fus;  Location: [facility];  Service: Gynecology;  Laterality: N/A;  . LEFT AND RIGHT HEART CATHETERIZATION WITH CORONARY ANGIOGRAM N/A 06/27/2013   Procedure: LEFT AND RIGHT HEART CATHETERIZATION WITH CORONARY ANGIOGRAM;  Surgeon: Ramond Dial, MD;  Location: [facility] C[facility];  Service: Cardiovascular;  Laterality: N/A;  . NOSE SURGERY    . SALPINGOOPHORECTOMY  06/21/2011   Procedure: SALPINGO OOPHERECTOMY;  Surgeon: Maisie Fus;  Location: [facility];  Service: Gynecology;  Laterality: Bilateral;  . VAGINAL PROLAPSE REPAIR  06/21/2011   Procedure: SACROPEXY;  Surgeon: Maisie Fus;  Location: [facility];  Service: Gynecology;  Laterality: N/A;  sacrospinous ligament suspension    There were no vitals filed for this visit.  Subjective Assessment - 10/18/17 1101    Subjective  Pt reported she fell off a stool in a restaurant. The stool  didn't have a back and pt leaned too far back and hit head on floor. Pt went to ED and received 4 staples in her head to close laceration, and is on anti-biotics to prevent infection. Imaging was negative and MD told pt not to have her hair done but that she can participate in PT.     Pertinent History  2L of SpO2, osteoporosis, COPD, aortic valve replacement, HTN, HLD, DM, depression, diverticulosis, incontinence, glaucoma, 02/2011: 40-50% carotid stenosis, emphysema, CKD stage 3, CAD, hiatal hernia    Patient Stated Goals  She'd love to stop using SpO2 as it is heavy, she'd like to walk without an AD.    Currently in Pain?   No/denies                           Balance Exercises - 10/18/17 1112      Balance Exercises: Standing   Standing Eyes Opened  Wide (BOA);Foam/compliant surface;Other reps (comment) 50 reps ball toss in all directions with rehab tech.     Rockerboard  Anterior/posterior;10 reps;UE support;Lateral;Head turns;EO;EC;10 seconds;30 seconds    Other Standing Exercises  Pt performed in // bars with min guard to min A: cues and demo for technique. Pt required three standing rest breaks 2/2 SOB during session. However, pt's SpO2 on 2L of O2 via [place] remained >/=95% during standing exercises. However, pt's SpO2 on 2L of O2 was 86-88% upon arrival to session.  Intermittent UE support with pt progressing to no UE support.         PT Short Term Goals - 10/09/17 1154      PT SHORT TERM GOAL #1   Title  Pt will be IND in HEP to improve balance, endurance, strength, and flexibility. TARGET DATE FOR ALL STGS: 10/02/17    Status  Achieved      PT SHORT TERM GOAL #2   Title  Pt will improve DGI score to >/=16/24 to decr. falls risk.     Status  Achieved      PT SHORT TERM GOAL #3   Title  Pt will improve gait speed with LRAD, not holding SpO2 machine to >/=2.62 ft/sec. to safely amb. in the community.     Status  Not Met      PT SHORT TERM GOAL #4   Title  Pt will report zero falls over the last 2 weeks to improve safety.     Status  Not Met      PT SHORT TERM GOAL #5   Title  Perform 6MWT and write goals as indicated.     Status  Achieved      PT SHORT TERM GOAL #6   Title  Pt will amb. 500' with LRAD, not carrying SpO2 machine, at MOD I level to improve safety during functional mobility.     Status  Achieved        PT Long Term Goals - 09/12/17 1154      PT LONG TERM GOAL #1   Title  Pt will verbalize understanding fall prevention strategies to reduce falls risk. TARGET DATE FOR ALL LTGS: 10/30/17    Status  New      PT LONG TERM GOAL #2   Title  Pt will improve DGI  score to >/=20/24 to decr. falls risk.     Status  New      PT LONG TERM GOAL #3   Title  Pt will amb. 600' over  even/uneven terrain with LRAD, at MOD I level, including traversing curbs to improve safety during functional mobility.     Status  New      PT LONG TERM GOAL #4   Title  Pt will amb. 100', at MOD I level (slower speed), over even terrain to amb. safely at home.     Status  New      PT LONG TERM GOAL #5   Title  Pt will improve walking distance during 6MWT to 694' to improve endurance.     Status  New            Plan - 10/18/17 1152    Clinical Impression Statement  PT resumed OPPT neuro s/p fall and head laceration as all testing negative and pt's MD told pt to resume PT. Pt required seated rest break prior to initiating balance activities 2/2 decr. SpO2 (86-88%) at rest, however, once SpO2 improved >/=95% it did not decr. again during session. PT focused on balance activities to improve ankle and hip righting reactions 2/2 pt's hx of falls. Continue with POC.    Rehab Potential  Good    Clinical Impairments Affecting Rehab Potential  see above    PT Frequency  2x / week    PT Duration  8 weeks    PT Treatment/Interventions  ADLs/Self Care Home Management;Biofeedback;Therapeutic exercise;Manual techniques;Therapeutic activities;Functional mobility training;Stair training;Gait training;Patient/family education;Orthotic Fit/Training;DME Instruction;Neuromuscular re-education;Balance training;Vestibular    PT Next Visit Plan  High level balance and gait (hip and ankle strategies)    PT Home Exercise Plan  balance HEP given on 09-25-17    Consulted and Agree with Plan of Care  Patient       Patient will benefit from skilled therapeutic intervention in order to improve the following deficits and impairments:  Abnormal gait, Decreased endurance, Decreased knowledge of use of DME, Decreased balance, Decreased mobility, Impaired flexibility, Postural dysfunction, Decreased  strength  Visit Diagnosis: Unsteadiness on feet  Other abnormalities of gait and mobility  Muscle weakness (generalized)     Problem List Patient Active Problem List   Diagnosis Date Noted  . Pulmonary nodule, left 10/15/2017  . Anemia, chronic disease 12/07/2016  . Chronic obstructive pulmonary disease ([place])   . Chronic respiratory failure ([place]) 11/19/2016  . CKD (chronic kidney disease), stage III ([place]) 11/19/2016  . Diabetes mellitus type 2, uncontrolled ([place]) 11/19/2016  . Recurrent pneumonia 11/19/2016  . Acute on chronic diastolic heart failure ([place]) 11/19/2016  . Pulmonary HTN ([place]) 11/19/2016  . HLD (hyperlipidemia) 11/19/2016  . Hiatal hernia 05/01/2016  . History of bacterial pneumonia 05/01/2016  . COPD exacerbation ([place]) 11/18/2015  . Physical deconditioning 07/24/2013  . S/P AVR (aortic valve replacement) 07/23/2013  . S/P CABG x 1 07/23/2013  . Aortic stenosis 06/10/2013  . Essential hypertension 06/10/2013  . COPD GOLD II  10/26/2011    Miller,Jennifer L 10/18/2017, 11:54 AM  [facility] [address] [place], [place], [address] Phone: [phone]   Fax:  [phone]  Name: RON JUNCO MRN: [id number] Date of Birth: [birth year]  Geoffry Paradise, PT,DPT 10/18/17 11:56 AM Phone: [phone] Fax: [phone]

## 2017-10-18 NOTE — Telephone Encounter (Signed)
Will forward to DP to follow up on this call.

## 2017-10-19 ENCOUNTER — Telehealth: Payer: Self-pay | Admitting: Acute Care

## 2017-10-19 NOTE — Telephone Encounter (Signed)
[name], this pt is returning your call regarding CT results and possible biopsy.  Can you call her back regarding this?

## 2017-10-19 NOTE — Telephone Encounter (Signed)
I have called Ms. Whitehouse and told her that the reason Radiology wants to wait and re-scan in 3 months is that the nodule is in a location that makes it difficult to biopsy due to its small size. She verbalized understanding. I have called [facility] and scheduled her repeat scan for 11/19/2017. They will let the patient know the time. She verbalized understanding and had no further questions.

## 2017-10-19 NOTE — Telephone Encounter (Signed)
This patient is not a lung cancer screening patient. She is an MR patient I saw in clinic. I will call her re: biopsy, but please make sure you research this and route the patient's appropriately. Thanks so much!!

## 2017-10-19 NOTE — Telephone Encounter (Signed)
Thanks Sarah

## 2017-10-22 ENCOUNTER — Ambulatory Visit: Payer: Medicare Other | Admitting: Internal Medicine

## 2017-10-23 ENCOUNTER — Ambulatory Visit: Payer: Medicare Other | Attending: Family Medicine

## 2017-10-23 VITALS — HR 83

## 2017-10-23 DIAGNOSIS — R2689 Other abnormalities of gait and mobility: Secondary | ICD-10-CM | POA: Insufficient documentation

## 2017-10-23 DIAGNOSIS — M6281 Muscle weakness (generalized): Secondary | ICD-10-CM | POA: Diagnosis present

## 2017-10-23 DIAGNOSIS — R2681 Unsteadiness on feet: Secondary | ICD-10-CM | POA: Insufficient documentation

## 2017-10-23 NOTE — Therapy (Signed)
[facility] [address] [place], [place], [address] Phone: [phone]   Fax:  [phone]  Physical Therapy Treatment  Patient Details  Name: [name] MRN: [id number] Date of Birth: 05/15/1932 Referring Provider: Dr. Dema Severin   Encounter Date: 10/23/2017  PT End of Session - 10/23/17 0928    Visit Number  10    Number of Visits  17    Date for PT Re-Evaluation  11/03/17    Authorization Type  [employer] Medicare: G-CODE AND PN EVERY 10TH VISIT.     PT Start Time  0849    PT Stop Time  0931    PT Time Calculation (min)  42 min    Equipment Utilized During Treatment  -- min guard prn    Activity Tolerance  Patient limited by fatigue;Other (comment) and SOB    Behavior During Therapy  WFL for tasks assessed/performed       Past Medical History:  Diagnosis Date  . Anxiety   . Aortic stenosis   . Arthritis   . Asthma    uses inhaler as needed  . Depression   . Diabetes mellitus   . Emphysema of lung ([place])   . GERD (gastroesophageal reflux disease)   . Glaucoma   . Hyperlipidemia   . Hypertension   . [name]   . Murmur   . Nephrolithiasis     Past Surgical History:  Procedure Laterality Date  . ABDOMINAL HYSTERECTOMY    . ANKLE SURGERY  1998   d/t fx.  Left ankle  . ANTERIOR AND POSTERIOR REPAIR  06/21/2011   Procedure: ANTERIOR (CYSTOCELE) AND POSTERIOR REPAIR (RECTOCELE);  Surgeon: Maisie Fus;  Location: [facility];  Service: Gynecology;  Laterality: N/A;  . AORTIC VALVE REPLACEMENT N/A 07/17/2013   Procedure: AORTIC VALVE REPLACEMENT (AVR);  Surgeon: Ivin Poot, MD;  Location: [facility];  Service: Open Heart Surgery;  Laterality: N/A;  . CARDIAC CATHETERIZATION    . CARDIAC VALVE REPLACEMENT    . CORONARY ANGIOPLASTY    . CORONARY ARTERY BYPASS GRAFT N/A 07/17/2013   Procedure: CORONARY ARTERY BYPASS GRAFT, TIMES ONE, ON PUMP, USING RIGHT INTERNAL MAMMARY ARTERY.;  Surgeon: Ivin Poot, MD;  Location: [facility];  Service: Open Heart Surgery;  Laterality: N/A;  . FACIAL COSMETIC SURGERY     face lift  . INTRAOPERATIVE TRANSESOPHAGEAL ECHOCARDIOGRAM N/A 07/17/2013   Procedure: INTRAOPERATIVE TRANSESOPHAGEAL ECHOCARDIOGRAM;  Surgeon: Ivin Poot, MD;  Location: [facility];  Service: Open Heart Surgery;  Laterality: N/A;  . LAPAROSCOPIC ASSISTED VAGINAL HYSTERECTOMY  06/21/2011   Procedure: LAPAROSCOPIC ASSISTED VAGINAL HYSTERECTOMY;  Surgeon: Maisie Fus;  Location: [facility];  Service: Gynecology;  Laterality: N/A;  . LEFT AND RIGHT HEART CATHETERIZATION WITH CORONARY ANGIOGRAM N/A 06/27/2013   Procedure: LEFT AND RIGHT HEART CATHETERIZATION WITH CORONARY ANGIOGRAM;  Surgeon: Ramond Dial, MD;  Location: [facility] CATH [facility];  Service: Cardiovascular;  Laterality: N/A;  . NOSE SURGERY    . SALPINGOOPHORECTOMY  06/21/2011   Procedure: SALPINGO OOPHERECTOMY;  Surgeon: Maisie Fus;  Location: [facility];  Service: Gynecology;  Laterality: Bilateral;  . VAGINAL PROLAPSE REPAIR  06/21/2011   Procedure: SACROPEXY;  Surgeon: Maisie Fus;  Location: [facility];  Service: Gynecology;  Laterality: N/A;  sacrospinous ligament suspension    Vitals:   10/23/17 0904 10/23/17 0909  Pulse: 72 83  SpO2: 98% 95%    Subjective Assessment - 10/23/17 0852  Subjective  Pt presented with mask on face 2/2 cough, pt reported cough began after taking new medication and PA told pt to go back to previous medication today. Pt denied falls since last visit. Pt has appt. on Friday (10/26/17) to remove staples from head. MD also told pt to stop Amaryl medication, as it was prescribed by error.     Pertinent History  2L of SpO2, osteoporosis, COPD, aortic valve replacement, HTN, HLD, DM, depression, diverticulosis, incontinence, glaucoma, 02/2011: 40-50% carotid stenosis, emphysema, CKD stage 3, CAD, hiatal hernia    Patient Stated Goals  She'd love to stop using SpO2 as it is heavy, she'd like to walk without an  AD.    Currently in Pain?  No/denies                      [facility] Adult PT Treatment/Exercise - 10/23/17 0902      Ambulation/Gait   Ambulation/Gait  Yes    Ambulation/Gait Assistance  6: Modified independent (Device/Increase time);5: Supervision;4: Min guard    Ambulation/Gait Assistance Details  Circuit training: used rollator 2/2 SOB. First bout of amb: MOD I with rollator at slower speed 2/2 SOB. 2L of O2 via O2. 97% after amb. and HR: 85bpm.  Min guard during 1 LOB 2/2 decr. L toe clearance. Cues to improve L heel strike. SaO2 at end of last bout of amb. 94% and HR 84bpm.    Ambulation Distance (Feet)  230 Feet x2 and 300'    Assistive device  Rollator    Gait Pattern  Step-through pattern;Decreased stride length;Decreased trunk rotation    Ambulation Surface  Level;Indoor      Lumbar Exercises: Standing   Heel Raises  20 reps;1 second toe/heel raises with BUE support on rollator    Functional Squats  10 reps with UE support on rollator      Knee/Hip Exercises: Aerobic   Other Aerobic  SciFit: level 1 with all 4 extremities to improve strength and endurance. 5 minutes with RPMS >30 (lower today 2/2 SOB). Distance: 0.15 miles.              PT Education - 10/23/17 [phone]    Education provided  Yes    Education Details  PT discussed the importance of notifying MD of SOB and that she does not have a dry cough.    Person(s) Educated  Patient    Methods  Explanation    Comprehension  Verbalized understanding       PT Short Term Goals - 10/09/17 1154      PT SHORT TERM GOAL #1   Title  Pt will be IND in HEP to improve balance, endurance, strength, and flexibility. TARGET DATE FOR ALL STGS: 10/02/17    Status  Achieved      PT SHORT TERM GOAL #2   Title  Pt will improve DGI score to >/=16/24 to decr. falls risk.     Status  Achieved      PT SHORT TERM GOAL #3   Title  Pt will improve gait speed with LRAD, not holding SpO2 machine to >/=2.62 ft/sec. to safely  amb. in the community.     Status  Not Met      PT SHORT TERM GOAL #4   Title  Pt will report zero falls over the last 2 weeks to improve safety.     Status  Not Met      PT SHORT TERM GOAL #5   Title  Perform 6MWT and write goals as indicated.     Status  Achieved      PT SHORT TERM GOAL #6   Title  Pt will amb. 500' with LRAD, not carrying SpO2 machine, at MOD I level to improve safety during functional mobility.     Status  Achieved        PT Long Term Goals - 09/12/17 1154      PT LONG TERM GOAL #1   Title  Pt will verbalize understanding fall prevention strategies to reduce falls risk. TARGET DATE FOR ALL LTGS: 10/30/17    Status  New      PT LONG TERM GOAL #2   Title  Pt will improve DGI score to >/=20/24 to decr. falls risk.     Status  New      PT LONG TERM GOAL #3   Title  Pt will amb. 600' over even/uneven terrain with LRAD, at MOD I level, including traversing curbs to improve safety during functional mobility.     Status  New      PT LONG TERM GOAL #4   Title  Pt will amb. 100', at MOD I level (slower speed), over even terrain to amb. safely at home.     Status  New      PT LONG TERM GOAL #5   Title  Pt will improve walking distance during 6MWT to 694' to improve endurance.     Status  New            Plan - 10/23/17 1601    Clinical Impression Statement  Today's skilled session focused on circuit training to improve strength, endurance, and gait deviations. Pt required seated rest breaks 2/2 SOB and cough. PT explained the importance of notifying MD of productive cough vs. dry cough, as pt has hx of PNA and bronchitis. Pt would continue to benefit from skilled PT to improve safety during functional mobility.    Rehab Potential  Good    Clinical Impairments Affecting Rehab Potential  see above    PT Frequency  2x / week    PT Duration  8 weeks    PT Treatment/Interventions  ADLs/Self Care Home Management;Biofeedback;Therapeutic exercise;Manual  techniques;Therapeutic activities;Functional mobility training;Stair training;Gait training;Patient/family education;Orthotic Fit/Training;DME Instruction;Neuromuscular re-education;Balance training;Vestibular    PT Next Visit Plan  High level balance and gait (hip and ankle strategies)    PT Home Exercise Plan  balance HEP given on 09-25-17    Consulted and Agree with Plan of Care  Patient       Patient will benefit from skilled therapeutic intervention in order to improve the following deficits and impairments:  Abnormal gait, Decreased endurance, Decreased knowledge of use of DME, Decreased balance, Decreased mobility, Impaired flexibility, Postural dysfunction, Decreased strength  Visit Diagnosis: Other abnormalities of gait and mobility  Muscle weakness (generalized)  Unsteadiness on feet     Problem List Patient Active Problem List   Diagnosis Date Noted  . Pulmonary nodule, left 10/15/2017  . Anemia, chronic disease 12/07/2016  . Chronic obstructive pulmonary disease ([place])   . Chronic respiratory failure ([place]) 11/19/2016  . CKD (chronic kidney disease), stage III ([place]) 11/19/2016  . Diabetes mellitus type 2, uncontrolled ([place]) 11/19/2016  . Recurrent pneumonia 11/19/2016  . Acute on chronic diastolic heart failure ([place]) 11/19/2016  . Pulmonary HTN ([place]) 11/19/2016  . HLD (hyperlipidemia) 11/19/2016  . Hiatal hernia 05/01/2016  . History of bacterial pneumonia 05/01/2016  . COPD exacerbation ([place]) 11/18/2015  .  Physical deconditioning 07/24/2013  . S/P AVR (aortic valve replacement) 07/23/2013  . S/P CABG x 1 07/23/2013  . Aortic stenosis 06/10/2013  . Essential hypertension 06/10/2013  . COPD GOLD II  10/26/2011    Clarivel Callaway L 10/23/2017, 9:32 AM  [facility] [address] [place], [place], [address] Phone: [phone]   Fax:  [phone]  Name: [name] MRN: [id number] Date of Birth:  December 23, 1931  Geoffry Paradise, PT,DPT 10/23/17 9:33 AM Phone: [phone] Fax: [phone]

## 2017-10-25 ENCOUNTER — Ambulatory Visit: Payer: Medicare Other

## 2017-10-26 ENCOUNTER — Encounter (HOSPITAL_COMMUNITY): Payer: Self-pay | Admitting: Emergency Medicine

## 2017-10-26 ENCOUNTER — Other Ambulatory Visit: Payer: Self-pay | Admitting: Family Medicine

## 2017-10-26 ENCOUNTER — Ambulatory Visit
Admission: RE | Admit: 2017-10-26 | Discharge: 2017-10-26 | Disposition: A | Discharge status: Home / Self Care | Payer: Medicare Other | Source: Ambulatory Visit | Attending: Family Medicine | Admitting: Family Medicine

## 2017-10-26 DIAGNOSIS — J441 Chronic obstructive pulmonary disease with (acute) exacerbation: Principal | ICD-10-CM | POA: Diagnosis present

## 2017-10-26 DIAGNOSIS — R0602 Shortness of breath: Secondary | ICD-10-CM

## 2017-10-26 DIAGNOSIS — F419 Anxiety disorder, unspecified: Secondary | ICD-10-CM | POA: Diagnosis present

## 2017-10-26 DIAGNOSIS — Z9981 Dependence on supplemental oxygen: Secondary | ICD-10-CM

## 2017-10-26 DIAGNOSIS — I2510 Atherosclerotic heart disease of native coronary artery without angina pectoris: Secondary | ICD-10-CM | POA: Diagnosis present

## 2017-10-26 DIAGNOSIS — K922 Gastrointestinal hemorrhage, unspecified: Secondary | ICD-10-CM | POA: Diagnosis present

## 2017-10-26 DIAGNOSIS — I350 Nonrheumatic aortic (valve) stenosis: Secondary | ICD-10-CM | POA: Diagnosis present

## 2017-10-26 DIAGNOSIS — E785 Hyperlipidemia, unspecified: Secondary | ICD-10-CM | POA: Diagnosis present

## 2017-10-26 DIAGNOSIS — R0902 Hypoxemia: Secondary | ICD-10-CM | POA: Diagnosis not present

## 2017-10-26 DIAGNOSIS — J9621 Acute and chronic respiratory failure with hypoxia: Secondary | ICD-10-CM | POA: Diagnosis present

## 2017-10-26 DIAGNOSIS — K589 Irritable bowel syndrome without diarrhea: Secondary | ICD-10-CM | POA: Diagnosis present

## 2017-10-26 DIAGNOSIS — Z7951 Long term (current) use of inhaled steroids: Secondary | ICD-10-CM

## 2017-10-26 DIAGNOSIS — F329 Major depressive disorder, single episode, unspecified: Secondary | ICD-10-CM | POA: Diagnosis present

## 2017-10-26 DIAGNOSIS — Z90710 Acquired absence of both cervix and uterus: Secondary | ICD-10-CM

## 2017-10-26 DIAGNOSIS — Z23 Encounter for immunization: Secondary | ICD-10-CM

## 2017-10-26 DIAGNOSIS — Z7984 Long term (current) use of oral hypoglycemic drugs: Secondary | ICD-10-CM

## 2017-10-26 DIAGNOSIS — Z951 Presence of aortocoronary bypass graft: Secondary | ICD-10-CM

## 2017-10-26 DIAGNOSIS — Z7982 Long term (current) use of aspirin: Secondary | ICD-10-CM

## 2017-10-26 DIAGNOSIS — E1122 Type 2 diabetes mellitus with diabetic chronic kidney disease: Secondary | ICD-10-CM | POA: Diagnosis present

## 2017-10-26 DIAGNOSIS — Z87891 Personal history of nicotine dependence: Secondary | ICD-10-CM

## 2017-10-26 DIAGNOSIS — E872 Acidosis: Secondary | ICD-10-CM | POA: Diagnosis present

## 2017-10-26 DIAGNOSIS — R911 Solitary pulmonary nodule: Secondary | ICD-10-CM | POA: Diagnosis present

## 2017-10-26 DIAGNOSIS — H409 Unspecified glaucoma: Secondary | ICD-10-CM | POA: Diagnosis present

## 2017-10-26 DIAGNOSIS — J440 Chronic obstructive pulmonary disease with acute lower respiratory infection: Secondary | ICD-10-CM | POA: Diagnosis present

## 2017-10-26 DIAGNOSIS — J208 Acute bronchitis due to other specified organisms: Secondary | ICD-10-CM | POA: Diagnosis present

## 2017-10-26 DIAGNOSIS — I130 Hypertensive heart and chronic kidney disease with heart failure and stage 1 through stage 4 chronic kidney disease, or unspecified chronic kidney disease: Secondary | ICD-10-CM | POA: Diagnosis present

## 2017-10-26 DIAGNOSIS — K219 Gastro-esophageal reflux disease without esophagitis: Secondary | ICD-10-CM | POA: Diagnosis present

## 2017-10-26 DIAGNOSIS — Z953 Presence of xenogenic heart valve: Secondary | ICD-10-CM

## 2017-10-26 DIAGNOSIS — Z79899 Other long term (current) drug therapy: Secondary | ICD-10-CM

## 2017-10-26 DIAGNOSIS — N183 Chronic kidney disease, stage 3 (moderate): Secondary | ICD-10-CM | POA: Diagnosis present

## 2017-10-26 DIAGNOSIS — I5032 Chronic diastolic (congestive) heart failure: Secondary | ICD-10-CM | POA: Diagnosis present

## 2017-10-26 DIAGNOSIS — D500 Iron deficiency anemia secondary to blood loss (chronic): Secondary | ICD-10-CM | POA: Diagnosis present

## 2017-10-26 DIAGNOSIS — Z825 Family history of asthma and other chronic lower respiratory diseases: Secondary | ICD-10-CM

## 2017-10-26 DIAGNOSIS — Z9861 Coronary angioplasty status: Secondary | ICD-10-CM

## 2017-10-26 LAB — COMPREHENSIVE METABOLIC PANEL
ALK PHOS: 64 U/L (ref 38–126)
ALT: 41 U/L (ref 14–54)
ANION GAP: 12 (ref 5–15)
AST: 36 U/L (ref 15–41)
Albumin: 3.9 g/dL (ref 3.5–5.0)
BILIRUBIN TOTAL: 0.5 mg/dL (ref 0.3–1.2)
BUN: 32 mg/dL — ABNORMAL HIGH (ref 6–20)
CALCIUM: 9 mg/dL (ref 8.9–10.3)
CO2: 21 mmol/L — AB (ref 22–32)
CREATININE: 1.3 mg/dL — AB (ref 0.44–1.00)
Chloride: 105 mmol/L (ref 101–111)
GFR, EST AFRICAN AMERICAN: 43 mL/min — AB (ref >60)
GFR, EST NON AFRICAN AMERICAN: 37 mL/min — AB (ref >60)
Glucose, Bld: 184 mg/dL — ABNORMAL HIGH (ref 65–99)
Potassium: 3.9 mmol/L (ref 3.5–5.1)
Sodium: 138 mmol/L (ref 135–145)
TOTAL PROTEIN: 7.2 g/dL (ref 6.5–8.1)

## 2017-10-26 LAB — CBC
HCT: 27.5 % — ABNORMAL LOW (ref 36.0–46.0)
HEMOGLOBIN: 8.1 g/dL — AB (ref 12.0–15.0)
MCH: 22.6 pg — AB (ref 26.0–34.0)
MCHC: 29.5 g/dL — ABNORMAL LOW (ref 30.0–36.0)
MCV: 76.8 fL — ABNORMAL LOW (ref 78.0–100.0)
PLATELETS: 260 10*3/uL (ref 150–400)
RBC: 3.58 MIL/uL — AB (ref 3.87–5.11)
RDW: 16.1 % — ABNORMAL HIGH (ref 11.5–15.5)
WBC: 10.3 10*3/uL (ref 4.0–10.5)

## 2017-10-26 LAB — PROTIME-INR
INR: 1.07
PROTHROMBIN TIME: 13.8 s (ref 11.4–15.2)

## 2017-10-26 LAB — TYPE AND SCREEN
ABO/RH(D): A POS
ANTIBODY SCREEN: NEGATIVE

## 2017-10-26 NOTE — ED Triage Notes (Signed)
Pt sent by PCP for Hgb 8.1. Pt reports she was dx with bronchitis, had routine bloodwork drawn and they noted her Hgb to be low. Pt has no complaints besides cough. Denies abd pain, denies blood in stool.

## 2017-10-27 ENCOUNTER — Other Ambulatory Visit: Payer: Self-pay

## 2017-10-27 ENCOUNTER — Emergency Department (HOSPITAL_COMMUNITY): Payer: Medicare Other

## 2017-10-27 ENCOUNTER — Encounter (HOSPITAL_COMMUNITY): Payer: Self-pay | Admitting: Family Medicine

## 2017-10-27 ENCOUNTER — Inpatient Hospital Stay (HOSPITAL_COMMUNITY)
Admission: EM | Admit: 2017-10-27 | Discharge: 2017-10-29 | DRG: 190 | Disposition: A | Discharge status: Home Health | Payer: Medicare Other | Attending: Internal Medicine | Admitting: Internal Medicine

## 2017-10-27 DIAGNOSIS — D509 Iron deficiency anemia, unspecified: Secondary | ICD-10-CM | POA: Diagnosis present

## 2017-10-27 DIAGNOSIS — J441 Chronic obstructive pulmonary disease with (acute) exacerbation: Secondary | ICD-10-CM | POA: Diagnosis present

## 2017-10-27 DIAGNOSIS — Z23 Encounter for immunization: Secondary | ICD-10-CM | POA: Diagnosis not present

## 2017-10-27 DIAGNOSIS — Z825 Family history of asthma and other chronic lower respiratory diseases: Secondary | ICD-10-CM | POA: Diagnosis not present

## 2017-10-27 DIAGNOSIS — I2510 Atherosclerotic heart disease of native coronary artery without angina pectoris: Secondary | ICD-10-CM | POA: Diagnosis present

## 2017-10-27 DIAGNOSIS — F419 Anxiety disorder, unspecified: Secondary | ICD-10-CM | POA: Diagnosis present

## 2017-10-27 DIAGNOSIS — I130 Hypertensive heart and chronic kidney disease with heart failure and stage 1 through stage 4 chronic kidney disease, or unspecified chronic kidney disease: Secondary | ICD-10-CM | POA: Diagnosis present

## 2017-10-27 DIAGNOSIS — D500 Iron deficiency anemia secondary to blood loss (chronic): Secondary | ICD-10-CM | POA: Diagnosis present

## 2017-10-27 DIAGNOSIS — N183 Chronic kidney disease, stage 3 unspecified: Secondary | ICD-10-CM | POA: Diagnosis present

## 2017-10-27 DIAGNOSIS — J9621 Acute and chronic respiratory failure with hypoxia: Secondary | ICD-10-CM | POA: Diagnosis present

## 2017-10-27 DIAGNOSIS — E1165 Type 2 diabetes mellitus with hyperglycemia: Secondary | ICD-10-CM

## 2017-10-27 DIAGNOSIS — Z7982 Long term (current) use of aspirin: Secondary | ICD-10-CM | POA: Diagnosis not present

## 2017-10-27 DIAGNOSIS — Z87891 Personal history of nicotine dependence: Secondary | ICD-10-CM | POA: Diagnosis not present

## 2017-10-27 DIAGNOSIS — E1122 Type 2 diabetes mellitus with diabetic chronic kidney disease: Secondary | ICD-10-CM | POA: Diagnosis present

## 2017-10-27 DIAGNOSIS — Z86010 Personal history of colonic polyps: Secondary | ICD-10-CM | POA: Diagnosis not present

## 2017-10-27 DIAGNOSIS — R0902 Hypoxemia: Secondary | ICD-10-CM

## 2017-10-27 DIAGNOSIS — D649 Anemia, unspecified: Secondary | ICD-10-CM | POA: Diagnosis not present

## 2017-10-27 DIAGNOSIS — Z7951 Long term (current) use of inhaled steroids: Secondary | ICD-10-CM | POA: Diagnosis not present

## 2017-10-27 DIAGNOSIS — I5032 Chronic diastolic (congestive) heart failure: Secondary | ICD-10-CM | POA: Diagnosis present

## 2017-10-27 DIAGNOSIS — Z951 Presence of aortocoronary bypass graft: Secondary | ICD-10-CM | POA: Diagnosis not present

## 2017-10-27 DIAGNOSIS — K219 Gastro-esophageal reflux disease without esophagitis: Secondary | ICD-10-CM | POA: Diagnosis present

## 2017-10-27 DIAGNOSIS — Z79899 Other long term (current) drug therapy: Secondary | ICD-10-CM | POA: Diagnosis not present

## 2017-10-27 DIAGNOSIS — I2583 Coronary atherosclerosis due to lipid rich plaque: Secondary | ICD-10-CM | POA: Diagnosis not present

## 2017-10-27 DIAGNOSIS — Z7984 Long term (current) use of oral hypoglycemic drugs: Secondary | ICD-10-CM | POA: Diagnosis not present

## 2017-10-27 DIAGNOSIS — R195 Other fecal abnormalities: Secondary | ICD-10-CM

## 2017-10-27 DIAGNOSIS — IMO0002 Reserved for concepts with insufficient information to code with codable children: Secondary | ICD-10-CM | POA: Diagnosis present

## 2017-10-27 DIAGNOSIS — K589 Irritable bowel syndrome without diarrhea: Secondary | ICD-10-CM | POA: Diagnosis present

## 2017-10-27 DIAGNOSIS — Z90710 Acquired absence of both cervix and uterus: Secondary | ICD-10-CM | POA: Diagnosis not present

## 2017-10-27 DIAGNOSIS — K922 Gastrointestinal hemorrhage, unspecified: Secondary | ICD-10-CM | POA: Diagnosis present

## 2017-10-27 DIAGNOSIS — H409 Unspecified glaucoma: Secondary | ICD-10-CM | POA: Diagnosis present

## 2017-10-27 DIAGNOSIS — F329 Major depressive disorder, single episode, unspecified: Secondary | ICD-10-CM | POA: Diagnosis present

## 2017-10-27 DIAGNOSIS — E785 Hyperlipidemia, unspecified: Secondary | ICD-10-CM | POA: Diagnosis present

## 2017-10-27 DIAGNOSIS — E872 Acidosis: Secondary | ICD-10-CM | POA: Diagnosis present

## 2017-10-27 LAB — RETICULOCYTES
RBC.: 3.63 MIL/uL — ABNORMAL LOW (ref 3.87–5.11)
RETIC COUNT ABSOLUTE: 32.7 10*3/uL (ref 19.0–186.0)
Retic Ct Pct: 0.9 % (ref 0.4–3.1)

## 2017-10-27 LAB — I-STAT TROPONIN, ED: TROPONIN I, POC: 0 ng/mL (ref 0.00–0.08)

## 2017-10-27 LAB — GLUCOSE, CAPILLARY
GLUCOSE-CAPILLARY: 119 mg/dL — AB (ref 65–99)
GLUCOSE-CAPILLARY: 200 mg/dL — AB (ref 65–99)

## 2017-10-27 LAB — VITAMIN B12: VITAMIN B 12: 681 pg/mL (ref 180–914)

## 2017-10-27 LAB — BASIC METABOLIC PANEL
Anion gap: 15 (ref 5–15)
BUN: 34 mg/dL — AB (ref 6–20)
CHLORIDE: 106 mmol/L (ref 101–111)
CO2: 17 mmol/L — ABNORMAL LOW (ref 22–32)
Calcium: 8.6 mg/dL — ABNORMAL LOW (ref 8.9–10.3)
Creatinine, Ser: 1.29 mg/dL — ABNORMAL HIGH (ref 0.44–1.00)
GFR calc Af Amer: 43 mL/min — ABNORMAL LOW (ref >60)
GFR calc non Af Amer: 37 mL/min — ABNORMAL LOW (ref >60)
GLUCOSE: 280 mg/dL — AB (ref 65–99)
POTASSIUM: 4.1 mmol/L (ref 3.5–5.1)
Sodium: 138 mmol/L (ref 135–145)

## 2017-10-27 LAB — CBG MONITORING, ED
GLUCOSE-CAPILLARY: 288 mg/dL — AB (ref 65–99)
Glucose-Capillary: 285 mg/dL — ABNORMAL HIGH (ref 65–99)

## 2017-10-27 LAB — HEMOGLOBIN: Hemoglobin: 7.9 g/dL — ABNORMAL LOW (ref 12.0–15.0)

## 2017-10-27 LAB — IRON AND TIBC
Iron: 17 ug/dL — ABNORMAL LOW (ref 28–170)
Saturation Ratios: 4 % — ABNORMAL LOW (ref 10.4–31.8)
TIBC: 410 ug/dL (ref 250–450)
UIBC: 393 ug/dL

## 2017-10-27 LAB — POC OCCULT BLOOD, ED: Fecal Occult Bld: POSITIVE — AB

## 2017-10-27 LAB — HEMOGLOBIN A1C
HEMOGLOBIN A1C: 6.9 % — AB (ref 4.8–5.6)
MEAN PLASMA GLUCOSE: 151.33 mg/dL

## 2017-10-27 LAB — FOLATE: FOLATE: 39 ng/mL (ref >5.9)

## 2017-10-27 LAB — BRAIN NATRIURETIC PEPTIDE: B Natriuretic Peptide: 175.9 pg/mL — ABNORMAL HIGH (ref 0.0–100.0)

## 2017-10-27 LAB — FERRITIN: Ferritin: 45 ng/mL (ref 11–307)

## 2017-10-27 LAB — HEMATOCRIT: HCT: 26.4 % — ABNORMAL LOW (ref 36.0–46.0)

## 2017-10-27 MED ORDER — ALBUTEROL SULFATE (2.5 MG/3ML) 0.083% IN NEBU: 2.5000 mg | INHALATION_SOLUTION | RESPIRATORY_TRACT | Status: DC | PRN

## 2017-10-27 MED ORDER — INSULIN ASPART 100 UNIT/ML ~~LOC~~ SOLN
0.0000 [IU] | Freq: Three times a day (TID) | SUBCUTANEOUS | Status: DC
  Administered 2017-10-28: 3 [IU] via SUBCUTANEOUS
  Administered 2017-10-28 (×2): 2 [IU] via SUBCUTANEOUS
  Administered 2017-10-29: 3 [IU] via SUBCUTANEOUS

## 2017-10-27 MED ORDER — BENZONATATE 100 MG PO CAPS
200.0000 mg | ORAL_CAPSULE | Freq: Three times a day (TID) | ORAL | Status: DC | PRN
  Administered 2017-10-27 – 2017-10-29 (×4): 200 mg via ORAL
  Filled 2017-10-27 (×4): qty 2

## 2017-10-27 MED ORDER — BUPROPION HCL ER (XL) 150 MG PO TB24
150.0000 mg | ORAL_TABLET | Freq: Every day | ORAL | Status: DC
  Administered 2017-10-27 – 2017-10-29 (×3): 150 mg via ORAL
  Filled 2017-10-27 (×3): qty 1

## 2017-10-27 MED ORDER — BUDESONIDE 0.25 MG/2ML IN SUSP
0.2500 mg | Freq: Two times a day (BID) | RESPIRATORY_TRACT | Status: DC
  Administered 2017-10-27 – 2017-10-29 (×4): 0.25 mg via RESPIRATORY_TRACT
  Filled 2017-10-27 (×4): qty 2

## 2017-10-27 MED ORDER — ONDANSETRON HCL 4 MG PO TABS: 4.0000 mg | ORAL_TABLET | Freq: Four times a day (QID) | ORAL | Status: DC | PRN

## 2017-10-27 MED ORDER — METHYLPREDNISOLONE SODIUM SUCC 40 MG IJ SOLR
40.0000 mg | Freq: Three times a day (TID) | INTRAMUSCULAR | Status: DC
  Administered 2017-10-27 – 2017-10-28 (×4): 40 mg via INTRAVENOUS
  Filled 2017-10-27 (×4): qty 1

## 2017-10-27 MED ORDER — SODIUM CHLORIDE 0.9% FLUSH
3.0000 mL | Freq: Two times a day (BID) | INTRAVENOUS | Status: DC
  Administered 2017-10-27 – 2017-10-28 (×4): 3 mL via INTRAVENOUS

## 2017-10-27 MED ORDER — ACETAMINOPHEN 325 MG PO TABS
650.0000 mg | ORAL_TABLET | Freq: Four times a day (QID) | ORAL | Status: DC | PRN
  Administered 2017-10-27 – 2017-10-29 (×3): 650 mg via ORAL
  Filled 2017-10-27 (×3): qty 2

## 2017-10-27 MED ORDER — ACETAMINOPHEN 650 MG RE SUPP: 650.0000 mg | Freq: Four times a day (QID) | RECTAL | Status: DC | PRN

## 2017-10-27 MED ORDER — DEXTROSE 5 % IV SOLN
500.0000 mg | INTRAVENOUS | Status: DC
  Administered 2017-10-27 – 2017-10-28 (×2): 500 mg via INTRAVENOUS
  Filled 2017-10-27 (×2): qty 500

## 2017-10-27 MED ORDER — IPRATROPIUM-ALBUTEROL 0.5-2.5 (3) MG/3ML IN SOLN
5.0000 mL | Freq: Once | RESPIRATORY_TRACT | Status: DC
  Administered 2017-10-27: 5 mL via RESPIRATORY_TRACT
  Filled 2017-10-27: qty 3

## 2017-10-27 MED ORDER — SODIUM CHLORIDE 0.9 % IV SOLN: 250.0000 mL | INTRAVENOUS | Status: DC | PRN

## 2017-10-27 MED ORDER — IPRATROPIUM-ALBUTEROL 0.5-2.5 (3) MG/3ML IN SOLN
3.0000 mL | Freq: Four times a day (QID) | RESPIRATORY_TRACT | Status: DC
  Administered 2017-10-27 – 2017-10-29 (×10): 3 mL via RESPIRATORY_TRACT
  Filled 2017-10-27 (×12): qty 3

## 2017-10-27 MED ORDER — ALBUTEROL (5 MG/ML) CONTINUOUS INHALATION SOLN
10.0000 mg/h | INHALATION_SOLUTION | Freq: Once | RESPIRATORY_TRACT | Status: AC
  Administered 2017-10-27: 10 mg/h via RESPIRATORY_TRACT
  Filled 2017-10-27: qty 20

## 2017-10-27 MED ORDER — INSULIN ASPART 100 UNIT/ML ~~LOC~~ SOLN: 0.0000 [IU] | Freq: Every day | SUBCUTANEOUS | Status: DC

## 2017-10-27 MED ORDER — SODIUM CHLORIDE 0.9% FLUSH: 3.0000 mL | INTRAVENOUS | Status: DC | PRN

## 2017-10-27 MED ORDER — INSULIN ASPART 100 UNIT/ML ~~LOC~~ SOLN
0.0000 [IU] | SUBCUTANEOUS | Status: DC
  Administered 2017-10-27 (×2): 5 [IU] via SUBCUTANEOUS
  Filled 2017-10-27 (×2): qty 1

## 2017-10-27 MED ORDER — INSULIN GLARGINE 100 UNIT/ML ~~LOC~~ SOLN
10.0000 [IU] | SUBCUTANEOUS | Status: DC
  Administered 2017-10-27 – 2017-10-28 (×2): 10 [IU] via SUBCUTANEOUS
  Filled 2017-10-27 (×3): qty 0.1

## 2017-10-27 MED ORDER — MAGNESIUM OXIDE 400 (241.3 MG) MG PO TABS
400.0000 mg | ORAL_TABLET | Freq: Every day | ORAL | Status: DC
  Administered 2017-10-27 – 2017-10-29 (×3): 400 mg via ORAL
  Filled 2017-10-27 (×3): qty 1

## 2017-10-27 MED ORDER — ASPIRIN EC 81 MG PO TBEC: 81.0000 mg | DELAYED_RELEASE_TABLET | Freq: Every day | ORAL | Status: DC

## 2017-10-27 MED ORDER — IRBESARTAN 150 MG PO TABS
150.0000 mg | ORAL_TABLET | Freq: Every day | ORAL | Status: DC
  Administered 2017-10-28 – 2017-10-29 (×2): 150 mg via ORAL
  Filled 2017-10-27 (×3): qty 1

## 2017-10-27 MED ORDER — AMLODIPINE BESYLATE 10 MG PO TABS
10.0000 mg | ORAL_TABLET | Freq: Every day | ORAL | Status: DC
  Administered 2017-10-27 – 2017-10-29 (×3): 10 mg via ORAL
  Filled 2017-10-27 (×2): qty 1
  Filled 2017-10-27: qty 2

## 2017-10-27 MED ORDER — METHYLPREDNISOLONE SODIUM SUCC 125 MG IJ SOLR
125.0000 mg | Freq: Once | INTRAMUSCULAR | Status: AC
  Administered 2017-10-27: 125 mg via INTRAVENOUS
  Filled 2017-10-27: qty 2

## 2017-10-27 MED ORDER — MAGNESIUM 250 MG PO TABS: 250.0000 mg | ORAL_TABLET | Freq: Every day | ORAL | Status: DC

## 2017-10-27 MED ORDER — VITAMIN D 1000 UNITS PO TABS
1000.0000 [IU] | ORAL_TABLET | Freq: Every day | ORAL | Status: DC
  Administered 2017-10-27 – 2017-10-28 (×2): 1000 [IU] via ORAL
  Filled 2017-10-27 (×2): qty 1

## 2017-10-27 MED ORDER — SERTRALINE HCL 100 MG PO TABS
100.0000 mg | ORAL_TABLET | Freq: Every morning | ORAL | Status: DC
  Administered 2017-10-27 – 2017-10-29 (×3): 100 mg via ORAL
  Filled 2017-10-27 (×4): qty 1

## 2017-10-27 MED ORDER — PRAVASTATIN SODIUM 40 MG PO TABS
40.0000 mg | ORAL_TABLET | Freq: Every evening | ORAL | Status: DC
  Administered 2017-10-27 – 2017-10-28 (×2): 40 mg via ORAL
  Filled 2017-10-27 (×2): qty 1

## 2017-10-27 MED ORDER — ONDANSETRON HCL 4 MG/2ML IJ SOLN: 4.0000 mg | Freq: Four times a day (QID) | INTRAMUSCULAR | Status: DC | PRN

## 2017-10-27 MED ORDER — SODIUM CHLORIDE 0.9% FLUSH
3.0000 mL | Freq: Two times a day (BID) | INTRAVENOUS | Status: DC
  Administered 2017-10-27: 3 mL via INTRAVENOUS

## 2017-10-27 MED ORDER — GUAIFENESIN 100 MG/5ML PO SYRP
50.0000 mg | ORAL_SOLUTION | Freq: Three times a day (TID) | ORAL | Status: DC | PRN
  Filled 2017-10-27: qty 5

## 2017-10-27 MED ORDER — ORAL CARE MOUTH RINSE
15.0000 mL | Freq: Two times a day (BID) | OROMUCOSAL | Status: DC
  Administered 2017-10-28: 15 mL via OROMUCOSAL

## 2017-10-27 MED ORDER — FLUTICASONE PROPIONATE HFA 220 MCG/ACT IN AERO: 2.0000 | INHALATION_SPRAY | Freq: Two times a day (BID) | RESPIRATORY_TRACT | Status: DC

## 2017-10-27 MED ORDER — PANTOPRAZOLE SODIUM 40 MG IV SOLR
40.0000 mg | Freq: Two times a day (BID) | INTRAVENOUS | Status: DC
  Administered 2017-10-27 – 2017-10-28 (×2): 40 mg via INTRAVENOUS
  Filled 2017-10-27 (×3): qty 40

## 2017-10-27 NOTE — ED Notes (Signed)
Pt is home O2 dependent at 2.5 L SPO2 decreases while in bed to 88 SPO2 increased to 4L with SPO2 up to 95%, pt refuses to ambulate at this time.

## 2017-10-27 NOTE — Progress Notes (Addendum)
PROGRESS NOTE   [name]  GEX:[id number]    DOB: [birth year]    DOA: 10/27/2017  PCP: Harlan Stains, MD   I have briefly reviewed patients previous medical records in [facility]  Brief Narrative:  84 year old female with PMH of COPD, chronic hypoxic respiratory failure on home oxygen 2 L/min, DM 2, HTN, HLD, CAD, CABG, aortic stenosis status post bioprosthetic AVR presented to ED with 3-4 weeks history of progressive generalized weakness and few days history of cough, dyspnea that failed outpatient treatment with prednisone taper.  Lab work by PCP showed hemoglobin 8.1, lower than prior and advised to come to the hospital.  Admitted for COPD exacerbation and microcytic anemia, possibly symptomatic.   Assessment & Plan:   Principal Problem:   COPD exacerbation ([place]) Active Problems:   Acute on chronic respiratory failure with hypoxia (HCC)   CKD (chronic kidney disease), stage III (HCC)   Diabetes mellitus type 2, uncontrolled (HCC)   Chronic diastolic CHF (congestive heart failure) (HCC)   CAD (coronary artery disease)   Microcytic anemia   Anemia due to GI blood loss   COPD with acute exacerbation ([place])   1. COPD exacerbation: Possibly precipitated by acute viral bronchitis.  Treating with azithromycin, bronchodilator nebulizations, IV Solu-Medrol.  Add flutter valve.  Somewhat better than on admission. 2. Acute on chronic hypoxic respiratory failure: More than prior home oxygen needs.  Secondary to problem #1 complicating anemia.  Treatment as above and titrate oxygen to prior home levels. 3. Microcytic anemia/FOBT +: Hemoglobin 8.1 on admission, down from 10.7 in March 2018.  Denies overt GI bleed.  FOBT +.  Last colonoscopy by [facility] in 2013 showed diverticulosis.?  Related to slow GI bleed and chronic kidney disease.  Rule out iron deficiency.  Check anemia panel.  Hemoglobin 7.9.  Follow CBCs in a.m. and if hemoglobin continues to drop then consider transfusion and  GI consultation.  If stable, then outpatient GI consultation. 4. Type II DM: A1c 8 in March 2018.  Hold oral hypoglycemics.  Low-dose Lantus, SSI.  Adjust insulins as needed. 5. Essential hypertension: Controlled. 6. Anxiety and depression: Continue Zoloft and Wellbutrin. 7. CAD status post CABG: No chest pain reported. 8. Status post bioprosthetic AVR: Aspirin currently on hold due to problem #3. 9. Stage III chronic kidney disease: Creatinine likely at baseline.  Follow BMP periodically.     DVT prophylaxis: SCDs Code Status: Full Family Communication: None at bedside Disposition: DC home pending clinical improvement.   Consultants:  None  Procedures:  None  Antimicrobials:  Azithromycin   Subjective: Feels better.  Feels stronger.  No dizziness, lightheadedness or chest pain reported.  Dyspnea and cough have improved since admission.  Reports exposure to grandkids with URTI symptoms.  ROS: Denies nausea, vomiting, melena or rectal bleeding.  Appetite is stable.  Weight stable.  Objective:  Vitals:   10/27/17 0727 10/27/17 1015 10/27/17 1100 10/27/17 1130  BP:  (!) 148/65 140/72 (!) 127/49  Pulse:  87 83 80  Resp:  (!) 21 (!) 22 19  Temp:      TempSrc:      SpO2: 98% 100% 100% 100%  Weight:      Height:        Examination:  General exam: Elderly female, small built, frail, sitting up comfortably in bed without distress. Respiratory system: Slightly harsh breath sounds/distant breath sounds with scattered occasional expiratory rhonchi but no crackles. Respiratory effort normal. Cardiovascular system: S1 & S2 heard,  RRR. No JVD, rubs, gallops or clicks. No pedal edema.  Grade 2 x 6 systolic ejection murmur best heard at apex.   Gastrointestinal system: Abdomen is nondistended, soft and nontender. No organomegaly or masses felt. Normal bowel sounds heard. Central nervous system: Alert and oriented. No focal neurological deficits. Extremities: Symmetric 5 x 5  power. Skin: No rashes, lesions or ulcers Psychiatry: Judgement and insight appear normal. Mood & affect appropriate.     Data Reviewed: I have personally reviewed following labs and imaging studies  CBC: Recent Labs  Lab 10/26/17 1937 10/27/17 0620  WBC 10.3  --   HGB 8.1* 7.9*  HCT 27.5* 26.4*  MCV 76.8*  --   PLT 260  --    Basic Metabolic Panel: Recent Labs  Lab 10/26/17 1937 10/27/17 0620  NA 138 138  K 3.9 4.1  CL 105 106  CO2 21* 17*  GLUCOSE 184* 280*  BUN 32* 34*  CREATININE 1.30* 1.29*  CALCIUM 9.0 8.6*   Liver Function Tests: Recent Labs  Lab 10/26/17 1937  AST 36  ALT 41  ALKPHOS 64  BILITOT 0.5  PROT 7.2  ALBUMIN 3.9   Coagulation Profile: Recent Labs  Lab 10/26/17 1937  INR 1.07   Cardiac Enzymes: No results for input(s): CKTOTAL, CKMB, CKMBINDEX, TROPONINI in the last 168 hours. HbA1C: No results for input(s): HGBA1C in the last 72 hours. CBG: Recent Labs  Lab 10/27/17 0742  GLUCAP 285*    No results found for this or any previous visit (from the past 240 hour(s)).       Radiology Studies: Dg Chest 2 View  Result Date: 10/27/2017 CLINICAL DATA:  84 y/o  F; wheezing and cough. EXAM: CHEST  2 VIEW COMPARISON:  10/26/2017 chest radiograph.  08/20/2017 chest CT. FINDINGS: Stable normal heart size. Aortic valve replacement. Aortic atherosclerosis with calcification. Emphysema. Right greater than left lung base bronchitic changes. Diminished right lung base opacity from prior study, probably atelectasis. No new focal consolidation. No pleural effusion or pneumothorax. Post median sternotomy with wires in alignment. No acute osseous abnormality is evident. Hiatal hernia. IMPRESSION: 1. Right greater than left lung base bronchitic changes. No focal consolidation. 2. Emphysema. 3. Aortic atherosclerosis. 4. Hiatal hernia. Electronically Signed   By: Kristine Garbe M.D.   On: 10/27/2017 02:53   Dg Chest 2 View  Result Date:  10/26/2017 CLINICAL DATA:  Shortness of breath EXAM: CHEST  2 VIEW COMPARISON:  PET CT 10/09/2017 Chest radiograph 03/27/2017 FINDINGS: Hyperinflated lungs with increased biapical lucency consistent with bullous emphysema. Increased opacity in the medial right lung base. The remote median sternotomy and prosthetic the aortic valve. No pleural effusion or pneumothorax. IMPRESSION: 1.  Emphysema (ICD10-J43.9). 2. Increased opacity in the medial right lung base could indicate developing pneumonia. Electronically Signed   By: Ulyses Jarred M.D.   On: 10/26/2017 16:26        Scheduled Meds: . amLODipine  10 mg Oral Daily  . buPROPion  150 mg Oral Daily  . cholecalciferol  1,000 Units Oral QHS  . fluticasone  2 puff Inhalation BID  . insulin aspart  0-9 Units Subcutaneous Q4H  . ipratropium-albuterol  3 mL Nebulization QID  . irbesartan  150 mg Oral Daily  . Magnesium  250 mg Oral QHS  . methylPREDNISolone (SOLU-MEDROL) injection  40 mg Intravenous Q8H  . pantoprazole (PROTONIX) IV  40 mg Intravenous Q12H  . pravastatin  40 mg Oral QPM  . sertraline  100 mg Oral  q morning - 10a  . sodium chloride flush  3 mL Intravenous Q12H  . sodium chloride flush  3 mL Intravenous Q12H   Continuous Infusions: . sodium chloride    . azithromycin Stopped (10/27/17 1015)     LOS: 0 days     Vernell Leep, MD, FACP, [facility]. [facility] Pager [phone]  If 7PM-7AM, please contact night-coverage [email or web address] Password [email or web address] 10/27/2017, 1:15 PM

## 2017-10-27 NOTE — ED Notes (Signed)
Floor RN updated on medication administration done prior to pt moving to floor

## 2017-10-27 NOTE — ED Notes (Signed)
While ambulating with a walking pt O2 sat was 85% on 4L nasal canula.

## 2017-10-27 NOTE — ED Provider Notes (Signed)
[place] EMERGENCY DEPARTMENT Provider Note   CSN: [id number] Arrival date & time: 10/26/17  [place]     History   Chief Complaint Chief Complaint  Patient presents with  . Abnormal Lab    HPI [name] is a 84 y.o. female.  Patient with past medical history remarkable for diabetes, hyperlipidemia, hypertension, and asthma (wears home O2 at 2 L), presents to the emergency department with chief complaint of shortness of breath.  She states that she was sent by her doctor for concerns of symptomatic anemia.  She states that her doctor told her she needed a blood transfusion.  She states that she has felt short of breath for the past week, and feels very short of breath with exertion.  She states that she is unable to walk down the hallway without pausing to catch her breath.  She denies any chest pain.  She reports that she has felt lightheaded, and has fallen several times in the past couple of weeks.  She denies dizziness.  She states that she has had a productive cough.  She has had wheezing, and is taking her home nebulizer with minimal relief.   The history is provided by the patient. No language interpreter was used.    Past Medical History:  Diagnosis Date  . Anxiety   . Aortic stenosis   . Arthritis   . Asthma    uses inhaler as needed  . Depression   . Diabetes mellitus   . Emphysema of lung ([place])   . GERD (gastroesophageal reflux disease)   . Glaucoma   . Hyperlipidemia   . Hypertension   . [name]   . Murmur   . Nephrolithiasis     Patient Active Problem List   Diagnosis Date Noted  . Pulmonary nodule, left 10/15/2017  . Anemia, chronic disease 12/07/2016  . Chronic obstructive pulmonary disease ([place])   . Chronic respiratory failure ([place]) 11/19/2016  . CKD (chronic kidney disease), stage III ([place]) 11/19/2016  . Diabetes mellitus type 2, uncontrolled ([place]) 11/19/2016  . Recurrent pneumonia 11/19/2016  . Acute on  chronic diastolic heart failure ([place]) 11/19/2016  . Pulmonary HTN ([place]) 11/19/2016  . HLD (hyperlipidemia) 11/19/2016  . Hiatal hernia 05/01/2016  . History of bacterial pneumonia 05/01/2016  . COPD exacerbation ([place]) 11/18/2015  . Physical deconditioning 07/24/2013  . S/P AVR (aortic valve replacement) 07/23/2013  . S/P CABG x 1 07/23/2013  . Aortic stenosis 06/10/2013  . Essential hypertension 06/10/2013  . COPD GOLD II  10/26/2011    Past Surgical History:  Procedure Laterality Date  . ABDOMINAL HYSTERECTOMY    . ANKLE SURGERY  1998   d/t fx.  Left ankle  . ANTERIOR AND POSTERIOR REPAIR  06/21/2011   Procedure: ANTERIOR (CYSTOCELE) AND POSTERIOR REPAIR (RECTOCELE);  Surgeon: Maisie Fus;  Location: [facility];  Service: Gynecology;  Laterality: N/A;  . AORTIC VALVE REPLACEMENT N/A 07/17/2013   Procedure: AORTIC VALVE REPLACEMENT (AVR);  Surgeon: Ivin Poot, MD;  Location: [facility];  Service: Open Heart Surgery;  Laterality: N/A;  . CARDIAC CATHETERIZATION    . CARDIAC VALVE REPLACEMENT    . CORONARY ANGIOPLASTY    . CORONARY ARTERY BYPASS GRAFT N/A 07/17/2013   Procedure: CORONARY ARTERY BYPASS GRAFT, TIMES ONE, ON PUMP, USING RIGHT INTERNAL MAMMARY ARTERY.;  Surgeon: Ivin Poot, MD;  Location: [facility];  Service: Open Heart Surgery;  Laterality: N/A;  . FACIAL COSMETIC SURGERY  face lift  . INTRAOPERATIVE TRANSESOPHAGEAL ECHOCARDIOGRAM N/A 07/17/2013   Procedure: INTRAOPERATIVE TRANSESOPHAGEAL ECHOCARDIOGRAM;  Surgeon: Ivin Poot, MD;  Location: [place] [facility];  Service: Open Heart Surgery;  Laterality: N/A;  . LAPAROSCOPIC ASSISTED VAGINAL HYSTERECTOMY  06/21/2011   Procedure: LAPAROSCOPIC ASSISTED VAGINAL HYSTERECTOMY;  Surgeon: Maisie Fus;  Location: [facility];  Service: Gynecology;  Laterality: N/A;  . LEFT AND RIGHT HEART CATHETERIZATION WITH CORONARY ANGIOGRAM N/A 06/27/2013   Procedure: LEFT AND RIGHT HEART CATHETERIZATION WITH CORONARY ANGIOGRAM;  Surgeon: Ramond Dial, MD;  Location: [facility];  Service: Cardiovascular;  Laterality: N/A;  . NOSE SURGERY    . SALPINGOOPHORECTOMY  06/21/2011   Procedure: SALPINGO OOPHERECTOMY;  Surgeon: Maisie Fus;  Location: [facility];  Service: Gynecology;  Laterality: Bilateral;  . VAGINAL PROLAPSE REPAIR  06/21/2011   Procedure: SACROPEXY;  Surgeon: Maisie Fus;  Location: [facility];  Service: Gynecology;  Laterality: N/A;  sacrospinous ligament suspension    OB History    No data available       Home Medications    Prior to Admission medications   Medication Sig Start Date End Date Taking? Authorizing Provider  acetaminophen (TYLENOL) 500 MG tablet Take 500 mg by mouth every 6 (six) hours as needed for headache.    [provider]  albuterol (PROVENTIL) (2.5 MG/3ML) 0.083% nebulizer solution Take 3 mLs (2.5 mg total) by nebulization every 6 (six) hours as needed for wheezing. 11/23/16   Eugenie Filler, MD  amLODipine (NORVASC) 10 MG tablet Take 1 tablet (10 mg total) by mouth daily. 10/19/15   Lelon Perla, MD  aspirin EC 81 MG tablet Take 81 mg by mouth at bedtime.    [provider]  bacitracin ointment Apply 1 application topically 2 (two) times daily. For 10 days 10/16/17   Duffy Bruce, MD  buPROPion (WELLBUTRIN XL) 150 MG 24 hr tablet Take 150 mg by mouth daily. 06/04/17   [provider]  cholecalciferol (VITAMIN D) 1000 units tablet Take 1,000 Units by mouth at bedtime.     [provider]  fluticasone (FLOVENT HFA) 220 MCG/ACT inhaler Inhale 2 puffs into the lungs 2 (two) times daily.    [provider]  furosemide (LASIX) 40 MG tablet Take 1 tablet (40 mg total) by mouth every other day. Patient taking differently: Take 20 mg by mouth every other day.  11/23/16   Eugenie Filler, MD  glimepiride (AMARYL) 1 MG tablet Take 1 mg by mouth daily with breakfast.     [provider]  guaifenesin (ROBITUSSIN) 100 MG/5ML syrup Take 50 mg by mouth  3 (three) times daily as needed for cough. Liquid Mucinex    [provider]  ipratropium-albuterol (DUONEB) 0.5-2.5 (3) MG/3ML SOLN Take 3 mLs by nebulization 4 (four) times daily. Dx: J44.9 01/01/17   Parrett, Fonnie Mu, NP  irbesartan (AVAPRO) 150 MG tablet Take 1 tablet (150 mg total) by mouth daily. 10/12/14   Lelon Perla, MD  Magnesium 250 MG TABS Take 250 mg by mouth at bedtime.     [provider]  Multiple Vitamins-Minerals (OCUVITE-LUTEIN PO) Take 1 tablet by mouth every morning.     [provider]  pravastatin (PRAVACHOL) 40 MG tablet Take 40 mg by mouth every evening.     [provider]  PROAIR HFA 108 (90 BASE) MCG/ACT inhaler Inhale 2 puffs into the lungs 4 (four) times daily as needed for wheezing or shortness of  breath.  02/24/14   [provider]  sertraline (ZOLOFT) 100 MG tablet Take 1 tablet (100 mg total) by mouth every morning. 11/24/16   Eugenie Filler, MD    Family History Family History  Problem Relation Age of Onset  . Emphysema Father   . Colon cancer Neg Hx   . Esophageal cancer Neg Hx   . Rectal cancer Neg Hx   . Stomach cancer Neg Hx     Social History Social History   Tobacco Use  . Smoking status: Former Smoker    Packs/day: 2.00    Years: 15.00    Pack years: 30.00    Types: Cigarettes    Last attempt to quit: 09/18/1972    Years since quitting: 45.1  . Smokeless tobacco: Never Used  Substance Use Topics  . Alcohol use: No    Alcohol/week: 0.0 oz  . Drug use: No     Allergies   Amaryl [glimepiride]; Avelox [moxifloxacin hcl in nacl]; Ciprofloxacin; Lipitor [atorvastatin]; Lisinopril; Other; Prevacid [lansoprazole]; Spiriva handihaler [tiotropium bromide monohydrate]; and Symbicort [budesonide-formoterol fumarate]   Review of Systems Review of Systems  All other systems reviewed and are negative.    Physical Exam Updated Vital Signs BP (!) 171/68   Pulse 73   Temp 98.1 F (36.7 C)  (Oral)   Resp 18   Ht 5' (1.524 m)   Wt 45.4 kg (100 lb)   SpO2 98%   BMI 19.53 kg/m   Physical Exam  Constitutional: She is oriented to person, place, and time. She appears well-developed and well-nourished.  HENT:  Head: Normocephalic and atraumatic.  Eyes: Conjunctivae and EOM are normal. Pupils are equal, round, and reactive to light.  Neck: Normal range of motion. Neck supple.  Cardiovascular: Normal rate and regular rhythm. Exam reveals no gallop and no friction rub.  No murmur heard. Pulmonary/Chest: Effort normal. No respiratory distress. She has wheezes. She has no rales. She exhibits no tenderness.  Diffuse wheezing  Abdominal: Soft. Bowel sounds are normal. She exhibits no distension and no mass. There is no tenderness. There is no rebound and no guarding.  Musculoskeletal: Normal range of motion. She exhibits no edema or tenderness.  Neurological: She is alert and oriented to person, place, and time.  Skin: Skin is warm and dry.  Psychiatric: She has a normal mood and affect. Her behavior is normal. Judgment and thought content normal.  Nursing note and vitals reviewed.    ED Treatments / Results  Labs (all labs ordered are listed, but only abnormal results are displayed) Labs Reviewed  COMPREHENSIVE METABOLIC PANEL - Abnormal; Notable for the following components:      Result Value   CO2 21 (*)    Glucose, Bld 184 (*)    BUN 32 (*)    Creatinine, Ser 1.30 (*)    GFR calc non Af Amer 37 (*)    GFR calc Af Amer 43 (*)    All other components within normal limits  CBC - Abnormal; Notable for the following components:   RBC 3.58 (*)    Hemoglobin 8.1 (*)    HCT 27.5 (*)    MCV 76.8 (*)    MCH 22.6 (*)    MCHC 29.5 (*)    RDW 16.1 (*)    All other components within normal limits  PROTIME-INR  POC OCCULT BLOOD, ED  TYPE AND SCREEN    EKG  EKG Interpretation None       Radiology Dg Chest  2 View  Result Date: 10/26/2017 CLINICAL DATA:  Shortness of  breath EXAM: CHEST  2 VIEW COMPARISON:  PET CT 10/09/2017 Chest radiograph 03/27/2017 FINDINGS: Hyperinflated lungs with increased biapical lucency consistent with bullous emphysema. Increased opacity in the medial right lung base. The remote median sternotomy and prosthetic the aortic valve. No pleural effusion or pneumothorax. IMPRESSION: 1.  Emphysema (ICD10-J43.9). 2. Increased opacity in the medial right lung base could indicate developing pneumonia. Electronically Signed   By: Ulyses Jarred M.D.   On: 10/26/2017 16:26    Procedures Procedures (including critical care time)  Medications Ordered in ED Medications  ipratropium-albuterol (DUONEB) 0.5-2.5 (3) MG/3ML nebulizer solution 5 mL (not administered)     Initial Impression / Assessment and Plan / ED Course  I have reviewed the triage vital signs and the nursing notes.  Pertinent labs & imaging results that were available during my care of the patient were reviewed by me and considered in my medical decision making (see chart for details).    Patient sent to ED by PCP for shortness of breath.  Reportedly concerned about anemia.  Patient's hemoglobin is 8.1, her baseline is around 10.  However, she is wheezing on exam and has pursed lip breathing.  I believe that her COPD exacerbation is because of her shortness of breath.  She will be given Solu-Medrol and albuterol.  She reports that she has an intolerance to ipratropium.  Chest x-ray shows bronchitic changes.  BNP is mildly elevated at 175.  She has no lower extremity edema or overt fluid overload.  Patient seen by discussed with Dr. Leonides Schanz, who agrees the plan for admission for COPD exacerbation.  Patient ambulates after breathing treatments and her O2 saturation drops to 85% on 4 L.  I discussed case with Dr. Myna Hidalgo, who is appreciated for admitting the patient.  Final Clinical Impressions(s) / ED Diagnoses   Final diagnoses:  Hypoxia  Anemia, unspecified type  COPD  exacerbation [facility])    ED Discharge Orders    None       Montine Circle, PA-C 10/27/17 [id number]

## 2017-10-27 NOTE — Progress Notes (Signed)
Pt takes Lasix 20mg  every other day at home,  pt may need to take lasix during this admission.

## 2017-10-27 NOTE — ED Notes (Signed)
Breakfast tray ordered 

## 2017-10-27 NOTE — ED Notes (Signed)
Lunch tray ordered; carb modified

## 2017-10-27 NOTE — H&P (Signed)
History and Physical    [name] FTD:[id number] DOB: 24-Mar-1932 DOA: 10/27/2017  PCP: Harlan Stains, MD   Patient coming from: Home  Chief Complaint: SOB, wheezing, low Hgb on outpatient labs   HPI: [name] is a 84 y.o. female with medical history significant for COPD with chronic hypoxic respiratory failure, depression, type 2 diabetes mellitus, hypertension, and coronary artery disease status post CABG, now presenting to the emergency department for evaluation of increased shortness of breath, cough, despite currently being on prednisone taper.  She was also noted to have a decrease in hemoglobin on outpatient blood work.  Patient reports that she had noted increased dyspnea, cough, and sputum production several days ago, saw her PCP for these complaints 4 days ago, was started on a prednisone taper and prescribed doxycycline that she had not yet started.  She was sent for blood work and was notified today by her PCP that hemoglobin returned lower than priors.  She denies any melena or hematochezia and ingestion.  Reports her prior colonoscopies have been normal and she denies ever undergoing upper endoscopy.  She reports a history of hemorrhoids.  ED Course: Upon arrival to the ED, patient is found to be afebrile, saturating 87% on her usual 2 L/min of supplemental oxygen, tachypneic, slightly tachycardic, and mildly hypertensive.  Chest x-ray is notable for emphysematous changes and right greater than left bronchitic changes.  Chemistry panel is notable for a creatinine of 1.30, consistent with her apparent baseline.  CBC features a hemoglobin of 8.1, down from 10.7 last June.  MCV is 76.8, previously normal.  Fecal occult blood testing is positive.  Patient was treated with 125 mg of IV Solu-Medrol, increased supplemental oxygen, and continuous albuterol neb.  She continues to be dyspneic at rest and wheezing, and she will be admitted to the telemetry unit for ongoing evaluation and  management of acute exacerbation in COPD and worsening anemia with occult GI bleeding.  Review of Systems:  All other systems reviewed and apart from HPI, are negative.  Past Medical History:  Diagnosis Date  . Anxiety   . Aortic stenosis   . Arthritis   . Asthma    uses inhaler as needed  . Depression   . Diabetes mellitus   . Emphysema of lung ([place])   . GERD (gastroesophageal reflux disease)   . Glaucoma   . Hyperlipidemia   . Hypertension   . [name]   . Murmur   . Nephrolithiasis     Past Surgical History:  Procedure Laterality Date  . ABDOMINAL HYSTERECTOMY    . ANKLE SURGERY  1998   d/t fx.  Left ankle  . ANTERIOR AND POSTERIOR REPAIR  06/21/2011   Procedure: ANTERIOR (CYSTOCELE) AND POSTERIOR REPAIR (RECTOCELE);  Surgeon: Maisie Fus;  Location: [facility];  Service: Gynecology;  Laterality: N/A;  . AORTIC VALVE REPLACEMENT N/A 07/17/2013   Procedure: AORTIC VALVE REPLACEMENT (AVR);  Surgeon: Ivin Poot, MD;  Location: [facility];  Service: Open Heart Surgery;  Laterality: N/A;  . CARDIAC CATHETERIZATION    . CARDIAC VALVE REPLACEMENT    . CORONARY ANGIOPLASTY    . CORONARY ARTERY BYPASS GRAFT N/A 07/17/2013   Procedure: CORONARY ARTERY BYPASS GRAFT, TIMES ONE, ON PUMP, USING RIGHT INTERNAL MAMMARY ARTERY.;  Surgeon: Ivin Poot, MD;  Location: [facility];  Service: Open Heart Surgery;  Laterality: N/A;  . FACIAL COSMETIC SURGERY     face lift  . INTRAOPERATIVE TRANSESOPHAGEAL ECHOCARDIOGRAM  N/A 07/17/2013   Procedure: INTRAOPERATIVE TRANSESOPHAGEAL ECHOCARDIOGRAM;  Surgeon: Ivin Poot, MD;  Location: [facility];  Service: Open Heart Surgery;  Laterality: N/A;  . LAPAROSCOPIC ASSISTED VAGINAL HYSTERECTOMY  06/21/2011   Procedure: LAPAROSCOPIC ASSISTED VAGINAL HYSTERECTOMY;  Surgeon: Maisie Fus;  Location: [facility];  Service: Gynecology;  Laterality: N/A;  . LEFT AND RIGHT HEART CATHETERIZATION WITH CORONARY ANGIOGRAM N/A 06/27/2013   Procedure:  LEFT AND RIGHT HEART CATHETERIZATION WITH CORONARY ANGIOGRAM;  Surgeon: Ramond Dial, MD;  Location: [facility] C[facility];  Service: Cardiovascular;  Laterality: N/A;  . NOSE SURGERY    . SALPINGOOPHORECTOMY  06/21/2011   Procedure: SALPINGO OOPHERECTOMY;  Surgeon: Maisie Fus;  Location: [facility];  Service: Gynecology;  Laterality: Bilateral;  . VAGINAL PROLAPSE REPAIR  06/21/2011   Procedure: SACROPEXY;  Surgeon: Maisie Fus;  Location: [facility];  Service: Gynecology;  Laterality: N/A;  sacrospinous ligament suspension     reports that she quit smoking about 45 years ago. Her smoking use included cigarettes. She has a 30.00 pack-year smoking history. she has never used smokeless tobacco. She reports that she does not drink alcohol or use drugs.  Allergies  Allergen Reactions  . Amaryl [Glimepiride]     Side effects  . Avelox [Moxifloxacin Hcl In Nacl]     nausea  . Ciprofloxacin     nausea  . Lipitor [Atorvastatin] Other (See Comments)    myalgias myalgias  . Lisinopril Other (See Comments)    cough cough  . Other Other (See Comments)    Increased cough  . Prevacid [Lansoprazole]     diarrhea  . Spiriva Handihaler [Tiotropium Bromide Monohydrate]     Increased cough  . Symbicort [Budesonide-Formoterol Fumarate]     Increased cough    Family History  Problem Relation Age of Onset  . Emphysema Father   . Colon cancer Neg Hx   . Esophageal cancer Neg Hx   . Rectal cancer Neg Hx   . Stomach cancer Neg Hx      Prior to Admission medications   Medication Sig Start Date End Date Taking? Authorizing Provider  acetaminophen (TYLENOL) 500 MG tablet Take 500 mg by mouth every 6 (six) hours as needed for headache.   Yes [provider]  albuterol (PROVENTIL) (2.5 MG/3ML) 0.083% nebulizer solution Take 3 mLs (2.5 mg total) by nebulization every 6 (six) hours as needed for wheezing. 11/23/16  Yes Eugenie Filler, MD  amLODipine (NORVASC) 10 MG tablet Take 1 tablet (10 mg  total) by mouth daily. 10/19/15  Yes Lelon Perla, MD  aspirin EC 81 MG tablet Take 81 mg by mouth at bedtime.   Yes [provider]  bacitracin ointment Apply 1 application topically 2 (two) times daily. For 10 days 10/16/17  Yes Duffy Bruce, MD  benzonatate (TESSALON) 100 MG capsule Take 200 mg by mouth 3 (three) times daily as needed for cough.  10/24/17  Yes [provider]  buPROPion (WELLBUTRIN XL) 150 MG 24 hr tablet Take 150 mg by mouth daily. 06/04/17  Yes [provider]  cholecalciferol (VITAMIN D) 1000 units tablet Take 1,000 Units by mouth at bedtime.    Yes [provider]  fluticasone (FLOVENT HFA) 220 MCG/ACT inhaler Inhale 2 puffs into the lungs 2 (two) times daily.   Yes [provider]  furosemide (LASIX) 40 MG tablet Take 1 tablet (40 mg total) by mouth every other day. Patient taking differently: Take 20 mg by mouth  every other day.  11/23/16  Yes Eugenie Filler, MD  glimepiride (AMARYL) 1 MG tablet Take 1 mg by mouth daily with breakfast.    Yes [provider]  guaifenesin (ROBITUSSIN) 100 MG/5ML syrup Take 50 mg by mouth 3 (three) times daily as needed for cough. Liquid Mucinex   Yes [provider]  ipratropium-albuterol (DUONEB) 0.5-2.5 (3) MG/3ML SOLN Take 3 mLs by nebulization 4 (four) times daily. Dx: J44.9 01/01/17  Yes Parrett, Tammy S, NP  irbesartan (AVAPRO) 150 MG tablet Take 1 tablet (150 mg total) by mouth daily. 10/12/14  Yes Lelon Perla, MD  Magnesium 250 MG TABS Take 250 mg by mouth at bedtime.    Yes [provider]  Multiple Vitamins-Minerals (OCUVITE-LUTEIN PO) Take 1 tablet by mouth every morning.    Yes [provider]  pravastatin (PRAVACHOL) 40 MG tablet Take 40 mg by mouth every evening.    Yes [provider]  predniSONE (DELTASONE) 20 MG tablet Take 40 mg by mouth daily. 10/24/17  Yes [provider]  PROAIR HFA 108 (90 BASE) MCG/ACT inhaler  Inhale 2 puffs into the lungs 4 (four) times daily as needed for wheezing or shortness of breath.  02/24/14  Yes [provider]  sertraline (ZOLOFT) 100 MG tablet Take 1 tablet (100 mg total) by mouth every morning. 11/24/16  Yes Eugenie Filler, MD    Physical Exam: Vitals:   10/27/17 0320 10/27/17 0330 10/27/17 0430 10/27/17 0445  BP:  (!) 128/101 (!) 151/119 (!) 177/68  Pulse:  70 (!) 102 87  Resp:  19 (!) 24 (!) 26  Temp:      TempSrc:      SpO2: 100% 100% 96% (!) 87%  Weight:      Height:          Constitutional: NAD, calm, frail Eyes: PERTLA, lids and conjunctivae normal ENMT: Mucous membranes are moist. Posterior pharynx clear of any exudate or lesions.   Neck: normal, supple, no masses, no thyromegaly Respiratory: Breath sounds diminished bilaterally with prolonged expiratory phase and diffuse wheezes. No accessory muscle use.  Cardiovascular: Rate ~110 and regular. No significant JVD. Abdomen: No distension, no tenderness, no masses palpated. Bowel sounds normal.  Musculoskeletal: no clubbing / cyanosis. No joint deformity upper and lower extremities.    Skin: no significant rashes, lesions, ulcers. Warm, dry, well-perfused. Neurologic: CN 2-12 grossly intact. Sensation intact. Strength 5/5 in all 4 limbs.  Psychiatric:  Alert and oriented x 3. Pleasant and cooperative.     Labs on Admission: I have personally reviewed following labs and imaging studies  CBC: Recent Labs  Lab 10/26/17 1937  WBC 10.3  HGB 8.1*  HCT 27.5*  MCV 76.8*  PLT 962   Basic Metabolic Panel: Recent Labs  Lab 10/26/17 1937  NA 138  K 3.9  CL 105  CO2 21*  GLUCOSE 184*  BUN 32*  CREATININE 1.30*  CALCIUM 9.0   GFR: Estimated Creatinine Clearance: 23.5 mL/min (A) (by C-G formula based on SCr of 1.3 mg/dL (H)). Liver Function Tests: Recent Labs  Lab 10/26/17 1937  AST 36  ALT 41  ALKPHOS 64  BILITOT 0.5  PROT 7.2  ALBUMIN 3.9   No results for input(s): LIPASE,  AMYLASE in the last 168 hours. No results for input(s): AMMONIA in the last 168 hours. Coagulation Profile: Recent Labs  Lab 10/26/17 1937  INR 1.07   Cardiac Enzymes: No results for input(s): CKTOTAL, CKMB, CKMBINDEX, TROPONINI in  the last 168 hours. BNP (last 3 results) No results for input(s): PROBNP in the last 8760 hours. HbA1C: No results for input(s): HGBA1C in the last 72 hours. CBG: No results for input(s): GLUCAP in the last 168 hours. Lipid Profile: No results for input(s): CHOL, HDL, LDLCALC, TRIG, CHOLHDL, LDLDIRECT in the last 72 hours. Thyroid Function Tests: No results for input(s): TSH, T4TOTAL, FREET4, T3FREE, THYROIDAB in the last 72 hours. Anemia Panel: No results for input(s): VITAMINB12, FOLATE, FERRITIN, TIBC, IRON, RETICCTPCT in the last 72 hours. Urine analysis:    Component Value Date/Time   COLORURINE YELLOW 03/02/2017 2156   APPEARANCEUR HAZY (A) 03/02/2017 2156   LABSPEC 1.014 03/02/2017 2156   PHURINE 5.0 03/02/2017 2156   GLUCOSEU NEGATIVE 03/02/2017 2156   HGBUR NEGATIVE 03/02/2017 2156   BILIRUBINUR NEGATIVE 03/02/2017 2156   KETONESUR NEGATIVE 03/02/2017 2156   PROTEINUR 30 (A) 03/02/2017 2156   UROBILINOGEN 0.2 07/11/2013 1416   NITRITE NEGATIVE 03/02/2017 2156   LEUKOCYTESUR SMALL (A) 03/02/2017 2156   Sepsis Labs: @LABRCNTIP (procalcitonin:4,lacticidven:4) )No results found for this or any previous visit (from the past 240 hour(s)).   Radiological Exams on Admission: Dg Chest 2 View  Result Date: 10/27/2017 CLINICAL DATA:  84 y/o  F; wheezing and cough. EXAM: CHEST  2 VIEW COMPARISON:  10/26/2017 chest radiograph.  08/20/2017 chest CT. FINDINGS: Stable normal heart size. Aortic valve replacement. Aortic atherosclerosis with calcification. Emphysema. Right greater than left lung base bronchitic changes. Diminished right lung base opacity from prior study, probably atelectasis. No new focal consolidation. No pleural effusion or  pneumothorax. Post median sternotomy with wires in alignment. No acute osseous abnormality is evident. Hiatal hernia. IMPRESSION: 1. Right greater than left lung base bronchitic changes. No focal consolidation. 2. Emphysema. 3. Aortic atherosclerosis. 4. Hiatal hernia. Electronically Signed   By: Kristine Garbe M.D.   On: 10/27/2017 02:53   Dg Chest 2 View  Result Date: 10/26/2017 CLINICAL DATA:  Shortness of breath EXAM: CHEST  2 VIEW COMPARISON:  PET CT 10/09/2017 Chest radiograph 03/27/2017 FINDINGS: Hyperinflated lungs with increased biapical lucency consistent with bullous emphysema. Increased opacity in the medial right lung base. The remote median sternotomy and prosthetic the aortic valve. No pleural effusion or pneumothorax. IMPRESSION: 1.  Emphysema (ICD10-J43.9). 2. Increased opacity in the medial right lung base could indicate developing pneumonia. Electronically Signed   By: Ulyses Jarred M.D.   On: 10/26/2017 16:26    EKG: Not performed.    Assessment/Plan  1. COPD with acute exacerbation; acute on chronic hypoxic respiratory failure  - Presents with increased SOB, wheezing, and cough despite current prednisone taper   - She is requiring increased FiO2 while at rest in ED  - No fever, CXR without focal consolidation  - Treated in ED with nebs and 125 mg IV Solu-Medrol  - Check sputum culture, continue systemic steroid, continue ICS/LABA and scheduled DuoNeb, continue prn albuterol, start azithromycin    2. Microcytic anemia; GI blood loss - Hgb is 8.1 on admission, down from 10.7 in March 2018  - Patient denies melena, hematochezia, abdominal pain, or indigestion  - FOBT is positive  - Type and screen performed  - Likely slow bleed  - Follow serial H&H - Start IV Protonix BID   3. Type II DM  - A1c was 8.0% in March 2018  - Managed at home with glimepiride  - Follow CBG's and use SSI with Novolog    4. Depression - Stable  - Continue Zoloft  and Wellbutrin     5. Hypertension  - BP elevated in ED  - Continue Norvasc and irbesartan    6. CAD - No anginal complaints  - Hold ASA in light of GIB, continue ARB and statin   7. CKD stage III - SCr is 1.30 on admission, consistent with her apparent baseline  - Renally-dose medications and avoid nephrotoxins     DVT prophylaxis: SCD's  Code Status: Full  Family Communication: Discussed with patient Disposition Plan: Admit to telemetry Consults called: None Admission status: Inpatient    Vianne Bulls, MD [facility] Pager [phone]  If 7PM-7AM, please contact night-coverage [email or web address] Password [facility]  10/27/2017, 5:53 AM

## 2017-10-27 NOTE — ED Provider Notes (Signed)
Medical screening examination/treatment/procedure(s) were conducted as a shared visit with non-physician practitioner(s) and myself.  I personally evaluated the patient during the encounter.   EKG Interpretation  Date/Time:  Saturday October 27 2017 06:06:50 EST Ventricular Rate:  87 PR Interval:    QRS Duration: 132 QT Interval:  399 QTC Calculation: 480 R Axis:   -90 Text Interpretation:  Sinus arrhythmia RBBB and LAFB ST elevation, consider inferior injury No significant change since last tracing Confirmed by Pryor Curia [phone]) on 10/27/2017 6:09:41 AM      Patient is an 84 year old female who presents to the emergency department shortness of breath.  She has been coughing and wheezing and has history of COPD and is on oxygen.  Was told to come here by her primary care physician for hemoglobin of 8.1.  No rectal bleeding or melena.  Mildly guaiac positive here.  No sign of hemorrhage.  I do not think that her hemoglobin is causing her to be short of breath.  Think this is a COPD exacerbation.  She does not need emergent transfusion.  She is not hemorrhaging.  Will give breathing treatments, steroids.  Patient to be admitted for COPD exacerbation.  No pneumonia seen on x-ray or sign of volume.  No chest pain or chest discomfort.  Troponin negative.  Admitted to medicine.   Raeven Pint, Delice Bison, DO 10/27/17 [phone]

## 2017-10-28 DIAGNOSIS — J9621 Acute and chronic respiratory failure with hypoxia: Secondary | ICD-10-CM

## 2017-10-28 DIAGNOSIS — E1165 Type 2 diabetes mellitus with hyperglycemia: Secondary | ICD-10-CM

## 2017-10-28 DIAGNOSIS — D500 Iron deficiency anemia secondary to blood loss (chronic): Secondary | ICD-10-CM

## 2017-10-28 DIAGNOSIS — R195 Other fecal abnormalities: Secondary | ICD-10-CM

## 2017-10-28 LAB — CBC
HCT: 25.8 % — ABNORMAL LOW (ref 36.0–46.0)
Hemoglobin: 7.6 g/dL — ABNORMAL LOW (ref 12.0–15.0)
MCH: 22.6 pg — AB (ref 26.0–34.0)
MCHC: 29.5 g/dL — AB (ref 30.0–36.0)
MCV: 76.6 fL — ABNORMAL LOW (ref 78.0–100.0)
Platelets: 268 10*3/uL (ref 150–400)
RBC: 3.37 MIL/uL — ABNORMAL LOW (ref 3.87–5.11)
RDW: 16.2 % — AB (ref 11.5–15.5)
WBC: 9 10*3/uL (ref 4.0–10.5)

## 2017-10-28 LAB — GLUCOSE, CAPILLARY
Glucose-Capillary: 166 mg/dL — ABNORMAL HIGH (ref 65–99)
Glucose-Capillary: 167 mg/dL — ABNORMAL HIGH (ref 65–99)
Glucose-Capillary: 226 mg/dL — ABNORMAL HIGH (ref 65–99)
Glucose-Capillary: 140 mg/dL — ABNORMAL HIGH (ref 65–99)

## 2017-10-28 MED ORDER — ASPIRIN EC 81 MG PO TBEC
81.0000 mg | DELAYED_RELEASE_TABLET | Freq: Every day | ORAL | Status: DC
  Administered 2017-10-28: 81 mg via ORAL
  Filled 2017-10-28: qty 1

## 2017-10-28 MED ORDER — MENTHOL 3 MG MT LOZG
1.0000 | LOZENGE | OROMUCOSAL | Status: DC | PRN
  Administered 2017-10-28: 3 mg via ORAL
  Filled 2017-10-28: qty 9

## 2017-10-28 MED ORDER — PANTOPRAZOLE SODIUM 40 MG PO TBEC
40.0000 mg | DELAYED_RELEASE_TABLET | Freq: Two times a day (BID) | ORAL | Status: DC
  Administered 2017-10-28 – 2017-10-29 (×3): 40 mg via ORAL
  Filled 2017-10-28 (×3): qty 1

## 2017-10-28 MED ORDER — METHYLPREDNISOLONE SODIUM SUCC 40 MG IJ SOLR
40.0000 mg | Freq: Two times a day (BID) | INTRAMUSCULAR | Status: DC
  Administered 2017-10-28 – 2017-10-29 (×2): 40 mg via INTRAVENOUS
  Filled 2017-10-28 (×2): qty 1

## 2017-10-28 MED ORDER — FUROSEMIDE 20 MG PO TABS
20.0000 mg | ORAL_TABLET | ORAL | Status: DC
  Administered 2017-10-28: 20 mg via ORAL
  Filled 2017-10-28 (×2): qty 1

## 2017-10-28 MED ORDER — PANTOPRAZOLE SODIUM 40 MG PO TBEC: 40.0000 mg | DELAYED_RELEASE_TABLET | Freq: Every day | ORAL | Status: DC

## 2017-10-28 MED ORDER — AZITHROMYCIN 500 MG PO TABS
500.0000 mg | ORAL_TABLET | Freq: Every day | ORAL | Status: DC
  Administered 2017-10-29: 500 mg via ORAL
  Filled 2017-10-28: qty 1

## 2017-10-28 NOTE — Progress Notes (Addendum)
PROGRESS NOTE   [name]  FOY:[id number]    DOB: [birth year]    DOA: 10/27/2017  PCP: Harlan Stains, MD   I have briefly reviewed patients previous medical records in [facility].  Brief Narrative:  84 year old female with PMH of COPD, chronic hypoxic respiratory failure on home oxygen 2 L/min, DM 2, HTN, HLD, CAD, CABG, aortic stenosis status post bioprosthetic AVR presented to ED with 3-4 weeks history of progressive generalized weakness and few days history of cough, dyspnea that failed outpatient treatment with prednisone taper.  Lab work by PCP showed hemoglobin 8.1, lower than prior and advised to come to the hospital.  Admitted for COPD exacerbation and microcytic anemia, possibly symptomatic.   Assessment & Plan:   Principal Problem:   COPD exacerbation ([place]) Active Problems:   Acute on chronic respiratory failure with hypoxia (HCC)   CKD (chronic kidney disease), stage III (HCC)   Diabetes mellitus type 2, uncontrolled (HCC)   Chronic diastolic CHF (congestive heart failure) (HCC)   CAD (coronary artery disease)   Microcytic anemia   Anemia due to GI blood loss   COPD with acute exacerbation ([place])   1. COPD exacerbation: Possibly precipitated by acute viral bronchitis.  Treating with azithromycin, bronchodilator nebulizations, IV Solu-Medrol.  Added flutter valve.  Improving.  Reduce and continue IV Solu-Medrol for additional day prior to transitioning to oral prednisone taper. 2. Acute on chronic hypoxic respiratory failure: More than prior home oxygen needs.  Secondary to problem #1 complicating anemia.  Treatment as above and titrate oxygen to prior home levels.  Improving.  Back on home oxygen 2 L/min via nasal cannula. 3. Microcytic (iron deficiency) anemia/FOBT +: Hemoglobin 8.1 on admission, down from 10.7 in March 2018.  Denies overt GI bleed.  FOBT +.  Last colonoscopy by [facility] in 2013 showed diverticulosis.?  Related to slow GI bleed and chronic kidney  disease.  Anemia panel reviewed.  Suggestive of iron deficiency.  Hemoglobin has dropped to 7.6.  [facility] consulted for further evaluation.  Consider starting iron supplements pending GI input. 4. Type II DM: A1c: 6.9.  Hold oral hypoglycemics.  Low-dose Lantus, SSI.  Adjust insulins as needed.  Mildly uncontrolled but acceptable. 5. Essential hypertension: Mildly uncontrolled.  Continue amlodipine and irbesartan. 6. Anxiety and depression: Continue Zoloft and Wellbutrin.  Stable. 7. CAD status post CABG: No chest pain reported.  Continue aspirin. 8. Status post bioprosthetic AVR: Continue aspirin.   9. Stage III chronic kidney disease: Creatinine likely at baseline.  Follow BMP periodically, check in am. Has NAG metabolic acidosis.     DVT prophylaxis: SCDs Code Status: Full Family Communication: None at bedside Disposition: DC home pending clinical improvement.   Consultants:  [facility]-pending  Procedures:  None  Antimicrobials:  Azithromycin   Subjective: Reports that she feels much better.  Able to sleep well last night which she was not able to do the night prior.  Dyspnea significantly improved but not yet at baseline.  No BM since hospital admission.  No chest pain.  States that she ambulated in the room without dizziness or lightheadedness.  ROS: Denies nausea, vomiting, melena or rectal bleeding.  Appetite is stable.  Weight stable.  Objective:  Vitals:   10/28/17 0442 10/28/17 0451 10/28/17 0600 10/28/17 0744  BP:  (!) 176/67 (!) 160/61 (!) 143/60  Pulse:  76 67 68  Resp:  (!) 23  20  Temp:  (!) 97.5 F (36.4 C)  97.6 F (36.4  C)  TempSrc:  Oral  Oral  SpO2:  97%  97%  Weight: 46.6 kg (102 lb 11.2 oz)     Height:        Examination:  General exam: Elderly female, small built, frail, sitting up comfortably in bed without distress.  Eating breakfast this morning. Respiratory system: Improved breath sounds.  Scattered occasional rhonchi bilaterally.  No  increased work of breathing. Cardiovascular system: S1 & S2 heard, RRR. No JVD, rubs, gallops or clicks. No pedal edema.  Grade 2 x 6 systolic ejection murmur best heard at apex.  Stable without change. Gastrointestinal system: Abdomen is nondistended, soft and nontender. No organomegaly or masses felt. Normal bowel sounds heard.  Stable without change. Central nervous system: Alert and oriented. No focal neurological deficits.  Stable without change. Extremities: Symmetric 5 x 5 power. Skin: No rashes, lesions or ulcers Psychiatry: Judgement and insight appear normal. Mood & affect appropriate.  Stable without change.    Data Reviewed: I have personally reviewed following labs and imaging studies  CBC: Recent Labs  Lab 10/26/17 1937 10/27/17 0620 10/28/17 0611  WBC 10.3  --  9.0  HGB 8.1* 7.9* 7.6*  HCT 27.5* 26.4* 25.8*  MCV 76.8*  --  76.6*  PLT 260  --  474   Basic Metabolic Panel: Recent Labs  Lab 10/26/17 1937 10/27/17 0620  NA 138 138  K 3.9 4.1  CL 105 106  CO2 21* 17*  GLUCOSE 184* 280*  BUN 32* 34*  CREATININE 1.30* 1.29*  CALCIUM 9.0 8.6*   Liver Function Tests: Recent Labs  Lab 10/26/17 1937  AST 36  ALT 41  ALKPHOS 64  BILITOT 0.5  PROT 7.2  ALBUMIN 3.9   Coagulation Profile: Recent Labs  Lab 10/26/17 1937  INR 1.07   HbA1C: Recent Labs    10/27/17 1647  HGBA1C 6.9*   CBG: Recent Labs  Lab 10/27/17 0742 10/27/17 1319 10/27/17 1710 10/27/17 2212 10/28/17 0721  GLUCAP 285* 288* 119* 200* 166*    No results found for this or any previous visit (from the past 240 hour(s)).       Radiology Studies: Dg Chest 2 View  Result Date: 10/27/2017 CLINICAL DATA:  84 y/o  F; wheezing and cough. EXAM: CHEST  2 VIEW COMPARISON:  10/26/2017 chest radiograph.  08/20/2017 chest CT. FINDINGS: Stable normal heart size. Aortic valve replacement. Aortic atherosclerosis with calcification. Emphysema. Right greater than left lung base bronchitic  changes. Diminished right lung base opacity from prior study, probably atelectasis. No new focal consolidation. No pleural effusion or pneumothorax. Post median sternotomy with wires in alignment. No acute osseous abnormality is evident. Hiatal hernia. IMPRESSION: 1. Right greater than left lung base bronchitic changes. No focal consolidation. 2. Emphysema. 3. Aortic atherosclerosis. 4. Hiatal hernia. Electronically Signed   By: Kristine Garbe M.D.   On: 10/27/2017 02:53   Dg Chest 2 View  Result Date: 10/26/2017 CLINICAL DATA:  Shortness of breath EXAM: CHEST  2 VIEW COMPARISON:  PET CT 10/09/2017 Chest radiograph 03/27/2017 FINDINGS: Hyperinflated lungs with increased biapical lucency consistent with bullous emphysema. Increased opacity in the medial right lung base. The remote median sternotomy and prosthetic the aortic valve. No pleural effusion or pneumothorax. IMPRESSION: 1.  Emphysema (ICD10-J43.9). 2. Increased opacity in the medial right lung base could indicate developing pneumonia. Electronically Signed   By: Ulyses Jarred M.D.   On: 10/26/2017 16:26        Scheduled Meds: . amLODipine  10  mg Oral Daily  . budesonide (PULMICORT) nebulizer solution  0.25 mg Nebulization BID  . buPROPion  150 mg Oral Daily  . cholecalciferol  1,000 Units Oral QHS  . insulin aspart  0-5 Units Subcutaneous QHS  . insulin aspart  0-9 Units Subcutaneous TID WC  . insulin glargine  10 Units Subcutaneous Q24H  . ipratropium-albuterol  3 mL Nebulization QID  . irbesartan  150 mg Oral Daily  . magnesium oxide  400 mg Oral Daily  . mouth rinse  15 mL Mouth Rinse BID  . methylPREDNISolone (SOLU-MEDROL) injection  40 mg Intravenous Q8H  . pantoprazole (PROTONIX) IV  40 mg Intravenous Q12H  . pravastatin  40 mg Oral QPM  . sertraline  100 mg Oral q morning - 10a  . sodium chloride flush  3 mL Intravenous Q12H  . sodium chloride flush  3 mL Intravenous Q12H   Continuous Infusions: . sodium  chloride    . azithromycin Stopped (10/28/17 0745)     LOS: 1 day     Vernell Leep, MD, FACP, [facility]. [facility] Pager [phone]  If 7PM-7AM, please contact night-coverage [email or web address] Password [facility] 10/28/2017, 8:48 AM

## 2017-10-28 NOTE — Progress Notes (Signed)
Pt will need PT/OT while in the hospital, pt was receiving  outpatient PT through the [facility] system, thanks, Arvella Nigh RN.

## 2017-10-28 NOTE — Consult Note (Signed)
[name]: 9:01 AM 10/28/2017  LOS: 1 day    Referring Provider: Dr Algis Liming  Primary Care Physician:  Harlan Stains, MD Primary Gastroenterologist:  Dr. Fuller Plan    Reason for Consultation:  Anemia.     HPI: [name] is a 84 y.o. female.  Hx CAD, Ao stenosis.  S/p angioplasty. S/p 2014 AVR (Edwards tissue valve) and 1 V CABG.  COPD.  Chronic resp failure, on home O2.  CKD stage  3.    Anemia.  S/p laparoscopic hysterectomy and repair vaginal prolapse    Hx adenomatous and hyperplastic polyps.   2013 Colonoscopy: surveillance study, adenomas in 1992.  Descending and sigmoid tics only.  No need for contd surveillance given pt's age.   No previous EGD  3-4 weeks progressive weakness.  Few days of SOB and cough, sxs persisted despite prednisone taper.  Admitted for COPD flare but Hgb noted low at 8.1, drifted to 7.6 today.  MCV 76.    Hgb  10.2 to 11.3 in 11/2016, 10.7 on  03/02/17. Drifting MCV from 84 to 79 during same interval.  coags normal.   Low iron and iron sat, Ferritin 45.  B12, Folate normal.   FOBT +.   She reports having a single episode of wiping blood per rectum and feeling like she had a hemorrhoid about 2 months ago.  This has not recurred. Home meds include 81 ASA, no NSAIDs, no PPI/H2B.  Now getting Protonix 40 mg daily.  Started on Iron several months ago, took it for ~ 1 month but stopped as it led to constipation.  Prior issues with heartburn and took Prevacid but now she avoids chocolate in the afternoon/evening and has no GER sxs, so no longer uses PPI.   No previous transfusions.  No issues with excessive bleeding/bruising.  No EtOH consumption.  Good appetite.  Daily brown stools.  10/16/17 patient had an accident where she lost her balance and fell off a stool at a restaurant,  ended up lacerating her scalp and required staples.  Initially she had a lot of bleeding from this scalp injury but it resolved quickly once the staples were in place.  Staples were removed yesterday.  Discussed the possibility that the doctor may want to pursue upper endoscopy when her lungs are better, patient is reluctant, somewhat fearful of proceeding with an EGD.       Past Medical History:  Diagnosis Date  . Anxiety   . Aortic stenosis   . Arthritis   . Asthma    uses inhaler as needed  . Depression   . Diabetes mellitus   . Emphysema of lung ([place])   . GERD (gastroesophageal reflux disease)   . Glaucoma   . Hyperlipidemia   . Hypertension   . [name]   . Murmur   . Nephrolithiasis     Past Surgical History:  Procedure Laterality Date  . ABDOMINAL HYSTERECTOMY    . ANKLE SURGERY  1998   d/t fx.  Left ankle  . ANTERIOR AND POSTERIOR  REPAIR  06/21/2011   Procedure: ANTERIOR (CYSTOCELE) AND POSTERIOR REPAIR (RECTOCELE);  Surgeon: Maisie Fus;  Location: [facility];  Service: Gynecology;  Laterality: N/A;  . AORTIC VALVE REPLACEMENT N/A 07/17/2013   Procedure: AORTIC VALVE REPLACEMENT (AVR);  Surgeon: Ivin Poot, MD;  Location: [facility];  Service: Open Heart Surgery;  Laterality: N/A;  . CARDIAC CATHETERIZATION    . CARDIAC VALVE REPLACEMENT    . CORONARY ANGIOPLASTY    . CORONARY ARTERY BYPASS GRAFT N/A 07/17/2013   Procedure: CORONARY ARTERY BYPASS GRAFT, TIMES ONE, ON PUMP, USING RIGHT INTERNAL MAMMARY ARTERY.;  Surgeon: Ivin Poot, MD;  Location: [facility];  Service: Open Heart Surgery;  Laterality: N/A;  . FACIAL COSMETIC SURGERY     face lift  . INTRAOPERATIVE TRANSESOPHAGEAL ECHOCARDIOGRAM N/A 07/17/2013   Procedure: INTRAOPERATIVE TRANSESOPHAGEAL ECHOCARDIOGRAM;  Surgeon: Ivin Poot, MD;  Location: [facility];  Service: Open Heart Surgery;  Laterality: N/A;  . LAPAROSCOPIC ASSISTED VAGINAL HYSTERECTOMY  06/21/2011   Procedure: LAPAROSCOPIC  ASSISTED VAGINAL HYSTERECTOMY;  Surgeon: Maisie Fus;  Location: [facility];  Service: Gynecology;  Laterality: N/A;  . LEFT AND RIGHT HEART CATHETERIZATION WITH CORONARY ANGIOGRAM N/A 06/27/2013   Procedure: LEFT AND RIGHT HEART CATHETERIZATION WITH CORONARY ANGIOGRAM;  Surgeon: Ramond Dial, MD;  Location: [facility];  Service: Cardiovascular;  Laterality: N/A;  . NOSE SURGERY    . SALPINGOOPHORECTOMY  06/21/2011   Procedure: SALPINGO OOPHERECTOMY;  Surgeon: Maisie Fus;  Location: [facility];  Service: Gynecology;  Laterality: Bilateral;  . VAGINAL PROLAPSE REPAIR  06/21/2011   Procedure: SACROPEXY;  Surgeon: Maisie Fus;  Location: [facility];  Service: Gynecology;  Laterality: N/A;  sacrospinous ligament suspension    Prior to Admission medications   Medication Sig Start Date End Date Taking? Authorizing Provider  acetaminophen (TYLENOL) 500 MG tablet Take 500 mg by mouth every 6 (six) hours as needed for headache.   Yes [provider]  albuterol (PROVENTIL) (2.5 MG/3ML) 0.083% nebulizer solution Take 3 mLs (2.5 mg total) by nebulization every 6 (six) hours as needed for wheezing. 11/23/16  Yes Eugenie Filler, MD  amLODipine (NORVASC) 10 MG tablet Take 1 tablet (10 mg total) by mouth daily. 10/19/15  Yes Lelon Perla, MD  aspirin EC 81 MG tablet Take 81 mg by mouth at bedtime.   Yes [provider]  bacitracin ointment Apply 1 application topically 2 (two) times daily. For 10 days 10/16/17  Yes Duffy Bruce, MD  benzonatate (TESSALON) 100 MG capsule Take 200 mg by mouth 3 (three) times daily as needed for cough.  10/24/17  Yes [provider]  buPROPion (WELLBUTRIN XL) 150 MG 24 hr tablet Take 150 mg by mouth daily. 06/04/17  Yes [provider]  cholecalciferol (VITAMIN D) 1000 units tablet Take 1,000 Units by mouth at bedtime.    Yes [provider]  fluticasone (FLOVENT HFA) 220 MCG/ACT inhaler Inhale 2 puffs into the lungs 2 (two) times  daily.   Yes [provider]  furosemide (LASIX) 40 MG tablet Take 1 tablet (40 mg total) by mouth every other day. Patient taking differently: Take 20 mg by mouth every other day.  11/23/16  Yes Eugenie Filler, MD  glimepiride (AMARYL) 1 MG tablet Take 1 mg by mouth daily with breakfast.    Yes [provider]  guaifenesin (ROBITUSSIN) 100 MG/5ML syrup Take 50 mg by mouth 3 (three) times daily as needed for cough. Liquid Mucinex  Yes [provider]  ipratropium-albuterol (DUONEB) 0.5-2.5 (3) MG/3ML SOLN Take 3 mLs by nebulization 4 (four) times daily. Dx: J44.9 01/01/17  Yes Parrett, Tammy S, NP  irbesartan (AVAPRO) 150 MG tablet Take 1 tablet (150 mg total) by mouth daily. 10/12/14  Yes Lelon Perla, MD  Magnesium 250 MG TABS Take 250 mg by mouth at bedtime.    Yes [provider]  Multiple Vitamins-Minerals (OCUVITE-LUTEIN PO) Take 1 tablet by mouth every morning.    Yes [provider]  pravastatin (PRAVACHOL) 40 MG tablet Take 40 mg by mouth every evening.    Yes [provider]  predniSONE (DELTASONE) 20 MG tablet Take 40 mg by mouth daily. 10/24/17  Yes [provider]  PROAIR HFA 108 (90 BASE) MCG/ACT inhaler Inhale 2 puffs into the lungs 4 (four) times daily as needed for wheezing or shortness of breath.  02/24/14  Yes [provider]  sertraline (ZOLOFT) 100 MG tablet Take 1 tablet (100 mg total) by mouth every morning. 11/24/16  Yes Eugenie Filler, MD    Scheduled Meds: . amLODipine  10 mg Oral Daily  . aspirin EC  81 mg Oral QHS  . budesonide (PULMICORT) nebulizer solution  0.25 mg Nebulization BID  . buPROPion  150 mg Oral Daily  . cholecalciferol  1,000 Units Oral QHS  . furosemide  20 mg Oral QODAY  . insulin aspart  0-5 Units Subcutaneous QHS  . insulin aspart  0-9 Units Subcutaneous TID WC  . insulin glargine  10 Units Subcutaneous Q24H  . ipratropium-albuterol  3 mL Nebulization QID  . irbesartan   150 mg Oral Daily  . magnesium oxide  400 mg Oral Daily  . mouth rinse  15 mL Mouth Rinse BID  . methylPREDNISolone (SOLU-MEDROL) injection  40 mg Intravenous Q12H  . pantoprazole (PROTONIX) IV  40 mg Intravenous Q12H  . pravastatin  40 mg Oral QPM  . sertraline  100 mg Oral q morning - 10a  . sodium chloride flush  3 mL Intravenous Q12H  . sodium chloride flush  3 mL Intravenous Q12H   Infusions: . sodium chloride    . azithromycin Stopped (10/28/17 0745)   PRN Meds: sodium chloride, acetaminophen **OR** acetaminophen, albuterol, benzonatate, guaifenesin, ondansetron **OR** ondansetron (ZOFRAN) IV, sodium chloride flush   Allergies as of 10/26/2017 - Review Complete 10/26/2017  Allergen Reaction Noted  . Amaryl [glimepiride]  10/18/2013  . Avelox [moxifloxacin hcl in nacl]  10/18/2013  . Ciprofloxacin  10/18/2013  . Lipitor [atorvastatin] Other (See Comments) 10/18/2013  . Lisinopril Other (See Comments) 10/18/2013  . Other Other (See Comments) 10/18/2013  . Prevacid [lansoprazole]  10/18/2013  . Spiriva handihaler [tiotropium bromide monohydrate]  10/18/2013  . Symbicort [budesonide-formoterol fumarate]  10/18/2013    Family History  Problem Relation Age of Onset  . Emphysema Father   . Colon cancer Neg Hx   . Esophageal cancer Neg Hx   . Rectal cancer Neg Hx   . Stomach cancer Neg Hx     Social History   Socioeconomic History  . Marital status: Married    Spouse name: Jenny Reichmann  . Number of children: 1  . Years of education: Not on file  . Highest education level: Not on file  Social Needs  . Financial resource strain: Not on file  . Food insecurity - worry: Not on file  . Food insecurity - inability: Not on file  . Transportation needs - medical: Not on file  .  Transportation needs - non-medical: Not on file  Occupational History  . Occupation: retired Producer, television/film/video: RETIRED  Tobacco Use  . Smoking status: Former Smoker    Packs/day: 2.00    Years:  15.00    Pack years: 30.00    Types: Cigarettes    Last attempt to quit: 09/18/1972    Years since quitting: 45.1  . Smokeless tobacco: Never Used  Substance and Sexual Activity  . Alcohol use: No    Alcohol/week: 0.0 oz  . Drug use: No  . Sexual activity: No  Other Topics Concern  . Not on file  Social History Narrative  . Not on file    REVIEW OF SYSTEMS: Constitutional: Generally her weight is stable, although she tells me that when she comes in the hospital she loses around 10 pounds on average. ENT:  No nose bleeds Pulm: Pretty limited by her lung disease.  When she is at her best, she can only climb a few stairs before she gets dyspneic.  She is able to move around her house but if she does too much walking, she will also be dyspneic. CV:  No palpitations, no LE edema.  No chest pain.  No syncope. GU:  No hematuria, no frequency GI: No dysphagia..  See HPI Heme:  See HPI   Transfusions:  None ever Neuro:  No headaches, no peripheral tingling or numbness Derm:  No itching, no rash or sores.  Endocrine:  No sweats or chills.  No polyuria or dysuria Immunization: Vaccination for flu, Pneumovax, Tdap and zoster are up-to-date. Travel:  None beyond local counties in last few months.    PHYSICAL EXAM: Vital signs in last 24 hours: Vitals:   10/28/17 0600 10/28/17 0744  BP: (!) 160/61 (!) 143/60  Pulse: 67 68  Resp:  20  Temp:  97.6 F (36.4 C)  SpO2:  97%   Wt Readings from Last 3 Encounters:  10/28/17 46.6 kg (102 lb 11.2 oz)  10/16/17 46.3 kg (102 lb)  10/15/17 47.2 kg (104 lb)    General: Pleasant, thin, frail, dyspneic elderly WF.  She is alert and comfortable. Head: No facial asymmetry or swelling.  Laceration at the top posterior scalp is scabbed over and looks healthy. Eyes: Conjunctiva pale.  EOMI.  No scleral icterus. Ears: Not hard of hearing. Nose: No discharge or congestion. Mouth: Tongue midline.  Oral mucosa pink, moist, clear. Neck: No JVD, no  masses, no thyromegaly. Lungs: Dyspneic with speech even with nasal cannula oxygen in place.  Extensive expiratory wheezing and rhonchi to the point it is occult to hear the heart sounds. Heart: Pulse regular.  Due to the significant lung sounds, not able to discern fine aspects of heart sounds but the heart is regular. Abdomen: Not tender, not distended.  No masses, HSM, bruits, hernias.  Active bowel sounds..   Rectal: Did not perform rectal exam.  FOBT positive in the lab. Musc/Skeltl: Overall osteoporotic appearance but no gross joint swelling, redness or significant deformities.  Some arthritic changes in the hands/fingers. Extremities: No CCE.  Feet are warm with good perfusion Neurologic: Alert.  Oriented x3.  No tremor.  Moves all 4 limbs, strength not tested.  No gross deficits. Skin: No telangiectasia, sores, rashes.  No significant purpura or bruising Tattoos: None Nodes: No cervical adenopathy Psych: Pleasant, cooperative, speech fluid.  Intake/Output from previous day: 02/09 0701 - 02/10 0700 In: 1080 [P.O.:580; IV Piggyback:500] Out: 625 [Urine:625] Intake/Output this shift: No intake/output  data recorded.  LAB RESULTS: Recent Labs    10/26/17 1937 10/27/17 0620 10/28/17 0611  WBC 10.3  --  9.0  HGB 8.1* 7.9* 7.6*  HCT 27.5* 26.4* 25.8*  PLT 260  --  268   BMET Lab Results  Component Value Date   NA 138 10/27/2017   NA 138 10/26/2017   NA 135 03/02/2017   K 4.1 10/27/2017   K 3.9 10/26/2017   K 3.2 (L) 03/02/2017   CL 106 10/27/2017   CL 105 10/26/2017   CL 101 03/02/2017   CO2 17 (L) 10/27/2017   CO2 21 (L) 10/26/2017   CO2 23 03/02/2017   GLUCOSE 280 (H) 10/27/2017   GLUCOSE 184 (H) 10/26/2017   GLUCOSE 127 (H) 03/02/2017   BUN 34 (H) 10/27/2017   BUN 32 (H) 10/26/2017   BUN 51 (H) 03/02/2017   CREATININE 1.29 (H) 10/27/2017   CREATININE 1.30 (H) 10/26/2017   CREATININE 1.52 (H) 03/02/2017   CALCIUM 8.6 (L) 10/27/2017   CALCIUM 9.0 10/26/2017    CALCIUM 8.9 03/02/2017   LFT Recent Labs    10/26/17 1937  PROT 7.2  ALBUMIN 3.9  AST 36  ALT 41  ALKPHOS 64  BILITOT 0.5   PT/INR Lab Results  Component Value Date   INR 1.07 10/26/2017   INR 1.20 11/18/2015   INR 1.45 07/17/2013   Hepatitis Panel No results for input(s): HEPBSAG, HCVAB, HEPAIGM, HEPBIGM in the last 72 hours. C-Diff No components found for: CDIFF Lipase  No results found for: LIPASE  Drugs of Abuse  No results found for: LABOPIA, COCAINSCRNUR, LABBENZ, AMPHETMU, THCU, LABBARB   RADIOLOGY STUDIES: Dg Chest 2 View  Result Date: 10/27/2017 CLINICAL DATA:  84 y/o  F; wheezing and cough. EXAM: CHEST  2 VIEW COMPARISON:  10/26/2017 chest radiograph.  08/20/2017 chest CT. FINDINGS: Stable normal heart size. Aortic valve replacement. Aortic atherosclerosis with calcification. Emphysema. Right greater than left lung base bronchitic changes. Diminished right lung base opacity from prior study, probably atelectasis. No new focal consolidation. No pleural effusion or pneumothorax. Post median sternotomy with wires in alignment. No acute osseous abnormality is evident. Hiatal hernia. IMPRESSION: 1. Right greater than left lung base bronchitic changes. No focal consolidation. 2. Emphysema. 3. Aortic atherosclerosis. 4. Hiatal hernia. Electronically Signed   By: Kristine Garbe M.D.   On: 10/27/2017 02:53   Dg Chest 2 View  Result Date: 10/26/2017 CLINICAL DATA:  Shortness of breath EXAM: CHEST  2 VIEW COMPARISON:  PET CT 10/09/2017 Chest radiograph 03/27/2017 FINDINGS: Hyperinflated lungs with increased biapical lucency consistent with bullous emphysema. Increased opacity in the medial right lung base. The remote median sternotomy and prosthetic the aortic valve. No pleural effusion or pneumothorax. IMPRESSION: 1.  Emphysema (ICD10-J43.9). 2. Increased opacity in the medial right lung base could indicate developing pneumonia. Electronically Signed   By: Ulyses Jarred  M.D.   On: 10/26/2017 16:26      IMPRESSION:   *  Anemia.  FOBT positive stool. History adenomatous and hyperplastic colon polyps as well as nonbleeding diverticulosis.  Latest and what was deemed to be final colonoscopy in 2013. Previous symptoms of GERD, not present for a long time  *   Acute COPD flare.  By x-ray has possible right-sided evolving pneumonia.  Even at baseline she is oxygen dependent and has significant DOE.  High risk for sedation.  *  S/p 2014 CABG x 1 V and tissue AVR     PLAN:     *  Does not seem safe to pursue EGD which is test of choice.   Wonder about infusion of Iron (pharmacy can guide therapy).  If she is loaded up with iron perhaps she can overcome any GI blood loss contributing to anemia ?   Azucena Freed  10/28/2017, 9:01 AM Pager: [phone]

## 2017-10-28 NOTE — Progress Notes (Signed)
Pt is stable, but wheezing most of the day, on breathing treatment, vitals stable, oxygen via [place] continue, Asked RT for flutter valve, they said they will bring it, will continue to monitor the patient  Palma Holter, RN

## 2017-10-29 DIAGNOSIS — Z86010 Personal history of colonic polyps: Secondary | ICD-10-CM

## 2017-10-29 DIAGNOSIS — R195 Other fecal abnormalities: Secondary | ICD-10-CM

## 2017-10-29 DIAGNOSIS — D649 Anemia, unspecified: Secondary | ICD-10-CM

## 2017-10-29 DIAGNOSIS — D509 Iron deficiency anemia, unspecified: Secondary | ICD-10-CM

## 2017-10-29 LAB — GLUCOSE, CAPILLARY
Glucose-Capillary: 108 mg/dL — ABNORMAL HIGH (ref 65–99)
Glucose-Capillary: 236 mg/dL — ABNORMAL HIGH (ref 65–99)

## 2017-10-29 LAB — BASIC METABOLIC PANEL
ANION GAP: 12 (ref 5–15)
BUN: 40 mg/dL — ABNORMAL HIGH (ref 6–20)
CALCIUM: 9.4 mg/dL (ref 8.9–10.3)
CO2: 22 mmol/L (ref 22–32)
Chloride: 105 mmol/L (ref 101–111)
Creatinine, Ser: 1.38 mg/dL — ABNORMAL HIGH (ref 0.44–1.00)
GFR, EST AFRICAN AMERICAN: 40 mL/min — AB (ref >60)
GFR, EST NON AFRICAN AMERICAN: 34 mL/min — AB (ref >60)
Glucose, Bld: 111 mg/dL — ABNORMAL HIGH (ref 65–99)
Potassium: 3.9 mmol/L (ref 3.5–5.1)
Sodium: 139 mmol/L (ref 135–145)

## 2017-10-29 LAB — CBC
HEMATOCRIT: 26.5 % — AB (ref 36.0–46.0)
Hemoglobin: 7.8 g/dL — ABNORMAL LOW (ref 12.0–15.0)
MCH: 22.5 pg — ABNORMAL LOW (ref 26.0–34.0)
MCHC: 29.4 g/dL — ABNORMAL LOW (ref 30.0–36.0)
MCV: 76.6 fL — ABNORMAL LOW (ref 78.0–100.0)
PLATELETS: 282 10*3/uL (ref 150–400)
RBC: 3.46 MIL/uL — AB (ref 3.87–5.11)
RDW: 16.6 % — AB (ref 11.5–15.5)
WBC: 9.5 10*3/uL (ref 4.0–10.5)

## 2017-10-29 MED ORDER — PREDNISONE 10 MG PO TABS: ORAL_TABLET | ORAL | 0 refills | Status: DC

## 2017-10-29 MED ORDER — GUAIFENESIN 100 MG/5ML PO SYRP: 5.0000 mL | ORAL_SOLUTION | Freq: Four times a day (QID) | ORAL | Status: DC | PRN

## 2017-10-29 MED ORDER — SODIUM CHLORIDE 0.9 % IV SOLN
1000.0000 mg | Freq: Once | INTRAVENOUS | Status: AC
  Administered 2017-10-29: 1000 mg via INTRAVENOUS
  Filled 2017-10-29: qty 20

## 2017-10-29 MED ORDER — FUROSEMIDE 40 MG PO TABS: 20.0000 mg | ORAL_TABLET | ORAL | Status: DC

## 2017-10-29 MED ORDER — DOCUSATE SODIUM 100 MG PO CAPS: 100.0000 mg | ORAL_CAPSULE | Freq: Two times a day (BID) | ORAL | 0 refills | Status: AC

## 2017-10-29 MED ORDER — SODIUM CHLORIDE 0.9 % IV SOLN
25.0000 mg | Freq: Once | INTRAVENOUS | Status: AC
  Administered 2017-10-29: 25 mg via INTRAVENOUS
  Filled 2017-10-29: qty 0.5

## 2017-10-29 MED ORDER — HYDROCODONE-ACETAMINOPHEN 5-325 MG PO TABS
ORAL_TABLET | ORAL | Status: AC
  Filled 2017-10-29: qty 2

## 2017-10-29 MED ORDER — PANTOPRAZOLE SODIUM 40 MG PO TBEC: 40.0000 mg | DELAYED_RELEASE_TABLET | Freq: Two times a day (BID) | ORAL | 0 refills | Status: DC

## 2017-10-29 MED ORDER — FERROUS SULFATE 325 (65 FE) MG PO TABS: 325.0000 mg | ORAL_TABLET | Freq: Two times a day (BID) | ORAL | 0 refills | Status: DC

## 2017-10-29 MED ORDER — PREDNISONE 20 MG PO TABS
40.0000 mg | ORAL_TABLET | Freq: Every day | ORAL | Status: DC
  Administered 2017-10-29: 40 mg via ORAL
  Filled 2017-10-29: qty 2

## 2017-10-29 NOTE — Progress Notes (Signed)
     [facility] Progress Note  CC:  Anemia and heme positive stools  Subjective:  Feeling better, breathing is better today.  No BM.  Is currently receiving a dose of IV iron.  Mentioned that the hospitalist plans to send her home today after her iron infusion.  Objective:  Vital signs in last 24 hours: Temp:  [98 F (36.7 C)-98.7 F (37.1 C)] 98 F (36.7 C) (02/11 0400) Pulse Rate:  [68-81] 68 (02/11 0400) Resp:  [18-20] 18 (02/11 0400) BP: (132-154)/(49-62) 154/62 (02/11 0400) SpO2:  [96 %-100 %] 99 % (02/11 0812) Weight:  [101 lb 1.6 oz (45.9 kg)] 101 lb 1.6 oz (45.9 kg) (02/11 0400) Last BM Date: 10/26/17 General:  Alert, Well-developed, in NAD Heart:  Regular rate and rhythm; no murmurs Pulm:  Expiratory wheezing noted. Abdomen:  Soft, non-distended.  BS present.  Non-tender. Extremities:  Without edema. Neurologic:  Alert and oriented x 4;  grossly normal neurologically. Psych:  Alert and cooperative. Normal mood and affect.  Intake/Output from previous day: 02/10 0701 - 02/11 0700 In: 940 [P.O.:940] Out: 1825 [Urine:1825] Intake/Output this shift: Total I/O In: -  Out: 450 [Urine:450]  Lab Results: Recent Labs    10/26/17 1937 10/27/17 0620 10/28/17 0611 10/29/17 0552  WBC 10.3  --  9.0 9.5  HGB 8.1* 7.9* 7.6* 7.8*  HCT 27.5* 26.4* 25.8* 26.5*  PLT 260  --  268 282   BMET Recent Labs    10/26/17 1937 10/27/17 0620 10/29/17 0552  NA 138 138 139  K 3.9 4.1 3.9  CL 105 106 105  CO2 21* 17* 22  GLUCOSE 184* 280* 111*  BUN 32* 34* 40*  CREATININE 1.30* 1.29* 1.38*  CALCIUM 9.0 8.6* 9.4   LFT Recent Labs    10/26/17 1937  PROT 7.2  ALBUMIN 3.9  AST 36  ALT 41  ALKPHOS 64  BILITOT 0.5   PT/INR Recent Labs    10/26/17 1937  LABPROT 13.8  INR 1.07   Assessment / Plan: *  Anemia.  FOBT positive stool. History adenomatous and hyperplastic colon polyps as well as nonbleeding diverticulosis.  Latest and what was deemed to be  final colonoscopy in 2013. Previous symptoms of GERD, not present for a long time.  No sign of overt bleeding.  She is receiving a dose of IV iron currently.  Hgb was stable this AM.  *   Acute COPD flare.  By x-ray has possible right-sided evolving pneumonia.  Even at baseline she is oxygen dependent and has significant DOE.  High risk for sedation.  *  S/p 2014 CABG x 1 V and tissue AVR   **Can be discharged later today after her IV iron infusion.  Should remain on pantoprazole 40 mg BID.  She can follow-up with her PCP for monitoring of her Hgb as we do not intend to perform any procedures at this point unless she were to have overt bleeding and/or fails to maintain her Hgb with iron support.   LOS: 2 days   Laban Emperor. Zehr  10/29/2017, 9:46 AM  Pager number [phone]     GI Attending   I have taken an interval history, reviewed the chart and examined the patient. I agree with the Advanced Practitioner's note, impression and recommendations.    Gatha Mayer, MD, [facility] [phone] (pager) 10/29/2017 4:47 PM

## 2017-10-29 NOTE — Discharge Instructions (Signed)
Please get your medications reviewed and adjusted by your Primary MD. ° °Please request your Primary MD to go over all Hospital Tests and Procedure/Radiological results at the follow up, please get all Hospital records sent to your Prim MD by signing hospital release before you go home. ° °If you had Pneumonia of Lung problems at the Hospital: °Please get a 2 view Chest X ray done in 6-8 weeks after hospital discharge or sooner if instructed by your Primary MD. ° °If you have Congestive Heart Failure: °Please call your Cardiologist or Primary MD anytime you have any of the following symptoms:  °1) 3 pound weight gain in 24 hours or 5 pounds in 1 week  °2) shortness of breath, with or without a dry hacking cough  °3) swelling in the hands, feet or stomach  °4) if you have to sleep on extra pillows at night in order to breathe ° °Follow cardiac low salt diet and 1.5 lit/day fluid restriction. ° °If you have diabetes °Accuchecks 4 times/day, Once in AM empty stomach and then before each meal. °Log in all results and show them to your primary doctor at your next visit. °If any glucose reading is under 80 or above 300 call your primary MD immediately. ° °If you have Seizure/Convulsions/Epilepsy: °Please do not drive, operate heavy machinery, participate in activities at heights or participate in high speed sports until you have seen by Primary MD or a Neurologist and advised to do so again. ° °If you had Gastrointestinal Bleeding: °Please ask your Primary MD to check a complete blood count within one week of discharge or at your next visit. Your endoscopic/colonoscopic biopsies that are pending at the time of discharge, will also need to followed by your Primary MD. ° °Get Medicines reviewed and adjusted. °Please take all your medications with you for your next visit with your Primary MD ° °Please request your Primary MD to go over all hospital tests and procedure/radiological results at the follow up, please ask your  Primary MD to get all Hospital records sent to his/her office. ° °If you experience worsening of your admission symptoms, develop shortness of breath, life threatening emergency, suicidal or homicidal thoughts you must seek medical attention immediately by calling 911 or calling your MD immediately  if symptoms less severe. ° °You must read complete instructions/literature along with all the possible adverse reactions/side effects for all the Medicines you take and that have been prescribed to you. Take any new Medicines after you have completely understood and accpet all the possible adverse reactions/side effects.  ° °Do not drive or operate heavy machinery when taking Pain medications.  ° °Do not take more than prescribed Pain, Sleep and Anxiety Medications ° °Special Instructions: If you have smoked or chewed Tobacco  in the last 2 yrs please stop smoking, stop any regular Alcohol  and or any Recreational drug use. ° °Wear Seat belts while driving. ° °Please note °You were cared for by a hospitalist during your hospital stay. If you have any questions about your discharge medications or the care you received while you were in the hospital after you are discharged, you can call the unit and asked to speak with the hospitalist on call if the hospitalist that took care of you is not available. Once you are discharged, your primary care physician will handle any further medical issues. Please note that NO REFILLS for any discharge medications will be authorized once you are discharged, as it is imperative that you   return to your primary care physician (or establish a relationship with a primary care physician if you do not have one) for your aftercare needs so that they can reassess your need for medications and monitor your lab values.  You can reach the hospitalist office at phone [phone] or fax [phone]   If you do not have a primary care physician, you can call [phone] for a physician  referral.   Iron Deficiency Anemia, Adult Iron-deficiency anemia is when you have a low amount of red blood cells or hemoglobin. This happens because you have too little iron in your body. Hemoglobin carries oxygen to parts of the body. Anemia can cause your body to not get enough oxygen. It may or may not cause symptoms. Follow these instructions at home: Medicines  Take over-the-counter and prescription medicines only as told by your doctor. This includes iron pills (supplements) and vitamins.  If you cannot handle taking iron pills by mouth, ask your doctor about getting iron through: ? A vein (intravenously). ? A shot (injection) into a muscle.  Take iron pills when your stomach is empty. If you cannot handle this, take them with food.  Do not drink milk or take antacids at the same time as your iron pills.  To prevent trouble pooping (constipation), eat fiber or take medicine (stool softener) as told by your doctor. Eating and drinking  Talk with your doctor before changing the foods you eat. He or she may tell you to eat foods that have a lot of iron, such as: ? Liver. ? Lowfat (lean) beef. ? Breads and cereals that have iron added to them (fortified breads and cereals). ? Eggs. ? Dried fruit. ? Dark green, leafy vegetables.  Drink enough fluid to keep your pee (urine) clear or pale yellow.  Eat fresh fruits and vegetables that are high in vitamin C. They help your body to use iron. Foods with a lot of vitamin C include: ? Oranges. ? Peppers. ? Tomatoes. ? Mangoes. General instructions  Return to your normal activities as told by your doctor. Ask your doctor what activities are safe for you.  Keep yourself clean, and keep things clean around you (your surroundings). Anemia can make you get sick more easily.  Keep all follow-up visits as told by your doctor. This is important. Contact a doctor if:  You feel sick to your stomach (nauseous).  You throw up  (vomit).  You feel weak.  You are sweating for no clear reason.  You have trouble pooping, such as: ? Pooping (having a bowel movement) less than 3 times a week. ? Straining to poop. ? Having poop that is hard, dry, or larger than normal. ? Feeling full or bloated. ? Pain in the lower belly. ? Not feeling better after pooping. Get help right away if:  You pass out (faint). If this happens, do not drive yourself to the hospital. Call your local emergency services (911 in the [place]  You have chest pain.  You have shortness of breath that: ? Is very bad. ? Gets worse with physical activity.  You have a fast heartbeat.  You get light-headed when getting up from sitting or lying down. This information is not intended to replace advice given to you by your health care provider. Make sure you discuss any questions you have with your health care provider. Document Released: 10/07/2010 Document Revised: 05/24/2016 Document Reviewed: 05/24/2016 Elsevier Interactive Patient Education  2017 [employer]  Chronic Obstructive Pulmonary Disease Exacerbation  Chronic obstructive pulmonary disease (COPD) is a common lung problem. In COPD, the flow of air from the lungs is limited. COPD exacerbations are times that breathing gets worse and you need extra treatment. Without treatment they can be life threatening. If they happen often, your lungs can become more damaged. If your COPD gets worse, your doctor may treat you with:  Medicines.  Oxygen.  Different ways to clear your airway, such as using a mask.  Follow these instructions at home:  Do not smoke.  Avoid tobacco smoke and other things that bother your lungs.  If given, take your antibiotic medicine as told. Finish the medicine even if you start to feel better.  Only take medicines as told by your doctor.  Drink enough fluids to keep your pee (urine) clear or pale yellow (unless your doctor has told you not to).  Use a cool  mist machine (vaporizer).  If you use oxygen or a machine that turns liquid medicine into a mist (nebulizer), continue to use them as told.  Keep up with shots (vaccinations) as told by your doctor.  Exercise regularly.  Eat healthy foods.  Keep all doctor visits as told. Get help right away if:  You are very short of breath and it gets worse.  You have trouble talking.  You have bad chest pain.  You have blood in your spit (sputum).  You have a fever.  You keep throwing up (vomiting).  You feel weak, or you pass out (faint).  You feel confused.  You keep getting worse. This information is not intended to replace advice given to you by your health care provider. Make sure you discuss any questions you have with your health care provider. Document Released: 08/24/2011 Document Revised: 02/10/2016 Document Reviewed: 05/09/2013 Elsevier Interactive Patient Education  2017 [employer].

## 2017-10-29 NOTE — Evaluation (Signed)
Physical Therapy Evaluation Patient Details Name: [name] MRN: [id number] DOB: 1932-09-15 Today's Date: 10/29/2017   History of Present Illness  [name] is an 84yo white female who comes to [facility] on 2/9 from home with SOB and wheezing, admitted for COPD exacerbation, noted to also have anemia in 7s. PMH: COPD on 2l/min, depression, DM2, HTN, CAD s/p CBAG. PTA pt performed limited community distance aMB with O2 and intermittent SPC use, pt estimated 5-6 falls in past 3 months with 3 related ED visits. Pt was current with OPPT PTA for balance impairment.   Clinical Impression  Pt admitted with above diagnosis. Pt currently with functional limitations due to the deficits listed below (see "PT Problem List"). Upon entry, the patient is received semirecumbent in bed, no family/caregiver present. The pt is awake and agreeable to participate. No acute distress noted at this time. The pt is alert and oriented x3, pleasant, conversational, and following simple and multi-step commands consistently. Pt received on 2LPM O2 (her baseline flow rate) with SpO2: 95% at rest, and SpO2: 91% after 40 feet AMB. Functional mobility assessment demonstrates mild strength impairment in trunk and BLE, the pt now requiring increased effort to perform bed mobility and transfers, whereas AMB is more limited by gait instability and increased DOE compared to baseline.  The patient estimates a 25% decrease in strength globally compared to baseline. Empirically, the patient demonstrates increased risk of recurrent falls AEB gait speed <0.76m/s, forward reach <5", and multiple LOB demonstrated throughout session. Pt will benefit from skilled PT intervention to increase independence and safety with basic mobility in preparation for discharge to the venue listed below.       Follow Up Recommendations Outpatient PT(continue with OPPT ([facility] on [address]) )    Equipment Recommendations  None recommended by PT     Recommendations for Other Services       Precautions / Restrictions Precautions Precautions: Fall Restrictions Weight Bearing Restrictions: No      Mobility  Bed Mobility Overal bed mobility: Modified Independent                Transfers Overall transfer level: Needs assistance Equipment used: None Transfers: Sit to/from Stand Sit to Stand: Min guard         General transfer comment: minimal effort required, no LOB standing at bedside   Ambulation/Gait Ambulation/Gait assistance: Min assist Ambulation Distance (Feet): 40 Feet(2 bouts of 40 feet with 3-4 minutes recovery between d/t SOB and desaturation, DOE) Assistive device: 1 person hand held assist Gait Pattern/deviations: Drifts right/left   Gait velocity interpretation: <1.8 ft/sec, indicative of risk for recurrent falls General Gait Details: slight LOB with retroAMB/AMB   Stairs            Wheelchair Mobility    Modified Rankin (Stroke Patients Only)       Balance Overall balance assessment: No apparent balance deficits (not formally assessed);Needs assistance;History of Falls Sitting-balance support: No upper extremity supported;Feet unsupported Sitting balance-Leahy Scale: Good     Standing balance support: Single extremity supported;During functional activity Standing balance-Leahy Scale: Fair               High level balance activites: Side stepping;Backward walking;Direction changes;Sudden stops;Turns(LOB with: )               Pertinent Vitals/Pain Pain Assessment: No/denies pain    Home Living Family/patient expects to be discharged to:: Private residence Living Arrangements: Spouse/significant other Available Help at Discharge: Family;Available 24 hours/day  Type of Home: House Home Access: Stairs to enter Entrance Stairs-Rails: Right Entrance Stairs-Number of Steps: 4 Home Layout: Two level;Laundry or work area in basement;Able to live on main level with  bedroom/bathroom Home Equipment: Grab bars - tub/shower;Walker - 2 wheels;Walker - 4 wheels;Cane - single point      Prior Function Level of Independence: Independent with assistive device(s)               Hand Dominance   Dominant Hand: Right    Extremity/Trunk Assessment   Upper Extremity Assessment Upper Extremity Assessment: Overall WFL for tasks assessed    Lower Extremity Assessment Lower Extremity Assessment: Generalized weakness;Overall WFL for tasks assessed       Communication   Communication: No difficulties  Cognition Arousal/Alertness: Awake/alert Behavior During Therapy: WFL for tasks assessed/performed Overall Cognitive Status: Within Functional Limits for tasks assessed                                        General Comments      Exercises     Assessment/Plan    PT Assessment Patient needs continued PT services  PT Problem List Decreased strength;Decreased activity tolerance;Decreased balance;Decreased mobility       PT Treatment Interventions DME instruction;Balance training;Gait training;Stair training;Functional mobility training;Therapeutic activities;Therapeutic exercise;Patient/family education    PT Goals (Current goals can be found in the Care Plan section)  Acute Rehab PT Goals Patient Stated Goal: return to home continue with OPPT to reduce balance impairment  PT Goal Formulation: With patient Time For Goal Achievement: 11/12/17 Potential to Achieve Goals: Good    Frequency Min 3X/week   Barriers to discharge        Co-evaluation               AM-PAC PT "6 Clicks" Daily Activity  Outcome Measure Difficulty turning over in bed (including adjusting bedclothes, sheets and blankets)?: A Little Difficulty moving from lying on back to sitting on the side of the bed? : A Little Difficulty sitting down on and standing up from a chair with arms (e.g., wheelchair, bedside commode, etc,.)?: A Little Help  needed moving to and from a bed to chair (including a wheelchair)?: A Little Help needed walking in hospital room?: A Lot Help needed climbing 3-5 steps with a railing? : A Lot 6 Click Score: 16    End of Session Equipment Utilized During Treatment: Gait belt Activity Tolerance: Patient limited by fatigue;Other (comment);Treatment limited secondary to medical complications (Comment) Patient left: in chair;with chair alarm set;with call bell/phone within reach Nurse Communication: Mobility status PT Visit Diagnosis: Unsteadiness on feet (R26.81);Repeated falls (R29.6)    Time: 0962-8366 PT Time Calculation (min) (ACUTE ONLY): 23 min   Charges:   PT Evaluation $PT Eval Moderate Complexity: 1 Mod PT Treatments $Therapeutic Activity: 8-22 mins   PT G Codes:        1:58 PM, 12-Nov-2017 Etta Grandchild, PT, DPT [facility] - [place] [phone] (Pager)  [phone] (Mobile)  [phone] (Office)      Gustavus Haskin C 11-12-2017, 1:56 PM

## 2017-10-29 NOTE — Progress Notes (Signed)
Pt has orders to be discharged. Discharge instructions given and pt has no additional questions at this time. Medication regimen reviewed and pt educated. Pt verbalized understanding and has no additional questions. Telemetry box removed. IV removed and site in good condition. Pt stable and waiting for transportation. 

## 2017-10-29 NOTE — Progress Notes (Signed)
Referral sent to the [facility] ( [place] ) as requested; order/ clinical information faxed to [phone]; Mindi Slicker [facility] [phone]

## 2017-10-29 NOTE — Discharge Summary (Signed)
Physician Discharge Summary  [name] GGY:[id number] DOB: [birth year]  PCP: Harlan Stains, MD  Admit date: 10/27/2017 Discharge date: 10/29/2017  Recommendations for Outpatient Follow-up:  1. Dr. Harlan Stains, PCP in 4 days with repeat labs (CBC & BMP). 2. Eric Form, NP/Pulmonology in 1 week. 3. Since patient is supposed to have a repeat CT chest to further evaluate her pulmonary nodule as outpatient, she should not need a repeat chest x-ray.  Home Health: Resume outpatient PT. Equipment/Devices: None.  Discharge Condition: Improved and stable. CODE STATUS: Full. Diet recommendation: Heart healthy & diabetic diet.  Discharge Diagnoses:  Principal Problem:   COPD exacerbation ([place]) Active Problems:   Acute on chronic respiratory failure with hypoxia (HCC)   CKD (chronic kidney disease), stage III (HCC)   Diabetes mellitus type 2, uncontrolled (HCC)   Chronic diastolic CHF (congestive heart failure) (HCC)   CAD (coronary artery disease)   Microcytic anemia   Anemia due to GI blood loss   COPD with acute exacerbation (HCC)   Brief Summary: 84 year old female with PMH of COPD, chronic hypoxic respiratory failure on home oxygen 2 L/min, DM 2, HTN, HLD, CAD, CABG, aortic stenosis status post bioprosthetic AVR presented to ED with 3-4 weeks history of progressive generalized weakness and few days history of cough, dyspnea that failed outpatient treatment with prednisone taper.  Lab work by PCP showed hemoglobin 8.1, lower than prior and advised to come to the hospital.  Admitted for COPD exacerbation and microcytic anemia, possibly symptomatic.   Assessment & Plan:   1. COPD exacerbation: Possibly precipitated by acute viral bronchitis.  Treated with azithromycin, bronchodilator nebulizations, IV Solu-Medrol.  Added flutter valve.  Improved.  Completed 3 days of azithromycin, discontinued.  Transitioned to oral prednisone taper at discharge.  Continue prior home bronchodilator  inhalers/nebulizers and Flovent. 2. Acute on chronic hypoxic respiratory failure: More than prior home oxygen needs on admission.  Secondary to problem #1 complicating anemia.  Treatment as above and titrate oxygen to prior home levels.  Improved. Back on home oxygen 2 L/min via nasal cannula. 3. Microcytic (iron deficiency) anemia/FOBT +: Hemoglobin 8.1 on admission, down from 10.7 in March 2018.  Denies overt GI bleed.  FOBT +.  Last colonoscopy by [facility] in 2013 showed diverticulosis.?  Related to slow GI bleed and chronic kidney disease.  Anemia panel reviewed.  Suggestive of iron deficiency.  Hemoglobin has dropped to 7.6.  [facility] consulted for further evaluation and their input appreciated.  Patient was not keen on endoscopy evaluation and was also felt to be high risk due to her advanced COPD.  GI recommended Protonix 40 mg twice daily, outpatient follow-up with PCP and no procedures planned unless she were to have overt bleeding and/or fails to maintain her hemoglobin with iron support.  Received a dose of IV iron 2/11 prior to discharge.  Continue oral iron supplements and Colace to avoid constipation. 4. Type II DM: A1c: 6.9.  Held oral hypoglycemics.  Low-dose Lantus, SSI.  Adjust insulins as needed.    Mildly uncontrolled but should improve as steroids are tapered off.  Continue Amaryl at discharge. 5. Essential hypertension: Mildly uncontrolled.  Continue amlodipine and irbesartan. 6. Anxiety and depression: Continue Zoloft and Wellbutrin.  Stable. 7. CAD status post CABG: No chest pain reported.  Continue aspirin. 8. Status post bioprosthetic AVR: Continue aspirin.   9. Stage III chronic kidney disease: Creatinine likely at baseline.   10. Pulmonary nodule: Being closely followed and evaluated by outpatient  pulmonology.     Consultants:  [facility]  Procedures:  None    Discharge Instructions  Discharge Instructions    (HEART FAILURE PATIENTS) Call MD:  Anytime you  have any of the following symptoms: 1) 3 pound weight gain in 24 hours or 5 pounds in 1 week 2) shortness of breath, with or without a dry hacking cough 3) swelling in the hands, feet or stomach 4) if you have to sleep on extra pillows at night in order to breathe.   Complete by:  As directed    Call MD for:  difficulty breathing, headache or visual disturbances   Complete by:  As directed    Call MD for:  extreme fatigue   Complete by:  As directed    Call MD for:  persistant dizziness or light-headedness   Complete by:  As directed    Call MD for:  severe uncontrolled pain   Complete by:  As directed    Call MD for:  temperature >100.4   Complete by:  As directed    Diet - low sodium heart healthy   Complete by:  As directed    Diet Carb Modified   Complete by:  As directed    Increase activity slowly   Complete by:  As directed        Medication List    TAKE these medications   acetaminophen 500 MG tablet Commonly known as:  TYLENOL Take 500 mg by mouth every 6 (six) hours as needed for headache.   amLODipine 10 MG tablet Commonly known as:  NORVASC Take 1 tablet (10 mg total) by mouth daily.   aspirin EC 81 MG tablet Take 81 mg by mouth at bedtime.   bacitracin ointment Apply 1 application topically 2 (two) times daily. For 10 days   benzonatate 100 MG capsule Commonly known as:  TESSALON Take 200 mg by mouth 3 (three) times daily as needed for cough.   buPROPion 150 MG 24 hr tablet Commonly known as:  WELLBUTRIN XL Take 150 mg by mouth daily.   cholecalciferol 1000 units tablet Commonly known as:  VITAMIN D Take 1,000 Units by mouth at bedtime.   docusate sodium 100 MG capsule Commonly known as:  COLACE Take 1 capsule (100 mg total) by mouth 2 (two) times daily.   ferrous sulfate 325 (65 FE) MG tablet Take 1 tablet (325 mg total) by mouth 2 (two) times daily with a meal.   fluticasone 220 MCG/ACT inhaler Commonly known as:  FLOVENT HFA Inhale 2 puffs  into the lungs 2 (two) times daily.   furosemide 40 MG tablet Commonly known as:  LASIX Take 0.5 tablets (20 mg total) by mouth every other day.   glimepiride 1 MG tablet Commonly known as:  AMARYL Take 1 mg by mouth daily with breakfast.   guaifenesin 100 MG/5ML syrup Commonly known as:  ROBITUSSIN Take 5 mLs by mouth 4 (four) times daily as needed for cough or congestion. Liquid Mucinex What changed:    how much to take  when to take this  reasons to take this   ipratropium-albuterol 0.5-2.5 (3) MG/3ML Soln Commonly known as:  DUONEB Take 3 mLs by nebulization 4 (four) times daily. Dx: J44.9   irbesartan 150 MG tablet Commonly known as:  AVAPRO Take 1 tablet (150 mg total) by mouth daily.   Magnesium 250 MG Tabs Take 250 mg by mouth at bedtime.   OCUVITE-LUTEIN PO Take 1 tablet by mouth every morning.  pantoprazole 40 MG tablet Commonly known as:  PROTONIX Take 1 tablet (40 mg total) by mouth 2 (two) times daily.   pravastatin 40 MG tablet Commonly known as:  PRAVACHOL Take 40 mg by mouth every evening.   predniSONE 10 MG tablet Commonly known as:  DELTASONE Take 4 tabs daily for 3 days, then 3 tabs daily for 3 days, then 2 tabs daily for 3 days, then 1 tab daily for 3 days, then stop. Start taking on:  10/30/2017 What changed:    medication strength  how much to take  how to take this  when to take this  additional instructions   PROAIR HFA 108 (90 Base) MCG/ACT inhaler Generic drug:  albuterol Inhale 2 puffs into the lungs 4 (four) times daily as needed for wheezing or shortness of breath.   albuterol (2.5 MG/3ML) 0.083% nebulizer solution Commonly known as:  PROVENTIL Take 3 mLs (2.5 mg total) by nebulization every 6 (six) hours as needed for wheezing.   sertraline 100 MG tablet Commonly known as:  ZOLOFT Take 1 tablet (100 mg total) by mouth every morning.      Follow-up Information    Harlan Stains, MD. Schedule an appointment as soon  as possible for a visit in 4 day(s).   Specialty:  Family Medicine Why:  To be seen with repeat labs (CBC & BMP). Contact information: [address] [place] [address] [phone]          Allergies  Allergen Reactions  . Lipitor [Atorvastatin] Other (See Comments)    myalgias myalgias  . Lisinopril Other (See Comments)    cough cough  . Amaryl [Glimepiride]     Side effects  . Avelox [Moxifloxacin Hcl In Nacl]     nausea  . Ciprofloxacin     nausea  . Other Other (See Comments)    Increased cough  . Prevacid [Lansoprazole]     diarrhea  . Spiriva Handihaler [Tiotropium Bromide Monohydrate]     Increased cough  . Symbicort [Budesonide-Formoterol Fumarate] Cough    Increased cough      Procedures/Studies: Dg Chest 2 View  Result Date: 10/27/2017 CLINICAL DATA:  84 y/o  F; wheezing and cough. EXAM: CHEST  2 VIEW COMPARISON:  10/26/2017 chest radiograph.  08/20/2017 chest CT. FINDINGS: Stable normal heart size. Aortic valve replacement. Aortic atherosclerosis with calcification. Emphysema. Right greater than left lung base bronchitic changes. Diminished right lung base opacity from prior study, probably atelectasis. No new focal consolidation. No pleural effusion or pneumothorax. Post median sternotomy with wires in alignment. No acute osseous abnormality is evident. Hiatal hernia. IMPRESSION: 1. Right greater than left lung base bronchitic changes. No focal consolidation. 2. Emphysema. 3. Aortic atherosclerosis. 4. Hiatal hernia. Electronically Signed   By: Kristine Garbe M.D.   On: 10/27/2017 02:53   Dg Chest 2 View  Result Date: 10/26/2017 CLINICAL DATA:  Shortness of breath EXAM: CHEST  2 VIEW COMPARISON:  PET CT 10/09/2017 Chest radiograph 03/27/2017 FINDINGS: Hyperinflated lungs with increased biapical lucency consistent with bullous emphysema. Increased opacity in the medial right lung base. The remote median sternotomy and prosthetic the  aortic valve. No pleural effusion or pneumothorax. IMPRESSION: 1.  Emphysema (ICD10-J43.9). 2. Increased opacity in the medial right lung base could indicate developing pneumonia. Electronically Signed   By: Ulyses Jarred M.D.   On: 10/26/2017 16:26     Subjective: Breathing almost back to baseline.  Dyspnea significantly improved compared to admission.  Some anterior  chest wall pain from coughing but cough has subsided significantly.  No fever or chills.  No dizziness or lightheadedness.  No BM since admission.  Again expresses reluctance to undergo any form of invasive procedures i.e. endoscopies that were discussed by  GI.  Discharge Exam:  Vitals:   10/29/17 1016 10/29/17 1212 10/29/17 1330 10/29/17 1437  BP:  138/61    Pulse:  67 93   Resp:  16    Temp: 98.5 F (36.9 C) 98.1 F (36.7 C)    TempSrc: Oral Oral    SpO2:  99% 91% 92%  Weight:      Height:        General exam: Elderly female, small built, frail, sitting up comfortably in bed without distress.   Respiratory system: Improved breath sounds. Slightly distant sounding breath sounds but clear to auscultation without wheezing, rhonchi or crackles.  No increased work of breathing. Cardiovascular system: S1 & S2 heard, RRR. No JVD, rubs, gallops or clicks. No pedal edema.  Grade 2 x 6 systolic ejection murmur best heard at apex.  Telemetry personally reviewed: Sinus rhythm with BBB morphology. Gastrointestinal system: Abdomen is nondistended, soft and nontender. No organomegaly or masses felt. Normal bowel sounds heard. Central nervous system: Alert and oriented. No focal neurological deficits.  Extremities: Symmetric 5 x 5 power. Skin: No rashes, lesions or ulcers Psychiatry: Judgement and insight appear normal. Mood & affect appropriate.      The results of significant diagnostics from this hospitalization (including imaging, microbiology, ancillary and laboratory) are listed below for reference.       Labs: CBC: Recent Labs  Lab 10/26/17 1937 10/27/17 0620 10/28/17 0611 10/29/17 0552  WBC 10.3  --  9.0 9.5  HGB 8.1* 7.9* 7.6* 7.8*  HCT 27.5* 26.4* 25.8* 26.5*  MCV 76.8*  --  76.6* 76.6*  PLT 260  --  268 203   Basic Metabolic Panel: Recent Labs  Lab 10/26/17 1937 10/27/17 0620 10/29/17 0552  NA 138 138 139  K 3.9 4.1 3.9  CL 105 106 105  CO2 21* 17* 22  GLUCOSE 184* 280* 111*  BUN 32* 34* 40*  CREATININE 1.30* 1.29* 1.38*  CALCIUM 9.0 8.6* 9.4   Liver Function Tests: Recent Labs  Lab 10/26/17 1937  AST 36  ALT 41  ALKPHOS 64  BILITOT 0.5  PROT 7.2  ALBUMIN 3.9   BNP (last 3 results) Recent Labs    11/19/16 0955 10/27/17 0302  BNP 436.4* 175.9*   CBG: Recent Labs  Lab 10/28/17 1151 10/28/17 1612 10/28/17 2043 10/29/17 0736 10/29/17 1118  GLUCAP 167* 226* 140* 108* 236*   Hgb A1c Recent Labs    10/27/17 1647  HGBA1C 6.9*   Anemia work up Recent Labs    10/27/17 1647  VITAMINB12 681  FOLATE 39.0  FERRITIN 45  TIBC 410  IRON 17*  RETICCTPCT 0.9       Time coordinating discharge: Over 30 minutes  SIGNED:  Vernell Leep, MD, FACP, [facility]. [facility] Pager [phone]  If 7PM-7AM, please contact night-coverage [email or web address] Password [facility] 10/29/2017, 4:04 PM

## 2017-10-29 NOTE — Progress Notes (Signed)
MEDICATION RELATED CONSULT NOTE - INITIAL   Pharmacy Consult for IV iron  Indication: anemia  Allergies  Allergen Reactions  . Lipitor [Atorvastatin] Other (See Comments)    myalgias myalgias  . Lisinopril Other (See Comments)    cough cough  . Amaryl [Glimepiride]     Side effects  . Avelox [Moxifloxacin Hcl In Nacl]     nausea  . Ciprofloxacin     nausea  . Other Other (See Comments)    Increased cough  . Prevacid [Lansoprazole]     diarrhea  . Spiriva Handihaler [Tiotropium Bromide Monohydrate]     Increased cough  . Symbicort [Budesonide-Formoterol Fumarate] Cough    Increased cough    Patient Measurements: Height: 5' (152.4 cm) Weight: 101 lb 1.6 oz (45.9 kg)(scale c ) IBW/kg (Calculated) : 45.5   Vital Signs: Temp: 98 F (36.7 C) (02/11 0400) Temp Source: Oral (02/11 0400) BP: 154/62 (02/11 0400) Pulse Rate: 68 (02/11 0400) Intake/Output from previous day: 02/10 0701 - 02/11 0700 In: 940 [P.O.:940] Out: 1825 [Urine:1825] Intake/Output from this shift: Total I/O In: -  Out: 450 [Urine:450]  Labs: Recent Labs    10/26/17 1937 10/27/17 0620 10/28/17 0611 10/29/17 0552  WBC 10.3  --  9.0 9.5  HGB 8.1* 7.9* 7.6* 7.8*  HCT 27.5* 26.4* 25.8* 26.5*  PLT 260  --  268 282  CREATININE 1.30* 1.29*  --  1.38*  ALBUMIN 3.9  --   --   --   PROT 7.2  --   --   --   AST 36  --   --   --   ALT 41  --   --   --   ALKPHOS 64  --   --   --   BILITOT 0.5  --   --   --    Estimated Creatinine Clearance: 22.2 mL/min (A) (by C-G formula based on SCr of 1.38 mg/dL (H)).  Lab Results  Component Value Date   IRON 17 (L) 10/27/2017   TIBC 410 10/27/2017   FERRITIN 45 10/27/2017  Tsat 4  Microbiology: No results found for this or any previous visit (from the past 720 hour(s)).  Medications:  Scheduled:  . amLODipine  10 mg Oral Daily  . aspirin EC  81 mg Oral QHS  . azithromycin  500 mg Oral Daily  . budesonide (PULMICORT) nebulizer solution  0.25 mg  Nebulization BID  . buPROPion  150 mg Oral Daily  . cholecalciferol  1,000 Units Oral QHS  . furosemide  20 mg Oral QODAY  . insulin aspart  0-5 Units Subcutaneous QHS  . insulin aspart  0-9 Units Subcutaneous TID WC  . insulin glargine  10 Units Subcutaneous Q24H  . ipratropium-albuterol  3 mL Nebulization QID  . irbesartan  150 mg Oral Daily  . magnesium oxide  400 mg Oral Daily  . mouth rinse  15 mL Mouth Rinse BID  . pantoprazole  40 mg Oral BID  . pravastatin  40 mg Oral QPM  . predniSONE  40 mg Oral Q breakfast  . sertraline  100 mg Oral q morning - 10a  . sodium chloride flush  3 mL Intravenous Q12H  . sodium chloride flush  3 mL Intravenous Q12H    Assessment: 84 yo female admitted with anemia, likely due to GI loss.  Pharmacy asked to replace iron with IV iron.  Tsat = 4%, Iron = 17  Total iron deficit calculated ~ 1g  Goal of  Therapy:  Desired Hgb ~ 12  Plan:  Iron dextran 25 mg IV test dose now, if tolerated will give total 1g dose. Could consider Feraheme, but can only give 510 mg every 3 days.  Iron dextran preferable so she can get entire dose at one time.  Uvaldo Rising, BCPS  Clinical Pharmacist Pager [phone]  10/29/2017 8:56 AM

## 2017-10-30 ENCOUNTER — Telehealth: Payer: Self-pay | Admitting: Acute Care

## 2017-10-30 ENCOUNTER — Ambulatory Visit: Payer: Medicare Other

## 2017-10-30 NOTE — Telephone Encounter (Signed)
Called and spoke with patient Pt had no additional questions Hospital f/u appt has been scheduled with SG Nothing further needed at this time

## 2017-11-01 ENCOUNTER — Ambulatory Visit: Payer: Medicare Other | Admitting: Physical Therapy

## 2017-11-07 ENCOUNTER — Ambulatory Visit: Payer: Medicare Other

## 2017-11-07 ENCOUNTER — Ambulatory Visit: Payer: Medicare Other | Admitting: Acute Care

## 2017-11-12 ENCOUNTER — Telehealth: Payer: Self-pay | Admitting: Acute Care

## 2017-11-12 NOTE — Telephone Encounter (Signed)
If the patient is sick, it is best to do the PFT's when she is  well. She can keep the appointment if she needs to be seen acutely for symptom management, but she should re-schedule the PFT's until she is well.If she does not need an appointment for symptom management, she can cancel both and re-schedule both when well.Thanks

## 2017-11-12 NOTE — Telephone Encounter (Signed)
Called pt letting her know we could cancel her PFT and have her do it after she is better and she could still keep her OV with Eric Form to address her symptoms at that visit.  Pt stated she still wants to come in for the OV.  I expressed understanding. PFT appt cancelled for pt.  Nothing further needed at this current time.

## 2017-11-12 NOTE — Telephone Encounter (Signed)
Called and spoke with pt who states she still has a lot of congestion and is wanting to know if it would be fine for her to come 11/14/17 and have the PFT followed by the OV with Eric Form, NP or if she should reschedule these visits.  Stated to pt I would check with Judson Roch to see what she says regarding this and we would get back in touch with her once we have an answer for her.  Sarah, please advise on this for pt. Thanks!

## 2017-11-14 ENCOUNTER — Encounter: Payer: Self-pay | Admitting: Acute Care

## 2017-11-14 ENCOUNTER — Ambulatory Visit (INDEPENDENT_AMBULATORY_CARE_PROVIDER_SITE_OTHER): Payer: Medicare Other | Admitting: Acute Care

## 2017-11-14 ENCOUNTER — Ambulatory Visit (INDEPENDENT_AMBULATORY_CARE_PROVIDER_SITE_OTHER)
Admission: RE | Admit: 2017-11-14 | Discharge: 2017-11-14 | Disposition: A | Discharge status: Home / Self Care | Payer: Medicare Other | Source: Ambulatory Visit | Attending: Acute Care | Admitting: Acute Care

## 2017-11-14 VITALS — BP 118/58 | HR 83 | Ht 60.0 in | Wt 98.8 lb

## 2017-11-14 DIAGNOSIS — J9621 Acute and chronic respiratory failure with hypoxia: Secondary | ICD-10-CM | POA: Diagnosis not present

## 2017-11-14 DIAGNOSIS — J441 Chronic obstructive pulmonary disease with (acute) exacerbation: Secondary | ICD-10-CM | POA: Diagnosis not present

## 2017-11-14 MED ORDER — PREDNISONE 10 MG PO TABS: ORAL_TABLET | ORAL | 0 refills | Status: DC

## 2017-11-14 MED ORDER — DOXYCYCLINE HYCLATE 100 MG PO TABS: 100.0000 mg | ORAL_TABLET | Freq: Two times a day (BID) | ORAL | 0 refills | Status: DC

## 2017-11-14 NOTE — Patient Instructions (Addendum)
CXR today. We will call you with results. Doxycycline 100 mg twice  daily x 7 days Prednisone taper; 10 mg tablets: 4 tabs x 2 days, 3 tabs x 2 days, 2 tabs x 2 days 1 tab x 2 days then stop. Mucinex 600 mg for chest congestion once daily with a full glass of water. Flutter valve 4 blows 4 times a day. Continue oxygen at 2L [place]  Saturation goals are 88-92% Continue Flovent 2 puffs twice daily. Continue Pro Air as needed for breakthrough shortness of breath We will postpone CT chest scheduled for 11/19/2017 at 9:30 x 2 weeks. Please reschedule to 12/03/2017. We will call you with the time. Note your daily symptoms > remember "red flags" for COPD:  Increase in cough, increase in sputum production, increase in shortness of breath or activity tolerance. If you notice these symptoms, please call to be seen.  Follow up in 2 weeks with Judson Roch or Dr. Chase Caller. Please contact office for sooner follow up if symptoms do not improve or worsen or seek emergency care

## 2017-11-14 NOTE — Assessment & Plan Note (Signed)
Better after hospitalization but not rsolved New cough since discharge Plan: Doxycycline 100 mg twice  daily x 7 days Prednisone taper; 10 mg tablets: 4 tabs x 2 days, 3 tabs x 2 days, 2 tabs x 2 days 1 tab x 2 days then stop. Mucinex 600 mg for chest congestion once daily with a full glass of water. Flutter valve 4 blows 4 times a day. Continue oxygen at 2L [place]  Saturation goals are 88-92% Continue Flovent 2 puffs twice daily. Continue Pro Air as needed for breakthrough shortness of breath We will postpone CT chest scheduled for 11/19/2017 at 9:30 x 2 weeks. Please reschedule to 12/03/2017. We will call you with the time. Note your daily symptoms > remember "red flags" for COPD:  Increase in cough, increase in sputum production, increase in shortness of breath or activity tolerance. If you notice these symptoms, please call to be seen.  Follow up in 2 weeks with Judson Roch or Dr. Chase Caller. Please contact office for sooner follow up if symptoms do not improve or worsen or seek emergency care

## 2017-11-14 NOTE — Assessment & Plan Note (Signed)
Continue oxygen at 2 L [place] Saturation goals are 88-92%

## 2017-11-14 NOTE — Progress Notes (Signed)
History of Present Illness [name] is a 84 y.o. female with female former smoker ( 30 pack year history, quit 1974). She is followed by Dr. Chase Caller.  Synopsis: 84 year old female with chronic COPD and chronic hypoxemic respiratory failure on 2 L oxygen. She has abnormal chest x-ray on account of pneumonia in 2017 . CT scan of the chest and a chest x-ray 09/2016 was abnormal.   11/14/2017 Hospital Follow Up: 1. Pt. Presents for hospital follow up. She was admitted 2/9-2/11 for COPD exacerbation and microcytic anemia.There is suspicion that her COPD exacerbation was caused by an acute viral bronchitis. She was Treated with azithromycin, bronchodilator nebulizations, IV Solu-Medrol, flutter valve, and she improved.  She completed 3 days of azithromycin, when it was discontinued.  She was Transitioned to oral prednisone taper at discharge, with instructions to continue prior home bronchodilator inhalers/nebulizers and Flovent.Oxygen needs were 2L after her acute bronchitis resolved. Her Microcytic anemia/ FOBT + was evaluated.Last colonoscopy by [facility] in 2013 showed diverticulosis.? Related to slow GI bleed and chronic kidney disease. Anemia panel reviewed. Suggestive of iron deficiency. Hemoglobin has dropped to 7.6. [facility] consulted for further evaluation and their input appreciated.  Patient was not keen on endoscopy evaluation and was also felt to be high risk due to her advanced COPD.  GI recommended Protonix 40 mg twice daily, outpatient follow-up with PCP and no procedures planned unless she were to have overt bleeding and/or fails to maintain her hemoglobin with iron support.  Received a dose of IV iron 2/11 prior to discharge with instructions to  continue oral iron supplements and Colace to avoid constipation.She was instructed to follow up with her PCP 4 days after discharge for CBC and BMET, and pulmonology within 1 week. She is scheduled for a follow up CT chest 11/19/2017  as follow up of pulmonary nodule. This was based on radiology wanting  to wait and re-scan in 3 months due to the fact  the nodule is in a location that makes it difficult to biopsy due to its small size.  Pt. Presents today stating she has been compliant with her prednisone taper. She completed this on Saturday 11/10/2017.She states that she started to cough again on Wednesday. She states the cough  Is productive only once in a while. She states the secretions are white to clear. She is taking over the counter cough medication and cough drops. She is using her oxygen at 2L [place] . Her sats in the office today are 94%.She states she would like to go back to pulmonary rehab. We will wait until she is over this flare.She did see her PCP and has had labs since she was discharged.She is using regular albuterol nebs twice daily.She denies fever, chest pain, orthopnea or hemoptysis.    Test Results: CXR 11/14/2017>> 1. No acute abnormality. 2. Stable changes of COPD. 3. Stable hiatal hernia.   PET 10/09/2017 1. Pleural-based spiculated nodule of concern in the left lower lobe has a maximum SUV of 5.3, compatible with malignancy. Currently no pleural effusion or findings of pleural metastatic spread, although clearly the position of this lesion may make it more risky for early pleural metastatic spread. No appreciable adenopathy. 2. The previous tiny right pulmonary nodule along the right hemidiaphragm is not well seen today, and is below sensitive PET-CT size thresholds. 3. Other imaging findings of potential clinical significance: Aortic Atherosclerosis (ICD10-I70.0) and Emphysema (ICD10-J43.9). Coronary atherosclerosis. Cholelithiasis. Prominent stool throughout the colon favors constipation. Descending and  sigmoid colon diverticulosis. Questionable wall thickening in proximal loops of small bowel, query mild proximal enteritis. Moderate-sized hiatal Hernia.  08/20/2017 CT Chest  w/o IMPRESSION: 1. New spiculated 13 x 18 mm subpleural nodule left lower lobe. While this may potentially represent a focal pneumonia, neoplasm is certainly a concern, given the reported clinical history of left rib pain. Consider follow-up chest CT in 3 months to assess for resolution. If the lesion is persistent at that time, PET-CT may be warranted to further evaluate. 2. Emphysema with bullous change in the upper lungs. 3. Enlargement main pulmonary artery suggests pulmonary arterial Hypertension.  PFT's 2016: Pulmonary Function Diagnosis: Obstructive Airways Disease- severe based on FEV1/ FVC ratio Insignificant response to bronchodilator Severe Diffusion Defect Emphysema pattern with consistent loop contour  CBC Latest Ref Rng & Units 10/29/2017 10/28/2017 10/27/2017  WBC 4.0 - 10.5 K/uL 9.5 9.0 -  Hemoglobin 12.0 - 15.0 g/dL 7.8(L) 7.6(L) 7.9(L)  Hematocrit 36.0 - 46.0 % 26.5(L) 25.8(L) 26.4(L)  Platelets 150 - 400 K/uL 282 268 -    BMP Latest Ref Rng & Units 10/29/2017 10/27/2017 10/26/2017  Glucose 65 - 99 mg/dL 111(H) 280(H) 184(H)  BUN 6 - 20 mg/dL 40(H) 34(H) 32(H)  Creatinine 0.44 - 1.00 mg/dL 1.38(H) 1.29(H) 1.30(H)  Sodium 135 - 145 mmol/L 139 138 138  Potassium 3.5 - 5.1 mmol/L 3.9 4.1 3.9  Chloride 101 - 111 mmol/L 105 106 105  CO2 22 - 32 mmol/L 22 17(L) 21(L)  Calcium 8.9 - 10.3 mg/dL 9.4 8.6(L) 9.0    BNP    Component Value Date/Time   BNP 175.9 (H) 10/27/2017 0302    ProBNP    Component Value Date/Time   PROBNP 407.0 (H) 09/10/2013 1105    PFT    Component Value Date/Time   FEV1PRE 1.19 01/18/2015 0928   FEV1POST 1.20 01/18/2015 0928   FVCPRE 2.72 01/18/2015 0928   FVCPOST 2.72 01/18/2015 0928   TLC 4.82 01/18/2015 0928   DLCOUNC 6.11 01/18/2015 0928   PREFEV1FVCRT 44 01/18/2015 0928   PSTFEV1FVCRT 44 01/18/2015 0928    Dg Chest 2 View  Result Date: 11/14/2017 CLINICAL DATA:  Cough for the past 3 weeks.  Ex-smoker. EXAM: CHEST  2 VIEW  COMPARISON:  10/27/2017. FINDINGS: Normal sized heart. Stable minimal scarring at both lung bases. The lungs remain hyperexpanded. Stable post CABG changes and prosthetic aortic valve. Mild thoracic spine degenerative changes. Small to moderate-sized hiatal hernia. IMPRESSION: 1. No acute abnormality. 2. Stable changes of COPD. 3. Stable hiatal hernia. Electronically Signed   By: Claudie Revering M.D.   On: 11/14/2017 19:49   Dg Chest 2 View  Result Date: 10/27/2017 CLINICAL DATA:  84 y/o  F; wheezing and cough. EXAM: CHEST  2 VIEW COMPARISON:  10/26/2017 chest radiograph.  08/20/2017 chest CT. FINDINGS: Stable normal heart size. Aortic valve replacement. Aortic atherosclerosis with calcification. Emphysema. Right greater than left lung base bronchitic changes. Diminished right lung base opacity from prior study, probably atelectasis. No new focal consolidation. No pleural effusion or pneumothorax. Post median sternotomy with wires in alignment. No acute osseous abnormality is evident. Hiatal hernia. IMPRESSION: 1. Right greater than left lung base bronchitic changes. No focal consolidation. 2. Emphysema. 3. Aortic atherosclerosis. 4. Hiatal hernia. Electronically Signed   By: Kristine Garbe M.D.   On: 10/27/2017 02:53   Dg Chest 2 View  Result Date: 10/26/2017 CLINICAL DATA:  Shortness of breath EXAM: CHEST  2 VIEW COMPARISON:  PET CT 10/09/2017 Chest radiograph 03/27/2017 FINDINGS:  Hyperinflated lungs with increased biapical lucency consistent with bullous emphysema. Increased opacity in the medial right lung base. The remote median sternotomy and prosthetic the aortic valve. No pleural effusion or pneumothorax. IMPRESSION: 1.  Emphysema (ICD10-J43.9). 2. Increased opacity in the medial right lung base could indicate developing pneumonia. Electronically Signed   By: Ulyses Jarred M.D.   On: 10/26/2017 16:26   Ct Head Wo Contrast  Result Date: 10/16/2017 CLINICAL DATA:  Ct head/cspine wo, Pt fell  backwards in chair and hit the back of her head. Pt denies LOC, reports headache, no neck pain EXAM: CT HEAD WITHOUT CONTRAST CT CERVICAL SPINE WITHOUT CONTRAST TECHNIQUE: Multidetector CT imaging of the head and cervical spine was performed following the standard protocol without intravenous contrast. Multiplanar CT image reconstructions of the cervical spine were also generated. COMPARISON:  None. FINDINGS: CT HEAD FINDINGS Brain: No intracranial hemorrhage. No parenchymal contusion. No midline shift or mass effect. Basilar cisterns are patent. No skull base fracture. No fluid in the paranasal sinuses or mastoid air cells. Orbits are normal. There are periventricular and subcortical white matter hypodensities. Generalized cortical atrophy. Vascular: No hyperdense vessel or unexpected calcification. Skull: Normal. Negative for fracture or focal lesion. Sinuses/Orbits: Paranasal sinuses and mastoid air cells are clear. Orbits are clear. Other: None. CT CERVICAL SPINE FINDINGS Alignment: Normal alignment of the cervical vertebral bodies. Skull base and vertebrae: Normal craniocervical junction. No loss of vertebral body height or disc height. Normal facet articulation. No evidence of fracture. Soft tissues and spinal canal: No prevertebral soft tissue swelling. No perispinal or epidural hematoma. Disc levels:  Unremarkable Upper chest: Clear Other: None IMPRESSION: 1. No intracranial trauma. 2. Mild atrophy white matter microvascular disease. 3. No cervical spine fracture. Electronically Signed   By: Suzy Bouchard M.D.   On: 10/16/2017 15:33   Ct Cervical Spine Wo Contrast  Result Date: 10/16/2017 CLINICAL DATA:  Ct head/cspine wo, Pt fell backwards in chair and hit the back of her head. Pt denies LOC, reports headache, no neck pain EXAM: CT HEAD WITHOUT CONTRAST CT CERVICAL SPINE WITHOUT CONTRAST TECHNIQUE: Multidetector CT imaging of the head and cervical spine was performed following the standard protocol  without intravenous contrast. Multiplanar CT image reconstructions of the cervical spine were also generated. COMPARISON:  None. FINDINGS: CT HEAD FINDINGS Brain: No intracranial hemorrhage. No parenchymal contusion. No midline shift or mass effect. Basilar cisterns are patent. No skull base fracture. No fluid in the paranasal sinuses or mastoid air cells. Orbits are normal. There are periventricular and subcortical white matter hypodensities. Generalized cortical atrophy. Vascular: No hyperdense vessel or unexpected calcification. Skull: Normal. Negative for fracture or focal lesion. Sinuses/Orbits: Paranasal sinuses and mastoid air cells are clear. Orbits are clear. Other: None. CT CERVICAL SPINE FINDINGS Alignment: Normal alignment of the cervical vertebral bodies. Skull base and vertebrae: Normal craniocervical junction. No loss of vertebral body height or disc height. Normal facet articulation. No evidence of fracture. Soft tissues and spinal canal: No prevertebral soft tissue swelling. No perispinal or epidural hematoma. Disc levels:  Unremarkable Upper chest: Clear Other: None IMPRESSION: 1. No intracranial trauma. 2. Mild atrophy white matter microvascular disease. 3. No cervical spine fracture. Electronically Signed   By: Suzy Bouchard M.D.   On: 10/16/2017 15:33     Past medical hx Past Medical History:  Diagnosis Date  . Anxiety   . Aortic stenosis   . Arthritis   . Asthma    uses inhaler as needed  . Depression   .  Diabetes mellitus   . Emphysema of lung ([place])   . GERD (gastroesophageal reflux disease)   . Glaucoma   . Hyperlipidemia   . Hypertension   . [name]   . Murmur   . Nephrolithiasis      Social History   Tobacco Use  . Smoking status: Former Smoker    Packs/day: 2.00    Years: 15.00    Pack years: 30.00    Types: Cigarettes    Last attempt to quit: 09/18/1972    Years since quitting: 45.1  . Smokeless tobacco: Never Used  Substance Use  Topics  . Alcohol use: No    Alcohol/week: 0.0 oz  . Drug use: No    Ms.Ruehl reports that she quit smoking about 45 years ago. Her smoking use included cigarettes. She has a 30.00 pack-year smoking history. she has never used smokeless tobacco. She reports that she does not drink alcohol or use drugs.  Tobacco Cessation: Former smoker  With a 30 pack year smoking history. Quit 1974.  Past surgical hx, Family hx, Social hx all reviewed.  Current Outpatient Medications on File Prior to Visit  Medication Sig  . acetaminophen (TYLENOL) 500 MG tablet Take 500 mg by mouth every 6 (six) hours as needed for headache.  . albuterol (PROVENTIL) (2.5 MG/3ML) 0.083% nebulizer solution Take 3 mLs (2.5 mg total) by nebulization every 6 (six) hours as needed for wheezing.  [name] amLODipine (NORVASC) 10 MG tablet Take 1 tablet (10 mg total) by mouth daily.  [name] aspirin EC 81 MG tablet Take 81 mg by mouth at bedtime.  [name] buPROPion (WELLBUTRIN XL) 150 MG 24 hr tablet Take 150 mg by mouth daily.  . cholecalciferol (VITAMIN D) 1000 units tablet Take 1,000 Units by mouth at bedtime.   . docusate sodium (COLACE) 100 MG capsule Take 1 capsule (100 mg total) by mouth 2 (two) times daily.  . ferrous sulfate 325 (65 FE) MG tablet Take 1 tablet (325 mg total) by mouth 2 (two) times daily with a meal.  . fluticasone (FLOVENT HFA) 220 MCG/ACT inhaler Inhale 2 puffs into the lungs 2 (two) times daily.  . furosemide (LASIX) 40 MG tablet Take 0.5 tablets (20 mg total) by mouth every other day.  [name] glimepiride (AMARYL) 1 MG tablet Take 1 mg by mouth daily with breakfast.   . guaifenesin (ROBITUSSIN) 100 MG/5ML syrup Take 5 mLs by mouth 4 (four) times daily as needed for cough or congestion. Liquid Mucinex  . ipratropium-albuterol (DUONEB) 0.5-2.5 (3) MG/3ML SOLN Take 3 mLs by nebulization 4 (four) times daily. Dx: J44.9  . irbesartan (AVAPRO) 150 MG tablet Take 1 tablet (150 mg total) by mouth daily.  . Magnesium 250 MG TABS  Take 250 mg by mouth at bedtime.   . Multiple Vitamins-Minerals (OCUVITE-LUTEIN PO) Take 1 tablet by mouth every morning.   . pantoprazole (PROTONIX) 40 MG tablet Take 1 tablet (40 mg total) by mouth 2 (two) times daily.  . pravastatin (PRAVACHOL) 40 MG tablet Take 40 mg by mouth every evening.   [name] PROAIR HFA 108 (90 BASE) MCG/ACT inhaler Inhale 2 puffs into the lungs 4 (four) times daily as needed for wheezing or shortness of breath.   . sertraline (ZOLOFT) 100 MG tablet Take 1 tablet (100 mg total) by mouth every morning.   No current facility-administered medications on file prior to visit.      Allergies  Allergen Reactions  . Lipitor [Atorvastatin] Other (See Comments)  myalgias myalgias  . Lisinopril Other (See Comments)    cough cough  . Amaryl [Glimepiride]     Side effects  . Avelox [Moxifloxacin Hcl In Nacl]     nausea  . Ciprofloxacin     nausea  . Other Other (See Comments)    Increased cough  . Prevacid [Lansoprazole]     diarrhea  . Spiriva Handihaler [Tiotropium Bromide Monohydrate]     Increased cough  . Symbicort [Budesonide-Formoterol Fumarate] Cough    Increased cough    Review Of Systems:  Constitutional:   No  weight loss, night sweats,  Fevers, chills, fatigue, or  lassitude.  HEENT:   No headaches,  Difficulty swallowing,  Tooth/dental problems, or  Sore throat,                No sneezing, itching, ear ache, nasal congestion, post nasal drip,   CV:  No chest pain,  Orthopnea, PND, swelling in lower extremities, anasarca, dizziness, palpitations, syncope.   GI  No heartburn, indigestion, abdominal pain, nausea, vomiting, diarrhea, change in bowel habits, loss of appetite, bloody stools.   Resp: + baseline  shortness of breath with exertion less at rest.  + excess mucus, + productive cough,  + non-productive cough,  No coughing up of blood.  No change in color of mucus.  + wheezing.  No chest wall deformity  Skin: no rash or lesions.  GU: no  dysuria, change in color of urine, no urgency or frequency.  No flank pain, no hematuria   MS:  No joint pain or swelling.  No decreased range of motion.  No back pain.  Psych:  No change in mood or affect. No depression or anxiety.  No memory loss.   Vital Signs BP (!) 118/58 (BP Location: Left Arm, Cuff Size: Normal)   Pulse 83   Ht 5' (1.524 m)   Wt 98 lb 12.8 oz (44.8 kg)   SpO2 94%   BMI 19.30 kg/m    Physical Exam:  General- No distress,  A&Ox3, pleasant ENT: No sinus tenderness, TM clear, pale nasal mucosa, no oral exudate,no post nasal drip, no LAN Cardiac: S1, S2, regular rate and rhythm, no murmur Chest: + wheeze/ no rales/ dullness; + accessory muscle use, no nasal flaring, no sternal retractions Abd.: Soft Non-tender, non-distended, BS + Ext: No clubbing cyanosis, edema Neuro:  Deconditioned at baseline, MAE x 4, A&O x 3 Skin: No rashes, warm and dry Psych: normal mood and behavior   Assessment/Plan  COPD exacerbation (HCC) Better after hospitalization but not rsolved New cough since discharge Plan: Doxycycline 100 mg twice  daily x 7 days Prednisone taper; 10 mg tablets: 4 tabs x 2 days, 3 tabs x 2 days, 2 tabs x 2 days 1 tab x 2 days then stop. Mucinex 600 mg for chest congestion once daily with a full glass of water. Flutter valve 4 blows 4 times a day. Continue oxygen at 2L [place]  Saturation goals are 88-92% Continue Flovent 2 puffs twice daily. Continue Pro Air as needed for breakthrough shortness of breath We will postpone CT chest scheduled for 11/19/2017 at 9:30 x 2 weeks. Please reschedule to 12/03/2017. We will call you with the time. Note your daily symptoms > remember "red flags" for COPD:  Increase in cough, increase in sputum production, increase in shortness of breath or activity tolerance. If you notice these symptoms, please call to be seen.  Follow up in 2 weeks with Judson Roch or Dr. Chase Caller.  Please contact office for sooner follow up if symptoms  do not improve or worsen or seek emergency care     Acute on chronic respiratory failure with hypoxia (HCC) Continue oxygen at 2 L [place] Saturation goals are 88-92%    Magdalen Spatz, NP 11/14/2017  8:21 PM

## 2017-11-15 ENCOUNTER — Ambulatory Visit: Payer: Medicare Other

## 2017-11-19 ENCOUNTER — Inpatient Hospital Stay: Admission: RE | Admit: 2017-11-19 | Payer: Medicare Other | Source: Ambulatory Visit

## 2017-11-29 ENCOUNTER — Ambulatory Visit: Payer: Medicare Other | Admitting: Acute Care

## 2017-11-30 ENCOUNTER — Ambulatory Visit: Payer: Medicare Other | Admitting: Podiatry

## 2017-12-03 ENCOUNTER — Ambulatory Visit (INDEPENDENT_AMBULATORY_CARE_PROVIDER_SITE_OTHER): Payer: Medicare Other | Admitting: Acute Care

## 2017-12-03 ENCOUNTER — Encounter: Payer: Self-pay | Admitting: Acute Care

## 2017-12-03 ENCOUNTER — Telehealth: Payer: Self-pay | Admitting: Acute Care

## 2017-12-03 ENCOUNTER — Ambulatory Visit (INDEPENDENT_AMBULATORY_CARE_PROVIDER_SITE_OTHER)
Admission: RE | Admit: 2017-12-03 | Discharge: 2017-12-03 | Disposition: A | Discharge status: Home / Self Care | Payer: Medicare Other | Source: Ambulatory Visit | Attending: Emergency Medicine | Admitting: Emergency Medicine

## 2017-12-03 DIAGNOSIS — R918 Other nonspecific abnormal finding of lung field: Secondary | ICD-10-CM | POA: Diagnosis not present

## 2017-12-03 DIAGNOSIS — J441 Chronic obstructive pulmonary disease with (acute) exacerbation: Secondary | ICD-10-CM | POA: Diagnosis not present

## 2017-12-03 DIAGNOSIS — R911 Solitary pulmonary nodule: Secondary | ICD-10-CM | POA: Insufficient documentation

## 2017-12-03 NOTE — Progress Notes (Signed)
History of Present Illness [name] is a 84 y.o. female former smoker with a 30 pack year history, quit 1974, COPD and chronic hypoxemic respiratory failure on 2 L oxygen.  . She is followed by Dr. Chase Caller.     12/03/2017  Pt presents for follow up. She was seen 11/14/2017 for hospital follow up. At that time she was having a slow to resolve COPD flare. She was retreated with Doxycycline and prednisone taper. She returns today for follow up. She completed the Doxy and prednisone taper.She states she still has a bit of a cough.She states her secretions are clear to white. She states they vary in thickness. She is compliant with her Mucinex and flutter valve. secretions are clear , she feels she is better able to get the secretions up.She states she is continuing to have a runny nose . Nasal Secretions are also clear. She is compliant with her oxygen,Flovent and Albuterol nebs as needed.She denies fever, chest pain, orthopnea or hemoptysis.She had her follow up CT Chest this am.   Test Results: CT Chest 12/03/2017 Irregular subpleural nodule over the lateral left lower lobe measuring 1.1 x 1.7 cm (previously 1.3 x 1.8 cm). Although measurements are stable, this subjectively appears slightly more nodular/bulky particularly along the medial aspect. This demonstrated mild hypermetabolic activity on previous PET-CT suggesting malignancy. Consider tissue sampling for further evaluation.  CBC Latest Ref Rng & Units 10/29/2017 10/28/2017 10/27/2017  WBC 4.0 - 10.5 K/uL 9.5 9.0 -  Hemoglobin 12.0 - 15.0 g/dL 7.8(L) 7.6(L) 7.9(L)  Hematocrit 36.0 - 46.0 % 26.5(L) 25.8(L) 26.4(L)  Platelets 150 - 400 K/uL 282 268 -    BMP Latest Ref Rng & Units 10/29/2017 10/27/2017 10/26/2017  Glucose 65 - 99 mg/dL 111(H) 280(H) 184(H)  BUN 6 - 20 mg/dL 40(H) 34(H) 32(H)  Creatinine 0.44 - 1.00 mg/dL 1.38(H) 1.29(H) 1.30(H)  Sodium 135 - 145 mmol/L 139 138 138  Potassium 3.5 - 5.1 mmol/L 3.9 4.1 3.9  Chloride 101  - 111 mmol/L 105 106 105  CO2 22 - 32 mmol/L 22 17(L) 21(L)  Calcium 8.9 - 10.3 mg/dL 9.4 8.6(L) 9.0    BNP    Component Value Date/Time   BNP 175.9 (H) 10/27/2017 0302    ProBNP    Component Value Date/Time   PROBNP 407.0 (H) 09/10/2013 1105    PFT    Component Value Date/Time   FEV1PRE 1.19 01/18/2015 0928   FEV1POST 1.20 01/18/2015 0928   FVCPRE 2.72 01/18/2015 0928   FVCPOST 2.72 01/18/2015 0928   TLC 4.82 01/18/2015 0928   DLCOUNC 6.11 01/18/2015 0928   PREFEV1FVCRT 44 01/18/2015 0928   PSTFEV1FVCRT 44 01/18/2015 0928    Dg Chest 2 View  Result Date: 11/14/2017 CLINICAL DATA:  Cough for the past 3 weeks.  Ex-smoker. EXAM: CHEST  2 VIEW COMPARISON:  10/27/2017. FINDINGS: Normal sized heart. Stable minimal scarring at both lung bases. The lungs remain hyperexpanded. Stable post CABG changes and prosthetic aortic valve. Mild thoracic spine degenerative changes. Small to moderate-sized hiatal hernia. IMPRESSION: 1. No acute abnormality. 2. Stable changes of COPD. 3. Stable hiatal hernia. Electronically Signed   By: Claudie Revering M.D.   On: 11/14/2017 19:49     Past medical hx Past Medical History:  Diagnosis Date  . Anxiety   . Aortic stenosis   . Arthritis   . Asthma    uses inhaler as needed  . Depression   . Diabetes mellitus   . Emphysema of  lung ([place])   . GERD (gastroesophageal reflux disease)   . Glaucoma   . Hyperlipidemia   . Hypertension   . [name]   . Murmur   . Nephrolithiasis      Social History   Tobacco Use  . Smoking status: Former Smoker    Packs/day: 2.00    Years: 15.00    Pack years: 30.00    Types: Cigarettes    Last attempt to quit: 09/18/1972    Years since quitting: 45.2  . Smokeless tobacco: Never Used  Substance Use Topics  . Alcohol use: No    Alcohol/week: 0.0 oz  . Drug use: No    [name] reports that she quit smoking about 45 years ago. Her smoking use included cigarettes. She has a 30.00  pack-year smoking history. she has never used smokeless tobacco. She reports that she does not drink alcohol or use drugs.  Tobacco Cessation: Former smoker with a 30 pack year smoking history. She quit 45 years ago.  Past surgical hx, Family hx, Social hx all reviewed.  Current Outpatient Medications on File Prior to Visit  Medication Sig  . acetaminophen (TYLENOL) 500 MG tablet Take 500 mg by mouth every 6 (six) hours as needed for headache.  . albuterol (PROVENTIL) (2.5 MG/3ML) 0.083% nebulizer solution Take 3 mLs (2.5 mg total) by nebulization every 6 (six) hours as needed for wheezing.  [name] amLODipine (NORVASC) 10 MG tablet Take 1 tablet (10 mg total) by mouth daily.  [name] aspirin EC 81 MG tablet Take 81 mg by mouth at bedtime.  [name] buPROPion (WELLBUTRIN XL) 150 MG 24 hr tablet Take 150 mg by mouth daily.  . cholecalciferol (VITAMIN D) 1000 units tablet Take 1,000 Units by mouth at bedtime.   . fluticasone (FLOVENT HFA) 220 MCG/ACT inhaler Inhale 2 puffs into the lungs 2 (two) times daily.  . furosemide (LASIX) 40 MG tablet Take 0.5 tablets (20 mg total) by mouth every other day.  [name] glimepiride (AMARYL) 1 MG tablet Take 1 mg by mouth daily with breakfast.   . ipratropium-albuterol (DUONEB) 0.5-2.5 (3) MG/3ML SOLN Take 3 mLs by nebulization 4 (four) times daily. Dx: J44.9  . irbesartan (AVAPRO) 150 MG tablet Take 1 tablet (150 mg total) by mouth daily.  . Magnesium 250 MG TABS Take 250 mg by mouth at bedtime.   . Multiple Vitamins-Minerals (OCUVITE-LUTEIN PO) Take 1 tablet by mouth every morning.   . pravastatin (PRAVACHOL) 40 MG tablet Take 40 mg by mouth every evening.   [name] PROAIR HFA 108 (90 BASE) MCG/ACT inhaler Inhale 2 puffs into the lungs 4 (four) times daily as needed for wheezing or shortness of breath.   . sertraline (ZOLOFT) 100 MG tablet Take 1 tablet (100 mg total) by mouth every morning.   No current facility-administered medications on file prior to visit.      Allergies    Allergen Reactions  . Lipitor [Atorvastatin] Other (See Comments)    myalgias myalgias  . Lisinopril Other (See Comments)    cough cough  . Amaryl [Glimepiride]     Side effects  . Avelox [Moxifloxacin Hcl In Nacl]     nausea  . Ciprofloxacin     nausea  . Other Other (See Comments)    Increased cough  . Prevacid [Lansoprazole]     diarrhea  . Spiriva Handihaler [Tiotropium Bromide Monohydrate]     Increased cough  . Symbicort [Budesonide-Formoterol Fumarate] Cough    Increased cough  Review Of Systems:  Constitutional:   No  weight loss, night sweats,  Fevers, chills, fatigue, or  lassitude.  HEENT:   No headaches,  Difficulty swallowing,  Tooth/dental problems, or  Sore throat,                No sneezing, itching, ear ache, nasal congestion, +post nasal drip,   CV:  No chest pain,  Orthopnea, PND, swelling in lower extremities, anasarca, dizziness, palpitations, syncope.   GI  No heartburn, indigestion, abdominal pain, nausea, vomiting, diarrhea, change in bowel habits, loss of appetite, bloody stools.   Resp: + baseline  shortness of breath with exertion or at rest.  + excess mucus, no productive cough,  + non-productive cough,  No coughing up of blood.  No change in color of mucus.  No wheezing.  No chest wall deformity  Skin: no rash or lesions.  GU: no dysuria, change in color of urine, no urgency or frequency.  No flank pain, no hematuria   MS:  No joint pain or swelling.  No decreased range of motion.  No back pain.  Psych:  No change in mood or affect. No depression or anxiety.  No memory loss.   Vital Signs BP 130/70 (BP Location: Left Arm, Cuff Size: Normal)   Pulse 70   Ht 5' (1.524 m)   Wt 104 lb 6.4 oz (47.4 kg)   SpO2 94%   BMI 20.39 kg/m    Physical Exam:  General- No distress,  A&Ox3, pleasant ENT: No sinus tenderness, TM clear, pale nasal mucosa, no oral exudate,no post nasal drip, no LAN Cardiac: S1, S2, regular rate and rhythm, no  murmur Chest: No wheeze/ rales/ dullness; no accessory muscle use, no nasal flaring, no sternal retractions, diminished per bases. Abd.: Soft Non-tender, ND Ext: No clubbing cyanosis, edema Neuro:  Deconditioned at baseline Skin: No rashes, warm and dry Psych: normal mood and behavior   Assessment/Plan  No problem-specific Assessment & Plan notes found for this encounter.    Magdalen Spatz, NP 12/03/2017  11:01 AM

## 2017-12-03 NOTE — Assessment & Plan Note (Signed)
Resolving Plan Continue Mucinex 600 mg for chest congestion once daily with a full glass of water. Continue Flutter valve 4 blows 4 times a day. Continue oxygen at 2L [place]  Saturation goals are 88-92% Continue Flovent 2 puffs twice daily. Rinse mouth after use Continue Pro Air/ Albuterol nebs  as needed for breakthrough shortness of breath Add claritin of Allegra once daily. ( Buy the generic brand) We will call you with your chest CT results. Note your daily symptoms >remember "red flags" for COPD: Increase in cough, increase in sputum production, increase in shortness of breath or activity tolerance. If you notice these symptoms, please call to be seen.  Please get Colace stool softner  over the counter  Follow up in 2 months  with Judson Roch or Dr. Chase Caller. Please contact office for sooner follow up if symptoms do not improve or worsen or seek emergency care

## 2017-12-03 NOTE — Assessment & Plan Note (Signed)
Continued concern for malignancy Plan: Present to Thoracic Conference 3/21>> Biopsy Schedule PFT's  Consider Radiation without definitive tissue sampling Consider Onc Immune Blood Test

## 2017-12-03 NOTE — Patient Instructions (Addendum)
Continue Mucinex 600 mg for chest congestion once daily with a full glass of water. Continue Flutter valve 4 blows 4 times a day. Continue oxygen at 2L [place]  Saturation goals are 88-92% Continue Flovent 2 puffs twice daily. Rinse mouth after use Continue Pro Air/ Albuterol nebs  as needed for breakthrough shortness of breath Add claritin of Allegra once daily. ( Buy the generic brand) We will call you with your chest CT results. Note your daily symptoms >remember "red flags" for COPD: Increase in cough, increase in sputum production, increase in shortness of breath or activity tolerance. If you notice these symptoms, please call to be seen.  Please get Colace stool softner  over the counter  Follow up in 2 months  with [name] or [name]. Please contact office for sooner follow up if symptoms do not improve or worsen or seek emergency care

## 2017-12-03 NOTE — Telephone Encounter (Signed)
Please get patient scheduled for PFT's as soon as possible. The order has been placed. Please call her and let her know her CT Scan continues to show the area in her left lung that is concerning.Let her know I am going to present her at the Thoracic Conference this week to evaluate the best plan of care considering her COPD. Thanks so much.

## 2017-12-04 ENCOUNTER — Telehealth: Payer: Self-pay | Admitting: Internal Medicine

## 2017-12-04 NOTE — Telephone Encounter (Signed)
[name]  I just got off phone with Dr Catha Brow of IR 12/04/2017 9:21 AM - he said IR guided CT guided TTNA bx of LLL mass is doable but worries about risk (as did dr Hulen Skains of IR few months ago). So he recommends  1. Discuss at [facility] - to group - first clinical status of patient, age - emphysema, o2, and whatever PFT he has. IF then IR decides again NO GO  2. Discuss with rad onc in [place] about empiric XRT - I will send DR Tammi Klippel this phone note. In my view given very high prob for NSCLC I think perfectly fine to do empiric XRT esp if biopsy risk is high. I have had several patients go through empiric XRT without problems  THanks  Dr. Brand Males, M.D., [facility].C.P Pulmonary and Critical Care Medicine Staff Physician, [place] Director - Interstitial Lung Disease  Program  Pulmonary [place] at [place], [place], [address]  Pager: [phone], If no answer or between  15:00h - 7:00h: call [phone] Telephone: [phone]

## 2017-12-05 NOTE — Telephone Encounter (Signed)
Pt is calling back [phone]

## 2017-12-05 NOTE — Telephone Encounter (Signed)
Spoke with pt and advised message from [place]. We scheduled PFT on the same day as her appt with MR on 02/06/2018. Nothing further is needed.

## 2017-12-05 NOTE — Telephone Encounter (Signed)
Attempted to call pt but no answer.   Left message for pt to return our call x1 

## 2017-12-06 ENCOUNTER — Telehealth: Payer: Self-pay | Admitting: Acute Care

## 2017-12-06 ENCOUNTER — Encounter: Payer: Self-pay | Admitting: *Deleted

## 2017-12-06 ENCOUNTER — Other Ambulatory Visit: Payer: Self-pay | Admitting: *Deleted

## 2017-12-06 DIAGNOSIS — R911 Solitary pulmonary nodule: Secondary | ICD-10-CM

## 2017-12-06 NOTE — Telephone Encounter (Signed)
I have returned the call to [name]. I explained that on the repeat CT chest the left lower lobe nodule has not gotten any bigger, however the solid component to the nodule had groWN .  I explained that there was a lot of concern about the next step which involved biopsy and tissue sampling.  We discussed the fact that due to her poor pulmonary status such a procedure would be challenging and have some risk.  I explained that we discussed her scan results today in the thoracic conference with oncology, thoracic surgery, interventional radiology, and radiation oncology.  Taking into consideration her pulmonary status, the best option at this point is for referral to radiation oncology to evaluate the patient for radiation treatment without tissue sampling.  [name] verbalized understanding of the above.  I explained that she should receive a call based on the referral that was placed earlier today by Norton Blizzard.  I asked her to please call the office if she had not received a phone call in the next day or so.  The patient verbalized understanding of the above, and had no further questions at completion of the call.  She does have contact information.

## 2017-12-06 NOTE — Telephone Encounter (Signed)
I attempted to call [name] again this afternoon. She did not answer her phone. I have left a message requesting she return a call to the office for the CT results and plan of care moving forward. I left all contact information.

## 2017-12-06 NOTE — Telephone Encounter (Signed)
Pt is returning call. Cb is [phone]

## 2017-12-06 NOTE — Progress Notes (Signed)
Per Eric Form NP, referral completed to Rad Onc. Updated rad onc scheduling to call tomorrow so Judson Roch can call and update patient.

## 2017-12-06 NOTE — Telephone Encounter (Signed)
I called the patient this am to speak with her about her CT Chest. There was no answer.I have left a message requesting that she return my call so we can discuss her CT results and plan of care. I gave her the office contact number.Plan is for referral to radiation therapy  , Dr. Sondra Come , for the area in her lower left lobe.

## 2017-12-07 ENCOUNTER — Encounter: Payer: Self-pay | Admitting: Radiation Oncology

## 2017-12-07 NOTE — Progress Notes (Signed)
Thoracic Location of Tumor / Histology: lateral left lower lobe measuring 1.1 x 1.7 cm   Patient presented for a chest x ray and the nodule was found.  Tobacco/Marijuana/Snuff/ETOH use: former smoker. Quit in 1974.  Smoked 2 ppd for 15 years. Denies ETOH use.  Past/Anticipated interventions by cardiothoracic surgery, if any: none due to poor pulmonary status.  Past/Anticipated interventions by medical oncology, if any: no  Signs/Symptoms  Weight changes, if any: yes - has lost 40 lbs in last 2 years.  Respiratory complaints, if any: she said she is getting over bronchitis and still coughing with clear/white sputum.  She is short of breath and uses 2 L of oxygen.  Hemoptysis, if any: no  Pain issues, if any:  no  SAFETY ISSUES:  Prior radiation? no  Pacemaker/ICD? no - does have aortic valve replacement (pig valve)  Possible current pregnancy?no  Is the patient on methotrexate? no  Current Complaints / other details:  Patient is here with her husband.  BP (!) 159/77 (BP Location: Left Wrist, Patient Position: Sitting)   Pulse 70   Temp 97.8 F (36.6 C) (Oral)   Ht 5' (1.524 m)   Wt 101 lb (45.8 kg)   SpO2 98%   BMI 19.73 kg/m    Wt Readings from Last 3 Encounters:  12/12/17 101 lb (45.8 kg)  12/03/17 104 lb 6.4 oz (47.4 kg)  11/14/17 98 lb 12.8 oz (44.8 kg)

## 2017-12-11 NOTE — Progress Notes (Signed)
HPI: FU aortic stenosis/s/p AVR and CAD. Patient underwent cardiac catheterization in October of 2014. She was found to have a 70% LAD and there was a 40-50% mid RCA. In October of 2014 the patient underwent aortic valve replacement with a pericardial valve and coronary artery bypassing graft with a LIMA to the LAD. Echo 2/17 showed normal LV function, bioprosthetic aortic valve with trace AI.   Carotid Dopplers February 2018 showed 1-39% bilateral stenosis.  Echocardiogram March 2018 showed normal LV function, bioprosthetic aortic valve with mean gradient 23 mmHg.  Patient presently being evaluated for lung nodule concerning for malignancy.  Since I last saw her,  she has some dyspnea on exertion but no orthopnea, PND, pedal edema, chest pain.  She has had occasional syncope which she does not wear her oxygen.  There is no preceding chest pain, palpitations or nausea.  She is unconscious for several seconds.  Current Outpatient Medications  Medication Sig Dispense Refill  . acetaminophen (TYLENOL) 500 MG tablet Take 500 mg by mouth every 6 (six) hours as needed for headache.    . albuterol (PROVENTIL) (2.5 MG/3ML) 0.083% nebulizer solution Take 3 mLs (2.5 mg total) by nebulization every 6 (six) hours as needed for wheezing. 75 mL 0  . alendronate (FOSAMAX) 70 MG tablet Take 70 mg by mouth once a week. Take with a full glass of water on an empty stomach.    [name] amLODipine (NORVASC) 10 MG tablet Take 1 tablet (10 mg total) by mouth daily. 90 tablet 3  . aspirin EC 81 MG tablet Take 81 mg by mouth at bedtime.    [name] buPROPion (WELLBUTRIN XL) 150 MG 24 hr tablet Take 150 mg by mouth daily.  2  . cholecalciferol (VITAMIN D) 1000 units tablet Take 1,000 Units by mouth at bedtime.     Mariane Baumgarten Sodium (COLACE PO) Take by mouth 2 (two) times daily.    . Ferrous Sulfate (IRON) 325 (65 Fe) MG TABS Take by mouth 2 (two) times daily.    . fluticasone (FLOVENT HFA) 220 MCG/ACT inhaler Inhale 2 puffs into the  lungs 2 (two) times daily.    . furosemide (LASIX) 40 MG tablet Take 0.5 tablets (20 mg total) by mouth every other day.    . irbesartan (AVAPRO) 150 MG tablet Take 1 tablet (150 mg total) by mouth daily. 90 tablet 3  . Magnesium 250 MG TABS Take 250 mg by mouth at bedtime.     . Multiple Vitamins-Minerals (OCUVITE-LUTEIN PO) Take 1 tablet by mouth every morning.     . pravastatin (PRAVACHOL) 40 MG tablet Take 40 mg by mouth every evening.     [name] PROAIR HFA 108 (90 BASE) MCG/ACT inhaler Inhale 2 puffs into the lungs 4 (four) times daily as needed for wheezing or shortness of breath.     . sertraline (ZOLOFT) 100 MG tablet Take 1 tablet (100 mg total) by mouth every morning. 30 tablet 0   No current facility-administered medications for this visit.      Past Medical History:  Diagnosis Date  . Anxiety   . Aortic stenosis   . Arthritis   . Asthma    uses inhaler as needed  . Depression   . Diabetes mellitus   . Emphysema of lung ([place])   . GERD (gastroesophageal reflux disease)   . Glaucoma   . Hyperlipidemia   . Hypertension   . [name]   . Murmur   .  Nephrolithiasis   . Pneumonia 3/17 and 3/18    Past Surgical History:  Procedure Laterality Date  . ABDOMINAL HYSTERECTOMY    . ANKLE SURGERY  1998   d/t fx.  Left ankle  . ANTERIOR AND POSTERIOR REPAIR  06/21/2011   Procedure: ANTERIOR (CYSTOCELE) AND POSTERIOR REPAIR (RECTOCELE);  Surgeon: Maisie Fus;  Location: [facility];  Service: Gynecology;  Laterality: N/A;  . AORTIC VALVE REPLACEMENT N/A 07/17/2013   Procedure: AORTIC VALVE REPLACEMENT (AVR);  Surgeon: Ivin Poot, MD;  Location: [facility];  Service: Open Heart Surgery;  Laterality: N/A;  . CARDIAC CATHETERIZATION    . CARDIAC VALVE REPLACEMENT    . CORONARY ANGIOPLASTY    . CORONARY ARTERY BYPASS GRAFT N/A 07/17/2013   Procedure: CORONARY ARTERY BYPASS GRAFT, TIMES ONE, ON PUMP, USING RIGHT INTERNAL MAMMARY ARTERY.;  Surgeon: Ivin Poot, MD;   Location: [facility];  Service: Open Heart Surgery;  Laterality: N/A;  . FACIAL COSMETIC SURGERY     face lift  . INTRAOPERATIVE TRANSESOPHAGEAL ECHOCARDIOGRAM N/A 07/17/2013   Procedure: INTRAOPERATIVE TRANSESOPHAGEAL ECHOCARDIOGRAM;  Surgeon: Ivin Poot, MD;  Location: [facility];  Service: Open Heart Surgery;  Laterality: N/A;  . LAPAROSCOPIC ASSISTED VAGINAL HYSTERECTOMY  06/21/2011   Procedure: LAPAROSCOPIC ASSISTED VAGINAL HYSTERECTOMY;  Surgeon: Maisie Fus;  Location: [facility];  Service: Gynecology;  Laterality: N/A;  . LEFT AND RIGHT HEART CATHETERIZATION WITH CORONARY ANGIOGRAM N/A 06/27/2013   Procedure: LEFT AND RIGHT HEART CATHETERIZATION WITH CORONARY ANGIOGRAM;  Surgeon: Ramond Dial, MD;  Location: [facility] C[facility];  Service: Cardiovascular;  Laterality: N/A;  . NOSE SURGERY    . SALPINGOOPHORECTOMY  06/21/2011   Procedure: SALPINGO OOPHERECTOMY;  Surgeon: Maisie Fus;  Location: [facility];  Service: Gynecology;  Laterality: Bilateral;  . VAGINAL PROLAPSE REPAIR  06/21/2011   Procedure: SACROPEXY;  Surgeon: Maisie Fus;  Location: [facility];  Service: Gynecology;  Laterality: N/A;  sacrospinous ligament suspension    Social History   Socioeconomic History  . Marital status: Married    Spouse name: Jenny Reichmann  . Number of children: 1  . Years of education: Not on file  . Highest education level: Not on file  Occupational History  . Occupation: retired Producer, television/film/video: RETIRED  Social Needs  . Financial resource strain: Not on file  . Food insecurity:    Worry: Not on file    Inability: Not on file  . Transportation needs:    Medical: Not on file    Non-medical: Not on file  Tobacco Use  . Smoking status: Former Smoker    Packs/day: 2.00    Years: 15.00    Pack years: 30.00    Types: Cigarettes    Last attempt to quit: 09/18/1972    Years since quitting: 45.2  . Smokeless tobacco: Never Used  Substance and Sexual Activity  . Alcohol use: No    Alcohol/week: 0.0 oz    . Drug use: No  . Sexual activity: Never  Lifestyle  . Physical activity:    Days per week: Not on file    Minutes per session: Not on file  . Stress: Not on file  Relationships  . Social connections:    Talks on phone: Not on file    Gets together: Not on file    Attends religious service: Not on file    Active member of club or organization: Not on file    Attends meetings of clubs or  organizations: Not on file    Relationship status: Not on file  . Intimate partner violence:    Fear of current or ex partner: Not on file    Emotionally abused: Not on file    Physically abused: Not on file    Forced sexual activity: Not on file  Other Topics Concern  . Not on file  Social History Narrative  . Not on file    Family History  Problem Relation Age of Onset  . Emphysema Father   . Colon cancer Neg Hx   . Esophageal cancer Neg Hx   . Rectal cancer Neg Hx   . Stomach cancer Neg Hx     ROS: no fevers or chills, productive cough, hemoptysis, dysphasia, odynophagia, melena, hematochezia, dysuria, hematuria, rash, seizure activity, orthopnea, PND, pedal edema, claudication. Remaining systems are negative.  Physical Exam: Well-developed well-nourished in no acute distress.  Skin is warm and dry.  HEENT is normal.  Neck is supple.  Chest with diminished BS throughout Cardiovascular exam is regular rate and rhythm.  Abdominal exam nontender or distended. No masses palpated. Extremities show no edema. neuro grossly intact  Electrocardiogram for 29th shows sinus rhythm, left anterior fascicular block, right bundle branch block.  A/P  1 coronary artery disease status post coronary artery bypass and graft-patient without chest pain.  Continue aspirin and statin.  2 prior aortic valve replacement-continue SBE prophylaxis.  3 hypertension-blood pressure is controlled.  Continue present medications.  4 hyperlipidemia-continue statin.  5 carotid artery disease-continue  aspirin and statin.  Mild on most recent carotid Dopplers.  6 lung nodule-further evaluation per pulmonary.  7 syncope-several episodes occurring but only when not wearing oxygen.  We will arrange an echocardiogram to assess LV function.  We can consider a monitor in the future if she has further episodes.  Kirk Ruths, MD

## 2017-12-12 ENCOUNTER — Ambulatory Visit
Admission: RE | Admit: 2017-12-12 | Discharge: 2017-12-12 | Disposition: A | Discharge status: Home / Self Care | Payer: Medicare Other | Source: Ambulatory Visit | Attending: Radiation Oncology | Admitting: Radiation Oncology

## 2017-12-12 ENCOUNTER — Other Ambulatory Visit: Payer: Self-pay

## 2017-12-12 ENCOUNTER — Encounter: Payer: Self-pay | Admitting: General Practice

## 2017-12-12 ENCOUNTER — Encounter: Payer: Self-pay | Admitting: Radiation Oncology

## 2017-12-12 ENCOUNTER — Ambulatory Visit: Payer: Medicare Other | Admitting: Radiation Oncology

## 2017-12-12 VITALS — BP 159/77 | HR 70 | Temp 97.8°F | Ht 60.0 in | Wt 101.0 lb

## 2017-12-12 DIAGNOSIS — Z79899 Other long term (current) drug therapy: Secondary | ICD-10-CM | POA: Diagnosis not present

## 2017-12-12 DIAGNOSIS — Z881 Allergy status to other antibiotic agents status: Secondary | ICD-10-CM | POA: Diagnosis not present

## 2017-12-12 DIAGNOSIS — Z7984 Long term (current) use of oral hypoglycemic drugs: Secondary | ICD-10-CM | POA: Diagnosis not present

## 2017-12-12 DIAGNOSIS — Z9889 Other specified postprocedural states: Secondary | ICD-10-CM | POA: Diagnosis not present

## 2017-12-12 DIAGNOSIS — Z90710 Acquired absence of both cervix and uterus: Secondary | ICD-10-CM | POA: Diagnosis not present

## 2017-12-12 DIAGNOSIS — F419 Anxiety disorder, unspecified: Secondary | ICD-10-CM | POA: Insufficient documentation

## 2017-12-12 DIAGNOSIS — C3492 Malignant neoplasm of unspecified part of left bronchus or lung: Secondary | ICD-10-CM | POA: Diagnosis present

## 2017-12-12 DIAGNOSIS — F329 Major depressive disorder, single episode, unspecified: Secondary | ICD-10-CM | POA: Diagnosis not present

## 2017-12-12 DIAGNOSIS — J449 Chronic obstructive pulmonary disease, unspecified: Secondary | ICD-10-CM | POA: Diagnosis not present

## 2017-12-12 DIAGNOSIS — Z87891 Personal history of nicotine dependence: Secondary | ICD-10-CM | POA: Insufficient documentation

## 2017-12-12 DIAGNOSIS — Z888 Allergy status to other drugs, medicaments and biological substances status: Secondary | ICD-10-CM | POA: Insufficient documentation

## 2017-12-12 DIAGNOSIS — E119 Type 2 diabetes mellitus without complications: Secondary | ICD-10-CM | POA: Insufficient documentation

## 2017-12-12 DIAGNOSIS — Z952 Presence of prosthetic heart valve: Secondary | ICD-10-CM | POA: Diagnosis not present

## 2017-12-12 DIAGNOSIS — Z7982 Long term (current) use of aspirin: Secondary | ICD-10-CM | POA: Insufficient documentation

## 2017-12-12 DIAGNOSIS — R911 Solitary pulmonary nodule: Secondary | ICD-10-CM

## 2017-12-12 DIAGNOSIS — K219 Gastro-esophageal reflux disease without esophagitis: Secondary | ICD-10-CM | POA: Insufficient documentation

## 2017-12-12 DIAGNOSIS — I10 Essential (primary) hypertension: Secondary | ICD-10-CM | POA: Diagnosis not present

## 2017-12-12 HISTORY — DX: Pneumonia, unspecified organism: J18.9

## 2017-12-12 NOTE — Progress Notes (Signed)
[facility] Psychosocial Distress Screening Clinical Social Work  Clinical Social Work was referred by distress screening protocol.  The patient scored a 5 on the Psychosocial Distress Thermometer which indicates moderate distress. Clinical Social Worker Edwyna Shell to assess for distress and other psychosocial needs. CSW and patient discussed common feeling and emotions when being diagnosed with cancer, and the importance of support during treatment. CSW informed patient of the support team and support services at [facility]. CSW provided contact information and encouraged patient to call with any questions or concerns.  Patient states that today's appointments answered many of her questions and allayed her anxiety about diagnosis and treatment.  Good family support, does not anticipate needs during treatment.  Mailed information packet about Support Center and [employer], encouraged patient to access resources as needed.    ONCBCN DISTRESS SCREENING 12/12/2017  Screening Type Initial Screening  Distress experienced in past week (1-10) 5  Emotional problem type Depression;Adjusting to illness  Spiritual/Religous concerns type Relating to God  Physical Problem type Sleep/insomnia;Constipation/diarrhea;Skin dry/itchy    Clinical Social Worker follow up needed: No.  If yes, follow up plan:   Edwyna Shell, LCSW Clinical Social Worker Phone:  [phone]

## 2017-12-12 NOTE — Progress Notes (Signed)
Radiation Oncology         [phone] ________________________________  Initial Outpatient Consultation  Name: [name] MRN: [id number]  Date: 12/12/2017  DOB: 1931/12/21  CC:Harlan Stains, MD  Brand Males, MD   REFERRING PHYSICIAN: Brand Males, MD  DIAGNOSIS: Presumptive clinical stage 1 non-small cell lung cancer presenting in the left lower lung  HISTORY OF PRESENT ILLNESS::[name] is a 84 y.o. female who presented to her pulmonologist on 08/16/17 for routine assessment. She was concerned of pneumonia at this time and was scheduled for a chest CT. Chest CT on 08/20/18 showed a new spiculated 13 x 18 mm subpleural nodule in the left lower lobe. This may represent a focal pneumonia or neoplasm. Emphysema with bullous changes noted in the upper lungs. PET scan on 10/09/17 showed pleural-based spiculated nodule of concern in the left lower lobe with a maximum SUV of 5.3. This is compatible with malignancy. Currently no pleural effusion or findings of pleural metastatic spread. Repeat chest CT on 12/03/17 showed an irregular subpleural nodule over the lateral left lower lobe measuring 1.1 x 1.7 cm. Although measurements are stable, this appears slightly more nodular/ bulky, particularly along the medial aspect.   The patient's case was presented at multidisciplinary thoracic conference last week with medical oncology, surgery, interventional radiology, and radiation oncology present. Interventional radiology felt it was too risky to perform a CT guided biopsy given the patient's respiratory status and location of lesion.   The patient is being seen at the courtesy of Dr. Chase Caller to be considered for definitive treatment with SBRT for her presumed clinical stage 1 non-small cell lung cancer.   The patient complains of a 40 lb weight loss in 2 years. She reports she is getting over bronchitis and still complains of a productive cough with clear/white sputum. She is  short of breath and requires 2 L of oxygen via nasal cannula. She denies hemoptysis or pain at this time.   PREVIOUS RADIATION THERAPY: No  PAST MEDICAL HISTORY:  [name], Aortic stenosis, Arthritis, Asthma, Depression, Diabetes mellitus, Emphysema of lung ([place]), GERD (gastroesophageal reflux disease), Glaucoma, Hyperlipidemia, Hypertension, [name], Murmur, Nephrolithiasis, and Pneumonia (3/17 and 3/18).    PAST SURGICAL HISTORY: Past Surgical History:  Procedure Laterality Date  . ABDOMINAL HYSTERECTOMY    . ANKLE SURGERY  1998   d/t fx.  Left ankle  . ANTERIOR AND POSTERIOR REPAIR  06/21/2011   Procedure: ANTERIOR (CYSTOCELE) AND POSTERIOR REPAIR (RECTOCELE);  Surgeon: Maisie Fus;  Location: [facility];  Service: Gynecology;  Laterality: N/A;  . AORTIC VALVE REPLACEMENT N/A 07/17/2013   Procedure: AORTIC VALVE REPLACEMENT (AVR);  Surgeon: Ivin Poot, MD;  Location: [facility];  Service: Open Heart Surgery;  Laterality: N/A;  . CARDIAC CATHETERIZATION    . CARDIAC VALVE REPLACEMENT    . CORONARY ANGIOPLASTY    . CORONARY ARTERY BYPASS GRAFT N/A 07/17/2013   Procedure: CORONARY ARTERY BYPASS GRAFT, TIMES ONE, ON PUMP, USING RIGHT INTERNAL MAMMARY ARTERY.;  Surgeon: Ivin Poot, MD;  Location: [facility];  Service: Open Heart Surgery;  Laterality: N/A;  . FACIAL COSMETIC SURGERY     face lift  . INTRAOPERATIVE TRANSESOPHAGEAL ECHOCARDIOGRAM N/A 07/17/2013   Procedure: INTRAOPERATIVE TRANSESOPHAGEAL ECHOCARDIOGRAM;  Surgeon: Ivin Poot, MD;  Location: [facility];  Service: Open Heart Surgery;  Laterality: N/A;  . LAPAROSCOPIC ASSISTED VAGINAL HYSTERECTOMY  06/21/2011   Procedure: LAPAROSCOPIC ASSISTED VAGINAL HYSTERECTOMY;  Surgeon: Viona Gilmore  Evette Cristal;  Location: [facility];  Service: Gynecology;  Laterality: N/A;  . LEFT AND RIGHT HEART CATHETERIZATION WITH CORONARY ANGIOGRAM N/A 06/27/2013   Procedure: LEFT AND RIGHT HEART CATHETERIZATION WITH  CORONARY ANGIOGRAM;  Surgeon: Ramond Dial, MD;  Location: [facility];  Service: Cardiovascular;  Laterality: N/A;  . NOSE SURGERY    . SALPINGOOPHORECTOMY  06/21/2011   Procedure: SALPINGO OOPHERECTOMY;  Surgeon: Maisie Fus;  Location: [facility];  Service: Gynecology;  Laterality: Bilateral;  . VAGINAL PROLAPSE REPAIR  06/21/2011   Procedure: SACROPEXY;  Surgeon: Maisie Fus;  Location: [facility];  Service: Gynecology;  Laterality: N/A;  sacrospinous ligament suspension    FAMILY HISTORY: family history includes Emphysema in her father.  SOCIAL HISTORY:  reports that she quit smoking about 45 years ago. Her smoking use included cigarettes. She has a 30.00 pack-year smoking history. She has never used smokeless tobacco. She reports that she does not drink alcohol or use drugs.  ALLERGIES: Lipitor [atorvastatin]; Lisinopril; Amaryl [glimepiride]; Avelox [moxifloxacin hcl in nacl]; Ciprofloxacin; Other; Prevacid [lansoprazole]; Spiriva handihaler [tiotropium bromide monohydrate]; and Symbicort [budesonide-formoterol fumarate]  MEDICATIONS:  Current Outpatient Medications  Medication Sig Dispense Refill  . acetaminophen (TYLENOL) 500 MG tablet Take 500 mg by mouth every 6 (six) hours as needed for headache.    . albuterol (PROVENTIL) (2.5 MG/3ML) 0.083% nebulizer solution Take 3 mLs (2.5 mg total) by nebulization every 6 (six) hours as needed for wheezing. 75 mL 0  . aspirin EC 81 MG tablet Take 81 mg by mouth at bedtime.    . cholecalciferol (VITAMIN D) 1000 units tablet Take 1,000 Units by mouth at bedtime.     . Magnesium 250 MG TABS Take 250 mg by mouth at bedtime.     [name] PROAIR HFA 108 (90 BASE) MCG/ACT inhaler Inhale 2 puffs into the lungs 4 (four) times daily as needed for wheezing or shortness of breath.     [name] amLODipine (NORVASC) 10 MG tablet Take 1 tablet (10 mg total) by mouth daily. (Patient not taking: Reported on 12/12/2017) 90 tablet 3  . buPROPion (WELLBUTRIN XL) 150 MG 24 hr  tablet Take 150 mg by mouth daily.  2  . fluticasone (FLOVENT HFA) 220 MCG/ACT inhaler Inhale 2 puffs into the lungs 2 (two) times daily.    . furosemide (LASIX) 40 MG tablet Take 0.5 tablets (20 mg total) by mouth every other day. (Patient not taking: Reported on 12/12/2017)    . glimepiride (AMARYL) 1 MG tablet Take 1 mg by mouth daily with breakfast.     . ipratropium-albuterol (DUONEB) 0.5-2.5 (3) MG/3ML SOLN Take 3 mLs by nebulization 4 (four) times daily. Dx: J44.9 (Patient not taking: Reported on 12/12/2017) 360 mL 5  . irbesartan (AVAPRO) 150 MG tablet Take 1 tablet (150 mg total) by mouth daily. (Patient not taking: Reported on 12/12/2017) 90 tablet 3  . Multiple Vitamins-Minerals (OCUVITE-LUTEIN PO) Take 1 tablet by mouth every morning.     . pravastatin (PRAVACHOL) 40 MG tablet Take 40 mg by mouth every evening.     . sertraline (ZOLOFT) 100 MG tablet Take 1 tablet (100 mg total) by mouth every morning. (Patient not taking: Reported on 12/12/2017) 30 tablet 0   No current facility-administered medications for this encounter.     REVIEW OF SYSTEMS: A 10+ POINT REVIEW OF SYSTEMS WAS OBTAINED including neurology, dermatology, psychiatry, cardiac, respiratory, lymph, extremities, GI, GU, musculoskeletal, constitutional, reproductive, HEENT. All pertinent positives are noted in the  HPI. All others are negative.    PHYSICAL EXAM:  height is 5' (1.524 m) and weight is 101 lb (45.8 kg). Her oral temperature is 97.8 F (36.6 C). Her blood pressure is 159/77 (abnormal) and her pulse is 70. Her oxygen saturation is 98%. General: Alert and oriented, in no acute distress. HEENT: Head is normocephalic. Extraocular movements are intact. Dentures noted. Oropharynx is clear. Supplemental oxygen in place by nasal cannula in place on 2.5 L. Neck: Neck is supple, no palpable cervical or supraclavicular lymphadenopathy. Heart: Regular in rate and rhythm with no murmurs, rubs, or gallops. Chest: Clear to  auscultation bilaterally, with no rhonchi, wheezes, or rales. Abdomen: Soft, nontender, nondistended, with no rigidity or guarding. Extremities: No cyanosis or edema. Lymphatics: see Neck Exam Skin: No concerning lesions. Musculoskeletal: symmetric strength and muscle tone throughout. Neurologic: Cranial nerves II through XII are grossly intact. No obvious focalities. Speech is fluent. Coordination is intact. Psychiatric: Judgment and insight are intact. Affect is appropriate.     ECOG = 1  0 - Asymptomatic (Fully active, able to carry on all predisease activities without restriction)  1 - Symptomatic but completely ambulatory (Restricted in physically strenuous activity but ambulatory and able to carry out work of a light or sedentary nature. For example, light housework, office work)  2 - Symptomatic, <50% in bed during the day (Ambulatory and capable of all self care but unable to carry out any work activities. Up and about more than 50% of waking hours)  3 - Symptomatic, >50% in bed, but not bedbound (Capable of only limited self-care, confined to bed or chair 50% or more of waking hours)  4 - Bedbound (Completely disabled. Cannot carry on any self-care. Totally confined to bed or chair)  5 - Death   Eustace Pen MM, Creech RH, Tormey DC, et al. [phone]). "Toxicity and response criteria of the [facility]". [place] Oncol. 5 (6): 649-55  LABORATORY DATA:  Lab Results  Component Value Date   WBC 9.5 10/29/2017   HGB 7.8 (L) 10/29/2017   HCT 26.5 (L) 10/29/2017   MCV 76.6 (L) 10/29/2017   PLT 282 10/29/2017   NEUTROABS 20.3 (H) 11/18/2015   Lab Results  Component Value Date   NA 139 10/29/2017   K 3.9 10/29/2017   CL 105 10/29/2017   CO2 22 10/29/2017   GLUCOSE 111 (H) 10/29/2017   CREATININE 1.38 (H) 10/29/2017   CALCIUM 9.4 10/29/2017      RADIOGRAPHY: Dg Chest 2 View  Result Date: 11/14/2017 CLINICAL DATA:  Cough for the past 3 weeks.   Ex-smoker. EXAM: CHEST  2 VIEW COMPARISON:  10/27/2017. FINDINGS: Normal sized heart. Stable minimal scarring at both lung bases. The lungs remain hyperexpanded. Stable post CABG changes and prosthetic aortic valve. Mild thoracic spine degenerative changes. Small to moderate-sized hiatal hernia. IMPRESSION: 1. No acute abnormality. 2. Stable changes of COPD. 3. Stable hiatal hernia. Electronically Signed   By: Claudie Revering M.D.   On: 11/14/2017 19:49   Ct Chest Wo Contrast  Result Date: 12/03/2017 CLINICAL DATA:  Three-month follow-up lung nodule. EXAM: CT CHEST WITHOUT CONTRAST TECHNIQUE: Multidetector CT imaging of the chest was performed following the standard protocol without IV contrast. COMPARISON:  PET-CT 10/09/2017 and chest CT 08/20/2017 and 11/18/2015 FINDINGS: Cardiovascular: Heart is normal size. There is calcified plaque over the left main and 3 vessel coronary arteries. There is significant calcified plaque over the thoracic aorta. Mild prominence of the pulmonary arteries. Evidence  of prosthetic aortic valve. Mediastinum/Nodes: Moderate size hiatal hernia is present. No significant mediastinal or hilar adenopathy. Remaining mediastinal structures are unremarkable. Lungs/Pleura: Lungs are adequately inflated demonstrate moderate centrilobular emphysematous disease. No focal airspace consolidation or effusion. Subtle peripheral increased interstitial markings over the right lower lobe just above the diaphragm. Tiny calcified granuloma over the posterior left upper lobe. There is no significant change in a subpleural irregular nodular opacity over the lateral left lower lobe measuring 1.1 x 1.7 cm (previously 1.3 x 1.8 cm). This was found to have max SUV on recent PET-CT of 5.3 suspicious for malignancy. Although the measured dimensions are stable, this subjectively appears slightly larger and more nodular along its medial aspect. Possible small amount aspirate material along the left side of the  trachea extending to the origin of the left mainstem bronchus. Upper Abdomen: Calcified plaque over the abdominal aorta is present. Calcification over the renal hilar vessels. Couple punctate calcifications projected over the central hepatic ducts unchanged. Musculoskeletal: Degenerative change of the spine. Stable lower thoracic spine compression fracture. IMPRESSION: Irregular subpleural nodule over the lateral left lower lobe measuring 1.1 x 1.7 cm (previously 1.3 x 1.8 cm). Although measurements are stable, this subjectively appears slightly more nodular/bulky particularly along the medial aspect. This demonstrated mild hypermetabolic activity on previous PET-CT suggesting malignancy. Consider tissue sampling for further evaluation. Aortic Atherosclerosis (ICD10-I70.0) and Emphysema (ICD10-J43.9). Mild increased peripheral interstitial markings over the right base. Possible small amount of aspirate material over the distal trachea/left mainstem bronchus. Atherosclerotic coronary artery disease. Moderate size hiatal hernia. Couple punctate calcifications over the central hepatic ducts unchanged. Electronically Signed   By: Marin Olp M.D.   On: 12/03/2017 11:25      IMPRESSION:Presumptive clinical stage 1 non-small cell lung cancer in the left lower lung. We discussed options for managment including watchful waiting vs definitive radiation treatments with SBRT. We discussed the potential long term effects of radiation including rib fracture given the location of the mass, and pulmonary fibrosis potentially worsening resulting in respiratory demise. After careful consideration the patient decided to proceed with a definitive course of radiation treatment even though we do not have a tissue diagnosis in this situation. We discussed the course of treatment, side effects, and possible toxicities of SBRT with the patient and her husband. A consent form was signed and a copy was provided for the patient.  PLAN:  CT simulation and treatment plan is scheduled for 9 am on 12/13/17.      ------------------------------------------------  Blair Promise, PhD, MD  This document serves as a record of services personally performed by Gery Pray, MD. It was created on his behalf by Bethann Humble, a trained medical scribe. The creation of this record is based on the scribe's personal observations and the provider's statements to them. This document has been checked and approved by the attending provider.

## 2017-12-13 ENCOUNTER — Ambulatory Visit
Admission: RE | Admit: 2017-12-13 | Discharge: 2017-12-13 | Disposition: A | Discharge status: Home / Self Care | Payer: Medicare Other | Source: Ambulatory Visit | Attending: Radiation Oncology | Admitting: Radiation Oncology

## 2017-12-13 DIAGNOSIS — R911 Solitary pulmonary nodule: Secondary | ICD-10-CM | POA: Insufficient documentation

## 2017-12-16 NOTE — Progress Notes (Signed)
  Radiation Oncology         [phone] ________________________________  Name: [name] MRN: [id number]  Date: 12/13/2017  DOB: Apr 11, 1932   STEREOTACTIC BODY RADIOTHERAPY SIMULATION AND TREATMENT PLANNING NOTE    DIAGNOSIS:   Presumptive clinical stage 1 non-small cell lung cancer presenting in the left lower lung   NARRATIVE:  The patient was brought to the [facility].  Identity was confirmed.  All relevant records and images related to the planned course of therapy were reviewed.  The patient freely provided informed written consent to proceed with treatment after reviewing the details related to the planned course of therapy. The consent form was witnessed and verified by the simulation staff.  Then, the patient was set-up in a stable reproducible  supine position for radiation therapy.  A BodyFix immobilization pillow was fabricated for reproducible positioning.  Then I personally applied the abdominal compression paddle to limit respiratory excursion.  4D respiratoy motion management CT images were obtained.  Surface markings were placed.  The CT images were loaded into the planning software.  Then, using Cine, MIP, and standard views, the internal target volume (ITV) and planning target volumes (PTV) were delinieated, and avoidance structures were contoured.  Treatment planning then occurred.  The radiation prescription was entered and confirmed.  A total of two complex treatment devices were fabricated in the form of the BodyFix immobilization pillow and a neck accuform cushion.  I have requested : 3D Simulation  I have requested a DVH of the following structures: Heart, Lungs, Esophagus, Chest Wall, Brachial Plexus, Major Blood Vessels, and targets.  PLAN:  The patient will receive 54 Gy in 3 fractions assuming dose constraints concerning chest wall constraints are met with this fractionation scheme.  -----------------------------------  Blair Promise, PhD,  MD

## 2017-12-17 DIAGNOSIS — R911 Solitary pulmonary nodule: Secondary | ICD-10-CM | POA: Insufficient documentation

## 2017-12-17 DIAGNOSIS — Z510 Encounter for antineoplastic radiation therapy: Secondary | ICD-10-CM | POA: Insufficient documentation

## 2017-12-20 ENCOUNTER — Ambulatory Visit: Payer: Medicare Other | Admitting: Radiation Oncology

## 2017-12-20 DIAGNOSIS — R911 Solitary pulmonary nodule: Secondary | ICD-10-CM | POA: Diagnosis not present

## 2017-12-20 DIAGNOSIS — Z510 Encounter for antineoplastic radiation therapy: Secondary | ICD-10-CM | POA: Diagnosis not present

## 2017-12-21 ENCOUNTER — Ambulatory Visit
Admission: RE | Admit: 2017-12-21 | Discharge: 2017-12-21 | Disposition: A | Discharge status: Home / Self Care | Payer: Medicare Other | Source: Ambulatory Visit | Attending: Radiation Oncology | Admitting: Radiation Oncology

## 2017-12-21 DIAGNOSIS — R911 Solitary pulmonary nodule: Secondary | ICD-10-CM | POA: Diagnosis not present

## 2017-12-24 ENCOUNTER — Ambulatory Visit: Payer: Medicare Other | Admitting: Cardiology

## 2017-12-24 ENCOUNTER — Encounter: Payer: Self-pay | Admitting: Cardiology

## 2017-12-24 ENCOUNTER — Ambulatory Visit
Admission: RE | Admit: 2017-12-24 | Discharge: 2017-12-24 | Disposition: A | Discharge status: Home / Self Care | Payer: Medicare Other | Source: Ambulatory Visit | Attending: Radiation Oncology | Admitting: Radiation Oncology

## 2017-12-24 VITALS — BP 124/68 | HR 78 | Ht 60.0 in | Wt 100.6 lb

## 2017-12-24 DIAGNOSIS — I2510 Atherosclerotic heart disease of native coronary artery without angina pectoris: Secondary | ICD-10-CM

## 2017-12-24 DIAGNOSIS — R911 Solitary pulmonary nodule: Secondary | ICD-10-CM | POA: Diagnosis not present

## 2017-12-24 DIAGNOSIS — Z952 Presence of prosthetic heart valve: Secondary | ICD-10-CM

## 2017-12-24 DIAGNOSIS — E7800 Pure hypercholesterolemia, unspecified: Secondary | ICD-10-CM | POA: Diagnosis not present

## 2017-12-24 DIAGNOSIS — I10 Essential (primary) hypertension: Secondary | ICD-10-CM

## 2017-12-24 NOTE — Progress Notes (Signed)
  Radiation Oncology         [phone] ________________________________  Name: [name] MRN: [id number]  Date: 12/24/2017  DOB: 03/15/1932  Stereotactic Body Radiotherapy Treatment Procedure Note  NARRATIVE:  [name] was brought to the stereotactic radiation treatment machine and placed supine on the CT couch. The patient was set up for stereotactic body radiotherapy on the body fix pillow.  3D TREATMENT PLANNING AND DOSIMETRY:  The patient's radiation plan was reviewed and approved prior to starting treatment.  It showed 3-dimensional radiation distributions overlaid onto the planning CT.  The [facility] for the target structures as well as the organs at risk were reviewed. The documentation of this is filed in the radiation oncology EMR.  SIMULATION VERIFICATION:  The patient underwent CT imaging on the treatment unit.  These were carefully aligned to document that the ablative radiation dose would cover the target volume and maximally spare the nearby organs at risk according to the planned distribution.  SPECIAL TREATMENT PROCEDURE: [name] received high dose ablative stereotactic body radiotherapy to the planned target volume without unforeseen complications. Treatment was delivered uneventfully. The high doses associated with stereotactic body radiotherapy and the significant potential risks require careful treatment set up and patient monitoring constituting a special treatment procedure   STEREOTACTIC TREATMENT MANAGEMENT:  Following delivery, the patient was evaluated clinically. The patient tolerated treatment without significant acute effects, and was discharged to home in stable condition.    PLAN: Continue treatment as planned.  ________________________________  Blair Promise, PhD, MD

## 2017-12-24 NOTE — Patient Instructions (Signed)

## 2017-12-25 ENCOUNTER — Ambulatory Visit: Payer: Medicare Other | Admitting: Radiation Oncology

## 2017-12-26 ENCOUNTER — Ambulatory Visit
Admission: RE | Admit: 2017-12-26 | Discharge: 2017-12-26 | Disposition: A | Discharge status: Home / Self Care | Payer: Medicare Other | Source: Ambulatory Visit | Attending: Radiation Oncology | Admitting: Radiation Oncology

## 2017-12-26 ENCOUNTER — Ambulatory Visit: Payer: Medicare Other

## 2017-12-26 DIAGNOSIS — R911 Solitary pulmonary nodule: Secondary | ICD-10-CM

## 2017-12-26 NOTE — Progress Notes (Signed)
  Radiation Oncology         [phone] ________________________________  Name: [name] MRN: [id number]  Date: 12/26/2017  DOB: [birth year]  Stereotactic Body Radiotherapy Treatment Procedure Note  NARRATIVE:  [name] was brought to the stereotactic radiation treatment machine and placed supine on the CT couch. The patient was set up for stereotactic body radiotherapy on the body fix pillow.  3D TREATMENT PLANNING AND DOSIMETRY:  The patient's radiation plan was reviewed and approved prior to starting treatment.  It showed 3-dimensional radiation distributions overlaid onto the planning CT.  The [facility] for the target structures as well as the organs at risk were reviewed. The documentation of this is filed in the radiation oncology EMR.  SIMULATION VERIFICATION:  The patient underwent CT imaging on the treatment unit.  These were carefully aligned to document that the ablative radiation dose would cover the target volume and maximally spare the nearby organs at risk according to the planned distribution.  SPECIAL TREATMENT PROCEDURE: [name] received high dose ablative stereotactic body radiotherapy to the planned target volume without unforeseen complications. Treatment was delivered uneventfully. The high doses associated with stereotactic body radiotherapy and the significant potential risks require careful treatment set up and patient monitoring constituting a special treatment procedure   STEREOTACTIC TREATMENT MANAGEMENT:  Following delivery, the patient was evaluated clinically. The patient tolerated treatment without significant acute effects, and was discharged to home in stable condition.    PLAN: Continue treatment as planned.  ________________________________  Blair Promise, PhD, MD

## 2017-12-27 ENCOUNTER — Ambulatory Visit: Payer: Medicare Other | Admitting: Radiation Oncology

## 2017-12-28 ENCOUNTER — Ambulatory Visit
Admission: RE | Admit: 2017-12-28 | Discharge: 2017-12-28 | Disposition: A | Discharge status: Home / Self Care | Payer: Medicare Other | Source: Ambulatory Visit | Attending: Radiation Oncology | Admitting: Radiation Oncology

## 2017-12-28 DIAGNOSIS — R911 Solitary pulmonary nodule: Secondary | ICD-10-CM | POA: Diagnosis not present

## 2017-12-31 ENCOUNTER — Ambulatory Visit
Admission: RE | Admit: 2017-12-31 | Discharge: 2017-12-31 | Disposition: A | Discharge status: Home / Self Care | Payer: Medicare Other | Source: Ambulatory Visit | Attending: Radiation Oncology | Admitting: Radiation Oncology

## 2017-12-31 ENCOUNTER — Encounter: Payer: Self-pay | Admitting: Radiation Oncology

## 2017-12-31 DIAGNOSIS — R911 Solitary pulmonary nodule: Secondary | ICD-10-CM | POA: Diagnosis not present

## 2017-12-31 NOTE — Progress Notes (Signed)
  Radiation Oncology         [phone] ________________________________  Name: EMILIYA CHRETIEN MRN: [id number]  Date: 12/31/2017  DOB: 1932-01-09  Stereotactic Body Radiotherapy Treatment Procedure Note  NARRATIVE:  NIKOL LEMAR was brought to the stereotactic radiation treatment machine and placed supine on the CT couch. The patient was set up for stereotactic body radiotherapy on the body fix pillow.  3D TREATMENT PLANNING AND DOSIMETRY:  The patient's radiation plan was reviewed and approved prior to starting treatment.  It showed 3-dimensional radiation distributions overlaid onto the planning CT.  The [facility] for the target structures as well as the organs at risk were reviewed. The documentation of this is filed in the radiation oncology EMR.  SIMULATION VERIFICATION:  The patient underwent CT imaging on the treatment unit.  These were carefully aligned to document that the ablative radiation dose would cover the target volume and maximally spare the nearby organs at risk according to the planned distribution.  SPECIAL TREATMENT PROCEDURE: SOLINA HERON received high dose ablative stereotactic body radiotherapy to the planned target volume without unforeseen complications. Treatment was delivered uneventfully. The high doses associated with stereotactic body radiotherapy and the significant potential risks require careful treatment set up and patient monitoring constituting a special treatment procedure   STEREOTACTIC TREATMENT MANAGEMENT:  Following delivery, the patient was evaluated clinically. The patient tolerated treatment without significant acute effects, and was discharged to home in stable condition.    PLAN: routine follow-up in 1 month.  ________________________________  Blair Promise, PhD, MD  This document serves as a record of services personally performed by Gery Pray, MD. It was created on his behalf by Bethann Humble, a trained medical scribe. The creation of this  record is based on the scribe's personal observations and the provider's statements to them. This document has been checked and approved by the attending provider.

## 2017-12-31 NOTE — Progress Notes (Signed)
  Radiation Oncology         [phone] ________________________________  Name: [name] MRN: [id number]  Date: 12/31/2017  DOB: Dec 25, 1931  End of Treatment Note  Diagnosis: Presumptive clinical stage 1 non-small cell lung cancer presenting in the left lower lung    Indication for treatment:  Curative      Radiation treatment dates: 12/21/17-12/31/17  Site/dose: Left lung/ 50 Gy in 5 fractions  Beams/energy:  SBRT SRT-VMAT/ 6X  Narrative: The patient tolerated radiation treatment relatively well. The patient complained of mild fatigue and shortness of breath with activity. She noted occasional difficulty swallowing, and an occasional productive cough with clear phlegm. She denied pain throughout the treatment.  Plan: The patient has completed radiation treatment. The patient will return to radiation oncology clinic for routine followup in one month. I advised them to call or return sooner if they have any questions or concerns related to their recovery or treatment.  -----------------------------------  Blair Promise, PhD, MD  This document serves as a record of services personally performed by Gery Pray, MD. It was created on his behalf by Bethann Humble, a trained medical scribe. The creation of this record is based on the scribe's personal observations and the provider's statements to them. This document has been checked and approved by the attending provider.

## 2018-01-01 ENCOUNTER — Other Ambulatory Visit: Payer: Self-pay

## 2018-01-01 ENCOUNTER — Ambulatory Visit (HOSPITAL_COMMUNITY): Payer: Medicare Other | Attending: Cardiovascular Disease

## 2018-01-01 DIAGNOSIS — Z952 Presence of prosthetic heart valve: Secondary | ICD-10-CM | POA: Diagnosis not present

## 2018-01-01 DIAGNOSIS — I2510 Atherosclerotic heart disease of native coronary artery without angina pectoris: Secondary | ICD-10-CM | POA: Insufficient documentation

## 2018-01-01 DIAGNOSIS — I272 Pulmonary hypertension, unspecified: Secondary | ICD-10-CM | POA: Diagnosis not present

## 2018-01-01 DIAGNOSIS — E1122 Type 2 diabetes mellitus with diabetic chronic kidney disease: Secondary | ICD-10-CM | POA: Insufficient documentation

## 2018-01-01 DIAGNOSIS — N183 Chronic kidney disease, stage 3 (moderate): Secondary | ICD-10-CM | POA: Insufficient documentation

## 2018-01-01 DIAGNOSIS — J449 Chronic obstructive pulmonary disease, unspecified: Secondary | ICD-10-CM | POA: Diagnosis not present

## 2018-01-01 DIAGNOSIS — I509 Heart failure, unspecified: Secondary | ICD-10-CM | POA: Insufficient documentation

## 2018-01-01 DIAGNOSIS — I340 Nonrheumatic mitral (valve) insufficiency: Secondary | ICD-10-CM | POA: Diagnosis not present

## 2018-01-01 DIAGNOSIS — E785 Hyperlipidemia, unspecified: Secondary | ICD-10-CM | POA: Insufficient documentation

## 2018-01-13 ENCOUNTER — Encounter (HOSPITAL_COMMUNITY): Payer: Self-pay | Admitting: *Deleted

## 2018-01-13 ENCOUNTER — Other Ambulatory Visit: Payer: Self-pay

## 2018-01-13 ENCOUNTER — Emergency Department (HOSPITAL_COMMUNITY)
Admission: EM | Admit: 2018-01-13 | Discharge: 2018-01-14 | Disposition: A | Discharge status: Home / Self Care | Payer: Medicare Other | Attending: Emergency Medicine | Admitting: Emergency Medicine

## 2018-01-13 ENCOUNTER — Emergency Department (HOSPITAL_COMMUNITY): Payer: Medicare Other

## 2018-01-13 DIAGNOSIS — Z7982 Long term (current) use of aspirin: Secondary | ICD-10-CM | POA: Insufficient documentation

## 2018-01-13 DIAGNOSIS — S0101XA Laceration without foreign body of scalp, initial encounter: Secondary | ICD-10-CM

## 2018-01-13 DIAGNOSIS — J449 Chronic obstructive pulmonary disease, unspecified: Secondary | ICD-10-CM | POA: Diagnosis not present

## 2018-01-13 DIAGNOSIS — Z79899 Other long term (current) drug therapy: Secondary | ICD-10-CM | POA: Insufficient documentation

## 2018-01-13 DIAGNOSIS — I2510 Atherosclerotic heart disease of native coronary artery without angina pectoris: Secondary | ICD-10-CM | POA: Diagnosis not present

## 2018-01-13 DIAGNOSIS — Y92009 Unspecified place in unspecified non-institutional (private) residence as the place of occurrence of the external cause: Secondary | ICD-10-CM | POA: Insufficient documentation

## 2018-01-13 DIAGNOSIS — W0110XA Fall on same level from slipping, tripping and stumbling with subsequent striking against unspecified object, initial encounter: Secondary | ICD-10-CM | POA: Insufficient documentation

## 2018-01-13 DIAGNOSIS — Y999 Unspecified external cause status: Secondary | ICD-10-CM | POA: Insufficient documentation

## 2018-01-13 DIAGNOSIS — I130 Hypertensive heart and chronic kidney disease with heart failure and stage 1 through stage 4 chronic kidney disease, or unspecified chronic kidney disease: Secondary | ICD-10-CM | POA: Insufficient documentation

## 2018-01-13 DIAGNOSIS — Y939 Activity, unspecified: Secondary | ICD-10-CM | POA: Insufficient documentation

## 2018-01-13 DIAGNOSIS — W19XXXA Unspecified fall, initial encounter: Secondary | ICD-10-CM

## 2018-01-13 DIAGNOSIS — N183 Chronic kidney disease, stage 3 (moderate): Secondary | ICD-10-CM | POA: Diagnosis not present

## 2018-01-13 DIAGNOSIS — Z87891 Personal history of nicotine dependence: Secondary | ICD-10-CM | POA: Insufficient documentation

## 2018-01-13 DIAGNOSIS — E119 Type 2 diabetes mellitus without complications: Secondary | ICD-10-CM | POA: Diagnosis not present

## 2018-01-13 DIAGNOSIS — I5032 Chronic diastolic (congestive) heart failure: Secondary | ICD-10-CM | POA: Insufficient documentation

## 2018-01-13 DIAGNOSIS — S0990XA Unspecified injury of head, initial encounter: Secondary | ICD-10-CM | POA: Insufficient documentation

## 2018-01-13 NOTE — ED Triage Notes (Signed)
Pt tripped and fell at home, hitting her head on a bed frame. Pt denies LOC; reports frequent falls at home. Is not on blood thinners. Bleeding controlled at present; lac and hematoma noted to posterior head

## 2018-01-14 NOTE — Discharge Instructions (Addendum)
Return to the emergency department for severe headache, confusion, visual disturbances, or other new and concerning symptoms.

## 2018-01-14 NOTE — ED Provider Notes (Signed)
[facility] EMERGENCY DEPARTMENT Provider Note   CSN: [id number] Arrival date & time: 01/13/18  1830     History   Chief Complaint Chief Complaint  Patient presents with  . Fall    HPI [name] is a 84 y.o. female.  Patient is an 84 year old female with past medical history of COPD on home O2, aortic stenosis, hypertension.  She presents today for evaluation of a fall.  She states that she was in her home when she lost her equilibrium, fell backward, and struck the back of her head.  She denies any loss of consciousness or severe headache.  She denies any visual disturbances.  She denies any numbness or tingling.  She denies any neck pain.  She did sustain a laceration to the back of the head.  Bleeding is controlled with direct pressure.  She has a history of equilibrium issues and falling is not unusual for her.  The history is provided by the patient.  Fall  This is a recurrent problem. The current episode started 1 to 2 hours ago. The problem occurs constantly. The problem has been resolved. Nothing aggravates the symptoms. Nothing relieves the symptoms. She has tried nothing for the symptoms.    Past Medical History:  Diagnosis Date  . Anxiety   . Aortic stenosis   . Arthritis   . Asthma    uses inhaler as needed  . Depression   . Diabetes mellitus   . Emphysema of lung ([place])   . GERD (gastroesophageal reflux disease)   . Glaucoma   . Hyperlipidemia   . Hypertension   . [name]   . Murmur   . Nephrolithiasis   . Pneumonia 3/17 and 3/18    Patient Active Problem List   Diagnosis Date Noted  . Pulmonary nodule 12/03/2017  . Iron deficiency anemia   . Heme + stool   . CAD (coronary artery disease) 10/27/2017  . Microcytic anemia 10/27/2017  . Anemia due to GI blood loss 10/27/2017  . COPD with acute exacerbation ([place]) 10/27/2017  . Pulmonary nodule, left 10/15/2017  . Anemia, chronic disease 12/07/2016  .  Chronic obstructive pulmonary disease ([place])   . Acute on chronic respiratory failure with hypoxia ([place]) 11/19/2016  . CKD (chronic kidney disease), stage III ([place]) 11/19/2016  . Diabetes mellitus type 2, uncontrolled ([place]) 11/19/2016  . Recurrent pneumonia 11/19/2016  . Chronic diastolic CHF (congestive heart failure) ([place]) 11/19/2016  . Pulmonary HTN ([place]) 11/19/2016  . HLD (hyperlipidemia) 11/19/2016  . Hiatal hernia 05/01/2016  . History of bacterial pneumonia 05/01/2016  . COPD exacerbation ([place]) 11/18/2015  . Physical deconditioning 07/24/2013  . S/P AVR (aortic valve replacement) 07/23/2013  . S/P CABG x 1 07/23/2013  . Aortic stenosis 06/10/2013  . Essential hypertension 06/10/2013  . COPD GOLD II  10/26/2011    Past Surgical History:  Procedure Laterality Date  . ABDOMINAL HYSTERECTOMY    . ANKLE SURGERY  1998   d/t fx.  Left ankle  . ANTERIOR AND POSTERIOR REPAIR  06/21/2011   Procedure: ANTERIOR (CYSTOCELE) AND POSTERIOR REPAIR (RECTOCELE);  Surgeon: Maisie Fus;  Location: [facility];  Service: Gynecology;  Laterality: N/A;  . AORTIC VALVE REPLACEMENT N/A 07/17/2013   Procedure: AORTIC VALVE REPLACEMENT (AVR);  Surgeon: Ivin Poot, MD;  Location: [facility];  Service: Open Heart Surgery;  Laterality: N/A;  . CARDIAC CATHETERIZATION    . CARDIAC VALVE REPLACEMENT    . CORONARY ANGIOPLASTY    .  CORONARY ARTERY BYPASS GRAFT N/A 07/17/2013   Procedure: CORONARY ARTERY BYPASS GRAFT, TIMES ONE, ON PUMP, USING RIGHT INTERNAL MAMMARY ARTERY.;  Surgeon: Ivin Poot, MD;  Location: [facility];  Service: Open Heart Surgery;  Laterality: N/A;  . FACIAL COSMETIC SURGERY     face lift  . INTRAOPERATIVE TRANSESOPHAGEAL ECHOCARDIOGRAM N/A 07/17/2013   Procedure: INTRAOPERATIVE TRANSESOPHAGEAL ECHOCARDIOGRAM;  Surgeon: Ivin Poot, MD;  Location: [facility];  Service: Open Heart Surgery;  Laterality: N/A;  . LAPAROSCOPIC ASSISTED VAGINAL HYSTERECTOMY  06/21/2011   Procedure: LAPAROSCOPIC  ASSISTED VAGINAL HYSTERECTOMY;  Surgeon: Maisie Fus;  Location: [facility];  Service: Gynecology;  Laterality: N/A;  . LEFT AND RIGHT HEART CATHETERIZATION WITH CORONARY ANGIOGRAM N/A 06/27/2013   Procedure: LEFT AND RIGHT HEART CATHETERIZATION WITH CORONARY ANGIOGRAM;  Surgeon: Ramond Dial, MD;  Location: [facility] C[facility];  Service: Cardiovascular;  Laterality: N/A;  . NOSE SURGERY    . SALPINGOOPHORECTOMY  06/21/2011   Procedure: SALPINGO OOPHERECTOMY;  Surgeon: Maisie Fus;  Location: [facility];  Service: Gynecology;  Laterality: Bilateral;  . VAGINAL PROLAPSE REPAIR  06/21/2011   Procedure: SACROPEXY;  Surgeon: Maisie Fus;  Location: [facility];  Service: Gynecology;  Laterality: N/A;  sacrospinous ligament suspension     OB History   None      Home Medications    Prior to Admission medications   Medication Sig Start Date End Date Taking? Authorizing Provider  acetaminophen (TYLENOL) 500 MG tablet Take 500 mg by mouth every 6 (six) hours as needed for headache.    [provider]  albuterol (PROVENTIL) (2.5 MG/3ML) 0.083% nebulizer solution Take 3 mLs (2.5 mg total) by nebulization every 6 (six) hours as needed for wheezing. 11/23/16   Eugenie Filler, MD  alendronate (FOSAMAX) 70 MG tablet Take 70 mg by mouth once a week. Take with a full glass of water on an empty stomach.    [provider]  amLODipine (NORVASC) 10 MG tablet Take 1 tablet (10 mg total) by mouth daily. 10/19/15   Lelon Perla, MD  aspirin EC 81 MG tablet Take 81 mg by mouth at bedtime.    [provider]  buPROPion (WELLBUTRIN XL) 150 MG 24 hr tablet Take 150 mg by mouth daily. 06/04/17   [provider]  cholecalciferol (VITAMIN D) 1000 units tablet Take 1,000 Units by mouth at bedtime.     [provider]  Docusate Sodium (COLACE PO) Take by mouth 2 (two) times daily.    [provider]  Ferrous Sulfate (IRON) 325 (65 Fe) MG TABS Take by mouth 2 (two) times  daily.    [provider]  fluticasone (FLOVENT HFA) 220 MCG/ACT inhaler Inhale 2 puffs into the lungs 2 (two) times daily.    [provider]  furosemide (LASIX) 40 MG tablet Take 0.5 tablets (20 mg total) by mouth every other day. 10/29/17   Hongalgi, Lenis Dickinson, MD  irbesartan (AVAPRO) 150 MG tablet Take 1 tablet (150 mg total) by mouth daily. 10/12/14   Lelon Perla, MD  Magnesium 250 MG TABS Take 250 mg by mouth at bedtime.     [provider]  Multiple Vitamins-Minerals (OCUVITE-LUTEIN PO) Take 1 tablet by mouth every morning.     [provider]  pravastatin (PRAVACHOL) 40 MG tablet Take 40 mg by mouth every evening.     [provider]  PROAIR HFA 108 (90 BASE) MCG/ACT inhaler Inhale 2 puffs into the  lungs 4 (four) times daily as needed for wheezing or shortness of breath.  02/24/14   [provider]  sertraline (ZOLOFT) 100 MG tablet Take 1 tablet (100 mg total) by mouth every morning. 11/24/16   Eugenie Filler, MD    Family History Family History  Problem Relation Age of Onset  . Emphysema Father   . Colon cancer Neg Hx   . Esophageal cancer Neg Hx   . Rectal cancer Neg Hx   . Stomach cancer Neg Hx     Social History Social History   Tobacco Use  . Smoking status: Former Smoker    Packs/day: 2.00    Years: 15.00    Pack years: 30.00    Types: Cigarettes    Last attempt to quit: 09/18/1972    Years since quitting: 45.3  . Smokeless tobacco: Never Used  Substance Use Topics  . Alcohol use: No    Alcohol/week: 0.0 oz  . Drug use: No     Allergies   Lipitor [atorvastatin]; Lisinopril; Amaryl [glimepiride]; Avelox [moxifloxacin hcl in nacl]; Ciprofloxacin; Other; Prevacid [lansoprazole]; Spiriva handihaler [tiotropium bromide monohydrate]; and Symbicort [budesonide-formoterol fumarate]   Review of Systems Review of Systems  All other systems reviewed and are negative.    Physical Exam Updated Vital Signs BP  (!) 199/81   Pulse (!) 58   Temp 97.9 F (36.6 C) (Oral)   Resp 16   SpO2 95%   Physical Exam  Constitutional: She is oriented to person, place, and time. She appears well-developed and well-nourished. No distress.  HENT:  Head: Normocephalic and atraumatic.  There is a 1.5 cm laceration to the occiput with no active bleeding.  The laceration is well approximated.  Eyes: Pupils are equal, round, and reactive to light. EOM are normal.  Neck: Normal range of motion. Neck supple.  There is no cervical spine tenderness.  She has painless range of motion in all directions.  Cardiovascular: Normal rate and regular rhythm. Exam reveals no gallop and no friction rub.  No murmur heard. Pulmonary/Chest: Effort normal and breath sounds normal. No respiratory distress. She has no wheezes.  Abdominal: Soft. Bowel sounds are normal. She exhibits no distension. There is no tenderness.  Musculoskeletal: Normal range of motion.  Neurological: She is alert and oriented to person, place, and time. No cranial nerve deficit. She exhibits normal muscle tone. Coordination normal.  Skin: Skin is warm and dry. She is not diaphoretic.  Nursing note and vitals reviewed.    ED Treatments / Results  Labs (all labs ordered are listed, but only abnormal results are displayed) Labs Reviewed - No data to display  EKG None  Radiology Ct Head Wo Contrast  Result Date: 01/13/2018 CLINICAL DATA:  Patient status post fall. No reported loss of consciousness. EXAM: CT HEAD WITHOUT CONTRAST TECHNIQUE: Contiguous axial images were obtained from the base of the skull through the vertex without intravenous contrast. COMPARISON:  Brain CT 10/16/2017. FINDINGS: Brain: Ventricles and sulci are prominent compatible with atrophy. Periventricular and subcortical white matter hypodensity compatible with chronic microvascular ischemic changes. No evidence for acute cortically based infarct, intracranial hemorrhage, mass lesion or  mass-effect. Vascular: Internal carotid arterial vascular calcifications. Skull: Intact.  No displaced skull fracture. Sinuses/Orbits: Mucosal thickening left maxillary sinus. Mastoid air cells unremarkable. Orbits are unremarkable. Other: Soft tissue thickening overlying the posterior right calvarium compatible with soft tissue injury. IMPRESSION: No acute intracranial process. Atrophy and chronic microvascular ischemic changes. Electronically Signed   By: Dian Situ  Rosana Hoes M.D.   On: 01/13/2018 21:39    Procedures Procedures (including critical care time)  Medications Ordered in ED Medications - No data to display   Initial Impression / Assessment and Plan / ED Course  I have reviewed the triage vital signs and the nursing notes.  Pertinent labs & imaging results that were available during my care of the patient were reviewed by me and considered in my medical decision making (see chart for details).  Patient presents after losing her balance and falling.  There is no loss of consciousness, neurologic exam is nonfocal, and CT scan is unremarkable.  The laceration is well approximated and bleeding is controlled.  I see no indication for sutures or staples.  She will be discharged, to return as needed for any problems.  Final Clinical Impressions(s) / ED Diagnoses   Final diagnoses:  None    ED Discharge Orders    None       Veryl Speak, MD 01/14/18 0005

## 2018-01-15 NOTE — Therapy (Signed)
[facility] [address] [place], [place], [address] Phone: [phone]   Fax:  [phone]  Patient Details  Name: [name] MRN: [id number] Date of Birth: 1932-08-05 Referring Provider:  No ref. provider found  Encounter Date: 01/15/2018  PHYSICAL THERAPY DISCHARGE SUMMARY  Visits from Start of Care: 10  Current functional level related to goals / functional outcomes: PT Short Term Goals - 10/09/17 1154      PT SHORT TERM GOAL #1   Title  Pt will be IND in HEP to improve balance, endurance, strength, and flexibility. TARGET DATE FOR ALL STGS: 10/02/17    Status  Achieved      PT SHORT TERM GOAL #2   Title  Pt will improve DGI score to >/=16/24 to decr. falls risk.     Status  Achieved      PT SHORT TERM GOAL #3   Title  Pt will improve gait speed with LRAD, not holding SpO2 machine to >/=2.62 ft/sec. to safely amb. in the community.     Status  Not Met      PT SHORT TERM GOAL #4   Title  Pt will report zero falls over the last 2 weeks to improve safety.     Status  Not Met      PT SHORT TERM GOAL #5   Title  Perform 6MWT and write goals as indicated.     Status  Achieved      PT SHORT TERM GOAL #6   Title  Pt will amb. 500' with LRAD, not carrying SpO2 machine, at MOD I level to improve safety during functional mobility.     Status  Achieved      PT Long Term Goals - 09/12/17 1154      PT LONG TERM GOAL #1   Title  Pt will verbalize understanding fall prevention strategies to reduce falls risk. TARGET DATE FOR ALL LTGS: 10/30/17    Status  New      PT LONG TERM GOAL #2   Title  Pt will improve DGI score to >/=20/24 to decr. falls risk.     Status  New      PT LONG TERM GOAL #3   Title  Pt will amb. 600' over even/uneven terrain with LRAD, at MOD I level, including traversing curbs to improve safety during functional mobility.     Status  New      PT LONG TERM GOAL #4   Title  Pt will amb.  100', at MOD I level (slower speed), over even terrain to amb. safely at home.     Status  New      PT LONG TERM GOAL #5   Title  Pt will improve walking distance during 6MWT to 694' to improve endurance.     Status  New         Remaining deficits: Change in medical status, did not return. Spoke with pt and she'd like to come back now, as she's been cleared by her MD.   Education / Equipment: HEP  Plan: Patient agrees to discharge.  Patient goals were not met. Patient is being discharged due to a change in medical status.  ?????       Miller,Jennifer L 01/15/2018, 2:52 PM  [facility] [address] [place], [place], [address] Phone: [phone]   Fax:  [phone]   Geoffry Paradise, PT,DPT 01/15/18 2:52 PM Phone: [phone] Fax: [phone]

## 2018-01-25 ENCOUNTER — Ambulatory Visit: Payer: Medicare Other | Attending: Family Medicine

## 2018-01-25 ENCOUNTER — Other Ambulatory Visit: Payer: Self-pay

## 2018-01-25 DIAGNOSIS — M6281 Muscle weakness (generalized): Secondary | ICD-10-CM | POA: Diagnosis present

## 2018-01-25 DIAGNOSIS — R2689 Other abnormalities of gait and mobility: Secondary | ICD-10-CM | POA: Diagnosis present

## 2018-01-25 DIAGNOSIS — R42 Dizziness and giddiness: Secondary | ICD-10-CM | POA: Diagnosis present

## 2018-01-25 DIAGNOSIS — R2681 Unsteadiness on feet: Secondary | ICD-10-CM | POA: Insufficient documentation

## 2018-01-25 NOTE — Therapy (Signed)
[facility] [address] [place], [place], [address] Phone: [phone]   Fax:  [phone]  Physical Therapy Evaluation  Patient Details  Name: [name] MRN: [id number] Date of Birth: 06/24/1932 Referring Provider: Dr. Dema Severin   Encounter Date: 01/25/2018  PT End of Session - 01/25/18 0945    Visit Number  1    Number of Visits  17    Date for PT Re-Evaluation  03/26/18    Authorization Type  [employer] Medicare    PT Start Time  0174    PT Stop Time  0928    PT Time Calculation (min)  41 min    Equipment Utilized During Treatment  -- min guard to S prn    Activity Tolerance  Patient tolerated treatment well    Behavior During Therapy  [facility] for tasks assessed/performed       Past Medical History:  Diagnosis Date  . Anxiety   . Aortic stenosis   . Arthritis   . Asthma    uses inhaler as needed  . Depression   . Diabetes mellitus   . Emphysema of lung ([place])   . GERD (gastroesophageal reflux disease)   . Glaucoma   . Hyperlipidemia   . Hypertension   . [name]   . Murmur   . Nephrolithiasis   . Pneumonia 3/17 and 3/18    Past Surgical History:  Procedure Laterality Date  . ABDOMINAL HYSTERECTOMY    . ANKLE SURGERY  1998   d/t fx.  Left ankle  . ANTERIOR AND POSTERIOR REPAIR  06/21/2011   Procedure: ANTERIOR (CYSTOCELE) AND POSTERIOR REPAIR (RECTOCELE);  Surgeon: Maisie Fus;  Location: [facility];  Service: Gynecology;  Laterality: N/A;  . AORTIC VALVE REPLACEMENT N/A 07/17/2013   Procedure: AORTIC VALVE REPLACEMENT (AVR);  Surgeon: Ivin Poot, MD;  Location: [facility];  Service: Open Heart Surgery;  Laterality: N/A;  . CARDIAC CATHETERIZATION    . CARDIAC VALVE REPLACEMENT    . CORONARY ANGIOPLASTY    . CORONARY ARTERY BYPASS GRAFT N/A 07/17/2013   Procedure: CORONARY ARTERY BYPASS GRAFT, TIMES ONE, ON PUMP, USING RIGHT INTERNAL MAMMARY ARTERY.;  Surgeon: Ivin Poot, MD;   Location: [facility];  Service: Open Heart Surgery;  Laterality: N/A;  . FACIAL COSMETIC SURGERY     face lift  . INTRAOPERATIVE TRANSESOPHAGEAL ECHOCARDIOGRAM N/A 07/17/2013   Procedure: INTRAOPERATIVE TRANSESOPHAGEAL ECHOCARDIOGRAM;  Surgeon: Ivin Poot, MD;  Location: [facility];  Service: Open Heart Surgery;  Laterality: N/A;  . LAPAROSCOPIC ASSISTED VAGINAL HYSTERECTOMY  06/21/2011   Procedure: LAPAROSCOPIC ASSISTED VAGINAL HYSTERECTOMY;  Surgeon: Maisie Fus;  Location: [facility];  Service: Gynecology;  Laterality: N/A;  . LEFT AND RIGHT HEART CATHETERIZATION WITH CORONARY ANGIOGRAM N/A 06/27/2013   Procedure: LEFT AND RIGHT HEART CATHETERIZATION WITH CORONARY ANGIOGRAM;  Surgeon: Ramond Dial, MD;  Location: [facility] CATH LAB;  Service: Cardiovascular;  Laterality: N/A;  . NOSE SURGERY    . SALPINGOOPHORECTOMY  06/21/2011   Procedure: SALPINGO OOPHERECTOMY;  Surgeon: Maisie Fus;  Location: [facility];  Service: Gynecology;  Laterality: Bilateral;  . VAGINAL PROLAPSE REPAIR  06/21/2011   Procedure: SACROPEXY;  Surgeon: Maisie Fus;  Location: [facility];  Service: Gynecology;  Laterality: N/A;  sacrospinous ligament suspension    There were no vitals filed for this visit.   Subjective Assessment - 01/25/18 0855    Subjective  Pt is familiar to this clinic, as she  was receiving PT at this clinic for [place]. endurance, strength, and falls risk. She had to take a break 2/2 bronchitis and illness, MD sent new referral for falls risk. Pt has experienced 5-6 falls in the last 6 months. Last fall was about 3 weeks ago, and she went to ED as she hit her head. Pt reports imaging was negative for head injury but reports dizziness since fall. Dizziness is worse in the morning, pt describes as wooziness and she uses cane to amb. to bathroom in the morning and rollator at all other times. Pt states she's been sitting a lot and feels a bit depressed. Pt reports she has performed former HEP a few times a week since  last PT visit. Pt is utilizing 2L of SpO2 at all times. Pt is going on 02/04/18 for an appt. to find out if lung nodule is cancerous.     Pertinent History  2L of SpO2, osteoporosis, COPD, aortic valve replacement, HTN, HLD, DM, depression, diverticulosis, incontinence, glaucoma, 02/2011: 40-50% carotid stenosis, emphysema, CKD stage 3, CAD, hiatal hernia    Patient Stated Goals  She'd love to stop using SpO2 as it is heavy, she'd like to walk without an AD.    Currently in Pain?  No/denies         [facility] PT Assessment - 01/25/18 0905      Assessment   Medical Diagnosis  Frequent falls and deconditioning    Referring Provider  Dr. Dema Severin    Onset Date/Surgical Date  -- 2017 and getting worse    Hand Dominance  Right    Prior Therapy  OPPT neuro      Precautions   Precautions  Fall;Other (comment) SpO2 2L      Restrictions   Weight Bearing Restrictions  No      Balance Screen   Has the patient fallen in the past 6 months  Yes    How many times?  5-6    Has the patient had a decrease in activity level because of a fear of falling?   Yes    Is the patient reluctant to leave their home because of a fear of falling?   Yes      [place] residence    Living Arrangements  Spouse/significant other    Available Help at Discharge  Family    Type of [place] to enter    Entrance Stairs-Number of Steps  4    Entrance Stairs-Rails  Cannot reach both    [place];Laundry or work area in basement;Able to live on main level with bedroom/bathroom    Alternate Therapist, sports of Steps  12    Alternate Level Stairs-Rails  -- pt doesn't go upstairs    [place] - quad;Walker - 4 wheels;Grab bars - tub/shower      Prior Function   Level of Independence  Independent    Vocation  Retired    Leisure  Going to the movie      Cognition   Overall Cognitive Status  Within Functional Limits for tasks assessed       Sensation   Light Touch  Appears Intact    Additional Comments  Pt denied N/T.      Posture/Postural Control   Posture/Postural Control  Postural limitations    Postural Limitations  Forward head;Rounded Shoulders      ROM / Strength  AROM / PROM / Strength  AROM;Strength      AROM   Overall AROM   Within functional limits for tasks performed    Overall AROM Comments  BUE/LE AROM WNL      Strength   Overall Strength  Deficits    Overall Strength Comments  BUE strength WNL. BLE: hip flex: 4/5, knee ext: 4/5, knee flex: 4-/5, ankle DF: 4/5. B hip abd/add seated: 4/5. B hip ext weakness suspected 2/2 gait deviations. Pt fatigues during amb.       Transfers   Transfers  Sit to Stand    Sit to Stand  5: Supervision;Without upper extremity assist;From chair/3-in-1    Sit to Stand Details (indicate cue type and reason)  Pt required several attempts to stand without hands.     Stand to Sit  5: Supervision;Without upper extremity assist;To chair/3-in-1      Ambulation/Gait   Ambulation/Gait  Yes    Ambulation/Gait Assistance  5: Supervision    Ambulation/Gait Assistance Details  S to ensure safety. Pt verbalized she's trying to walk "heels first".  SaO2 on 2L of O2 after 2 mintue rest break after amb: 95%, HR: 54bpm.    Ambulation Distance (Feet)  100 Feet    Assistive device  Rollator    Gait Pattern  Step-through pattern;Decreased stride length;Decreased trunk rotation    Ambulation Surface  Level;Indoor    Gait velocity  1.62ft/sec. with rollator      Dynamic Gait Index   Level Surface  Mild Impairment    Change in Gait Speed  Moderate Impairment    Gait with Horizontal Head Turns  Mild Impairment    Gait with Vertical Head Turns  Mild Impairment    Gait and Pivot Turn  Moderate Impairment    Step Over Obstacle  Moderate Impairment    Step Around Obstacles  Mild Impairment    Steps  Moderate Impairment ascend step through, step to to descend    Total Score  12    DGI  comment:  12/24: indicates pt is at high falls risk.       Timed Up and Go Test   TUG  Normal TUG    Normal TUG (seconds)  15.01 with rollator                Objective measurements completed on examination: See above findings.              PT Education - 01/25/18 0945    Education provided  Yes    Education Details  PT discussed outcome measure results, POC, frequency, and duration.     Person(s) Educated  Patient    Methods  Explanation    Comprehension  Verbalized understanding       PT Short Term Goals - 01/25/18 0955      PT SHORT TERM GOAL #1   Title  Pt will be IND in HEP to improve balance, endurance, strength, and flexibility. TARGET DATE FOR ALL STGS: 02/22/18    Status  New      PT SHORT TERM GOAL #2   Title  Pt will improve DGI score to >/=16/24 to decr. falls risk.     Status  New      PT SHORT TERM GOAL #3   Title  Pt will improve gait speed with LRAD, not holding SpO2 machine to >/=2.62 ft/sec. to safely amb. in the community.     Status  New      PT  SHORT TERM GOAL #4   Title  Pt will report zero falls over the last 2 weeks to improve safety.     Status  New      PT SHORT TERM GOAL #5   Title  Perform 6MWT and write goals as indicated.     Status  New      PT SHORT TERM GOAL #6   Title  Pt will amb. 500' with LRAD, not carrying SpO2 machine, at MOD I level to improve safety during functional mobility.     Status  New        PT Long Term Goals - 01/25/18 0956      PT LONG TERM GOAL #1   Title  Pt will verbalize understanding fall prevention strategies to reduce falls risk. TARGET DATE FOR ALL LTGS: 03/22/18    Status  New      PT LONG TERM GOAL #2   Title  Pt will improve DGI score to >/=20/24 to decr. falls risk.     Status  New      PT LONG TERM GOAL #3   Title  Pt will amb. 600' over even/uneven terrain with LRAD, at MOD I level, including traversing curbs to improve safety during functional mobility.     Status  New       PT LONG TERM GOAL #4   Title  Pt will amb. 100', at MOD I level (slower speed), over even terrain to amb. safely at home.     Status  New             Plan - 01/25/18 0946    Clinical Impression Statement  Pt is a pleasant 84 y/o female presenting to [facility] neuro for frequent falls and physical deconitioning. Pt is familiar to this clinic, as she was seen earlier this year but had to take a break 2/2 bronchitis and testing of lung nodule (still undergoing testing). Pt's PMH is significant for the following: 2L of SpO2, osteoporosis, COPD, aortic valve replacement, HTN, HLD, DM, depression, diverticulosis, incontinence, glaucoma, 02/2011: 40-50% carotid stenosis, emphysema, CKD stage 3, CAD, hiatal hernia. Pt's DGI score and TUG time indicate pt is at a high risk for falls. Pt's gait speed with rollator is just at the cut off for falls risk at 1.32ft/sec. The following deficits were noted during exam: gait deviations, decr. strength, poor endurance, and impaired balance. Pt would benefit from skilled PT to improve safety during functional mobility.     History and Personal Factors relevant to plan of care:  Pt lives in a multi-story house and fear of falls is limiting her QOL    Clinical Presentation  Evolving    Clinical Presentation due to:  2L of SpO2, osteoporosis, COPD, aortic valve replacement, HTN, HLD, DM, depression, diverticulosis, incontinence, glaucoma, 02/2011: 40-50% carotid stenosis, emphysema, CKD stage 3, CAD, hiatal hernia, undergoing testing for lung nodule    Clinical Decision Making  Moderate    Rehab Potential  Good    Clinical Impairments Affecting Rehab Potential  see above    PT Frequency  2x / week    PT Duration  8 weeks    PT Treatment/Interventions  ADLs/Self Care Home Management;Biofeedback;Therapeutic exercise;Manual techniques;Therapeutic activities;Functional mobility training;Stair training;Gait training;Patient/family education;Orthotic Fit/Training;DME  Instruction;Neuromuscular re-education;Balance training;Vestibular    PT Next Visit Plan  Perform 6MWT and write STG and LTGs. Review previous HEP and modify as indicated.     Consulted and Agree with Plan of Care  Patient  Patient will benefit from skilled therapeutic intervention in order to improve the following deficits and impairments:  Abnormal gait, Decreased endurance, Decreased knowledge of use of DME, Decreased balance, Decreased mobility, Impaired flexibility, Postural dysfunction, Decreased strength  Visit Diagnosis: Other abnormalities of gait and mobility - Plan: PT plan of care cert/re-cert  Muscle weakness (generalized) - Plan: PT plan of care cert/re-cert  Unsteadiness on feet - Plan: PT plan of care cert/re-cert     Problem List Patient Active Problem List   Diagnosis Date Noted  . Pulmonary nodule 12/03/2017  . Iron deficiency anemia   . Heme + stool   . CAD (coronary artery disease) 10/27/2017  . Microcytic anemia 10/27/2017  . Anemia due to GI blood loss 10/27/2017  . COPD with acute exacerbation ([place]) 10/27/2017  . Pulmonary nodule, left 10/15/2017  . Anemia, chronic disease 12/07/2016  . Chronic obstructive pulmonary disease ([place])   . Acute on chronic respiratory failure with hypoxia ([place]) 11/19/2016  . CKD (chronic kidney disease), stage III ([place]) 11/19/2016  . Diabetes mellitus type 2, uncontrolled ([place]) 11/19/2016  . Recurrent pneumonia 11/19/2016  . Chronic diastolic CHF (congestive heart failure) ([place]) 11/19/2016  . Pulmonary HTN ([place]) 11/19/2016  . HLD (hyperlipidemia) 11/19/2016  . Hiatal hernia 05/01/2016  . History of bacterial pneumonia 05/01/2016  . COPD exacerbation ([place]) 11/18/2015  . Physical deconditioning 07/24/2013  . S/P AVR (aortic valve replacement) 07/23/2013  . S/P CABG x 1 07/23/2013  . Aortic stenosis 06/10/2013  . Essential hypertension 06/10/2013  . COPD GOLD II  10/26/2011    Amadu Schlageter L 01/25/2018, 9:58  AM  [facility] [address] [place], [place], [address] Phone: [phone]   Fax:  [phone]  Name: [name] MRN: [id number] Date of Birth: 21-Sep-1931  Geoffry Paradise, PT,DPT 01/25/18 9:58 AM Phone: [phone] Fax: [phone]

## 2018-01-30 ENCOUNTER — Telehealth: Payer: Self-pay | Admitting: Acute Care

## 2018-01-30 NOTE — Telephone Encounter (Signed)
LM for Raquel Sarna that I faxed OV to the fax number she provided. Nothing further is needed.

## 2018-02-01 ENCOUNTER — Encounter

## 2018-02-04 ENCOUNTER — Encounter: Payer: Self-pay | Admitting: Radiation Oncology

## 2018-02-04 ENCOUNTER — Ambulatory Visit
Admission: RE | Admit: 2018-02-04 | Discharge: 2018-02-04 | Disposition: A | Discharge status: Home / Self Care | Payer: Medicare Other | Source: Ambulatory Visit | Attending: Radiation Oncology | Admitting: Radiation Oncology

## 2018-02-04 ENCOUNTER — Other Ambulatory Visit: Payer: Self-pay

## 2018-02-04 VITALS — BP 157/63 | HR 51 | Temp 97.8°F | Resp 20 | Wt 102.4 lb

## 2018-02-04 DIAGNOSIS — R1310 Dysphagia, unspecified: Secondary | ICD-10-CM | POA: Diagnosis not present

## 2018-02-04 DIAGNOSIS — R911 Solitary pulmonary nodule: Secondary | ICD-10-CM | POA: Insufficient documentation

## 2018-02-04 DIAGNOSIS — Z79899 Other long term (current) drug therapy: Secondary | ICD-10-CM | POA: Insufficient documentation

## 2018-02-04 DIAGNOSIS — Z923 Personal history of irradiation: Secondary | ICD-10-CM | POA: Insufficient documentation

## 2018-02-04 NOTE — Progress Notes (Addendum)
[name] is here for her follow-up appointment. Denies any pain ,but states that she has vertigo. States that she has moderate fatigue.States that she gets shortness of breath with activity. States that she has difficulty swallowing her pills sometime. States that she coughs up phlegm sometime. States that her appetite is ok. Denies any skin issues.Patient is on 2 liters of oxygen. Vitals:   02/04/18 1518  BP: (!) 157/63  Pulse: (!) 51  Resp: 20  Temp: 97.8 F (36.6 C)  TempSrc: Oral  SpO2: 99%  Weight: 102 lb 6 oz (46.4 kg)   Wt Readings from Last 3 Encounters:  02/04/18 102 lb 6 oz (46.4 kg)  12/24/17 100 lb 9.6 oz (45.6 kg)  12/12/17 101 lb (45.8 kg)

## 2018-02-04 NOTE — Progress Notes (Signed)
Patient verbalizes feeling dizzy, offer  To give her a ride via wheelchair to [facility] parking patient refused.

## 2018-02-04 NOTE — Progress Notes (Signed)
Radiation Oncology         [phone] ________________________________  Name: [name] MRN: [id number]  Date: 02/04/2018  DOB: September 05, 1932  Follow-Up Visit Note  CC: Harlan Stains, MD  Brand Males, MD    ICD-10-CM   1. Pulmonary nodule, left R91.1     Diagnosis: Presumptive clinical stage 1 non-small cell lung cancerpresentingin the left lower lung  Interval Since Last Radiation:  1 month 5 days  Radiation treatment dates: 12/21/17-12/31/17  Site/dose: Left lung/ 50 Gy in 5 fractions  Narrative:  The patient returns today for routine follow-up. She is doing well overall. She is on 2 liters of oxygen.  Patient was admitted to ED on 01/13/18 for a fall without loss of consciousness with a CT Head that was negative.   On review of systems, patient notes trouble swallowing pills that is recurrent and fatigue. Patient denies blood in sputum and any other symptoms.    ALLERGIES:  is allergic to lipitor [atorvastatin]; lisinopril; amaryl [glimepiride]; avelox [moxifloxacin hcl in nacl]; ciprofloxacin; other; prevacid [lansoprazole]; spiriva handihaler [tiotropium bromide monohydrate]; and symbicort [budesonide-formoterol fumarate].  Meds: Current Outpatient Medications  Medication Sig Dispense Refill  . acetaminophen (TYLENOL) 500 MG tablet Take 500 mg by mouth every 6 (six) hours as needed for headache.    . albuterol (PROVENTIL) (2.5 MG/3ML) 0.083% nebulizer solution Take 3 mLs (2.5 mg total) by nebulization every 6 (six) hours as needed for wheezing. 75 mL 0  . alendronate (FOSAMAX) 70 MG tablet Take 70 mg by mouth once a week. Take with a full glass of water on an empty stomach.    [name] amLODipine (NORVASC) 10 MG tablet Take 1 tablet (10 mg total) by mouth daily. 90 tablet 3  . aspirin EC 81 MG tablet Take 81 mg by mouth at bedtime.    [name] buPROPion (WELLBUTRIN XL) 150 MG 24 hr tablet Take 150 mg by mouth daily.  2  . cholecalciferol (VITAMIN D) 1000 units tablet  Take 1,000 Units by mouth at bedtime.     Mariane Baumgarten Sodium (COLACE PO) Take by mouth 2 (two) times daily.    . Ferrous Sulfate (IRON) 325 (65 Fe) MG TABS Take by mouth 2 (two) times daily.    . fluticasone (FLOVENT HFA) 220 MCG/ACT inhaler Inhale 2 puffs into the lungs 2 (two) times daily.    . furosemide (LASIX) 40 MG tablet Take 0.5 tablets (20 mg total) by mouth every other day.    . irbesartan (AVAPRO) 150 MG tablet Take 1 tablet (150 mg total) by mouth daily. 90 tablet 3  . Magnesium 250 MG TABS Take 250 mg by mouth at bedtime.     . Multiple Vitamins-Minerals (OCUVITE-LUTEIN PO) Take 1 tablet by mouth every morning.     . pravastatin (PRAVACHOL) 40 MG tablet Take 40 mg by mouth every evening.     [name] PROAIR HFA 108 (90 BASE) MCG/ACT inhaler Inhale 2 puffs into the lungs 4 (four) times daily as needed for wheezing or shortness of breath.     . sertraline (ZOLOFT) 100 MG tablet Take 1 tablet (100 mg total) by mouth every morning. 30 tablet 0   No current facility-administered medications for this encounter.     Physical Findings: The patient is in no acute distress. Patient is alert and oriented.  weight is 102 lb 6 oz (46.4 kg). Her oral temperature is 97.8 F (36.6 C). Her blood pressure is 157/63 (abnormal) and her pulse is 51 (abnormal). Her  respiration is 20 and oxygen saturation is 99%. .  No significant changes.   Lungs are clear to auscultation bilaterally. Heart has regular rate and rhythm. No palpable cervical, supraclavicular, or axillary adenopathy. Abdomen soft, non-tender, normal bowel sounds.   Lab Findings: Lab Results  Component Value Date   WBC 9.5 10/29/2017   HGB 7.8 (L) 10/29/2017   HCT 26.5 (L) 10/29/2017   MCV 76.6 (L) 10/29/2017   PLT 282 10/29/2017    Radiographic Findings: Ct Head Wo Contrast  Result Date: 01/13/2018 CLINICAL DATA:  Patient status post fall. No reported loss of consciousness. EXAM: CT HEAD WITHOUT CONTRAST TECHNIQUE: Contiguous axial  images were obtained from the base of the skull through the vertex without intravenous contrast. COMPARISON:  Brain CT 10/16/2017. FINDINGS: Brain: Ventricles and sulci are prominent compatible with atrophy. Periventricular and subcortical white matter hypodensity compatible with chronic microvascular ischemic changes. No evidence for acute cortically based infarct, intracranial hemorrhage, mass lesion or mass-effect. Vascular: Internal carotid arterial vascular calcifications. Skull: Intact.  No displaced skull fracture. Sinuses/Orbits: Mucosal thickening left maxillary sinus. Mastoid air cells unremarkable. Orbits are unremarkable. Other: Soft tissue thickening overlying the posterior right calvarium compatible with soft tissue injury. IMPRESSION: No acute intracranial process. Atrophy and chronic microvascular ischemic changes. Electronically Signed   By: Lovey Newcomer M.D.   On: 01/13/2018 21:39     Impression: The patient is recovering from the effects of radiation.  No obvious side effects besides mild fatigue.  Plan: Will obtain a baseline chest CT Scan, then routine follow up in 6 months.  ____________________________________ -----------------------------------  Blair Promise, PhD, MD    This document serves as a record of services personally performed by Gery Pray, MD. It was created on his behalf by Wilburn Mylar, a trained medical scribe. The creation of this record is based on the scribe's personal observations and the provider's statements to them. This document has been checked and approved by the attending provider.

## 2018-02-05 ENCOUNTER — Ambulatory Visit: Payer: Medicare Other

## 2018-02-05 DIAGNOSIS — R2689 Other abnormalities of gait and mobility: Secondary | ICD-10-CM | POA: Diagnosis not present

## 2018-02-05 DIAGNOSIS — R42 Dizziness and giddiness: Secondary | ICD-10-CM

## 2018-02-05 DIAGNOSIS — R2681 Unsteadiness on feet: Secondary | ICD-10-CM

## 2018-02-05 DIAGNOSIS — M6281 Muscle weakness (generalized): Secondary | ICD-10-CM

## 2018-02-05 NOTE — Therapy (Signed)
[facility] [address] [place], [place], [address] Phone: [phone]   Fax:  [phone]  Physical Therapy Treatment  Patient Details  Name: [name] MRN: [id number] Date of Birth: [birth year] Referring Provider: Dr. Dema Severin   Encounter Date: 02/05/2018  PT End of Session - 02/05/18 1016    Visit Number  2    Number of Visits  17    Date for PT Re-Evaluation  03/26/18    Authorization Type  [employer] Medicare    PT Start Time  0931    PT Stop Time  1013    PT Time Calculation (min)  42 min    Activity Tolerance  Patient tolerated treatment well    Behavior During Therapy  [facility] for tasks assessed/performed       Past Medical History:  Diagnosis Date  . Anxiety   . Aortic stenosis   . Arthritis   . Asthma    uses inhaler as needed  . Depression   . Diabetes mellitus   . Emphysema of lung ([place])   . GERD (gastroesophageal reflux disease)   . Glaucoma   . Hyperlipidemia   . Hypertension   . [name]   . Murmur   . Nephrolithiasis   . Pneumonia 3/17 and 3/18    Past Surgical History:  Procedure Laterality Date  . ABDOMINAL HYSTERECTOMY    . ANKLE SURGERY  1998   d/t fx.  Left ankle  . ANTERIOR AND POSTERIOR REPAIR  06/21/2011   Procedure: ANTERIOR (CYSTOCELE) AND POSTERIOR REPAIR (RECTOCELE);  Surgeon: Maisie Fus;  Location: [facility];  Service: Gynecology;  Laterality: N/A;  . AORTIC VALVE REPLACEMENT N/A 07/17/2013   Procedure: AORTIC VALVE REPLACEMENT (AVR);  Surgeon: Ivin Poot, MD;  Location: [facility];  Service: Open Heart Surgery;  Laterality: N/A;  . CARDIAC CATHETERIZATION    . CARDIAC VALVE REPLACEMENT    . CORONARY ANGIOPLASTY    . CORONARY ARTERY BYPASS GRAFT N/A 07/17/2013   Procedure: CORONARY ARTERY BYPASS GRAFT, TIMES ONE, ON PUMP, USING RIGHT INTERNAL MAMMARY ARTERY.;  Surgeon: Ivin Poot, MD;  Location: [facility];  Service: Open Heart Surgery;  Laterality: N/A;   . FACIAL COSMETIC SURGERY     face lift  . INTRAOPERATIVE TRANSESOPHAGEAL ECHOCARDIOGRAM N/A 07/17/2013   Procedure: INTRAOPERATIVE TRANSESOPHAGEAL ECHOCARDIOGRAM;  Surgeon: Ivin Poot, MD;  Location: [facility];  Service: Open Heart Surgery;  Laterality: N/A;  . LAPAROSCOPIC ASSISTED VAGINAL HYSTERECTOMY  06/21/2011   Procedure: LAPAROSCOPIC ASSISTED VAGINAL HYSTERECTOMY;  Surgeon: Maisie Fus;  Location: [facility];  Service: Gynecology;  Laterality: N/A;  . LEFT AND RIGHT HEART CATHETERIZATION WITH CORONARY ANGIOGRAM N/A 06/27/2013   Procedure: LEFT AND RIGHT HEART CATHETERIZATION WITH CORONARY ANGIOGRAM;  Surgeon: Ramond Dial, MD;  Location: [facility] CATH [facility];  Service: Cardiovascular;  Laterality: N/A;  . NOSE SURGERY    . SALPINGOOPHORECTOMY  06/21/2011   Procedure: SALPINGO OOPHERECTOMY;  Surgeon: Maisie Fus;  Location: [facility];  Service: Gynecology;  Laterality: Bilateral;  . VAGINAL PROLAPSE REPAIR  06/21/2011   Procedure: SACROPEXY;  Surgeon: Maisie Fus;  Location: [facility];  Service: Gynecology;  Laterality: N/A;  sacrospinous ligament suspension    There were no vitals filed for this visit.  Subjective Assessment - 02/05/18 0936    Subjective  Pt reported she almost cancelled today 2/2 vertigo, MD is aware. Per pt and MD pt has presumptive lung CA with recent bout  of radiation. Pt is awaiting an appt for an x-ray. Pt states she caught herself the last 3-4 days bouncing off walls 2/2 dizziness. Pt describes dizziness as a spinning sensation and sits on EOB to allow it to subside. Pt has performed previous HEP almost every day since last visit.     Pertinent History  2L of SpO2, osteoporosis, COPD, aortic valve replacement, HTN, HLD, DM, depression, diverticulosis, incontinence, glaucoma, 02/2011: 40-50% carotid stenosis, emphysema, CKD stage 3, CAD, hiatal hernia    Patient Stated Goals  She'd love to stop using SpO2 as it is heavy, she'd like to walk without an AD.    Currently in  Pain?  No/denies             Vestibular Assessment - 02/05/18 0938      Symptom Behavior   Type of Dizziness  Spinning    Frequency of Dizziness  All day, worse in the morning    Duration of Dizziness  All day (last 3-4 days)    Aggravating Factors  Supine to sit;Sit to stand;Lying supine    Relieving Factors  Rest      Occulomotor Exam   Occulomotor Alignment  Normal    Spontaneous  Absent    Gaze-induced  Absent    Smooth Pursuits  Saccades Saccades noted to L side with dizziness reported    Saccades  Slow to L side but R side WNL and no dizziness     Comment  Pt reported dizziness during R sided HIT exam with 1 saccade noted. L HIT: one saccade noted but pt denied dizziness.       Vestibulo-Occular Reflex   VOR 1 Head Only (x 1 viewing)  Pt had difficulty performing 2/2 fear of dizziness.     VOR Cancellation  Normal      Positional Testing   Dix-Hallpike  Dix-Hallpike Right;Dix-Hallpike Left    Horizontal Canal Testing  Horizontal Canal Right;Horizontal Canal Left      Dix-Hallpike Right   Dix-Hallpike Right Duration  none     Dix-Hallpike Right Symptoms  No nystagmus      Dix-Hallpike Left   Dix-Hallpike Left Duration  Slight dizziness, for entire time pt in Cx ext/rot    Dix-Hallpike Left Symptoms  No nystagmus      Horizontal Canal Right   Horizontal Canal Right Duration  none    Horizontal Canal Right Symptoms  Normal      Horizontal Canal Left   Horizontal Canal Left Duration  none    Horizontal Canal Left Symptoms  Normal      Positional Sensitivities   Supine to Left Side  Lightheadedness    Supine to Right Side  Lightheadedness      Orthostatics   BP supine (x 5 minutes)  142/72 no dizziness lying down    BP sitting  140/78 pt reported lightheadedness (7/10) upon sitting     BP standing (after 1 minute)  135/70 lightheadedness (6/10)    Orthostatics Comment  BP did not decr. significantly (taken manually as machine did not inflate sufficiently),  however, pt reported lightheadedness.                       PT Education - 02/05/18 1016    Education provided  Yes    Education Details  Discussed vestibular exam findings and safety during txfs.     Person(s) Educated  Patient    Methods  Explanation    Comprehension  Verbalized understanding       PT Short Term Goals - 01/25/18 0955      PT SHORT TERM GOAL #1   Title  Pt will be IND in HEP to improve balance, endurance, strength, and flexibility. TARGET DATE FOR ALL STGS: 02/22/18    Status  New      PT SHORT TERM GOAL #2   Title  Pt will improve DGI score to >/=16/24 to decr. falls risk.     Status  New      PT SHORT TERM GOAL #3   Title  Pt will improve gait speed with LRAD, not holding SpO2 machine to >/=2.62 ft/sec. to safely amb. in the community.     Status  New      PT SHORT TERM GOAL #4   Title  Pt will report zero falls over the last 2 weeks to improve safety.     Status  New      PT SHORT TERM GOAL #5   Title  Perform 6MWT and write goals as indicated.     Status  New      PT SHORT TERM GOAL #6   Title  Pt will amb. 500' with LRAD, not carrying SpO2 machine, at MOD I level to improve safety during functional mobility.     Status  New        PT Long Term Goals - 01/25/18 0956      PT LONG TERM GOAL #1   Title  Pt will verbalize understanding fall prevention strategies to reduce falls risk. TARGET DATE FOR ALL LTGS: 03/22/18    Status  New      PT LONG TERM GOAL #2   Title  Pt will improve DGI score to >/=20/24 to decr. falls risk.     Status  New      PT LONG TERM GOAL #3   Title  Pt will amb. 600' over even/uneven terrain with LRAD, at MOD I level, including traversing curbs to improve safety during functional mobility.     Status  New      PT LONG TERM GOAL #4   Title  Pt will amb. 100', at MOD I level (slower speed), over even terrain to amb. safely at home.     Status  New            Plan - 02/05/18 1017    Clinical  Impression Statement  Today's skilled session focused on vestibular assessment 2/2 pt reports of dizziness and has hx of dizziness. Positional testing was negative. Pt experienced concordant lightheadedness during orthostatic testing but BP did not decr. significantly. Pt's occulomotor exam findings consistent with vestibular hypofunction, therefore, PT will resend cert to MD to address dizziness. PT will continue to monitor pt's s/s and notify MD if changes occur. Pt would conitnue to benefit from skilled PT to improve safety during functional mobility.    Rehab Potential  Good    Clinical Impairments Affecting Rehab Potential  see above    PT Frequency  2x / week    PT Duration  8 weeks    PT Treatment/Interventions  ADLs/Self Care Home Management;Biofeedback;Therapeutic exercise;Manual techniques;Therapeutic activities;Functional mobility training;Stair training;Gait training;Patient/family education;Orthotic Fit/Training;DME Instruction;Neuromuscular re-education;Balance training;Vestibular;Canalith Repostioning    PT Next Visit Plan  Perform 6MWT and write STG and LTGs. Review previous HEP and modify as indicated. Provide pt with vestibular hypofunctional exercises (including VOR).     Consulted and Agree with Plan of Care  Patient  Patient will benefit from skilled therapeutic intervention in order to improve the following deficits and impairments:  Abnormal gait, Decreased endurance, Decreased knowledge of use of DME, Decreased balance, Decreased mobility, Impaired flexibility, Postural dysfunction, Decreased strength, Dizziness  Visit Diagnosis: Other abnormalities of gait and mobility - Plan: PT plan of care cert/re-cert  Dizziness and giddiness - Plan: PT plan of care cert/re-cert  Unsteadiness on feet - Plan: PT plan of care cert/re-cert  Muscle weakness (generalized) - Plan: PT plan of care cert/re-cert     Problem List Patient Active Problem List   Diagnosis Date Noted   . Pulmonary nodule 12/03/2017  . Iron deficiency anemia   . Heme + stool   . CAD (coronary artery disease) 10/27/2017  . Microcytic anemia 10/27/2017  . Anemia due to GI blood loss 10/27/2017  . COPD with acute exacerbation ([place]) 10/27/2017  . Pulmonary nodule, left 10/15/2017  . Anemia, chronic disease 12/07/2016  . Chronic obstructive pulmonary disease ([place])   . Acute on chronic respiratory failure with hypoxia ([place]) 11/19/2016  . CKD (chronic kidney disease), stage III ([place]) 11/19/2016  . Diabetes mellitus type 2, uncontrolled ([place]) 11/19/2016  . Recurrent pneumonia 11/19/2016  . Chronic diastolic CHF (congestive heart failure) ([place]) 11/19/2016  . Pulmonary HTN ([place]) 11/19/2016  . HLD (hyperlipidemia) 11/19/2016  . Hiatal hernia 05/01/2016  . History of bacterial pneumonia 05/01/2016  . COPD exacerbation ([place]) 11/18/2015  . Physical deconditioning 07/24/2013  . S/P AVR (aortic valve replacement) 07/23/2013  . S/P CABG x 1 07/23/2013  . Aortic stenosis 06/10/2013  . Essential hypertension 06/10/2013  . COPD GOLD II  10/26/2011    Autumn Pruitt L 02/05/2018, 10:55 AM  [facility] [address] [place], [place], [address] Phone: [phone]   Fax:  [phone]  Name: MISAKO ROEDER MRN: [id number] Date of Birth: August 21, 1932  Geoffry Paradise, PT,DPT 02/05/18 10:56 AM Phone: [phone] Fax: [phone]

## 2018-02-06 ENCOUNTER — Telehealth: Payer: Self-pay | Admitting: *Deleted

## 2018-02-06 ENCOUNTER — Ambulatory Visit: Payer: Medicare Other | Admitting: Internal Medicine

## 2018-02-06 ENCOUNTER — Encounter: Payer: Self-pay | Admitting: Internal Medicine

## 2018-02-06 ENCOUNTER — Ambulatory Visit (INDEPENDENT_AMBULATORY_CARE_PROVIDER_SITE_OTHER): Payer: Medicare Other | Admitting: Internal Medicine

## 2018-02-06 VITALS — BP 144/70 | HR 68 | Ht 59.0 in | Wt 102.0 lb

## 2018-02-06 DIAGNOSIS — J449 Chronic obstructive pulmonary disease, unspecified: Secondary | ICD-10-CM

## 2018-02-06 DIAGNOSIS — J9611 Chronic respiratory failure with hypoxia: Secondary | ICD-10-CM

## 2018-02-06 LAB — PULMONARY FUNCTION TEST
DL/VA % pred: 38 %
DL/VA: 1.57 ml/min/mmHg/L
DLCO UNC % PRED: 29 %
DLCO UNC: 5.13 ml/min/mmHg
FEF 25-75 PRE: 0.33 L/s
FEF 25-75 Post: 0.4 L/sec
FEF2575-%Change-Post: 20 %
FEF2575-%PRED-PRE: 34 %
FEF2575-%Pred-Post: 41 %
FEV1-%CHANGE-POST: 12 %
FEV1-%PRED-POST: 68 %
FEV1-%Pred-Pre: 60 %
FEV1-POST: 0.93 L
FEV1-Pre: 0.83 L
FEV1FVC-%Change-Post: 2 %
FEV1FVC-%Pred-Pre: 63 %
FEV6-%Change-Post: 5 %
FEV6-%Pred-Post: 104 %
FEV6-%Pred-Pre: 98 %
FEV6-PRE: 1.73 L
FEV6-Post: 1.83 L
FEV6FVC-%CHANGE-POST: -2 %
FEV6FVC-%Pred-Post: 100 %
FEV6FVC-%Pred-Pre: 103 %
FVC-%Change-Post: 8 %
FVC-%Pred-Post: 103 %
FVC-%Pred-Pre: 94 %
FVC-Post: 1.93 L
FVC-Pre: 1.78 L
POST FEV1/FVC RATIO: 48 %
POST FEV6/FVC RATIO: 95 %
PRE FEV6/FVC RATIO: 97 %
Pre FEV1/FVC ratio: 47 %
RV % PRED: 51 %
RV: 1.14 L
TLC % PRED: 66 %
TLC: 2.85 L

## 2018-02-06 MED ORDER — UMECLIDINIUM-VILANTEROL 62.5-25 MCG/INH IN AEPB: 1.0000 | INHALATION_SPRAY | Freq: Every day | RESPIRATORY_TRACT | 0 refills | Status: DC

## 2018-02-06 NOTE — Patient Instructions (Signed)
ICD-10-CM   1. COPD GOLD II  J44.9   2. Chronic respiratory failure with hypoxia (HCC) J96.11     Stable disease  PFts show need for o2 and inhalers with your copd and suggest you could do better with adding an inhaler to flovent  Undderstand spirivan and symbicort caused cough but if this was mild cough after you took inhalers and transient that is ok  Plan Continue o2 and flovent schedule Add and start ANORO daily - show technique (transient cough after taking inhaler is okay) Flu shot in fall  Followup 6 months or soner. CAT Score at followup

## 2018-02-06 NOTE — Telephone Encounter (Signed)
CALLED PATIENT TO INFORM OF CT FOR 02-12-18- ARRIVAL TIME - 2:15 PM @ [facility], NO RESTRICTIONS TO TEST, AND PAT. TO HAVE FU ON 08-08-18 @ 4 PM WITH DR. KINARD, SPOKE WITH PATIENT AND SHE IS AWARE OF THESE APPTS.

## 2018-02-06 NOTE — Progress Notes (Signed)
PFT done today. 

## 2018-02-06 NOTE — Progress Notes (Signed)
Patient seen in the office today and instructed on use of anoro.  Patient expressed understanding and demonstrated technique.

## 2018-02-06 NOTE — Progress Notes (Signed)
Subjective:     Patient ID: [name], female   DOB: [birth year], 84 y.o.   MRN: [id number]  HPI OV 05/29/2017  Chief Complaint  Patient presents with  . Follow-up    COPD Gold II. Patient states that she is feeling very weak this morning.    Chronic hypoxemic respiratory failure on oxygen 2 L associated with Gold stage II COPD and large hiatal hernia. She tells me the summer 2018 she had syncopal episodes and then after she started wearing her oxygen on a continuous basis this has not recurred. She is now on [employer] and [employer]. Also in spring 2018 and in 2017 she's had recurrent pneumonias. Most recent chest x-ray July 2018 seem to s show resolution but with some chronic lung changes. Currently she is feeling well. She will have high dose flu shot today    OV 08/16/2017  Chief Complaint  Patient presents with  . Follow-up    Pt fell twice within to days dizzy outdoors and hit her back of the head twice in Oct 2018. Pt has pain on the left side affecting her breathing from the fall. Pt states not breathing well in last three weeks.   84 year old female with chronic COPD and chronic hypoxemic respiratory failure on 2 L oxygen.  She has abnormal chest x-ray on account of pneumonia in 2017 CT scan of the chest and a chest x-ray 1 in 2018 was abnormal.  Overall she feels a COPD stable with a COPD Score of 15 as documented below.  Most recently since her last visit in the last few to several weeks she has been having falls and imbalance issues.  She feels these falls are not related to oxygen but related to balance.  This resulted in scalp laceration and bruises in her hands.  She is now using a cane to walk.  She is up-to-date with her flu shot.  She wants her next follow-up in March 2019 because of fear of pneumonia she does want to have a good evaluation of the lungs with CT scan of the chest.    OV 02/06/2018  Chief Complaint  Patient presents with  . Follow-up    PFT done today.   Pt had a cough x4-5 weeks after saw SG 3/18 but states cough finally went away. Pt still becomes SOB going up and down steps. DME: [employer], 2L pulse.   84 year old female with =-  Gold stage II COPD with severe reduction in diffusion capacity and chronic hypoxemic respiratory failure on 2 L oxygen -Setting of large hiatal hernia -Setting of hyper already left lower lobe nodule status post radiation therapy ending in April 2019 -Setting of frequent mechanical falls last April 2019 -Setting of low body mass index  [name] presents for follow-up.  Overall she is doing stable with her COPD.  She is on oxygen and Flovent.  She says she cannot take other inhalers because of cough.  When I asked her more questions she could not remember the inhaler.  Medicine review shows cough happened after Spiriva and Symbicort.  She suspected it was just a transient cough after she took the inhaler.  She does not recollect sustained increases and cough for the whole day.  There is no fever or chills nausea vomiting or hemoptysis.  And in the last 2 months he has not fallen.  In terms of her cancer chemotherapy therapy there is no chemotherapy.  She did just get empiric radiation 5 treatments ending April  but apparently radiation oncology wants to give her some more treatments now.  She is due for a follow-up CT scan.  Overall she is willing to try another inhaler to improve her symptoms because she had significant bronchodilator response on a PFT today.   CAT COPD Symptom & Quality of Life Score (GSK trademark) 0 is no burden. 5 is highest burden 08/16/2017   Never Cough -> Cough all the time 2  No phlegm in chest -> Chest is full of phlegm 2  No chest tightness -> Chest feels very tight 0  No dyspnea for 1 flight stairs/hill -> Very dyspneic for 1 flight of stairs 4  No limitations for ADL at home -> Very limited with ADL at home 3  Confident leaving home -> Not at all confident leaving home 1  Sleep  soundly -> Do not sleep soundly because of lung condition 1  Lots of Energy -> No energy at all 3  TOTAL Score (max 40)  15    Results for [name], [name] (MRN [id number]) as of 02/06/2018 10:38 Results for [name], [name] (MRN [id number]) as of 02/06/2018 10:38  Ref. Range 11/07/2017 13:50  FEV1-Post Latest Units: L 0.93  FEV1-%Pred-Post Latest Units: % 68  FEV1-%Change-Post Latest Units: % 12   Results for [name], [name] (MRN [id number]) as of 02/06/2018 10:38  Ref. Range 11/07/2017 13:50  DLCO unc Latest Units: ml/min/mmHg 5.13  DLCO unc % pred Latest Units: % 29     [name], Aortic stenosis, Arthritis, Asthma, Depression, Diabetes mellitus, Emphysema of lung ([place]), GERD (gastroesophageal reflux disease), Glaucoma, Hyperlipidemia, Hypertension, [name], Murmur, Nephrolithiasis, and Pneumonia (3/17 and 3/18).   reports that she quit smoking about 45 years ago. Her smoking use included cigarettes. She has a 30.00 pack-year smoking history. She has never used smokeless tobacco.  Past Surgical History:  Procedure Laterality Date  . ABDOMINAL HYSTERECTOMY    . ANKLE SURGERY  1998   d/t fx.  Left ankle  . ANTERIOR AND POSTERIOR REPAIR  06/21/2011   Procedure: ANTERIOR (CYSTOCELE) AND POSTERIOR REPAIR (RECTOCELE);  Surgeon: Maisie Fus;  Location: [facility];  Service: Gynecology;  Laterality: N/A;  . AORTIC VALVE REPLACEMENT N/A 07/17/2013   Procedure: AORTIC VALVE REPLACEMENT (AVR);  Surgeon: Ivin Poot, MD;  Location: [facility];  Service: Open Heart Surgery;  Laterality: N/A;  . CARDIAC CATHETERIZATION    . CARDIAC VALVE REPLACEMENT    . CORONARY ANGIOPLASTY    . CORONARY ARTERY BYPASS GRAFT N/A 07/17/2013   Procedure: CORONARY ARTERY BYPASS GRAFT, TIMES ONE, ON PUMP, USING RIGHT INTERNAL MAMMARY ARTERY.;  Surgeon: Ivin Poot, MD;  Location: [facility];  Service: Open Heart Surgery;  Laterality: N/A;  . FACIAL COSMETIC SURGERY     face  lift  . INTRAOPERATIVE TRANSESOPHAGEAL ECHOCARDIOGRAM N/A 07/17/2013   Procedure: INTRAOPERATIVE TRANSESOPHAGEAL ECHOCARDIOGRAM;  Surgeon: Ivin Poot, MD;  Location: [facility];  Service: Open Heart Surgery;  Laterality: N/A;  . LAPAROSCOPIC ASSISTED VAGINAL HYSTERECTOMY  06/21/2011   Procedure: LAPAROSCOPIC ASSISTED VAGINAL HYSTERECTOMY;  Surgeon: Maisie Fus;  Location: [facility];  Service: Gynecology;  Laterality: N/A;  . LEFT AND RIGHT HEART CATHETERIZATION WITH CORONARY ANGIOGRAM N/A 06/27/2013   Procedure: LEFT AND RIGHT HEART CATHETERIZATION WITH CORONARY ANGIOGRAM;  Surgeon: Ramond Dial, MD;  Location: [facility] C[facility];  Service: Cardiovascular;  Laterality: N/A;  . NOSE SURGERY    . SALPINGOOPHORECTOMY  06/21/2011  Procedure: SALPINGO OOPHERECTOMY;  Surgeon: Maisie Fus;  Location: [facility];  Service: Gynecology;  Laterality: Bilateral;  . VAGINAL PROLAPSE REPAIR  06/21/2011   Procedure: SACROPEXY;  Surgeon: Maisie Fus;  Location: [facility];  Service: Gynecology;  Laterality: N/A;  sacrospinous ligament suspension    Allergies  Allergen Reactions  . Lipitor [Atorvastatin] Other (See Comments)    myalgias myalgias  . Lisinopril Other (See Comments)    cough cough  . Amaryl [Glimepiride]     Side effects  . Avelox [Moxifloxacin Hcl In Nacl]     nausea  . Ciprofloxacin     nausea  . Other Other (See Comments)    Increased cough  . Prevacid [Lansoprazole]     diarrhea  . Spiriva Handihaler [Tiotropium Bromide Monohydrate]     Increased cough  . Symbicort [Budesonide-Formoterol Fumarate] Cough    Increased cough    Immunization History  Administered Date(s) Administered  . Influenza Split 07/19/2014  . Influenza, High Dose Seasonal PF 06/16/2016, 05/29/2017  . Influenza,inj,Quad PF,6+ Mos 06/19/2015  . Pneumococcal Conjugate-13 09/18/2013  . Pneumococcal Polysaccharide-23 05/14/2006  . Tdap 03/04/2013  . Zoster 08/20/2008, 08/18/2014    Family History  Problem  Relation Age of Onset  . Emphysema Father   . Colon cancer Neg Hx   . Esophageal cancer Neg Hx   . Rectal cancer Neg Hx   . Stomach cancer Neg Hx      Current Outpatient Medications:  .  acetaminophen (TYLENOL) 500 MG tablet, Take 500 mg by mouth every 6 (six) hours as needed for headache., Disp: , Rfl:  .  albuterol (PROVENTIL) (2.5 MG/3ML) 0.083% nebulizer solution, Take 3 mLs (2.5 mg total) by nebulization every 6 (six) hours as needed for wheezing., Disp: 75 mL, Rfl: 0 .  alendronate (FOSAMAX) 70 MG tablet, Take 70 mg by mouth once a week. Take with a full glass of water on an empty stomach., Disp: , Rfl:  .  amLODipine (NORVASC) 10 MG tablet, Take 1 tablet (10 mg total) by mouth daily., Disp: 90 tablet, Rfl: 3 .  aspirin EC 81 MG tablet, Take 81 mg by mouth at bedtime., Disp: , Rfl:  .  buPROPion (WELLBUTRIN XL) 150 MG 24 hr tablet, Take 150 mg by mouth daily., Disp: , Rfl: 2 .  cholecalciferol (VITAMIN D) 1000 units tablet, Take 1,000 Units by mouth at bedtime. , Disp: , Rfl:  .  Docusate Sodium (COLACE PO), Take by mouth 2 (two) times daily., Disp: , Rfl:  .  Ferrous Sulfate (IRON) 325 (65 Fe) MG TABS, Take by mouth 2 (two) times daily., Disp: , Rfl:  .  fluticasone (FLOVENT HFA) 220 MCG/ACT inhaler, Inhale 2 puffs into the lungs 2 (two) times daily., Disp: , Rfl:  .  furosemide (LASIX) 40 MG tablet, Take 0.5 tablets (20 mg total) by mouth every other day., Disp: , Rfl:  .  irbesartan (AVAPRO) 150 MG tablet, Take 1 tablet (150 mg total) by mouth daily., Disp: 90 tablet, Rfl: 3 .  Magnesium 250 MG TABS, Take 250 mg by mouth at bedtime. , Disp: , Rfl:  .  Multiple Vitamins-Minerals (OCUVITE-LUTEIN PO), Take 1 tablet by mouth every morning. , Disp: , Rfl:  .  pravastatin (PRAVACHOL) 20 MG tablet, Take 20 mg by mouth daily., Disp: , Rfl:  .  PROAIR HFA 108 (90 BASE) MCG/ACT inhaler, Inhale 2 puffs into the lungs 4 (four) times daily as needed for wheezing or shortness of breath. ,  Disp: ,  Rfl:  .  sertraline (ZOLOFT) 100 MG tablet, Take 1 tablet (100 mg total) by mouth every morning., Disp: 30 tablet, Rfl: 0   Review of Systems     Objective:   Physical Exam Vitals:   02/06/18 0954  BP: (!) 144/70  Pulse: 68  SpO2: 90%  Weight: 102 lb (46.3 kg)  Height: 4\' 11"  (1.499 m)    Estimated body mass index is 20.6 kg/m as calculated from the following:   Height as of this encounter: 4\' 11"  (1.499 m).   Weight as of this encounter: 102 lb (46.3 kg).   General Appearance:   lean bmi. Frail. Pleasant.No distress  Head:    Normocephalic, without obvious abnormality, atraumatic  Eyes:    PERRL - no, conjunctiva/corneas - clear      Ears:    Normal external ear canals, both ears  Nose:   NG tube - no but has [place] o2  Throat:  ETT TUBE - no , OG tube - no  Neck:   Supple,  No enlargement/tenderness/nodules     Lungs:     Clear to auscultation bilaterally, distant  Chest wall:    No deformity  Heart:    S1 and S2 normal, no murmur, CVP - no.  Pressors - no  Abdomen:     Soft, no masses, no organomegaly  Genitalia:    Not done  Rectal:   not done  Extremities:   Extremities- intact     Skin:   Intact in exposed areas .     Neurologic:   Sedation - none -> RASS - na . Moves all 4s - yes. CAM-ICU - neg . Orientation - x3 +         Assessment:       ICD-10-CM   1. COPD GOLD II  J44.9   2. Chronic respiratory failure with hypoxia (HCC) J96.11        Plan:      Stable disease  PFts show need for o2 and inhalers with your copd and suggest you could do better with adding an inhaler to flovent  Undderstand spirivan and symbicort caused cough but if this was mild cough after you took inhalers and transient that is ok  Plan Continue o2 and flovent schedule Add and start ANORO daily - show technique (transient cough after taking inhaler is okay) Flu shot in fall  Followup 6 months or soner. CAT Score at followup   Dr. Brand Males, M.D.,  [facility].C.P Pulmonary and Critical Care Medicine Staff Physician, [place] Director - Interstitial Lung Disease  Program  Pulmonary [place] [facility] [place], [place], [address]  Pager: [phone], If no answer or between  15:00h - 7:00h: call [phone] Telephone: [phone]

## 2018-02-08 ENCOUNTER — Ambulatory Visit: Payer: Medicare Other

## 2018-02-08 VITALS — BP 144/66 | HR 55

## 2018-02-08 DIAGNOSIS — R2681 Unsteadiness on feet: Secondary | ICD-10-CM

## 2018-02-08 DIAGNOSIS — R42 Dizziness and giddiness: Secondary | ICD-10-CM

## 2018-02-08 DIAGNOSIS — R2689 Other abnormalities of gait and mobility: Secondary | ICD-10-CM | POA: Diagnosis not present

## 2018-02-08 DIAGNOSIS — M6281 Muscle weakness (generalized): Secondary | ICD-10-CM

## 2018-02-08 NOTE — Therapy (Signed)
[facility] [name] [place], [place], [address] Phone: [phone]   Fax:  [phone]  Physical Therapy Treatment  Patient Details  Name: [name] MRN: [id number] Date of Birth: 11/24/1931 Referring Provider: Dr. Dema Severin   Encounter Date: 02/08/2018  PT End of Session - 02/08/18 1551    Visit Number  3    Number of Visits  17    Date for PT Re-Evaluation  03/26/18    Authorization Type  [employer] Medicare    PT Start Time  1287    PT Stop Time  1359    PT Time Calculation (min)  42 min    Equipment Utilized During Treatment  -- min guard to S prn    Activity Tolerance  Patient tolerated treatment well;Treatment limited secondary to medical complications (Comment) elevated systolic BP after 6MWT    Behavior During Therapy  [facility] for tasks assessed/performed       Past Medical History:  Diagnosis Date  . Anxiety   . Aortic stenosis   . Arthritis   . Asthma    uses inhaler as needed  . Depression   . Diabetes mellitus   . Emphysema of lung ([place])   . GERD (gastroesophageal reflux disease)   . Glaucoma   . Hyperlipidemia   . Hypertension   . [name]   . Murmur   . Nephrolithiasis   . Pneumonia 3/17 and 3/18    Past Surgical History:  Procedure Laterality Date  . ABDOMINAL HYSTERECTOMY    . ANKLE SURGERY  1998   d/t fx.  Left ankle  . ANTERIOR AND POSTERIOR REPAIR  06/21/2011   Procedure: ANTERIOR (CYSTOCELE) AND POSTERIOR REPAIR (RECTOCELE);  Surgeon: Maisie Fus;  Location: [facility];  Service: Gynecology;  Laterality: N/A;  . AORTIC VALVE REPLACEMENT N/A 07/17/2013   Procedure: AORTIC VALVE REPLACEMENT (AVR);  Surgeon: Ivin Poot, MD;  Location: [facility];  Service: Open Heart Surgery;  Laterality: N/A;  . CARDIAC CATHETERIZATION    . CARDIAC VALVE REPLACEMENT    . CORONARY ANGIOPLASTY    . CORONARY ARTERY BYPASS GRAFT N/A 07/17/2013   Procedure: CORONARY ARTERY BYPASS  GRAFT, TIMES ONE, ON PUMP, USING RIGHT INTERNAL MAMMARY ARTERY.;  Surgeon: Ivin Poot, MD;  Location: [facility];  Service: Open Heart Surgery;  Laterality: N/A;  . FACIAL COSMETIC SURGERY     face lift  . INTRAOPERATIVE TRANSESOPHAGEAL ECHOCARDIOGRAM N/A 07/17/2013   Procedure: INTRAOPERATIVE TRANSESOPHAGEAL ECHOCARDIOGRAM;  Surgeon: Ivin Poot, MD;  Location: [facility];  Service: Open Heart Surgery;  Laterality: N/A;  . LAPAROSCOPIC ASSISTED VAGINAL HYSTERECTOMY  06/21/2011   Procedure: LAPAROSCOPIC ASSISTED VAGINAL HYSTERECTOMY;  Surgeon: Maisie Fus;  Location: [facility];  Service: Gynecology;  Laterality: N/A;  . LEFT AND RIGHT HEART CATHETERIZATION WITH CORONARY ANGIOGRAM N/A 06/27/2013   Procedure: LEFT AND RIGHT HEART CATHETERIZATION WITH CORONARY ANGIOGRAM;  Surgeon: Ramond Dial, MD;  Location: [facility] C[facility];  Service: Cardiovascular;  Laterality: N/A;  . NOSE SURGERY    . SALPINGOOPHORECTOMY  06/21/2011   Procedure: SALPINGO OOPHERECTOMY;  Surgeon: Maisie Fus;  Location: [facility];  Service: Gynecology;  Laterality: Bilateral;  . VAGINAL PROLAPSE REPAIR  06/21/2011   Procedure: SACROPEXY;  Surgeon: Maisie Fus;  Location: [facility];  Service: Gynecology;  Laterality: N/A;  sacrospinous ligament suspension    Vitals:   02/08/18 1342  BP: (!) 144/66  Pulse: (!) 55  After 6MWT  Subjective Assessment - 02/08/18 1322    Subjective  Pt denied falls since last visit. Pt reported she started a new medication (sample-ANORO) to reduce cough after using inhaler. Pt reported dizziness is better but still feels a little bit when she stands up.     Pertinent History  2L of SpO2, osteoporosis, COPD, aortic valve replacement, HTN, HLD, DM, depression, diverticulosis, incontinence, glaucoma, 02/2011: 40-50% carotid stenosis, emphysema, CKD stage 3, CAD, hiatal hernia    Patient Stated Goals  She'd love to stop using SpO2 as it is heavy, she'd like to walk without an AD.    Currently in Pain?   No/denies         [facility] PT Assessment - 02/08/18 1324      6 Minute Walk- Baseline   6 Minute Walk- Baseline  yes    BP (mmHg)  146/62    HR (bpm)  60    02 Sat (%RA)  93 % on 2L of SpO2    Modified Borg Scale for Dyspnea  0- Nothing at all    Perceived Rate of Exertion (Borg)  6-      6 Minute walk- Post Test   6 Minute Walk Post Test  yes    BP (mmHg)  185/64    HR (bpm)  67    02 Sat (%RA)  88 % incr. to 92% with pursed lip breathing and seated rest break    Modified Borg Scale for Dyspnea  1- Very mild shortness of breath    Perceived Rate of Exertion (Borg)  9- very light      6 minute walk test results    Aerobic Endurance Distance Walked  845    Endurance additional comments  with rollator, several LOB episodes 2/2 decr. heel strike. Pt denied pain, lightheaded, N/T, weakness. Pt reported she had a headache this morning, but took an Aspirin and it went away. 6' for her age group (80-89y/o). 28' is the MCID for COPD patients.                    Vestibular Treatment/Exercise - 02/08/18 1548      Vestibular Treatment/Exercise   Vestibular Treatment Provided  Gaze    Gaze Exercises  X1 Viewing Horizontal;X1 Viewing Vertical      X1 Viewing Horizontal   Foot Position  seated    Time  -- 20-30 sec.    Reps  2    Comments  Pt reported slight dizziness which improved with rest. Cues and demo for technique. Please see pt instructions for HEP details.       X1 Viewing Vertical   Foot Position  seated    Time  -- 20-30 sec.     Reps  1    Comments  Cues and demo for technique.            Self Care: PT Education - 02/08/18 1550    Education provided  Yes    Education Details  PT discussed 6MWT results and provided pt with gaze stabilization HEP. PT attempted to raise rollator per pt request, however, rollator is on the highest handle height. Therefore, PT encouraged pt to improve upright posture, stay close to rollator and improve heel strike to  improve posture and gait deviations.     Person(s) Educated  Patient    Methods  Explanation;Handout;Demonstration;Verbal cues    Comprehension  Verbalized understanding       PT Short Term Goals - 02/08/18 1612  PT SHORT TERM GOAL #1   Title  Pt will be IND in HEP to improve balance, endurance, strength, and flexibility. TARGET DATE FOR ALL STGS: 02/22/18    Status  New      PT SHORT TERM GOAL #2   Title  Pt will improve DGI score to >/=16/24 to decr. falls risk.     Status  New      PT SHORT TERM GOAL #3   Title  Pt will improve gait speed with LRAD, not holding SpO2 machine to >/=2.62 ft/sec. to safely amb. in the community.     Status  New      PT SHORT TERM GOAL #4   Title  Pt will report zero falls over the last 2 weeks to improve safety.     Status  New      PT SHORT TERM GOAL #5   Title  Perform 6MWT and write goals as indicated.     Status  Achieved      Additional Short Term Goals   Additional Short Term Goals  Yes      PT SHORT TERM GOAL #6   Title  Pt will amb. 500' with LRAD, not carrying SpO2 machine, at MOD I level to improve safety during functional mobility.     Status  New      PT SHORT TERM GOAL #7   Title  Pt will improve 6MWT distance with LRAD to 934' to improve endurance.     Baseline  845' with rollator    Status  New        PT Long Term Goals - 02/08/18 1613      PT LONG TERM GOAL #1   Title  Pt will verbalize understanding fall prevention strategies to reduce falls risk. TARGET DATE FOR ALL LTGS: 03/22/18    Status  New      PT LONG TERM GOAL #2   Title  Pt will improve DGI score to >/=20/24 to decr. falls risk.     Status  New      PT LONG TERM GOAL #3   Title  Pt will amb. 600' over even/uneven terrain with LRAD, at MOD I level, including traversing curbs to improve safety during functional mobility.     Status  New      PT LONG TERM GOAL #4   Title  Pt will amb. 100', at MOD I level (slower speed), over even terrain to amb.  safely at home.     Status  New      PT LONG TERM GOAL #5   Title  Pt will improve walking distance during 6MWT to 1022' to improve endurance.     Status  New            Plan - 02/08/18 1551    Clinical Impression Statement  Pt's 6MWT distance (845') is less than norm value for her age group (80-89y/o-1286'). The MCID for 6MWT in individuals with COPD is 177', therefore, PT based LTG on COPD MCID. Pt's systolic BP incr. >54mmHg after 6MWT, and decr. back to baseline after 8 minute rest break. Pt denied N/T, weakness, pain, or confusion. PT will continue to monitor and send note to MD. Pt tolerated gaze stabilization HEP well. Continue with POC.     Rehab Potential  Good    Clinical Impairments Affecting Rehab Potential  see above    PT Frequency  2x / week    PT Duration  8 weeks  PT Treatment/Interventions  ADLs/Self Care Home Management;Biofeedback;Therapeutic exercise;Manual techniques;Therapeutic activities;Functional mobility training;Stair training;Gait training;Patient/family education;Orthotic Fit/Training;DME Instruction;Neuromuscular re-education;Balance training;Vestibular;Canalith Repostioning    PT Next Visit Plan  Review previous HEP and modify as indicated    Consulted and Agree with Plan of Care  Patient       Patient will benefit from skilled therapeutic intervention in order to improve the following deficits and impairments:  Abnormal gait, Decreased endurance, Decreased knowledge of use of DME, Decreased balance, Decreased mobility, Impaired flexibility, Postural dysfunction, Decreased strength, Dizziness  Visit Diagnosis: Other abnormalities of gait and mobility  Unsteadiness on feet  Muscle weakness (generalized)  Dizziness and giddiness     Problem List Patient Active Problem List   Diagnosis Date Noted  . Pulmonary nodule 12/03/2017  . Iron deficiency anemia   . Heme + stool   . CAD (coronary artery disease) 10/27/2017  . Microcytic anemia  10/27/2017  . Anemia due to GI blood loss 10/27/2017  . COPD with acute exacerbation ([place]) 10/27/2017  . Pulmonary nodule, left 10/15/2017  . Anemia, chronic disease 12/07/2016  . Chronic obstructive pulmonary disease ([place])   . Acute on chronic respiratory failure with hypoxia ([place]) 11/19/2016  . CKD (chronic kidney disease), stage III ([place]) 11/19/2016  . Diabetes mellitus type 2, uncontrolled ([place]) 11/19/2016  . Recurrent pneumonia 11/19/2016  . Chronic diastolic CHF (congestive heart failure) ([place]) 11/19/2016  . Pulmonary HTN ([place]) 11/19/2016  . HLD (hyperlipidemia) 11/19/2016  . Hiatal hernia 05/01/2016  . History of bacterial pneumonia 05/01/2016  . COPD exacerbation ([place]) 11/18/2015  . Physical deconditioning 07/24/2013  . S/P AVR (aortic valve replacement) 07/23/2013  . S/P CABG x 1 07/23/2013  . Aortic stenosis 06/10/2013  . Essential hypertension 06/10/2013  . COPD GOLD II  10/26/2011    Tymar Polyak L 02/08/2018, 4:14 PM  [facility] [address] [place], [place], [address] Phone: [phone]   Fax:  [phone]  Name: [name] MRN: [id number] Date of Birth: 03-03-1932  Geoffry Paradise, PT,DPT 02/08/18 4:15 PM Phone: [phone] Fax: [phone]

## 2018-02-08 NOTE — Patient Instructions (Signed)
Access Code: [email or web address]  URL: [email or web address]  Date: 02/08/2018  Prepared by: Geoffry Paradise   Exercises  Seated Gaze Stabilization with Head Rotation - 3 sets - 30 hold - 1x daily - 7x weekly

## 2018-02-12 ENCOUNTER — Encounter (HOSPITAL_COMMUNITY): Payer: Self-pay

## 2018-02-12 ENCOUNTER — Ambulatory Visit (HOSPITAL_COMMUNITY)
Admission: RE | Admit: 2018-02-12 | Discharge: 2018-02-12 | Disposition: A | Discharge status: Home / Self Care | Payer: Medicare Other | Source: Ambulatory Visit | Attending: Radiation Oncology | Admitting: Radiation Oncology

## 2018-02-12 DIAGNOSIS — K449 Diaphragmatic hernia without obstruction or gangrene: Secondary | ICD-10-CM | POA: Diagnosis not present

## 2018-02-12 DIAGNOSIS — J439 Emphysema, unspecified: Secondary | ICD-10-CM | POA: Insufficient documentation

## 2018-02-12 DIAGNOSIS — R911 Solitary pulmonary nodule: Secondary | ICD-10-CM | POA: Diagnosis present

## 2018-02-12 DIAGNOSIS — I700 Atherosclerosis of aorta: Secondary | ICD-10-CM | POA: Insufficient documentation

## 2018-02-12 DIAGNOSIS — I2510 Atherosclerotic heart disease of native coronary artery without angina pectoris: Secondary | ICD-10-CM | POA: Insufficient documentation

## 2018-02-15 ENCOUNTER — Ambulatory Visit: Payer: Medicare Other | Admitting: Physical Therapy

## 2018-02-15 ENCOUNTER — Encounter: Payer: Self-pay | Admitting: Physical Therapy

## 2018-02-15 DIAGNOSIS — R2681 Unsteadiness on feet: Secondary | ICD-10-CM

## 2018-02-15 DIAGNOSIS — M6281 Muscle weakness (generalized): Secondary | ICD-10-CM

## 2018-02-15 DIAGNOSIS — R2689 Other abnormalities of gait and mobility: Secondary | ICD-10-CM

## 2018-02-15 NOTE — Therapy (Signed)
[facility] [address] [place], [place], [address] Phone: [phone]   Fax:  [phone]  Physical Therapy Treatment  Patient Details  Name: [name] MRN: [id number] Date of Birth: 1931-10-10 Referring Provider: Dr. Dema Severin   Encounter Date: 02/15/2018  PT End of Session - 02/15/18 1523    Visit Number  4    Number of Visits  17    Date for PT Re-Evaluation  03/26/18    Authorization Type  [employer]HC Medicare    PT Start Time  1147    PT Stop Time  1233    PT Time Calculation (min)  46 min       Past Medical History:  Diagnosis Date  . Anxiety   . Aortic stenosis   . Arthritis   . Asthma    uses inhaler as needed  . Depression   . Diabetes mellitus   . Emphysema of lung ([place])   . GERD (gastroesophageal reflux disease)   . Glaucoma   . Hyperlipidemia   . Hypertension   . [name]   . Murmur   . Nephrolithiasis   . Pneumonia 3/17 and 3/18    Past Surgical History:  Procedure Laterality Date  . ABDOMINAL HYSTERECTOMY    . ANKLE SURGERY  1998   d/t fx.  Left ankle  . ANTERIOR AND POSTERIOR REPAIR  06/21/2011   Procedure: ANTERIOR (CYSTOCELE) AND POSTERIOR REPAIR (RECTOCELE);  Surgeon: Maisie Fus;  Location: [facility];  Service: Gynecology;  Laterality: N/A;  . AORTIC VALVE REPLACEMENT N/A 07/17/2013   Procedure: AORTIC VALVE REPLACEMENT (AVR);  Surgeon: Ivin Poot, MD;  Location: [facility];  Service: Open Heart Surgery;  Laterality: N/A;  . CARDIAC CATHETERIZATION    . CARDIAC VALVE REPLACEMENT    . CORONARY ANGIOPLASTY    . CORONARY ARTERY BYPASS GRAFT N/A 07/17/2013   Procedure: CORONARY ARTERY BYPASS GRAFT, TIMES ONE, ON PUMP, USING RIGHT INTERNAL MAMMARY ARTERY.;  Surgeon: Ivin Poot, MD;  Location: [facility];  Service: Open Heart Surgery;  Laterality: N/A;  . FACIAL COSMETIC SURGERY     face lift  . INTRAOPERATIVE TRANSESOPHAGEAL ECHOCARDIOGRAM N/A 07/17/2013   Procedure: INTRAOPERATIVE TRANSESOPHAGEAL ECHOCARDIOGRAM;  Surgeon: Ivin Poot, MD;  Location: [facility];  Service: Open Heart Surgery;  Laterality: N/A;  . LAPAROSCOPIC ASSISTED VAGINAL HYSTERECTOMY  06/21/2011   Procedure: LAPAROSCOPIC ASSISTED VAGINAL HYSTERECTOMY;  Surgeon: Maisie Fus;  Location: [facility];  Service: Gynecology;  Laterality: N/A;  . LEFT AND RIGHT HEART CATHETERIZATION WITH CORONARY ANGIOGRAM N/A 06/27/2013   Procedure: LEFT AND RIGHT HEART CATHETERIZATION WITH CORONARY ANGIOGRAM;  Surgeon: Ramond Dial, MD;  [facility]: [facility];  Service: Cardiovascular;  Laterality: N/A;  . NOSE SURGERY    . SALPINGOOPHORECTOMY  06/21/2011   Procedure: SALPINGO OOPHERECTOMY;  Surgeon: Maisie Fus;  Location: [facility];  Service: Gynecology;  Laterality: Bilateral;  . VAGINAL PROLAPSE REPAIR  06/21/2011   Procedure: SACROPEXY;  Surgeon: Maisie Fus;  Location: [facility];  Service: Gynecology;  Laterality: N/A;  sacrospinous ligament suspension    There were no vitals filed for this visit.  Subjective Assessment - 02/15/18 1517    Subjective  Pt states she has dizziness when she stands on tip toes and looks up in closet - only occurs in standing; is worse first thing in the morning     Pertinent History  2L of SpO2, osteoporosis, COPD, aortic valve replacement, HTN, HLD,  DM, depression, diverticulosis, incontinence, glaucoma, 02/2011: 40-50% carotid stenosis, emphysema, CKD stage 3, CAD, hiatal hernia    Patient Stated Goals  She'd love to stop using SpO2 as it is heavy, she'd like to walk without an AD.    Currently in Pain?  No/denies                       [facility] Adult PT Treatment/Exercise - 02/15/18 1203      Ambulation/Gait   Ambulation/Gait  Yes    Ambulation/Gait Assistance  5: Supervision    Ambulation/Gait Assistance Details  pt on 2L O2    Ambulation Distance (Feet)  115 Feet    Assistive device  Rollator    Gait Pattern  Step-through pattern;Decreased  stride length;Decreased trunk rotation    Ambulation Surface  Level;Indoor      Knee/Hip Exercises: Aerobic   Recumbent Bike  Scifit level 1 x 5" with UE and LE's          Balance Exercises - 02/15/18 1519      Balance Exercises: Standing   Tandem Stance  Eyes open;1 rep;15 secs;Upper extremity support 2 partial tandem stance - each position with    Sidestepping  2 reps along counter    Other Standing Exercises  Performed standing at counter with CGA on Rt side, counter for support on Lt side:  forward, back and side kcks x 10 reps:  marching in place 10 reps:  stepping up/back and laterally toward tile line on floor - 10 reps each foot with UE support prn      Epley maneuver x 1 rep for Lt BPPV - no nystagmus observed in any position of Epley manuever    PT Short Term Goals - 02/08/18 1612      PT SHORT TERM GOAL #1   Title  Pt will be IND in HEP to improve balance, endurance, strength, and flexibility. TARGET DATE FOR ALL STGS: 02/22/18    Status  New      PT SHORT TERM GOAL #2   Title  Pt will improve DGI score to >/=16/24 to decr. falls risk.     Status  New      PT SHORT TERM GOAL #3   Title  Pt will improve gait speed with LRAD, not holding SpO2 machine to >/=2.62 ft/sec. to safely amb. in the community.     Status  New      PT SHORT TERM GOAL #4   Title  Pt will report zero falls over the last 2 weeks to improve safety.     Status  New      PT SHORT TERM GOAL #5   Title  Perform 6MWT and write goals as indicated.     Status  Achieved      Additional Short Term Goals   Additional Short Term Goals  Yes      PT SHORT TERM GOAL #6   Title  Pt will amb. 500' with LRAD, not carrying SpO2 machine, at MOD I level to improve safety during functional mobility.     Status  New      PT SHORT TERM GOAL #7   Title  Pt will improve 6MWT distance with LRAD to 934' to improve endurance.     Baseline  845' with rollator    Status  New        PT Long Term Goals -  02/08/18 1613      PT LONG TERM  GOAL #1   Title  Pt will verbalize understanding fall prevention strategies to reduce falls risk. TARGET DATE FOR ALL LTGS: 03/22/18    Status  New      PT LONG TERM GOAL #2   Title  Pt will improve DGI score to >/=20/24 to decr. falls risk.     Status  New      PT LONG TERM GOAL #3   Title  Pt will amb. 600' over even/uneven terrain with LRAD, at MOD I level, including traversing curbs to improve safety during functional mobility.     Status  New      PT LONG TERM GOAL #4   Title  Pt will amb. 100', at MOD I level (slower speed), over even terrain to amb. safely at home.     Status  New      PT LONG TERM GOAL #5   Title  Pt will improve walking distance during 6MWT to 1022' to improve endurance.     Status  New            Plan - 02/15/18 1523    Clinical Impression Statement  Pt had mild low intensity left upbeating nystagmus (3-4 beats) in Lt Dix-Hallpike test position; Epley performed 1 reps for Lt BPPV with pt reporting less dizziness in initial position of Epley's compared to that reported in Lt Dix-Hallpike position; pt stated she felt much better at end of session compared to that at start of session Pt very unsteady with standing balance exercises with use of only 1 UE support    Rehab Potential  Good    Clinical Impairments Affecting Rehab Potential  see above    PT Frequency  2x / week    PT Duration  8 weeks    PT Treatment/Interventions  ADLs/Self Care Home Management;Biofeedback;Therapeutic exercise;Manual techniques;Therapeutic activities;Functional mobility training;Stair training;Gait training;Patient/family education;Orthotic Fit/Training;DME Instruction;Neuromuscular re-education;Balance training;Vestibular;Canalith Repostioning    PT Next Visit Plan  Review previous HEP and modify as indicated    PT Home Exercise Plan  balance HEP given on 09-25-17       Patient will benefit from skilled therapeutic intervention in order to  improve the following deficits and impairments:  Abnormal gait, Decreased endurance, Decreased knowledge of use of DME, Decreased balance, Decreased mobility, Impaired flexibility, Postural dysfunction, Decreased strength, Dizziness  Visit Diagnosis: Other abnormalities of gait and mobility  Unsteadiness on feet  Muscle weakness (generalized)     Problem List Patient Active Problem List   Diagnosis Date Noted  . Pulmonary nodule 12/03/2017  . Iron deficiency anemia   . Heme + stool   . CAD (coronary artery disease) 10/27/2017  . Microcytic anemia 10/27/2017  . Anemia due to GI blood loss 10/27/2017  . COPD with acute exacerbation ([place]) 10/27/2017  . Pulmonary nodule, left 10/15/2017  . Anemia, chronic disease 12/07/2016  . Chronic obstructive pulmonary disease ([place])   . Acute on chronic respiratory failure with hypoxia ([place]) 11/19/2016  . CKD (chronic kidney disease), stage III ([place]) 11/19/2016  . Diabetes mellitus type 2, uncontrolled ([place]) 11/19/2016  . Recurrent pneumonia 11/19/2016  . Chronic diastolic CHF (congestive heart failure) ([place]) 11/19/2016  . Pulmonary HTN ([place]) 11/19/2016  . HLD (hyperlipidemia) 11/19/2016  . Hiatal hernia 05/01/2016  . History of bacterial pneumonia 05/01/2016  . COPD exacerbation ([place]) 11/18/2015  . Physical deconditioning 07/24/2013  . S/P AVR (aortic valve replacement) 07/23/2013  . S/P CABG x 1 07/23/2013  . Aortic stenosis 06/10/2013  . Essential  hypertension 06/10/2013  . COPD GOLD II  10/26/2011    [place], PT 02/15/2018, 3:33 PM  [facility] [address] [place], [place], [address] Phone: [phone]   Fax:  [phone]  Name: [name] MRN: [id number] Date of Birth: 1931-10-30

## 2018-02-18 ENCOUNTER — Ambulatory Visit: Payer: Medicare Other | Attending: Family Medicine | Admitting: Physical Therapy

## 2018-02-18 ENCOUNTER — Encounter: Payer: Self-pay | Admitting: Physical Therapy

## 2018-02-18 DIAGNOSIS — R2681 Unsteadiness on feet: Secondary | ICD-10-CM

## 2018-02-18 DIAGNOSIS — M6281 Muscle weakness (generalized): Secondary | ICD-10-CM

## 2018-02-18 DIAGNOSIS — R42 Dizziness and giddiness: Secondary | ICD-10-CM | POA: Diagnosis present

## 2018-02-18 DIAGNOSIS — R2689 Other abnormalities of gait and mobility: Secondary | ICD-10-CM | POA: Diagnosis not present

## 2018-02-18 NOTE — Therapy (Signed)
[facility] [address] [place], [place], [address] Phone: [phone]   Fax:  [phone]  Physical Therapy Treatment  Patient Details  Name: [name] MRN: [id number] Date of Birth: 09-13-1932 Referring Provider: Dr. Dema Severin   Encounter Date: 02/18/2018  PT End of Session - 02/18/18 1053    Visit Number  5    Number of Visits  17    Date for PT Re-Evaluation  03/26/18    Authorization Type  [employer] Medicare    PT Start Time  0846    PT Stop Time  0932    PT Time Calculation (min)  46 min       Past Medical History:  Diagnosis Date  . Anxiety   . Aortic stenosis   . Arthritis   . Asthma    uses inhaler as needed  . Depression   . Diabetes mellitus   . Emphysema of lung ([place])   . GERD (gastroesophageal reflux disease)   . Glaucoma   . Hyperlipidemia   . Hypertension   . [name]   . Murmur   . Nephrolithiasis   . Pneumonia 3/17 and 3/18    Past Surgical History:  Procedure Laterality Date  . ABDOMINAL HYSTERECTOMY    . ANKLE SURGERY  1998   d/t fx.  Left ankle  . ANTERIOR AND POSTERIOR REPAIR  06/21/2011   Procedure: ANTERIOR (CYSTOCELE) AND POSTERIOR REPAIR (RECTOCELE);  Surgeon: Maisie Fus;  Location: [facility];  Service: Gynecology;  Laterality: N/A;  . AORTIC VALVE REPLACEMENT N/A 07/17/2013   Procedure: AORTIC VALVE REPLACEMENT (AVR);  Surgeon: Ivin Poot, MD;  Location: [facility];  Service: Open Heart Surgery;  Laterality: N/A;  . CARDIAC CATHETERIZATION    . CARDIAC VALVE REPLACEMENT    . CORONARY ANGIOPLASTY    . CORONARY ARTERY BYPASS GRAFT N/A 07/17/2013   Procedure: CORONARY ARTERY BYPASS GRAFT, TIMES ONE, ON PUMP, USING RIGHT INTERNAL MAMMARY ARTERY.;  Surgeon: Ivin Poot, MD;  Location: [facility];  Service: Open Heart Surgery;  Laterality: N/A;  . FACIAL COSMETIC SURGERY     face lift  . INTRAOPERATIVE TRANSESOPHAGEAL ECHOCARDIOGRAM N/A 07/17/2013   Procedure:  INTRAOPERATIVE TRANSESOPHAGEAL ECHOCARDIOGRAM;  Surgeon: Ivin Poot, MD;  Location: [facility];  Service: Open Heart Surgery;  Laterality: N/A;  . LAPAROSCOPIC ASSISTED VAGINAL HYSTERECTOMY  06/21/2011   Procedure: LAPAROSCOPIC ASSISTED VAGINAL HYSTERECTOMY;  Surgeon: Maisie Fus;  Location: [facility];  Service: Gynecology;  Laterality: N/A;  . LEFT AND RIGHT HEART CATHETERIZATION WITH CORONARY ANGIOGRAM N/A 06/27/2013   Procedure: LEFT AND RIGHT HEART CATHETERIZATION WITH CORONARY ANGIOGRAM;  Surgeon: Ramond Dial, MD;  Location: [facility];  Service: Cardiovascular;  Laterality: N/A;  . NOSE SURGERY    . SALPINGOOPHORECTOMY  06/21/2011   Procedure: SALPINGO OOPHERECTOMY;  Surgeon: Maisie Fus;  Location: [facility];  Service: Gynecology;  Laterality: Bilateral;  . VAGINAL PROLAPSE REPAIR  06/21/2011   Procedure: SACROPEXY;  Surgeon: Maisie Fus;  Location: [facility];  Service: Gynecology;  Laterality: N/A;  sacrospinous ligament suspension    There were no vitals filed for this visit.  Subjective Assessment - 02/18/18 1036    Subjective  Pt states she went with her husband to [place] over the weekend - states that her husband had unplugged her oxygen while they were gone and forgot to plug back in when they returned; states she fell asleep, he woke her up at midnight  and told her - checked her O2 rate and it was 89% - says she was surprised it wasn't lower than that  Pt states she thinks the dizziness is a little better than it was last week    Pertinent History  2L of SpO2, osteoporosis, COPD, aortic valve replacement, HTN, HLD, DM, depression, diverticulosis, incontinence, glaucoma, 02/2011: 40-50% carotid stenosis, emphysema, CKD stage 3, CAD, hiatal hernia    Patient Stated Goals  She'd love to stop using SpO2 as it is heavy, she'd like to walk without an AD.    Currently in Pain?  No/denies                       [facility] Adult PT Treatment/Exercise - 02/18/18 0859       Transfers   Transfers  Sit to Stand    Sit to Stand  5: Supervision    Sit to Stand Details (indicate cue type and reason)  Pt performed 5 reps from mat with feet on floor: initiallly had posterior lean and used mat table to assist in stabilizing by bracing iwth LE's; able to gain balance without LE stabilization against mat table with cues to increase anterior weight shift ;  5 reps performed with feet on [employer] with minimal UE support with CGA    Stand to Sit  5: Supervision      Ambulation/Gait   Ambulation/Gait  Yes    Ambulation/Gait Assistance  5: Supervision    Ambulation/Gait Assistance Details  pt on 2L O2    Ambulation Distance (Feet)  230 Feet    Assistive device  Rollator    Gait Pattern  Step-through pattern;Decreased stride length;Decreased trunk rotation    Ambulation Surface  Level;Indoor    Gait Comments  Pt inquired about the UP walker (states  she saw it on TV)- pt was shown UP walker - used it to ambulate 96' with supervision - liked it as it allows her to stand erect but states it is too big and she prefers her rollator      Knee/Hip Exercises: Aerobic   Recumbent Bike  Scifit x 5 1/2" with UE and LE's Battery not working on [employer] so no level set      Knee/Hip Exercises: Standing   Heel Raises  Both;1 set;10 reps;3 seconds with UE support    Forward Step Up  Both;1 set;10 reps;Step Height: 6";Hand Hold: 2 O2 rate 84% after this exercise - seated rest break taken    Other Standing Knee Exercises  Pt performed standing hip flexion, abduction, extension with 2# weight on each leg 10 reps each -- O2 rate  84% after completing standing hip extension and abduction:  pt able to sit for approx. 1" - instructed to do deep slow breaths with purse lip breathing and O2 increased to 94%          Balance Exercises - 02/18/18 1048      Balance Exercises: Standing   Rockerboard  Anterior/posterior;10 reps;UE support;Lateral;Head turns;EO;EC;10 seconds;30 seconds    Other  Standing Exercises  Pt performed stepping over and back of black balance beam - 10 reps each with minimal UE support on // bar:  pt performed alternate tap ups to 1st step (6") 10 reps each foot with minimal UE support        PT Education - 02/18/18 1051    Education provided  Yes    Education Details  Reviewed previously established HEP for balance (pt brought handouts  today); pt is doing squats, kicks - forward, back and side, marching, SLS, and sidestepping:  instructed pt to do partial tandem stance as she states she is unable to do tandem     Person(s) Educated  Patient    Methods  Explanation pt has handouts from previous admission    Comprehension  Verbalized understanding       PT Short Term Goals - 02/08/18 1612      PT SHORT TERM GOAL #1   Title  Pt will be IND in HEP to improve balance, endurance, strength, and flexibility. TARGET DATE FOR ALL STGS: 02/22/18    Status  New      PT SHORT TERM GOAL #2   Title  Pt will improve DGI score to >/=16/24 to decr. falls risk.     Status  New      PT SHORT TERM GOAL #3   Title  Pt will improve gait speed with LRAD, not holding SpO2 machine to >/=2.62 ft/sec. to safely amb. in the community.     Status  New      PT SHORT TERM GOAL #4   Title  Pt will report zero falls over the last 2 weeks to improve safety.     Status  New      PT SHORT TERM GOAL #5   Title  Perform 6MWT and write goals as indicated.     Status  Achieved      Additional Short Term Goals   Additional Short Term Goals  Yes      PT SHORT TERM GOAL #6   Title  Pt will amb. 500' with LRAD, not carrying SpO2 machine, at MOD I level to improve safety during functional mobility.     Status  New      PT SHORT TERM GOAL #7   Title  Pt will improve 6MWT distance with LRAD to 934' to improve endurance.     Baseline  845' with rollator    Status  New        PT Long Term Goals - 02/08/18 1613      PT LONG TERM GOAL #1   Title  Pt will verbalize understanding  fall prevention strategies to reduce falls risk. TARGET DATE FOR ALL LTGS: 03/22/18    Status  New      PT LONG TERM GOAL #2   Title  Pt will improve DGI score to >/=20/24 to decr. falls risk.     Status  New      PT LONG TERM GOAL #3   Title  Pt will amb. 600' over even/uneven terrain with LRAD, at MOD I level, including traversing curbs to improve safety during functional mobility.     Status  New      PT LONG TERM GOAL #4   Title  Pt will amb. 100', at MOD I level (slower speed), over even terrain to amb. safely at home.     Status  New      PT LONG TERM GOAL #5   Title  Pt will improve walking distance during 6MWT to 1022' to improve endurance.     Status  New            Plan - 02/18/18 1053    Clinical Impression Statement  Pt has dyspnea with standing balance activities with O2 saturation rate dropping into mid 80's, however, O2 increases quickly with deep breaths and seated rest periods.  Vertigo not addressed today as pt reports  improvement compared to that reported in previous session.  Pt's posture improved with UP RW but pt states she prefers her walker due to smaller in size - states she must make herself stand up straighter.     Rehab Potential  Good    Clinical Impairments Affecting Rehab Potential  see above    PT Frequency  2x / week    PT Duration  8 weeks    PT Treatment/Interventions  ADLs/Self Care Home Management;Biofeedback;Therapeutic exercise;Manual techniques;Therapeutic activities;Functional mobility training;Stair training;Gait training;Patient/family education;Orthotic Fit/Training;DME Instruction;Neuromuscular re-education;Balance training;Vestibular;Canalith Repostioning    PT Next Visit Plan  check STG's - due 02-22-18:  reassess vertigo as needed - pt states improvement on 02-18-18    PT Home Exercise Plan  balance HEP given on 09-25-17    Consulted and Agree with Plan of Care  Patient       Patient will benefit from skilled therapeutic intervention in  order to improve the following deficits and impairments:  Abnormal gait, Decreased endurance, Decreased knowledge of use of DME, Decreased balance, Decreased mobility, Impaired flexibility, Postural dysfunction, Decreased strength, Dizziness  Visit Diagnosis: Other abnormalities of gait and mobility  Unsteadiness on feet  Muscle weakness (generalized)     Problem List Patient Active Problem List   Diagnosis Date Noted  . Pulmonary nodule 12/03/2017  . Iron deficiency anemia   . Heme + stool   . CAD (coronary artery disease) 10/27/2017  . Microcytic anemia 10/27/2017  . Anemia due to GI blood loss 10/27/2017  . COPD with acute exacerbation ([place]) 10/27/2017  . Pulmonary nodule, left 10/15/2017  . Anemia, chronic disease 12/07/2016  . Chronic obstructive pulmonary disease ([place])   . Acute on chronic respiratory failure with hypoxia ([place]) 11/19/2016  . CKD (chronic kidney disease), stage III ([place]) 11/19/2016  . Diabetes mellitus type 2, uncontrolled ([place]) 11/19/2016  . Recurrent pneumonia 11/19/2016  . Chronic diastolic CHF (congestive heart failure) ([place]) 11/19/2016  . Pulmonary HTN ([place]) 11/19/2016  . HLD (hyperlipidemia) 11/19/2016  . Hiatal hernia 05/01/2016  . History of bacterial pneumonia 05/01/2016  . COPD exacerbation ([place]) 11/18/2015  . Physical deconditioning 07/24/2013  . S/P AVR (aortic valve replacement) 07/23/2013  . S/P CABG x 1 07/23/2013  . Aortic stenosis 06/10/2013  . Essential hypertension 06/10/2013  . COPD GOLD II  10/26/2011    Alda Lea, PT 02/18/2018, 11:03 AM  [facility] [address] [place], [place], [address] Phone: [phone]   Fax:  [phone]  Name: [name] MRN: [id number] Date of Birth: May 03, 1932

## 2018-02-21 ENCOUNTER — Ambulatory Visit: Payer: Medicare Other

## 2018-02-21 DIAGNOSIS — R2681 Unsteadiness on feet: Secondary | ICD-10-CM

## 2018-02-21 DIAGNOSIS — R42 Dizziness and giddiness: Secondary | ICD-10-CM

## 2018-02-21 DIAGNOSIS — R2689 Other abnormalities of gait and mobility: Secondary | ICD-10-CM | POA: Diagnosis not present

## 2018-02-21 DIAGNOSIS — M6281 Muscle weakness (generalized): Secondary | ICD-10-CM

## 2018-02-21 NOTE — Therapy (Signed)
[facility] [address] [place], [place], [address] Phone: [phone]   Fax:  [phone]  Physical Therapy Treatment  Patient Details  Name: [name] MRN: [id number] Date of Birth: 1932/05/02 Referring Provider: Dr. Dema Severin   Encounter Date: 02/21/2018  PT End of Session - 02/21/18 1118    Visit Number  6    Number of Visits  17    Date for PT Re-Evaluation  03/26/18    Authorization Type  UHC Medicare    PT Start Time  0931    PT Stop Time  1013    PT Time Calculation (min)  42 min    Equipment Utilized During Treatment  -- S prn    Activity Tolerance  Patient tolerated treatment well    Behavior During Therapy  [facility] for tasks assessed/performed       Past Medical History:  Diagnosis Date  . Anxiety   . Aortic stenosis   . Arthritis   . Asthma    uses inhaler as needed  . Depression   . Diabetes mellitus   . Emphysema of lung ([place])   . GERD (gastroesophageal reflux disease)   . Glaucoma   . Hyperlipidemia   . Hypertension   . [name]   . Murmur   . Nephrolithiasis   . Pneumonia 3/17 and 3/18    Past Surgical History:  Procedure Laterality Date  . ABDOMINAL HYSTERECTOMY    . ANKLE SURGERY  1998   d/t fx.  Left ankle  . ANTERIOR AND POSTERIOR REPAIR  06/21/2011   Procedure: ANTERIOR (CYSTOCELE) AND POSTERIOR REPAIR (RECTOCELE);  Surgeon: Maisie Fus;  Location: [facility];  Service: Gynecology;  Laterality: N/A;  . AORTIC VALVE REPLACEMENT N/A 07/17/2013   Procedure: AORTIC VALVE REPLACEMENT (AVR);  Surgeon: Ivin Poot, MD;  Location: [facility];  Service: Open Heart Surgery;  Laterality: N/A;  . CARDIAC CATHETERIZATION    . CARDIAC VALVE REPLACEMENT    . CORONARY ANGIOPLASTY    . CORONARY ARTERY BYPASS GRAFT N/A 07/17/2013   Procedure: CORONARY ARTERY BYPASS GRAFT, TIMES ONE, ON PUMP, USING RIGHT INTERNAL MAMMARY ARTERY.;  Surgeon: Ivin Poot, MD;  Location: [facility];   Service: Open Heart Surgery;  Laterality: N/A;  . FACIAL COSMETIC SURGERY     face lift  . INTRAOPERATIVE TRANSESOPHAGEAL ECHOCARDIOGRAM N/A 07/17/2013   Procedure: INTRAOPERATIVE TRANSESOPHAGEAL ECHOCARDIOGRAM;  Surgeon: Ivin Poot, MD;  Location: [facility];  Service: Open Heart Surgery;  Laterality: N/A;  . LAPAROSCOPIC ASSISTED VAGINAL HYSTERECTOMY  06/21/2011   Procedure: LAPAROSCOPIC ASSISTED VAGINAL HYSTERECTOMY;  Surgeon: Maisie Fus;  Location: [facility];  Service: Gynecology;  Laterality: N/A;  . LEFT AND RIGHT HEART CATHETERIZATION WITH CORONARY ANGIOGRAM N/A 06/27/2013   Procedure: LEFT AND RIGHT HEART CATHETERIZATION WITH CORONARY ANGIOGRAM;  Surgeon: Ramond Dial, MD;  Location: [facility];  Service: Cardiovascular;  Laterality: N/A;  . NOSE SURGERY    . SALPINGOOPHORECTOMY  06/21/2011   Procedure: SALPINGO OOPHERECTOMY;  Surgeon: Maisie Fus;  Location: [facility];  Service: Gynecology;  Laterality: Bilateral;  . VAGINAL PROLAPSE REPAIR  06/21/2011   Procedure: SACROPEXY;  Surgeon: Maisie Fus;  Location: [facility];  Service: Gynecology;  Laterality: N/A;  sacrospinous ligament suspension    There were no vitals filed for this visit.  Subjective Assessment - 02/21/18 0935    Subjective  Pt reported she's been feeling dizzy this morning and has to hold  onto furniture in her house. Dizziness is worse when she bends down and describes it as spinning. Pt reported if she lies on Strong side her LLE hurts, she will inform MD.     Pertinent History  2L of SpO2, osteoporosis, COPD, aortic valve replacement, HTN, HLD, DM, depression, diverticulosis, incontinence, glaucoma, 02/2011: 40-50% carotid stenosis, emphysema, CKD stage 3, CAD, hiatal hernia    Patient Stated Goals  She'd love to stop using SpO2 as it is heavy, she'd like to walk without an AD.    Currently in Pain?  No/denies             Vestibular Assessment - 02/21/18 0940      Visual Acuity   Static  Line 4    Dynamic   Line2 no dizziness reported.       Positional Testing   Dix-Hallpike  Dix-Hallpike Right;Dix-Hallpike Left    Horizontal Canal Testing  Horizontal Canal Right;Horizontal Canal Left      Dix-Hallpike Right   Dix-Hallpike Right Duration  Pt reported wooziness but not spinning.     Dix-Hallpike Right Symptoms  No nystagmus      Dix-Hallpike Left   Dix-Hallpike Left Duration  Pt reported wooziness but no spinning.    Dix-Hallpike Left Symptoms  No nystagmus      Horizontal Canal Right   Horizontal Canal Right Duration  none    Horizontal Canal Right Symptoms  Normal      Horizontal Canal Left   Horizontal Canal Left Duration  Pt reported wooziness but no spinning.    Horizontal Canal Left Symptoms  Normal               [facility] Adult PT Treatment/Exercise - 02/21/18 7672      Ambulation/Gait   Ambulation/Gait  Yes    Ambulation/Gait Assistance  5: Supervision;6: Modified independent (Device/Increase time)    Ambulation/Gait Assistance Details  Pt experienced one LOB 2/2 toe catching but self corrected balance. Cues to improve heel strike.Pt on 2L of O2 with rollator. SaO2 on 2L of O2 after amb and rest break: >/=90% and HR 61-64bpm.     Ambulation Distance (Feet)  75 Feet x4, 500' in/outdoor    Assistive device  Rollator    Gait Pattern  Step-through pattern;Decreased stride length;Decreased trunk rotation    Ambulation Surface  Level;Indoor    Gait velocity  2.67f/sec. with rollator      Standardized Balance Assessment   Standardized Balance Assessment  Dynamic Gait Index      Dynamic Gait Index   Level Surface  Mild Impairment no AD    Change in Gait Speed  Mild Impairment performed without rollator, holding O2 per pt request    Gait with Horizontal Head Turns  Normal    Gait with Vertical Head Turns  Mild Impairment    Gait and Pivot Turn  Normal    Step Over Obstacle  Mild Impairment    Step Around Obstacles  Normal    Steps  Mild Impairment    Total Score  19              PT Education - 02/21/18 1117    Education provided  Yes    Education Details  PT discussed goal progress and outcome measure results.     Person(s) Educated  Patient    Methods  Explanation    Comprehension  Verbalized understanding       PT Short Term Goals - 02/21/18 1121  PT SHORT TERM GOAL #1   Title  Pt will be IND in HEP to improve balance, endurance, strength, and flexibility. TARGET DATE FOR ALL STGS: 02/22/18    Status  New      PT SHORT TERM GOAL #2   Title  Pt will improve DGI score to >/=16/24 to decr. falls risk.     Status  Achieved      PT SHORT TERM GOAL #3   Title  Pt will improve gait speed with LRAD, not holding SpO2 machine to >/=2.62 ft/sec. to safely amb. in the community.     Status  Partially Met      PT SHORT TERM GOAL #4   Title  Pt will report zero falls over the last 2 weeks to improve safety.     Status  Achieved      PT SHORT TERM GOAL #5   Title  Perform 6MWT and write goals as indicated.     Status  Achieved      PT SHORT TERM GOAL #6   Title  Pt will amb. 500' with LRAD, not carrying SpO2 machine, at MOD I level to improve safety during functional mobility.     Status  Partially Met      PT SHORT TERM GOAL #7   Title  Pt will improve 6MWT distance with LRAD to 934' to improve endurance.     Baseline  845' with rollator    Status  New        PT Long Term Goals - 02/08/18 1613      PT LONG TERM GOAL #1   Title  Pt will verbalize understanding fall prevention strategies to reduce falls risk. TARGET DATE FOR ALL LTGS: 03/22/18    Status  New      PT LONG TERM GOAL #2   Title  Pt will improve DGI score to >/=20/24 to decr. falls risk.     Status  New      PT LONG TERM GOAL #3   Title  Pt will amb. 600' over even/uneven terrain with LRAD, at MOD I level, including traversing curbs to improve safety during functional mobility.     Status  New      PT LONG TERM GOAL #4   Title  Pt will amb. 100', at MOD I level  (slower speed), over even terrain to amb. safely at home.     Status  New      PT LONG TERM GOAL #5   Title  Pt will improve walking distance during 6MWT to 1022' to improve endurance.     Status  New            Plan - 02/21/18 1118    Clinical Impression Statement  Pt demonstrated progress as she met STGs 1, 4, and 5. Pt partially met STGs 3 and 6. PT will finish assessing remaining STGs (HEP and 6MWT) next session. Positional testing negative for nystagmus or spinning dizziness, also pt's DVA vs. SVA line difference was not significant. PT will continue to monitor. Pt would continue to benefit from skilled PT to improve safety during functional mobility.     Rehab Potential  Good    Clinical Impairments Affecting Rehab Potential  see above    PT Frequency  2x / week    PT Duration  8 weeks    PT Treatment/Interventions  ADLs/Self Care Home Management;Biofeedback;Therapeutic exercise;Manual techniques;Therapeutic activities;Functional mobility training;Stair training;Gait training;Patient/family education;Orthotic Fit/Training;DME Instruction;Neuromuscular re-education;Balance training;Vestibular;Canalith Repostioning  PT Next Visit Plan  Finish assessing STGs (HEP and 6MWT). reassess vertigo as needed     PT Home Exercise Plan  balance HEP given on 09-25-17    Consulted and Agree with Plan of Care  Patient       Patient will benefit from skilled therapeutic intervention in order to improve the following deficits and impairments:  Abnormal gait, Decreased endurance, Decreased knowledge of use of DME, Decreased balance, Decreased mobility, Impaired flexibility, Postural dysfunction, Decreased strength, Dizziness  Visit Diagnosis: Other abnormalities of gait and mobility  Unsteadiness on feet  Dizziness and giddiness  Muscle weakness (generalized)     Problem List Patient Active Problem List   Diagnosis Date Noted  . Pulmonary nodule 12/03/2017  . Iron deficiency anemia    . Heme + stool   . CAD (coronary artery disease) 10/27/2017  . Microcytic anemia 10/27/2017  . Anemia due to GI blood loss 10/27/2017  . COPD with acute exacerbation ([place]) 10/27/2017  . Pulmonary nodule, left 10/15/2017  . Anemia, chronic disease 12/07/2016  . Chronic obstructive pulmonary disease ([place])   . Acute on chronic respiratory failure with hypoxia ([place]) 11/19/2016  . CKD (chronic kidney disease), stage III ([place]) 11/19/2016  . Diabetes mellitus type 2, uncontrolled ([place]) 11/19/2016  . Recurrent pneumonia 11/19/2016  . Chronic diastolic CHF (congestive heart failure) ([place]) 11/19/2016  . Pulmonary HTN ([place]) 11/19/2016  . HLD (hyperlipidemia) 11/19/2016  . Hiatal hernia 05/01/2016  . History of bacterial pneumonia 05/01/2016  . COPD exacerbation ([place]) 11/18/2015  . Physical deconditioning 07/24/2013  . S/P AVR (aortic valve replacement) 07/23/2013  . S/P CABG x 1 07/23/2013  . Aortic stenosis 06/10/2013  . Essential hypertension 06/10/2013  . COPD GOLD II  10/26/2011    [name] 02/21/2018, 11:23 AM  [facility] [address] [place], [place], [address] Phone: [phone]   Fax:  [phone]  Name: [name] MRN: [id number] Date of Birth: Jul 01, 1932  Geoffry Paradise, PT,DPT 02/21/18 11:23 AM Phone: [phone] Fax: [phone]

## 2018-02-25 ENCOUNTER — Ambulatory Visit: Payer: Medicare Other | Admitting: Physical Therapy

## 2018-02-25 ENCOUNTER — Encounter: Payer: Self-pay | Admitting: Physical Therapy

## 2018-02-25 DIAGNOSIS — R2689 Other abnormalities of gait and mobility: Secondary | ICD-10-CM | POA: Diagnosis not present

## 2018-02-25 DIAGNOSIS — R2681 Unsteadiness on feet: Secondary | ICD-10-CM

## 2018-02-25 NOTE — Therapy (Signed)
[facility] [address] [place], [place], [address] Phone: [phone]   Fax:  [phone]  Physical Therapy Treatment  Patient Details  Name: [name] MRN: [id number] Date of Birth: 04-Mar-1932 Referring Provider: Dr. Dema Severin   Encounter Date: 02/25/2018  PT End of Session - 02/25/18 1913    Visit Number  7    Number of Visits  17    Date for PT Re-Evaluation  03/26/18    Authorization Type  [employer] Medicare    PT Start Time  0932    PT Stop Time  1017    PT Time Calculation (min)  45 min    Equipment Utilized During Treatment  Gait belt       Past Medical History:  Diagnosis Date  . Anxiety   . Aortic stenosis   . Arthritis   . Asthma    uses inhaler as needed  . Depression   . Diabetes mellitus   . Emphysema of lung ([place])   . GERD (gastroesophageal reflux disease)   . Glaucoma   . Hyperlipidemia   . Hypertension   . [name]   . Murmur   . Nephrolithiasis   . Pneumonia 3/17 and 3/18    Past Surgical History:  Procedure Laterality Date  . ABDOMINAL HYSTERECTOMY    . ANKLE SURGERY  1998   d/t fx.  Left ankle  . ANTERIOR AND POSTERIOR REPAIR  06/21/2011   Procedure: ANTERIOR (CYSTOCELE) AND POSTERIOR REPAIR (RECTOCELE);  Surgeon: Maisie Fus;  Location: [facility];  Service: Gynecology;  Laterality: N/A;  . AORTIC VALVE REPLACEMENT N/A 07/17/2013   Procedure: AORTIC VALVE REPLACEMENT (AVR);  Surgeon: Ivin Poot, MD;  Location: [facility];  Service: Open Heart Surgery;  Laterality: N/A;  . CARDIAC CATHETERIZATION    . CARDIAC VALVE REPLACEMENT    . CORONARY ANGIOPLASTY    . CORONARY ARTERY BYPASS GRAFT N/A 07/17/2013   Procedure: CORONARY ARTERY BYPASS GRAFT, TIMES ONE, ON PUMP, USING RIGHT INTERNAL MAMMARY ARTERY.;  Surgeon: Ivin Poot, MD;  Location: [facility];  Service: Open Heart Surgery;  Laterality: N/A;  . FACIAL COSMETIC SURGERY     face lift  . INTRAOPERATIVE  TRANSESOPHAGEAL ECHOCARDIOGRAM N/A 07/17/2013   Procedure: INTRAOPERATIVE TRANSESOPHAGEAL ECHOCARDIOGRAM;  Surgeon: Ivin Poot, MD;  Location: [facility];  Service: Open Heart Surgery;  Laterality: N/A;  . LAPAROSCOPIC ASSISTED VAGINAL HYSTERECTOMY  06/21/2011   Procedure: LAPAROSCOPIC ASSISTED VAGINAL HYSTERECTOMY;  Surgeon: Maisie Fus;  Location: [facility];  Service: Gynecology;  Laterality: N/A;  . LEFT AND RIGHT HEART CATHETERIZATION WITH CORONARY ANGIOGRAM N/A 06/27/2013   Procedure: LEFT AND RIGHT HEART CATHETERIZATION WITH CORONARY ANGIOGRAM;  Surgeon: Ramond Dial, MD;  Location: [facility];  Service: Cardiovascular;  Laterality: N/A;  . NOSE SURGERY    . SALPINGOOPHORECTOMY  06/21/2011   Procedure: SALPINGO OOPHERECTOMY;  Surgeon: Maisie Fus;  Location: [facility];  Service: Gynecology;  Laterality: Bilateral;  . VAGINAL PROLAPSE REPAIR  06/21/2011   Procedure: SACROPEXY;  Surgeon: Maisie Fus;  Location: [facility];  Service: Gynecology;  Laterality: N/A;  sacrospinous ligament suspension    There were no vitals filed for this visit.  Subjective Assessment - 02/25/18 1904    Subjective  Pt states she has appt with Dr. Dema Severin next week (03-07-18) and is going to ask her to check her blood - continues to have dizziness first thing in the mornings and wonders if  her blood count is low    Pertinent History  2L of SpO2, osteoporosis, COPD, aortic valve replacement, HTN, HLD, DM, depression, diverticulosis, incontinence, glaucoma, 02/2011: 40-50% carotid stenosis, emphysema, CKD stage 3, CAD, hiatal hernia    Patient Stated Goals  She'd love to stop using SpO2 as it is heavy, she'd like to walk without an AD.    Currently in Pain?  No/denies                       [facility] Adult PT Treatment/Exercise - 02/25/18 0948      Transfers   Transfers  Sit to Stand    Sit to Stand  5: Supervision    Sit to Stand Details (indicate cue type and reason)  feet on floor - no UEsupport used     Stand to Sit  5: Supervision    Number of Reps  Other reps (comment) 3      Ambulation/Gait   Ambulation/Gait  Yes    Ambulation/Gait Assistance  5: Supervision    Ambulation/Gait Assistance Details  pt had no LOB during 6" walk test with Rollator     Ambulation Distance (Feet)  800 Feet in 6" walk test - pt on 2L O2    Assistive device  Rollator    Gait Pattern  Step-through pattern;Decreased stride length;Decreased trunk rotation    Ambulation Surface  Level;Indoor      TherEx;  Step ups to 6" step with UE support 10 reps each leg Heel raises 10 reps - bilateral  Toe raises 10 reps each with UE support prn    Balance Exercises - 02/25/18 1909      Balance Exercises: Standing   Standing Eyes Opened  Narrow base of support (BOS);Solid surface;2 reps    Tandem Stance  Eyes open;Foam/compliant surface;2 reps;10 secs    Rockerboard  Anterior/posterior;10 reps;UE support;Lateral;Head turns;EO;EC;10 seconds;30 seconds    Sidestepping  Foam/compliant support;2 reps CGA to min assist    Other Standing Exercises  Pt performed marching, side, back and forward kicks on blue mat with UE support on // bar prn        Pt performed SLS activities - touching balance bubbles (3) with each foot with CGA: cone taps (3) with each foot with min assist For recovery of LOB  Crossovers - 5 reps each leg standing on blue mat - with CGA; standing in place  PT Short Term Goals - 02/25/18 0941      PT SHORT TERM GOAL #1   Title  Pt will be IND in HEP to improve balance, endurance, strength, and flexibility. TARGET DATE FOR ALL STGS: 02/22/18    Baseline  met 02-25-18    Status  Achieved      PT SHORT TERM GOAL #2   Title  Pt will improve DGI score to >/=16/24 to decr. falls risk.     Status  Achieved      PT SHORT TERM GOAL #3   Title  Pt will improve gait speed with LRAD, not holding SpO2 machine to >/=2.62 ft/sec. to safely amb. in the community.       PT SHORT TERM GOAL #4   Title  Pt will  report zero falls over the last 2 weeks to improve safety.       PT SHORT TERM GOAL #6   Title  Pt will amb. 500' with LRAD, not carrying SpO2 machine, at MOD I level to improve safety during functional mobility.  PT SHORT TERM GOAL #7   Title  Pt will improve 6MWT distance with LRAD to 934' to improve endurance.     Baseline  845' with rollator;  800' with rollator on 02-25-18     Period  Weeks    Status  Not Met        PT Long Term Goals - 02/08/18 1613      PT LONG TERM GOAL #1   Title  Pt will verbalize understanding fall prevention strategies to reduce falls risk. TARGET DATE FOR ALL LTGS: 03/22/18    Status  New      PT LONG TERM GOAL #2   Title  Pt will improve DGI score to >/=20/24 to decr. falls risk.     Status  New      PT LONG TERM GOAL #3   Title  Pt will amb. 600' over even/uneven terrain with LRAD, at MOD I level, including traversing curbs to improve safety during functional mobility.     Status  New      PT LONG TERM GOAL #4   Title  Pt will amb. 100', at MOD I level (slower speed), over even terrain to amb. safely at home.     Status  New      PT LONG TERM GOAL #5   Title  Pt will improve walking distance during 6MWT to 1022' to improve endurance.     Status  New            Plan - 02/25/18 1914    Clinical Impression Statement  P met STG #1 (HEP) and did not meet STG #7 as distance did not increase in 6" walk test (pt used rollator); pt continues to c/o dizziness which she states is worse in the mornngs and improves as day progresses.  Balance is improving but pt continues to need UE support for safety with standing balance exercises.      Rehab Potential  Good    Clinical Impairments Affecting Rehab Potential  see above    PT Frequency  2x / week    PT Duration  8 weeks    PT Next Visit Plan  cont balance and gait training    PT Home Exercise Plan  balance HEP given on 09-25-17    Consulted and Agree with Plan of Care  Patient       Patient  will benefit from skilled therapeutic intervention in order to improve the following deficits and impairments:  Abnormal gait, Decreased endurance, Decreased knowledge of use of DME, Decreased balance, Decreased mobility, Impaired flexibility, Postural dysfunction, Decreased strength, Dizziness  Visit Diagnosis: Other abnormalities of gait and mobility  Unsteadiness on feet     Problem List Patient Active Problem List   Diagnosis Date Noted  . Pulmonary nodule 12/03/2017  . Iron deficiency anemia   . Heme + stool   . CAD (coronary artery disease) 10/27/2017  . Microcytic anemia 10/27/2017  . Anemia due to GI blood loss 10/27/2017  . COPD with acute exacerbation ([place]) 10/27/2017  . Pulmonary nodule, left 10/15/2017  . Anemia, chronic disease 12/07/2016  . Chronic obstructive pulmonary disease ([place])   . Acute on chronic respiratory failure with hypoxia ([place]) 11/19/2016  . CKD (chronic kidney disease), stage III ([place]) 11/19/2016  . Diabetes mellitus type 2, uncontrolled ([place]) 11/19/2016  . Recurrent pneumonia 11/19/2016  . Chronic diastolic CHF (congestive heart failure) ([place]) 11/19/2016  . Pulmonary HTN ([place]) 11/19/2016  . HLD (hyperlipidemia) 11/19/2016  .  Hiatal hernia 05/01/2016  . History of bacterial pneumonia 05/01/2016  . COPD exacerbation ([place]) 11/18/2015  . Physical deconditioning 07/24/2013  . S/P AVR (aortic valve replacement) 07/23/2013  . S/P CABG x 1 07/23/2013  . Aortic stenosis 06/10/2013  . Essential hypertension 06/10/2013  . COPD GOLD II  10/26/2011    Alda Lea, PT 02/25/2018, 7:43 PM  [facility] [address] [place], [place], [address] Phone: [phone]   Fax:  [phone]  Name: MAKINZI PRIEUR MRN: [id number] Date of Birth: 21-Dec-1931

## 2018-02-28 ENCOUNTER — Ambulatory Visit: Payer: Medicare Other

## 2018-02-28 DIAGNOSIS — R2689 Other abnormalities of gait and mobility: Secondary | ICD-10-CM | POA: Diagnosis not present

## 2018-02-28 DIAGNOSIS — M6281 Muscle weakness (generalized): Secondary | ICD-10-CM

## 2018-02-28 DIAGNOSIS — R2681 Unsteadiness on feet: Secondary | ICD-10-CM

## 2018-02-28 NOTE — Therapy (Signed)
[facility] [address] [place], [place], [address] Phone: [phone]   Fax:  [phone]  Physical Therapy Treatment  Patient Details  Name: [name] MRN: [id number] Date of Birth: [birth year] Referring Provider: Dr. Dema Severin   Encounter Date: 02/28/2018  PT End of Session - 02/28/18 1357    Visit Number  8    Number of Visits  17    Date for PT Re-Evaluation  03/26/18    Authorization Type  [employer]HC Medicare    PT Start Time  1318    PT Stop Time  1358    PT Time Calculation (min)  40 min    Equipment Utilized During Treatment  -- min A to S prn    Activity Tolerance  Patient tolerated treatment well rest breaks to incr. SaO2 on 2L of O2    Behavior During Therapy  WFL for tasks assessed/performed       Past Medical History:  Diagnosis Date  . Anxiety   . Aortic stenosis   . Arthritis   . Asthma    uses inhaler as needed  . Depression   . Diabetes mellitus   . Emphysema of lung ([place])   . GERD (gastroesophageal reflux disease)   . Glaucoma   . Hyperlipidemia   . Hypertension   . [name]   . Murmur   . Nephrolithiasis   . Pneumonia 3/17 and 3/18    Past Surgical History:  Procedure Laterality Date  . ABDOMINAL HYSTERECTOMY    . ANKLE SURGERY  1998   d/t fx.  Left ankle  . ANTERIOR AND POSTERIOR REPAIR  06/21/2011   Procedure: ANTERIOR (CYSTOCELE) AND POSTERIOR REPAIR (RECTOCELE);  Surgeon: Maisie Fus;  Location: [facility];  Service: Gynecology;  Laterality: N/A;  . AORTIC VALVE REPLACEMENT N/A 07/17/2013   Procedure: AORTIC VALVE REPLACEMENT (AVR);  Surgeon: Ivin Poot, MD;  Location: [facility];  Service: Open Heart Surgery;  Laterality: N/A;  . CARDIAC CATHETERIZATION    . CARDIAC VALVE REPLACEMENT    . CORONARY ANGIOPLASTY    . CORONARY ARTERY BYPASS GRAFT N/A 07/17/2013   Procedure: CORONARY ARTERY BYPASS GRAFT, TIMES ONE, ON PUMP, USING RIGHT INTERNAL MAMMARY ARTERY.;   Surgeon: Ivin Poot, MD;  Location: [facility];  Service: Open Heart Surgery;  Laterality: N/A;  . FACIAL COSMETIC SURGERY     face lift  . INTRAOPERATIVE TRANSESOPHAGEAL ECHOCARDIOGRAM N/A 07/17/2013   Procedure: INTRAOPERATIVE TRANSESOPHAGEAL ECHOCARDIOGRAM;  Surgeon: Ivin Poot, MD;  Location: [facility];  Service: Open Heart Surgery;  Laterality: N/A;  . LAPAROSCOPIC ASSISTED VAGINAL HYSTERECTOMY  06/21/2011   Procedure: LAPAROSCOPIC ASSISTED VAGINAL HYSTERECTOMY;  Surgeon: Maisie Fus;  Location: [facility];  Service: Gynecology;  Laterality: N/A;  . LEFT AND RIGHT HEART CATHETERIZATION WITH CORONARY ANGIOGRAM N/A 06/27/2013   Procedure: LEFT AND RIGHT HEART CATHETERIZATION WITH CORONARY ANGIOGRAM;  Surgeon: Ramond Dial, MD;  Location: [facility];  Service: Cardiovascular;  Laterality: N/A;  . NOSE SURGERY    . SALPINGOOPHORECTOMY  06/21/2011   Procedure: SALPINGO OOPHERECTOMY;  Surgeon: Maisie Fus;  Location: [facility];  Service: Gynecology;  Laterality: Bilateral;  . VAGINAL PROLAPSE REPAIR  06/21/2011   Procedure: SACROPEXY;  Surgeon: Maisie Fus;  Location: [facility];  Service: Gynecology;  Laterality: N/A;  sacrospinous ligament suspension    There were no vitals filed for this visit.  Subjective Assessment - 02/28/18 1322    Subjective  Pt reported she was able to walk down every aisle at [employer] this morning but it wore her out. She ate lunch and now feels better. Pt stated dizziness has been better but she still feels it when she bends forward first thing in the morning, she has an appt. on 03/07/18 to check her blood.     Pertinent History  2L of SpO2, osteoporosis, COPD, aortic valve replacement, HTN, HLD, DM, depression, diverticulosis, incontinence, glaucoma, 02/2011: 40-50% carotid stenosis, emphysema, CKD stage 3, CAD, hiatal hernia    Patient Stated Goals  She'd love to stop using SpO2 as it is heavy, she'd like to walk without an AD.    Currently in Pain?  No/denies                        [facility] Adult PT Treatment/Exercise - 02/28/18 1326      Ambulation/Gait   Ambulation/Gait  Yes    Ambulation/Gait Assistance  5: Supervision;4: Min guard    Ambulation/Gait Assistance Details  Min guard to S over declines to ensure safety. Cues to improve heel strike and to stay within rollator for safety. SaO2 after amb. 87% and required pursed lip breathing with seated rest break to incr. >/=92%.    Ambulation Distance (Feet)  500 Feet outdoors, 120' indoors. 345'    Assistive device  Rollator    Gait Pattern  Step-through pattern;Decreased stride length;Decreased trunk rotation    Ambulation Surface  Level;Unlevel;Indoor;Outdoor;Paved    Stairs  Yes    Stairs Assistance  4: Min guard    Stair Management Technique  Two rails;Alternating pattern;Forwards    Number of Stairs  12    Height of Stairs  6    Gait Comments  Cues to improve ant. weight shifting to ascend steps and to place entire foot on step vs. ball of foot. Technique improved with cues.       High Level Balance   High Level Balance Activities  Side stepping;Braiding;Backward walking;Marching forwards    High Level Balance Comments  In //bars with min guard to S for safety, intermittent UE support, 4x10'/activity. Cues for technique. One LOB during backwards amb. with Min A to maintain balance and UE support on // bars.  SaO2 decr. to 86-87% and incr. to >/=90% with pursed lip breathing and seated rest break.             PT Education - 02/28/18 1322    Education provided  Yes    Education Details  PT educated pt on pursed lip breathing when SaO2 on 2L of O2 was 87% at rest, and it incr. to 92%. PT encouraged pt to use pulse ox at home to assess SaO2.    Person(s) Educated  Patient    Methods  Explanation    Comprehension  Verbalized understanding       PT Short Term Goals - 02/25/18 0941      PT SHORT TERM GOAL #1   Title  Pt will be IND in HEP to improve balance, endurance,  strength, and flexibility. TARGET DATE FOR ALL STGS: 02/22/18    Baseline  met 02-25-18    Status  Achieved      PT SHORT TERM GOAL #2   Title  Pt will improve DGI score to >/=16/24 to decr. falls risk.     Status  Achieved      PT SHORT TERM GOAL #3   Title  Pt will improve gait speed with  LRAD, not holding SpO2 machine to >/=2.62 ft/sec. to safely amb. in the community.       PT SHORT TERM GOAL #4   Title  Pt will report zero falls over the last 2 weeks to improve safety.       PT SHORT TERM GOAL #6   Title  Pt will amb. 500' with LRAD, not carrying SpO2 machine, at MOD I level to improve safety during functional mobility.       PT SHORT TERM GOAL #7   Title  Pt will improve 6MWT distance with LRAD to 934' to improve endurance.     Baseline  845' with rollator;  800' with rollator on 02-25-18     Period  Weeks    Status  Not Met        PT Long Term Goals - 02/08/18 1613      PT LONG TERM GOAL #1   Title  Pt will verbalize understanding fall prevention strategies to reduce falls risk. TARGET DATE FOR ALL LTGS: 03/22/18    Status  New      PT LONG TERM GOAL #2   Title  Pt will improve DGI score to >/=20/24 to decr. falls risk.     Status  New      PT LONG TERM GOAL #3   Title  Pt will amb. 600' over even/uneven terrain with LRAD, at MOD I level, including traversing curbs to improve safety during functional mobility.     Status  New      PT LONG TERM GOAL #4   Title  Pt will amb. 100', at MOD I level (slower speed), over even terrain to amb. safely at home.     Status  New      PT LONG TERM GOAL #5   Title  Pt will improve walking distance during 6MWT to 1022' to improve endurance.     Status  New            Plan - 02/28/18 1358    Clinical Impression Statement  Pt demonstrated progress as she was able to perform circuit training to improve endurance, gait, and balance. Pt did require seated rest breaks with pursed lip breathing to incr. SaO2 on 2L of O2. PT will   continue to benefit from skilled PT to improve safety during functional mobility.     Rehab Potential  Good    Clinical Impairments Affecting Rehab Potential  see above    PT Frequency  2x / week    PT Duration  8 weeks    PT Next Visit Plan  cont balance and gait training (circuit training, add bike and strengthening)    PT Home Exercise Plan  balance HEP given on 09-25-17    Consulted and Agree with Plan of Care  Patient       Patient will benefit from skilled therapeutic intervention in order to improve the following deficits and impairments:  Abnormal gait, Decreased endurance, Decreased knowledge of use of DME, Decreased balance, Decreased mobility, Impaired flexibility, Postural dysfunction, Decreased strength, Dizziness  Visit Diagnosis: Other abnormalities of gait and mobility  Unsteadiness on feet  Muscle weakness (generalized)     Problem List Patient Active Problem List   Diagnosis Date Noted  . Pulmonary nodule 12/03/2017  . Iron deficiency anemia   . Heme + stool   . CAD (coronary artery disease) 10/27/2017  . Microcytic anemia 10/27/2017  . Anemia due to GI blood loss 10/27/2017  . COPD  with acute exacerbation ([place]) 10/27/2017  . Pulmonary nodule, left 10/15/2017  . Anemia, chronic disease 12/07/2016  . Chronic obstructive pulmonary disease ([place])   . Acute on chronic respiratory failure with hypoxia ([place]) 11/19/2016  . CKD (chronic kidney disease), stage III ([place]) 11/19/2016  . Diabetes mellitus type 2, uncontrolled ([place]) 11/19/2016  . Recurrent pneumonia 11/19/2016  . Chronic diastolic CHF (congestive heart failure) ([place]) 11/19/2016  . Pulmonary HTN ([place]) 11/19/2016  . HLD (hyperlipidemia) 11/19/2016  . Hiatal hernia 05/01/2016  . History of bacterial pneumonia 05/01/2016  . COPD exacerbation ([place]) 11/18/2015  . Physical deconditioning 07/24/2013  . S/P AVR (aortic valve replacement) 07/23/2013  . S/P CABG x 1 07/23/2013  . Aortic stenosis 06/10/2013  .  Essential hypertension 06/10/2013  . COPD GOLD II  10/26/2011    Eniola Cerullo L 02/28/2018, 2:02 PM  [facility] [name] [place], [place], [address] Phone: [phone]   Fax:  [phone]  Name: [name] MRN: [id number] Date of Birth: April 29, 1932  Geoffry Paradise, PT,DPT 02/28/18 2:03 PM Phone: [phone] Fax: [phone]

## 2018-03-04 ENCOUNTER — Ambulatory Visit: Payer: Medicare Other

## 2018-03-04 DIAGNOSIS — R2689 Other abnormalities of gait and mobility: Secondary | ICD-10-CM | POA: Diagnosis not present

## 2018-03-04 DIAGNOSIS — R2681 Unsteadiness on feet: Secondary | ICD-10-CM

## 2018-03-04 DIAGNOSIS — M6281 Muscle weakness (generalized): Secondary | ICD-10-CM

## 2018-03-04 NOTE — Therapy (Signed)
[facility] [address] [place], [place], [address] Phone: [phone]   Fax:  [phone]  Physical Therapy Treatment  Patient Details  Name: [name] MRN: [id number] Date of Birth: 06/10/1932 Referring Provider: Dr. Dema Severin   Encounter Date: 03/04/2018  PT End of Session - 03/04/18 1210    Visit Number  9    Number of Visits  17    Date for PT Re-Evaluation  03/26/18    Authorization Type  [employer] Medicare    PT Start Time  1015    PT Stop Time  1056    PT Time Calculation (min)  41 min    Equipment Utilized During Treatment  -- S prn    Activity Tolerance  Patient tolerated treatment well;Treatment limited secondary to medical complications (Comment) rest breaks to allow SaO2 to incr. >90%    Behavior During Therapy  [facility] for tasks assessed/performed       Past Medical History:  Diagnosis Date  . Anxiety   . Aortic stenosis   . Arthritis   . Asthma    uses inhaler as needed  . Depression   . Diabetes mellitus   . Emphysema of lung ([place])   . GERD (gastroesophageal reflux disease)   . Glaucoma   . Hyperlipidemia   . Hypertension   . [name]   . Murmur   . Nephrolithiasis   . Pneumonia 3/17 and 3/18    Past Surgical History:  Procedure Laterality Date  . ABDOMINAL HYSTERECTOMY    . ANKLE SURGERY  1998   d/t fx.  Left ankle  . ANTERIOR AND POSTERIOR REPAIR  06/21/2011   Procedure: ANTERIOR (CYSTOCELE) AND POSTERIOR REPAIR (RECTOCELE);  Surgeon: Maisie Fus;  Location: [facility];  Service: Gynecology;  Laterality: N/A;  . AORTIC VALVE REPLACEMENT N/A 07/17/2013   Procedure: AORTIC VALVE REPLACEMENT (AVR);  Surgeon: Ivin Poot, MD;  Location: [facility];  Service: Open Heart Surgery;  Laterality: N/A;  . CARDIAC CATHETERIZATION    . CARDIAC VALVE REPLACEMENT    . CORONARY ANGIOPLASTY    . CORONARY ARTERY BYPASS GRAFT N/A 07/17/2013   Procedure: CORONARY ARTERY BYPASS GRAFT,  TIMES ONE, ON PUMP, USING RIGHT INTERNAL MAMMARY ARTERY.;  Surgeon: Ivin Poot, MD;  Location: [facility];  Service: Open Heart Surgery;  Laterality: N/A;  . FACIAL COSMETIC SURGERY     face lift  . INTRAOPERATIVE TRANSESOPHAGEAL ECHOCARDIOGRAM N/A 07/17/2013   Procedure: INTRAOPERATIVE TRANSESOPHAGEAL ECHOCARDIOGRAM;  Surgeon: Ivin Poot, MD;  Location: [facility];  Service: Open Heart Surgery;  Laterality: N/A;  . LAPAROSCOPIC ASSISTED VAGINAL HYSTERECTOMY  06/21/2011   Procedure: LAPAROSCOPIC ASSISTED VAGINAL HYSTERECTOMY;  Surgeon: Maisie Fus;  Location: [facility];  Service: Gynecology;  Laterality: N/A;  . LEFT AND RIGHT HEART CATHETERIZATION WITH CORONARY ANGIOGRAM N/A 06/27/2013   Procedure: LEFT AND RIGHT HEART CATHETERIZATION WITH CORONARY ANGIOGRAM;  Surgeon: Ramond Dial, MD;  [facility];  Service: Cardiovascular;  Laterality: N/A;  . NOSE SURGERY    . SALPINGOOPHORECTOMY  06/21/2011   Procedure: SALPINGO OOPHERECTOMY;  Surgeon: Maisie Fus;  Location: [facility];  Service: Gynecology;  Laterality: Bilateral;  . VAGINAL PROLAPSE REPAIR  06/21/2011   Procedure: SACROPEXY;  Surgeon: Maisie Fus;  Location: [facility];  Service: Gynecology;  Laterality: N/A;  sacrospinous ligament suspension    There were no vitals filed for this visit.  Subjective Assessment - 03/04/18 1019  Subjective  Pt denied falls since last visit. However, pt fell asleep for 3.5 hours yesterday as she was tired after celebrating Father's Day. She woke up and SaO2 was 87% when she woke up, put SpO2 back on and SaO2 incr. to 92%.     Pertinent History  2L of SpO2, osteoporosis, COPD, aortic valve replacement, HTN, HLD, DM, depression, diverticulosis, incontinence, glaucoma, 02/2011: 40-50% carotid stenosis, emphysema, CKD stage 3, CAD, hiatal hernia    Patient Stated Goals  She'd love to stop using SpO2 as it is heavy, she'd like to walk without an AD.    Currently in Pain?  No/denies                        [facility] Adult PT Treatment/Exercise - 03/04/18 1023      Ambulation/Gait   Ambulation/Gait  Yes    Ambulation/Gait Assistance  5: Supervision    Ambulation/Gait Assistance Details  Cues to improve heel strike, one bout of decr. foot clearance with incr. sway which pt corrected with UE support on rollator. SaO2 on 2L after amb. 84% with incr. to >/=90% after seated rest break and pursed lip breathing.     Ambulation Distance (Feet)  260 Feet x4    Assistive device  Rollator    Gait Pattern  Step-through pattern;Decreased stride length;Decreased trunk rotation    Ambulation Surface  Level;Indoor      Knee/Hip Exercises: Standing   Heel Raises  Both;20 reps    Hip Abduction  Stengthening;Both;1 set;10 reps    Other Standing Knee Exercises  B hip marches in standing x10/LE. all performed x3 sets, during circuit training (walk and then exercise). SaO2 93% after first round of exercise. Cues and demo for technique with 1-2 UE support on locked rollator. S for safety.               PT Short Term Goals - 02/25/18 0941      PT SHORT TERM GOAL #1   Title  Pt will be IND in HEP to improve balance, endurance, strength, and flexibility. TARGET DATE FOR ALL STGS: 02/22/18    Baseline  met 02-25-18    Status  Achieved      PT SHORT TERM GOAL #2   Title  Pt will improve DGI score to >/=16/24 to decr. falls risk.     Status  Achieved      PT SHORT TERM GOAL #3   Title  Pt will improve gait speed with LRAD, not holding SpO2 machine to >/=2.62 ft/sec. to safely amb. in the community.       PT SHORT TERM GOAL #4   Title  Pt will report zero falls over the last 2 weeks to improve safety.       PT SHORT TERM GOAL #6   Title  Pt will amb. 500' with LRAD, not carrying SpO2 machine, at MOD I level to improve safety during functional mobility.       PT SHORT TERM GOAL #7   Title  Pt will improve 6MWT distance with LRAD to 934' to improve endurance.     Baseline   845' with rollator;  800' with rollator on 02-25-18     Period  Weeks    Status  Not Met        PT Long Term Goals - 02/08/18 1613      PT LONG TERM GOAL #1   Title  Pt will verbalize understanding fall prevention  strategies to reduce falls risk. TARGET DATE FOR ALL LTGS: 03/22/18    Status  New      PT LONG TERM GOAL #2   Title  Pt will improve DGI score to >/=20/24 to decr. falls risk.     Status  New      PT LONG TERM GOAL #3   Title  Pt will amb. 600' over even/uneven terrain with LRAD, at MOD I level, including traversing curbs to improve safety during functional mobility.     Status  New      PT LONG TERM GOAL #4   Title  Pt will amb. 100', at MOD I level (slower speed), over even terrain to amb. safely at home.     Status  New      PT LONG TERM GOAL #5   Title  Pt will improve walking distance during 6MWT to 1022' to improve endurance.     Status  New            Plan - 03/04/18 1211    Clinical Impression Statement  Pt demonstrated progress, as she was able to perform circuit training with limited fatigue. Pt did experience decr. in SaO2 on 2L of O2 (84-87% after amb. and last bout of strength training), and required seated rest breaks and pursed lip breathing to allow SaO2 to incr. >/=90%. Pt would continue to benefit from skilled PT to improve safety during functional mobiltiy.     Rehab Potential  Good    Clinical Impairments Affecting Rehab Potential  see above    PT Frequency  2x / week    PT Duration  8 weeks    PT Next Visit Plan  cont balance and gait training (circuit training, add bike and strengthening) . Progress note!   PT Home Exercise Plan  balance HEP given on 09-25-17    Consulted and Agree with Plan of Care  Patient       Patient will benefit from skilled therapeutic intervention in order to improve the following deficits and impairments:  Abnormal gait, Decreased endurance, Decreased knowledge of use of DME, Decreased balance, Decreased mobility,  Impaired flexibility, Postural dysfunction, Decreased strength, Dizziness  Visit Diagnosis: Other abnormalities of gait and mobility  Unsteadiness on feet  Muscle weakness (generalized)     Problem List Patient Active Problem List   Diagnosis Date Noted  . Pulmonary nodule 12/03/2017  . Iron deficiency anemia   . Heme + stool   . CAD (coronary artery disease) 10/27/2017  . Microcytic anemia 10/27/2017  . Anemia due to GI blood loss 10/27/2017  . COPD with acute exacerbation ([place]) 10/27/2017  . Pulmonary nodule, left 10/15/2017  . Anemia, chronic disease 12/07/2016  . Chronic obstructive pulmonary disease ([place])   . Acute on chronic respiratory failure with hypoxia ([place]) 11/19/2016  . CKD (chronic kidney disease), stage III ([place]) 11/19/2016  . Diabetes mellitus type 2, uncontrolled ([place]) 11/19/2016  . Recurrent pneumonia 11/19/2016  . Chronic diastolic CHF (congestive heart failure) ([place]) 11/19/2016  . Pulmonary HTN ([place]) 11/19/2016  . HLD (hyperlipidemia) 11/19/2016  . Hiatal hernia 05/01/2016  . History of bacterial pneumonia 05/01/2016  . COPD exacerbation ([place]) 11/18/2015  . Physical deconditioning 07/24/2013  . S/P AVR (aortic valve replacement) 07/23/2013  . S/P CABG x 1 07/23/2013  . Aortic stenosis 06/10/2013  . Essential hypertension 06/10/2013  . COPD GOLD II  10/26/2011    Rachid Parham L 03/04/2018, 12:14 PM  [facility] [phone]  [place], [place], [address] Phone: [phone]   Fax:  [phone]  Name: LYNLEE STRATTON MRN: [id number] Date of Birth: [birth year]  Geoffry Paradise, PT,DPT 03/04/18 12:14 PM Phone: [phone] Fax: [phone]

## 2018-03-06 ENCOUNTER — Ambulatory Visit: Payer: Medicare Other

## 2018-03-06 DIAGNOSIS — R2689 Other abnormalities of gait and mobility: Secondary | ICD-10-CM | POA: Diagnosis not present

## 2018-03-06 DIAGNOSIS — R2681 Unsteadiness on feet: Secondary | ICD-10-CM

## 2018-03-06 DIAGNOSIS — M6281 Muscle weakness (generalized): Secondary | ICD-10-CM

## 2018-03-06 NOTE — Therapy (Addendum)
[facility] [address] [place], [place], [address] Phone: [phone]   Fax:  [phone]  Physical Therapy Treatment  Patient Details  Name: [name] MRN: [id number] Date of Birth: October 02, 1931 Referring Provider: Dr. Dema Severin   Encounter Date: 03/06/2018  PT End of Session - 03/06/18 1054    Visit Number  10    Number of Visits  17    Date for PT Re-Evaluation  03/26/18    Authorization Type  [employer]HC Medicare    PT Start Time  1018    PT Stop Time  1056    PT Time Calculation (min)  38 min    Activity Tolerance  Patient tolerated treatment well    Behavior During Therapy  [facility] for tasks assessed/performed       Past Medical History:  Diagnosis Date  . Anxiety   . Aortic stenosis   . Arthritis   . Asthma    uses inhaler as needed  . Depression   . Diabetes mellitus   . Emphysema of lung ([place])   . GERD (gastroesophageal reflux disease)   . Glaucoma   . Hyperlipidemia   . Hypertension   . [name]   . Murmur   . Nephrolithiasis   . Pneumonia 3/17 and 3/18    Past Surgical History:  Procedure Laterality Date  . ABDOMINAL HYSTERECTOMY    . ANKLE SURGERY  1998   d/t fx.  Left ankle  . ANTERIOR AND POSTERIOR REPAIR  06/21/2011   Procedure: ANTERIOR (CYSTOCELE) AND POSTERIOR REPAIR (RECTOCELE);  Surgeon: Maisie Fus;  Location: [facility];  Service: Gynecology;  Laterality: N/A;  . AORTIC VALVE REPLACEMENT N/A 07/17/2013   Procedure: AORTIC VALVE REPLACEMENT (AVR);  Surgeon: Ivin Poot, MD;  Location: [facility];  Service: Open Heart Surgery;  Laterality: N/A;  . CARDIAC CATHETERIZATION    . CARDIAC VALVE REPLACEMENT    . CORONARY ANGIOPLASTY    . CORONARY ARTERY BYPASS GRAFT N/A 07/17/2013   Procedure: CORONARY ARTERY BYPASS GRAFT, TIMES ONE, ON PUMP, USING RIGHT INTERNAL MAMMARY ARTERY.;  Surgeon: Ivin Poot, MD;  Location: [facility];  Service: Open Heart Surgery;  Laterality: N/A;   . FACIAL COSMETIC SURGERY     face lift  . INTRAOPERATIVE TRANSESOPHAGEAL ECHOCARDIOGRAM N/A 07/17/2013   Procedure: INTRAOPERATIVE TRANSESOPHAGEAL ECHOCARDIOGRAM;  Surgeon: Ivin Poot, MD;  Location: [facility];  Service: Open Heart Surgery;  Laterality: N/A;  . LAPAROSCOPIC ASSISTED VAGINAL HYSTERECTOMY  06/21/2011   Procedure: LAPAROSCOPIC ASSISTED VAGINAL HYSTERECTOMY;  Surgeon: Maisie Fus;  Location: [facility];  Service: Gynecology;  Laterality: N/A;  . LEFT AND RIGHT HEART CATHETERIZATION WITH CORONARY ANGIOGRAM N/A 06/27/2013   Procedure: LEFT AND RIGHT HEART CATHETERIZATION WITH CORONARY ANGIOGRAM;  Surgeon: Ramond Dial, MD;  Location: [facility] CATH LAB;  Service: Cardiovascular;  Laterality: N/A;  . NOSE SURGERY    . SALPINGOOPHORECTOMY  06/21/2011   Procedure: SALPINGO OOPHERECTOMY;  Surgeon: Maisie Fus;  Location: [facility];  Service: Gynecology;  Laterality: Bilateral;  . VAGINAL PROLAPSE REPAIR  06/21/2011   Procedure: SACROPEXY;  Surgeon: Maisie Fus;  Location: [facility];  Service: Gynecology;  Laterality: N/A;  sacrospinous ligament suspension    There were no vitals filed for this visit.  Subjective Assessment - 03/06/18 1022    Subjective  Pt denied falls or changes since last visit. Pt reported she has a bruised R forearm 2/2 screen door hitting arm while feeding  cat.     Pertinent History  2L of SpO2, osteoporosis, COPD, aortic valve replacement, HTN, HLD, DM, depression, diverticulosis, incontinence, glaucoma, 02/2011: 40-50% carotid stenosis, emphysema, CKD stage 3, CAD, hiatal hernia    Patient Stated Goals  She'd love to stop using SpO2 as it is heavy, she'd like to walk without an AD.    Currently in Pain?  No/denies                       [facility] Adult PT Treatment/Exercise - 03/06/18 1023      Ambulation/Gait   Ambulation/Gait  Yes    Ambulation/Gait Assistance  5: Supervision    Ambulation/Gait Assistance Details  SaO2 prior to amb.: 92% and HR:  63bpm. Pt demostrated improved heel strike today. SaO2 after first bout of amb. 91% and HR: 74bpm.    Ambulation Distance (Feet)  250 Feet x3 indoors and 500' outdoors.    Assistive device  Rollator    Gait Pattern  Step-through pattern;Decreased stride length;Decreased trunk rotation    Ambulation Surface  Level;Indoor      Exercises   Exercises  Knee/Hip      Knee/Hip Exercises: Aerobic   Recumbent Bike  Scifit x 5 minutes with UE and LE's to improve endurance and strength during circuit training. Cues to keep RPM > 50. SaO2 after 2.5 minutes: 92% and HR: 76bpm. Distance: 0.19 miles      Knee/Hip Exercises: Standing   Knee Flexion  Strengthening;Both;10 reps;1 set    Hip Abduction  Stengthening;Both;1 set;10 reps    Hip Extension  Stengthening;Both;1 set;10 reps;Knee straight    Other Standing Knee Exercises  All performed with B UE support on rollator with cues and demo for technique. Each bout of 10 performed x2 sets after amb. SaO2 90-91% after exercises and HR: 79bpm.               PT Short Term Goals - 02/25/18 0941      PT SHORT TERM GOAL #1   Title  Pt will be IND in HEP to improve balance, endurance, strength, and flexibility. TARGET DATE FOR ALL STGS: 02/22/18    Baseline  met 02-25-18    Status  Achieved      PT SHORT TERM GOAL #2   Title  Pt will improve DGI score to >/=16/24 to decr. falls risk.     Status  Achieved      PT SHORT TERM GOAL #3   Title  Pt will improve gait speed with LRAD, not holding SpO2 machine to >/=2.62 ft/sec. to safely amb. in the community.       PT SHORT TERM GOAL #4   Title  Pt will report zero falls over the last 2 weeks to improve safety.       PT SHORT TERM GOAL #6   Title  Pt will amb. 500' with LRAD, not carrying SpO2 machine, at MOD I level to improve safety during functional mobility.       PT SHORT TERM GOAL #7   Title  Pt will improve 6MWT distance with LRAD to 934' to improve endurance.     Baseline  845' with rollator;   800' with rollator on 02-25-18     Period  Weeks    Status  Not Met        PT Long Term Goals - 02/08/18 1613      PT LONG TERM GOAL #1   Title  Pt will verbalize  understanding fall prevention strategies to reduce falls risk. TARGET DATE FOR ALL LTGS: 03/22/18    Status  New      PT LONG TERM GOAL #2   Title  Pt will improve DGI score to >/=20/24 to decr. falls risk.     Status  New      PT LONG TERM GOAL #3   Title  Pt will amb. 600' over even/uneven terrain with LRAD, at MOD I level, including traversing curbs to improve safety during functional mobility.     Status  New      PT LONG TERM GOAL #4   Title  Pt will amb. 100', at MOD I level (slower speed), over even terrain to amb. safely at home.     Status  New      PT LONG TERM GOAL #5   Title  Pt will improve walking distance during 6MWT to 1022' to improve endurance.     Status  New            Plan - 03/06/18 1055    Clinical Impression Statement  Pt demonstrated progress today as she was able amb. and perform strength training with SaO2 on 2L of O2 remaining >/=90%. Pt did require a seated rest break after amb. outdoors 2/2 humidity. Continue with POC.     Rehab Potential  Good    Clinical Impairments Affecting Rehab Potential  see above    PT Frequency  2x / week    PT Duration  8 weeks    PT Next Visit Plan  cont balance and gait training (circuit training, add bike and strengthening)    PT Home Exercise Plan  balance HEP given on 09-25-17    Consulted and Agree with Plan of Care  Patient       Patient will benefit from skilled therapeutic intervention in order to improve the following deficits and impairments:  Abnormal gait, Decreased endurance, Decreased knowledge of use of DME, Decreased balance, Decreased mobility, Impaired flexibility, Postural dysfunction, Decreased strength, Dizziness  Visit Diagnosis: Other abnormalities of gait and mobility  Unsteadiness on feet  Muscle weakness  (generalized)     Problem List Patient Active Problem List   Diagnosis Date Noted  . Pulmonary nodule 12/03/2017  . Iron deficiency anemia   . Heme + stool   . CAD (coronary artery disease) 10/27/2017  . Microcytic anemia 10/27/2017  . Anemia due to GI blood loss 10/27/2017  . COPD with acute exacerbation ([place]) 10/27/2017  . Pulmonary nodule, left 10/15/2017  . Anemia, chronic disease 12/07/2016  . Chronic obstructive pulmonary disease ([place])   . Acute on chronic respiratory failure with hypoxia ([place]) 11/19/2016  . CKD (chronic kidney disease), stage III ([place]) 11/19/2016  . Diabetes mellitus type 2, uncontrolled ([place]) 11/19/2016  . Recurrent pneumonia 11/19/2016  . Chronic diastolic CHF (congestive heart failure) ([place]) 11/19/2016  . Pulmonary HTN ([place]) 11/19/2016  . HLD (hyperlipidemia) 11/19/2016  . Hiatal hernia 05/01/2016  . History of bacterial pneumonia 05/01/2016  . COPD exacerbation ([place]) 11/18/2015  . Physical deconditioning 07/24/2013  . S/P AVR (aortic valve replacement) 07/23/2013  . S/P CABG x 1 07/23/2013  . Aortic stenosis 06/10/2013  . Essential hypertension 06/10/2013  . COPD GOLD II  10/26/2011    Marian Grandt L 03/06/2018, 10:57 AM  [facility] [address] [place], [place], [address] Phone: [phone]   Fax:  [phone]  Name: ADELIN VENTRELLA MRN: [id number] Date of Birth: Apr 02, 1932  Progress Note Reporting Period 01/25/18 to 03/06/18  See note below for Objective Data and Assessment of Progress/Goals.   Objective date: PT Short Term Goals - 02/25/18 0941      PT SHORT TERM GOAL #1   Title  Pt will be IND in HEP to improve balance, endurance, strength, and flexibility. TARGET DATE FOR ALL STGS: 02/22/18    Baseline  met 02-25-18    Status  Achieved      PT SHORT TERM GOAL #2   Title  Pt will improve DGI score to >/=16/24 to decr. falls risk.     Status  Achieved      PT  SHORT TERM GOAL #3   Title  Pt will improve gait speed with LRAD, not holding SpO2 machine to >/=2.62 ft/sec. to safely amb. in the community.       PT SHORT TERM GOAL #4   Title  Pt will report zero falls over the last 2 weeks to improve safety.       PT SHORT TERM GOAL #6   Title  Pt will amb. 500' with LRAD, not carrying SpO2 machine, at MOD I level to improve safety during functional mobility.       PT SHORT TERM GOAL #7   Title  Pt will improve 6MWT distance with LRAD to 934' to improve endurance.     Baseline  845' with rollator;  800' with rollator on 02-25-18     Period  Weeks    Status  Not Met       Subjective on objective date: Pt reports she is able to amb. Longer distances with less shortness of breath.  PT has provided HEP and pt would continue to benefit from skilled PT in order to improve safety during functional mobility and incr. Endurance.     Geoffry Paradise, PT,DPT 03/06/18 10:57 AM Phone: [phone] Fax: [phone]

## 2018-03-12 ENCOUNTER — Ambulatory Visit: Payer: Medicare Other

## 2018-03-12 DIAGNOSIS — M6281 Muscle weakness (generalized): Secondary | ICD-10-CM

## 2018-03-12 DIAGNOSIS — R2681 Unsteadiness on feet: Secondary | ICD-10-CM

## 2018-03-12 DIAGNOSIS — R42 Dizziness and giddiness: Secondary | ICD-10-CM

## 2018-03-12 DIAGNOSIS — R2689 Other abnormalities of gait and mobility: Secondary | ICD-10-CM

## 2018-03-12 NOTE — Patient Instructions (Signed)
Hypotension As your heart beats, it forces blood through your body. This force is called blood pressure. If you have hypotension, you have low blood pressure. When your blood pressure is too low, you may not get enough blood to your brain. You may feel weak, feel light-headed, have a fast heartbeat, or even pass out (faint). Follow these instructions at home: Eating and drinking  Drink enough fluids to keep your pee (urine) clear or pale yellow.  Eat a healthy diet, and follow instructions from your doctor about eating or drinking restrictions. A healthy diet includes: ? Fresh fruits and vegetables. ? Whole grains. ? Low-fat (lean) meats. ? Low-fat dairy products.  Eat extra salt only as told. Do not add extra salt to your diet unless your doctor tells you to.  Eat small meals often.  Avoid standing up quickly after you eat. Medicines  Take over-the-counter and prescription medicines only as told by your doctor. ? Follow instructions from your doctor about changing how much you take (the dosage) of your medicines, if this applies. ? Do not stop or change your medicine on your own. General instructions  Wear compression stockings as told by your doctor.  Get up slowly from lying down or sitting.  Avoid hot showers and a lot of heat as told by your doctor.  Return to your normal activities as told by your doctor. Ask what activities are safe for you.  Do not use any products that contain nicotine or tobacco, such as cigarettes and e-cigarettes. If you need help quitting, ask your doctor.  Keep all follow-up visits as told by your doctor. This is important. Contact a doctor if:  You throw up (vomit).  You have watery poop (diarrhea).  You have a fever for more than 2-3 days.  You feel more thirsty than normal.  You feel weak and tired. Get help right away if:  You have chest pain.  You have a fast or irregular heartbeat.  You lose feeling (get numbness) in any part  of your body.  You cannot move your arms or your legs.  You have trouble talking.  You get sweaty or feel light-headed.  You faint.  You have trouble breathing.  You have trouble staying awake.  You feel confused. This information is not intended to replace advice given to you by your health care provider. Make sure you discuss any questions you have with your health care provider. Document Released: 11/29/2009 Document Revised: 05/23/2016 Document Reviewed: 05/23/2016 Elsevier Interactive Patient Education  2017 [employer]   

## 2018-03-12 NOTE — Therapy (Signed)
[facility] [address] [place], [place], [address] Phone: [phone]   Fax:  [phone]  Physical Therapy Treatment  Patient Details  Name: [name] MRN: [id number] Date of Birth: 1932/03/14 Referring Provider: Dr. Dema Severin   Encounter Date: 03/12/2018  PT End of Session - 03/12/18 1119    Visit Number  11    Number of Visits  17    Date for PT Re-Evaluation  03/26/18    Authorization Type  [employer] Medicare    PT Start Time  0931    PT Stop Time  1014    PT Time Calculation (min)  43 min    Equipment Utilized During Treatment  -- S prn    Activity Tolerance  Patient tolerated treatment well    Behavior During Therapy  [facility] for tasks assessed/performed       Past Medical History:  Diagnosis Date  . Anxiety   . Aortic stenosis   . Arthritis   . Asthma    uses inhaler as needed  . Depression   . Diabetes mellitus   . Emphysema of lung ([place])   . GERD (gastroesophageal reflux disease)   . Glaucoma   . Hyperlipidemia   . Hypertension   . [name]   . Murmur   . Nephrolithiasis   . Pneumonia 3/17 and 3/18    Past Surgical History:  Procedure Laterality Date  . ABDOMINAL HYSTERECTOMY    . ANKLE SURGERY  1998   d/t fx.  Left ankle  . ANTERIOR AND POSTERIOR REPAIR  06/21/2011   Procedure: ANTERIOR (CYSTOCELE) AND POSTERIOR REPAIR (RECTOCELE);  Surgeon: Maisie Fus;  Location: [facility];  Service: Gynecology;  Laterality: N/A;  . AORTIC VALVE REPLACEMENT N/A 07/17/2013   Procedure: AORTIC VALVE REPLACEMENT (AVR);  Surgeon: Ivin Poot, MD;  Location: [facility];  Service: Open Heart Surgery;  Laterality: N/A;  . CARDIAC CATHETERIZATION    . CARDIAC VALVE REPLACEMENT    . CORONARY ANGIOPLASTY    . CORONARY ARTERY BYPASS GRAFT N/A 07/17/2013   Procedure: CORONARY ARTERY BYPASS GRAFT, TIMES ONE, ON PUMP, USING RIGHT INTERNAL MAMMARY ARTERY.;  Surgeon: Ivin Poot, MD;  Location: [facility];  Service: Open Heart Surgery;  Laterality: N/A;  . FACIAL COSMETIC SURGERY     face lift  . INTRAOPERATIVE TRANSESOPHAGEAL ECHOCARDIOGRAM N/A 07/17/2013   Procedure: INTRAOPERATIVE TRANSESOPHAGEAL ECHOCARDIOGRAM;  Surgeon: Ivin Poot, MD;  Location: [facility];  Service: Open Heart Surgery;  Laterality: N/A;  . LAPAROSCOPIC ASSISTED VAGINAL HYSTERECTOMY  06/21/2011   Procedure: LAPAROSCOPIC ASSISTED VAGINAL HYSTERECTOMY;  Surgeon: Maisie Fus;  Location: [facility];  Service: Gynecology;  Laterality: N/A;  . LEFT AND RIGHT HEART CATHETERIZATION WITH CORONARY ANGIOGRAM N/A 06/27/2013   Procedure: LEFT AND RIGHT HEART CATHETERIZATION WITH CORONARY ANGIOGRAM;  Surgeon: Ramond Dial, MD;  [facility];  Service: Cardiovascular;  Laterality: N/A;  . NOSE SURGERY    . SALPINGOOPHORECTOMY  06/21/2011   Procedure: SALPINGO OOPHERECTOMY;  Surgeon: Maisie Fus;  Location: [facility];  Service: Gynecology;  Laterality: Bilateral;  . VAGINAL PROLAPSE REPAIR  06/21/2011   Procedure: SACROPEXY;  Surgeon: Maisie Fus;  Location: [facility];  Service: Gynecology;  Laterality: N/A;  sacrospinous ligament suspension    There were no vitals filed for this visit.  Subjective Assessment - 03/12/18 0936    Subjective  Pt denied falls since last visit. Pt reports she's been taking BP  at home and it has been a bit elevated. Pt saw Dr. Dema Severin on 03/07/18 and she incr. one of her BP meds to 330m as BP has been elevated. Pt still feels lightheaded when getting up and bending forward.     Pertinent History  2L of SpO2, osteoporosis, COPD, aortic valve replacement, HTN, HLD, DM, depression, diverticulosis, incontinence, glaucoma, 02/2011: 40-50% carotid stenosis, emphysema, CKD stage 3, CAD, hiatal hernia    Patient Stated Goals  She'd love to stop using SpO2 as it is heavy, she'd like to walk without an AD.    Currently in Pain?  No/denies             Vestibular Assessment - 03/12/18 0943       Orthostatics   BP supine (x 5 minutes)  171/63    HR supine (x 5 minutes)  63    BP sitting  160/57 lightheadedness upon sitting upright    HR sitting  63    BP standing (after 1 minute)  151/47    HR standing (after 1 minute)  64    Orthostatics Comment  BP decr. significantly with lightheadedness reported.               [facility]/Exercise - 03/12/18 0951      Ambulation/Gait   Ambulation/Gait  Yes    Ambulation/Gait Assistance  5: Supervision    Ambulation/Gait Assistance Details  SaO2 on 2L of O2 prior to amb.: 94% and HR: 66bpm. Intermittent cues to improve heel strike. SaO2 after amb: 89% with seated rest incr. to >90%. HR: 70bpm.    Ambulation Distance (Feet)  500 Feet 75', 40'x2    Assistive device  Rollator    Gait Pattern  Step-through pattern;Decreased stride length;Decreased trunk rotation    Ambulation Surface  Level;Indoor      Exercises   Exercises  Knee/Hip      Knee/Hip Exercises: Aerobic   Recumbent Bike  Scifit x 5 minutes with UE and LE's to improve endurance and strength during circuit training. Cues to keep RPM > 50. SaO2 after 2.5 minutes: 90% and HR: 72bpm. Distance: 0.19 miles. No c/o SOB. Pt required seated rest break after bike as SaO2 decr. to 85% but incr. with pursed lip breathing and rest.           Balance Exercises - 03/12/18 1009      Balance Exercises: Standing   Standing Eyes Opened  Narrow base of support (BOS);Head turns;Solid surface;5 reps    Standing Eyes Closed  Narrow base of support (BOS);Solid surface;2 reps;30 secs    Other Standing Exercises  Performed with S with mat behind pt and rollator in front of pt.         PT Education - 03/12/18 1121    Education provided  Yes    Education Details  PT discussed orthostatic hypotension findings.     Person(s) Educated  Patient    Methods  Explanation;Handout    Comprehension  Verbalized understanding       PT Short Term Goals - 02/25/18 0941      PT SHORT  TERM GOAL #1   Title  Pt will be IND in HEP to improve balance, endurance, strength, and flexibility. TARGET DATE FOR ALL STGS: 02/22/18    Baseline  met 02-25-18    Status  Achieved      PT SHORT TERM GOAL #2   Title  Pt will improve DGI score to >/=16/24 to decr. falls  risk.     Status  Achieved      PT SHORT TERM GOAL #3   Title  Pt will improve gait speed with LRAD, not holding SpO2 machine to >/=2.62 ft/sec. to safely amb. in the community.       PT SHORT TERM GOAL #4   Title  Pt will report zero falls over the last 2 weeks to improve safety.       PT SHORT TERM GOAL #6   Title  Pt will amb. 500' with LRAD, not carrying SpO2 machine, at MOD I level to improve safety during functional mobility.       PT SHORT TERM GOAL #7   Title  Pt will improve 6MWT distance with LRAD to 934' to improve endurance.     Baseline  845' with rollator;  800' with rollator on 02-25-18     Period  Weeks    Status  Not Met        PT Long Term Goals - 02/08/18 1613      PT LONG TERM GOAL #1   Title  Pt will verbalize understanding fall prevention strategies to reduce falls risk. TARGET DATE FOR ALL LTGS: 03/22/18    Status  New      PT LONG TERM GOAL #2   Title  Pt will improve DGI score to >/=20/24 to decr. falls risk.     Status  New      PT LONG TERM GOAL #3   Title  Pt will amb. 600' over even/uneven terrain with LRAD, at MOD I level, including traversing curbs to improve safety during functional mobility.     Status  New      PT LONG TERM GOAL #4   Title  Pt will amb. 100', at MOD I level (slower speed), over even terrain to amb. safely at home.     Status  New      PT LONG TERM GOAL #5   Title  Pt will improve walking distance during 6MWT to 1022' to improve endurance.     Status  New            Plan - 03/12/18 1120    Clinical Impression Statement  Pt c/o lightheadedness and elevated BP, therefore, PT assessed orthostatic hypotension. Pt experienced concordant lightheadedness  and a significant decr. in both systolic and diastolic BP during testing. PT provided pt education on hypotension. Pt continues to demonstrate progress, as her resting SaO2 was 95% and she required less seated rest breaks today.     Rehab Potential  Good    Clinical Impairments Affecting Rehab Potential  see above    PT Frequency  2x / week    PT Duration  8 weeks    PT Next Visit Plan  Assess BP. cont balance and gait training (circuit training, add bike and strengthening)    PT Home Exercise Plan  balance HEP given on 09-25-17    Consulted and Agree with Plan of Care  Patient       Patient will benefit from skilled therapeutic intervention in order to improve the following deficits and impairments:  Abnormal gait, Decreased endurance, Decreased knowledge of use of DME, Decreased balance, Decreased mobility, Impaired flexibility, Postural dysfunction, Decreased strength, Dizziness  Visit Diagnosis: Other abnormalities of gait and mobility  Muscle weakness (generalized)  Unsteadiness on feet  Dizziness and giddiness     Problem List Patient Active Problem List   Diagnosis Date Noted  . Pulmonary nodule  12/03/2017  . Iron deficiency anemia   . Heme + stool   . CAD (coronary artery disease) 10/27/2017  . Microcytic anemia 10/27/2017  . Anemia due to GI blood loss 10/27/2017  . COPD with acute exacerbation ([place]) 10/27/2017  . Pulmonary nodule, left 10/15/2017  . Anemia, chronic disease 12/07/2016  . Chronic obstructive pulmonary disease ([place])   . Acute on chronic respiratory failure with hypoxia ([place]) 11/19/2016  . CKD (chronic kidney disease), stage III ([place]) 11/19/2016  . Diabetes mellitus type 2, uncontrolled ([place]) 11/19/2016  . Recurrent pneumonia 11/19/2016  . Chronic diastolic CHF (congestive heart failure) ([place]) 11/19/2016  . Pulmonary HTN ([name]) 11/19/2016  . HLD (hyperlipidemia) 11/19/2016  . Hiatal hernia 05/01/2016  . History of bacterial pneumonia 05/01/2016  .  COPD exacerbation ([place]) 11/18/2015  . Physical deconditioning 07/24/2013  . S/P AVR (aortic valve replacement) 07/23/2013  . S/P CABG x 1 07/23/2013  . Aortic stenosis 06/10/2013  . Essential hypertension 06/10/2013  . COPD GOLD II  10/26/2011    Krystian Ferrentino L 03/12/2018, 11:23 AM  [facility] [address] [place], [place], [address] Phone: [phone]   Fax:  [phone]  Name: [name] MRN: [id number] Date of Birth: Nov 24, 1931  Geoffry Paradise, PT,DPT 03/12/18 11:23 AM Phone: [phone] Fax: [phone]

## 2018-03-14 ENCOUNTER — Ambulatory Visit: Payer: Medicare Other

## 2018-03-14 VITALS — BP 162/70 | HR 55

## 2018-03-14 DIAGNOSIS — R2681 Unsteadiness on feet: Secondary | ICD-10-CM

## 2018-03-14 DIAGNOSIS — R2689 Other abnormalities of gait and mobility: Secondary | ICD-10-CM

## 2018-03-14 DIAGNOSIS — M6281 Muscle weakness (generalized): Secondary | ICD-10-CM

## 2018-03-14 NOTE — Therapy (Signed)
[facility] [address] [place], [place], [address] Phone: [phone]   Fax:  [phone]  Physical Therapy Treatment  Patient Details  Name: [name] MRN: [id number] Date of Birth: 16-Feb-1932 Referring Provider: Dr. Dema Severin   Encounter Date: 03/14/2018  PT End of Session - 03/14/18 1016    Visit Number  12    Number of Visits  17    Date for PT Re-Evaluation  03/26/18    Authorization Type  [employer]HC Medicare    PT Start Time  0935    PT Stop Time  1014    PT Time Calculation (min)  39 min    Equipment Utilized During Treatment  -- S prn    Activity Tolerance  Patient tolerated treatment well    Behavior During Therapy  [facility] for tasks assessed/performed       Past Medical History:  Diagnosis Date  . Anxiety   . Aortic stenosis   . Arthritis   . Asthma    uses inhaler as needed  . Depression   . Diabetes mellitus   . Emphysema of lung ([place])   . GERD (gastroesophageal reflux disease)   . Glaucoma   . Hyperlipidemia   . Hypertension   . [name]   . Murmur   . Nephrolithiasis   . Pneumonia 3/17 and 3/18    Past Surgical History:  Procedure Laterality Date  . ABDOMINAL HYSTERECTOMY    . ANKLE SURGERY  1998   d/t fx.  Left ankle  . ANTERIOR AND POSTERIOR REPAIR  06/21/2011   Procedure: ANTERIOR (CYSTOCELE) AND POSTERIOR REPAIR (RECTOCELE);  Surgeon: Maisie Fus;  Location: [facility];  Service: Gynecology;  Laterality: N/A;  . AORTIC VALVE REPLACEMENT N/A 07/17/2013   Procedure: AORTIC VALVE REPLACEMENT (AVR);  Surgeon: Ivin Poot, MD;  Location: [facility];  Service: Open Heart Surgery;  Laterality: N/A;  . CARDIAC CATHETERIZATION    . CARDIAC VALVE REPLACEMENT    . CORONARY ANGIOPLASTY    . CORONARY ARTERY BYPASS GRAFT N/A 07/17/2013   Procedure: CORONARY ARTERY BYPASS GRAFT, TIMES ONE, ON PUMP, USING RIGHT INTERNAL MAMMARY ARTERY.;  Surgeon: Ivin Poot, MD;  Location: [facility];  Service: Open Heart Surgery;  Laterality: N/A;  . FACIAL COSMETIC SURGERY     face lift  . INTRAOPERATIVE TRANSESOPHAGEAL ECHOCARDIOGRAM N/A 07/17/2013   Procedure: INTRAOPERATIVE TRANSESOPHAGEAL ECHOCARDIOGRAM;  Surgeon: Ivin Poot, MD;  Location: [facility];  Service: Open Heart Surgery;  Laterality: N/A;  . LAPAROSCOPIC ASSISTED VAGINAL HYSTERECTOMY  06/21/2011   Procedure: LAPAROSCOPIC ASSISTED VAGINAL HYSTERECTOMY;  Surgeon: Maisie Fus;  Location: [facility];  Service: Gynecology;  Laterality: N/A;  . LEFT AND RIGHT HEART CATHETERIZATION WITH CORONARY ANGIOGRAM N/A 06/27/2013   Procedure: LEFT AND RIGHT HEART CATHETERIZATION WITH CORONARY ANGIOGRAM;  Surgeon: Ramond Dial, MD;  Location: [facility] CATH LAB;  Service: Cardiovascular;  Laterality: N/A;  . NOSE SURGERY    . SALPINGOOPHORECTOMY  06/21/2011   Procedure: SALPINGO OOPHERECTOMY;  Surgeon: Maisie Fus;  Location: [facility];  Service: Gynecology;  Laterality: Bilateral;  . VAGINAL PROLAPSE REPAIR  06/21/2011   Procedure: SACROPEXY;  Surgeon: Maisie Fus;  Location: [facility];  Service: Gynecology;  Laterality: N/A;  sacrospinous ligament suspension    Vitals:   03/14/18 0939 03/14/18 0956 03/14/18 1013  BP: (!) 157/63 (!) 159/48 (!) 162/70  Pulse: (!) 55 65 (!) 55  SpO2: 94% 90% 90%  Subjective Assessment - 03/14/18 0938    Subjective  Pt reported her BP has been elevated today (178/100) this morning after medications. Pt feels woozy. Pt's BP mail order prescription has not yet arrived.     Pertinent History  2L of SpO2, osteoporosis, COPD, aortic valve replacement, HTN, HLD, DM, depression, diverticulosis, incontinence, glaucoma, 02/2011: 40-50% carotid stenosis, emphysema, CKD stage 3, CAD, hiatal hernia    Patient Stated Goals  She'd love to stop using SpO2 as it is heavy, she'd like to walk without an AD.    Currently in Pain?  No/denies                       [facility] Adult PT Treatment/Exercise - 03/14/18  1000      Ambulation/Gait   Ambulation/Gait  Yes    Ambulation/Gait Assistance  5: Supervision    Ambulation/Gait Assistance Details  SaO2 on 2L of O2 monitored during session. Pt reported she felt less dizzy after amb. Cues to improve heel strike over paved surfaces. SaO2 decr. to 88-89% and incr. to 90% with seated rest break and pursed lip breathing.     Ambulation Distance (Feet)  400 Feet outdoors and 250' and 75' indoors.     Assistive device  Rollator    Gait Pattern  Step-through pattern;Decreased stride length;Decreased trunk rotation    Ambulation Surface  Level;Unlevel;Indoor;Outdoor;Paved      Exercises   Exercises  Knee/Hip      Knee/Hip Exercises: Standing   Knee Flexion  Strengthening;Both;10 reps;1 set    Hip Flexion  Stengthening;Both;1 set;10 reps;Knee bent marches    Hip Abduction  Stengthening;Both;1 set;10 reps    Hip Extension  Stengthening;Both;1 set;10 reps;Knee straight    Other Standing Knee Exercises  All performed in standing with BUE support on rollator with cues for technique during hip ext and knee flexion. SaO2 after exercise: 93% and HR: 66bpm.             PT Education - 03/14/18 1015    Education provided  Yes    Education Details  PT encouraged pt to contact Dr. Dema Severin to notify her of elevated BP and to inform her new BP meds have not yet arrived. PT discussed likelihood of next week being last week based on goal progress.     Person(s) Educated  Patient    Methods  Explanation    Comprehension  Verbalized understanding       PT Short Term Goals - 02/25/18 0941      PT SHORT TERM GOAL #1   Title  Pt will be IND in HEP to improve balance, endurance, strength, and flexibility. TARGET DATE FOR ALL STGS: 02/22/18    Baseline  met 02-25-18    Status  Achieved      PT SHORT TERM GOAL #2   Title  Pt will improve DGI score to >/=16/24 to decr. falls risk.     Status  Achieved      PT SHORT TERM GOAL #3   Title  Pt will improve gait speed with  LRAD, not holding SpO2 machine to >/=2.62 ft/sec. to safely amb. in the community.       PT SHORT TERM GOAL #4   Title  Pt will report zero falls over the last 2 weeks to improve safety.       PT SHORT TERM GOAL #6   Title  Pt will amb. 500' with LRAD, not carrying SpO2 machine, at MOD I  level to improve safety during functional mobility.       PT SHORT TERM GOAL #7   Title  Pt will improve 6MWT distance with LRAD to 934' to improve endurance.     Baseline  845' with rollator;  800' with rollator on 02-25-18     Period  Weeks    Status  Not Met        PT Long Term Goals - 02/08/18 1613      PT LONG TERM GOAL #1   Title  Pt will verbalize understanding fall prevention strategies to reduce falls risk. TARGET DATE FOR ALL LTGS: 03/22/18    Status  New      PT LONG TERM GOAL #2   Title  Pt will improve DGI score to >/=20/24 to decr. falls risk.     Status  New      PT LONG TERM GOAL #3   Title  Pt will amb. 600' over even/uneven terrain with LRAD, at MOD I level, including traversing curbs to improve safety during functional mobility.     Status  New      PT LONG TERM GOAL #4   Title  Pt will amb. 100', at MOD I level (slower speed), over even terrain to amb. safely at home.     Status  New      PT LONG TERM GOAL #5   Title  Pt will improve walking distance during 6MWT to 1022' to improve endurance.     Status  New            Plan - 03/14/18 1017    Clinical Impression Statement  Pt continues to demonstrate progress, as she was able to perform gait and strengthening exercises with SaO2 mainly remaining above 90%. Pt's systolic BP was elevated during session but pt reported wooziness improved after walking and exercises. PT will continue to monitor. Conitnue witH POC.     Rehab Potential  Good    Clinical Impairments Affecting Rehab Potential  see above    PT Frequency  2x / week    PT Duration  8 weeks    PT Next Visit Plan  Assess BP. Begin to assess LTGs.     PT Home  Exercise Plan  balance HEP given on 09-25-17    Consulted and Agree with Plan of Care  Patient       Patient will benefit from skilled therapeutic intervention in order to improve the following deficits and impairments:  Abnormal gait, Decreased endurance, Decreased knowledge of use of DME, Decreased balance, Decreased mobility, Impaired flexibility, Postural dysfunction, Decreased strength, Dizziness  Visit Diagnosis: Other abnormalities of gait and mobility  Muscle weakness (generalized)  Unsteadiness on feet     Problem List Patient Active Problem List   Diagnosis Date Noted  . Pulmonary nodule 12/03/2017  . Iron deficiency anemia   . Heme + stool   . CAD (coronary artery disease) 10/27/2017  . Microcytic anemia 10/27/2017  . Anemia due to GI blood loss 10/27/2017  . COPD with acute exacerbation ([place]) 10/27/2017  . Pulmonary nodule, left 10/15/2017  . Anemia, chronic disease 12/07/2016  . Chronic obstructive pulmonary disease ([place])   . Acute on chronic respiratory failure with hypoxia ([place]) 11/19/2016  . CKD (chronic kidney disease), stage III ([place]) 11/19/2016  . Diabetes mellitus type 2, uncontrolled ([place]) 11/19/2016  . Recurrent pneumonia 11/19/2016  . Chronic diastolic CHF (congestive heart failure) ([place]) 11/19/2016  . Pulmonary HTN ([place]) 11/19/2016  .  HLD (hyperlipidemia) 11/19/2016  . Hiatal hernia 05/01/2016  . History of bacterial pneumonia 05/01/2016  . COPD exacerbation ([place]) 11/18/2015  . Physical deconditioning 07/24/2013  . S/P AVR (aortic valve replacement) 07/23/2013  . S/P CABG x 1 07/23/2013  . Aortic stenosis 06/10/2013  . Essential hypertension 06/10/2013  . COPD GOLD II  10/26/2011    [name] L 03/14/2018, 10:19 AM  [facility] [address] [place], [place], [address] Phone: [phone]   Fax:  [phone]  Name: JERYN BERTONI MRN: [id number] Date of Birth:  [birth year]  Geoffry Paradise, PT,DPT 03/14/18 10:19 AM Phone: [phone] Fax: [phone]

## 2018-03-18 ENCOUNTER — Ambulatory Visit: Payer: Medicare Other | Attending: Family Medicine | Admitting: Physical Therapy

## 2018-03-18 ENCOUNTER — Encounter: Payer: Self-pay | Admitting: Physical Therapy

## 2018-03-18 VITALS — BP 134/64 | HR 61

## 2018-03-18 DIAGNOSIS — R2681 Unsteadiness on feet: Secondary | ICD-10-CM | POA: Diagnosis present

## 2018-03-18 DIAGNOSIS — R2689 Other abnormalities of gait and mobility: Secondary | ICD-10-CM | POA: Insufficient documentation

## 2018-03-18 DIAGNOSIS — M6281 Muscle weakness (generalized): Secondary | ICD-10-CM | POA: Diagnosis present

## 2018-03-18 NOTE — Therapy (Signed)
[facility] [address] [place], [place], [address] Phone: [phone]   Fax:  [phone]  Physical Therapy Treatment  Patient Details  Name: [name] MRN: [id number] Date of Birth: [birth year] Referring Provider: Dr. Dema Severin   Encounter Date: 03/18/2018  PT End of Session - 03/18/18 2044    Visit Number  13    Number of Visits  17    Date for PT Re-Evaluation  03/26/18    Authorization Type  [employer] Medicare    PT Start Time  0932    PT Stop Time  1016    PT Time Calculation (min)  44 min       Past Medical History:  Diagnosis Date  . Anxiety   . Aortic stenosis   . Arthritis   . Asthma    uses inhaler as needed  . Depression   . Diabetes mellitus   . Emphysema of lung ([place])   . GERD (gastroesophageal reflux disease)   . Glaucoma   . Hyperlipidemia   . Hypertension   . [name]   . Murmur   . Nephrolithiasis   . Pneumonia 3/17 and 3/18    Past Surgical History:  Procedure Laterality Date  . ABDOMINAL HYSTERECTOMY    . ANKLE SURGERY  1998   d/t fx.  Left ankle  . ANTERIOR AND POSTERIOR REPAIR  06/21/2011   Procedure: ANTERIOR (CYSTOCELE) AND POSTERIOR REPAIR (RECTOCELE);  Surgeon: Maisie Fus;  Location: [facility];  Service: Gynecology;  Laterality: N/A;  . AORTIC VALVE REPLACEMENT N/A 07/17/2013   Procedure: AORTIC VALVE REPLACEMENT (AVR);  Surgeon: Ivin Poot, MD;  Location: [facility];  Service: Open Heart Surgery;  Laterality: N/A;  . CARDIAC CATHETERIZATION    . CARDIAC VALVE REPLACEMENT    . CORONARY ANGIOPLASTY    . CORONARY ARTERY BYPASS GRAFT N/A 07/17/2013   Procedure: CORONARY ARTERY BYPASS GRAFT, TIMES ONE, ON PUMP, USING RIGHT INTERNAL MAMMARY ARTERY.;  Surgeon: Ivin Poot, MD;  Location: [facility];  Service: Open Heart Surgery;  Laterality: N/A;  . FACIAL COSMETIC SURGERY     face lift  . INTRAOPERATIVE TRANSESOPHAGEAL ECHOCARDIOGRAM N/A 07/17/2013    Procedure: INTRAOPERATIVE TRANSESOPHAGEAL ECHOCARDIOGRAM;  Surgeon: Ivin Poot, MD;  Location: [facility];  Service: Open Heart Surgery;  Laterality: N/A;  . LAPAROSCOPIC ASSISTED VAGINAL HYSTERECTOMY  06/21/2011   Procedure: LAPAROSCOPIC ASSISTED VAGINAL HYSTERECTOMY;  Surgeon: Maisie Fus;  Location: [facility];  Service: Gynecology;  Laterality: N/A;  . LEFT AND RIGHT HEART CATHETERIZATION WITH CORONARY ANGIOGRAM N/A 06/27/2013   Procedure: LEFT AND RIGHT HEART CATHETERIZATION WITH CORONARY ANGIOGRAM;  Surgeon: Ramond Dial, MD;  Location: [facility];  Service: Cardiovascular;  Laterality: N/A;  . NOSE SURGERY    . SALPINGOOPHORECTOMY  06/21/2011   Procedure: SALPINGO OOPHERECTOMY;  Surgeon: Maisie Fus;  Location: [facility];  Service: Gynecology;  Laterality: Bilateral;  . VAGINAL PROLAPSE REPAIR  06/21/2011   Procedure: SACROPEXY;  Surgeon: Maisie Fus;  Location: [facility];  Service: Gynecology;  Laterality: N/A;  sacrospinous ligament suspension    Vitals:   03/18/18 0944  BP: 134/64  Pulse: 61    Subjective Assessment - 03/18/18 2025    Subjective  Pt states she got her BP medication by mail order since last visit - has been taking medication for past 3 days and BP readings have been good; pt states she has not been quite as dizzy as she  was     Pertinent History  2L of SpO2, osteoporosis, COPD, aortic valve replacement, HTN, HLD, DM, depression, diverticulosis, incontinence, glaucoma, 02/2011: 40-50% carotid stenosis, emphysema, CKD stage 3, CAD, hiatal hernia    Patient Stated Goals  She'd love to stop using SpO2 as it is heavy, she'd like to walk without an AD.    Currently in Pain?  No/denies                       [facility] Adult PT Treatment/Exercise - 03/18/18 0948      Transfers   Transfers  Sit to Stand    Number of Reps  Other reps (comment) 5    Comments  no UE support used - cues to shift weight anteriorly to prevent LOB posteriorly       Ambulation/Gait    Ambulation/Gait  Yes    Ambulation/Gait Assistance  5: Supervision    Ambulation Distance (Feet)  810.8 Feet in 6" walk test - pt using 2L O2    Assistive device  Rollator    Gait Pattern  Step-through pattern;Decreased stride length;Decreased trunk rotation    Ambulation Surface  Level;Indoor    Ramp  5: Supervision    Curb  4: Min assist min assist initially with negotiation of rollator    Curb Details (indicate cue type and reason)  technique for negotiating wheels of rollator on/off curb to prevent need for lifting rollator on/off curb       Self-Care   Self-Care  Other Self-Care Comments Fall prevention techniques - handout given      Knee/Hip Exercises: Standing   Heel Raises  Both;1 set;10 reps;2 seconds          Balance Exercises - 03/18/18 2041      Balance Exercises: Standing   Stepping Strategy  Anterior;Lateral;5 reps no UE support used     Other Standing Exercises  Pt performed steppig over wooden balance beam x 2 reps with CGA without device        Pt performed standing balance exercises on blue mat for compliant surface training; marching in place x 10 reps each, Forward, back and side kicks x 10 reps each leg with CGA to min assist; crossovers (front only) alternating LE's 5 reps  each with min assist for recovery of LOB   PT Education - 03/18/18 2043    Education provided  Yes    Education Details  Pt given fall prevention handout on 03-18-18    Person(s) Educated  Patient    Methods  Explanation;Handout    Comprehension  Verbalized understanding       PT Short Term Goals - 02/25/18 0941      PT SHORT TERM GOAL #1   Title  Pt will be IND in HEP to improve balance, endurance, strength, and flexibility. TARGET DATE FOR ALL STGS: 02/22/18    Baseline  met 02-25-18    Status  Achieved      PT SHORT TERM GOAL #2   Title  Pt will improve DGI score to >/=16/24 to decr. falls risk.     Status  Achieved      PT SHORT TERM GOAL #3   Title  Pt will improve  gait speed with LRAD, not holding SpO2 machine to >/=2.62 ft/sec. to safely amb. in the community.       PT SHORT TERM GOAL #4   Title  Pt will report zero falls over the last 2 weeks to  improve safety.       PT SHORT TERM GOAL #6   Title  Pt will amb. 500' with LRAD, not carrying SpO2 machine, at MOD I level to improve safety during functional mobility.       PT SHORT TERM GOAL #7   Title  Pt will improve 6MWT distance with LRAD to 934' to improve endurance.     Baseline  845' with rollator;  800' with rollator on 02-25-18     Period  Weeks    Status  Not Met        PT Long Term Goals - 03/18/18 1630      PT LONG TERM GOAL #1   Title  Pt will verbalize understanding fall prevention strategies to reduce falls risk. TARGET DATE FOR ALL LTGS: 03/22/18    Baseline  handout given 03-18-18 - will assess understanding at next scheduled visit on 03-20-18      PT LONG TERM GOAL #2   Title  Pt will improve DGI score to >/=20/24 to decr. falls risk.       PT LONG TERM GOAL #3   Title  Pt will amb. 600' over even/uneven terrain with LRAD, at MOD I level, including traversing curbs to improve safety during functional mobility.       PT LONG TERM GOAL #4   Title  Pt will amb. 100', at MOD I level (slower speed), over even terrain to amb. safely at home.     Baseline  met 03-18-18    Status  Achieved      PT LONG TERM GOAL #5   Title  Pt will improve walking distance during 6MWT to 1022' to improve endurance.     Baseline  810.8' with rollator on 03-18-18; 2 occurrences of toes catching floor but pt able to independently recover LOB    Status  Not Met            Plan - 03/18/18 2044    Clinical Impression Statement  Pt has met LTG #4;  #5 not met as distance in 6" walk test was 810.8' , not 1022' per stated LTG. Information on fall prevention for LTG #1 was given to pt today - understanding of this information to be assessed next visit.  Pt requested not to walk outside today for LTG#3  assessment due to heat.  Pt feels that she is ready for discharge from PT this week.     Rehab Potential  Good    Clinical Impairments Affecting Rehab Potential  see above    PT Frequency  2x / week    PT Duration  8 weeks    PT Treatment/Interventions  ADLs/Self Care Home Management;Biofeedback;Therapeutic exercise;Manual techniques;Therapeutic activities;Functional mobility training;Stair training;Gait training;Patient/family education;Orthotic Fit/Training;DME Instruction;Neuromuscular re-education;Balance training;Vestibular;Canalith Repostioning    PT Next Visit Plan  Check LTG's #1 (fall prevention handout given on 03-18-18): check LTG's #2 and 3 (pt requested not to walk outside today due to heat);  D/C next session    PT Home Exercise Plan  balance HEP given on 09-25-17    Consulted and Agree with Plan of Care  Patient       Patient will benefit from skilled therapeutic intervention in order to improve the following deficits and impairments:  Abnormal gait, Decreased endurance, Decreased knowledge of use of DME, Decreased balance, Decreased mobility, Impaired flexibility, Postural dysfunction, Decreased strength, Dizziness  Visit Diagnosis: Other abnormalities of gait and mobility  Muscle weakness (generalized)  Unsteadiness on feet  Problem List Patient Active Problem List   Diagnosis Date Noted  . Pulmonary nodule 12/03/2017  . Iron deficiency anemia   . Heme + stool   . CAD (coronary artery disease) 10/27/2017  . Microcytic anemia 10/27/2017  . Anemia due to GI blood loss 10/27/2017  . COPD with acute exacerbation ([place]) 10/27/2017  . Pulmonary nodule, left 10/15/2017  . Anemia, chronic disease 12/07/2016  . Chronic obstructive pulmonary disease ([place])   . Acute on chronic respiratory failure with hypoxia ([place]) 11/19/2016  . CKD (chronic kidney disease), stage III ([place]) 11/19/2016  . Diabetes mellitus type 2, uncontrolled ([place]) 11/19/2016  . Recurrent pneumonia  11/19/2016  . Chronic diastolic CHF (congestive heart failure) ([place]) 11/19/2016  . Pulmonary HTN ([place]) 11/19/2016  . HLD (hyperlipidemia) 11/19/2016  . Hiatal hernia 05/01/2016  . History of bacterial pneumonia 05/01/2016  . COPD exacerbation ([place]) 11/18/2015  . Physical deconditioning 07/24/2013  . S/P AVR (aortic valve replacement) 07/23/2013  . S/P CABG x 1 07/23/2013  . Aortic stenosis 06/10/2013  . Essential hypertension 06/10/2013  . COPD GOLD II  10/26/2011    Alda Lea, PT 03/18/2018, 9:11 PM  [facility] [address] [place], [place], [address] Phone: [phone]   Fax:  [phone]  Name: TYLIYAH MCMEEKIN MRN: [id number] Date of Birth: Aug 27, 1932

## 2018-03-18 NOTE — Patient Instructions (Signed)
Fall Prevention in the Home Falls can cause injuries and can affect people from all age groups. There are many simple things that you can do to make your home safe and to help prevent falls. What can I do on the outside of my home?  Regularly repair the edges of walkways and driveways and fix any cracks.  Remove high doorway thresholds.  Trim any shrubbery on the main path into your home.  Use bright outdoor lighting.  Clear walkways of debris and clutter, including tools and rocks.  Regularly check that handrails are securely fastened and in good repair. Both sides of any steps should have handrails.  Install guardrails along the edges of any raised decks or porches.  Have leaves, snow, and ice cleared regularly.  Use sand or salt on walkways during winter months.  In the garage, clean up any spills right away, including grease or oil spills. What can I do in the bathroom?  Use night lights.  Install grab bars by the toilet and in the tub and shower. Do not use towel bars as grab bars.  Use non-skid mats or decals on the floor of the tub or shower.  If you need to sit down while you are in the shower, use a plastic, non-slip stool.  Keep the floor dry. Immediately clean up any water that spills on the floor.  Remove soap buildup in the tub or shower on a regular basis.  Attach bath mats securely with double-sided non-slip rug tape.  Remove throw rugs and other tripping hazards from the floor. What can I do in the bedroom?  Use night lights.  Make sure that a bedside light is easy to reach.  Do not use oversized bedding that drapes onto the floor.  Have a firm chair that has side arms to use for getting dressed.  Remove throw rugs and other tripping hazards from the floor. What can I do in the kitchen?  Clean up any spills right away.  Avoid walking on wet floors.  Place frequently used items in easy-to-reach places.  If you need to reach for something above  you, use a sturdy step stool that has a grab bar.  Keep electrical cables out of the way.  Do not use floor polish or wax that makes floors slippery. If you have to use wax, make sure that it is non-skid floor wax.  Remove throw rugs and other tripping hazards from the floor. What can I do in the stairways?  Do not leave any items on the stairs.  Make sure that there are handrails on both sides of the stairs. Fix handrails that are broken or loose. Make sure that handrails are as long as the stairways.  Check any carpeting to make sure that it is firmly attached to the stairs. Fix any carpet that is loose or worn.  Avoid having throw rugs at the top or bottom of stairways, or secure the rugs with carpet tape to prevent them from moving.  Make sure that you have a light switch at the top of the stairs and the bottom of the stairs. If you do not have them, have them installed. What are some other fall prevention tips?  Wear closed-toe shoes that fit well and support your feet. Wear shoes that have rubber soles or low heels.  When you use a stepladder, make sure that it is completely opened and that the sides are firmly locked. Have someone hold the ladder while you are using   it. Do not climb a closed stepladder.  Add color or contrast paint or tape to grab bars and handrails in your home. Place contrasting color strips on the first and last steps.  Use mobility aids as needed, such as canes, walkers, scooters, and crutches.  Turn on lights if it is dark. Replace any light bulbs that burn out.  Set up furniture so that there are clear paths. Keep the furniture in the same spot.  Fix any uneven floor surfaces.  Choose a carpet design that does not hide the edge of steps of a stairway.  Be aware of any and all pets.  Review your medicines with your healthcare provider. Some medicines can cause dizziness or changes in blood pressure, which increase your risk of falling. Talk with  your health care provider about other ways that you can decrease your risk of falls. This may include working with a physical therapist or trainer to improve your strength, balance, and endurance. This information is not intended to replace advice given to you by your health care provider. Make sure you discuss any questions you have with your health care provider. Document Released: 08/25/2002 Document Revised: 02/01/2016 Document Reviewed: 10/09/2014 [employer] Interactive Patient Education  2018 [employer]  

## 2018-03-20 ENCOUNTER — Ambulatory Visit: Payer: Medicare Other

## 2018-03-20 VITALS — BP 169/68 | HR 61

## 2018-03-20 DIAGNOSIS — R2681 Unsteadiness on feet: Secondary | ICD-10-CM

## 2018-03-20 DIAGNOSIS — R2689 Other abnormalities of gait and mobility: Secondary | ICD-10-CM

## 2018-03-20 DIAGNOSIS — M6281 Muscle weakness (generalized): Secondary | ICD-10-CM

## 2018-03-20 NOTE — Therapy (Signed)
[facility] [address] [place], [place], [address] Phone: [phone]   Fax:  [phone]  Physical Therapy Treatment  Patient Details  Name: [name] MRN: [id number] Date of Birth: 1932/04/24 Referring Provider: Dr. Dema Severin   Encounter Date: 03/20/2018  PT End of Session - 03/20/18 0828    Visit Number  14    Number of Visits  17    Date for PT Re-Evaluation  03/26/18    Authorization Type  [employer] Medicare    PT Start Time  0806 pt late    PT Stop Time  0833    PT Time Calculation (min)  27 min    Equipment Utilized During Treatment  -- S prn    Activity Tolerance  Patient tolerated treatment well    Behavior During Therapy  [facility] for tasks assessed/performed       Past Medical History:  Diagnosis Date  . Anxiety   . Aortic stenosis   . Arthritis   . Asthma    uses inhaler as needed  . Depression   . Diabetes mellitus   . Emphysema of lung ([place])   . GERD (gastroesophageal reflux disease)   . Glaucoma   . Hyperlipidemia   . Hypertension   . [name]   . Murmur   . Nephrolithiasis   . Pneumonia 3/17 and 3/18    Past Surgical History:  Procedure Laterality Date  . ABDOMINAL HYSTERECTOMY    . ANKLE SURGERY  1998   d/t fx.  Left ankle  . ANTERIOR AND POSTERIOR REPAIR  06/21/2011   Procedure: ANTERIOR (CYSTOCELE) AND POSTERIOR REPAIR (RECTOCELE);  Surgeon: Maisie Fus;  Location: [facility];  Service: Gynecology;  Laterality: N/A;  . AORTIC VALVE REPLACEMENT N/A 07/17/2013   Procedure: AORTIC VALVE REPLACEMENT (AVR);  Surgeon: Ivin Poot, MD;  Location: [place];  Service: Open Heart Surgery;  Laterality: N/A;  . CARDIAC CATHETERIZATION    . CARDIAC VALVE REPLACEMENT    . CORONARY ANGIOPLASTY    . CORONARY ARTERY BYPASS GRAFT N/A 07/17/2013   Procedure: CORONARY ARTERY BYPASS GRAFT, TIMES ONE, ON PUMP, USING RIGHT INTERNAL MAMMARY ARTERY.;  Surgeon: Ivin Poot, MD;   Location: [facility];  Service: Open Heart Surgery;  Laterality: N/A;  . FACIAL COSMETIC SURGERY     face lift  . INTRAOPERATIVE TRANSESOPHAGEAL ECHOCARDIOGRAM N/A 07/17/2013   Procedure: INTRAOPERATIVE TRANSESOPHAGEAL ECHOCARDIOGRAM;  Surgeon: Ivin Poot, MD;  Location: [facility];  Service: Open Heart Surgery;  Laterality: N/A;  . LAPAROSCOPIC ASSISTED VAGINAL HYSTERECTOMY  06/21/2011   Procedure: LAPAROSCOPIC ASSISTED VAGINAL HYSTERECTOMY;  Surgeon: Maisie Fus;  Location: [facility];  Service: Gynecology;  Laterality: N/A;  . LEFT AND RIGHT HEART CATHETERIZATION WITH CORONARY ANGIOGRAM N/A 06/27/2013   Procedure: LEFT AND RIGHT HEART CATHETERIZATION WITH CORONARY ANGIOGRAM;  Surgeon: Ramond Dial, MD;  Location: [facility];  Service: Cardiovascular;  Laterality: N/A;  . NOSE SURGERY    . SALPINGOOPHORECTOMY  06/21/2011   Procedure: SALPINGO OOPHERECTOMY;  Surgeon: Maisie Fus;  Location: [facility];  Service: Gynecology;  Laterality: Bilateral;  . VAGINAL PROLAPSE REPAIR  06/21/2011   Procedure: SACROPEXY;  Surgeon: Maisie Fus;  Location: [facility];  Service: Gynecology;  Laterality: N/A;  sacrospinous ligament suspension    Vitals:   03/20/18 0810 03/20/18 0829  BP: (!) 161/69 (!) 169/68  Pulse: 62 61  SpO2: 94%     Subjective Assessment - 03/20/18 3299  Subjective  Pt denied falls or changes since last visit.     Pertinent History  2L of SpO2, osteoporosis, COPD, aortic valve replacement, HTN, HLD, DM, depression, diverticulosis, incontinence, glaucoma, 02/2011: 40-50% carotid stenosis, emphysema, CKD stage 3, CAD, hiatal hernia    Patient Stated Goals  She'd love to stop using SpO2 as it is heavy, she'd like to walk without an AD.    Currently in Pain?  No/denies                       [facility] Adult PT Treatment/Exercise - 03/20/18 0810      Ambulation/Gait   Ambulation/Gait  Yes    Ambulation/Gait Assistance  6: Modified independent (Device/Increase time)     Ambulation/Gait Assistance Details  SaO2 after amb.: 89%90%. Pt amb. at [place]. speed but safely. No LOB.    Ambulation Distance (Feet)  600 Feet    Assistive device  Rollator    Gait Pattern  Step-through pattern;Decreased stride length;Decreased trunk rotation    Ambulation Surface  Level;Unlevel;Indoor;Outdoor;Paved      Standardized Balance Assessment   Standardized Balance Assessment  Dynamic Gait Index      Dynamic Gait Index   Level Surface  Mild Impairment    Change in Gait Speed  Mild Impairment    Gait with Horizontal Head Turns  Normal    Gait with Vertical Head Turns  Normal    Gait and Pivot Turn  Normal    Step Over Obstacle  Mild Impairment    Step Around Obstacles  Normal    Steps  Mild Impairment    Total Score  20             PT Education - 03/20/18 0828    Education provided  Yes    Education Details  PT discussed goal progress, outcome measures, and d/c. Pt verbalized understanding of fall risk prevention.     Person(s) Educated  Patient    Methods  Explanation    Comprehension  Verbalized understanding       PT Short Term Goals - 02/25/18 0941      PT SHORT TERM GOAL #1   Title  Pt will be IND in HEP to improve balance, endurance, strength, and flexibility. TARGET DATE FOR ALL STGS: 02/22/18    Baseline  met 02-25-18    Status  Achieved      PT SHORT TERM GOAL #2   Title  Pt will improve DGI score to >/=16/24 to decr. falls risk.     Status  Achieved      PT SHORT TERM GOAL #3   Title  Pt will improve gait speed with LRAD, not holding SpO2 machine to >/=2.62 ft/sec. to safely amb. in the community.       PT SHORT TERM GOAL #4   Title  Pt will report zero falls over the last 2 weeks to improve safety.       PT SHORT TERM GOAL #6   Title  Pt will amb. 500' with LRAD, not carrying SpO2 machine, at MOD I level to improve safety during functional mobility.       PT SHORT TERM GOAL #7   Title  Pt will improve 6MWT distance with LRAD to 934' to  improve endurance.     Baseline  845' with rollator;  800' with rollator on 02-25-18     Period  Weeks    Status  Not Met  PT Long Term Goals - 03/20/18 0836      PT LONG TERM GOAL #1   Title  Pt will verbalize understanding fall prevention strategies to reduce falls risk. TARGET DATE FOR ALL LTGS: 03/22/18    Baseline  handout given 03-18-18 - will assess understanding at next scheduled visit on 03-20-18    Status  Achieved      PT LONG TERM GOAL #2   Title  Pt will improve DGI score to >/=20/24 to decr. falls risk.     Status  Achieved      PT LONG TERM GOAL #3   Title  Pt will amb. 600' over even/uneven terrain with LRAD, at MOD I level, including traversing curbs to improve safety during functional mobility.     Status  Achieved      PT LONG TERM GOAL #4   Title  Pt will amb. 100', at MOD I level (slower speed), over even terrain to amb. safely at home.     Baseline  met 03-18-18    Status  Achieved      PT LONG TERM GOAL #5   Title  Pt will improve walking distance during 6MWT to 1022' to improve endurance.     Baseline  810.8' with rollator on 03-18-18; 2 occurrences of toes catching floor but pt able to independently recover LOB    Status  Not Met            Plan - 03/20/18 0829    Clinical Impression Statement  Pt met LTGs 1, 3, and 4. Pt's systolic BP was elevated today, but no s/s of CVA or MI. PT educated pt to inform MD if BP remains elevated. Pt d/c today due to excellent progress in PT. Please see d/c summary for details.     Rehab Potential  Good    Clinical Impairments Affecting Rehab Potential  see above    PT Frequency  2x / week    PT Duration  8 weeks    PT Treatment/Interventions  ADLs/Self Care Home Management;Biofeedback;Therapeutic exercise;Manual techniques;Therapeutic activities;Functional mobility training;Stair training;Gait training;Patient/family education;Orthotic Fit/Training;DME Instruction;Neuromuscular re-education;Balance  training;Vestibular;Canalith Repostioning    PT Next Visit Plan  d/c    PT Home Exercise Plan  balance HEP given on 09-25-17    Consulted and Agree with Plan of Care  Patient       Patient will benefit from skilled therapeutic intervention in order to improve the following deficits and impairments:  Abnormal gait, Decreased endurance, Decreased knowledge of use of DME, Decreased balance, Decreased mobility, Impaired flexibility, Postural dysfunction, Decreased strength, Dizziness  Visit Diagnosis: Other abnormalities of gait and mobility  Unsteadiness on feet  Muscle weakness (generalized)     Problem List Patient Active Problem List   Diagnosis Date Noted  . Pulmonary nodule 12/03/2017  . Iron deficiency anemia   . Heme + stool   . CAD (coronary artery disease) 10/27/2017  . Microcytic anemia 10/27/2017  . Anemia due to GI blood loss 10/27/2017  . COPD with acute exacerbation ([place]) 10/27/2017  . Pulmonary nodule, left 10/15/2017  . Anemia, chronic disease 12/07/2016  . Chronic obstructive pulmonary disease ([place])   . Acute on chronic respiratory failure with hypoxia ([place]) 11/19/2016  . CKD (chronic kidney disease), stage III ([place]) 11/19/2016  . Diabetes mellitus type 2, uncontrolled ([place]) 11/19/2016  . Recurrent pneumonia 11/19/2016  . Chronic diastolic CHF (congestive heart failure) ([place]) 11/19/2016  . Pulmonary HTN ([place]) 11/19/2016  . HLD (hyperlipidemia) 11/19/2016  .  Hiatal hernia 05/01/2016  . History of bacterial pneumonia 05/01/2016  . COPD exacerbation ([place]) 11/18/2015  . Physical deconditioning 07/24/2013  . S/P AVR (aortic valve replacement) 07/23/2013  . S/P CABG x 1 07/23/2013  . Aortic stenosis 06/10/2013  . Essential hypertension 06/10/2013  . COPD GOLD II  10/26/2011    Meredeth Furber L 03/20/2018, 8:37 AM  [facility] [address] [place], [place], [address] Phone: [phone]   Fax:   [phone]  Name: [name] MRN: [id number] Date of Birth: [birth year]  PHYSICAL THERAPY DISCHARGE SUMMARY  Visits from Start of Care: 14  Current functional level related to goals / functional outcomes: PT Long Term Goals - 03/20/18 0836      PT LONG TERM GOAL #1   Title  Pt will verbalize understanding fall prevention strategies to reduce falls risk. TARGET DATE FOR ALL LTGS: 03/22/18    Baseline  handout given 03-18-18 - will assess understanding at next scheduled visit on 03-20-18    Status  Achieved      PT LONG TERM GOAL #2   Title  Pt will improve DGI score to >/=20/24 to decr. falls risk.     Status  Achieved      PT LONG TERM GOAL #3   Title  Pt will amb. 600' over even/uneven terrain with LRAD, at MOD I level, including traversing curbs to improve safety during functional mobility.     Status  Achieved      PT LONG TERM GOAL #4   Title  Pt will amb. 100', at MOD I level (slower speed), over even terrain to amb. safely at home.     Baseline  met 03-18-18    Status  Achieved      PT LONG TERM GOAL #5   Title  Pt will improve walking distance during 6MWT to 1022' to improve endurance.     Baseline  810.8' with rollator on 03-18-18; 2 occurrences of toes catching floor but pt able to independently recover LOB    Status  Not Met         Remaining deficits: Intermittent decr. SaO2 on 2L of O2 after activity.   Education / Equipment: HEP and rollator  Plan: Patient agrees to discharge.  Patient goals were met. Patient is being discharged due to meeting the stated rehab goals.  ?????          Geoffry Paradise, PT,DPT 03/20/18 8:37 AM Phone: [phone] Fax: [phone]

## 2018-03-22 ENCOUNTER — Ambulatory Visit: Payer: Medicare Other

## 2018-03-25 ENCOUNTER — Ambulatory Visit: Payer: Medicare Other | Admitting: Physical Therapy

## 2018-03-27 ENCOUNTER — Other Ambulatory Visit: Payer: Self-pay | Admitting: Internal Medicine

## 2018-03-28 ENCOUNTER — Ambulatory Visit: Payer: Medicare Other

## 2018-07-01 ENCOUNTER — Encounter: Payer: Self-pay | Admitting: Primary Care

## 2018-07-01 ENCOUNTER — Ambulatory Visit (INDEPENDENT_AMBULATORY_CARE_PROVIDER_SITE_OTHER)
Admission: RE | Admit: 2018-07-01 | Discharge: 2018-07-01 | Disposition: A | Discharge status: Home / Self Care | Payer: Medicare Other | Source: Ambulatory Visit | Attending: Primary Care | Admitting: Primary Care

## 2018-07-01 ENCOUNTER — Ambulatory Visit (INDEPENDENT_AMBULATORY_CARE_PROVIDER_SITE_OTHER): Payer: Medicare Other | Admitting: Primary Care

## 2018-07-01 VITALS — BP 132/60 | HR 50 | Temp 97.6°F | Ht 59.0 in | Wt 104.6 lb

## 2018-07-01 DIAGNOSIS — J441 Chronic obstructive pulmonary disease with (acute) exacerbation: Secondary | ICD-10-CM

## 2018-07-01 DIAGNOSIS — R911 Solitary pulmonary nodule: Secondary | ICD-10-CM

## 2018-07-01 MED ORDER — DOXYCYCLINE HYCLATE 100 MG PO TABS: 100.0000 mg | ORAL_TABLET | Freq: Two times a day (BID) | ORAL | 0 refills | Status: DC

## 2018-07-01 MED ORDER — PREDNISONE 10 MG PO TABS: ORAL_TABLET | ORAL | 0 refills | Status: DC

## 2018-07-01 NOTE — Assessment & Plan Note (Signed)
-   Hx Left lower lobe nodule/ +PET - S/p empiric radiation therapy ending in April 2019. ? radiation oncology wants additional treatment - Due for a follow-up CT scan. Has apt on Nov 21  - Will be following up with Dr. Chase Caller as well after

## 2018-07-01 NOTE — Progress Notes (Signed)
@Patient  ID: [name], female    DOB: Apr 11, 1932, 84 y.o.   MRN: [id number]  Chief Complaint  Patient presents with  . Acute Visit    cough with green mucus x3 days , SOB, wheezing    Referring provider: Harlan Stains, MD  HPI: 84 year old female, former smoker quit in 1974 (34 pack year hx). PMH COPD GOLD II, acute on chronic resp failure (2L O2), pneumonia, pulmonary HTN. Patient of Dr. Chase Caller, last seen 02/06/18.  07/01/2018 Patient presents today with acute complaints of coughing. Cough with green mucus x 3 days days with associated wheezing. This is her second exacerbation. Stopped Anoro. On flovent. Used proair a couple times today. Has neb at home. Starting taking mucinex and delsym cough syrup yesterday. Not having any trouble getting mucus up.   Hx Left lower lobe nodule s/p empiric radiation therapy ending in April 2019. Apparently radiation oncology wants to give her some more treatments now. She is due for a follow-up CT scan. Has apt on Nov 21 and will be following up with Dr. Chase Caller as well after.    Allergies  Allergen Reactions  . Lipitor [Atorvastatin] Other (See Comments)    myalgias myalgias  . Lisinopril Other (See Comments)    cough cough  . Amaryl [Glimepiride]     Side effects  . Avelox [Moxifloxacin Hcl In Nacl]     nausea  . Ciprofloxacin     nausea  . Other Other (See Comments)    Increased cough  . Prevacid [Lansoprazole]     diarrhea  . Spiriva Handihaler [Tiotropium Bromide Monohydrate]     Increased cough  . Symbicort [Budesonide-Formoterol Fumarate] Cough    Increased cough    Immunization History  Administered Date(s) Administered  . Influenza Split 07/19/2014  . Influenza, High Dose Seasonal PF 06/16/2016, 05/29/2017  . Influenza,inj,Quad PF,6+ Mos 06/19/2015  . Pneumococcal Conjugate-13 09/18/2013  . Pneumococcal Polysaccharide-23 05/14/2006  . Tdap 03/04/2013  . Zoster 08/20/2008, 08/18/2014    Past Medical  History:  Diagnosis Date  . Anxiety   . Aortic stenosis   . Arthritis   . Asthma    uses inhaler as needed  . Depression   . Diabetes mellitus   . Emphysema of lung ([place])   . GERD (gastroesophageal reflux disease)   . Glaucoma   . Hyperlipidemia   . Hypertension   . [name]   . Murmur   . Nephrolithiasis   . Pneumonia 3/17 and 3/18    Tobacco History: Social History   Tobacco Use  Smoking Status Former Smoker  . Packs/day: 2.00  . Years: 15.00  . Pack years: 30.00  . Types: Cigarettes  . Last attempt to quit: 09/18/1972  . Years since quitting: 45.8  Smokeless Tobacco Never Used   Counseling given: Not Answered   Outpatient Medications Prior to Visit  Medication Sig Dispense Refill  . acetaminophen (TYLENOL) 500 MG tablet Take 500 mg by mouth every 6 (six) hours as needed for headache.    . albuterol (PROVENTIL) (2.5 MG/3ML) 0.083% nebulizer solution Take 3 mLs (2.5 mg total) by nebulization every 6 (six) hours as needed for wheezing. 75 mL 0  . alendronate (FOSAMAX) 70 MG tablet Take 70 mg by mouth once a week. Take with a full glass of water on an empty stomach.    [name] amLODipine (NORVASC) 10 MG tablet Take 1 tablet (10 mg total) by mouth daily. 90 tablet 3  . Jearl Klinefelter  ELLIPTA 62.5-25 MCG/INH AEPB USE 1 INHALATION BY MOUTH  DAILY 180 each 0  . aspirin EC 81 MG tablet Take 81 mg by mouth at bedtime.    [name] buPROPion (WELLBUTRIN XL) 150 MG 24 hr tablet Take 150 mg by mouth daily.  2  . cholecalciferol (VITAMIN D) 1000 units tablet Take 1,000 Units by mouth at bedtime.     . cloNIDine (CATAPRES) 0.1 MG tablet Take 0.1 mg by mouth 2 (two) times daily.  5  . Docusate Sodium (COLACE PO) Take by mouth 2 (two) times daily.    . Ferrous Sulfate (IRON) 325 (65 Fe) MG TABS Take by mouth 2 (two) times daily.    . fluticasone (FLOVENT HFA) 220 MCG/ACT inhaler Inhale 2 puffs into the lungs 2 (two) times daily.    . furosemide (LASIX) 40 MG tablet Take 0.5 tablets  (20 mg total) by mouth every other day.    . irbesartan (AVAPRO) 150 MG tablet Take 1 tablet (150 mg total) by mouth daily. 90 tablet 3  . Magnesium 250 MG TABS Take 250 mg by mouth at bedtime.     . Multiple Vitamins-Minerals (OCUVITE EYE HEALTH FORMULA PO) Take by mouth.    . Multiple Vitamins-Minerals (OCUVITE-LUTEIN PO) Take 1 tablet by mouth every morning.     . pravastatin (PRAVACHOL) 20 MG tablet Take 20 mg by mouth daily.    [name] PROAIR HFA 108 (90 BASE) MCG/ACT inhaler Inhale 2 puffs into the lungs 4 (four) times daily as needed for wheezing or shortness of breath.     . sertraline (ZOLOFT) 100 MG tablet Take 1 tablet (100 mg total) by mouth every morning. 30 tablet 0   No facility-administered medications prior to visit.     Review of Systems  Review of Systems  Constitutional: Negative.   HENT: Negative.   Respiratory: Positive for cough and wheezing. Negative for shortness of breath.   Cardiovascular: Negative.     Physical Exam  BP 132/60 (BP Location: Left Arm, Cuff Size: Normal)   Pulse (!) 50   Temp 97.6 F (36.4 C)   Ht 4\' 11"  (1.499 m)   Wt 104 lb 9.6 oz (47.4 kg)   SpO2 95%   BMI 21.13 kg/m  Physical Exam  Constitutional: She is oriented to person, place, and time. She appears well-developed and well-nourished.  Thin elderly female, no acute distress  HENT:  Head: Normocephalic and atraumatic.  Eyes: Pupils are equal, round, and reactive to light. EOM are normal.  Neck: Normal range of motion. Neck supple.  Cardiovascular: Regular rhythm.  Pulmonary/Chest: Effort normal. No respiratory distress. She has no wheezes.  Ls with crackles left mid-lower lobe. On oxygen  Musculoskeletal: Normal range of motion.  Neurological: She is alert and oriented to person, place, and time.  Skin: Skin is warm and dry.  Psychiatric: She has a normal mood and affect. Her behavior is normal. Judgment and thought content normal.     Lab Results:  CBC    Component Value  Date/Time   WBC 9.5 10/29/2017 0552   RBC 3.46 (L) 10/29/2017 0552   HGB 7.8 (L) 10/29/2017 0552   HCT 26.5 (L) 10/29/2017 0552   PLT 282 10/29/2017 0552   MCV 76.6 (L) 10/29/2017 0552   MCH 22.5 (L) 10/29/2017 0552   MCHC 29.4 (L) 10/29/2017 0552   RDW 16.6 (H) 10/29/2017 0552   LYMPHSABS 0.6 (L) 11/18/2015 1648   MONOABS 0.9 11/18/2015 1648   EOSABS 0.0 11/18/2015 1648  BASOSABS 0.0 11/18/2015 1648    BMET    Component Value Date/Time   NA 139 10/29/2017 0552   K 3.9 10/29/2017 0552   CL 105 10/29/2017 0552   CO2 22 10/29/2017 0552   GLUCOSE 111 (H) 10/29/2017 0552   BUN 40 (H) 10/29/2017 0552   CREATININE 1.38 (H) 10/29/2017 0552   CREATININE 1.18 (H) 04/30/2014 0843   CALCIUM 9.4 10/29/2017 0552   GFRNONAA 34 (L) 10/29/2017 0552   GFRNONAA 44 (L) 04/30/2014 0843   GFRAA 40 (L) 10/29/2017 0552   GFRAA 51 (L) 04/30/2014 0843    BNP    Component Value Date/Time   BNP 175.9 (H) 10/27/2017 0302    ProBNP    Component Value Date/Time   PROBNP 407.0 (H) 09/10/2013 1105    Imaging: Dg Chest 2 View  Result Date: 07/01/2018 CLINICAL DATA:  84 year old female with a history cough and congestion EXAM: CHEST - 2 VIEW COMPARISON:  CT 02/12/2018, plain film 11/14/2017, 10/27/2017 FINDINGS: Cardiomediastinal silhouette unchanged in size and contour. Surgical changes of median sternotomy and aortic valve replacement. Stigmata of emphysema, with increased retrosternal airspace, flattened hemidiaphragms, increased AP diameter, and hyperinflation on the AP view. Increased reticulonodular opacities at the lung bases compared to the prior plain film. Nodular opacity at the periphery of the left lung base in this patient with known carcinoma. Unchanged configuration of thoracic vertebral bodies. IMPRESSION: Increased reticulonodular opacities at the bilateral lung bases, potentially related to radiation therapy, however, multifocal infection cannot be excluded. Advanced emphysema.  Surgical changes of median sternotomy and aortic valve replacement. Electronically Signed   By: Corrie Mckusick D.O.   On: 07/01/2018 13:49     Assessment & Plan:   COPD with acute exacerbation ([place]) - Symptoms consistent with COPD exacerbation - CXR showed increased reticulonodular opacities at the bilateral lung bases, potentially related to radiation therapy, however, multifocal infection cannot be excluded. Advanced emphysema - Tx Doxycyline BID x7 days  And Prednisone 20mg  x 5 days  - Continue Flovent 2 puffs twice daily - Enc albuterol nebulizer every 6 hours as needed for wheezing - Mucinex twice daily and Delsym cough syrup twice daily - Fu as needed if symptoms do not improve or worsen     Pulmonary nodule - Hx Left lower lobe nodule/ +PET - S/p empiric radiation therapy ending in April 2019. ? radiation oncology wants additional treatment - Due for a follow-up CT scan. Has apt on Nov 21  - Will be following up with Dr. Chase Caller as well after    Martyn Ehrich, NP 07/01/2018

## 2018-07-01 NOTE — Patient Instructions (Addendum)
Seen today for COPD exacerbation  We will check a chest x-ray to make sure he did not have pneumonia  Prednisone 20 mg x 5 days, will wait to for CXR results to send in ABX  Continue Flovent 2 puffs twice daily Please use albuterol nebulizer every 6 hours as needed for wheezing Continue Mucinex twice daily and Delsym cough syrup twice daily  Follow-up as needed if symptoms do not improve or worsen  Next appoint with Dr. Chase Caller scheduled for November 25

## 2018-07-01 NOTE — Assessment & Plan Note (Addendum)
-   Symptoms consistent with COPD exacerbation - CXR showed increased reticulonodular opacities at the bilateral lung bases, potentially related to radiation therapy, however, multifocal infection cannot be excluded. Advanced emphysema - Tx Doxycyline BID x7 days  And Prednisone 20mg  x 5 days  - Continue Flovent 2 puffs twice daily - Enc albuterol nebulizer every 6 hours as needed for wheezing - Mucinex twice daily and Delsym cough syrup twice daily - Fu as needed if symptoms do not improve or worsen

## 2018-07-08 ENCOUNTER — Telehealth: Payer: Self-pay | Admitting: Primary Care

## 2018-07-08 MED ORDER — DOXYCYCLINE HYCLATE 100 MG PO TABS: ORAL_TABLET | ORAL | 0 refills | Status: DC

## 2018-07-08 MED ORDER — PREDNISONE 10 MG PO TABS: ORAL_TABLET | ORAL | 0 refills | Status: DC

## 2018-07-08 NOTE — Telephone Encounter (Signed)
Called and spoke with pt letting her know that Eustaquio Maize said she could do an additional 3 days of the doxy as well as pred 20mg . Stated to pt that Eustaquio Maize has called these Rx in for her.  Pt expressed understanding. Nothing further needed.

## 2018-07-08 NOTE — Telephone Encounter (Signed)
Additional 3 days of doxycyline sent to pharmacy and prednisone 20mg  daily x5 days

## 2018-07-08 NOTE — Telephone Encounter (Signed)
Called and spoke with pt who states she feels better after her OV with Derl Barrow 10/14 but states she feels like she might need another round of abx.  Pt states she is still coughing up some green mucus and states she is still a little winded with her breathing and states she is wheezing some.  Beth, please advise on this for pt. Thanks!

## 2018-08-08 ENCOUNTER — Ambulatory Visit
Admission: RE | Admit: 2018-08-08 | Discharge: 2018-08-08 | Disposition: A | Discharge status: Home / Self Care | Payer: Medicare Other | Source: Ambulatory Visit | Attending: Radiation Oncology | Admitting: Radiation Oncology

## 2018-08-08 ENCOUNTER — Other Ambulatory Visit: Payer: Self-pay

## 2018-08-08 ENCOUNTER — Encounter: Payer: Self-pay | Admitting: Radiation Oncology

## 2018-08-08 VITALS — BP 178/71 | HR 66 | Temp 97.8°F | Resp 24 | Ht 59.0 in | Wt 101.4 lb

## 2018-08-08 DIAGNOSIS — H9319 Tinnitus, unspecified ear: Secondary | ICD-10-CM | POA: Insufficient documentation

## 2018-08-08 DIAGNOSIS — Z9981 Dependence on supplemental oxygen: Secondary | ICD-10-CM | POA: Diagnosis not present

## 2018-08-08 DIAGNOSIS — Z923 Personal history of irradiation: Secondary | ICD-10-CM | POA: Insufficient documentation

## 2018-08-08 DIAGNOSIS — Z79899 Other long term (current) drug therapy: Secondary | ICD-10-CM | POA: Diagnosis not present

## 2018-08-08 DIAGNOSIS — R0781 Pleurodynia: Secondary | ICD-10-CM | POA: Diagnosis not present

## 2018-08-08 DIAGNOSIS — R51 Headache: Secondary | ICD-10-CM | POA: Insufficient documentation

## 2018-08-08 DIAGNOSIS — R0602 Shortness of breath: Secondary | ICD-10-CM | POA: Insufficient documentation

## 2018-08-08 DIAGNOSIS — R05 Cough: Secondary | ICD-10-CM | POA: Diagnosis not present

## 2018-08-08 DIAGNOSIS — Z9181 History of falling: Secondary | ICD-10-CM | POA: Diagnosis not present

## 2018-08-08 DIAGNOSIS — R911 Solitary pulmonary nodule: Secondary | ICD-10-CM | POA: Insufficient documentation

## 2018-08-08 DIAGNOSIS — Z7982 Long term (current) use of aspirin: Secondary | ICD-10-CM | POA: Diagnosis not present

## 2018-08-08 NOTE — Progress Notes (Signed)
Pt presents today for f/u with Dr. Sondra Come. Pt is on supplemental oxygen. Pt reports a fall a few weeks ago and has had pain in side of chest, which is relieved by heat. Pt reports ringing in ears that has been occurring since fall. Pt reports OTC drops for ear are "working". Pt reports occasional difficulty swallowing. Pt reports SOB and pt attributes this to colder weather. Pt reports occasional cough with clear sputum. Pt denies hemoptysis.   BP (!) 178/71 (BP Location: Left Arm, Patient Position: Sitting)   Pulse 66   Temp 97.8 F (36.6 C) (Oral)   Resp (!) 24   Ht 4\' 11"  (1.499 m)   Wt 101 lb 6.4 oz (46 kg)   BMI 20.48 kg/m  Wt Readings from Last 3 Encounters:  08/08/18 101 lb 6.4 oz (46 kg)  07/01/18 104 lb 9.6 oz (47.4 kg)  02/06/18 102 lb (46.3 kg)   Loma Sousa, RN BSN

## 2018-08-08 NOTE — Progress Notes (Signed)
Radiation Oncology         [phone] ________________________________  Name: [name] MRN: [id number]  Date: 08/08/2018  DOB: March 16, 1932  Follow-Up Visit Note  CC: Harlan Stains, MD  Brand Males, MD    ICD-10-CM   1. Pulmonary nodule, left R91.1   2. Pulmonary nodule R91.1 CT Chest Wo Contrast    Diagnosis:   Presumptive clinical stage 1 non-small cell lung cancerpresentingin the left lower lung  Interval Since Last Radiation:  7 months  Radiation treatment dates: 12/21/17-12/31/17 Site/dose: Left lung / 50 Gy in 5 fractions (SBRT)  Narrative:  The patient returns today for routine follow-up.  Following completion of radiotherapy, she underwent a CT scan of the chest on 02/12/18 which showed minimally decreased size of the pulmonary nodule in the lateral left lower lobe. No new or progressive metastatic disease within the thorax.                    On review of systems, the patient reports a fall a few weeks ago and has had pain in the area of her posterior right ribs, which is relieved by heat. She reports occasional headaches. She reports tinnitus over the past couple months and states that OTC drops for ears are working. She reports occasional difficulty with swallowing pills. She reports shortness of breath and attributes this to colder weather. She reports occasional cough with clear sputum. She denies hemoptysis. She is on continuous supplemental oxygen (2L).   ALLERGIES:  is allergic to lipitor [atorvastatin]; lisinopril; amaryl [glimepiride]; avelox [moxifloxacin hcl in nacl]; ciprofloxacin; other; prevacid [lansoprazole]; spiriva handihaler [tiotropium bromide monohydrate]; and symbicort [budesonide-formoterol fumarate].  Meds: Current Outpatient Medications  Medication Sig Dispense Refill  . acetaminophen (TYLENOL) 500 MG tablet Take 500 mg by mouth every 6 (six) hours as needed for headache.    . albuterol (PROVENTIL) (2.5 MG/3ML) 0.083% nebulizer  solution Take 3 mLs (2.5 mg total) by nebulization every 6 (six) hours as needed for wheezing. 75 mL 0  . alendronate (FOSAMAX) 70 MG tablet Take 70 mg by mouth once a week. Take with a full glass of water on an empty stomach.    [name] amLODipine (NORVASC) 10 MG tablet Take 1 tablet (10 mg total) by mouth daily. 90 tablet 3  . aspirin EC 81 MG tablet Take 81 mg by mouth at bedtime.    [name] buPROPion (WELLBUTRIN XL) 150 MG 24 hr tablet Take 150 mg by mouth daily.  2  . cholecalciferol (VITAMIN D) 1000 units tablet Take 1,000 Units by mouth at bedtime.     . cloNIDine (CATAPRES) 0.1 MG tablet Take 0.1 mg by mouth 2 (two) times daily.  5  . Docusate Sodium (COLACE PO) Take by mouth 2 (two) times daily.    . Ferrous Sulfate (IRON) 325 (65 Fe) MG TABS Take by mouth 2 (two) times daily.    . fluticasone (FLOVENT HFA) 220 MCG/ACT inhaler Inhale 2 puffs into the lungs 2 (two) times daily.    . furosemide (LASIX) 40 MG tablet Take 0.5 tablets (20 mg total) by mouth every other day.    . irbesartan (AVAPRO) 150 MG tablet Take 1 tablet (150 mg total) by mouth daily. 90 tablet 3  . Multiple Vitamins-Minerals (OCUVITE EYE HEALTH FORMULA PO) Take by mouth.    . Multiple Vitamins-Minerals (OCUVITE-LUTEIN PO) Take 1 tablet by mouth every morning.     . pravastatin (PRAVACHOL) 20 MG tablet Take 20 mg by mouth daily.    [name]  PROAIR HFA 108 (90 BASE) MCG/ACT inhaler Inhale 2 puffs into the lungs 4 (four) times daily as needed for wheezing or shortness of breath.     . sertraline (ZOLOFT) 100 MG tablet Take 1 tablet (100 mg total) by mouth every morning. 30 tablet 0  . ANORO ELLIPTA 62.5-25 MCG/INH AEPB USE 1 INHALATION BY MOUTH  DAILY (Patient not taking: Reported on 08/08/2018) 180 each 0  . doxycycline (VIBRA-TABS) 100 MG tablet 1 tab BID x3 additional days (Patient not taking: Reported on 08/08/2018) 6 tablet 0  . Magnesium 250 MG TABS Take 250 mg by mouth at bedtime.     . predniSONE (DELTASONE) 10 MG tablet Take 2 tabs  x 5 days (Patient not taking: Reported on 08/08/2018) 10 tablet 0   No current facility-administered medications for this encounter.     Physical Findings: The patient is in no acute distress. Patient is alert and oriented.  height is 4\' 11"  (1.499 m) and weight is 101 lb 6.4 oz (46 kg). Her oral temperature is 97.8 F (36.6 C). Her blood pressure is 178/71 (abnormal) and her pulse is 66. Her respiration is 24 (abnormal).   Lungs are clear to auscultation bilaterally. Heart has regular rate and rhythm. No palpable cervical, supraclavicular, or axillary adenopathy. Abdomen soft, non-tender, normal bowel sounds. She is on 2L of oxygen 24 hours a day.  Lab Findings: Lab Results  Component Value Date   WBC 9.5 10/29/2017   HGB 7.8 (L) 10/29/2017   HCT 26.5 (L) 10/29/2017   MCV 76.6 (L) 10/29/2017   PLT 282 10/29/2017    Radiographic Findings: No results found.  Impression:  Clinically stable.  Plan:  Patient will follow up with Dr. Chase Caller on Monday. I will put in an order for chest CT in the next couple weeks. Follow up in radiation oncology in 6 months.  ____________________________________  Blair Promise, PhD, MD  This document serves as a record of services personally performed by Gery Pray, MD. It was created on his behalf by Rae Lips, a trained medical scribe. The creation of this record is based on the scribe's personal observations and the provider's statements to them. This document has been checked and approved by the attending provider.

## 2018-08-09 ENCOUNTER — Telehealth: Payer: Self-pay | Admitting: *Deleted

## 2018-08-09 NOTE — Telephone Encounter (Signed)
CALLED PATIENT TO INFORM OF FU APPT. WITH DR. Covelo ON 02-06-19 @ 4 PM, SPOKE WITH PATIENT AND SHE IS AWARE OF THIS APPT.

## 2018-08-09 NOTE — Telephone Encounter (Signed)
XXXX 

## 2018-08-12 ENCOUNTER — Ambulatory Visit (INDEPENDENT_AMBULATORY_CARE_PROVIDER_SITE_OTHER): Payer: Medicare Other | Admitting: Internal Medicine

## 2018-08-12 ENCOUNTER — Encounter: Payer: Self-pay | Admitting: Internal Medicine

## 2018-08-12 VITALS — BP 128/62 | HR 46 | Ht 59.0 in | Wt 99.6 lb

## 2018-08-12 DIAGNOSIS — J449 Chronic obstructive pulmonary disease, unspecified: Secondary | ICD-10-CM | POA: Diagnosis not present

## 2018-08-12 DIAGNOSIS — Z23 Encounter for immunization: Secondary | ICD-10-CM

## 2018-08-12 DIAGNOSIS — J9611 Chronic respiratory failure with hypoxia: Secondary | ICD-10-CM | POA: Diagnosis not present

## 2018-08-12 MED ORDER — TIOTROPIUM BROMIDE-OLODATEROL 2.5-2.5 MCG/ACT IN AERS: 2.0000 | INHALATION_SPRAY | Freq: Every day | RESPIRATORY_TRACT | 1 refills | Status: DC

## 2018-08-12 NOTE — Addendum Note (Signed)
Addended by: Karmen Stabs on: 08/12/2018 10:12 AM   Modules accepted: Orders

## 2018-08-12 NOTE — Patient Instructions (Addendum)
ICD-10-CM   1. Chronic respiratory failure with hypoxia (HCC) J96.11   2. COPD GOLD II  J44.9     Stable COPD  Plan -We will add Anoro to the allergy list although I find it hard pressed to suspect that Anoro caused your high blood pressure and high blood sugar -Try to 4 weeks sample of Stiolto daily -Continue Flovent daily -Continue daily oxygen -Glad you are up-to-date with her vaccines -although you will benefit from a repeat booster shot of Pneumovax 23 today  Follow-up -3 months or sooner if needed; CAT score at follow-up

## 2018-08-12 NOTE — Progress Notes (Signed)
OV 05/29/2017  Chief Complaint  Patient presents with  . Follow-up    COPD Gold II. Patient states that she is feeling very weak this morning.    Chronic hypoxemic respiratory failure on oxygen 2 L associated with Gold stage II COPD and large hiatal hernia. She tells me the summer 2018 she had syncopal episodes and then after she started wearing her oxygen on a continuous basis this has not recurred. She is now on [employer] and [employer]. Also in spring 2018 and in 2017 she's had recurrent pneumonias. Most recent chest x-ray July 2018 seem to s show resolution but with some chronic lung changes. Currently she is feeling well. She will have high dose flu shot today    OV 08/16/2017  Chief Complaint  Patient presents with  . Follow-up    Pt fell twice within to days dizzy outdoors and hit her back of the head twice in Oct 2018. Pt has pain on the left side affecting her breathing from the fall. Pt states not breathing well in last three weeks.   84 year old female with chronic COPD and chronic hypoxemic respiratory failure on 2 L oxygen.  She has abnormal chest x-ray on account of pneumonia in 2017 CT scan of the chest and a chest x-ray 1 in 2018 was abnormal.  Overall she feels a COPD stable with a COPD Score of 15 as documented below.  Most recently since her last visit in the last few to several weeks she has been having falls and imbalance issues.  She feels these falls are not related to oxygen but related to balance.  This resulted in scalp laceration and bruises in her hands.  She is now using a cane to walk.  She is up-to-date with her flu shot.  She wants her next follow-up in March 2019 because of fear of pneumonia she does want to have a good evaluation of the lungs with CT scan of the chest.    OV 02/06/2018  Chief Complaint  Patient presents with  . Follow-up    PFT done today.  Pt had a cough x4-5 weeks after saw SG 3/18 but states cough finally went away. Pt still becomes SOB  going up and down steps. DME: [employer], 2L pulse.   84 year old female with =-  Gold stage II COPD with severe reduction in diffusion capacity and chronic hypoxemic respiratory failure on 2 L oxygen -Setting of large hiatal hernia -Setting of hyper already left lower lobe nodule status post radiation therapy ending in April 2019 -Setting of frequent mechanical falls last April 2019 -Setting of low body mass index  [name] presents for follow-up.  Overall she is doing stable with her COPD.  She is on oxygen and Flovent.  She says she cannot take other inhalers because of cough.  When I asked her more questions she could not remember the inhaler.  Medicine review shows cough happened after Spiriva and Symbicort.  She suspected it was just a transient cough after she took the inhaler.  She does not recollect sustained increases and cough for the whole day.  There is no fever or chills nausea vomiting or hemoptysis.  And in the last 2 months he has not fallen.  In terms of her cancer chemotherapy therapy there is no chemotherapy.  She did just get empiric radiation 5 treatments ending April but apparently radiation oncology wants to give her some more treatments now.  She is due for a follow-up CT scan.  Overall she is willing to try another inhaler to improve her symptoms because she had significant bronchodilator response on a PFT today.    07/01/2018 Patient presents today with acute complaints of coughing. Cough with green mucus x 3 days days with associated wheezing. This is her second exacerbation. Stopped Anoro. On flovent. Used proair a couple times today. Has neb at home. Starting taking mucinex and delsym cough syrup yesterday. Not having any trouble getting mucus up.   Hx Left lower lobe nodule s/p empiric radiation therapy ending in April 2019. Apparently radiation oncology wants to give her some more treatments now. She is due for a follow-up CT scan. Has apt on Nov 21 and will be  following up with Dr. Chase Caller as well after.    OV 08/12/18  Chief Complaint  Patient presents with  . Follow-up    SOB during cold weather   Results for [name] (MRN [id number]) as of 02/06/2018 10:38 Results for [name] (MRN [id number]) as of 02/06/2018 10:38  Ref. Range 11/07/2017 13:50  FEV1-Post Latest Units: L 0.93  FEV1-%Pred-Post Latest Units: % 68  FEV1-%Change-Post Latest Units: % 12   Results for [name] (MRN [id number]) as of 02/06/2018 10:38  Ref. Range 11/07/2017 13:50  DLCO unc Latest Units: ml/min/mmHg 5.13  DLCO unc % pred Latest Units: % 29    OV 08/12/2018  Subjective:  Patient ID: [name], female , DOB: May 06, 1932 , age 84 y.o. , MRN: [id number] , ADDRESS: [address] [id number]   08/12/2018 -   Chief Complaint  Patient presents with  . Follow-up    SOB during cold weather     HPI [name] 84 y.o. -returns for follow-up.  She last saw me in May 2019.  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  She continues with oxygen and Flovent.  Last visit in May 2019 I added an order for better symptom control but she tells me this caused hypertension and high blood sugar.  She is now on clonidine which is helping the blood pressure.  She feels very convinced that this is because of an aura when she is not taking it anymore.  She says overall her COPD is stable.  There are no new issues.  In fact COPD CAT score is 15 and unchanged.  Review of immunization records show her last Pneumovax was over 10 years ago.  She is willing to try Stiolto for better symptom control.    CAT COPD Symptom & Quality of Life Score (GSK trademark) 0 is no burden. 5 is highest burden 08/16/2017  08/12/2018   Never Cough -> Cough all the time 2 2  No phlegm in chest -> Chest is full of phlegm 2 3  No chest tightness -> Chest feels very tight 0 1  No dyspnea for 1 flight stairs/hill -> Very dyspneic for 1 flight of  stairs 4 2  No limitations for ADL at home -> Very limited with ADL at home 3 2  Confident leaving home -> Not at all confident leaving home 1 1  Sleep soundly -> Do not sleep soundly because of lung condition 1 2  Lots of Energy -> No energy at all 3 2  TOTAL Score (max 40)  15 15     ROS - per HPI     [name], Aortic stenosis, Arthritis, Asthma, Depression, Diabetes mellitus, Emphysema of lung (  [place]), GERD (gastroesophageal reflux disease), Glaucoma, Hyperlipidemia, Hypertension, [name], Murmur, Nephrolithiasis, and Pneumonia (3/17 and 3/18).   reports that she quit smoking about 45 years ago. Her smoking use included cigarettes. She has a 30.00 pack-year smoking history. She has never used smokeless tobacco.  Past Surgical History:  Procedure Laterality Date  . ABDOMINAL HYSTERECTOMY    . ANKLE SURGERY  1998   d/t fx.  Left ankle  . ANTERIOR AND POSTERIOR REPAIR  06/21/2011   Procedure: ANTERIOR (CYSTOCELE) AND POSTERIOR REPAIR (RECTOCELE);  Surgeon: Maisie Fus;  Location: [facility];  Service: Gynecology;  Laterality: N/A;  . AORTIC VALVE REPLACEMENT N/A 07/17/2013   Procedure: AORTIC VALVE REPLACEMENT (AVR);  Surgeon: Ivin Poot, MD;  Location: [facility];  Service: Open Heart Surgery;  Laterality: N/A;  . CARDIAC CATHETERIZATION    . CARDIAC VALVE REPLACEMENT    . CORONARY ANGIOPLASTY    . CORONARY ARTERY BYPASS GRAFT N/A 07/17/2013   Procedure: CORONARY ARTERY BYPASS GRAFT, TIMES ONE, ON PUMP, USING RIGHT INTERNAL MAMMARY ARTERY.;  Surgeon: Ivin Poot, MD;  Location: [facility];  Service: Open Heart Surgery;  Laterality: N/A;  . FACIAL COSMETIC SURGERY     face lift  . INTRAOPERATIVE TRANSESOPHAGEAL ECHOCARDIOGRAM N/A 07/17/2013   Procedure: INTRAOPERATIVE TRANSESOPHAGEAL ECHOCARDIOGRAM;  Surgeon: Ivin Poot, MD;  Location: [facility];  Service: Open Heart Surgery;  Laterality: N/A;  . LAPAROSCOPIC ASSISTED VAGINAL  HYSTERECTOMY  06/21/2011   Procedure: LAPAROSCOPIC ASSISTED VAGINAL HYSTERECTOMY;  Surgeon: Maisie Fus;  Location: [facility];  Service: Gynecology;  Laterality: N/A;  . LEFT AND RIGHT HEART CATHETERIZATION WITH CORONARY ANGIOGRAM N/A 06/27/2013   Procedure: LEFT AND RIGHT HEART CATHETERIZATION WITH CORONARY ANGIOGRAM;  Surgeon: Ramond Dial, MD;  Location: [facility];  Service: Cardiovascular;  Laterality: N/A;  . NOSE SURGERY    . SALPINGOOPHORECTOMY  06/21/2011   Procedure: SALPINGO OOPHERECTOMY;  Surgeon: Maisie Fus;  Location: [facility];  Service: Gynecology;  Laterality: Bilateral;  . VAGINAL PROLAPSE REPAIR  06/21/2011   Procedure: SACROPEXY;  Surgeon: Maisie Fus;  Location: [facility];  Service: Gynecology;  Laterality: N/A;  sacrospinous ligament suspension    Allergies  Allergen Reactions  . Lipitor [Atorvastatin] Other (See Comments)    myalgias myalgias  . Lisinopril Other (See Comments)    cough cough  . Amaryl [Glimepiride]     Side effects  . Avelox [Moxifloxacin Hcl In Nacl]     nausea  . Ciprofloxacin     nausea  . Other Other (See Comments)    Increased cough  . Prevacid [Lansoprazole]     diarrhea  . Spiriva Handihaler [Tiotropium Bromide Monohydrate]     Increased cough  . Symbicort [Budesonide-Formoterol Fumarate] Cough    Increased cough    Immunization History  Administered Date(s) Administered  . Influenza Split 07/19/2014  . Influenza, High Dose Seasonal PF 06/16/2016, 05/29/2017  . Influenza,inj,Quad PF,6+ Mos 06/19/2015  . Pneumococcal Conjugate-13 09/18/2013  . Pneumococcal Polysaccharide-23 05/14/2006  . Tdap 03/04/2013  . Zoster 08/20/2008, 08/18/2014    Family History  Problem Relation Age of Onset  . Emphysema Father   . Colon cancer Neg Hx   . Esophageal cancer Neg Hx   . Rectal cancer Neg Hx   . Stomach cancer Neg Hx      Current Outpatient Medications:  .  acetaminophen (TYLENOL) 500 MG tablet, Take 500 mg by mouth every 6  (six) hours as needed for headache.,  Disp: , Rfl:  .  albuterol (PROVENTIL) (2.5 MG/3ML) 0.083% nebulizer solution, Take 3 mLs (2.5 mg total) by nebulization every 6 (six) hours as needed for wheezing., Disp: 75 mL, Rfl: 0 .  alendronate (FOSAMAX) 70 MG tablet, Take 70 mg by mouth once a week. Take with a full glass of water on an empty stomach., Disp: , Rfl:  .  amLODipine (NORVASC) 10 MG tablet, Take 1 tablet (10 mg total) by mouth daily., Disp: 90 tablet, Rfl: 3 .  aspirin EC 81 MG tablet, Take 81 mg by mouth at bedtime., Disp: , Rfl:  .  buPROPion (WELLBUTRIN XL) 150 MG 24 hr tablet, Take 150 mg by mouth daily., Disp: , Rfl: 2 .  cholecalciferol (VITAMIN D) 1000 units tablet, Take 1,000 Units by mouth at bedtime. , Disp: , Rfl:  .  cloNIDine (CATAPRES) 0.1 MG tablet, Take 0.1 mg by mouth 2 (two) times daily., Disp: , Rfl: 5 .  Docusate Sodium (COLACE PO), Take by mouth 2 (two) times daily., Disp: , Rfl:  .  Ferrous Sulfate (IRON) 325 (65 Fe) MG TABS, Take by mouth 2 (two) times daily., Disp: , Rfl:  .  fluticasone (FLOVENT HFA) 220 MCG/ACT inhaler, Inhale 2 puffs into the lungs 2 (two) times daily., Disp: , Rfl:  .  furosemide (LASIX) 40 MG tablet, Take 0.5 tablets (20 mg total) by mouth every other day., Disp: , Rfl:  .  irbesartan (AVAPRO) 150 MG tablet, Take 1 tablet (150 mg total) by mouth daily., Disp: 90 tablet, Rfl: 3 .  Magnesium 250 MG TABS, Take 250 mg by mouth at bedtime. , Disp: , Rfl:  .  Multiple Vitamins-Minerals (OCUVITE EYE HEALTH FORMULA PO), Take by mouth., Disp: , Rfl:  .  Multiple Vitamins-Minerals (OCUVITE-LUTEIN PO), Take 1 tablet by mouth every morning. , Disp: , Rfl:  .  pravastatin (PRAVACHOL) 20 MG tablet, Take 20 mg by mouth daily., Disp: , Rfl:  .  PROAIR HFA 108 (90 BASE) MCG/ACT inhaler, Inhale 2 puffs into the lungs 4 (four) times daily as needed for wheezing or shortness of breath. , Disp: , Rfl:  .  sertraline (ZOLOFT) 100 MG tablet, Take 1 tablet (100 mg  total) by mouth every morning., Disp: 30 tablet, Rfl: 0 .  ANORO ELLIPTA 62.5-25 MCG/INH AEPB, USE 1 INHALATION BY MOUTH  DAILY (Patient not taking: Reported on 08/08/2018), Disp: 180 each, Rfl: 0 .  predniSONE (DELTASONE) 10 MG tablet, Take 2 tabs x 5 days (Patient not taking: Reported on 08/08/2018), Disp: 10 tablet, Rfl: 0      Objective:   Vitals:   08/12/18 0905  BP: 128/62  Pulse: (!) 46  SpO2: 97%  Weight: 99 lb 9.6 oz (45.2 kg)  Height: 4\' 11"  (1.499 m)    Estimated body mass index is 20.12 kg/m as calculated from the following:   Height as of this encounter: 4\' 11"  (1.499 m).   Weight as of this encounter: 99 lb 9.6 oz (45.2 kg).  @WEIGHTCHANGE @  [employer]   08/12/18 0905  Weight: 99 lb 9.6 oz (45.2 kg)     Physical Exam  General Appearance:    Alert, cooperative, no distress, appears stated age - yes , Deconditioned looking - no , OBESE  - no, Sitting on Wheelchair -  no  Head:    Normocephalic, without obvious abnormality, atraumatic  Eyes:    PERRL, conjunctiva/corneas clear,  Ears:    Normal TM's and external ear canals, both ears  Nose:   Nares normal, septum midline, mucosa normal, no drainage    or sinus tenderness. OXYGEN ON  - yes . Patient is @ 2L   Throat:   Lips, mucosa, and tongue normal; teeth and gums normal. Cyanosis on lips - no  Neck:   Supple, symmetrical, trachea midline, no adenopathy;    thyroid:  no enlargement/tenderness/nodules; no carotid   bruit or JVD  Back:     Symmetric, no curvature, ROM normal, no CVA tenderness  Lungs:     Distress - no , Wheeze no, Barrell Chest - yes, Purse lip breathing - yes, Crackles - no   Chest Wall:    No tenderness or deformity.    Heart:    Regular rate and rhythm, S1 and S2 normal, no rub   or gallop, Murmur - no  Breast Exam:    NOT DONE  Abdomen:     Soft, non-tender, bowel sounds active all four quadrants,    no masses, no organomegaly. Visceral obesity - no  Genitalia:   NOT DONE  Rectal:    NOT DONE  Extremities:   Extremities - normal, Has Cane - no, Clubbing - no, Edema - no  Pulses:   2+ and symmetric all extremities  Skin:   Stigmata of Connective Tissue Disease - no  Lymph nodes:   Cervical, supraclavicular, and axillary nodes normal  Psychiatric:  Neurologic:   Pleasant - yes, Anxious - no, Flat affect - no  CAm-ICU - neg, Alert and Oriented x 3 - yes, Moves all 4s - yes, Speech - normal, Cognition - intact           Assessment:       ICD-10-CM   1. Chronic respiratory failure with hypoxia (HCC) J96.11   2. COPD GOLD II  J44.9        Plan:     Patient Instructions     ICD-10-CM   1. Chronic respiratory failure with hypoxia (HCC) J96.11   2. COPD GOLD II  J44.9     Stable COPD  Plan -We will add Anoro to the allergy list although I find it hard pressed to suspect that Anoro caused your high blood pressure and high blood sugar -Try to 4 weeks sample of Stiolto daily -Continue Flovent daily -Continue daily oxygen -Glad you are up-to-date with her vaccines -although you will benefit from a repeat booster shot of Pneumovax 23 today  Follow-up -3 months or sooner if needed; CAT score at follow-up     SIGNATURE    Dr. Brand Males, M.D., F.C.C.P,  Pulmonary and Critical Care Medicine Staff Physician, [place] Director - Interstitial Lung Disease  Program  Pulmonary [place] [facility] [place], [place], [address]  Pager: [phone], If no answer or between  15:00h - 7:00h: call [phone] Telephone: [phone]  9:28 AM 08/12/2018

## 2018-08-14 ENCOUNTER — Other Ambulatory Visit: Payer: Self-pay | Admitting: Internal Medicine

## 2018-08-14 MED ORDER — TIOTROPIUM BROMIDE-OLODATEROL 2.5-2.5 MCG/ACT IN AERS: 2.0000 | INHALATION_SPRAY | Freq: Every day | RESPIRATORY_TRACT | 1 refills | Status: DC

## 2018-08-19 ENCOUNTER — Telehealth: Payer: Self-pay | Admitting: Internal Medicine

## 2018-08-19 NOTE — Telephone Encounter (Signed)
Called and spoke with Delfino Lovett, Pharmacists.  They had received a new prescription for Stiolto, and they had recently filled Anoro.  Clarified that Patient is now using Stiolto 2 puffs daily. Nothing further needed. Last OV 08/12/18- Plan -We will add Anoro to the allergy list although I find it hard pressed to suspect that Anoro caused your high blood pressure and high blood sugar -Try to 4 weeks sample of Stiolto daily New prescription sent to pharmacy 08/14/18

## 2018-08-20 ENCOUNTER — Telehealth: Payer: Self-pay | Admitting: Internal Medicine

## 2018-08-20 NOTE — Telephone Encounter (Signed)
Spoke with pt, she states she couldn't get the Stiolto due to the cost. I left samples up front for patient to pick up. The medication may be cheaper when the new year starts. I advised her to check on this and let us know if we need to change her inhaler. Pt understood and nothing further is needed.

## 2018-08-28 ENCOUNTER — Ambulatory Visit (HOSPITAL_COMMUNITY)
Admission: RE | Admit: 2018-08-28 | Discharge: 2018-08-28 | Disposition: A | Discharge status: Home / Self Care | Payer: Medicare Other | Source: Ambulatory Visit | Attending: Radiation Oncology | Admitting: Radiation Oncology

## 2018-08-28 ENCOUNTER — Encounter (HOSPITAL_COMMUNITY): Payer: Self-pay

## 2018-08-28 DIAGNOSIS — R911 Solitary pulmonary nodule: Secondary | ICD-10-CM

## 2018-09-05 ENCOUNTER — Inpatient Hospital Stay (HOSPITAL_COMMUNITY)
Admission: EM | Admit: 2018-09-05 | Discharge: 2018-09-20 | DRG: 193 | Disposition: A | Discharge status: Home Health | Payer: Medicare Other | Attending: Internal Medicine | Admitting: Internal Medicine

## 2018-09-05 ENCOUNTER — Other Ambulatory Visit: Payer: Self-pay

## 2018-09-05 ENCOUNTER — Emergency Department (HOSPITAL_COMMUNITY): Payer: Medicare Other

## 2018-09-05 ENCOUNTER — Encounter (HOSPITAL_COMMUNITY): Payer: Self-pay

## 2018-09-05 DIAGNOSIS — I10 Essential (primary) hypertension: Secondary | ICD-10-CM | POA: Diagnosis not present

## 2018-09-05 DIAGNOSIS — E876 Hypokalemia: Secondary | ICD-10-CM | POA: Diagnosis present

## 2018-09-05 DIAGNOSIS — J189 Pneumonia, unspecified organism: Secondary | ICD-10-CM | POA: Diagnosis present

## 2018-09-05 DIAGNOSIS — Z681 Body mass index (BMI) 19 or less, adult: Secondary | ICD-10-CM

## 2018-09-05 DIAGNOSIS — Z87891 Personal history of nicotine dependence: Secondary | ICD-10-CM

## 2018-09-05 DIAGNOSIS — I5032 Chronic diastolic (congestive) heart failure: Secondary | ICD-10-CM | POA: Diagnosis present

## 2018-09-05 DIAGNOSIS — E875 Hyperkalemia: Secondary | ICD-10-CM | POA: Diagnosis present

## 2018-09-05 DIAGNOSIS — Z7982 Long term (current) use of aspirin: Secondary | ICD-10-CM

## 2018-09-05 DIAGNOSIS — Z953 Presence of xenogenic heart valve: Secondary | ICD-10-CM

## 2018-09-05 DIAGNOSIS — N179 Acute kidney failure, unspecified: Secondary | ICD-10-CM | POA: Diagnosis present

## 2018-09-05 DIAGNOSIS — J939 Pneumothorax, unspecified: Secondary | ICD-10-CM | POA: Diagnosis not present

## 2018-09-05 DIAGNOSIS — K219 Gastro-esophageal reflux disease without esophagitis: Secondary | ICD-10-CM | POA: Diagnosis present

## 2018-09-05 DIAGNOSIS — Z90710 Acquired absence of both cervix and uterus: Secondary | ICD-10-CM

## 2018-09-05 DIAGNOSIS — K589 Irritable bowel syndrome without diarrhea: Secondary | ICD-10-CM | POA: Diagnosis present

## 2018-09-05 DIAGNOSIS — T797XXA Traumatic subcutaneous emphysema, initial encounter: Secondary | ICD-10-CM | POA: Diagnosis present

## 2018-09-05 DIAGNOSIS — J441 Chronic obstructive pulmonary disease with (acute) exacerbation: Secondary | ICD-10-CM | POA: Diagnosis present

## 2018-09-05 DIAGNOSIS — Z8701 Personal history of pneumonia (recurrent): Secondary | ICD-10-CM

## 2018-09-05 DIAGNOSIS — Z9689 Presence of other specified functional implants: Secondary | ICD-10-CM | POA: Diagnosis not present

## 2018-09-05 DIAGNOSIS — I350 Nonrheumatic aortic (valve) stenosis: Secondary | ICD-10-CM | POA: Diagnosis present

## 2018-09-05 DIAGNOSIS — I272 Pulmonary hypertension, unspecified: Secondary | ICD-10-CM | POA: Diagnosis present

## 2018-09-05 DIAGNOSIS — J9601 Acute respiratory failure with hypoxia: Secondary | ICD-10-CM | POA: Diagnosis not present

## 2018-09-05 DIAGNOSIS — Z9981 Dependence on supplemental oxygen: Secondary | ICD-10-CM

## 2018-09-05 DIAGNOSIS — E1110 Type 2 diabetes mellitus with ketoacidosis without coma: Secondary | ICD-10-CM | POA: Diagnosis present

## 2018-09-05 DIAGNOSIS — Z7951 Long term (current) use of inhaled steroids: Secondary | ICD-10-CM

## 2018-09-05 DIAGNOSIS — J181 Lobar pneumonia, unspecified organism: Secondary | ICD-10-CM | POA: Diagnosis not present

## 2018-09-05 DIAGNOSIS — H409 Unspecified glaucoma: Secondary | ICD-10-CM | POA: Diagnosis present

## 2018-09-05 DIAGNOSIS — R0602 Shortness of breath: Secondary | ICD-10-CM

## 2018-09-05 DIAGNOSIS — Z881 Allergy status to other antibiotic agents status: Secondary | ICD-10-CM

## 2018-09-05 DIAGNOSIS — F329 Major depressive disorder, single episode, unspecified: Secondary | ICD-10-CM | POA: Diagnosis present

## 2018-09-05 DIAGNOSIS — I2510 Atherosclerotic heart disease of native coronary artery without angina pectoris: Secondary | ICD-10-CM | POA: Diagnosis present

## 2018-09-05 DIAGNOSIS — T380X5A Adverse effect of glucocorticoids and synthetic analogues, initial encounter: Secondary | ICD-10-CM | POA: Diagnosis present

## 2018-09-05 DIAGNOSIS — C3432 Malignant neoplasm of lower lobe, left bronchus or lung: Secondary | ICD-10-CM | POA: Diagnosis present

## 2018-09-05 DIAGNOSIS — E785 Hyperlipidemia, unspecified: Secondary | ICD-10-CM | POA: Diagnosis present

## 2018-09-05 DIAGNOSIS — J9383 Other pneumothorax: Secondary | ICD-10-CM | POA: Diagnosis present

## 2018-09-05 DIAGNOSIS — K5900 Constipation, unspecified: Secondary | ICD-10-CM | POA: Diagnosis present

## 2018-09-05 DIAGNOSIS — I110 Hypertensive heart disease with heart failure: Secondary | ICD-10-CM | POA: Diagnosis present

## 2018-09-05 DIAGNOSIS — E43 Unspecified severe protein-calorie malnutrition: Secondary | ICD-10-CM

## 2018-09-05 DIAGNOSIS — F419 Anxiety disorder, unspecified: Secondary | ICD-10-CM | POA: Diagnosis present

## 2018-09-05 DIAGNOSIS — Z888 Allergy status to other drugs, medicaments and biological substances status: Secondary | ICD-10-CM

## 2018-09-05 DIAGNOSIS — Z825 Family history of asthma and other chronic lower respiratory diseases: Secondary | ICD-10-CM

## 2018-09-05 DIAGNOSIS — J9621 Acute and chronic respiratory failure with hypoxia: Secondary | ICD-10-CM | POA: Diagnosis present

## 2018-09-05 DIAGNOSIS — J440 Chronic obstructive pulmonary disease with acute lower respiratory infection: Secondary | ICD-10-CM | POA: Diagnosis present

## 2018-09-05 DIAGNOSIS — J9382 Other air leak: Secondary | ICD-10-CM | POA: Diagnosis present

## 2018-09-05 DIAGNOSIS — Z79899 Other long term (current) drug therapy: Secondary | ICD-10-CM

## 2018-09-05 DIAGNOSIS — J9600 Acute respiratory failure, unspecified whether with hypoxia or hypercapnia: Secondary | ICD-10-CM

## 2018-09-05 DIAGNOSIS — Z87442 Personal history of urinary calculi: Secondary | ICD-10-CM

## 2018-09-05 DIAGNOSIS — Z7952 Long term (current) use of systemic steroids: Secondary | ICD-10-CM

## 2018-09-05 DIAGNOSIS — Z923 Personal history of irradiation: Secondary | ICD-10-CM

## 2018-09-05 DIAGNOSIS — Z794 Long term (current) use of insulin: Secondary | ICD-10-CM

## 2018-09-05 DIAGNOSIS — Z951 Presence of aortocoronary bypass graft: Secondary | ICD-10-CM

## 2018-09-05 LAB — I-STAT VENOUS BLOOD GAS, ED
ACID-BASE DEFICIT: 1 mmol/L (ref 0.0–2.0)
Bicarbonate: 24.2 mmol/L (ref 20.0–28.0)
O2 Saturation: 68 %
PH VEN: 7.381 (ref 7.250–7.430)
PO2 VEN: 36 mmHg (ref 32.0–45.0)
TCO2: 25 mmol/L (ref 22–32)
pCO2, Ven: 40.8 mmHg — ABNORMAL LOW (ref 44.0–60.0)

## 2018-09-05 LAB — CBC
HCT: 37.4 % (ref 36.0–46.0)
Hemoglobin: 11.8 g/dL — ABNORMAL LOW (ref 12.0–15.0)
MCH: 28.6 pg (ref 26.0–34.0)
MCHC: 31.6 g/dL (ref 30.0–36.0)
MCV: 90.6 fL (ref 80.0–100.0)
Platelets: 231 10*3/uL (ref 150–400)
RBC: 4.13 MIL/uL (ref 3.87–5.11)
RDW: 13.2 % (ref 11.5–15.5)
WBC: 15.7 10*3/uL — ABNORMAL HIGH (ref 4.0–10.5)
nRBC: 0 % (ref 0.0–0.2)

## 2018-09-05 LAB — CBC WITH DIFFERENTIAL/PLATELET
ABS IMMATURE GRANULOCYTES: 0.08 10*3/uL — AB (ref 0.00–0.07)
BASOS ABS: 0 10*3/uL (ref 0.0–0.1)
Basophils Relative: 0 %
Eosinophils Absolute: 0 10*3/uL (ref 0.0–0.5)
Eosinophils Relative: 0 %
HEMATOCRIT: 40.4 % (ref 36.0–46.0)
HEMOGLOBIN: 12.5 g/dL (ref 12.0–15.0)
IMMATURE GRANULOCYTES: 1 %
LYMPHS ABS: 1.4 10*3/uL (ref 0.7–4.0)
LYMPHS PCT: 11 %
MCH: 28.3 pg (ref 26.0–34.0)
MCHC: 30.9 g/dL (ref 30.0–36.0)
MCV: 91.6 fL (ref 80.0–100.0)
MONOS PCT: 12 %
Monocytes Absolute: 1.5 10*3/uL — ABNORMAL HIGH (ref 0.1–1.0)
NEUTROS ABS: 9.9 10*3/uL — AB (ref 1.7–7.7)
NEUTROS PCT: 76 %
NRBC: 0 % (ref 0.0–0.2)
Platelets: 226 10*3/uL (ref 150–400)
RBC: 4.41 MIL/uL (ref 3.87–5.11)
RDW: 13.2 % (ref 11.5–15.5)
WBC: 12.9 10*3/uL — ABNORMAL HIGH (ref 4.0–10.5)

## 2018-09-05 LAB — BASIC METABOLIC PANEL
ANION GAP: 12 (ref 5–15)
BUN: 31 mg/dL — ABNORMAL HIGH (ref 8–23)
CALCIUM: 8.9 mg/dL (ref 8.9–10.3)
CHLORIDE: 105 mmol/L (ref 98–111)
CO2: 22 mmol/L (ref 22–32)
Creatinine, Ser: 1.35 mg/dL — ABNORMAL HIGH (ref 0.44–1.00)
GFR calc non Af Amer: 36 mL/min — ABNORMAL LOW (ref >60)
GFR, EST AFRICAN AMERICAN: 42 mL/min — AB (ref >60)
Glucose, Bld: 220 mg/dL — ABNORMAL HIGH (ref 70–99)
POTASSIUM: 3.1 mmol/L — AB (ref 3.5–5.1)
Sodium: 139 mmol/L (ref 135–145)

## 2018-09-05 LAB — CREATININE, SERUM
CREATININE: 1.32 mg/dL — AB (ref 0.44–1.00)
GFR calc Af Amer: 43 mL/min — ABNORMAL LOW (ref >60)
GFR, EST NON AFRICAN AMERICAN: 37 mL/min — AB (ref >60)

## 2018-09-05 LAB — GLUCOSE, CAPILLARY: Glucose-Capillary: 148 mg/dL — ABNORMAL HIGH (ref 70–99)

## 2018-09-05 LAB — I-STAT TROPONIN, ED: Troponin i, poc: 0.04 ng/mL (ref 0.00–0.08)

## 2018-09-05 MED ORDER — MAGNESIUM OXIDE 400 (241.3 MG) MG PO TABS
200.0000 mg | ORAL_TABLET | Freq: Every day | ORAL | Status: DC
  Administered 2018-09-06 – 2018-09-12 (×7): 200 mg via ORAL
  Filled 2018-09-05 (×7): qty 1

## 2018-09-05 MED ORDER — OCUVITE-LUTEIN PO CAPS: 1.0000 | ORAL_CAPSULE | Freq: Every morning | ORAL | Status: DC

## 2018-09-05 MED ORDER — SODIUM CHLORIDE 0.9 % IV SOLN
500.0000 mg | Freq: Once | INTRAVENOUS | Status: AC
  Administered 2018-09-05: 500 mg via INTRAVENOUS
  Filled 2018-09-05: qty 500

## 2018-09-05 MED ORDER — METHYLPREDNISOLONE SODIUM SUCC 125 MG IJ SOLR
80.0000 mg | Freq: Four times a day (QID) | INTRAMUSCULAR | Status: DC
  Administered 2018-09-05 – 2018-09-06 (×2): 80 mg via INTRAVENOUS
  Filled 2018-09-05 (×2): qty 2

## 2018-09-05 MED ORDER — VITAMIN D3 25 MCG (1000 UNIT) PO TABS
1000.0000 [IU] | ORAL_TABLET | Freq: Every day | ORAL | Status: DC
  Administered 2018-09-05 – 2018-09-19 (×15): 1000 [IU] via ORAL
  Filled 2018-09-05 (×31): qty 1

## 2018-09-05 MED ORDER — SODIUM CHLORIDE 0.9 % IV SOLN
1.0000 g | Freq: Once | INTRAVENOUS | Status: AC
  Administered 2018-09-05: 1 g via INTRAVENOUS
  Filled 2018-09-05: qty 10

## 2018-09-05 MED ORDER — IRBESARTAN 150 MG PO TABS: 150.0000 mg | ORAL_TABLET | Freq: Every day | ORAL | Status: DC

## 2018-09-05 MED ORDER — ACETAMINOPHEN 500 MG PO TABS
500.0000 mg | ORAL_TABLET | Freq: Four times a day (QID) | ORAL | Status: DC | PRN
  Administered 2018-09-06 – 2018-09-18 (×5): 500 mg via ORAL
  Filled 2018-09-05 (×7): qty 1

## 2018-09-05 MED ORDER — FLUTICASONE PROPIONATE HFA 220 MCG/ACT IN AERO
2.0000 | INHALATION_SPRAY | Freq: Two times a day (BID) | RESPIRATORY_TRACT | Status: DC
Start: 2018-09-05 — End: 2018-09-05

## 2018-09-05 MED ORDER — FUROSEMIDE 20 MG PO TABS: 20.0000 mg | ORAL_TABLET | ORAL | Status: DC

## 2018-09-05 MED ORDER — POTASSIUM CHLORIDE 10 MEQ/100ML IV SOLN
10.0000 meq | Freq: Once | INTRAVENOUS | Status: AC
  Administered 2018-09-05: 10 meq via INTRAVENOUS
  Filled 2018-09-05: qty 100

## 2018-09-05 MED ORDER — ALBUTEROL SULFATE HFA 108 (90 BASE) MCG/ACT IN AERS: 2.0000 | INHALATION_SPRAY | Freq: Four times a day (QID) | RESPIRATORY_TRACT | Status: DC | PRN

## 2018-09-05 MED ORDER — ASPIRIN EC 81 MG PO TBEC
81.0000 mg | DELAYED_RELEASE_TABLET | Freq: Every day | ORAL | Status: DC
  Administered 2018-09-05 – 2018-09-19 (×15): 81 mg via ORAL
  Filled 2018-09-05 (×15): qty 1

## 2018-09-05 MED ORDER — AMLODIPINE BESYLATE 10 MG PO TABS
10.0000 mg | ORAL_TABLET | Freq: Every day | ORAL | Status: DC
  Administered 2018-09-06 – 2018-09-20 (×15): 10 mg via ORAL
  Filled 2018-09-05 (×15): qty 1

## 2018-09-05 MED ORDER — BUPROPION HCL ER (XL) 150 MG PO TB24
150.0000 mg | ORAL_TABLET | Freq: Every day | ORAL | Status: DC
  Administered 2018-09-06 – 2018-09-20 (×15): 150 mg via ORAL
  Filled 2018-09-05 (×15): qty 1

## 2018-09-05 MED ORDER — SERTRALINE HCL 100 MG PO TABS
100.0000 mg | ORAL_TABLET | Freq: Every morning | ORAL | Status: DC
  Administered 2018-09-06 – 2018-09-20 (×15): 100 mg via ORAL
  Filled 2018-09-05 (×15): qty 1

## 2018-09-05 MED ORDER — FERROUS SULFATE 325 (65 FE) MG PO TABS
325.0000 mg | ORAL_TABLET | Freq: Two times a day (BID) | ORAL | Status: DC
  Administered 2018-09-05 – 2018-09-20 (×30): 325 mg via ORAL
  Filled 2018-09-05 (×31): qty 1

## 2018-09-05 MED ORDER — SODIUM CHLORIDE 0.9 % IV SOLN
INTRAVENOUS | Status: DC
  Administered 2018-09-05 – 2018-09-06 (×3): via INTRAVENOUS

## 2018-09-05 MED ORDER — MAGNESIUM 250 MG PO TABS: 250.0000 mg | ORAL_TABLET | Freq: Every day | ORAL | Status: DC

## 2018-09-05 MED ORDER — UMECLIDINIUM-VILANTEROL 62.5-25 MCG/INH IN AEPB
1.0000 | INHALATION_SPRAY | Freq: Every day | RESPIRATORY_TRACT | Status: DC
  Administered 2018-09-07 – 2018-09-20 (×12): 1 via RESPIRATORY_TRACT
  Filled 2018-09-05 (×2): qty 14

## 2018-09-05 MED ORDER — SODIUM CHLORIDE 0.9 % IV SOLN
500.0000 mg | INTRAVENOUS | Status: DC
  Administered 2018-09-06 – 2018-09-07 (×2): 500 mg via INTRAVENOUS
  Filled 2018-09-05 (×2): qty 500

## 2018-09-05 MED ORDER — PRAVASTATIN SODIUM 10 MG PO TABS
20.0000 mg | ORAL_TABLET | Freq: Every day | ORAL | Status: DC
  Administered 2018-09-05 – 2018-09-19 (×15): 20 mg via ORAL
  Filled 2018-09-05 (×15): qty 2

## 2018-09-05 MED ORDER — BUDESONIDE 0.5 MG/2ML IN SUSP
0.5000 mg | Freq: Two times a day (BID) | RESPIRATORY_TRACT | Status: DC
  Administered 2018-09-06 – 2018-09-20 (×29): 0.5 mg via RESPIRATORY_TRACT
  Filled 2018-09-05 (×29): qty 2

## 2018-09-05 MED ORDER — ENOXAPARIN SODIUM 40 MG/0.4ML ~~LOC~~ SOLN
40.0000 mg | SUBCUTANEOUS | Status: DC
  Administered 2018-09-05: 40 mg via SUBCUTANEOUS
  Filled 2018-09-05: qty 0.4

## 2018-09-05 MED ORDER — CLONIDINE HCL 0.1 MG PO TABS
0.1000 mg | ORAL_TABLET | Freq: Two times a day (BID) | ORAL | Status: DC
  Administered 2018-09-05 – 2018-09-08 (×7): 0.1 mg via ORAL
  Filled 2018-09-05 (×8): qty 1

## 2018-09-05 MED ORDER — SODIUM CHLORIDE 0.9 % IV SOLN
1.0000 g | INTRAVENOUS | Status: DC
  Administered 2018-09-06 – 2018-09-11 (×6): 1 g via INTRAVENOUS
  Filled 2018-09-05: qty 10
  Filled 2018-09-05: qty 1
  Filled 2018-09-05 (×4): qty 10

## 2018-09-05 MED ORDER — GUAIFENESIN 100 MG/5ML PO SOLN
5.0000 mL | ORAL | Status: DC | PRN
  Administered 2018-09-05 – 2018-09-19 (×10): 100 mg via ORAL
  Filled 2018-09-05 (×12): qty 5

## 2018-09-05 MED ORDER — ALENDRONATE SODIUM 70 MG PO TABS: 70.0000 mg | ORAL_TABLET | ORAL | Status: DC

## 2018-09-05 MED ORDER — ALBUTEROL SULFATE (2.5 MG/3ML) 0.083% IN NEBU
2.5000 mg | INHALATION_SOLUTION | Freq: Four times a day (QID) | RESPIRATORY_TRACT | Status: DC | PRN
  Administered 2018-09-07 – 2018-09-17 (×5): 2.5 mg via RESPIRATORY_TRACT
  Filled 2018-09-05 (×6): qty 3

## 2018-09-05 NOTE — ED Provider Notes (Addendum)
[facility] EMERGENCY DEPARTMENT Provider Note   CSN: [id number] Arrival date & time: 09/05/18  1408     History   Chief Complaint Chief Complaint  Patient presents with  . Shortness of Breath    HPI [name] is a 84 y.o. female with a history of aortic stenosis, emphysema, GERD, hyperlipidemia, HTN, and asthma who presents to the emergency department from her PCP's office with a chief complaint of shortness of breath.  Triage staff reports the patient was standing at 65% on her home 2 L in triage and was increased to 4 L.  The patient endorses constant, worsening shortness of breath with productive cough and chest congestion, onset 2 to 3 days ago.  The patient also reports that she has been a little bit more "wobbly" with walking and her husband says she has seemed somewhat more confused since her shortness of breath worsened.  States she may have a minimal amount of swelling in her bilateral lower extremities, but denies orthopnea.   She denies fever, chills, body aches, chest pain, abdominal pain, palpitations, rash, nausea, vomiting, diarrhea, headache, numbness, weakness, or nasal congestion.   She reports that she has been told that she can use her nebulizers up to 3 times daily, but is only been using it 2 times daily over the last few days.  She has a 30-pack-year smoking history (2ppd/15 years).  She wears 2 L of home O2 chronically.  The history is provided by the patient and the spouse. No language interpreter was used.    Past Medical History:  Diagnosis Date  . Anxiety   . Aortic stenosis   . Arthritis   . Asthma    uses inhaler as needed  . Depression   . Diabetes mellitus   . Emphysema of lung ([place])   . GERD (gastroesophageal reflux disease)   . Glaucoma   . Hyperlipidemia   . Hypertension   . [name]   . Murmur   . Nephrolithiasis   . Pneumonia 3/17 and 3/18    Patient Active Problem List   Diagnosis  Date Noted  . Acute pneumothorax   . Pneumothorax on left   . Protein-calorie malnutrition, severe 09/06/2018  . Pneumonia 09/05/2018  . Pulmonary nodule 12/03/2017  . Iron deficiency anemia   . Heme + stool   . CAD (coronary artery disease) 10/27/2017  . Microcytic anemia 10/27/2017  . Anemia due to GI blood loss 10/27/2017  . COPD with acute exacerbation ([place]) 10/27/2017  . Pulmonary nodule, left 10/15/2017  . Anemia, chronic disease 12/07/2016  . Chronic obstructive pulmonary disease ([place])   . Acute on chronic respiratory failure with hypoxia ([place]) 11/19/2016  . CKD (chronic kidney disease), stage III ([place]) 11/19/2016  . Diabetes mellitus type 2, uncontrolled ([place]) 11/19/2016  . Recurrent pneumonia 11/19/2016  . Chronic diastolic CHF (congestive heart failure) ([employer]) 11/19/2016  . Pulmonary HTN ([place]) 11/19/2016  . HLD (hyperlipidemia) 11/19/2016  . Hiatal hernia 05/01/2016  . History of bacterial pneumonia 05/01/2016  . COPD exacerbation ([place]) 11/18/2015  . Physical deconditioning 07/24/2013  . S/P AVR (aortic valve replacement) 07/23/2013  . S/P CABG x 1 07/23/2013  . Aortic stenosis 06/10/2013  . Essential hypertension 06/10/2013  . COPD GOLD II  10/26/2011    Past Surgical History:  Procedure Laterality Date  . ABDOMINAL HYSTERECTOMY    . ANKLE SURGERY  1998   d/t fx.  Left ankle  . ANTERIOR AND POSTERIOR REPAIR  06/21/2011   Procedure: ANTERIOR (CYSTOCELE) AND POSTERIOR REPAIR (RECTOCELE);  Surgeon: Maisie Fus;  Location: [facility];  Service: Gynecology;  Laterality: N/A;  . AORTIC VALVE REPLACEMENT N/A 07/17/2013   Procedure: AORTIC VALVE REPLACEMENT (AVR);  Surgeon: Ivin Poot, MD;  Location: [facility];  Service: Open Heart Surgery;  Laterality: N/A;  . CARDIAC CATHETERIZATION    . CARDIAC VALVE REPLACEMENT    . CORONARY ANGIOPLASTY    . CORONARY ARTERY BYPASS GRAFT N/A 07/17/2013   Procedure: CORONARY ARTERY BYPASS GRAFT, TIMES ONE, ON PUMP, USING RIGHT INTERNAL  MAMMARY ARTERY.;  Surgeon: Ivin Poot, MD;  Location: [facility];  Service: Open Heart Surgery;  Laterality: N/A;  . FACIAL COSMETIC SURGERY     face lift  . INTRAOPERATIVE TRANSESOPHAGEAL ECHOCARDIOGRAM N/A 07/17/2013   Procedure: INTRAOPERATIVE TRANSESOPHAGEAL ECHOCARDIOGRAM;  Surgeon: Ivin Poot, MD;  Location: [facility];  Service: Open Heart Surgery;  Laterality: N/A;  . LAPAROSCOPIC ASSISTED VAGINAL HYSTERECTOMY  06/21/2011   Procedure: LAPAROSCOPIC ASSISTED VAGINAL HYSTERECTOMY;  Surgeon: Maisie Fus;  Location: [facility];  Service: Gynecology;  Laterality: N/A;  . LEFT AND RIGHT HEART CATHETERIZATION WITH CORONARY ANGIOGRAM N/A 06/27/2013   Procedure: LEFT AND RIGHT HEART CATHETERIZATION WITH CORONARY ANGIOGRAM;  Surgeon: Ramond Dial, MD;  Location[facility];  Service: Cardiovascular;  Laterality: N/A;  . NOSE SURGERY    . SALPINGOOPHORECTOMY  06/21/2011   Procedure: SALPINGO OOPHERECTOMY;  Surgeon: Maisie Fus;  Location: [facility];  Service: Gynecology;  Laterality: Bilateral;  . VAGINAL PROLAPSE REPAIR  06/21/2011   Procedure: SACROPEXY;  Surgeon: Maisie Fus;  Location: [facility];  Service: Gynecology;  Laterality: N/A;  sacrospinous ligament suspension     OB History   No obstetric history on file.      Home Medications    Prior to Admission medications   Medication Sig Start Date End Date Taking? Authorizing Provider  acetaminophen (TYLENOL) 500 MG tablet Take 500 mg by mouth every 6 (six) hours as needed for headache.   Yes [provider]  albuterol (PROVENTIL) (2.5 MG/3ML) 0.083% nebulizer solution Take 3 mLs (2.5 mg total) by nebulization every 6 (six) hours as needed for wheezing. 11/23/16  Yes Eugenie Filler, MD  alendronate (FOSAMAX) 70 MG tablet Take 70 mg by mouth once a week. Take with a full glass of water on an empty stomach.   Yes [provider]  amLODipine (NORVASC) 10 MG tablet Take 1 tablet (10 mg total) by mouth daily. 10/19/15  Yes  Lelon Perla, MD  aspirin EC 81 MG tablet Take 81 mg by mouth at bedtime.   Yes [provider]  buPROPion (WELLBUTRIN XL) 150 MG 24 hr tablet Take 150 mg by mouth daily. 06/04/17  Yes [provider]  cholecalciferol (VITAMIN D) 1000 units tablet Take 1,000 Units by mouth at bedtime.    Yes [provider]  cloNIDine (CATAPRES) 0.1 MG tablet Take 0.1 mg by mouth 2 (two) times daily. 06/25/18  Yes [provider]  Docusate Sodium (COLACE PO) Take 100 mg by mouth 2 (two) times daily.    Yes [provider]  Ferrous Sulfate (IRON) 325 (65 Fe) MG TABS Take 325 mg by mouth 2 (two) times daily.    Yes [provider]  fluticasone (FLOVENT HFA) 220 MCG/ACT inhaler Inhale 2 puffs into the lungs 2 (two) times daily.   Yes [provider]  furosemide (LASIX) 40 MG tablet Take 0.5  tablets (20 mg total) by mouth every other day. 10/29/17  Yes Hongalgi, Lenis Dickinson, MD  guaiFENesin (MUCINEX) 600 MG 12 hr tablet Take 600 mg by mouth 2 (two) times daily.   Yes [provider]  irbesartan (AVAPRO) 150 MG tablet Take 1 tablet (150 mg total) by mouth daily. 10/12/14  Yes Lelon Perla, MD  Magnesium 250 MG TABS Take 250 mg by mouth at bedtime.    Yes [provider]  Multiple Vitamins-Minerals (OCUVITE EYE HEALTH FORMULA PO) Take 1 capsule by mouth daily.    Yes [provider]  pravastatin (PRAVACHOL) 40 MG tablet Take 40 mg by mouth every evening.    Yes [provider]  PROAIR HFA 108 (90 BASE) MCG/ACT inhaler Inhale 2 puffs into the lungs 4 (four) times daily as needed for wheezing or shortness of breath.  02/24/14  Yes [provider]  sertraline (ZOLOFT) 100 MG tablet Take 1 tablet (100 mg total) by mouth every morning. 11/24/16  Yes Eugenie Filler, MD  ANORO ELLIPTA 62.5-25 MCG/INH AEPB USE 1 INHALATION BY MOUTH  DAILY Patient not taking: Reported on 08/08/2018 03/27/18   Brand Males, MD    predniSONE (DELTASONE) 10 MG tablet Take 2 tabs x 5 days Patient not taking: Reported on 08/08/2018 07/08/18   Martyn Ehrich, NP  Tiotropium Bromide-Olodaterol (STIOLTO RESPIMAT) 2.5-2.5 MCG/ACT AERS Inhale 2 puffs into the lungs daily. 08/14/18   Brand Males, MD    Family History Family History  Problem Relation Age of Onset  . Emphysema Father   . Colon cancer Neg Hx   . Esophageal cancer Neg Hx   . Rectal cancer Neg Hx   . Stomach cancer Neg Hx     Social History Social History   Tobacco Use  . Smoking status: Former Smoker    Packs/day: 2.00    Years: 15.00    Pack years: 30.00    Types: Cigarettes    Last attempt to quit: 09/18/1972    Years since quitting: 46.0  . Smokeless tobacco: Never Used  Substance Use Topics  . Alcohol use: No    Alcohol/week: 0.0 standard drinks  . Drug use: No     Allergies   Lipitor [atorvastatin]; Lisinopril; Amaryl [glimepiride]; Anoro ellipta [umeclidinium-vilanterol]; Avelox [moxifloxacin hcl in nacl]; Ciprofloxacin; Other; Prevacid [lansoprazole]; Spiriva handihaler [tiotropium bromide monohydrate]; and Symbicort [budesonide-formoterol fumarate]   Review of Systems Review of Systems  Constitutional: Negative for activity change, chills and fever.  HENT: Negative for ear pain and sore throat.   Respiratory: Positive for cough and shortness of breath.   Cardiovascular: Positive for leg swelling. Negative for chest pain and palpitations.  Gastrointestinal: Negative for abdominal pain, diarrhea, nausea and vomiting.  Genitourinary: Negative for dysuria, hematuria, urgency, vaginal bleeding, vaginal discharge and vaginal pain.  Musculoskeletal: Negative for back pain.  Skin: Negative for rash.  Allergic/Immunologic: Negative for immunocompromised state.  Neurological: Negative for dizziness, weakness, numbness and headaches.  Psychiatric/Behavioral: Negative for confusion.   Physical Exam Updated Vital Signs BP (!)  162/70   Pulse (!) 51   Temp 98.3 F (36.8 C) (Oral)   Resp 16   Ht 5' (1.524 m)   Wt 45.4 kg   SpO2 93%   BMI 19.53 kg/m   Physical Exam Vitals signs and nursing note reviewed.  Constitutional:      General: She is not in acute distress.    Comments: Cachectic and chronically ill-appearing elderly female  HENT:  Head: Normocephalic and atraumatic.  Eyes:     Conjunctiva/sclera: Conjunctivae normal.     Pupils: Pupils are equal, round, and reactive to light.  Neck:     Musculoskeletal: Normal range of motion and neck supple.  Cardiovascular:     Rate and Rhythm: Normal rate and regular rhythm.     Pulses: Normal pulses.     Heart sounds: Normal heart sounds. No murmur. No friction rub. No gallop.   Pulmonary:     Effort: Pulmonary effort is normal. No respiratory distress.     Comments: Rhonchorous breath sounds throughout bilaterally Abdominal:     General: There is no distension.     Palpations: Abdomen is soft.  Musculoskeletal:     Comments: Bilateral lower extremity trace edema  Skin:    General: Skin is warm.     Findings: No rash.  Neurological:     Mental Status: She is alert.  Psychiatric:        Behavior: Behavior normal.    ED Treatments / Results  Labs (all labs ordered are listed, but only abnormal results are displayed) Labs Reviewed  CBC WITH DIFFERENTIAL/PLATELET - Abnormal; Notable for the following components:      Result Value   WBC 12.9 (*)    Neutro Abs 9.9 (*)    Monocytes Absolute 1.5 (*)    Abs Immature Granulocytes 0.08 (*)    All other components within normal limits  BASIC METABOLIC PANEL - Abnormal; Notable for the following components:   Potassium 3.1 (*)    Glucose, Bld 220 (*)    BUN 31 (*)    Creatinine, Ser 1.35 (*)    GFR calc non Af Amer 36 (*)    GFR calc Af Amer 42 (*)    All other components within normal limits  COMPREHENSIVE METABOLIC PANEL - Abnormal; Notable for the following components:   CO2 19 (*)     Glucose, Bld 232 (*)    BUN 30 (*)    Creatinine, Ser 1.38 (*)    Calcium 8.8 (*)    Albumin 3.1 (*)    AST 49 (*)    GFR calc non Af Amer 35 (*)    GFR calc Af Amer 41 (*)    Anion gap 16 (*)    All other components within normal limits  CBC - Abnormal; Notable for the following components:   WBC 15.7 (*)    Hemoglobin 11.8 (*)    All other components within normal limits  CREATININE, SERUM - Abnormal; Notable for the following components:   Creatinine, Ser 1.32 (*)    GFR calc non Af Amer 37 (*)    GFR calc Af Amer 43 (*)    All other components within normal limits  CBC - Abnormal; Notable for the following components:   WBC 13.4 (*)    Hemoglobin 11.9 (*)    All other components within normal limits  GLUCOSE, CAPILLARY - Abnormal; Notable for the following components:   Glucose-Capillary 148 (*)    All other components within normal limits  GLUCOSE, CAPILLARY - Abnormal; Notable for the following components:   Glucose-Capillary 233 (*)    All other components within normal limits  BASIC METABOLIC PANEL - Abnormal; Notable for the following components:   CO2 19 (*)    Glucose, Bld 444 (*)    BUN 37 (*)    Creatinine, Ser 1.16 (*)    Calcium 8.4 (*)    GFR calc non Af  Amer 44 (*)    GFR calc Af Amer 50 (*)    All other components within normal limits  BETA-HYDROXYBUTYRIC ACID - Abnormal; Notable for the following components:   Beta-Hydroxybutyric Acid 0.94 (*)    All other components within normal limits  GLUCOSE, CAPILLARY - Abnormal; Notable for the following components:   Glucose-Capillary 389 (*)    All other components within normal limits  BASIC METABOLIC PANEL - Abnormal; Notable for the following components:   CO2 19 (*)    Glucose, Bld 496 (*)    BUN 40 (*)    Creatinine, Ser 1.39 (*)    Calcium 8.5 (*)    GFR calc non Af Amer 35 (*)    GFR calc Af Amer 41 (*)    All other components within normal limits  BASIC METABOLIC PANEL - Abnormal; Notable for  the following components:   CO2 18 (*)    Glucose, Bld 253 (*)    BUN 46 (*)    Creatinine, Ser 1.52 (*)    Calcium 8.6 (*)    GFR calc non Af Amer 31 (*)    GFR calc Af Amer 36 (*)    All other components within normal limits  HEMOGLOBIN A1C - Abnormal; Notable for the following components:   Hgb A1c MFr Bld 7.4 (*)    All other components within normal limits  GLUCOSE, CAPILLARY - Abnormal; Notable for the following components:   Glucose-Capillary 331 (*)    All other components within normal limits  GLUCOSE, CAPILLARY - Abnormal; Notable for the following components:   Glucose-Capillary 284 (*)    All other components within normal limits  BASIC METABOLIC PANEL - Abnormal; Notable for the following components:   Glucose, Bld 185 (*)    BUN 45 (*)    Creatinine, Ser 1.25 (*)    Calcium 8.0 (*)    GFR calc non Af Amer 40 (*)    GFR calc Af Amer 46 (*)    All other components within normal limits  GLUCOSE, CAPILLARY - Abnormal; Notable for the following components:   Glucose-Capillary 165 (*)    All other components within normal limits  GLUCOSE, CAPILLARY - Abnormal; Notable for the following components:   Glucose-Capillary 179 (*)    All other components within normal limits  GLUCOSE, CAPILLARY - Abnormal; Notable for the following components:   Glucose-Capillary 165 (*)    All other components within normal limits  GLUCOSE, CAPILLARY - Abnormal; Notable for the following components:   Glucose-Capillary 196 (*)    All other components within normal limits  BASIC METABOLIC PANEL - Abnormal; Notable for the following components:   CO2 18 (*)    Glucose, Bld 172 (*)    BUN 47 (*)    Creatinine, Ser 1.22 (*)    Calcium 8.5 (*)    GFR calc non Af Amer 41 (*)    GFR calc Af Amer 47 (*)    All other components within normal limits  GLUCOSE, CAPILLARY - Abnormal; Notable for the following components:   Glucose-Capillary 160 (*)    All other components within normal limits    CBC - Abnormal; Notable for the following components:   WBC 13.3 (*)    RBC 3.43 (*)    Hemoglobin 10.1 (*)    HCT 31.3 (*)    All other components within normal limits  GLUCOSE, CAPILLARY - Abnormal; Notable for the following components:   Glucose-Capillary 155 (*)  All other components within normal limits  GLUCOSE, CAPILLARY - Abnormal; Notable for the following components:   Glucose-Capillary 161 (*)    All other components within normal limits  GLUCOSE, CAPILLARY - Abnormal; Notable for the following components:   Glucose-Capillary 145 (*)    All other components within normal limits  GLUCOSE, CAPILLARY - Abnormal; Notable for the following components:   Glucose-Capillary 128 (*)    All other components within normal limits  GLUCOSE, CAPILLARY - Abnormal; Notable for the following components:   Glucose-Capillary 130 (*)    All other components within normal limits  GLUCOSE, CAPILLARY - Abnormal; Notable for the following components:   Glucose-Capillary 156 (*)    All other components within normal limits  GLUCOSE, CAPILLARY - Abnormal; Notable for the following components:   Glucose-Capillary 163 (*)    All other components within normal limits  GLUCOSE, CAPILLARY - Abnormal; Notable for the following components:   Glucose-Capillary 186 (*)    All other components within normal limits  GLUCOSE, CAPILLARY - Abnormal; Notable for the following components:   Glucose-Capillary 190 (*)    All other components within normal limits  GLUCOSE, CAPILLARY - Abnormal; Notable for the following components:   Glucose-Capillary 233 (*)    All other components within normal limits  GLUCOSE, CAPILLARY - Abnormal; Notable for the following components:   Glucose-Capillary 256 (*)    All other components within normal limits  CBC - Abnormal; Notable for the following components:   WBC 12.9 (*)    RBC 3.65 (*)    Hemoglobin 10.3 (*)    HCT 33.1 (*)    All other components within  normal limits  GLUCOSE, CAPILLARY - Abnormal; Notable for the following components:   Glucose-Capillary 168 (*)    All other components within normal limits  GLUCOSE, CAPILLARY - Abnormal; Notable for the following components:   Glucose-Capillary 111 (*)    All other components within normal limits  BASIC METABOLIC PANEL - Abnormal; Notable for the following components:   Glucose, Bld 133 (*)    BUN 32 (*)    Creatinine, Ser 1.18 (*)    GFR calc non Af Amer 43 (*)    GFR calc Af Amer 49 (*)    All other components within normal limits  GLUCOSE, CAPILLARY - Abnormal; Notable for the following components:   Glucose-Capillary 192 (*)    All other components within normal limits  GLUCOSE, CAPILLARY - Abnormal; Notable for the following components:   Glucose-Capillary 139 (*)    All other components within normal limits  CBC - Abnormal; Notable for the following components:   WBC 12.9 (*)    RBC 3.75 (*)    Hemoglobin 10.9 (*)    HCT 34.2 (*)    All other components within normal limits  GLUCOSE, CAPILLARY - Abnormal; Notable for the following components:   Glucose-Capillary 228 (*)    All other components within normal limits  GLUCOSE, CAPILLARY - Abnormal; Notable for the following components:   Glucose-Capillary 101 (*)    All other components within normal limits  GLUCOSE, CAPILLARY - Abnormal; Notable for the following components:   Glucose-Capillary 234 (*)    All other components within normal limits  GLUCOSE, CAPILLARY - Abnormal; Notable for the following components:   Glucose-Capillary 238 (*)    All other components within normal limits  CBC - Abnormal; Notable for the following components:   WBC 11.0 (*)    RBC 3.59 (*)  Hemoglobin 10.5 (*)    HCT 33.0 (*)    All other components within normal limits  GLUCOSE, CAPILLARY - Abnormal; Notable for the following components:   Glucose-Capillary 130 (*)    All other components within normal limits  GLUCOSE, CAPILLARY  - Abnormal; Notable for the following components:   Glucose-Capillary 110 (*)    All other components within normal limits  GLUCOSE, CAPILLARY - Abnormal; Notable for the following components:   Glucose-Capillary 278 (*)    All other components within normal limits  GLUCOSE, CAPILLARY - Abnormal; Notable for the following components:   Glucose-Capillary 206 (*)    All other components within normal limits  CBC - Abnormal; Notable for the following components:   WBC 13.7 (*)    RBC 3.66 (*)    Hemoglobin 10.6 (*)    HCT 33.3 (*)    All other components within normal limits  GLUCOSE, CAPILLARY - Abnormal; Notable for the following components:   Glucose-Capillary 205 (*)    All other components within normal limits  GLUCOSE, CAPILLARY - Abnormal; Notable for the following components:   Glucose-Capillary 242 (*)    All other components within normal limits  BASIC METABOLIC PANEL - Abnormal; Notable for the following components:   BUN 33 (*)    Creatinine, Ser 1.22 (*)    Calcium 8.8 (*)    GFR calc non Af Amer 41 (*)    GFR calc Af Amer 47 (*)    All other components within normal limits  GLUCOSE, CAPILLARY - Abnormal; Notable for the following components:   Glucose-Capillary 270 (*)    All other components within normal limits  GLUCOSE, CAPILLARY - Abnormal; Notable for the following components:   Glucose-Capillary 108 (*)    All other components within normal limits  BASIC METABOLIC PANEL - Abnormal; Notable for the following components:   Potassium 5.9 (*)    Glucose, Bld 145 (*)    BUN 33 (*)    Creatinine, Ser 1.22 (*)    Calcium 8.8 (*)    GFR calc non Af Amer 41 (*)    GFR calc Af Amer 47 (*)    All other components within normal limits  GLUCOSE, CAPILLARY - Abnormal; Notable for the following components:   Glucose-Capillary 299 (*)    All other components within normal limits  GLUCOSE, CAPILLARY - Abnormal; Notable for the following components:   Glucose-Capillary  268 (*)    All other components within normal limits  GLUCOSE, CAPILLARY - Abnormal; Notable for the following components:   Glucose-Capillary 126 (*)    All other components within normal limits  GLUCOSE, CAPILLARY - Abnormal; Notable for the following components:   Glucose-Capillary 219 (*)    All other components within normal limits  BASIC METABOLIC PANEL - Abnormal; Notable for the following components:   Potassium 3.3 (*)    BUN 24 (*)    Creatinine, Ser 1.14 (*)    Calcium 8.6 (*)    GFR calc non Af Amer 44 (*)    GFR calc Af Amer 51 (*)    All other components within normal limits  GLUCOSE, CAPILLARY - Abnormal; Notable for the following components:   Glucose-Capillary 278 (*)    All other components within normal limits  GLUCOSE, CAPILLARY - Abnormal; Notable for the following components:   Glucose-Capillary 207 (*)    All other components within normal limits  GLUCOSE, CAPILLARY - Abnormal; Notable for the following components:   Glucose-Capillary  103 (*)    All other components within normal limits  GLUCOSE, CAPILLARY - Abnormal; Notable for the following components:   Glucose-Capillary 266 (*)    All other components within normal limits  GLUCOSE, CAPILLARY - Abnormal; Notable for the following components:   Glucose-Capillary 282 (*)    All other components within normal limits  BASIC METABOLIC PANEL - Abnormal; Notable for the following components:   Glucose, Bld 170 (*)    BUN 31 (*)    Creatinine, Ser 1.41 (*)    GFR calc non Af Amer 34 (*)    GFR calc Af Amer 40 (*)    All other components within normal limits  CBC - Abnormal; Notable for the following components:   WBC 14.3 (*)    Hemoglobin 10.8 (*)    HCT 35.2 (*)    All other components within normal limits  GLUCOSE, CAPILLARY - Abnormal; Notable for the following components:   Glucose-Capillary 303 (*)    All other components within normal limits  GLUCOSE, CAPILLARY - Abnormal; Notable for the  following components:   Glucose-Capillary 127 (*)    All other components within normal limits  GLUCOSE, CAPILLARY - Abnormal; Notable for the following components:   Glucose-Capillary 151 (*)    All other components within normal limits  GLUCOSE, CAPILLARY - Abnormal; Notable for the following components:   Glucose-Capillary 302 (*)    All other components within normal limits  BASIC METABOLIC PANEL - Abnormal; Notable for the following components:   Glucose, Bld 170 (*)    BUN 32 (*)    Creatinine, Ser 1.04 (*)    GFR calc non Af Amer 49 (*)    GFR calc Af Amer 57 (*)    All other components within normal limits  CBC - Abnormal; Notable for the following components:   WBC 13.5 (*)    RBC 3.59 (*)    Hemoglobin 10.2 (*)    HCT 32.7 (*)    All other components within normal limits  GLUCOSE, CAPILLARY - Abnormal; Notable for the following components:   Glucose-Capillary 317 (*)    All other components within normal limits  GLUCOSE, CAPILLARY - Abnormal; Notable for the following components:   Glucose-Capillary 103 (*)    All other components within normal limits  GLUCOSE, CAPILLARY - Abnormal; Notable for the following components:   Glucose-Capillary 233 (*)    All other components within normal limits  GLUCOSE, CAPILLARY - Abnormal; Notable for the following components:   Glucose-Capillary 165 (*)    All other components within normal limits  GLUCOSE, CAPILLARY - Abnormal; Notable for the following components:   Glucose-Capillary 234 (*)    All other components within normal limits  GLUCOSE, CAPILLARY - Abnormal; Notable for the following components:   Glucose-Capillary 112 (*)    All other components within normal limits  GLUCOSE, CAPILLARY - Abnormal; Notable for the following components:   Glucose-Capillary 195 (*)    All other components within normal limits  I-STAT VENOUS BLOOD GAS, ED - Abnormal; Notable for the following components:   pCO2, Ven 40.8 (*)    All  other components within normal limits  CULTURE, BLOOD (ROUTINE X 2)  CULTURE, BLOOD (ROUTINE X 2)  HIV ANTIBODY (ROUTINE TESTING W REFLEX)  LACTIC ACID, PLASMA  INFLUENZA PANEL BY PCR (TYPE A & B)  MAGNESIUM  PHOSPHORUS  GLUCOSE, CAPILLARY  GLUCOSE, CAPILLARY  MAGNESIUM  PHOSPHORUS  GLUCOSE, CAPILLARY  MAGNESIUM  PHOSPHORUS  POTASSIUM  MAGNESIUM  PHOSPHORUS  I-STAT TROPONIN, ED    EKG EKG Interpretation  Date/Time:  Thursday September 05 2018 14:21:15 EST Ventricular Rate:  79 PR Interval:    QRS Duration: 144 QT Interval:  438 QTC Calculation: 503 R Axis:   -94 Text Interpretation:  Sinus rhythm Probable left atrial enlargement RBBB and LAFB Baseline wander in lead(s) V3 No significant change since last tracing Confirmed by Fredia Sorrow [phone]) on 09/05/2018 5:51:08 PM   Radiology Dg Chest Port 1 View  Result Date: 09/17/2018 CLINICAL DATA:  History of left pneumothorax with a chest tube in place. EXAM: PORTABLE CHEST 1 VIEW COMPARISON:  Single-view of the chest earlier today. FINDINGS: Left chest tube is in place. There is a left apical pneumothorax estimated at 20%. Bibasilar airspace disease is worse on the right and unchanged in appearance. Severe emphysema is seen. Heart size is normal. Aortic atherosclerosis is noted. IMPRESSION: Left pneumothorax estimated at 20% with a chest tube in place. No change in right greater than left basilar airspace disease. Emphysema. Atherosclerosis. Electronically Signed   By: Inge Rise M.D.   On: 09/17/2018 14:44   Dg Chest Port 1 View  Result Date: 09/17/2018 CLINICAL DATA:  Chest tube.  Shortness of breath.  Sore chest. EXAM: PORTABLE CHEST 1 VIEW COMPARISON:  09/15/2018. FINDINGS: Left chest tube in stable position. Again no definite pneumothorax noted. Prior cardiac valve replacement. Heart size normal. Bibasilar atelectasis/infiltrates and small pleural effusions again noted. IMPRESSION: 1. Left chest tube in stable  position. Again no definite pneumothorax identified. 2.  Prior cardiac valve replacement.  Heart size stable. 3. Stable bibasilar atelectasis/infiltrates and small bilateral pleural effusions. Electronically Signed   By: Marcello Moores  Register   On: 09/17/2018 06:57    Procedures .Critical Care Performed by: Joanne Gavel, PA-C Authorized by: Joanne Gavel, PA-C   Critical care provider statement:    Critical care time (minutes):  35   Critical care time was exclusive of:  Separately billable procedures and treating other patients and teaching time   Critical care was necessary to treat or prevent imminent or life-threatening deterioration of the following conditions:  Respiratory failure   Critical care was time spent personally by me on the following activities:  Ordering and review of radiographic studies, ordering and review of laboratory studies, pulse oximetry, review of old charts, obtaining history from patient or surrogate and development of treatment plan with patient or surrogate   I assumed direction of critical care for this patient from another provider in my specialty: no     (including critical care time)  Medications Ordered in ED Medications  amLODipine (NORVASC) tablet 10 mg (10 mg Oral Given 09/17/18 0830)  cholecalciferol (VITAMIN D) tablet 1,000 Units (1,000 Units Oral Given 09/16/18 2335)  aspirin EC tablet 81 mg (81 mg Oral Given 09/16/18 2337)  acetaminophen (TYLENOL) tablet 500 mg (500 mg Oral Given 09/16/18 1700)  albuterol (PROVENTIL) (2.5 MG/3ML) 0.083% nebulizer solution 2.5 mg (2.5 mg Nebulization Given 09/08/18 1947)  sertraline (ZOLOFT) tablet 100 mg (100 mg Oral Given 09/17/18 0829)  buPROPion (WELLBUTRIN XL) 24 hr tablet 150 mg (150 mg Oral Given 09/17/18 0830)  ferrous sulfate tablet 325 mg (325 mg Oral Given 09/17/18 0829)  pravastatin (PRAVACHOL) tablet 20 mg (20 mg Oral Given 09/16/18 2335)  umeclidinium-vilanterol (ANORO ELLIPTA) 62.5-25 MCG/INH 1  puff (1 puff Inhalation Not Given 09/17/18 0733)  budesonide (PULMICORT) nebulizer solution 0.5 mg (0.5 mg Nebulization Given 09/17/18 0733)  guaiFENesin (ROBITUSSIN) 100  MG/5ML solution 100 mg (100 mg Oral Given 09/11/18 0647)  enoxaparin (LOVENOX) injection 30 mg (30 mg Subcutaneous Given 09/16/18 2337)  hydrALAZINE (APRESOLINE) injection 10 mg (10 mg Intravenous Given 09/08/18 1759)  antiseptic oral rinse (BIOTENE) solution 15 mL (has no administration in time range)  feeding supplement (GLUCERNA SHAKE) (GLUCERNA SHAKE) liquid 237 mL (237 mLs Oral Given 09/17/18 0831)  dextromethorphan-guaiFENesin (Sunflower DM) 30-600 MG per 12 hr tablet 1 tablet (1 tablet Oral Given 09/17/18 0831)  benzonatate (TESSALON) capsule 100 mg (100 mg Oral Given 09/17/18 1430)  menthol-cetylpyridinium (CEPACOL) lozenge 3 mg (has no administration in time range)  insulin aspart (novoLOG) injection 0-5 Units (2 Units Subcutaneous Given 09/16/18 2350)  oxyCODONE-acetaminophen (PERCOCET/ROXICET) 5-325 MG per tablet 1 tablet (1 tablet Oral Given 09/17/18 1431)    And  oxyCODONE (Oxy IR/ROXICODONE) immediate release tablet 5 mg (5 mg Oral Given 09/17/18 0627)  cloNIDine (CATAPRES) tablet 0.1 mg (0.1 mg Oral Given 09/17/18 1430)  hydrALAZINE (APRESOLINE) 20 MG/ML injection (  Not Given 09/11/18 0901)  insulin aspart (novoLOG) injection 0-9 Units (2 Units Subcutaneous Given 09/17/18 1241)  insulin glargine (LANTUS) injection 10 Units (10 Units Subcutaneous Given 09/17/18 0833)  hydrALAZINE (APRESOLINE) tablet 10 mg (10 mg Oral Given 09/17/18 1430)  traZODone (DESYREL) tablet 25 mg (25 mg Oral Given 09/14/18 2144)  predniSONE (DELTASONE) tablet 30 mg (has no administration in time range)  morphine 2 MG/ML injection 1 mg (has no administration in time range)  cefTRIAXone (ROCEPHIN) 1 g in sodium chloride 0.9 % 100 mL IVPB (0 g Intravenous Stopped 09/05/18 1953)  azithromycin (ZITHROMAX) 500 mg in sodium chloride 0.9 % 250 mL  IVPB (0 mg Intravenous Stopped 09/05/18 2012)  potassium chloride 10 mEq in 100 mL IVPB (0 mEq Intravenous Stopped 09/05/18 1953)  sodium chloride 0.9 % bolus 1,000 mL (0 mLs Intravenous Stopped 09/06/18 1905)  potassium chloride 10 mEq in 100 mL IVPB (0 mEq Intravenous Stopped 09/06/18 1900)  fentaNYL (SUBLIMAZE) injection 50 mcg (50 mcg Intravenous Given 09/08/18 1151)  midazolam (VERSED) injection 1 mg (1 mg Intravenous Given 09/08/18 1151)  hydrALAZINE (APRESOLINE) injection 10 mg (10 mg Intravenous Given 09/11/18 0734)  sodium polystyrene (KAYEXALATE) 15 GM/60ML suspension 30 g (30 g Oral Given 09/13/18 0732)     Initial Impression / Assessment and Plan / ED Course  I have reviewed the triage vital signs and the nursing notes.  Pertinent labs & imaging results that were available during my care of the patient were reviewed by me and considered in my medical decision making (see chart for details).     84 year old female with a history of aortic stenosis, emphysema, GERD, hyperlipidemia, HTN, and asthma presenting with worsening dyspnea and productive cough for the last few days.  No constitutional symptoms.  Since her symptoms have worsened, she states that she has been feeling little bit more confused and "wobbly".  No falls, dizziness, lightheadedness.  Labs and chest x-ray are pending.  Albuterol nebulizer given in the ED. Low suspicion for CVA, ACS, or acute heart failure at this time.  Suspect pneumonia versus COPD exacerbation with hypercarbia versus asthma exacerbation.  Given the patient's initial presentation, I anticipate likely admission.  Patient care transferred to PA Henderly at the end of my shift. Patient presentation, ED course, and plan of care discussed with review of all pertinent labs and imaging. Please see his/her note for further details regarding further ED course and disposition.  Final Clinical Impressions(s) / ED Diagnoses   Final diagnoses:  Community  acquired pneumonia of left lower lobe of lung [facility])    ED Discharge Orders    None       Joanne Gavel, PA-C 09/05/18 1658    Duffy Bruce, MD 09/07/18 0513    Joanne Gavel, PA-C 09/17/18 1604    Duffy Bruce, MD 09/18/18 [phone]

## 2018-09-05 NOTE — ED Provider Notes (Signed)
Medical screening examination/treatment/procedure(s) were conducted as a shared visit with non-physician practitioner(s) and myself.  I personally evaluated the patient during the encounter.  EKG Interpretation  Date/Time:  Thursday September 05 2018 14:21:15 EST Ventricular Rate:  79 PR Interval:    QRS Duration: 144 QT Interval:  438 QTC Calculation: 503 R Axis:   -94 Text Interpretation:  Sinus rhythm Probable left atrial enlargement RBBB and LAFB Baseline wander in lead(s) V3 No significant change since last tracing Confirmed by Fredia Sorrow [phone]) on 09/05/2018 5:51:08 PM  Patient seen by me along with the physician assistant.  Patient was turned over to [place] at shift change.  Patient was sent in from her primary care provider to rule out pneumonia.  Patient's had a 2 to 3-day history of productive cough and shortness of breath.  Normally on 2 L of oxygen at home.  Here requiring 3.  Reports not taking any nebulizer treatment like she should.  Patient's past medical history is never hypertension diabetes and emphysema of the lungs.  She had no pneumonia in 2017 and 2018.  Patient's i-STAT venous blood gas without any significant abnormalities.  She does have a leukocytosis with white count of 12.9.  Has increased oxygen requirement.  Chest x-ray consistent with pneumonia.  Significantly increased left lower lobe pneumonia.  Patient had not been on antibiotics until now.  Also has some mild changes of COPD.  Patient will be started on antibiotics and will require admission.  Patient's alert and cooperative no wheezing bilaterally on lung sounds.  Abdomen soft.  Patient satting in the low 90s on 3 L of oxygen.   Fredia Sorrow, MD 09/05/18 [phone]

## 2018-09-05 NOTE — H&P (Signed)
History and Physical   [name] VPX:[id number] DOB: [birth year] DOA: 09/05/2018  Referring MD/NP/PA: Dr. Rogene Houston  PCP: Harlan Stains, MD   Outpatient Specialists: Dr. Chase Caller  Patient coming from: Home  Chief Complaint: Shortness of breath  HPI: [name] is a 84 y.o. female with medical history significant of COPD being followed by pulmonology on chronic oxygen at 2 L/h at home, hypertension, diabetes, aortic stenosis, hyperlipidemia, irritable bowel syndrome who presents to the ER with worsening shortness of breath and cough. She reported some fever and mild chills.  Patient also has had problem with her home oxygen which was normally at 2 L but now she has developed to 4 L.  She is still on 4 L in the ER.  She is used her nebulizer and other treatments at home with no results so she came to the emergency room.  Denied any chest pain.  She is denied any PND orthopnea.  Denied any sick contacts.  She is having yellow sputum.  Patient was seen in the ER and now found to have left lower lobe pneumonia.  She additionally has hypokalemia among other things.  ED Course: Temperature is 99.2 with blood pressure 183/75 pulse 78 respirate of 27 and oxygen sat 89% on 2 L.  Review of Systems: As per HPI otherwise 10 point review of systems negative.  pH is 7.381.  Electrolytes showed BUN of 31 and creatinine of 1.35 potassium 3.1.  White count is 12.9 otherwise no left shift.  Glucose is 220.  Chest x-ray showed left lower lobe pneumonia with left pleural effusion and another pneumonia on the right lung base.  Past Medical History:  Diagnosis Date  . Anxiety   . Aortic stenosis   . Arthritis   . Asthma    uses inhaler as needed  . Depression   . Diabetes mellitus   . Emphysema of lung ([place])   . GERD (gastroesophageal reflux disease)   . Glaucoma   . Hyperlipidemia   . Hypertension   . [name]   . Murmur   . Nephrolithiasis   . Pneumonia 3/17 and 3/18      Past Surgical History:  Procedure Laterality Date  . ABDOMINAL HYSTERECTOMY    . ANKLE SURGERY  1998   d/t fx.  Left ankle  . ANTERIOR AND POSTERIOR REPAIR  06/21/2011   Procedure: ANTERIOR (CYSTOCELE) AND POSTERIOR REPAIR (RECTOCELE);  Surgeon: Maisie Fus;  Location: [facility];  Service: Gynecology;  Laterality: N/A;  . AORTIC VALVE REPLACEMENT N/A 07/17/2013   Procedure: AORTIC VALVE REPLACEMENT (AVR);  Surgeon: Ivin Poot, MD;  Location: [facility];  Service: Open Heart Surgery;  Laterality: N/A;  . CARDIAC CATHETERIZATION    . CARDIAC VALVE REPLACEMENT    . CORONARY ANGIOPLASTY    . CORONARY ARTERY BYPASS GRAFT N/A 07/17/2013   Procedure: CORONARY ARTERY BYPASS GRAFT, TIMES ONE, ON PUMP, USING RIGHT INTERNAL MAMMARY ARTERY.;  Surgeon: Ivin Poot, MD;  Location: [facility];  Service: Open Heart Surgery;  Laterality: N/A;  . FACIAL COSMETIC SURGERY     face lift  . INTRAOPERATIVE TRANSESOPHAGEAL ECHOCARDIOGRAM N/A 07/17/2013   Procedure: INTRAOPERATIVE TRANSESOPHAGEAL ECHOCARDIOGRAM;  Surgeon: Ivin Poot, MD;  Location: [facility];  Service: Open Heart Surgery;  Laterality: N/A;  . LAPAROSCOPIC ASSISTED VAGINAL HYSTERECTOMY  06/21/2011   Procedure: LAPAROSCOPIC ASSISTED VAGINAL HYSTERECTOMY;  Surgeon: Maisie Fus;  Location: [facility];  Service: Gynecology;  Laterality: N/A;  . LEFT AND  RIGHT HEART CATHETERIZATION WITH CORONARY ANGIOGRAM N/A 06/27/2013   Procedure: LEFT AND RIGHT HEART CATHETERIZATION WITH CORONARY ANGIOGRAM;  Surgeon: Ramond Dial, MD;  Location: [facility] CATH [facility];  Service: Cardiovascular;  Laterality: N/A;  . NOSE SURGERY    . SALPINGOOPHORECTOMY  06/21/2011   Procedure: SALPINGO OOPHERECTOMY;  Surgeon: Maisie Fus;  Location: [facility];  Service: Gynecology;  Laterality: Bilateral;  . VAGINAL PROLAPSE REPAIR  06/21/2011   Procedure: SACROPEXY;  Surgeon: Maisie Fus;  Location: [facility];  Service: Gynecology;  Laterality: N/A;  sacrospinous ligament suspension      reports that she quit smoking about 45 years ago. Her smoking use included cigarettes. She has a 30.00 pack-year smoking history. She has never used smokeless tobacco. She reports that she does not drink alcohol or use drugs.  Allergies  Allergen Reactions  . Lipitor [Atorvastatin] Other (See Comments)    myalgias myalgias  . Lisinopril Other (See Comments)    cough cough  . Amaryl [Glimepiride]     Side effects  . Anoro Ellipta [Umeclidinium-Vilanterol]     Increase BP  . Avelox [Moxifloxacin Hcl In Nacl]     nausea  . Ciprofloxacin     nausea  . Other Other (See Comments)    Increased cough  . Prevacid [Lansoprazole]     diarrhea  . Spiriva Handihaler [Tiotropium Bromide Monohydrate]     Increased cough  . Symbicort [Budesonide-Formoterol Fumarate] Cough    Increased cough    Family History  Problem Relation Age of Onset  . Emphysema Father   . Colon cancer Neg Hx   . Esophageal cancer Neg Hx   . Rectal cancer Neg Hx   . Stomach cancer Neg Hx      Prior to Admission medications   Medication Sig Start Date End Date Taking? Authorizing Provider  acetaminophen (TYLENOL) 500 MG tablet Take 500 mg by mouth every 6 (six) hours as needed for headache.    [provider]  albuterol (PROVENTIL) (2.5 MG/3ML) 0.083% nebulizer solution Take 3 mLs (2.5 mg total) by nebulization every 6 (six) hours as needed for wheezing. 11/23/16   Eugenie Filler, MD  alendronate (FOSAMAX) 70 MG tablet Take 70 mg by mouth once a week. Take with a full glass of water on an empty stomach.    [provider]  amLODipine (NORVASC) 10 MG tablet Take 1 tablet (10 mg total) by mouth daily. 10/19/15   Lelon Perla, MD  ANORO ELLIPTA 62.5-25 MCG/INH AEPB USE 1 INHALATION BY MOUTH  DAILY Patient not taking: Reported on 08/08/2018 03/27/18   Brand Males, MD  aspirin EC 81 MG tablet Take 81 mg by mouth at bedtime.    [provider]  buPROPion (WELLBUTRIN XL) 150 MG 24 hr  tablet Take 150 mg by mouth daily. 06/04/17   [provider]  cholecalciferol (VITAMIN D) 1000 units tablet Take 1,000 Units by mouth at bedtime.     [provider]  cloNIDine (CATAPRES) 0.1 MG tablet Take 0.1 mg by mouth 2 (two) times daily. 06/25/18   [provider]  Docusate Sodium (COLACE PO) Take by mouth 2 (two) times daily.    [provider]  Ferrous Sulfate (IRON) 325 (65 Fe) MG TABS Take by mouth 2 (two) times daily.    [provider]  fluticasone (FLOVENT HFA) 220 MCG/ACT inhaler Inhale 2 puffs into the lungs 2 (two) times daily.    [provider]  furosemide (LASIX)  40 MG tablet Take 0.5 tablets (20 mg total) by mouth every other day. 10/29/17   Hongalgi, Lenis Dickinson, MD  irbesartan (AVAPRO) 150 MG tablet Take 1 tablet (150 mg total) by mouth daily. 10/12/14   Lelon Perla, MD  Magnesium 250 MG TABS Take 250 mg by mouth at bedtime.     [provider]  Multiple Vitamins-Minerals ([place]) Take by mouth.    [provider]  Multiple Vitamins-Minerals (OCUVITE-LUTEIN PO) Take 1 tablet by mouth every morning.     [provider]  pravastatin (PRAVACHOL) 20 MG tablet Take 20 mg by mouth daily.    [provider]  predniSONE (DELTASONE) 10 MG tablet Take 2 tabs x 5 days Patient not taking: Reported on 08/08/2018 07/08/18   Martyn Ehrich, NP  PROAIR HFA 108 (90 BASE) MCG/ACT inhaler Inhale 2 puffs into the lungs 4 (four) times daily as needed for wheezing or shortness of breath.  02/24/14   [provider]  sertraline (ZOLOFT) 100 MG tablet Take 1 tablet (100 mg total) by mouth every morning. 11/24/16   Eugenie Filler, MD  Tiotropium Bromide-Olodaterol (STIOLTO RESPIMAT) 2.5-2.5 MCG/ACT AERS Inhale 2 puffs into the lungs daily. 08/14/18   Brand Males, MD    Physical Exam: Vitals:   09/05/18 1800 09/05/18 1815 09/05/18 1830 09/05/18 1845  BP: (!) 166/68 (!)  179/75 (!) 174/87 (!) 183/72  Pulse: 74 75 76 78  Resp: (!) 26 (!) 27 (!) 23 (!) 24  Temp:      TempSrc:      SpO2: 94% 93% 97% 93%  Weight:      Height:          Constitutional: NAD, calm, comfortable Vitals:   09/05/18 1800 09/05/18 1815 09/05/18 1830 09/05/18 1845  BP: (!) 166/68 (!) 179/75 (!) 174/87 (!) 183/72  Pulse: 74 75 76 78  Resp: (!) 26 (!) 27 (!) 23 (!) 24  Temp:      TempSrc:      SpO2: 94% 93% 97% 93%  Weight:      Height:       Eyes: PERRL, lids and conjunctivae normal ENMT: Mucous membranes are moist. Posterior pharynx clear of any exudate or lesions.Normal dentition.  Neck: normal, supple, no masses, no thyromegaly Respiratory: Increased respiratory effort, decreased air entry bilaterally, marked crackles and wheezing,.  Cardiovascular: Regular rate and rhythm, no murmurs / rubs / gallops. No extremity edema. 2+ pedal pulses. No carotid bruits.  Abdomen: no tenderness, no masses palpated. No hepatosplenomegaly. Bowel sounds positive.  Musculoskeletal: no clubbing / cyanosis. No joint deformity upper and lower extremities. Good ROM, no contractures. Normal muscle tone.  Skin: no rashes, lesions, ulcers. No induration Neurologic: CN 2-12 grossly intact. Sensation intact, DTR normal. Strength 5/5 in all 4.  Psychiatric: Normal judgment and insight. Alert and oriented x 3. Normal mood.     Labs on Admission: I have personally reviewed following labs and imaging studies  CBC: Recent Labs  Lab 09/05/18 1449  WBC 12.9*  NEUTROABS 9.9*  HGB 12.5  HCT 40.4  MCV 91.6  PLT 301   Basic Metabolic Panel: Recent Labs  Lab 09/05/18 1449  NA 139  K 3.1*  CL 105  CO2 22  GLUCOSE 220*  BUN 31*  CREATININE 1.35*  CALCIUM 8.9   GFR: Estimated Creatinine Clearance: 22.6 mL/min (A) (by C-G formula based on SCr of 1.35 mg/dL (H)). Liver Function Tests: No results for input(s):  AST, ALT, ALKPHOS, BILITOT, PROT, ALBUMIN in the last 168 hours. No results for  input(s): LIPASE, AMYLASE in the last 168 hours. No results for input(s): AMMONIA in the last 168 hours. Coagulation Profile: No results for input(s): INR, PROTIME in the last 168 hours. Cardiac Enzymes: No results for input(s): CKTOTAL, CKMB, CKMBINDEX, TROPONINI in the last 168 hours. BNP (last 3 results) No results for input(s): PROBNP in the last 8760 hours. HbA1C: No results for input(s): HGBA1C in the last 72 hours. CBG: No results for input(s): GLUCAP in the last 168 hours. Lipid Profile: No results for input(s): CHOL, HDL, LDLCALC, TRIG, CHOLHDL, LDLDIRECT in the last 72 hours. Thyroid Function Tests: No results for input(s): TSH, T4TOTAL, FREET4, T3FREE, THYROIDAB in the last 72 hours. Anemia Panel: No results for input(s): VITAMINB12, FOLATE, FERRITIN, TIBC, IRON, RETICCTPCT in the last 72 hours. Urine analysis:    Component Value Date/Time   COLORURINE YELLOW 03/02/2017 2156   APPEARANCEUR HAZY (A) 03/02/2017 2156   LABSPEC 1.014 03/02/2017 2156   PHURINE 5.0 03/02/2017 2156   GLUCOSEU NEGATIVE 03/02/2017 2156   HGBUR NEGATIVE 03/02/2017 2156   BILIRUBINUR NEGATIVE 03/02/2017 2156   KETONESUR NEGATIVE 03/02/2017 2156   PROTEINUR 30 (A) 03/02/2017 2156   UROBILINOGEN 0.2 07/11/2013 1416   NITRITE NEGATIVE 03/02/2017 2156   LEUKOCYTESUR SMALL (A) 03/02/2017 2156   Sepsis Labs: @LABRCNTIP (procalcitonin:4,lacticidven:4) )No results found for this or any previous visit (from the past 240 hour(s)).   Radiological Exams on Admission: Dg Chest 2 View  Result Date: 09/05/2018 CLINICAL DATA:  Productive cough and shortness of breath for the past 2-3 days. Clinical stage I non-small cell lung cancer in the left lower lobe. EXAM: CHEST - 2 VIEW COMPARISON:  Chest radiographs dated 07/01/2018. Chest CT dated 08/28/2018. FINDINGS: Normal sized heart. Significantly increase left lower airspace opacity. Small left pleural effusion, increased. Stable hyperexpansion of lungs and  mild patchy opacity at the right lung base. Stable mild right pleural thickening. Mild thoracic spine degenerative changes. IMPRESSION: 1. Significantly increased left lower lobe pneumonia. 2. Small left pleural effusion, increased. 3. Stable mild pneumonia or postradiation changes at the right lung base. 4. Stable mild changes of COPD. Electronically Signed   By: Claudie Revering M.D.   On: 09/05/2018 16:15    EKG: Independently reviewed.  It shows normal sinus rhythm with a rate of 69, left bundle branch and right bundle branch block.  Unchanged from previous  Assessment/Plan Principal Problem:   Acute on chronic respiratory failure with hypoxia (HCC) Active Problems:   Essential hypertension   S/P CABG x 1   Recurrent pneumonia   Pulmonary HTN (HCC)   COPD with acute exacerbation (HCC)   Pneumonia     #1 acute on chronic respiratory failure: Secondary to COPD as well as pneumonia.  Patient is having significant hypoxia.  Mild wheeze however.  We will initiate patient on IV Rocephin and Zithromax.  Obtain blood and sputum cultures.  Continue with oxygenation as well as steroids.  #2 community-acquired pneumonia: Patient will be maintained on antibiotics as above.  Follow resolution of symptoms.  Patient has not been in the hospital in the last 3 months so we will treat as community-acquired pneumonia  #3 COPD: Mild exacerbation.  Initiate IV steroids with antibiotics as above.  Oxygen now at 4 L.  Treat and titrate patient down to her regular 2 L  #4 pulmonary hypertension: Secondary to advanced COPD.  Follows up with pulmonology.  #5 coronary artery disease:  Status post coronary artery bypass graft in the past.  Stable.  #6 hyperlipidemia: Continue statin.  #7 depression: Continue Wellbutrin.  #8 essential hypertension: Continue blood pressure control.  Home regiment of amlodipine with clonidine and Avapro.   DVT prophylaxis: Lovenox Code Status: Full code Family Communication:  Husband Disposition Plan: Home Consults called: None Admission status: Inpatient  Severity of Illness: The appropriate patient status for this patient is INPATIENT. Inpatient status is judged to be reasonable and necessary in order to provide the required intensity of service to ensure the patient's safety. The patient's presenting symptoms, physical exam findings, and initial radiographic and laboratory data in the context of their chronic comorbidities is felt to place them at high risk for further clinical deterioration. Furthermore, it is not anticipated that the patient will be medically stable for discharge from the hospital within 2 midnights of admission. The following factors support the patient status of inpatient.   " The patient's presenting symptoms include shortness of breath. " The worrisome physical exam findings include expiratory wheezing. " The initial radiographic and laboratory data are worrisome because of chest x-ray showing left but also right lower lobe pneumonia. " The chronic co-morbidities include COPD.   * I certify that at the point of admission it is my clinical judgment that the patient will require inpatient hospital care spanning beyond 2 midnights from the point of admission due to high intensity of service, high risk for further deterioration and high frequency of surveillance required.Barbette Merino MD [facility] Pager [phone]  If 7PM-7AM, please contact night-coverage [email or web address] Password [facility]  09/05/2018, 7:47 PM

## 2018-09-05 NOTE — ED Notes (Signed)
During exertion (sitting on bedside commode), pt's O2 dropped. Informed Sarah - RN. Pt will be placed on a Purewick.

## 2018-09-05 NOTE — ED Notes (Signed)
Pt refused the Purewick at this time. Pt wants to be placed on a Purewick later.

## 2018-09-05 NOTE — ED Provider Notes (Signed)
Patient care transferred at shift change from [facility], PA-C. See note for full history.  In summation, 84 year old with history of COPD who presents for evaluation of cough and SOB x 3 days.   Patient does use home oxygen at 2L for COPD. Has used her home nebulizer at 3x/day. Denies lower extremity swelling, Flu like symptoms, PND, orthopnea, fever, chills, body aches.  Cough productive of yellow phlegm.  Patient was seen in triage her oxygen saturations were 65% on her home 2 L of oxygen.  Oxygen increased to 4 L with oxygen saturations increasing to 93%.  She has not had any recent hospitalizations.  He did have hospitalizations in 2017 and 2018 for pneumonia.  DDX: COPD exacerbation vs pneumonia, CBC with leukocytosis at 12.9, hypokalemia 3.1, elevated creatinine 1.35, last creatinine 1.38 10 months ago.  At patient's baseline, mildly elevated glucose at 220.  Patient was initially hypoxic on initial evaluation for triage.  She was placed on 4 L of oxygen with oxygen saturations increased to 93%.  Troponin negative.  Patient will likely require admission for possible pneumonia vs. COPD exacerbation, Pending blood gas, EKG, chest x-ray pending at care transfer.   Physical Exam  BP (!) 183/75   Pulse 78   Temp 99.2 F (37.3 C) (Oral)   Resp (!) 27   Ht 5' (1.524 m)   Wt 45.4 kg   SpO2 (!) 89%   BMI 19.53 kg/m   Physical Exam Vitals signs and nursing note reviewed.  Constitutional:      General: She is not in acute distress.    Comments: Chronically ill appearing  HENT:     Head: Atraumatic.  Eyes:     Pupils: Pupils are equal, round, and reactive to light.  Neck:     Musculoskeletal: Normal range of motion.  Cardiovascular:     Rate and Rhythm: Normal rate.     Pulses: Normal pulses.     Heart sounds: Normal heart sounds. No murmur. No friction rub. No gallop.   Pulmonary:     Effort: No tachypnea, accessory muscle usage or respiratory distress.     Breath sounds:  Examination of the right-upper field reveals rhonchi. Examination of the left-upper field reveals rhonchi. Examination of the right-middle field reveals rhonchi. Examination of the left-middle field reveals rhonchi. Examination of the left-lower field reveals decreased breath sounds. Decreased breath sounds and rhonchi present. No wheezing or rales.  Abdominal:     General: There is no distension.  Musculoskeletal: Normal range of motion.     Right lower leg: No edema.     Left lower leg: No edema.     Comments: Mild lower extremity swelling.  Moves all extremities without difficulty.  Skin:    General: Skin is warm and dry.  Neurological:     General: No focal deficit present.     Mental Status: She is alert.     Cranial Nerves: Cranial nerves are intact. No cranial nerve deficit, dysarthria or facial asymmetry.     Motor: Motor function is intact. No weakness or pronator drift.     Coordination: Coordination is intact. Romberg sign negative.     Comments: Symmetrical smile, bilateral strength 5/5 to upper and lower extremities.  Negative finger-to-nose.  Normal heel-to-shin.     ED Course/Procedures     Procedures  MDM  84 year old female with history of COPD presents via PCP office for evaluation of cough and shortness of breath.  Increasing over the last  3 days.  Does use home oxygen of 2 L.  Patiently was hypoxic on arrival, she was placed on 4 L of oxygen nasal cannula with oxygen saturations to 93%. Leukocytosis at 12.9, glucose 220, creatinine 1.35.  Troponin negative.  Blood gas, EKG, chest x-ray pending at care transfer.  Patient will likely need admission for COPD exacerbation.  Will reevaluate.  EKG sinus rhythm without signs of acute ischemia, chest x-ray lower lobe infiltrate increased since CT scan on 08/28/2018.  I-STAT blood gas without any significant abnormalities.  Patient has not been on outpatient antibiotics.  Afebrile, nonseptic appearing.  Patient does not meet  Sirs or sepsis criteria.  She does not appear in any respiratory distress.  Able to speak in full sentences without difficulty.  Patient with increase in baseline home oxygen from 2 L to 4 L.  Patient oxygen saturations 93% on 4 L of oxygen via nasal cannula.  Patient admits dyspnea with exertion onset of symptoms.  Patient with diffuse rhonchi on exam.  No wheezing, patient did receive albuterol treatment prior to my care.  No lower extremity swelling or pulmonary edema on chest x-ray to suggest CHF exacerbation.  Low suspicion for ACS, PE causing symptoms.  Non- focal neurologic exam without neurologic deficits.  Patient likely with community-acquired pneumonia.  Abdomen soft, nontender without rebound or guarding.  Patient moves all extremities without difficulty.  Will give Rocephin and azithromycin.  Curb-65 criteria  for pneumonia place patient is moderate risk group.  Will consult hospitalist for admission for pneumonia.  1815: Dr. Jonelle Sidle with [facility] agrees for admission for pneumonia  Patient has been seen and personally evaluated by my attending Dr. Rogene Houston who agrees with above, treatment, plan and disposition.      Demetrio Leighty A, PA-C 09/05/18 2016    Fredia Sorrow, MD 09/06/18 2328

## 2018-09-05 NOTE — ED Triage Notes (Signed)
Pt presents from PCP to r/o pneumonia.  Pt reports 2-3 day h/o productive cough and shortness of breath.  Pt is on home O2 at 2 liters, reports not taking neb treatment "like I should".

## 2018-09-06 ENCOUNTER — Inpatient Hospital Stay (HOSPITAL_COMMUNITY): Payer: Medicare Other

## 2018-09-06 DIAGNOSIS — E43 Unspecified severe protein-calorie malnutrition: Secondary | ICD-10-CM

## 2018-09-06 LAB — GLUCOSE, CAPILLARY
GLUCOSE-CAPILLARY: 233 mg/dL — AB (ref 70–99)
Glucose-Capillary: 155 mg/dL — ABNORMAL HIGH (ref 70–99)
Glucose-Capillary: 160 mg/dL — ABNORMAL HIGH (ref 70–99)
Glucose-Capillary: 161 mg/dL — ABNORMAL HIGH (ref 70–99)
Glucose-Capillary: 165 mg/dL — ABNORMAL HIGH (ref 70–99)
Glucose-Capillary: 165 mg/dL — ABNORMAL HIGH (ref 70–99)
Glucose-Capillary: 179 mg/dL — ABNORMAL HIGH (ref 70–99)
Glucose-Capillary: 196 mg/dL — ABNORMAL HIGH (ref 70–99)
Glucose-Capillary: 284 mg/dL — ABNORMAL HIGH (ref 70–99)
Glucose-Capillary: 331 mg/dL — ABNORMAL HIGH (ref 70–99)
Glucose-Capillary: 389 mg/dL — ABNORMAL HIGH (ref 70–99)

## 2018-09-06 LAB — COMPREHENSIVE METABOLIC PANEL
ALT: 39 U/L (ref 0–44)
AST: 49 U/L — ABNORMAL HIGH (ref 15–41)
Albumin: 3.1 g/dL — ABNORMAL LOW (ref 3.5–5.0)
Alkaline Phosphatase: 75 U/L (ref 38–126)
Anion gap: 16 — ABNORMAL HIGH (ref 5–15)
BUN: 30 mg/dL — ABNORMAL HIGH (ref 8–23)
CO2: 19 mmol/L — ABNORMAL LOW (ref 22–32)
Calcium: 8.8 mg/dL — ABNORMAL LOW (ref 8.9–10.3)
Chloride: 106 mmol/L (ref 98–111)
Creatinine, Ser: 1.38 mg/dL — ABNORMAL HIGH (ref 0.44–1.00)
GFR calc Af Amer: 41 mL/min — ABNORMAL LOW (ref >60)
GFR calc non Af Amer: 35 mL/min — ABNORMAL LOW (ref >60)
Glucose, Bld: 232 mg/dL — ABNORMAL HIGH (ref 70–99)
Potassium: 3.6 mmol/L (ref 3.5–5.1)
Sodium: 141 mmol/L (ref 135–145)
Total Bilirubin: 0.9 mg/dL (ref 0.3–1.2)
Total Protein: 6.5 g/dL (ref 6.5–8.1)

## 2018-09-06 LAB — BASIC METABOLIC PANEL
Anion gap: 14 (ref 5–15)
Anion gap: 12 (ref 5–15)
Anion gap: 12 (ref 5–15)
BUN: 37 mg/dL — ABNORMAL HIGH (ref 8–23)
BUN: 40 mg/dL — ABNORMAL HIGH (ref 8–23)
BUN: 46 mg/dL — ABNORMAL HIGH (ref 8–23)
CALCIUM: 8.5 mg/dL — AB (ref 8.9–10.3)
CALCIUM: 8.6 mg/dL — AB (ref 8.9–10.3)
CO2: 19 mmol/L — ABNORMAL LOW (ref 22–32)
CO2: 19 mmol/L — ABNORMAL LOW (ref 22–32)
CO2: 18 mmol/L — AB (ref 22–32)
Calcium: 8.4 mg/dL — ABNORMAL LOW (ref 8.9–10.3)
Chloride: 107 mmol/L (ref 98–111)
Chloride: 106 mmol/L (ref 98–111)
Chloride: 108 mmol/L (ref 98–111)
Creatinine, Ser: 1.16 mg/dL — ABNORMAL HIGH (ref 0.44–1.00)
Creatinine, Ser: 1.39 mg/dL — ABNORMAL HIGH (ref 0.44–1.00)
Creatinine, Ser: 1.52 mg/dL — ABNORMAL HIGH (ref 0.44–1.00)
GFR calc Af Amer: 50 mL/min — ABNORMAL LOW (ref >60)
GFR calc Af Amer: 41 mL/min — ABNORMAL LOW (ref >60)
GFR calc Af Amer: 36 mL/min — ABNORMAL LOW (ref >60)
GFR calc non Af Amer: 35 mL/min — ABNORMAL LOW (ref >60)
GFR, EST NON AFRICAN AMERICAN: 44 mL/min — AB (ref >60)
GFR, EST NON AFRICAN AMERICAN: 31 mL/min — AB (ref >60)
GLUCOSE: 253 mg/dL — AB (ref 70–99)
Glucose, Bld: 444 mg/dL — ABNORMAL HIGH (ref 70–99)
Glucose, Bld: 496 mg/dL — ABNORMAL HIGH (ref 70–99)
Potassium: 3.8 mmol/L (ref 3.5–5.1)
Potassium: 3.9 mmol/L (ref 3.5–5.1)
Potassium: 3.8 mmol/L (ref 3.5–5.1)
Sodium: 140 mmol/L (ref 135–145)
Sodium: 137 mmol/L (ref 135–145)
Sodium: 138 mmol/L (ref 135–145)

## 2018-09-06 LAB — CBC
HCT: 37.3 % (ref 36.0–46.0)
Hemoglobin: 11.9 g/dL — ABNORMAL LOW (ref 12.0–15.0)
MCH: 29.3 pg (ref 26.0–34.0)
MCHC: 31.9 g/dL (ref 30.0–36.0)
MCV: 91.9 fL (ref 80.0–100.0)
Platelets: 246 10*3/uL (ref 150–400)
RBC: 4.06 MIL/uL (ref 3.87–5.11)
RDW: 13.4 % (ref 11.5–15.5)
WBC: 13.4 10*3/uL — AB (ref 4.0–10.5)
nRBC: 0 % (ref 0.0–0.2)

## 2018-09-06 LAB — HIV ANTIBODY (ROUTINE TESTING W REFLEX): HIV Screen 4th Generation wRfx: NONREACTIVE

## 2018-09-06 LAB — BETA-HYDROXYBUTYRIC ACID: Beta-Hydroxybutyric Acid: 0.94 mmol/L — ABNORMAL HIGH (ref 0.05–0.27)

## 2018-09-06 LAB — INFLUENZA PANEL BY PCR (TYPE A & B)
Influenza A By PCR: NEGATIVE
Influenza B By PCR: NEGATIVE

## 2018-09-06 LAB — HEMOGLOBIN A1C
Hgb A1c MFr Bld: 7.4 % — ABNORMAL HIGH (ref 4.8–5.6)
Mean Plasma Glucose: 165.68 mg/dL

## 2018-09-06 LAB — LACTIC ACID, PLASMA: LACTIC ACID, VENOUS: 0.9 mmol/L (ref 0.5–1.9)

## 2018-09-06 MED ORDER — PREDNISONE 20 MG PO TABS
50.0000 mg | ORAL_TABLET | Freq: Every day | ORAL | Status: DC
  Administered 2018-09-07 – 2018-09-15 (×9): 50 mg via ORAL
  Filled 2018-09-06 (×8): qty 2

## 2018-09-06 MED ORDER — MENTHOL 3 MG MT LOZG
1.0000 | LOZENGE | OROMUCOSAL | Status: DC | PRN
  Administered 2018-09-19: 3 mg via ORAL
  Filled 2018-09-06: qty 9

## 2018-09-06 MED ORDER — DM-GUAIFENESIN ER 30-600 MG PO TB12
1.0000 | ORAL_TABLET | Freq: Two times a day (BID) | ORAL | Status: DC
  Administered 2018-09-06 – 2018-09-20 (×29): 1 via ORAL
  Filled 2018-09-06 (×29): qty 1

## 2018-09-06 MED ORDER — BENZONATATE 100 MG PO CAPS
100.0000 mg | ORAL_CAPSULE | Freq: Three times a day (TID) | ORAL | Status: DC
  Administered 2018-09-06 – 2018-09-20 (×42): 100 mg via ORAL
  Filled 2018-09-06 (×42): qty 1

## 2018-09-06 MED ORDER — INSULIN ASPART 100 UNIT/ML ~~LOC~~ SOLN
3.0000 [IU] | Freq: Three times a day (TID) | SUBCUTANEOUS | Status: DC
  Administered 2018-09-06: 3 [IU] via SUBCUTANEOUS

## 2018-09-06 MED ORDER — INSULIN ASPART 100 UNIT/ML ~~LOC~~ SOLN
0.0000 [IU] | Freq: Three times a day (TID) | SUBCUTANEOUS | Status: DC
  Administered 2018-09-06: 20 [IU] via SUBCUTANEOUS

## 2018-09-06 MED ORDER — INSULIN ASPART 100 UNIT/ML ~~LOC~~ SOLN: 0.0000 [IU] | Freq: Every day | SUBCUTANEOUS | Status: DC

## 2018-09-06 MED ORDER — ENOXAPARIN SODIUM 30 MG/0.3ML ~~LOC~~ SOLN
30.0000 mg | SUBCUTANEOUS | Status: DC
  Administered 2018-09-06 – 2018-09-19 (×14): 30 mg via SUBCUTANEOUS
  Filled 2018-09-06 (×14): qty 0.3

## 2018-09-06 MED ORDER — DEXTROSE-NACL 5-0.45 % IV SOLN
INTRAVENOUS | Status: DC
  Administered 2018-09-06 – 2018-09-07 (×2): via INTRAVENOUS

## 2018-09-06 MED ORDER — METHYLPREDNISOLONE SODIUM SUCC 40 MG IJ SOLR
40.0000 mg | Freq: Two times a day (BID) | INTRAMUSCULAR | Status: DC
  Administered 2018-09-06: 40 mg via INTRAVENOUS
  Filled 2018-09-06: qty 1

## 2018-09-06 MED ORDER — INSULIN REGULAR(HUMAN) IN NACL 100-0.9 UT/100ML-% IV SOLN
INTRAVENOUS | Status: DC
  Administered 2018-09-06: 2.2 [IU]/h via INTRAVENOUS
  Filled 2018-09-06 (×2): qty 100

## 2018-09-06 MED ORDER — POTASSIUM CHLORIDE 10 MEQ/100ML IV SOLN
10.0000 meq | INTRAVENOUS | Status: AC
  Administered 2018-09-06 (×2): 10 meq via INTRAVENOUS
  Filled 2018-09-06 (×2): qty 100

## 2018-09-06 MED ORDER — SODIUM CHLORIDE 0.9 % IV BOLUS
1000.0000 mL | Freq: Once | INTRAVENOUS | Status: AC
  Administered 2018-09-06: 1000 mL via INTRAVENOUS

## 2018-09-06 MED ORDER — SODIUM CHLORIDE 0.9 % IV SOLN: INTRAVENOUS | Status: DC

## 2018-09-06 MED ORDER — BIOTENE DRY MOUTH MT LIQD: 15.0000 mL | OROMUCOSAL | Status: DC | PRN

## 2018-09-06 MED ORDER — GLUCERNA SHAKE PO LIQD
237.0000 mL | Freq: Three times a day (TID) | ORAL | Status: DC
  Administered 2018-09-06 – 2018-09-20 (×16): 237 mL via ORAL

## 2018-09-06 MED ORDER — HYDRALAZINE HCL 20 MG/ML IJ SOLN
10.0000 mg | INTRAMUSCULAR | Status: DC | PRN
  Administered 2018-09-08: 10 mg via INTRAVENOUS
  Filled 2018-09-06 (×2): qty 1

## 2018-09-06 NOTE — Progress Notes (Signed)
[facility] Progress Note  Patient: [name] NLG:[id number]   PCP: Harlan Stains, MD DOB: 03-04-1932   DOA: 09/05/2018   DOS: 09/06/2018   Date of Service: the patient was seen and examined on 09/06/2018  Brief hospital course: Pt. with PMH of COPD, type II DM, malnutrition; admitted on 09/05/2018, presented with complaint of shortness of breath, was found to have COPD exacerbation with multifocal pneumonia and DKA. Currently further plan is continue current management.  Subjective: Still having shortness of breath, still has significant cough.  No chest pain no abdominal pain.  No nausea no vomiting.  Reports that she has history of diabetes and was actually on metformin and was taken off of it and her sugars have been running high after that.  Assessment and Plan: 1.  Acute on chronic hypoxic respiratory failure. Multifocal pneumonia. COPD exacerbation. Comes with cough and shortness of breath.  Chest x-ray shows left lower lobe pneumonia as well as right lower lobe pneumonia. Started on IV antibiotics, continue IV ceftriaxone, oral azithromycin. Follow-up on blood cultures. Strep pneumo currently pending. Sputum culture pending. Started on IV Solu-Medrol, will transition to oral prednisone. We will repeat another chest x-ray tomorrow.  2.  DKA. Type 2 diabetes mellitus, uncontrolled with hyperglycemia without any complication. Labs this morning showed high anion gap metabolic acidosis. Lactic acid is normal. Beta hydroxybutyrate acid is elevated. Patient is hyperglycemic as well. Does have history of type 2 diabetes mellitus and recently taken off of her metformin. Likely steroid-induced hyperglycemia causing some metabolic acidosis. Patient was given IV fluid bolus as well as subcutaneous insulin with which her blood sugar has not improved. Currently we will transition her to complete DKA treatment protocol with IV insulin, IV fluids, n.p.o., BMP every 4 hours and  CBG 1 hour. Once her anion gap remains closed and sugar remains less than 200 we will consider transitioning her to basal bolus regimen with sliding scale. Hemoglobin A1c 7.4, recommend to resume metformin on discharge.  3.  Accelerated hypertension. Chronic diastolic CHF Blood pressure significantly elevated. Patient is on amlodipine, clonidine, Lasix, Avapro at home. Currently with AKI and DKA holding Lasix and Avapro.  Continue amlodipine and clonidine. Currently not volume overloaded but patient remains at risk for volume overload with aggressive IV hydration for DKA.  Transition to stepdown unit.  4.  Mood disorder. Continue home regimen for now.  5.  Dyslipidemia. Continue Pravachol.  6. Malnutrition Nutrition Problem: Severe Malnutrition Etiology: chronic illness Interventions: Interventions: Glucerna shake Monitor.  Diet: Currently n.p.o., transition to carb modified diet once patient is able to eat.  DVT Prophylaxis: subcutaneous Heparin  Advance goals of care discussion: Full code  Family Communication: family was present at bedside, at the time of interview. The pt provided permission to discuss medical plan with the family. Opportunity was given to ask question and all questions were answered satisfactorily.   Disposition:  Discharge to home likely Sunday with home health.  Consultants: None Procedures: None  Scheduled Meds: . amLODipine  10 mg Oral Daily  . aspirin EC  81 mg Oral QHS  . benzonatate  100 mg Oral TID  . budesonide (PULMICORT) nebulizer solution  0.5 mg Nebulization BID  . buPROPion  150 mg Oral Daily  . cholecalciferol  1,000 Units Oral QHS  . cloNIDine  0.1 mg Oral BID  . dextromethorphan-guaiFENesin  1 tablet Oral BID  . enoxaparin (LOVENOX) injection  30 mg Subcutaneous Q24H  . feeding supplement (GLUCERNA SHAKE)  237 mL  Oral TID BM  . ferrous sulfate  325 mg Oral BID  . magnesium oxide  200 mg Oral Daily  . pravastatin  20 mg Oral QHS    . [START ON 09/07/2018] predniSONE  50 mg Oral Q breakfast  . sertraline  100 mg Oral q morning - 10a  . umeclidinium-vilanterol  1 puff Inhalation Daily   Continuous Infusions: . sodium chloride 75 mL/hr at 09/06/18 1159  . azithromycin    . cefTRIAXone (ROCEPHIN)  IV    . dextrose 5 % and 0.45% NaCl    . insulin    . potassium chloride     PRN Meds: acetaminophen, albuterol, antiseptic oral rinse, guaiFENesin, hydrALAZINE, menthol-cetylpyridinium Antibiotics: Anti-infectives (From admission, onward)   Start     Dose/Rate Route Frequency Ordered Stop   09/06/18 2100  cefTRIAXone (ROCEPHIN) 1 g in sodium chloride 0.9 % 100 mL IVPB     1 g 200 mL/hr over 30 Minutes Intravenous Every 24 hours 09/05/18 2058 09/13/18 2059   09/06/18 2000  azithromycin (ZITHROMAX) 500 mg in sodium chloride 0.9 % 250 mL IVPB     500 mg 250 mL/hr over 60 Minutes Intravenous Every 24 hours 09/05/18 2058 09/13/18 1959   09/05/18 1800  cefTRIAXone (ROCEPHIN) 1 g in sodium chloride 0.9 % 100 mL IVPB     1 g 200 mL/hr over 30 Minutes Intravenous  Once 09/05/18 1748 09/05/18 1953   09/05/18 1800  azithromycin (ZITHROMAX) 500 mg in sodium chloride 0.9 % 250 mL IVPB     500 mg 250 mL/hr over 60 Minutes Intravenous  Once 09/05/18 1748 09/05/18 2012       Objective: Physical Exam: Vitals:   09/06/18 0008 09/06/18 0729 09/06/18 0804 09/06/18 1221  BP: (!) 169/71 (!) 186/69  139/70  Pulse: 73 66  72  Resp: 18 19    Temp: 98.9 F (37.2 C) 98.2 F (36.8 C)    TempSrc: Oral Oral    SpO2: 94% 97% 93% 97%  Weight:      Height:        Intake/Output Summary (Last 24 hours) at 09/06/2018 1447 Last data filed at 09/06/2018 0400 Gross per 24 hour  Intake 937.93 ml  Output -  Net 937.93 ml   Filed Weights   09/05/18 1424  Weight: 45.4 kg   General: Alert, Awake and Oriented to Time, Place and Person. Appear in moderate distress, affect appropriate Eyes: PERRL, Conjunctiva normal ENT: Oral Mucosa clear  moist. Neck: no JVD, no Abnormal Mass Or lumps Cardiovascular: S1 and S2 Present, no Murmur, Peripheral Pulses Present Respiratory: Increased respiratory effort, Bilateral Air entry equal and Decreased, no use of accessory muscle, bilateral  Crackles, bilateral  wheezes Abdomen: Bowel Sound present, Soft and no tenderness, no hernia Skin: no redness, no Rash, no induration Extremities: no Pedal edema, no calf tenderness Neurologic: Grossly no focal neuro deficit. Bilaterally Equal motor strength  Data Reviewed: CBC: Recent Labs  Lab 09/05/18 1449 09/05/18 2114 09/06/18 0228  WBC 12.9* 15.7* 13.4*  NEUTROABS 9.9*  --   --   HGB 12.5 11.8* 11.9*  HCT 40.4 37.4 37.3  MCV 91.6 90.6 91.9  PLT 226 231 413   Basic Metabolic Panel: Recent Labs  Lab 09/05/18 1449 09/05/18 2114 09/06/18 0228 09/06/18 1040 09/06/18 1317  NA 139  --  141 140 137  K 3.1*  --  3.6 3.8 3.9  CL 105  --  106 107 106  CO2 22  --  19* 19* 19*  GLUCOSE 220*  --  232* 444* 496*  BUN 31*  --  30* 37* 40*  CREATININE 1.35* 1.32* 1.38* 1.16* 1.39*  CALCIUM 8.9  --  8.8* 8.4* 8.5*    Liver Function Tests: Recent Labs  Lab 09/06/18 0228  AST 49*  ALT 39  ALKPHOS 75  BILITOT 0.9  PROT 6.5  ALBUMIN 3.1*   No results for input(s): LIPASE, AMYLASE in the last 168 hours. No results for input(s): AMMONIA in the last 168 hours. Coagulation Profile: No results for input(s): INR, PROTIME in the last 168 hours. Cardiac Enzymes: No results for input(s): CKTOTAL, CKMB, CKMBINDEX, TROPONINI in the last 168 hours. BNP (last 3 results) No results for input(s): PROBNP in the last 8760 hours. CBG: Recent Labs  Lab 09/05/18 2103 09/06/18 0727 09/06/18 1144  GLUCAP 148* 233* 389*   Studies: Dg Chest 2 View  Result Date: 09/05/2018 CLINICAL DATA:  Productive cough and shortness of breath for the past 2-3 days. Clinical stage I non-small cell lung cancer in the left lower lobe. EXAM: CHEST - 2 VIEW  COMPARISON:  Chest radiographs dated 07/01/2018. Chest CT dated 08/28/2018. FINDINGS: Normal sized heart. Significantly increase left lower airspace opacity. Small left pleural effusion, increased. Stable hyperexpansion of lungs and mild patchy opacity at the right lung base. Stable mild right pleural thickening. Mild thoracic spine degenerative changes. IMPRESSION: 1. Significantly increased left lower lobe pneumonia. 2. Small left pleural effusion, increased. 3. Stable mild pneumonia or postradiation changes at the right lung base. 4. Stable mild changes of COPD. Electronically Signed   By: Claudie Revering M.D.   On: 09/05/2018 16:15     Time spent: 35 minutes  Author: Berle Mull, MD [facility] Pager: [phone] 09/06/2018 2:47 PM  Between 7PM-7AM, please contact night-coverage at [email or web address], password [facility]

## 2018-09-06 NOTE — Progress Notes (Signed)
Lovenox dose adjustment:  Pt wt>>45kg. Will reduce prophylaxis dose to 30mg  SQ qday.  Onnie Boer, PharmD, BCIDP, AAHIVP, CPP Infectious Disease Pharmacist 09/06/2018 7:59 AM

## 2018-09-06 NOTE — Progress Notes (Signed)
Initial Nutrition Assessment  DOCUMENTATION CODES:   Severe malnutrition in context of chronic illness  INTERVENTION:   - Add Glucerna TID (each provides 220 kcal and 10 g protein) - Continue vitamin D, Mag-Ox, and iron supplements as ordered   NUTRITION DIAGNOSIS:   Severe Malnutrition related to chronic illness as evidenced by severe muscle depletion, severe fat depletion, energy intake < 75% for > or equal to 3 months.   GOAL:   Patient will meet greater than or equal to 90% of their needs   MONITOR:   PO intake, Supplement acceptance, Labs, Weight trends  REASON FOR ASSESSMENT:   Malnutrition Screening Tool    ASSESSMENT:   84 yo female, admitted with acute on chronic respiratory failure with hypoxia. PMH significant for COPD on 2 L/h O2 at home, HTN, DM, HLD, IBS, GERD, former smoker. Home meds include cholecalciferol, Colace, iron 325 mg BID, Lasix, magnesium 250 mg daily, MVI, Pravastatin 20 mg daily.  Labs: glucose 444, BUN 37, Creatinine 1.16 and trending down, AST 49, GFR 44%/50%, Hgb 11.9 Meds: cholecalciferol 1000 U daily, ferrous sulfate 325 mg BID, novolog TID with meals and daily at bedtime, Mag-Ox 200 mg daily, pravastatin 20 mg daily  Pt sitting at edge of bed eating lunch with husband present at time of visit.  Pt reports fair appetite. Typically eats 3 light meals daily; husband encourages her to eat. Does not follow any special diet, aside from avoiding chocolate after lunch.  Describes dry mouth as a result of taking blood pressure medication, which makes it hard to eat and difficult to swallow. Encouraged pt to have drinks with meals, use sour flavors to increase saliva production, and to use gravies and sauces to keep food moist.   Reports stable weight over the last 1-2 years.  Pt denies nausea or vomiting, diarrhea or constipation, or difficulty chewing. Last BM yesterday, per pt.  Encouraged pt to include protein-rich foods with all meals and  snacks, and to eat those foods first if not feeling hungry. Pt amenable to receiving Glucerna TID in chocolate.  NUTRITION - FOCUSED PHYSICAL EXAM:   Most Recent Value  Orbital Region  Moderate depletion  Upper Arm Region  Severe depletion  Thoracic and Lumbar Region  Severe depletion  Buccal Region  Severe depletion  Temple Region  Severe depletion  Clavicle Bone Region  Severe depletion  Clavicle and Acromion Bone Region  Severe depletion  Scapular Bone Region  Severe depletion  Dorsal Hand  Severe depletion  Patellar Region  Severe depletion  Anterior Thigh Region  Severe depletion  Posterior Calf Region  Severe depletion  Edema (RD Assessment)  None  Hair  Other (Comment)  Eyes  Reviewed  Mouth  Reviewed  Skin  Reviewed  Nails  Reviewed      Diet Order:  No intake documented, per nsg documentation Diet Order            Diet Carb Modified Fluid consistency: Thin; Room service appropriate? Yes  Diet effective now             EDUCATION NEEDS:  No education needs have been identified at this time  Skin:  Skin Assessment: Skin Integrity Issues: Skin Integrity Issues:: Other (Comment) Other: ecchymosis BL hands, legs  Last BM:  12/19, per pt  Height:  Ht Readings from Last 1 Encounters:  09/05/18 5' (1.524 m)    Weight:  Wt Readings from Last 1 Encounters:  09/05/18 45.4 kg    Ideal Body  Weight:  45.5 kg  BMI:  Body mass index is 19.53 kg/m.  Estimated Nutritional Needs:   Kcal:  1256 calories daily (MSJ x 1.5)  Protein:  55-68 gm daily (1.2-1.5 g/kg IBW)  Fluid:  >/= 1.2 L daily or per MD discretion  Althea Grimmer, MS, RDN, LDN Pager: [phone]

## 2018-09-07 LAB — CBC
HCT: 31.3 % — ABNORMAL LOW (ref 36.0–46.0)
HEMOGLOBIN: 10.1 g/dL — AB (ref 12.0–15.0)
MCH: 29.4 pg (ref 26.0–34.0)
MCHC: 32.3 g/dL (ref 30.0–36.0)
MCV: 91.3 fL (ref 80.0–100.0)
Platelets: 239 10*3/uL (ref 150–400)
RBC: 3.43 MIL/uL — ABNORMAL LOW (ref 3.87–5.11)
RDW: 13.5 % (ref 11.5–15.5)
WBC: 13.3 10*3/uL — ABNORMAL HIGH (ref 4.0–10.5)
nRBC: 0 % (ref 0.0–0.2)

## 2018-09-07 LAB — GLUCOSE, CAPILLARY
GLUCOSE-CAPILLARY: 145 mg/dL — AB (ref 70–99)
GLUCOSE-CAPILLARY: 233 mg/dL — AB (ref 70–99)
Glucose-Capillary: 128 mg/dL — ABNORMAL HIGH (ref 70–99)
Glucose-Capillary: 130 mg/dL — ABNORMAL HIGH (ref 70–99)
Glucose-Capillary: 156 mg/dL — ABNORMAL HIGH (ref 70–99)
Glucose-Capillary: 163 mg/dL — ABNORMAL HIGH (ref 70–99)
Glucose-Capillary: 186 mg/dL — ABNORMAL HIGH (ref 70–99)
Glucose-Capillary: 190 mg/dL — ABNORMAL HIGH (ref 70–99)
Glucose-Capillary: 256 mg/dL — ABNORMAL HIGH (ref 70–99)
Glucose-Capillary: 168 mg/dL — ABNORMAL HIGH (ref 70–99)

## 2018-09-07 LAB — BASIC METABOLIC PANEL
Anion gap: 10 (ref 5–15)
Anion gap: 10 (ref 5–15)
BUN: 47 mg/dL — ABNORMAL HIGH (ref 8–23)
BUN: 45 mg/dL — ABNORMAL HIGH (ref 8–23)
CO2: 18 mmol/L — ABNORMAL LOW (ref 22–32)
CO2: 22 mmol/L (ref 22–32)
Calcium: 8.5 mg/dL — ABNORMAL LOW (ref 8.9–10.3)
Calcium: 8 mg/dL — ABNORMAL LOW (ref 8.9–10.3)
Chloride: 110 mmol/L (ref 98–111)
Chloride: 108 mmol/L (ref 98–111)
Creatinine, Ser: 1.22 mg/dL — ABNORMAL HIGH (ref 0.44–1.00)
Creatinine, Ser: 1.25 mg/dL — ABNORMAL HIGH (ref 0.44–1.00)
GFR calc Af Amer: 47 mL/min — ABNORMAL LOW (ref >60)
GFR calc Af Amer: 46 mL/min — ABNORMAL LOW (ref >60)
GFR calc non Af Amer: 41 mL/min — ABNORMAL LOW (ref >60)
GFR calc non Af Amer: 40 mL/min — ABNORMAL LOW (ref >60)
Glucose, Bld: 172 mg/dL — ABNORMAL HIGH (ref 70–99)
Glucose, Bld: 185 mg/dL — ABNORMAL HIGH (ref 70–99)
POTASSIUM: 3.6 mmol/L (ref 3.5–5.1)
Potassium: 3.5 mmol/L (ref 3.5–5.1)
Sodium: 138 mmol/L (ref 135–145)
Sodium: 140 mmol/L (ref 135–145)

## 2018-09-07 MED ORDER — INSULIN GLARGINE 100 UNIT/ML ~~LOC~~ SOLN
8.0000 [IU] | Freq: Every day | SUBCUTANEOUS | Status: DC
  Administered 2018-09-07 – 2018-09-13 (×7): 8 [IU] via SUBCUTANEOUS
  Filled 2018-09-07 (×7): qty 0.08

## 2018-09-07 MED ORDER — INSULIN ASPART 100 UNIT/ML ~~LOC~~ SOLN
3.0000 [IU] | Freq: Three times a day (TID) | SUBCUTANEOUS | Status: DC
  Administered 2018-09-07 – 2018-09-12 (×13): 3 [IU] via SUBCUTANEOUS

## 2018-09-07 MED ORDER — INSULIN ASPART 100 UNIT/ML ~~LOC~~ SOLN
0.0000 [IU] | SUBCUTANEOUS | Status: DC
  Administered 2018-09-07: 3 [IU] via SUBCUTANEOUS

## 2018-09-07 MED ORDER — INSULIN GLARGINE 100 UNIT/ML ~~LOC~~ SOLN
10.0000 [IU] | Freq: Once | SUBCUTANEOUS | Status: DC
  Filled 2018-09-07: qty 0.1

## 2018-09-07 MED ORDER — INSULIN ASPART 100 UNIT/ML ~~LOC~~ SOLN
0.0000 [IU] | Freq: Three times a day (TID) | SUBCUTANEOUS | Status: DC
  Administered 2018-09-07: 7 [IU] via SUBCUTANEOUS
  Administered 2018-09-07: 11 [IU] via SUBCUTANEOUS
  Administered 2018-09-08: 4 [IU] via SUBCUTANEOUS
  Administered 2018-09-08: 3 [IU] via SUBCUTANEOUS
  Administered 2018-09-09 – 2018-09-10 (×3): 7 [IU] via SUBCUTANEOUS
  Administered 2018-09-10: 11 [IU] via SUBCUTANEOUS
  Administered 2018-09-11: 7 [IU] via SUBCUTANEOUS

## 2018-09-07 MED ORDER — INSULIN ASPART 100 UNIT/ML ~~LOC~~ SOLN
0.0000 [IU] | Freq: Every day | SUBCUTANEOUS | Status: DC
  Administered 2018-09-08 – 2018-09-10 (×2): 2 [IU] via SUBCUTANEOUS
  Administered 2018-09-11 – 2018-09-12 (×2): 3 [IU] via SUBCUTANEOUS
  Administered 2018-09-13: 2 [IU] via SUBCUTANEOUS
  Administered 2018-09-14 – 2018-09-15 (×2): 4 [IU] via SUBCUTANEOUS
  Administered 2018-09-16 – 2018-09-18 (×3): 2 [IU] via SUBCUTANEOUS

## 2018-09-07 NOTE — Progress Notes (Signed)
[facility] Progress Note  Patient: [name] [id number]:[id number]   PCP: Harlan Stains, MD DOB: 1931-11-03   DOA: 09/05/2018   DOS: 09/07/2018   Date of Service: the patient was seen and examined on 09/07/2018  Brief hospital course: Pt. with PMH of COPD, type II DM, malnutrition; admitted on 09/05/2018, presented with complaint of shortness of breath, was found to have COPD exacerbation with multifocal pneumonia and DKA. Currently further plan is continue current management.  Subjective: Feeling better no nausea no vomiting.  Breathing better.  Increasing cough.  No chest pain.  Assessment and Plan: 1.  Acute on chronic hypoxic respiratory failure. Multifocal pneumonia. COPD exacerbation. Comes with cough and shortness of breath.  Chest x-ray shows left lower lobe pneumonia as well as right lower lobe pneumonia. Started on IV antibiotics, continue IV ceftriaxone, oral azithromycin. Follow-up on blood cultures.  So far negative. Strep pneumo currently pending. Sputum culture pending. Started on IV Solu-Medrol, will transition to oral prednisone.  2.  DKA. Type 2 diabetes mellitus, uncontrolled with hyperglycemia without any complication. Labs this morning showed high anion gap metabolic acidosis. Lactic acid is normal. Beta hydroxybutyrate acid is elevated. Patient is hyperglycemic as well. Does have history of type 2 diabetes mellitus and recently taken off of her metformin. Likely steroid-induced hyperglycemia causing some metabolic acidosis. Patient was given IV fluid bolus as well as subcutaneous insulin with which her blood sugar has not improved. Patient was placed on DKA treatment protocol with IV insulin, IV fluids, n.p.o., BMP every 4 hours and CBG 1 hour. Anion gap is closed. Continue sliding scale insulin. On discharge patient will require sulfonylurea for short-term along with metformin.  3.  Accelerated hypertension. Chronic diastolic CHF Blood pressure  significantly elevated. Patient is on amlodipine, clonidine, Lasix, Avapro at home. Currently with AKI and DKA holding Lasix and Avapro.  Continue amlodipine and clonidine. Currently not volume overloaded but patient remains at risk for volume overload with aggressive IV hydration for DKA.  Transition to stepdown unit.  4.  Mood disorder. Continue home regimen for now.  5.  Dyslipidemia. Continue Pravachol.  6. Malnutrition Nutrition Problem: Severe Malnutrition Etiology: chronic illness Interventions: Interventions: Glucerna shake Monitor.  Diet: Carb modified diet  DVT Prophylaxis: subcutaneous Heparin  Advance goals of care discussion: Full code  Family Communication: family was present at bedside, at the time of interview. The pt provided permission to discuss medical plan with the family. Opportunity was given to ask question and all questions were answered satisfactorily.   Disposition:  Discharge to home likely Sunday with home health.  Consultants: None Procedures: None  Scheduled Meds: . amLODipine  10 mg Oral Daily  . aspirin EC  81 mg Oral QHS  . benzonatate  100 mg Oral TID  . budesonide (PULMICORT) nebulizer solution  0.5 mg Nebulization BID  . buPROPion  150 mg Oral Daily  . cholecalciferol  1,000 Units Oral QHS  . cloNIDine  0.1 mg Oral BID  . dextromethorphan-guaiFENesin  1 tablet Oral BID  . enoxaparin (LOVENOX) injection  30 mg Subcutaneous Q24H  . feeding supplement (GLUCERNA SHAKE)  237 mL Oral TID BM  . ferrous sulfate  325 mg Oral BID  . insulin aspart  0-20 Units Subcutaneous TID WC  . insulin aspart  0-5 Units Subcutaneous QHS  . insulin aspart  3 Units Subcutaneous TID WC  . insulin glargine  8 Units Subcutaneous Daily  . magnesium oxide  200 mg Oral Daily  . pravastatin  20 mg Oral QHS  . predniSONE  50 mg Oral Q breakfast  . sertraline  100 mg Oral q morning - 10a  . umeclidinium-vilanterol  1 puff Inhalation Daily   Continuous  Infusions: . azithromycin 500 mg (09/06/18 2147)  . cefTRIAXone (ROCEPHIN)  IV 1 g (09/06/18 2336)   PRN Meds: acetaminophen, albuterol, antiseptic oral rinse, guaiFENesin, hydrALAZINE, menthol-cetylpyridinium Antibiotics: Anti-infectives (From admission, onward)   Start     Dose/Rate Route Frequency Ordered Stop   09/06/18 2100  cefTRIAXone (ROCEPHIN) 1 g in sodium chloride 0.9 % 100 mL IVPB     1 g 200 mL/hr over 30 Minutes Intravenous Every 24 hours 09/05/18 2058 09/13/18 2059   09/06/18 2000  azithromycin (ZITHROMAX) 500 mg in sodium chloride 0.9 % 250 mL IVPB     500 mg 250 mL/hr over 60 Minutes Intravenous Every 24 hours 09/05/18 2058 09/13/18 1959   09/05/18 1800  cefTRIAXone (ROCEPHIN) 1 g in sodium chloride 0.9 % 100 mL IVPB     1 g 200 mL/hr over 30 Minutes Intravenous  Once 09/05/18 1748 09/05/18 1953   09/05/18 1800  azithromycin (ZITHROMAX) 500 mg in sodium chloride 0.9 % 250 mL IVPB     500 mg 250 mL/hr over 60 Minutes Intravenous  Once 09/05/18 1748 09/05/18 2012       Objective: Physical Exam: Vitals:   09/07/18 0048 09/07/18 0731 09/07/18 1234 09/07/18 1623  BP: (!) 132/56  (!) 146/54 (!) 150/135  Pulse: 96  67 69  Resp: 18  18 20   Temp: 98.3 F (36.8 C)  97.9 F (36.6 C) 97.8 F (36.6 C)  TempSrc: Oral  Oral Oral  SpO2: 96% 92% 94% 95%  Weight:      Height:        Intake/Output Summary (Last 24 hours) at 09/07/2018 1721 Last data filed at 09/07/2018 1233 Gross per 24 hour  Intake 1624.26 ml  Output 500 ml  Net 1124.26 ml   Filed Weights   09/05/18 1424  Weight: 45.4 kg   General: Alert, Awake and Oriented to Time, Place and Person. Appear in moderate distress, affect appropriate Eyes: PERRL, Conjunctiva normal ENT: Oral Mucosa clear moist. Neck: no JVD, no Abnormal Mass Or lumps Cardiovascular: S1 and S2 Present, no Murmur, Peripheral Pulses Present Respiratory: Increased respiratory effort, Bilateral Air entry equal and Decreased, no use of  accessory muscle, bilateral  Crackles, bilateral  wheezes Abdomen: Bowel Sound present, Soft and no tenderness, no hernia Skin: no redness, no Rash, no induration Extremities: no Pedal edema, no calf tenderness Neurologic: Grossly no focal neuro deficit. Bilaterally Equal motor strength  Data Reviewed: CBC: Recent Labs  Lab 09/05/18 1449 09/05/18 2114 09/06/18 0228 09/07/18 0504  WBC 12.9* 15.7* 13.4* 13.3*  NEUTROABS 9.9*  --   --   --   HGB 12.5 11.8* 11.9* 10.1*  HCT 40.4 37.4 37.3 31.3*  MCV 91.6 90.6 91.9 91.3  PLT 226 231 246 397   Basic Metabolic Panel: Recent Labs  Lab 09/06/18 1040 09/06/18 1317 09/06/18 1643 09/06/18 2306 09/07/18 0504  NA 140 137 138 138 140  K 3.8 3.9 3.8 3.6 3.5  CL 107 106 108 110 108  CO2 19* 19* 18* 18* 22  GLUCOSE 444* 496* 253* 172* 185*  BUN 37* 40* 46* 47* 45*  CREATININE 1.16* 1.39* 1.52* 1.22* 1.25*  CALCIUM 8.4* 8.5* 8.6* 8.5* 8.0*    Liver Function Tests: Recent Labs  Lab 09/06/18 0228  AST 49*  ALT  39  ALKPHOS 75  BILITOT 0.9  PROT 6.5  ALBUMIN 3.1*   No results for input(s): LIPASE, AMYLASE in the last 168 hours. No results for input(s): AMMONIA in the last 168 hours. Coagulation Profile: No results for input(s): INR, PROTIME in the last 168 hours. Cardiac Enzymes: No results for input(s): CKTOTAL, CKMB, CKMBINDEX, TROPONINI in the last 168 hours. BNP (last 3 results) No results for input(s): PROBNP in the last 8760 hours. CBG: Recent Labs  Lab 09/07/18 0509 09/07/18 0611 09/07/18 0734 09/07/18 1204 09/07/18 1621  GLUCAP 163* 186* 190* 233* 256*   Studies: Dg Chest Port 1 View  Result Date: 09/06/2018 CLINICAL DATA:  Shortness of breath EXAM: PORTABLE CHEST 1 VIEW COMPARISON:  09/05/2018, 07/01/2018, 11/14/2017, CT chest 08/28/2018 FINDINGS: Small left-sided pleural effusion. Emphysematous disease. Probable skin fold artifact over the right thorax. Mild interstitial and alveolar opacity at both bases,  slight increase. Post sternotomy changes. Aortic atherosclerosis. IMPRESSION: 1. Similar appearance of small left pleural effusion and bibasilar interstitial and hazy airspace disease which may reflect acute infiltrate superimposed on chronic change/post treatment change 2. Emphysematous disease Electronically Signed   By: Donavan Foil M.D.   On: 09/06/2018 19:25     Time spent: 35 minutes  Author: Berle Mull, MD [facility] Pager: [phone] 09/07/2018 5:21 PM  Between 7PM-7AM, please contact night-coverage at [email or web address], password [facility]

## 2018-09-07 NOTE — Progress Notes (Signed)
Pt SOB at change of shift after transferring to/from [facility].  PRN breathing treatment administered, by previous RN.    Presently, BP elevated (see flowsheet).  Pt is asymptomatic.  Appears to be calming down slowly from transferring.  Will recheck shortly and monitor.

## 2018-09-08 ENCOUNTER — Inpatient Hospital Stay (HOSPITAL_COMMUNITY): Payer: Medicare Other

## 2018-09-08 DIAGNOSIS — J9621 Acute and chronic respiratory failure with hypoxia: Secondary | ICD-10-CM

## 2018-09-08 LAB — CBC
HEMATOCRIT: 33.1 % — AB (ref 36.0–46.0)
Hemoglobin: 10.3 g/dL — ABNORMAL LOW (ref 12.0–15.0)
MCH: 28.2 pg (ref 26.0–34.0)
MCHC: 31.1 g/dL (ref 30.0–36.0)
MCV: 90.7 fL (ref 80.0–100.0)
NRBC: 0 % (ref 0.0–0.2)
Platelets: 232 10*3/uL (ref 150–400)
RBC: 3.65 MIL/uL — ABNORMAL LOW (ref 3.87–5.11)
RDW: 13.5 % (ref 11.5–15.5)
WBC: 12.9 10*3/uL — ABNORMAL HIGH (ref 4.0–10.5)

## 2018-09-08 LAB — GLUCOSE, CAPILLARY
GLUCOSE-CAPILLARY: 192 mg/dL — AB (ref 70–99)
GLUCOSE-CAPILLARY: 139 mg/dL — AB (ref 70–99)
Glucose-Capillary: 111 mg/dL — ABNORMAL HIGH (ref 70–99)
Glucose-Capillary: 228 mg/dL — ABNORMAL HIGH (ref 70–99)

## 2018-09-08 LAB — BASIC METABOLIC PANEL
Anion gap: 10 (ref 5–15)
BUN: 32 mg/dL — ABNORMAL HIGH (ref 8–23)
CO2: 23 mmol/L (ref 22–32)
Calcium: 9.2 mg/dL (ref 8.9–10.3)
Chloride: 110 mmol/L (ref 98–111)
Creatinine, Ser: 1.18 mg/dL — ABNORMAL HIGH (ref 0.44–1.00)
GFR calc Af Amer: 49 mL/min — ABNORMAL LOW (ref >60)
GFR calc non Af Amer: 43 mL/min — ABNORMAL LOW (ref >60)
Glucose, Bld: 133 mg/dL — ABNORMAL HIGH (ref 70–99)
Potassium: 3.5 mmol/L (ref 3.5–5.1)
SODIUM: 143 mmol/L (ref 135–145)

## 2018-09-08 MED ORDER — FENTANYL CITRATE (PF) 100 MCG/2ML IJ SOLN
50.0000 ug | Freq: Once | INTRAMUSCULAR | Status: AC
  Administered 2018-09-08: 50 ug via INTRAVENOUS

## 2018-09-08 MED ORDER — OXYCODONE-ACETAMINOPHEN 5-325 MG PO TABS
1.0000 | ORAL_TABLET | ORAL | Status: DC | PRN
  Administered 2018-09-08 (×2): 1 via ORAL
  Filled 2018-09-08 (×2): qty 1

## 2018-09-08 MED ORDER — MIDAZOLAM HCL 2 MG/2ML IJ SOLN
1.0000 mg | Freq: Once | INTRAMUSCULAR | Status: AC
  Administered 2018-09-08: 1 mg via INTRAVENOUS

## 2018-09-08 MED ORDER — FENTANYL CITRATE (PF) 100 MCG/2ML IJ SOLN
INTRAMUSCULAR | Status: AC
  Administered 2018-09-08: 50 ug via INTRAVENOUS
  Filled 2018-09-08: qty 2

## 2018-09-08 MED ORDER — MIDAZOLAM HCL 2 MG/2ML IJ SOLN
INTRAMUSCULAR | Status: AC
  Administered 2018-09-08: 1 mg via INTRAVENOUS
  Filled 2018-09-08: qty 2

## 2018-09-08 MED ORDER — AZITHROMYCIN 250 MG PO TABS
500.0000 mg | ORAL_TABLET | Freq: Every day | ORAL | Status: DC
  Administered 2018-09-08 – 2018-09-11 (×4): 500 mg via ORAL
  Filled 2018-09-08 (×4): qty 2

## 2018-09-08 NOTE — Procedures (Signed)
Chest Tube Insertion Procedure Note  Indications:  Clinically significant Pneumothorax  Procedure Details  Informed consent was obtained for the procedure, including sedation.  Risks of lung perforation, hemorrhage, arrhythmia, and adverse drug reaction were discussed.   After sterile skin prep, using standard technique, a wayne catheter chest tube was placed in the left lateral 4th rib space.  Estimated Blood Loss:  Minimal         Specimens:  None              Complications:  None; patient tolerated the procedure well.         Condition: stable  Marshell Garfinkel MD [facility] 09/08/2018, 12:14 PM

## 2018-09-08 NOTE — Progress Notes (Signed)
[facility] Progress Note  Patient: [name] UMP:[id number]   PCP: Harlan Stains, MD DOB: 04-14-1932   DOA: 09/05/2018   DOS: 09/08/2018   Date of Service: the patient was seen and examined on 09/08/2018  Brief hospital course: Pt. with PMH of COPD, type II DM, malnutrition; admitted on 09/05/2018, presented with complaint of shortness of breath, was found to have COPD exacerbation with multifocal pneumonia and DKA. Currently further plan is continue current management.  Subjective: No chest pain.  Still short of breath.  Still dry cough.  No nausea no vomiting.  Assessment and Plan: 1.  Acute on chronic hypoxic respiratory failure. Multifocal pneumonia. COPD exacerbation. Acute pneumothorax Comes with cough and shortness of breath.  Chest x-ray shows left lower lobe pneumonia as well as right lower lobe pneumonia. Started on IV antibiotics, continue IV ceftriaxone, oral azithromycin. Follow-up on blood cultures.  So far negative. Strep pneumo negative Started on IV Solu-Medrol, will transition to oral prednisone. Chest x-ray on 09/09/2018 shows acute pneumothorax.  Pulmonary was consulted and patient underwent chest tube placement.  Continue pain control with oral Percocet.  2.  DKA. Type 2 diabetes mellitus, uncontrolled with hyperglycemia without any complication. Labs this morning showed high anion gap metabolic acidosis. Lactic acid is normal. Beta hydroxybutyrate acid is elevated. Patient is hyperglycemic as well. Does have history of type 2 diabetes mellitus and recently taken off of her metformin. Likely steroid-induced hyperglycemia causing some metabolic acidosis. Patient was given IV fluid bolus as well as subcutaneous insulin with which her blood sugar has not improved. Patient was placed on DKA treatment protocol with IV insulin, IV fluids, n.p.o., BMP every 4 hours and CBG 1 hour. Anion gap is closed. Continue sliding scale insulin. On discharge patient  will require sulfonylurea for short-term along with metformin.  3.  Accelerated hypertension. Chronic diastolic CHF Blood pressure significantly elevated. Patient is on amlodipine, clonidine, Lasix, Avapro at home. Currently with AKI and DKA holding Lasix and Avapro.  Continue amlodipine and clonidine. Currently not volume overloaded but patient remains at risk for volume overload with aggressive IV hydration for DKA.  Transition to stepdown unit.  4.  Mood disorder. Continue home regimen for now.  5.  Dyslipidemia. Continue Pravachol.  6. Malnutrition Nutrition Problem: Severe Malnutrition Etiology: chronic illness Interventions: Interventions: Glucerna shake Monitor.  Diet: Carb modified diet  DVT Prophylaxis: subcutaneous Heparin  Advance goals of care discussion: Full code  Family Communication: family was present at bedside, at the time of interview. The pt provided permission to discuss medical plan with the family. Opportunity was given to ask question and all questions were answered satisfactorily.   Disposition:  Discharge to home likely Sunday with home health.  Consultants: None Procedures: None  Scheduled Meds: . amLODipine  10 mg Oral Daily  . aspirin EC  81 mg Oral QHS  . azithromycin  500 mg Oral Daily  . benzonatate  100 mg Oral TID  . budesonide (PULMICORT) nebulizer solution  0.5 mg Nebulization BID  . buPROPion  150 mg Oral Daily  . cholecalciferol  1,000 Units Oral QHS  . cloNIDine  0.1 mg Oral BID  . dextromethorphan-guaiFENesin  1 tablet Oral BID  . enoxaparin (LOVENOX) injection  30 mg Subcutaneous Q24H  . feeding supplement (GLUCERNA SHAKE)  237 mL Oral TID BM  . ferrous sulfate  325 mg Oral BID  . insulin aspart  0-20 Units Subcutaneous TID WC  . insulin aspart  0-5 Units Subcutaneous QHS  .  insulin aspart  3 Units Subcutaneous TID WC  . insulin glargine  8 Units Subcutaneous Daily  . magnesium oxide  200 mg Oral Daily  . pravastatin  20  mg Oral QHS  . predniSONE  50 mg Oral Q breakfast  . sertraline  100 mg Oral q morning - 10a  . umeclidinium-vilanterol  1 puff Inhalation Daily   Continuous Infusions: . cefTRIAXone (ROCEPHIN)  IV Stopped (09/08/18 0700)   PRN Meds: acetaminophen, albuterol, antiseptic oral rinse, guaiFENesin, hydrALAZINE, menthol-cetylpyridinium, oxyCODONE-acetaminophen Antibiotics: Anti-infectives (From admission, onward)   Start     Dose/Rate Route Frequency Ordered Stop   09/08/18 2000  azithromycin (ZITHROMAX) tablet 500 mg     500 mg Oral Daily 09/08/18 0809 09/13/18 1959   09/06/18 2100  cefTRIAXone (ROCEPHIN) 1 g in sodium chloride 0.9 % 100 mL IVPB     1 g 200 mL/hr over 30 Minutes Intravenous Every 24 hours 09/05/18 2058 09/13/18 2059   09/06/18 2000  azithromycin (ZITHROMAX) 500 mg in sodium chloride 0.9 % 250 mL IVPB  Status:  Discontinued     500 mg 250 mL/hr over 60 Minutes Intravenous Every 24 hours 09/05/18 2058 09/08/18 0809   09/05/18 1800  cefTRIAXone (ROCEPHIN) 1 g in sodium chloride 0.9 % 100 mL IVPB     1 g 200 mL/hr over 30 Minutes Intravenous  Once 09/05/18 1748 09/05/18 1953   09/05/18 1800  azithromycin (ZITHROMAX) 500 mg in sodium chloride 0.9 % 250 mL IVPB     500 mg 250 mL/hr over 60 Minutes Intravenous  Once 09/05/18 1748 09/05/18 2012       Objective: Physical Exam: Vitals:   09/07/18 2036 09/07/18 2152 09/07/18 2355 09/08/18 0745  BP: 137/69 (!) 170/64 131/61 (!) 184/68  Pulse:   63 68  Resp:   (!) 22 20  Temp:   98.4 F (36.9 C) 97.7 F (36.5 C)  TempSrc:   Oral   SpO2:   97% 100%  Weight:      Height:        Intake/Output Summary (Last 24 hours) at 09/08/2018 1619 Last data filed at 09/08/2018 1400 Gross per 24 hour  Intake 1070 ml  Output -  Net 1070 ml   Filed Weights   09/05/18 1424  Weight: 45.4 kg   General: Alert, Awake and Oriented to Time, Place and Person. Appear in moderate distress, affect appropriate Eyes: PERRL, Conjunctiva  normal ENT: Oral Mucosa clear moist. Neck: no JVD, no Abnormal Mass Or lumps Cardiovascular: S1 and S2 Present, no Murmur, Peripheral Pulses Present Respiratory: Increased respiratory effort, Bilateral Air entry equal and Decreased, no use of accessory muscle, bilateral  Crackles, bilateral  wheezes Abdomen: Bowel Sound present, Soft and no tenderness, no hernia Skin: no redness, no Rash, no induration Extremities: no Pedal edema, no calf tenderness Neurologic: Grossly no focal neuro deficit. Bilaterally Equal motor strength  Data Reviewed: CBC: Recent Labs  Lab 09/05/18 1449 09/05/18 2114 09/06/18 0228 09/07/18 0504 09/08/18 0227  WBC 12.9* 15.7* 13.4* 13.3* 12.9*  NEUTROABS 9.9*  --   --   --   --   HGB 12.5 11.8* 11.9* 10.1* 10.3*  HCT 40.4 37.4 37.3 31.3* 33.1*  MCV 91.6 90.6 91.9 91.3 90.7  PLT 226 231 246 239 938   Basic Metabolic Panel: Recent Labs  Lab 09/06/18 1317 09/06/18 1643 09/06/18 2306 09/07/18 0504 09/08/18 0816  NA 137 138 138 140 143  K 3.9 3.8 3.6 3.5 3.5  CL 106 108  110 108 110  CO2 19* 18* 18* 22 23  GLUCOSE 496* 253* 172* 185* 133*  BUN 40* 46* 47* 45* 32*  CREATININE 1.39* 1.52* 1.22* 1.25* 1.18*  CALCIUM 8.5* 8.6* 8.5* 8.0* 9.2    Liver Function Tests: Recent Labs  Lab 09/06/18 0228  AST 49*  ALT 39  ALKPHOS 75  BILITOT 0.9  PROT 6.5  ALBUMIN 3.1*   No results for input(s): LIPASE, AMYLASE in the last 168 hours. No results for input(s): AMMONIA in the last 168 hours. Coagulation Profile: No results for input(s): INR, PROTIME in the last 168 hours. Cardiac Enzymes: No results for input(s): CKTOTAL, CKMB, CKMBINDEX, TROPONINI in the last 168 hours. BNP (last 3 results) No results for input(s): PROBNP in the last 8760 hours. CBG: Recent Labs  Lab 09/07/18 1204 09/07/18 1621 09/07/18 2132 09/08/18 0730 09/08/18 1219  GLUCAP 233* 256* 168* 111* 192*   Studies: Dg Chest Port 1 View  Result Date: 09/08/2018 CLINICAL DATA:   Pneumothorax, chest tube EXAM: PORTABLE CHEST 1 VIEW COMPARISON:  Portable exam 1512 hours compared to 0909 hours FINDINGS: New pigtail LEFT thoracostomy tube. Persistent moderate LEFT pneumothorax estimated 20% though decreased from previous exam. Persistent LEFT pleural effusion. Underlying emphysematous and bronchitic changes consistent with COPD. Skin fold projects over LEFT chest as well. Persistent atelectasis versus infiltrate at RIGHT base. Normal heart size post AVR. Atherosclerotic calcifications aorta. IMPRESSION: Moderate LEFT pneumothorax decreased since pigtail thoracostomy tube placement. COPD changes with LEFT pleural effusion and RIGHT basilar atelectasis versus infiltrate. Electronically Signed   By: Lavonia Dana M.D.   On: 09/08/2018 15:30   Dg Chest Port 1 View  Addendum Date: 09/08/2018   ADDENDUM REPORT: 09/08/2018 10:28 ADDENDUM: Critical Value/emergent results were called by telephone at the time of interpretation on 09/08/2018 at 9:58 a.m. to Dr. Berle Mull , who verbally acknowledged these results. Electronically Signed   By: Marin Olp M.D.   On: 09/08/2018 10:28   Result Date: 09/08/2018 CLINICAL DATA:  Shortness of breath getting better. EXAM: PORTABLE CHEST 1 VIEW COMPARISON:  09/06/2018 FINDINGS: Lungs are adequately inflated with emphysematous disease. There is a new moderate left pneumothorax worse over the base. Associated atelectasis/collapse in the left base. Persistent interstitial changes over the right base. Cardiomediastinal silhouette and remainder the exam is unchanged. IMPRESSION: New moderate left pneumothorax worse over the left base with associated left lower lobe collapse/atelectasis. Stable mild interstitial changes right base. Emphysema (ICD10-J43.9). Electronically Signed: By: Marin Olp M.D. On: 09/08/2018 09:26     Time spent: 35 minutes  Author: Berle Mull, MD [facility] Pager: [phone] 09/08/2018 4:19 PM  Between 7PM-7AM,  please contact night-coverage at [email or web address], password [facility]

## 2018-09-08 NOTE — Plan of Care (Signed)
Pt with dyspnea with minimal exertion.  Very little activity tolerance.

## 2018-09-08 NOTE — Consult Note (Signed)
   NAME:  [name], MRN:  [id number], DOB:  1931-10-19, LOS: 3 ADMISSION DATE:  09/05/2018, CONSULTATION DATE:  09/08/2018 REFERRING MD:  Marlowe Sax MD, CHIEF COMPLAINT:  Lt pneumothorax   Brief History   84 year old with COPD admitted for acute respiratory failure, multifocal pneumonia, DKA Developed spontaneous pneumothorax on 12/22.  PCCM called for management  Past Medical History  COPD, type 2 diabetes, diabetes mellitus, coronary artery disease, GERD, Stage I lung cancer [presumed stage I  She follows with Dr. Chase Caller.  History noted for PET positive, left lower lobe spiculated nodule Discussed at multidisciplinary tumor board and biopsy deferred, Underwent SBRT in April 2019  [facility] Events   12/19 > admit 12/22 Lt spontaneous pneumothorax s/p chest tube  Consults:  PCCM 12/22 >  Procedures:  Lt chest tube 12/22 >  Significant Diagnostic Tests:  CT chest 08/28/2018- advanced emphysema, left lower lobe lung nodule with bandlike interstitial airspace opacity suggestive of radiation fibrosis.  Chest x-ray 12/22 > moderate-sized left pneumothorax I have reviewed the images personally.  Micro Data:  Blood culture 12/19- no growth  Antimicrobials:  Ceftriaxone 12/19>> Azithromycin 12/19>>  Interim history/subjective:    Objective   Blood pressure (!) 184/68, pulse 68, temperature 97.7 F (36.5 C), resp. rate 20, height 5' (1.524 m), weight 45.4 kg, SpO2 100 %.        Intake/Output Summary (Last 24 hours) at 09/08/2018 1216 Last data filed at 09/08/2018 3734 Gross per 24 hour  Intake 1190 ml  Output -  Net 1190 ml   Filed Weights   09/05/18 1424  Weight: 45.4 kg   Examination: Gen:      Mild distress HEENT:  EOMI, sclera anicteric Neck:     No masses; no thyromegaly Lungs:    Diminished air entry on the left CV:         Regular rate and rhythm; no murmurs Abd:      + bowel sounds; soft, non-tender; no palpable masses, no  distension Ext:    No edema; adequate peripheral perfusion Skin:      Warm and dry; no rash Neuro: alert and oriented x 3 Psych: normal mood and affect  Resolved Hospital Problem list     Assessment & Plan:  84 year old with advanced emphysema, lung cancer, AE COPD, PNA, spontaneous left pneumothorax  Left pneumothorax Appears to have been precipitated by a bout of coughing Chest tube placed, maintain to suction Get repeat chest x-ray  Acute on chronic respiratory failure COPD, pneumonia Continue ceftriaxone, azithromycin Prednisone, bronchodilators  Marshell Garfinkel MD [facility] 09/08/2018, 12:30 PM

## 2018-09-08 NOTE — Evaluation (Addendum)
Physical Therapy Evaluation Patient Details Name: [name] MRN: [id number] DOB: 06-12-1932 Today's Date: 09/08/2018   History of Present Illness  Patient is a 84 y/o female who presents from PCP office with concern for PNA- reports SOB, cough for a few days. CXR- multifocal PNA. Found to have acute on chronic respiratory failure secondary to COPD and PNA. Also DKA. PMH includes COPD, depression, HTN, DM, CAD s/p CABG.  Clinical Impression  Patient presents with deconditioning, decreased endurance, impaired balance, dyspnea on exertion and impaired mobility s/p above. Tolerated gait training with Min guard-Min A for balance/safety. Pt Mod I PTA, does her own ADLs and uses SPC for mobility. Reports lots of falls when not using DME, question safety awareness. "I cannot stand on 1 foot, like when I turn then I fall." Spouse is with her 24/7 and per her report able to provide assist. Pt with 3/4 DOE and SP02 remained 88-90% on 6L/min 02 which is higher than her normal need. Recommend use of RW at home for safety instead of care. Pt agrees. Encouraged OOB to chair as tolerated and walking to bathroom with nursing. Will follow acutely to maximize independence and mobility prior to return home.    Follow Up Recommendations Home health PT;Supervision for mobility/OOB    Equipment Recommendations  None recommended by PT    Recommendations for Other Services       Precautions / Restrictions Precautions Precautions: Fall Precaution Comments: watch 02 Restrictions Weight Bearing Restrictions: No      Mobility  Bed Mobility Overal bed mobility: Needs Assistance Bed Mobility: Supine to Sit     Supine to sit: Supervision;HOB elevated     General bed mobility comments: Use of rail, no physical assist.   Transfers Overall transfer level: Needs assistance Equipment used: Rolling walker (2 wheeled) Transfers: Sit to/from Stand Sit to Stand: Min guard         General transfer  comment: Close Min guard for safety with some difficulty and increased effort. sp02 88% on 5L/min 02 sitting EOB.  Ambulation/Gait Ambulation/Gait assistance: Min guard;Min assist Gait Distance (Feet): 100 Feet Assistive device: Rolling walker (2 wheeled) Gait Pattern/deviations: Step-through pattern;Decreased stride length;Narrow base of support;Trunk flexed Gait velocity: decreased   General Gait Details: Slow, mildly unsteady gait with RW; some difficulty with turns. Cues for safety. 3/4 DOE. Sp02 difficult to get reading but read 90% on 6L/min 02.   Stairs            Wheelchair Mobility    Modified Rankin (Stroke Patients Only)       Balance Overall balance assessment: Needs assistance;History of Falls Sitting-balance support: Feet supported;No upper extremity supported Sitting balance-Leahy Scale: Good     Standing balance support: During functional activity;Bilateral upper extremity supported Standing balance-Leahy Scale: Poor Standing balance comment: Reliant on BUEs for support in standing.                              Pertinent Vitals/Pain Pain Assessment: No/denies pain    Home Living Family/patient expects to be discharged to:: Private residence Living Arrangements: Spouse/significant other Available Help at Discharge: Family;Available 24 hours/day Type of Home: House Home Access: Stairs to enter Entrance Stairs-Rails: Right Entrance Stairs-Number of Steps: 3+ 4 Home Layout: Two level;Laundry or work area in basement;Able to live on main level with bedroom/bathroom Home Equipment: Grab bars - tub/shower;Walker - 2 wheels;Walker - 4 wheels;Cane - single point  Prior Function Level of Independence: Independent with assistive device(s)         Comments: Does her own ADLs, does not do any IADLs. Does not drive.      Hand Dominance   Dominant Hand: Right    Extremity/Trunk Assessment   Upper Extremity Assessment Upper Extremity  Assessment: Defer to OT evaluation    Lower Extremity Assessment Lower Extremity Assessment: Generalized weakness    Cervical / Trunk Assessment Cervical / Trunk Assessment: Kyphotic  Communication   Communication: No difficulties  Cognition Arousal/Alertness: Awake/alert Behavior During Therapy: WFL for tasks assessed/performed Overall Cognitive Status: No family/caregiver present to determine baseline cognitive functioning                                 General Comments: for simple basic tasks. Concerns about some safety awareness- reports falls but does not want to use DME.      General Comments General comments (skin integrity, edema, etc.): SP02 ranged from 88-90% on 5-6L/min 02.     Exercises     Assessment/Plan    PT Assessment Patient needs continued PT services  PT Problem List Decreased mobility;Decreased safety awareness;Cardiopulmonary status limiting activity;Decreased cognition;Decreased activity tolerance;Decreased balance       PT Treatment Interventions Functional mobility training;Balance training;Patient/family education;Gait training;Therapeutic activities;Stair training;Therapeutic exercise;DME instruction    PT Goals (Current goals can be found in the Care Plan section)  Acute Rehab PT Goals Patient Stated Goal: to go home today PT Goal Formulation: With patient Time For Goal Achievement: 09/22/18 Potential to Achieve Goals: Good    Frequency Min 3X/week   Barriers to discharge Inaccessible home environment stairs    Co-evaluation               AM-PAC PT "6 Clicks" Mobility  Outcome Measure Help needed turning from your back to your side while in a flat bed without using bedrails?: None Help needed moving from lying on your back to sitting on the side of a flat bed without using bedrails?: A Little Help needed moving to and from a bed to a chair (including a wheelchair)?: A Little Help needed standing up from a chair  using your arms (e.g., wheelchair or bedside chair)?: A Little Help needed to walk in hospital room?: A Little Help needed climbing 3-5 steps with a railing? : A Lot 6 Click Score: 18    End of Session Equipment Utilized During Treatment: Oxygen;Gait belt Activity Tolerance: Patient limited by fatigue Patient left: in bed;with call bell/phone within reach;with bed alarm set(sitting EOB) Nurse Communication: Mobility status PT Visit Diagnosis: Difficulty in walking, not elsewhere classified (R26.2);Unsteadiness on feet (R26.81);History of falling (Z91.81)    Time: 3009-2330 PT Time Calculation (min) (ACUTE ONLY): 26 min   Charges:   PT Evaluation $PT Eval Moderate Complexity: 1 Mod PT Treatments $Gait Training: 8-22 mins        Wray Kearns, PT, DPT Acute Rehabilitation Services Pager [phone] Office [place] 09/08/2018, 9:10 AM

## 2018-09-09 ENCOUNTER — Inpatient Hospital Stay (HOSPITAL_COMMUNITY): Payer: Medicare Other

## 2018-09-09 LAB — GLUCOSE, CAPILLARY
GLUCOSE-CAPILLARY: 130 mg/dL — AB (ref 70–99)
Glucose-Capillary: 101 mg/dL — ABNORMAL HIGH (ref 70–99)
Glucose-Capillary: 234 mg/dL — ABNORMAL HIGH (ref 70–99)
Glucose-Capillary: 238 mg/dL — ABNORMAL HIGH (ref 70–99)

## 2018-09-09 LAB — CBC
HCT: 34.2 % — ABNORMAL LOW (ref 36.0–46.0)
HEMOGLOBIN: 10.9 g/dL — AB (ref 12.0–15.0)
MCH: 29.1 pg (ref 26.0–34.0)
MCHC: 31.9 g/dL (ref 30.0–36.0)
MCV: 91.2 fL (ref 80.0–100.0)
Platelets: 256 10*3/uL (ref 150–400)
RBC: 3.75 MIL/uL — ABNORMAL LOW (ref 3.87–5.11)
RDW: 13.7 % (ref 11.5–15.5)
WBC: 12.9 10*3/uL — ABNORMAL HIGH (ref 4.0–10.5)
nRBC: 0 % (ref 0.0–0.2)

## 2018-09-09 MED ORDER — CLONIDINE HCL 0.1 MG PO TABS
0.1000 mg | ORAL_TABLET | Freq: Three times a day (TID) | ORAL | Status: DC
  Administered 2018-09-09 – 2018-09-20 (×34): 0.1 mg via ORAL
  Filled 2018-09-09 (×33): qty 1

## 2018-09-09 MED ORDER — OXYCODONE HCL 5 MG PO TABS
5.0000 mg | ORAL_TABLET | ORAL | Status: DC | PRN
  Administered 2018-09-09 – 2018-09-20 (×19): 5 mg via ORAL
  Filled 2018-09-09 (×20): qty 1

## 2018-09-09 MED ORDER — OXYCODONE-ACETAMINOPHEN 5-325 MG PO TABS
1.0000 | ORAL_TABLET | ORAL | Status: DC | PRN
  Administered 2018-09-09 – 2018-09-20 (×15): 1 via ORAL
  Filled 2018-09-09 (×15): qty 1

## 2018-09-09 NOTE — Care Management Important Message (Signed)
Important Message  Patient Details  Name: [name] MRN: [id number] Date of Birth: 02-Jul-1932   Medicare Important Message Given:  Yes    Samora Jernberg [address] 09/09/2018, 4:04 PM

## 2018-09-09 NOTE — Progress Notes (Signed)
[facility] Progress Note  Patient: [name] AJG:[id number]   PCP: Harlan Stains, MD DOB: 04-10-1932   DOA: 09/05/2018   DOS: 09/09/2018   Date of Service: the patient was seen and examined on 09/09/2018  Brief hospital course: Pt. with PMH of COPD, type II DM, malnutrition; admitted on 09/05/2018, presented with complaint of shortness of breath, was found to have COPD exacerbation with multifocal pneumonia and DKA. Currently further plan is continue current management.  Subjective: No acute complaint.  Tolerated chest tube insertion yesterday.  Pain is well controlled but still remains a concern.  Assessment and Plan: 1.  Acute on chronic hypoxic respiratory failure. Multifocal pneumonia. COPD exacerbation. Acute pneumothorax Comes with cough and shortness of breath.  Chest x-ray shows left lower lobe pneumonia as well as right lower lobe pneumonia. Started on IV antibiotics, continue IV ceftriaxone, oral azithromycin. Follow-up on blood cultures.  So far negative. Strep pneumo negative Started on IV Solu-Medrol, will transition to oral prednisone. Chest x-ray on 09/09/2018 shows acute pneumothorax.  Pulmonary was consulted and patient underwent chest tube placement.  Continue pain control with oral Percocet.  Increase the dose. Appreciate pulmonary assistance.  2.  DKA. Type 2 diabetes mellitus, uncontrolled with hyperglycemia without any complication. Labs this morning showed high anion gap metabolic acidosis. Lactic acid is normal. Beta hydroxybutyrate acid is elevated. Patient is hyperglycemic as well. Does have history of type 2 diabetes mellitus and recently taken off of her metformin. Likely steroid-induced hyperglycemia causing some metabolic acidosis. Patient was given IV fluid bolus as well as subcutaneous insulin with which her blood sugar has not improved. Patient was placed on DKA treatment protocol with IV insulin, IV fluids, n.p.o., BMP every 4 hours  and CBG 1 hour. Anion gap is closed. Continue sliding scale insulin. On discharge patient will require sulfonylurea for short-term along with metformin.  3.  Accelerated hypertension. Chronic diastolic CHF Blood pressure significantly elevated. Patient is on amlodipine, clonidine, Lasix, Avapro at home. Currently with AKI and DKA holding Lasix and Avapro.  Continue amlodipine and clonidine. Currently not volume overloaded but patient remains at risk for volume overload with aggressive IV hydration for DKA.  Transition to stepdown unit.  4.  Mood disorder. Continue home regimen for now.  5.  Dyslipidemia. Continue Pravachol.  6. Malnutrition Nutrition Problem: Severe Malnutrition Etiology: chronic illness Interventions: Interventions: Glucerna shake Monitor.  Diet: Carb modified diet  DVT Prophylaxis: subcutaneous Heparin  Advance goals of care discussion: Full code  Family Communication: family was present at bedside, at the time of interview. The pt provided permission to discuss medical plan with the family. Opportunity was given to ask question and all questions were answered satisfactorily.   Disposition:  Discharge to home  Consultants: None Procedures: None  Scheduled Meds: . amLODipine  10 mg Oral Daily  . aspirin EC  81 mg Oral QHS  . azithromycin  500 mg Oral Daily  . benzonatate  100 mg Oral TID  . budesonide (PULMICORT) nebulizer solution  0.5 mg Nebulization BID  . buPROPion  150 mg Oral Daily  . cholecalciferol  1,000 Units Oral QHS  . cloNIDine  0.1 mg Oral Q8H  . dextromethorphan-guaiFENesin  1 tablet Oral BID  . enoxaparin (LOVENOX) injection  30 mg Subcutaneous Q24H  . feeding supplement (GLUCERNA SHAKE)  237 mL Oral TID BM  . ferrous sulfate  325 mg Oral BID  . insulin aspart  0-20 Units Subcutaneous TID WC  . insulin aspart  0-5  Units Subcutaneous QHS  . insulin aspart  3 Units Subcutaneous TID WC  . insulin glargine  8 Units Subcutaneous Daily   . magnesium oxide  200 mg Oral Daily  . pravastatin  20 mg Oral QHS  . predniSONE  50 mg Oral Q breakfast  . sertraline  100 mg Oral q morning - 10a  . umeclidinium-vilanterol  1 puff Inhalation Daily   Continuous Infusions: . cefTRIAXone (ROCEPHIN)  IV Stopped (09/09/18 0700)   PRN Meds: acetaminophen, albuterol, antiseptic oral rinse, guaiFENesin, hydrALAZINE, menthol-cetylpyridinium, oxyCODONE-acetaminophen **AND** oxyCODONE Antibiotics: Anti-infectives (From admission, onward)   Start     Dose/Rate Route Frequency Ordered Stop   09/08/18 2000  azithromycin (ZITHROMAX) tablet 500 mg     500 mg Oral Daily 09/08/18 0809 09/13/18 1959   09/06/18 2100  cefTRIAXone (ROCEPHIN) 1 g in sodium chloride 0.9 % 100 mL IVPB     1 g 200 mL/hr over 30 Minutes Intravenous Every 24 hours 09/05/18 2058 09/13/18 2059   09/06/18 2000  azithromycin (ZITHROMAX) 500 mg in sodium chloride 0.9 % 250 mL IVPB  Status:  Discontinued     500 mg 250 mL/hr over 60 Minutes Intravenous Every 24 hours 09/05/18 2058 09/08/18 0809   09/05/18 1800  cefTRIAXone (ROCEPHIN) 1 g in sodium chloride 0.9 % 100 mL IVPB     1 g 200 mL/hr over 30 Minutes Intravenous  Once 09/05/18 1748 09/05/18 1953   09/05/18 1800  azithromycin (ZITHROMAX) 500 mg in sodium chloride 0.9 % 250 mL IVPB     500 mg 250 mL/hr over 60 Minutes Intravenous  Once 09/05/18 1748 09/05/18 2012       Objective: Physical Exam: Vitals:   09/09/18 0733 09/09/18 0745 09/09/18 1356 09/09/18 1500  BP: (!) 214/81  128/79 130/80  Pulse: 66  66 67  Resp:      Temp: 98.9 F (37.2 C)   98 F (36.7 C)  TempSrc: Oral   Oral  SpO2: 97% 95% 100% 100%  Weight:      Height:        Intake/Output Summary (Last 24 hours) at 09/09/2018 1654 Last data filed at 09/09/2018 0900 Gross per 24 hour  Intake 880 ml  Output 1060 ml  Net -180 ml   Filed Weights   09/05/18 1424  Weight: 45.4 kg   General: Alert, Awake and Oriented to Time, Place and Person.  Appear in moderate distress, affect appropriate Eyes: PERRL, Conjunctiva normal ENT: Oral Mucosa clear moist. Neck: no JVD, no Abnormal Mass Or lumps Cardiovascular: S1 and S2 Present, no Murmur, Peripheral Pulses Present Respiratory: Increased respiratory effort, Bilateral Air entry equal and Decreased, no use of accessory muscle, bilateral  Crackles, bilateral  wheezes Abdomen: Bowel Sound present, Soft and no tenderness, no hernia Skin: no redness, no Rash, no induration Extremities: no Pedal edema, no calf tenderness Neurologic: Grossly no focal neuro deficit. Bilaterally Equal motor strength  Data Reviewed: CBC: Recent Labs  Lab 09/05/18 1449 09/05/18 2114 09/06/18 0228 09/07/18 0504 09/08/18 0227 09/09/18 0216  WBC 12.9* 15.7* 13.4* 13.3* 12.9* 12.9*  NEUTROABS 9.9*  --   --   --   --   --   HGB 12.5 11.8* 11.9* 10.1* 10.3* 10.9*  HCT 40.4 37.4 37.3 31.3* 33.1* 34.2*  MCV 91.6 90.6 91.9 91.3 90.7 91.2  PLT 226 231 246 239 232 741   Basic Metabolic Panel: Recent Labs  Lab 09/06/18 1317 09/06/18 1643 09/06/18 2306 09/07/18 0504 09/08/18 0816  NA  137 138 138 140 143  K 3.9 3.8 3.6 3.5 3.5  CL 106 108 110 108 110  CO2 19* 18* 18* 22 23  GLUCOSE 496* 253* 172* 185* 133*  BUN 40* 46* 47* 45* 32*  CREATININE 1.39* 1.52* 1.22* 1.25* 1.18*  CALCIUM 8.5* 8.6* 8.5* 8.0* 9.2    Liver Function Tests: Recent Labs  Lab 09/06/18 0228  AST 49*  ALT 39  ALKPHOS 75  BILITOT 0.9  PROT 6.5  ALBUMIN 3.1*   No results for input(s): LIPASE, AMYLASE in the last 168 hours. No results for input(s): AMMONIA in the last 168 hours. Coagulation Profile: No results for input(s): INR, PROTIME in the last 168 hours. Cardiac Enzymes: No results for input(s): CKTOTAL, CKMB, CKMBINDEX, TROPONINI in the last 168 hours. BNP (last 3 results) No results for input(s): PROBNP in the last 8760 hours. CBG: Recent Labs  Lab 09/08/18 1625 09/08/18 2102 09/09/18 0728 09/09/18 1144  09/09/18 1611  GLUCAP 139* 228* 101* 234* 238*   Studies: Dg Chest Port 1 View  Result Date: 09/09/2018 CLINICAL DATA:  Chest tube, pneumothorax EXAM: PORTABLE CHEST 1 VIEW COMPARISON:  09/08/2018 FINDINGS: Sternotomy wires overlie normal cardiac silhouette. Persistent small LEFT pneumothorax laterally measures 11 mm from the chest wall. Pneumothorax appears decreased in volume compared to 1 day prior. Pigtail catheter along the LEFT lateral chest wall. Small LEFT effusion. RIGHT lung clear IMPRESSION: 1. Decrease in volume of small LEFT pneumothorax with chest tube in place 2. Small LEFT effusion. Electronically Signed   By: Suzy Bouchard M.D.   On: 09/09/2018 07:55     Time spent: 35 minutes  Author: Berle Mull, MD [facility] Pager: [phone] 09/09/2018 4:54 PM  Between 7PM-7AM, please contact night-coverage at [email or web address], password [facility]

## 2018-09-09 NOTE — Care Management Note (Signed)
Case Management Note  Patient Details  Name: [name] MRN: [id number] Date of Birth: Oct 11, 1931  Subjective/Objective:    From home with spouse, a/c resp failure, multifocal pna, copd ex, chronic chf, dka, on iv abx, iv solumedrol,  Pt eval rec HHPT.                Action/Plan: NCM will follow for transition of care needs.   Expected Discharge Date:                  Expected Discharge Plan:  [place]  In-House Referral:     Discharge planning Services  CM Consult  Post Acute Care Choice:    Choice offered to:     DME Arranged:    DME Agency:     HH Arranged:    [place] Agency:     Status of Service:  In process, will continue to follow  If discussed at Long Length of Stay Meetings, dates discussed:    Additional Comments:  Zenon Mayo, RN 09/09/2018, 10:14 AM

## 2018-09-09 NOTE — Progress Notes (Signed)
   NAME:  [name], MRN:  [id number], DOB:  11/21/1931, LOS: 4 ADMISSION DATE:  09/05/2018, CONSULTATION DATE:  09/08/2018 REFERRING MD:  Marlowe Sax MD, CHIEF COMPLAINT:  Lt pneumothorax   Brief History   84 year old with COPD admitted for acute respiratory failure, multifocal pneumonia, DKA Developed spontaneous pneumothorax on 12/22.  PCCM called for management  Past Medical History  COPD, type 2 diabetes, diabetes mellitus, coronary artery disease, GERD, Stage I lung cancer [presumed stage I  She follows with Dr. Chase Caller.  History noted for PET positive, left lower lobe spiculated nodule Discussed at multidisciplinary tumor board and biopsy deferred, Underwent SBRT in April 2019  [place] Hospital Events   12/19 > admit 12/22 Lt spontaneous pneumothorax s/p chest tube placement  Consults:  PCCM 12/22 >  Procedures:  Lt chest tube 12/22 >  Significant Diagnostic Tests:  CT chest 08/28/2018- advanced emphysema, left lower lobe lung nodule with bandlike interstitial airspace opacity suggestive of radiation fibrosis.  Chest x-ray 12/22 > moderate-sized left pneumothorax  CXR 12/23- interval improvement in size of L-sided PTX, however small sized L PTX still present. Pigtail placement unchanged.   12/23 Images personally reviewed  Micro Data:  Blood culture 12/19- NGTD  Antimicrobials:  Ceftriaxone for 7 days 12/19>> Azithromycin for 5 days 12/19>>  Interim history/subjective:  12/23 Chest tube placed 12/22 remains in place, to suction, without air leak. Patient reports better pain management with changes made to pain regimen by primary team  Objective   Blood pressure 128/79, pulse 66, temperature 98.9 F (37.2 C), temperature source Oral, resp. rate 18, height 5' (1.524 m), weight 45.4 kg, SpO2 100 %.        Intake/Output Summary (Last 24 hours) at 09/09/2018 1426 Last data filed at 09/09/2018 0900 Gross per 24 hour  Intake 880 ml  Output 1060 ml  Net -180  ml   Filed Weights   09/05/18 1424  Weight: 45.4 kg   Examination: Gen:      Frail appearing older adult female, NAD HEENT:  Normocephalic, atraumatic, pink mucus membranes Neck:     Trachea midline Lungs:    L sided diminished lung sounds. L sided chest tube. Serosanguinous output, no air leak, -20 suction  CV:         RRR, no r/g/m Abd:      Soft, round, non-distended, non-tender Ext:    Warm, capillary refill < 3 seconds, no pedal edema  Skin:      Clean, warm, dry, intact Neuro: AAOx3, follows commands  Psych: euthymic mood, congruent affect  Resolved Hospital Problem list   DKA  Assessment & Plan:  84 year old with advanced emphysema, lung cancer, AE COPD, PNA, spontaneous left pneumothorax  Left pneumothorax appears to have been precipitated by a bout of coughing Chest tube placed 12/22  Plan Daily CXR Chest tube flush per order set Monitor chest tube output, trend qshift Maintain at -20 suction   Acute on chronic respiratory failure COPD, pneumonia Multifocal PNA appreciated on CXR 09/05/18 Interval improvement per CXRs  Plan Continue Rocephin and Azithromycin Albuterol, tessalon,. Budesonide, mucinex, guaifenesin, anoro ellipta, prednisone   Eliseo Gum MSN, AGACNP-BC 09/09/2018, 2:26 PM

## 2018-09-10 ENCOUNTER — Inpatient Hospital Stay (HOSPITAL_COMMUNITY): Payer: Medicare Other

## 2018-09-10 LAB — GLUCOSE, CAPILLARY
Glucose-Capillary: 110 mg/dL — ABNORMAL HIGH (ref 70–99)
Glucose-Capillary: 205 mg/dL — ABNORMAL HIGH (ref 70–99)
Glucose-Capillary: 206 mg/dL — ABNORMAL HIGH (ref 70–99)
Glucose-Capillary: 278 mg/dL — ABNORMAL HIGH (ref 70–99)

## 2018-09-10 LAB — CULTURE, BLOOD (ROUTINE X 2)
Culture: NO GROWTH
Culture: NO GROWTH
Special Requests: ADEQUATE
Special Requests: ADEQUATE

## 2018-09-10 LAB — CBC
HCT: 33 % — ABNORMAL LOW (ref 36.0–46.0)
Hemoglobin: 10.5 g/dL — ABNORMAL LOW (ref 12.0–15.0)
MCH: 29.2 pg (ref 26.0–34.0)
MCHC: 31.8 g/dL (ref 30.0–36.0)
MCV: 91.9 fL (ref 80.0–100.0)
Platelets: 253 10*3/uL (ref 150–400)
RBC: 3.59 MIL/uL — ABNORMAL LOW (ref 3.87–5.11)
RDW: 13.8 % (ref 11.5–15.5)
WBC: 11 10*3/uL — ABNORMAL HIGH (ref 4.0–10.5)
nRBC: 0 % (ref 0.0–0.2)

## 2018-09-10 NOTE — Progress Notes (Signed)
Physical Therapy Treatment Patient Details Name: [name] MRN: [id number] DOB: [birth year] Today's Date: 09/10/2018    History of Present Illness Patient is a 84 y/o female who presents from PCP office with concern for PNA- reports SOB, cough for a few days. CXR- multifocal PNA. Found to have acute on chronic respiratory failure secondary to COPD and PNA. Also DKA.  L chest tube inserted 09/08/18.  PMH includes COPD, depression, HTN, DM, CAD s/p CABG.    PT Comments    Pt progressing well with gait and mobility.  She was able to walk further today with less assistance overall.  She is down to 3 L O2 [place] (reports 2.5 L is her baseline) and was able to maintain sats in the mid 90s without any DOE.  PT will continue to follow acutely for safe mobility progression   Follow Up Recommendations  Home health PT;Supervision for mobility/OOB     Equipment Recommendations  None recommended by PT    Recommendations for Other Services   NA     Precautions / Restrictions Precautions Precautions: Fall;Other (comment) Precaution Comments: monitor O2 sats with mobility Required Braces or Orthoses: Other Brace Other Brace: L side chest tube Restrictions Weight Bearing Restrictions: No    Mobility  Bed Mobility               General bed mobility comments: Pt seated EOB  Transfers Overall transfer level: Needs assistance Equipment used: Rolling walker (2 wheeled) Transfers: Sit to/from Stand Sit to Stand: Min guard         General transfer comment: Min guard assist for safety  Ambulation/Gait Ambulation/Gait assistance: Supervision Gait Distance (Feet): 170 Feet Assistive device: Rolling walker (2 wheeled) Gait Pattern/deviations: Step-through pattern;Shuffle;Trunk flexed     General Gait Details: Verbal cues for upright posture, slow, shuffling gait, no reports of DOE and when I could get a good wveform on the monitor her sats were in the mid 90s on 3 L O2 [place] (pt  reports she is normally on 2.5 L at home at baseline).            Balance Overall balance assessment: Needs assistance Sitting-balance support: Feet supported;Bilateral upper extremity supported Sitting balance-Leahy Scale: Good     Standing balance support: No upper extremity supported;Bilateral upper extremity supported;Single extremity supported Standing balance-Leahy Scale: Fair                              Cognition Arousal/Alertness: Awake/alert Behavior During Therapy: WFL for tasks assessed/performed Overall Cognitive Status: Within Functional Limits for tasks assessed                                               Pertinent Vitals/Pain Pain Assessment: Faces Faces Pain Scale: Hurts a little bit Pain Location: chest tube site left chest wall Pain Descriptors / Indicators: Grimacing;Guarding Pain Intervention(s): Limited activity within patient's tolerance;Monitored during session;Repositioned           PT Goals (current goals can now be found in the care plan section) Acute Rehab PT Goals Patient Stated Goal: to go home soon.  Breathe better Progress towards PT goals: Progressing toward goals    Frequency    Min 3X/week      PT Plan Current plan remains appropriate  AM-PAC PT "6 Clicks" Mobility   Outcome Measure  Help needed turning from your back to your side while in a flat bed without using bedrails?: None Help needed moving from lying on your back to sitting on the side of a flat bed without using bedrails?: None Help needed moving to and from a bed to a chair (including a wheelchair)?: A Little Help needed standing up from a chair using your arms (e.g., wheelchair or bedside chair)?: A Little Help needed to walk in hospital room?: None Help needed climbing 3-5 steps with a railing? : A Little 6 Click Score: 21    End of Session Equipment Utilized During Treatment: Gait belt;Oxygen(3 L O2 [place]) Activity  Tolerance: Patient tolerated treatment well Patient left: in bed;Other (comment);with family/visitor present(seated EOB, huband in room)   PT Visit Diagnosis: Difficulty in walking, not elsewhere classified (R26.2);Unsteadiness on feet (R26.81);History of falling (Z91.81)     Time: 5396-7289 PT Time Calculation (min) (ACUTE ONLY): 22 min  Charges:  $Gait Training: 8-22 mins                    Kailoni Vahle B. Dannetta Lekas, PT, DPT  Acute Rehabilitation [phone] pager #[phone] office   09/10/2018, 9:39 AM

## 2018-09-10 NOTE — Progress Notes (Signed)
NAME:  [name], MRN:  [id number], DOB:  1932-02-15, LOS: 5 ADMISSION DATE:  09/05/2018, CONSULTATION DATE:  09/08/2018 REFERRING MD:  Marlowe Sax MD, CHIEF COMPLAINT:  Lt pneumothorax   Brief History   84 year old with COPD admitted for acute respiratory failure, multifocal pneumonia, DKA Developed spontaneous pneumothorax on 12/22.  PCCM called for management  Past Medical History  COPD gold stage 2 but severe reducton in dlco (fev may 2019 - 0.8L/60% and dlco 29%), type 2 diabetes, diabetes mellitus, coronary artery disease, GERD, Stage I lung cancer [presumed stage I LLL nodule - empiric XRT ending April 2019).  She follows with Dr. Chase Caller.  History noted for PET positive, left lower lobe spiculated nodule Discussed at multidisciplinary tumor board and biopsy deferred, Underwent SBRT in April 2019  [place] [facility] Events   12/19 > admit 12/22 Lt spontaneous pneumothorax s/p chest tube placement 12/23 Chest tube placed 12/22 remains in place, to suction, without air leak. Patient reports better pain management with changes made to pain regimen by primary team  Consults:  PCCM 12/22 >  Procedures:  Lt chest tube 12/22 >  Significant Diagnostic Tests:  CT chest 08/28/2018- advanced emphysema, left lower lobe lung nodule with bandlike interstitial airspace opacity suggestive of radiation fibrosis.  Chest x-ray 12/22 > moderate-sized left pneumothorax  CXR 12/23- interval improvement in size of L-sided PTX, however small sized L PTX still present. Pigtail placement unchanged.   12/23 Images personally reviewed  Micro Data:  Blood culture 12/19- NGTD  Antimicrobials:  Ceftriaxone for 7 days 12/19>> Azithromycin for 5 days 12/19>>  Interim history/subjective:    12/24 - reduced pneumothorax to 5%. On o2. Frustrsated by slow progress but no acute complatings    Objective   Blood pressure (!) 166/65, pulse 60, temperature 97.7 F (36.5 C), temperature source  Oral, resp. rate 19, height 5' (1.524 m), weight 45.4 kg, SpO2 93 %.        Intake/Output Summary (Last 24 hours) at 09/10/2018 1528 Last data filed at 09/10/2018 1235 Gross per 24 hour  Intake 240 ml  Output 8 ml  Net 232 ml   Filed Weights   09/05/18 1424  Weight: 45.4 kg    General Appearance: lean, looks well Head:  Normocephalic, without obvious abnormality, atraumatic Eyes:  PERRL - yes, conjunctiva/corneas - clear     Ears:  Normal external ear canals, both ears Nose:  G tube - no but has [place] o2 Throat:  ETT TUBE - no , OG tube - no Neck:  Supple,  No enlargement/tenderness/nodules Lungs: Clear to auscultation bilaterally, [place] chest LEFT CHEST TUBE + Heart:  S1 and S2 normal, no murmur, CVP - no.  Pressors - no Abdomen:  Soft, no masses, no organomegaly Genitalia / Rectal:  Not done Extremities:  Extremities- intact Skin:  ntact in exposed areas .  Neurologic: Moves all 4s - yes. CAM-ICU - neg . Orientation - x3+     LABS    PULMONARY Recent Labs  Lab 09/05/18 1609  HCO3 24.2  TCO2 25  O2SAT 68.0    CBC Recent Labs  Lab 09/08/18 0227 09/09/18 0216 09/10/18 0234  HGB 10.3* 10.9* 10.5*  HCT 33.1* 34.2* 33.0*  WBC 12.9* 12.9* 11.0*  PLT 232 256 253    COAGULATION No results for input(s): INR in the last 168 hours.  CARDIAC  No results for input(s): TROPONINI in the last 168 hours. No results for input(s): PROBNP in the last 168 hours.  CHEMISTRY Recent Labs  Lab 09/06/18 1317 09/06/18 1643 09/06/18 2306 09/07/18 0504 09/08/18 0816  NA 137 138 138 140 143  K 3.9 3.8 3.6 3.5 3.5  CL 106 108 110 108 110  CO2 19* 18* 18* 22 23  GLUCOSE 496* 253* 172* 185* 133*  BUN 40* 46* 47* 45* 32*  CREATININE 1.39* 1.52* 1.22* 1.25* 1.18*  CALCIUM 8.5* 8.6* 8.5* 8.0* 9.2   Estimated Creatinine Clearance: 25.9 mL/min (A) (by C-G formula based on SCr of 1.18 mg/dL (H)).   LIVER Recent Labs  Lab 09/06/18 0228  AST 49*  ALT 39  ALKPHOS 75   BILITOT 0.9  PROT 6.5  ALBUMIN 3.1*     INFECTIOUS Recent Labs  Lab 09/06/18 1040  LATICACIDVEN 0.9     ENDOCRINE CBG (last 3)  Recent Labs    09/09/18 2059 09/10/18 0757 09/10/18 1124  GLUCAP 130* 110* 278*         IMAGING x48h  - image(s) personally visualized  -   highlighted in bold Dg Chest Port 1 View  Result Date: 09/10/2018 CLINICAL DATA:  Followup left pneumothorax and chest tube. EXAM: PORTABLE CHEST 1 VIEW COMPARISON:  Yesterday. FINDINGS: Skin folds are demonstrated on the left. Significant reduction in the left pneumothorax with a less than 5% apical pneumothorax remaining. A small caliber left chest tube remains in place. No significant change in a small left pleural effusion and bibasilar atelectasis or pneumonia. Mild-to-moderate peribronchial thickening is unchanged. Diffuse osteopenia. IMPRESSION: 1. Significant reduction in the left pneumothorax with a less than 5% apical pneumothorax remaining. 2. Stable small left pleural effusion and bibasilar atelectasis or pneumonia. 3. Stable mild to moderate bronchitic changes. Electronically Signed   By: Claudie Revering M.D.   On: 09/10/2018 08:24   Dg Chest Port 1 View  Result Date: 09/09/2018 CLINICAL DATA:  Chest tube, pneumothorax EXAM: PORTABLE CHEST 1 VIEW COMPARISON:  09/08/2018 FINDINGS: Sternotomy wires overlie normal cardiac silhouette. Persistent small LEFT pneumothorax laterally measures 11 mm from the chest wall. Pneumothorax appears decreased in volume compared to 1 day prior. Pigtail catheter along the LEFT lateral chest wall. Small LEFT effusion. RIGHT lung clear IMPRESSION: 1. Decrease in volume of small LEFT pneumothorax with chest tube in place 2. Small LEFT effusion. Electronically Signed   By: Suzy Bouchard M.D.   On: 09/09/2018 07:55     Resolved Hospital Problem list   DKA  Assessment & Plan:  83 year old with advanced emphysema, lung cancer =- presumed LLL and empiric XRT April 2019 AE  COPD, PNA, spontaneous left pneumothorax  Left pneumothorax appears to have been precipitated by a bout of coughing but underlying risk factor of hx of ipsilateral XRT April 2019 and COPD with severe dlco reduction.    12/22 - Chest tube placed   12/25 - improved PTX with suction for 48h,.She is at high risk for non-healing and recurrence due to underlying age + risk factors    Plan Maintain suction through 09/11/18 - total 72h and then do 24h off suction and lung still up on 09/12/18 - > clamp for 4-8h -> if still up remove chest tube  Discussed recurrence risk - she does not want surgery at this point  Continue o2  At some point might need repeat CT   Acute on chronic respiratory failure COPD, pneumonia Multifocal PNA appreciated on CXR 09/05/18 Interval improvement per CXRs  Plan Continue Rocephin and Azithromycin per [facility] Albuterol, tessalon,. Budesonide, mucinex, guaifenesin, anoro ellipta, prednisone  Ccm will follow   SIGNATURE    Dr. Brand Males, M.D., F.C.C.P,  Pulmonary and Critical Care Medicine Staff Physician, [place] Director - Interstitial Lung Disease  Program  Pulmonary [place] [facility] [place], [place], [address]  Pager: [phone], If no answer or between  15:00h - 7:00h: call [phone] Telephone: [phone]  3:50 PM 09/10/2018

## 2018-09-10 NOTE — Progress Notes (Signed)
[facility] Progress Note  Patient: [name] ZWC:[id number]   PCP: Harlan Stains, MD DOB: Jul 20, 1932   DOA: 09/05/2018   DOS: 09/10/2018   Date of Service: the patient was seen and examined on 09/10/2018  Brief hospital course: Pt. with PMH of COPD, type II DM, malnutrition; admitted on 09/05/2018, presented with complaint of shortness of breath, was found to have COPD exacerbation with multifocal pneumonia and DKA. Currently further plan is continue current management.  Subjective: Breathing okay, still has cough.  No nausea no vomiting.  Pain is well controlled.  Constipation resolved.  Assessment and Plan: 1.  Acute on chronic hypoxic respiratory failure. Multifocal pneumonia. COPD exacerbation. Acute pneumothorax Comes with cough and shortness of breath.   Initial chest x-ray shows left lower lobe pneumonia as well as right lower lobe pneumonia. Started on IV antibiotics, then IV ceftriaxone, oral azithromycin. blood cultures So far negative. Strep pneumo negative Started on IV Solu-Medrol,  transition to oral prednisone and oral antibiotics. Chest x-ray on 09/09/2018 shows acute pneumothorax.   Pulmonary was consulted and patient underwent chest tube placement.  Continue pain control with oral Percocet.  Appreciate pulmonary assistance.  Chest tube still in the suction.  2.  DKA. Type 2 diabetes mellitus, uncontrolled with hyperglycemia without any complication. High anion gap metabolic acidosis. Lactic acid is normal. Beta hydroxybutyrate acid is elevated. Patient is hyperglycemic as well. Does have history of type 2 diabetes mellitus and recently taken off of her metformin. Likely steroid-induced hyperglycemia causing some metabolic acidosis. Patient was given IV fluid bolus as well as subcutaneous insulin with which her blood sugar did not improved. Patient was placed on DKA treatment protocol with IV insulin, IV fluids, n.p.o., BMP every 4 hours and CBG 1  hour. Anion gap is closed. Now transitioned to subcutaneous insulin, continue sliding scale insulin. On discharge patient will require sulfonylurea for short-term to cover for steroid-induced hyperglycemia, along with metformin.  3.  Accelerated hypertension. Chronic diastolic CHF Blood pressure significantly elevated. Patient is on amlodipine, clonidine, Lasix, Avapro at home. Currently with AKI and DKA holding Lasix and Avapro.  Continue amlodipine and clonidine. Currently not volume overloaded  4.  Mood disorder. Continue home regimen for now.  5.  Dyslipidemia. Continue Pravachol.  6. Malnutrition Nutrition Problem: Severe Malnutrition Etiology: chronic illness Interventions: Interventions: Glucerna shake Monitor.  Diet: Carb modified diet  DVT Prophylaxis: subcutaneous Heparin  Advance goals of care discussion: Full code  Family Communication: family was present at bedside, at the time of interview. The pt provided permission to discuss medical plan with the family. Opportunity was given to ask question and all questions were answered satisfactorily.   Disposition:  Discharge to home  Consultants: None Procedures: None  Scheduled Meds: . amLODipine  10 mg Oral Daily  . aspirin EC  81 mg Oral QHS  . azithromycin  500 mg Oral Daily  . benzonatate  100 mg Oral TID  . budesonide (PULMICORT) nebulizer solution  0.5 mg Nebulization BID  . buPROPion  150 mg Oral Daily  . cholecalciferol  1,000 Units Oral QHS  . cloNIDine  0.1 mg Oral Q8H  . dextromethorphan-guaiFENesin  1 tablet Oral BID  . enoxaparin (LOVENOX) injection  30 mg Subcutaneous Q24H  . feeding supplement (GLUCERNA SHAKE)  237 mL Oral TID BM  . ferrous sulfate  325 mg Oral BID  . insulin aspart  0-20 Units Subcutaneous TID WC  . insulin aspart  0-5 Units Subcutaneous QHS  . insulin aspart  3 Units Subcutaneous TID WC  . insulin glargine  8 Units Subcutaneous Daily  . magnesium oxide  200 mg Oral Daily    . pravastatin  20 mg Oral QHS  . predniSONE  50 mg Oral Q breakfast  . sertraline  100 mg Oral q morning - 10a  . umeclidinium-vilanterol  1 puff Inhalation Daily   Continuous Infusions: . cefTRIAXone (ROCEPHIN)  IV 1 g (09/09/18 2201)   PRN Meds: acetaminophen, albuterol, antiseptic oral rinse, guaiFENesin, hydrALAZINE, menthol-cetylpyridinium, oxyCODONE-acetaminophen **AND** oxyCODONE Antibiotics: Anti-infectives (From admission, onward)   Start     Dose/Rate Route Frequency Ordered Stop   09/08/18 2000  azithromycin (ZITHROMAX) tablet 500 mg     500 mg Oral Daily 09/08/18 0809 09/13/18 1959   09/06/18 2100  cefTRIAXone (ROCEPHIN) 1 g in sodium chloride 0.9 % 100 mL IVPB     1 g 200 mL/hr over 30 Minutes Intravenous Every 24 hours 09/05/18 2058 09/13/18 2059   09/06/18 2000  azithromycin (ZITHROMAX) 500 mg in sodium chloride 0.9 % 250 mL IVPB  Status:  Discontinued     500 mg 250 mL/hr over 60 Minutes Intravenous Every 24 hours 09/05/18 2058 09/08/18 0809   09/05/18 1800  cefTRIAXone (ROCEPHIN) 1 g in sodium chloride 0.9 % 100 mL IVPB     1 g 200 mL/hr over 30 Minutes Intravenous  Once 09/05/18 1748 09/05/18 1953   09/05/18 1800  azithromycin (ZITHROMAX) 500 mg in sodium chloride 0.9 % 250 mL IVPB     500 mg 250 mL/hr over 60 Minutes Intravenous  Once 09/05/18 1748 09/05/18 2012       Objective: Physical Exam: Vitals:   09/10/18 0933 09/10/18 1100 09/10/18 1200 09/10/18 1538  BP:    (!) 143/70  Pulse:  (!) 59 60 (!) 55  Resp:  18 19 16   Temp:    97.9 F (36.6 C)  TempSrc:    Oral  SpO2: 94% 95% 93% 97%  Weight:      Height:        Intake/Output Summary (Last 24 hours) at 09/10/2018 1733 Last data filed at 09/10/2018 1235 Gross per 24 hour  Intake 240 ml  Output 8 ml  Net 232 ml   Filed Weights   09/05/18 1424  Weight: 45.4 kg   General: Alert, Awake and Oriented to Time, Place and Person. Appear in moderate distress, affect appropriate Eyes: PERRL,  Conjunctiva normal ENT: Oral Mucosa clear moist. Neck: no JVD, no Abnormal Mass Or lumps Cardiovascular: S1 and S2 Present, no Murmur, Peripheral Pulses Present Respiratory: Increased respiratory effort, Bilateral Air entry equal and Decreased, no use of accessory muscle, bilateral  Crackles, bilateral  wheezes Abdomen: Bowel Sound present, Soft and no tenderness, no hernia Skin: no redness, no Rash, no induration Extremities: no Pedal edema, no calf tenderness Neurologic: Grossly no focal neuro deficit. Bilaterally Equal motor strength  Data Reviewed: CBC: Recent Labs  Lab 09/05/18 1449  09/06/18 0228 09/07/18 0504 09/08/18 0227 09/09/18 0216 09/10/18 0234  WBC 12.9*   < > 13.4* 13.3* 12.9* 12.9* 11.0*  NEUTROABS 9.9*  --   --   --   --   --   --   HGB 12.5   < > 11.9* 10.1* 10.3* 10.9* 10.5*  HCT 40.4   < > 37.3 31.3* 33.1* 34.2* 33.0*  MCV 91.6   < > 91.9 91.3 90.7 91.2 91.9  PLT 226   < > 246 239 232 256 253   < > =  values in this interval not displayed.   Basic Metabolic Panel: Recent Labs  Lab 09/06/18 1317 09/06/18 1643 09/06/18 2306 09/07/18 0504 09/08/18 0816  NA 137 138 138 140 143  K 3.9 3.8 3.6 3.5 3.5  CL 106 108 110 108 110  CO2 19* 18* 18* 22 23  GLUCOSE 496* 253* 172* 185* 133*  BUN 40* 46* 47* 45* 32*  CREATININE 1.39* 1.52* 1.22* 1.25* 1.18*  CALCIUM 8.5* 8.6* 8.5* 8.0* 9.2    Liver Function Tests: Recent Labs  Lab 09/06/18 0228  AST 49*  ALT 39  ALKPHOS 75  BILITOT 0.9  PROT 6.5  ALBUMIN 3.1*   No results for input(s): LIPASE, AMYLASE in the last 168 hours. No results for input(s): AMMONIA in the last 168 hours. Coagulation Profile: No results for input(s): INR, PROTIME in the last 168 hours. Cardiac Enzymes: No results for input(s): CKTOTAL, CKMB, CKMBINDEX, TROPONINI in the last 168 hours. BNP (last 3 results) No results for input(s): PROBNP in the last 8760 hours. CBG: Recent Labs  Lab 09/09/18 1611 09/09/18 2059  09/10/18 0757 09/10/18 1124 09/10/18 1623  GLUCAP 238* 130* 110* 278* 206*   Studies: Dg Chest Port 1 View  Result Date: 09/10/2018 CLINICAL DATA:  Followup left pneumothorax and chest tube. EXAM: PORTABLE CHEST 1 VIEW COMPARISON:  Yesterday. FINDINGS: Skin folds are demonstrated on the left. Significant reduction in the left pneumothorax with a less than 5% apical pneumothorax remaining. A small caliber left chest tube remains in place. No significant change in a small left pleural effusion and bibasilar atelectasis or pneumonia. Mild-to-moderate peribronchial thickening is unchanged. Diffuse osteopenia. IMPRESSION: 1. Significant reduction in the left pneumothorax with a less than 5% apical pneumothorax remaining. 2. Stable small left pleural effusion and bibasilar atelectasis or pneumonia. 3. Stable mild to moderate bronchitic changes. Electronically Signed   By: Claudie Revering M.D.   On: 09/10/2018 08:24     Time spent: 35 minutes  Author: Berle Mull, MD [facility] Pager: [phone] 09/10/2018 5:33 PM  Between 7PM-7AM, please contact night-coverage at [email or web address], password [facility]

## 2018-09-10 NOTE — Plan of Care (Signed)

## 2018-09-11 ENCOUNTER — Inpatient Hospital Stay (HOSPITAL_COMMUNITY): Payer: Medicare Other

## 2018-09-11 LAB — GLUCOSE, CAPILLARY
Glucose-Capillary: 91 mg/dL (ref 70–99)
Glucose-Capillary: 73 mg/dL (ref 70–99)
Glucose-Capillary: 242 mg/dL — ABNORMAL HIGH (ref 70–99)
Glucose-Capillary: 270 mg/dL — ABNORMAL HIGH (ref 70–99)

## 2018-09-11 LAB — CBC
HCT: 33.3 % — ABNORMAL LOW (ref 36.0–46.0)
Hemoglobin: 10.6 g/dL — ABNORMAL LOW (ref 12.0–15.0)
MCH: 29 pg (ref 26.0–34.0)
MCHC: 31.8 g/dL (ref 30.0–36.0)
MCV: 91 fL (ref 80.0–100.0)
Platelets: 291 10*3/uL (ref 150–400)
RBC: 3.66 MIL/uL — AB (ref 3.87–5.11)
RDW: 13.6 % (ref 11.5–15.5)
WBC: 13.7 10*3/uL — ABNORMAL HIGH (ref 4.0–10.5)
nRBC: 0 % (ref 0.0–0.2)

## 2018-09-11 LAB — MAGNESIUM: Magnesium: 1.9 mg/dL (ref 1.7–2.4)

## 2018-09-11 LAB — PHOSPHORUS: Phosphorus: 4.2 mg/dL (ref 2.5–4.6)

## 2018-09-11 MED ORDER — HYDRALAZINE HCL 20 MG/ML IJ SOLN
INTRAMUSCULAR | Status: AC
  Filled 2018-09-11: qty 1

## 2018-09-11 MED ORDER — HYDRALAZINE HCL 20 MG/ML IJ SOLN
10.0000 mg | Freq: Once | INTRAMUSCULAR | Status: AC
  Administered 2018-09-11: 10 mg via INTRAVENOUS

## 2018-09-11 MED ORDER — IRBESARTAN 150 MG PO TABS
150.0000 mg | ORAL_TABLET | Freq: Every day | ORAL | Status: DC
  Administered 2018-09-11 – 2018-09-12 (×2): 150 mg via ORAL
  Filled 2018-09-11 (×2): qty 1

## 2018-09-11 NOTE — Progress Notes (Signed)
NAME:  GLENDOLA FRIEDHOFF, MRN:  [id number], DOB:  01-18-1932, LOS: 6 ADMISSION DATE:  09/05/2018, CONSULTATION DATE:  09/08/2018 REFERRING MD:  Marlowe Sax MD, CHIEF COMPLAINT:  Lt pneumothorax   Brief History   84 year old with COPD admitted for acute respiratory failure, multifocal pneumonia, DKA Developed spontaneous pneumothorax on 12/22.  PCCM called for management  Past Medical History  COPD gold stage 2 but severe reducton in dlco (fev may 2019 - 0.8L/60% and dlco 29%), type 2 diabetes, diabetes mellitus, coronary artery disease, GERD, Stage I lung cancer [presumed stage I LLL nodule - empiric XRT ending April 2019).  She follows with Dr. Chase Caller.  History noted for PET positive, left lower lobe spiculated nodule Discussed at multidisciplinary tumor board and biopsy deferred, Underwent SBRT in April 2019  [place] [facility] Events   12/19 > admit 12/22 Lt spontaneous pneumothorax s/p chest tube placement 12/23 Chest tube placed 12/22 remains in place, to suction, without air leak. Patient reports better pain management with changes made to pain regimen by primary team 12/25 change chest tube to water seal  Consults:  PCCM 12/22 >  Procedures:  Lt chest tube 12/22 >  Significant Diagnostic Tests:  CT chest 08/28/2018- advanced emphysema, left lower lobe lung nodule with bandlike interstitial airspace opacity suggestive of radiation fibrosis.  Chest x-ray 12/22 > moderate-sized left pneumothorax  CXR 12/23- interval improvement in size of L-sided PTX, however small sized L PTX still present. Pigtail placement unchanged.   Micro Data:  Blood culture 12/19- NGTD  Antimicrobials:  Ceftriaxone for 7 days 12/19>> Azithromycin for 5 days 12/19>>  Interim history/subjective:  Has cough.  Not having wheeze or sputum.  Objective   Blood pressure (!) 136/115, pulse (!) 58, temperature 98.3 F (36.8 C), temperature source Oral, resp. rate 18, height 5' (1.524 m), weight 45.4 kg,  SpO2 99 %.        Intake/Output Summary (Last 24 hours) at 09/11/2018 1022 Last data filed at 09/10/2018 1700 Gross per 24 hour  Intake 490 ml  Output -  Net 490 ml   Filed Weights   09/05/18 1424  Weight: 45.4 kg     General - alert Eyes - pupils reactive ENT - no sinus tenderness, no stridor Cardiac - regular rate/rhythm, no murmur Chest - decreased BS, no wheeze, Lt chest tube w/o air leak Abdomen - soft, non tender, + bowel sounds Extremities - no cyanosis, clubbing, or edema Skin - no rashes Neuro - normal strength, moves extremities, follows commands Psych - normal mood and behavior   LABS   CMP Latest Ref Rng & Units 09/08/2018 09/07/2018 09/06/2018  Glucose 70 - 99 mg/dL 133(H) 185(H) 172(H)  BUN 8 - 23 mg/dL 32(H) 45(H) 47(H)  Creatinine 0.44 - 1.00 mg/dL 1.18(H) 1.25(H) 1.22(H)  Sodium 135 - 145 mmol/L 143 140 138  Potassium 3.5 - 5.1 mmol/L 3.5 3.5 3.6  Chloride 98 - 111 mmol/L 110 108 110  CO2 22 - 32 mmol/L 23 22 18(L)  Calcium 8.9 - 10.3 mg/dL 9.2 8.0(L) 8.5(L)  Total Protein 6.5 - 8.1 g/dL - - -  Total Bilirubin 0.3 - 1.2 mg/dL - - -  Alkaline Phos 38 - 126 U/L - - -  AST 15 - 41 U/L - - -  ALT 0 - 44 U/L - - -   CBC Latest Ref Rng & Units 09/11/2018 09/10/2018 09/09/2018  WBC 4.0 - 10.5 K/uL 13.7(H) 11.0(H) 12.9(H)  Hemoglobin 12.0 - 15.0 g/dL 10.6(L) 10.5(L) 10.9(L)  Hematocrit 36.0 - 46.0 % 33.3(L) 33.0(L) 34.2(L)  Platelets 150 - 400 K/uL 291 253 256   CBG (last 3)  Recent Labs    09/10/18 1623 09/10/18 2341 09/11/18 0716  GLUCAP 206* 205* 91     Resolved Hospital Problem list   DKA  Assessment & Plan:  84 year old with advanced emphysema, lung cancer =- presumed LLL and empiric XRT April 2019 AE COPD, PNA, spontaneous left pneumothorax  Left spontaneous pneumothorax. Likely triggered by cough in setting of COPD with emphysema and severe DLCO reduction.  Has ipsilateral XRT in April 2019 for presumed lung cancer.  12/22 - Chest  tube placed   12/24 - decreased size of pneumothorax  Plan - change chest tube to water seal 12/25 and f/u CXR in PM of 12/25 - if no recurrence of PTX then clamp chest tube 12/26 and f/u CXR with eventually plan to d/c chest tube  Acute on chronic respiratory failure COPD, pneumonia Multifocal PNA appreciated on CXR 09/05/18 Interval improvement per CXRs  Plan - continue ABx per primary team - continue anoro, budesonide, prednsione - bronchial hygiene   SIGNATURE   Chesley Mires, MD [place] 09/11/2018, 10:28 AM

## 2018-09-11 NOTE — Progress Notes (Signed)
[facility]                                                                              Patient Demographics  [name], is a 84 y.o. female, DOB - [birth year], MOQ:[id number]  Admit date - 09/05/2018   Admitting Physician Elwyn Reach, MD  Outpatient Primary MD for the patient is Harlan Stains, MD  Outpatient specialists:   LOS - 6  days   Medical records reviewed and are as summarized below:    Chief Complaint  Patient presents with  . Shortness of Breath       Brief summary   Patient is 84 year old female with severe reduction in DLCO, COPD,  type II DM, malnutrition, stage I lung cancer presumed stage I LLL nodule, empiric XRT ended in 12/2017 was admitted on 09/05/2018, presented with complaint of shortness of breath, was found to have COPD exacerbation with multifocal pneumonia and DKA.  Patient developed spontaneous pneumothorax on 12/22 status post chest tube.  Assessment & Plan   Principal problem Acute on chronic respiratory failure with hypoxia secondary to multifocal pneumonia, COPD exacerbation, acute pneumothorax in the setting of severe reduction in DLCO, stage I lung CA -Patient actually presented with shortness of breath, coughing, initial chest x-ray showed bilateral lower lobe pneumonia -Patient was started on IV antibiotics, Zithromax and IV Rocephin. -Urine strep and antigen negative, blood cultures negative.  She was also started on IV Solu-Medrol, subsequently transitioned to oral prednisone and oral antibiotics -Patient developed spontaneous left pneumothorax.  Pulmonology was consulted, underwent chest tube placement. -Continue pain control -Pulmonology following, chest x-ray today, if no recurrence of pneumothorax, plan to clamp chest tube on 12/26   Type 2 diabetes mellitus, uncontrolled with hyperglycemia, DKA -CBGs worsened secondary to steroids, subsequently causing high anion gap metabolic acidosis.  Lactic acid was  normal.  Patient has a history of type 2 diabetes mellitus and was recently taken off of for metformin. -Patient was placed on DKA protocol with IV fluids, IV insulin -Now has been transitioned to subcutaneous insulin -Patient will need glipizide or Amaryl at discharge for short-term to cover for steroid-induced hyperglycemia along with metformin  Chronic diastolic CHF -Euvolemic, Initially Lasix and Avapro were held due to acute kidney injury -Continue to hold Lasix for now, Avapro restarted due to accelerated hypertension  Accelerated hypertension At the time of my encounter, SBP in 200s -Given hydralazine 10 mg IV x1, continue amlodipine, clonidine, restart Avapro.  Dyslipidemia Continue Pravachol   Severe protein calorie malnutrition Dietitian consulted, continue nutritional supplement  Code Status: Full code DVT Prophylaxis:  Lovenox Family Communication: Discussed in detail with the patient, all imaging results, lab results explained to the patient, alert and oriented x4.   Disposition Plan: Disposition planning after chest tube is removed  Time Spent in minutes 35 minutes  Procedures:  Chest tube placement on 12/22  Consultants:   Pulmonary critical care  Antimicrobials:      Medications  Scheduled Meds: . amLODipine  10 mg Oral Daily  . aspirin EC  81 mg Oral QHS  . azithromycin  500 mg Oral Daily  . benzonatate  100 mg Oral TID  . budesonide (PULMICORT) nebulizer solution  0.5 mg Nebulization BID  . buPROPion  150 mg Oral Daily  . cholecalciferol  1,000 Units Oral QHS  . cloNIDine  0.1 mg Oral Q8H  . dextromethorphan-guaiFENesin  1 tablet Oral BID  . enoxaparin (LOVENOX) injection  30 mg Subcutaneous Q24H  . feeding supplement (GLUCERNA SHAKE)  237 mL Oral TID BM  . ferrous sulfate  325 mg Oral BID  . hydrALAZINE      . insulin aspart  0-20 Units Subcutaneous TID WC  . insulin aspart  0-5 Units Subcutaneous QHS  . insulin aspart  3 Units Subcutaneous  TID WC  . insulin glargine  8 Units Subcutaneous Daily  . irbesartan  150 mg Oral Daily  . magnesium oxide  200 mg Oral Daily  . pravastatin  20 mg Oral QHS  . predniSONE  50 mg Oral Q breakfast  . sertraline  100 mg Oral q morning - 10a  . umeclidinium-vilanterol  1 puff Inhalation Daily   Continuous Infusions: . cefTRIAXone (ROCEPHIN)  IV Stopped (09/11/18 0700)   PRN Meds:.acetaminophen, albuterol, antiseptic oral rinse, guaiFENesin, hydrALAZINE, menthol-cetylpyridinium, oxyCODONE-acetaminophen **AND** oxyCODONE   Antibiotics   Anti-infectives (From admission, onward)   Start     Dose/Rate Route Frequency Ordered Stop   09/08/18 2000  azithromycin (ZITHROMAX) tablet 500 mg     500 mg Oral Daily 09/08/18 0809 09/13/18 1959   09/06/18 2100  cefTRIAXone (ROCEPHIN) 1 g in sodium chloride 0.9 % 100 mL IVPB     1 g 200 mL/hr over 30 Minutes Intravenous Every 24 hours 09/05/18 2058 09/13/18 2059   09/06/18 2000  azithromycin (ZITHROMAX) 500 mg in sodium chloride 0.9 % 250 mL IVPB  Status:  Discontinued     500 mg 250 mL/hr over 60 Minutes Intravenous Every 24 hours 09/05/18 2058 09/08/18 0809   09/05/18 1800  cefTRIAXone (ROCEPHIN) 1 g in sodium chloride 0.9 % 100 mL IVPB     1 g 200 mL/hr over 30 Minutes Intravenous  Once 09/05/18 1748 09/05/18 1953   09/05/18 1800  azithromycin (ZITHROMAX) 500 mg in sodium chloride 0.9 % 250 mL IVPB     500 mg 250 mL/hr over 60 Minutes Intravenous  Once 09/05/18 1748 09/05/18 2012        Subjective:   [name] was seen and examined today.  BP elevated in 200s at the time of my encounter 203/71.  Currently no chest pain.  Still on chest tube.  Shortness of breath is improving.  Patient denies dizziness,  abdominal pain, N/V/D/C, new weakness, numbess, tingling.  No fever  Objective:   Vitals:   09/11/18 0718 09/11/18 0734 09/11/18 0738 09/11/18 0853  BP:  (!) 203/71  (!) 136/115  Pulse: (!) 59  (!) 58   Resp: 18  18   Temp: 98.3 F  (36.8 C)     TempSrc: Oral     SpO2: 100%  99%   Weight:      Height:        Intake/Output Summary (Last 24 hours) at 09/11/2018 1040 Last data filed at 09/10/2018 1700 Gross per 24 hour  Intake 490 ml  Output -  Net 490 ml     Wt Readings from Last 3 Encounters:  09/05/18 45.4 kg  08/12/18 45.2 kg  08/08/18 46 kg     Exam  General: Alert and oriented x 3, NAD  Eyes:   HEENT:  Atraumatic, normocephalic  Cardiovascular:  S1 S2 auscultated Regular rate and rhythm.  Respiratory: Decreased breath sound at the bases, no wheezing, left chest tube  Gastrointestinal: Soft, nontender, nondistended, + bowel sounds  Ext: no pedal edema bilaterally  Neuro: No new deficits  Musculoskeletal: No digital cyanosis, clubbing  Skin: No rashes  Psych: Normal affect and demeanor, alert and oriented x3    Data Reviewed:  I have personally reviewed following labs and imaging studies  Micro Results Recent Results (from the past 240 hour(s))  Culture, blood (routine x 2) Call MD if unable to obtain prior to antibiotics being given     Status: None   Collection Time: 09/05/18  9:00 PM  Result Value Ref Range Status   Specimen Description BLOOD RIGHT HAND  Final   Special Requests   Final    BOTTLES DRAWN AEROBIC ONLY Blood Culture adequate volume   Culture   Final    NO GROWTH 5 DAYS Performed at [facility], [address], [place], [place] [address]    Report Status 09/10/2018 FINAL  Final  Culture, blood (routine x 2) Call MD if unable to obtain prior to antibiotics being given     Status: None   Collection Time: 09/05/18  9:14 PM  Result Value Ref Range Status   Specimen Description BLOOD LEFT HAND  Final   Special Requests   Final    BOTTLES DRAWN AEROBIC ONLY Blood Culture adequate volume   Culture   Final    NO GROWTH 5 DAYS Performed at [facility], [place] [address] [place], [place] [address]    Report Status 09/10/2018 FINAL  Final    Radiology  Reports Dg Chest 2 View  Result Date: 09/05/2018 CLINICAL DATA:  Productive cough and shortness of breath for the past 2-3 days. Clinical stage I non-small cell lung cancer in the left lower lobe. EXAM: CHEST - 2 VIEW COMPARISON:  Chest radiographs dated 07/01/2018. Chest CT dated 08/28/2018. FINDINGS: Normal sized heart. Significantly increase left lower airspace opacity. Small left pleural effusion, increased. Stable hyperexpansion of lungs and mild patchy opacity at the right lung base. Stable mild right pleural thickening. Mild thoracic spine degenerative changes. IMPRESSION: 1. Significantly increased left lower lobe pneumonia. 2. Small left pleural effusion, increased. 3. Stable mild pneumonia or postradiation changes at the right lung base. 4. Stable mild changes of COPD. Electronically Signed   By: Claudie Revering M.D.   On: 09/05/2018 16:15   Ct Chest Wo Contrast  Result Date: 08/28/2018 CLINICAL DATA:  Presumptive clinical stage I non-small cell lung cancer of the left lower lung. EXAM: CT CHEST WITHOUT CONTRAST TECHNIQUE: Multidetector CT imaging of the chest was performed following the standard protocol without IV contrast. COMPARISON:  02/12/2018 FINDINGS: Cardiovascular: The heart size is normal. No substantial pericardial effusion. Coronary artery calcification is evident. Atherosclerotic calcification is noted in the wall of the thoracic aorta. Patient is status post CABG. Right main pulmonary artery measures 3.1 cm diameter. Mediastinum/Nodes: No mediastinal lymphadenopathy. No evidence for gross hilar lymphadenopathy although assessment is limited by the lack of intravenous contrast on today's study. The esophagus has normal imaging features. Moderate hiatal hernia noted. There is no axillary lymphadenopathy. Lungs/Pleura: The central tracheobronchial airways are patent. Moderate to advanced emphysema with bullous change in the upper lobes. 15 x 11 mm peripheral left lower lobe nodule seen  previously has become incorporated into a bandlike area of interstitial and airspace opacity in the peripheral left lower lobe, suggesting evolving radiation  fibrosis. There is a nodular component today measuring 15 x 20 mm on 118/7. Trace left pleural effusion. Upper Abdomen: Unremarkable. Musculoskeletal: No worrisome lytic or sclerotic osseous abnormality. T12 superior endplate compression fracture is similar to prior. IMPRESSION: 1. Interval increase in bandlike opacity in the peripheral left lower lobe, incorporating the irregular nodule seen on the prior study. Imaging features most likely reflect changes from radiation therapy. 2. Prominence of the pulmonary arteries raises the question of pulmonary arterial hypertension. 3. Moderate to advanced Emphysema. (ICD10-J43.9) 4.  Aortic Atherosclerois (ICD10-170.0) 5. Moderate hiatal hernia. Electronically Signed   By: Misty Stanley M.D.   On: 08/28/2018 15:39   Dg Chest Port 1 View  Result Date: 09/10/2018 CLINICAL DATA:  Followup left pneumothorax and chest tube. EXAM: PORTABLE CHEST 1 VIEW COMPARISON:  Yesterday. FINDINGS: Skin folds are demonstrated on the left. Significant reduction in the left pneumothorax with a less than 5% apical pneumothorax remaining. A small caliber left chest tube remains in place. No significant change in a small left pleural effusion and bibasilar atelectasis or pneumonia. Mild-to-moderate peribronchial thickening is unchanged. Diffuse osteopenia. IMPRESSION: 1. Significant reduction in the left pneumothorax with a less than 5% apical pneumothorax remaining. 2. Stable small left pleural effusion and bibasilar atelectasis or pneumonia. 3. Stable mild to moderate bronchitic changes. Electronically Signed   By: Claudie Revering M.D.   On: 09/10/2018 08:24   Dg Chest Port 1 View  Result Date: 09/09/2018 CLINICAL DATA:  Chest tube, pneumothorax EXAM: PORTABLE CHEST 1 VIEW COMPARISON:  09/08/2018 FINDINGS: Sternotomy wires overlie  normal cardiac silhouette. Persistent small LEFT pneumothorax laterally measures 11 mm from the chest wall. Pneumothorax appears decreased in volume compared to 1 day prior. Pigtail catheter along the LEFT lateral chest wall. Small LEFT effusion. RIGHT lung clear IMPRESSION: 1. Decrease in volume of small LEFT pneumothorax with chest tube in place 2. Small LEFT effusion. Electronically Signed   By: Suzy Bouchard M.D.   On: 09/09/2018 07:55   Dg Chest Port 1 View  Result Date: 09/08/2018 CLINICAL DATA:  Pneumothorax, chest tube EXAM: PORTABLE CHEST 1 VIEW COMPARISON:  Portable exam 1512 hours compared to 0909 hours FINDINGS: New pigtail LEFT thoracostomy tube. Persistent moderate LEFT pneumothorax estimated 20% though decreased from previous exam. Persistent LEFT pleural effusion. Underlying emphysematous and bronchitic changes consistent with COPD. Skin fold projects over LEFT chest as well. Persistent atelectasis versus infiltrate at RIGHT base. Normal heart size post AVR. Atherosclerotic calcifications aorta. IMPRESSION: Moderate LEFT pneumothorax decreased since pigtail thoracostomy tube placement. COPD changes with LEFT pleural effusion and RIGHT basilar atelectasis versus infiltrate. Electronically Signed   By: Lavonia Dana M.D.   On: 09/08/2018 15:30   Dg Chest Port 1 View  Addendum Date: 09/08/2018   ADDENDUM REPORT: 09/08/2018 10:28 ADDENDUM: Critical Value/emergent results were called by telephone at the time of interpretation on 09/08/2018 at 9:58 a.m. to Dr. Berle Mull , who verbally acknowledged these results. Electronically Signed   By: Marin Olp M.D.   On: 09/08/2018 10:28   Result Date: 09/08/2018 CLINICAL DATA:  Shortness of breath getting better. EXAM: PORTABLE CHEST 1 VIEW COMPARISON:  09/06/2018 FINDINGS: Lungs are adequately inflated with emphysematous disease. There is a new moderate left pneumothorax worse over the base. Associated atelectasis/collapse in the left base.  Persistent interstitial changes over the right base. Cardiomediastinal silhouette and remainder the exam is unchanged. IMPRESSION: New moderate left pneumothorax worse over the left base with associated left lower lobe collapse/atelectasis. Stable mild interstitial  changes right base. Emphysema (ICD10-J43.9). Electronically Signed: By: Marin Olp M.D. On: 09/08/2018 09:26   Dg Chest Port 1 View  Result Date: 09/06/2018 CLINICAL DATA:  Shortness of breath EXAM: PORTABLE CHEST 1 VIEW COMPARISON:  09/05/2018, 07/01/2018, 11/14/2017, CT chest 08/28/2018 FINDINGS: Small left-sided pleural effusion. Emphysematous disease. Probable skin fold artifact over the right thorax. Mild interstitial and alveolar opacity at both bases, slight increase. Post sternotomy changes. Aortic atherosclerosis. IMPRESSION: 1. Similar appearance of small left pleural effusion and bibasilar interstitial and hazy airspace disease which may reflect acute infiltrate superimposed on chronic change/post treatment change 2. Emphysematous disease Electronically Signed   By: Donavan Foil M.D.   On: 09/06/2018 19:25    Lab Data:  CBC: Recent Labs  Lab 09/05/18 1449  09/07/18 0504 09/08/18 0227 09/09/18 0216 09/10/18 0234 09/11/18 0246  WBC 12.9*   < > 13.3* 12.9* 12.9* 11.0* 13.7*  NEUTROABS 9.9*  --   --   --   --   --   --   HGB 12.5   < > 10.1* 10.3* 10.9* 10.5* 10.6*  HCT 40.4   < > 31.3* 33.1* 34.2* 33.0* 33.3*  MCV 91.6   < > 91.3 90.7 91.2 91.9 91.0  PLT 226   < > 239 232 256 253 291   < > = values in this interval not displayed.   Basic Metabolic Panel: Recent Labs  Lab 09/06/18 1317 09/06/18 1643 09/06/18 2306 09/07/18 0504 09/08/18 0816 09/11/18 0246  NA 137 138 138 140 143  --   K 3.9 3.8 3.6 3.5 3.5  --   CL 106 108 110 108 110  --   CO2 19* 18* 18* 22 23  --   GLUCOSE 496* 253* 172* 185* 133*  --   BUN 40* 46* 47* 45* 32*  --   CREATININE 1.39* 1.52* 1.22* 1.25* 1.18*  --   CALCIUM 8.5* 8.6*  8.5* 8.0* 9.2  --   MG  --   --   --   --   --  1.9  PHOS  --   --   --   --   --  4.2   GFR: Estimated Creatinine Clearance: 25.4 mL/min (A) (by C-G formula based on SCr of 1.18 mg/dL (H)). Liver Function Tests: Recent Labs  Lab 09/06/18 0228  AST 49*  ALT 39  ALKPHOS 75  BILITOT 0.9  PROT 6.5  ALBUMIN 3.1*   No results for input(s): LIPASE, AMYLASE in the last 168 hours. No results for input(s): AMMONIA in the last 168 hours. Coagulation Profile: No results for input(s): INR, PROTIME in the last 168 hours. Cardiac Enzymes: No results for input(s): CKTOTAL, CKMB, CKMBINDEX, TROPONINI in the last 168 hours. BNP (last 3 results) No results for input(s): PROBNP in the last 8760 hours. HbA1C: No results for input(s): HGBA1C in the last 72 hours. CBG: Recent Labs  Lab 09/10/18 0757 09/10/18 1124 09/10/18 1623 09/10/18 2341 09/11/18 0716  GLUCAP 110* 278* 206* 205* 91   Lipid Profile: No results for input(s): CHOL, HDL, LDLCALC, TRIG, CHOLHDL, LDLDIRECT in the last 72 hours. Thyroid Function Tests: No results for input(s): TSH, T4TOTAL, FREET4, T3FREE, THYROIDAB in the last 72 hours. Anemia Panel: No results for input(s): VITAMINB12, FOLATE, FERRITIN, TIBC, IRON, RETICCTPCT in the last 72 hours. Urine analysis:    Component Value Date/Time   COLORURINE YELLOW 03/02/2017 2156   APPEARANCEUR HAZY (A) 03/02/2017 2156   LABSPEC 1.014 03/02/2017 2156   PHURINE  5.0 03/02/2017 2156   GLUCOSEU NEGATIVE 03/02/2017 2156   HGBUR NEGATIVE 03/02/2017 2156   BILIRUBINUR NEGATIVE 03/02/2017 2156   [place] 03/02/2017 2156   PROTEINUR 30 (A) 03/02/2017 2156   UROBILINOGEN 0.2 07/11/2013 1416   NITRITE NEGATIVE 03/02/2017 2156   LEUKOCYTESUR SMALL (A) 03/02/2017 2156     Ripudeep Rai M.D. [facility] 09/11/2018, 10:40 AM  Pager: [phone] Between 7am to 7pm - call Pager - [phone]  After 7pm go to [email or web address] - password [email or web address]  Call night coverage  person covering after 7pm

## 2018-09-12 ENCOUNTER — Inpatient Hospital Stay (HOSPITAL_COMMUNITY): Payer: Medicare Other

## 2018-09-12 DIAGNOSIS — J9600 Acute respiratory failure, unspecified whether with hypoxia or hypercapnia: Secondary | ICD-10-CM

## 2018-09-12 DIAGNOSIS — J939 Pneumothorax, unspecified: Secondary | ICD-10-CM

## 2018-09-12 LAB — BASIC METABOLIC PANEL
Anion gap: 10 (ref 5–15)
BUN: 33 mg/dL — ABNORMAL HIGH (ref 8–23)
CO2: 26 mmol/L (ref 22–32)
CREATININE: 1.22 mg/dL — AB (ref 0.44–1.00)
Calcium: 8.8 mg/dL — ABNORMAL LOW (ref 8.9–10.3)
Chloride: 103 mmol/L (ref 98–111)
GFR calc Af Amer: 47 mL/min — ABNORMAL LOW (ref >60)
GFR calc non Af Amer: 41 mL/min — ABNORMAL LOW (ref >60)
Glucose, Bld: 97 mg/dL (ref 70–99)
Potassium: 3.9 mmol/L (ref 3.5–5.1)
Sodium: 139 mmol/L (ref 135–145)

## 2018-09-12 LAB — PHOSPHORUS: Phosphorus: 3.5 mg/dL (ref 2.5–4.6)

## 2018-09-12 LAB — MAGNESIUM: Magnesium: 2 mg/dL (ref 1.7–2.4)

## 2018-09-12 LAB — GLUCOSE, CAPILLARY
Glucose-Capillary: 108 mg/dL — ABNORMAL HIGH (ref 70–99)
Glucose-Capillary: 89 mg/dL (ref 70–99)
Glucose-Capillary: 299 mg/dL — ABNORMAL HIGH (ref 70–99)
Glucose-Capillary: 268 mg/dL — ABNORMAL HIGH (ref 70–99)

## 2018-09-12 MED ORDER — INSULIN ASPART 100 UNIT/ML ~~LOC~~ SOLN
0.0000 [IU] | Freq: Three times a day (TID) | SUBCUTANEOUS | Status: DC
  Administered 2018-09-12: 5 [IU] via SUBCUTANEOUS
  Administered 2018-09-13: 1 [IU] via SUBCUTANEOUS
  Administered 2018-09-13: 5 [IU] via SUBCUTANEOUS
  Administered 2018-09-13: 3 [IU] via SUBCUTANEOUS
  Administered 2018-09-14 (×2): 5 [IU] via SUBCUTANEOUS
  Administered 2018-09-15: 7 [IU] via SUBCUTANEOUS
  Administered 2018-09-15: 1 [IU] via SUBCUTANEOUS
  Administered 2018-09-15: 2 [IU] via SUBCUTANEOUS
  Administered 2018-09-16: 3 [IU] via SUBCUTANEOUS
  Administered 2018-09-16 – 2018-09-17 (×2): 2 [IU] via SUBCUTANEOUS
  Administered 2018-09-17 – 2018-09-18 (×2): 5 [IU] via SUBCUTANEOUS
  Administered 2018-09-18: 2 [IU] via SUBCUTANEOUS
  Administered 2018-09-19: 1 [IU] via SUBCUTANEOUS
  Administered 2018-09-19: 3 [IU] via SUBCUTANEOUS
  Administered 2018-09-20: 1 [IU] via SUBCUTANEOUS

## 2018-09-12 NOTE — Progress Notes (Signed)
Physical Therapy Treatment Patient Details Name: [name] MRN: [id number] DOB: December 26, 1931 Today's Date: 09/12/2018    History of Present Illness Patient is a 84 y/o female who presents from PCP office with concern for PNA- reports SOB, cough for a few days. CXR- multifocal PNA. Found to have acute on chronic respiratory failure secondary to COPD and PNA. Also DKA.  L chest tube inserted 09/08/18.  PMH includes COPD, depression, HTN, DM, CAD s/p CABG.    PT Comments    Patient doing well with therapy today, increasing ambulation distance, now DOE 0/4 with activity. SpO2 WNL on 3L with activity, pt with no complaints and delighted at her progress. Cont to rec HHPT for safe progression of activity.    Follow Up Recommendations  Home health PT;Supervision for mobility/OOB     Equipment Recommendations  None recommended by PT    Recommendations for Other Services       Precautions / Restrictions Precautions Precautions: Fall;Other (comment) Precaution Comments: monitor O2 sats with mobility Required Braces or Orthoses: Other Brace Other Brace: L side chest tube Restrictions Weight Bearing Restrictions: No    Mobility  Bed Mobility               General bed mobility comments: Pt seated EOB  Transfers Overall transfer level: Needs assistance Equipment used: Rolling walker (2 wheeled) Transfers: Sit to/from Stand Sit to Stand: Supervision            Ambulation/Gait Ambulation/Gait assistance: Supervision Gait Distance (Feet): 220 Feet Assistive device: Rolling walker (2 wheeled) Gait Pattern/deviations: Step-through pattern;Shuffle;Trunk flexed Gait velocity: decreased   General Gait Details: patient with no DOE during activity she reports is better today than prior PT visit. SpO2 at 96% after activity on 3L.    Stairs             Wheelchair Mobility    Modified Rankin (Stroke Patients Only)       Balance Overall balance assessment:  Needs assistance Sitting-balance support: Feet supported;Bilateral upper extremity supported Sitting balance-Leahy Scale: Good     Standing balance support: No upper extremity supported;Bilateral upper extremity supported;Single extremity supported Standing balance-Leahy Scale: Fair                              Cognition Arousal/Alertness: Awake/alert Behavior During Therapy: WFL for tasks assessed/performed Overall Cognitive Status: Within Functional Limits for tasks assessed                                 General Comments: pleasent       Exercises      General Comments        Pertinent Vitals/Pain Pain Assessment: No/denies pain    Home Living                      Prior Function            PT Goals (current goals can now be found in the care plan section) Acute Rehab PT Goals Patient Stated Goal: to go home soon.  Breathe better PT Goal Formulation: With patient Time For Goal Achievement: 09/22/18 Potential to Achieve Goals: Good Progress towards PT goals: Progressing toward goals    Frequency    Min 3X/week      PT Plan Current plan remains appropriate    Co-evaluation  AM-PAC PT "6 Clicks" Mobility   Outcome Measure  Help needed turning from your back to your side while in a flat bed without using bedrails?: None Help needed moving from lying on your back to sitting on the side of a flat bed without using bedrails?: None Help needed moving to and from a bed to a chair (including a wheelchair)?: A Little Help needed standing up from a chair using your arms (e.g., wheelchair or bedside chair)?: A Little Help needed to walk in hospital room?: None Help needed climbing 3-5 steps with a railing? : A Little 6 Click Score: 21    End of Session Equipment Utilized During Treatment: Gait belt;Oxygen Activity Tolerance: Patient tolerated treatment well Patient left: in bed;Other (comment);with  family/visitor present Nurse Communication: Mobility status PT Visit Diagnosis: Difficulty in walking, not elsewhere classified (R26.2);Unsteadiness on feet (R26.81);History of falling (Z91.81)     Time: 1030-1055 PT Time Calculation (min) (ACUTE ONLY): 25 min  Charges:  $Gait Training: 23-37 mins                    Reinaldo Berber, PT, DPT Acute Rehabilitation Services Pager: [phone] Office: [phone]     Reinaldo Berber 09/12/2018, 11:41 AM

## 2018-09-12 NOTE — Progress Notes (Signed)
NAME:  [name], MRN:  [id number], DOB:  27-Jan-1932, LOS: 7 ADMISSION DATE:  09/05/2018, CONSULTATION DATE:  09/08/2018 REFERRING MD:  Marlowe Sax MD, CHIEF COMPLAINT:  Lt pneumothorax   Brief History   84 year old with COPD admitted for acute respiratory failure, multifocal pneumonia, DKA Developed spontaneous pneumothorax on 12/22.  PCCM called for management  Past Medical History  COPD gold stage 2 but severe reducton in dlco (fev may 2019 - 0.8L/60% and dlco 29%), type 2 diabetes, diabetes mellitus, coronary artery disease, GERD, Stage I lung cancer [presumed stage I LLL nodule - empiric XRT ending April 2019).  She follows with Dr. Chase Caller.  History noted for PET positive, left lower lobe spiculated nodule Discussed at multidisciplinary tumor board and biopsy deferred, Underwent SBRT in April 2019  [facility] Events   12/19 > admit 12/22 Lt spontaneous pneumothorax s/p chest tube placement 12/23 Chest tube placed 12/22 remains in place, to suction, without air leak. Patient reports better pain management with changes made to pain regimen by primary team 12/26 chest tube changed to water seal  Consults:  PCCM 12/22 >  Procedures:  Lt chest tube 12/22 >  Significant Diagnostic Tests:  CT chest 08/28/2018- advanced emphysema, left lower lobe lung nodule with bandlike interstitial airspace opacity suggestive of radiation fibrosis. Chest x-ray 12/22 > moderate-sized left pneumothorax CXR 12/23- interval improvement in size of L-sided PTX, however small sized L PTX still present. Pigtail placement unchanged.   Micro Data:  Blood culture 12/19- NGTD  Antimicrobials:  Ceftriaxone for 7 days 12/19>> Azithromycin for 5 days 12/19>>  Interim history/subjective:  Comfortable, no complaints.  Chest tube was supposed to be off suction 12/25; however, when I rounded 12/26, it was not.  I removed suction and left on water seal   Objective   Blood pressure (!) 165/60,  pulse 61, temperature 98.1 F (36.7 C), temperature source Oral, resp. rate 18, height 5' (1.524 m), weight 45.4 kg, SpO2 98 %.        Intake/Output Summary (Last 24 hours) at 09/12/2018 1036 Last data filed at 09/12/2018 0600 Gross per 24 hour  Intake 240 ml  Output 10 ml  Net 230 ml   Filed Weights   09/05/18 1424  Weight: 45.4 kg     General - elderly female, in NAD Eyes - pupils reactive ENT - no sinus tenderness, no stridor Cardiac - regular rate/rhythm, no murmur Chest - decreased BS, no wheeze, Lt chest tube w/o air leak Abdomen - soft, non tender, + bowel sounds Extremities - no cyanosis, clubbing, or edema Skin - no rashes Neuro - normal strength, moves extremities, follows commands Psych - normal mood and behavior  Resolved Hospital Problem list   DKA  Assessment & Plan:  84 year old with advanced emphysema, lung cancer =- presumed LLL and empiric XRT April 2019 AE COPD, PNA, spontaneous left pneumothorax  Left spontaneous pneumothorax. Likely triggered by cough in setting of COPD with emphysema and severe DLCO reduction.  Has ipsilateral XRT in April 2019 for presumed lung cancer.  S/p chest tube placement 12/22. Plan - Change chest tube to water seal 12/26 (was supposed to be done 12/25, but never was) - Repeat CXR at 1600 this evening and in AM - If no recurrence of PTX by AM, then will d/c chest tube.  Acute on chronic respiratory failure COPD, pneumonia Multifocal PNA appreciated on CXR 09/05/18 Interval improvement per CXRs Plan - continue ABx per primary team - continue anoro, budesonide, prednsione -  bronchial hygiene  Rest per primary team.   Montey Hora, [facility] Pager: [phone]  or [phone] 09/12/2018, 10:40 AM

## 2018-09-12 NOTE — Progress Notes (Signed)
[facility]                                                                              Patient Demographics  [name], is a 84 y.o. female, DOB - 1932-09-02, YBO:[id number]  Admit date - 09/05/2018   Admitting Physician Elwyn Reach, MD  Outpatient Primary MD for the patient is Harlan Stains, MD  Outpatient specialists:   LOS - 7  days   Medical records reviewed and are as summarized below:    Chief Complaint  Patient presents with  . Shortness of Breath       Brief summary   Patient is 84 year old female with severe reduction in DLCO, COPD,  type II DM, malnutrition, stage I lung cancer presumed stage I LLL nodule, empiric XRT ended in 12/2017 was admitted on 09/05/2018, presented with complaint of shortness of breath, was found to have COPD exacerbation with multifocal pneumonia and DKA.  Patient developed spontaneous pneumothorax on 12/22 status post chest tube.  Assessment & Plan   Principal problem Acute on chronic respiratory failure with hypoxia secondary to multifocal pneumonia, COPD exacerbation, acute pneumothorax in the setting of severe reduction in DLCO, stage I lung CA -Patient actually presented with shortness of breath, coughing, initial chest x-ray showed bilateral lower lobe pneumonia -Patient was started on IV antibiotics, Zithromax and IV Rocephin. -Urine strep and antigen negative, blood cultures negative.  She was also started on IV Solu-Medrol, subsequently transitioned to oral prednisone and oral antibiotics -Patient developed spontaneous left pneumothorax on 12/22. Pulmonology was consulted, underwent chest tube placement. -Management per pulmonology, plan to DC chest tube tomorrow   Type 2 diabetes mellitus, uncontrolled with hyperglycemia, DKA -CBGs worsened secondary to steroids, subsequently causing high anion gap metabolic acidosis.  Lactic acid was normal.  Patient has a history of type 2 diabetes mellitus and was  recently taken off of for metformin. -Patient was placed on DKA protocol with IV fluids, IV insulin -Now has been transitioned to subcutaneous insulin -CBGs well-controlled today, decrease sliding scale insulin to sensitive and DC'd meal coverage, continue Lantus -Patient will need glipizide or Amaryl at discharge for short-term to cover for steroid-induced hyperglycemia along with metformin  Chronic diastolic CHF -Euvolemic, Initially Lasix and Avapro were held due to acute kidney injury -Continue to hold Lasix for now, Avapro restarted due to accelerated hypertension  Accelerated hypertension BP better today still somewhat elevated Continue hydralazine IV as needed with parameters, amlodipine, clonidine, Avapro  Dyslipidemia Continue Pravachol  Severe protein calorie malnutrition Dietitian consulted, continue nutritional supplement  Code Status: Full code DVT Prophylaxis:  Lovenox Family Communication: Discussed in detail with the patient, all imaging results, lab results explained to the patient, alert and oriented x4.   Disposition Plan: Disposition planning after chest tube is removed  Time Spent in minutes 25 minutes  Procedures:  Chest tube placement on 12/22  Consultants:   Pulmonary critical care  Antimicrobials:      Medications  Scheduled Meds: . amLODipine  10 mg Oral Daily  . aspirin EC  81 mg Oral QHS  . azithromycin  500 mg Oral Daily  . benzonatate  100 mg Oral TID  . budesonide (PULMICORT) nebulizer solution  0.5 mg Nebulization BID  . buPROPion  150 mg Oral Daily  . cholecalciferol  1,000 Units Oral QHS  . cloNIDine  0.1 mg Oral Q8H  . dextromethorphan-guaiFENesin  1 tablet Oral BID  . enoxaparin (LOVENOX) injection  30 mg Subcutaneous Q24H  . feeding supplement (GLUCERNA SHAKE)  237 mL Oral TID BM  . ferrous sulfate  325 mg Oral BID  . insulin aspart  0-20 Units Subcutaneous TID WC  . insulin aspart  0-5 Units Subcutaneous QHS  . insulin  aspart  3 Units Subcutaneous TID WC  . insulin glargine  8 Units Subcutaneous Daily  . irbesartan  150 mg Oral Daily  . magnesium oxide  200 mg Oral Daily  . pravastatin  20 mg Oral QHS  . predniSONE  50 mg Oral Q breakfast  . sertraline  100 mg Oral q morning - 10a  . umeclidinium-vilanterol  1 puff Inhalation Daily   Continuous Infusions: . cefTRIAXone (ROCEPHIN)  IV 1 g (09/11/18 2042)   PRN Meds:.acetaminophen, albuterol, antiseptic oral rinse, guaiFENesin, hydrALAZINE, menthol-cetylpyridinium, oxyCODONE-acetaminophen **AND** oxyCODONE   Antibiotics   Anti-infectives (From admission, onward)   Start     Dose/Rate Route Frequency Ordered Stop   09/08/18 2000  azithromycin (ZITHROMAX) tablet 500 mg     500 mg Oral Daily 09/08/18 0809 09/13/18 1959   09/06/18 2100  cefTRIAXone (ROCEPHIN) 1 g in sodium chloride 0.9 % 100 mL IVPB     1 g 200 mL/hr over 30 Minutes Intravenous Every 24 hours 09/05/18 2058 09/13/18 2059   09/06/18 2000  azithromycin (ZITHROMAX) 500 mg in sodium chloride 0.9 % 250 mL IVPB  Status:  Discontinued     500 mg 250 mL/hr over 60 Minutes Intravenous Every 24 hours 09/05/18 2058 09/08/18 0809   09/05/18 1800  cefTRIAXone (ROCEPHIN) 1 g in sodium chloride 0.9 % 100 mL IVPB     1 g 200 mL/hr over 30 Minutes Intravenous  Once 09/05/18 1748 09/05/18 1953   09/05/18 1800  azithromycin (ZITHROMAX) 500 mg in sodium chloride 0.9 % 250 mL IVPB     500 mg 250 mL/hr over 60 Minutes Intravenous  Once 09/05/18 1748 09/05/18 2012        Subjective:   [name] was seen and examined today.  Today feels a lot better, no chest pain or shortness of breath.  Patient denies dizziness,  abdominal pain, N/V/D/C, new weakness, numbess, tingling.  No fevers.  Objective:   Vitals:   09/11/18 2300 09/12/18 0722 09/12/18 0729 09/12/18 1205  BP: (!) 148/58  (!) 165/60 (!) 152/59  Pulse: 60 62 61 60  Resp: 17 18 18 18   Temp: 98.9 F (37.2 C)  98.1 F (36.7 C)   TempSrc:  Oral  Oral   SpO2: 94% 96% 98% 99%  Weight:      Height:        Intake/Output Summary (Last 24 hours) at 09/12/2018 1217 Last data filed at 09/12/2018 0600 Gross per 24 hour  Intake 240 ml  Output 10 ml  Net 230 ml     Wt Readings from Last 3 Encounters:  09/05/18 45.4 kg  08/12/18 45.2 kg  08/08/18 46 kg    Physical Exam  General: Alert and oriented x 3, NAD  Eyes:   HEENT:    Cardiovascular: S1-S2 clear, RRR  Respiratory: Decreased breath sounds Lt> right  Gastrointestinal: Soft, nontender, nondistended, + bowel sounds  Ext: no pedal edema bilaterally  Neuro: No new deficits  Musculoskeletal: No digital cyanosis, clubbing  Skin: No rashes  Psych: Normal affect and demeanor, alert and oriented x3    Data Reviewed:  I have personally reviewed following labs and imaging studies  Micro Results Recent Results (from the past 240 hour(s))  Culture, blood (routine x 2) Call MD if unable to obtain prior to antibiotics being given     Status: None   Collection Time: 09/05/18  9:00 PM  Result Value Ref Range Status   Specimen Description BLOOD RIGHT HAND  Final   Special Requests   Final    BOTTLES DRAWN AEROBIC ONLY Blood Culture adequate volume   Culture   Final    NO GROWTH 5 DAYS Performed at [facility], [address], [place], [place] [address]    Report Status 09/10/2018 FINAL  Final  Culture, blood (routine x 2) Call MD if unable to obtain prior to antibiotics being given     Status: None   Collection Time: 09/05/18  9:14 PM  Result Value Ref Range Status   Specimen Description BLOOD LEFT HAND  Final   Special Requests   Final    BOTTLES DRAWN AEROBIC ONLY Blood Culture adequate volume   Culture   Final    NO GROWTH 5 DAYS Performed at [facility], [place] [address], [place], [place] [address]    Report Status 09/10/2018 FINAL  Final    Radiology Reports Dg Chest 2 View  Result Date: 09/05/2018 CLINICAL DATA:  Productive cough  and shortness of breath for the past 2-3 days. Clinical stage I non-small cell lung cancer in the left lower lobe. EXAM: CHEST - 2 VIEW COMPARISON:  Chest radiographs dated 07/01/2018. Chest CT dated 08/28/2018. FINDINGS: Normal sized heart. Significantly increase left lower airspace opacity. Small left pleural effusion, increased. Stable hyperexpansion of lungs and mild patchy opacity at the right lung base. Stable mild right pleural thickening. Mild thoracic spine degenerative changes. IMPRESSION: 1. Significantly increased left lower lobe pneumonia. 2. Small left pleural effusion, increased. 3. Stable mild pneumonia or postradiation changes at the right lung base. 4. Stable mild changes of COPD. Electronically Signed   By: Claudie Revering M.D.   On: 09/05/2018 16:15   Ct Chest Wo Contrast  Result Date: 08/28/2018 CLINICAL DATA:  Presumptive clinical stage I non-small cell lung cancer of the left lower lung. EXAM: CT CHEST WITHOUT CONTRAST TECHNIQUE: Multidetector CT imaging of the chest was performed following the standard protocol without IV contrast. COMPARISON:  02/12/2018 FINDINGS: Cardiovascular: The heart size is normal. No substantial pericardial effusion. Coronary artery calcification is evident. Atherosclerotic calcification is noted in the wall of the thoracic aorta. Patient is status post CABG. Right main pulmonary artery measures 3.1 cm diameter. Mediastinum/Nodes: No mediastinal lymphadenopathy. No evidence for gross hilar lymphadenopathy although assessment is limited by the lack of intravenous contrast on today's study. The esophagus has normal imaging features. Moderate hiatal hernia noted. There is no axillary lymphadenopathy. Lungs/Pleura: The central tracheobronchial airways are patent. Moderate to advanced emphysema with bullous change in the upper lobes. 15 x 11 mm peripheral left lower lobe nodule seen previously has become incorporated into a bandlike area of interstitial and airspace  opacity in the peripheral left lower lobe, suggesting evolving radiation fibrosis. There is a nodular component today measuring 15 x 20 mm on 118/7. Trace left pleural effusion. Upper Abdomen: Unremarkable. Musculoskeletal: No worrisome lytic or sclerotic osseous abnormality.  T12 superior endplate compression fracture is similar to prior. IMPRESSION: 1. Interval increase in bandlike opacity in the peripheral left lower lobe, incorporating the irregular nodule seen on the prior study. Imaging features most likely reflect changes from radiation therapy. 2. Prominence of the pulmonary arteries raises the question of pulmonary arterial hypertension. 3. Moderate to advanced Emphysema. (ICD10-J43.9) 4.  Aortic Atherosclerois (ICD10-170.0) 5. Moderate hiatal hernia. Electronically Signed   By: Misty Stanley M.D.   On: 08/28/2018 15:39   Dg Chest Port 1 View  Result Date: 09/11/2018 CLINICAL DATA:  Left-sided pneumothorax EXAM: PORTABLE CHEST 1 VIEW COMPARISON:  Prior exams dating back through 09/09/2018. FINDINGS: Further decrease in size of tiny left apical pneumothorax, now barely visible. Left-sided chest tube is in place. Chronic stable blunting the left lateral costophrenic angle presumably from small left effusion with adjacent atelectasis. Improved aeration at the left lung base. Heart and mediastinal contours are stable with moderate aortic atherosclerosis. No aneurysm. Median sternotomy sutures are place with aortic valvular replacement and post CABG change. IMPRESSION: Further decrease in size of tiny left apical pneumothorax. Left-sided chest tube is in place. Improved aeration at the left lung base with unchanged presumed small left effusion. Electronically Signed   By: Ashley Royalty M.D.   On: 09/11/2018 17:42   Dg Chest Port 1 View  Result Date: 09/10/2018 CLINICAL DATA:  Followup left pneumothorax and chest tube. EXAM: PORTABLE CHEST 1 VIEW COMPARISON:  Yesterday. FINDINGS: Skin folds are  demonstrated on the left. Significant reduction in the left pneumothorax with a less than 5% apical pneumothorax remaining. A small caliber left chest tube remains in place. No significant change in a small left pleural effusion and bibasilar atelectasis or pneumonia. Mild-to-moderate peribronchial thickening is unchanged. Diffuse osteopenia. IMPRESSION: 1. Significant reduction in the left pneumothorax with a less than 5% apical pneumothorax remaining. 2. Stable small left pleural effusion and bibasilar atelectasis or pneumonia. 3. Stable mild to moderate bronchitic changes. Electronically Signed   By: Claudie Revering M.D.   On: 09/10/2018 08:24   Dg Chest Port 1 View  Result Date: 09/09/2018 CLINICAL DATA:  Chest tube, pneumothorax EXAM: PORTABLE CHEST 1 VIEW COMPARISON:  09/08/2018 FINDINGS: Sternotomy wires overlie normal cardiac silhouette. Persistent small LEFT pneumothorax laterally measures 11 mm from the chest wall. Pneumothorax appears decreased in volume compared to 1 day prior. Pigtail catheter along the LEFT lateral chest wall. Small LEFT effusion. RIGHT lung clear IMPRESSION: 1. Decrease in volume of small LEFT pneumothorax with chest tube in place 2. Small LEFT effusion. Electronically Signed   By: Suzy Bouchard M.D.   On: 09/09/2018 07:55   Dg Chest Port 1 View  Result Date: 09/08/2018 CLINICAL DATA:  Pneumothorax, chest tube EXAM: PORTABLE CHEST 1 VIEW COMPARISON:  Portable exam 1512 hours compared to 0909 hours FINDINGS: New pigtail LEFT thoracostomy tube. Persistent moderate LEFT pneumothorax estimated 20% though decreased from previous exam. Persistent LEFT pleural effusion. Underlying emphysematous and bronchitic changes consistent with COPD. Skin fold projects over LEFT chest as well. Persistent atelectasis versus infiltrate at RIGHT base. Normal heart size post AVR. Atherosclerotic calcifications aorta. IMPRESSION: Moderate LEFT pneumothorax decreased since pigtail thoracostomy tube  placement. COPD changes with LEFT pleural effusion and RIGHT basilar atelectasis versus infiltrate. Electronically Signed   By: Lavonia Dana M.D.   On: 09/08/2018 15:30   Dg Chest Port 1 View  Addendum Date: 09/08/2018   ADDENDUM REPORT: 09/08/2018 10:28 ADDENDUM: Critical Value/emergent results were called by telephone at the time of  interpretation on 09/08/2018 at 9:58 a.m. to Dr. Berle Mull , who verbally acknowledged these results. Electronically Signed   By: Marin Olp M.D.   On: 09/08/2018 10:28   Result Date: 09/08/2018 CLINICAL DATA:  Shortness of breath getting better. EXAM: PORTABLE CHEST 1 VIEW COMPARISON:  09/06/2018 FINDINGS: Lungs are adequately inflated with emphysematous disease. There is a new moderate left pneumothorax worse over the base. Associated atelectasis/collapse in the left base. Persistent interstitial changes over the right base. Cardiomediastinal silhouette and remainder the exam is unchanged. IMPRESSION: New moderate left pneumothorax worse over the left base with associated left lower lobe collapse/atelectasis. Stable mild interstitial changes right base. Emphysema (ICD10-J43.9). Electronically Signed: By: Marin Olp M.D. On: 09/08/2018 09:26   Dg Chest Port 1 View  Result Date: 09/06/2018 CLINICAL DATA:  Shortness of breath EXAM: PORTABLE CHEST 1 VIEW COMPARISON:  09/05/2018, 07/01/2018, 11/14/2017, CT chest 08/28/2018 FINDINGS: Small left-sided pleural effusion. Emphysematous disease. Probable skin fold artifact over the right thorax. Mild interstitial and alveolar opacity at both bases, slight increase. Post sternotomy changes. Aortic atherosclerosis. IMPRESSION: 1. Similar appearance of small left pleural effusion and bibasilar interstitial and hazy airspace disease which may reflect acute infiltrate superimposed on chronic change/post treatment change 2. Emphysematous disease Electronically Signed   By: Donavan Foil M.D.   On: 09/06/2018 19:25    Lab  Data:  CBC: Recent Labs  Lab 09/05/18 1449  09/07/18 0504 09/08/18 0227 09/09/18 0216 09/10/18 0234 09/11/18 0246  WBC 12.9*   < > 13.3* 12.9* 12.9* 11.0* 13.7*  NEUTROABS 9.9*  --   --   --   --   --   --   HGB 12.5   < > 10.1* 10.3* 10.9* 10.5* 10.6*  HCT 40.4   < > 31.3* 33.1* 34.2* 33.0* 33.3*  MCV 91.6   < > 91.3 90.7 91.2 91.9 91.0  PLT 226   < > 239 232 256 253 291   < > = values in this interval not displayed.   Basic Metabolic Panel: Recent Labs  Lab 09/06/18 1643 09/06/18 2306 09/07/18 0504 09/08/18 0816 09/11/18 0246 09/12/18 0554  NA 138 138 140 143  --  139  K 3.8 3.6 3.5 3.5  --  3.9  CL 108 110 108 110  --  103  CO2 18* 18* 22 23  --  26  GLUCOSE 253* 172* 185* 133*  --  97  BUN 46* 47* 45* 32*  --  33*  CREATININE 1.52* 1.22* 1.25* 1.18*  --  1.22*  CALCIUM 8.6* 8.5* 8.0* 9.2  --  8.8*  MG  --   --   --   --  1.9 2.0  PHOS  --   --   --   --  4.2 3.5   GFR: Estimated Creatinine Clearance: 24.6 mL/min (A) (by C-G formula based on SCr of 1.22 mg/dL (H)). Liver Function Tests: Recent Labs  Lab 09/06/18 0228  AST 49*  ALT 39  ALKPHOS 75  BILITOT 0.9  PROT 6.5  ALBUMIN 3.1*   No results for input(s): LIPASE, AMYLASE in the last 168 hours. No results for input(s): AMMONIA in the last 168 hours. Coagulation Profile: No results for input(s): INR, PROTIME in the last 168 hours. Cardiac Enzymes: No results for input(s): CKTOTAL, CKMB, CKMBINDEX, TROPONINI in the last 168 hours. BNP (last 3 results) No results for input(s): PROBNP in the last 8760 hours. HbA1C: No results for input(s): HGBA1C in the last 72  hours. CBG: Recent Labs  Lab 09/11/18 1140 09/11/18 1614 09/11/18 2223 09/12/18 0727 09/12/18 1154  GLUCAP 73 242* 270* 108* 89   Lipid Profile: No results for input(s): CHOL, HDL, LDLCALC, TRIG, CHOLHDL, LDLDIRECT in the last 72 hours. Thyroid Function Tests: No results for input(s): TSH, T4TOTAL, FREET4, T3FREE, THYROIDAB in the  last 72 hours. Anemia Panel: No results for input(s): VITAMINB12, FOLATE, FERRITIN, TIBC, IRON, RETICCTPCT in the last 72 hours. Urine analysis:    Component Value Date/Time   COLORURINE YELLOW 03/02/2017 2156   APPEARANCEUR HAZY (A) 03/02/2017 2156   LABSPEC 1.014 03/02/2017 2156   PHURINE 5.0 03/02/2017 2156   GLUCOSEU NEGATIVE 03/02/2017 2156   HGBUR NEGATIVE 03/02/2017 2156   BILIRUBINUR NEGATIVE 03/02/2017 2156   KETONESUR NEGATIVE 03/02/2017 2156   PROTEINUR 30 (A) 03/02/2017 2156   UROBILINOGEN 0.2 07/11/2013 1416   NITRITE NEGATIVE 03/02/2017 2156   LEUKOCYTESUR SMALL (A) 03/02/2017 2156     Arieh Bogue M.D. [facility] 09/12/2018, 12:17 PM  Pager: [phone] Between 7am to 7pm - call Pager - [phone]  After 7pm go to [email or web address] - password [email or web address]  Call night coverage person covering after 7pm

## 2018-09-13 ENCOUNTER — Ambulatory Visit: Payer: Medicare Other | Admitting: Podiatry

## 2018-09-13 ENCOUNTER — Inpatient Hospital Stay (HOSPITAL_COMMUNITY): Payer: Medicare Other

## 2018-09-13 LAB — GLUCOSE, CAPILLARY
Glucose-Capillary: 126 mg/dL — ABNORMAL HIGH (ref 70–99)
Glucose-Capillary: 219 mg/dL — ABNORMAL HIGH (ref 70–99)
Glucose-Capillary: 278 mg/dL — ABNORMAL HIGH (ref 70–99)
Glucose-Capillary: 207 mg/dL — ABNORMAL HIGH (ref 70–99)

## 2018-09-13 LAB — BASIC METABOLIC PANEL
ANION GAP: 10 (ref 5–15)
BUN: 33 mg/dL — ABNORMAL HIGH (ref 8–23)
CHLORIDE: 105 mmol/L (ref 98–111)
CO2: 22 mmol/L (ref 22–32)
Calcium: 8.8 mg/dL — ABNORMAL LOW (ref 8.9–10.3)
Creatinine, Ser: 1.22 mg/dL — ABNORMAL HIGH (ref 0.44–1.00)
GFR calc Af Amer: 47 mL/min — ABNORMAL LOW (ref >60)
GFR calc non Af Amer: 41 mL/min — ABNORMAL LOW (ref >60)
Glucose, Bld: 145 mg/dL — ABNORMAL HIGH (ref 70–99)
Potassium: 5.9 mmol/L — ABNORMAL HIGH (ref 3.5–5.1)
Sodium: 137 mmol/L (ref 135–145)

## 2018-09-13 LAB — MAGNESIUM: Magnesium: 2.1 mg/dL (ref 1.7–2.4)

## 2018-09-13 LAB — POTASSIUM: POTASSIUM: 4 mmol/L (ref 3.5–5.1)

## 2018-09-13 LAB — PHOSPHORUS: Phosphorus: 3.1 mg/dL (ref 2.5–4.6)

## 2018-09-13 MED ORDER — INSULIN GLARGINE 100 UNIT/ML ~~LOC~~ SOLN
10.0000 [IU] | Freq: Every day | SUBCUTANEOUS | Status: DC
  Administered 2018-09-14 – 2018-09-20 (×7): 10 [IU] via SUBCUTANEOUS
  Filled 2018-09-13 (×7): qty 0.1

## 2018-09-13 MED ORDER — HYDRALAZINE HCL 10 MG PO TABS
10.0000 mg | ORAL_TABLET | Freq: Three times a day (TID) | ORAL | Status: DC
  Administered 2018-09-13 – 2018-09-20 (×21): 10 mg via ORAL
  Filled 2018-09-13 (×21): qty 1

## 2018-09-13 MED ORDER — SODIUM POLYSTYRENE SULFONATE 15 GM/60ML PO SUSP
30.0000 g | Freq: Once | ORAL | Status: AC
  Administered 2018-09-13: 30 g via ORAL
  Filled 2018-09-13 (×2): qty 120

## 2018-09-13 NOTE — Progress Notes (Signed)
Inpatient Diabetes Program Recommendations  AACE/ADA: New Consensus Statement on Inpatient Glycemic Control (2015)  Target Ranges:  Prepandial:   less than 140 mg/dL      Peak postprandial:   less than 180 mg/dL (1-2 hours)      Critically ill patients:  140 - 180 mg/dL   Lab Results  Component Value Date   GLUCAP 219 (H) 09/13/2018   HGBA1C 7.4 (H) 09/06/2018    Results for [name], [name] (MRN [id number]) as of 09/13/2018 13:12  Ref. Range 09/12/2018 11:54 09/12/2018 17:21 09/12/2018 20:44 09/13/2018 07:11 09/13/2018 11:53  Glucose-Capillary Latest Ref Range: 70 - 99 mg/dL 89 299 (H) 268 (H) 126 (H) 219 (H)     DM 2  Home DM meds: Had been on metformin but taken off recently  Current DM meds: Lantus 8 units daily (to increase to 10 units tomorrow am)                                Novolog sensitive correction (0-9 units) tid and (0-5 units) hs                                  Prednisone 50 mg daily     MD please consider following recommendations:   Recommend Amaryl 1 mg daily at home.   CBG meter at home is broken. MD - at discharge please write prescription for Blood glucose meter kit (includes lancet and strips) Order # [id number].  Patient to check CBG twice daily. Explained to patient if CBG is below 141m/dl may want to contact PCP before taking oral med due to concern for hypoglycemia. She is going to record and take with her to her follow up appt. Discussed with patient that the steroids do cause increased blood glucose levels.   Reviewed symptoms of Hypoglycemia with patient: [name]  Check sugar if you have your meter. If near or less than 70 mg/dl, treat with 1/2 cup juice or soda or take glucose tablets  Check sugar 15 minutes after treatment. If sugar still near or less than 70 mg/al and symptomatic, treat again and may need a snack with some protein (peanut butter with crackers, etc)  She is going to call for a follow up with PCP (Dr. WDema Severin as  soon as possible at discharge.   Thanks.  -- Will follow during hospitalization.--  SJonna ClarkRN, MSN Diabetes Coordinator Inpatient Glycemic Control Team Team Pager: [phone](8am-5pm)

## 2018-09-13 NOTE — Progress Notes (Signed)
[facility]                                                                              Patient Demographics  [name], is a 84 y.o. female, DOB - 06-01-1932, SLH:[id number]  Admit date - 09/05/2018   Admitting Physician Elwyn Reach, MD  Outpatient Primary MD for the patient is Harlan Stains, MD  Outpatient specialists:   LOS - 8  days   Medical records reviewed and are as summarized below:    Chief Complaint  Patient presents with  . Shortness of Breath       Brief summary   Patient is 84 year old female with severe reduction in DLCO, COPD,  type II DM, malnutrition, stage I lung cancer presumed stage I LLL nodule, empiric XRT ended in 12/2017 was admitted on 09/05/2018, presented with complaint of shortness of breath, was found to have COPD exacerbation with multifocal pneumonia and DKA.  Patient developed spontaneous pneumothorax on 12/22 status post chest tube.  Assessment & Plan   Principal problem Acute on chronic respiratory failure with hypoxia secondary to multifocal pneumonia, COPD exacerbation, acute pneumothorax in the setting of severe reduction in DLCO, stage I lung CA -Patient actually presented with shortness of breath, coughing, initial chest x-ray showed bilateral lower lobe pneumonia -Patient was started on IV antibiotics, Zithromax and IV Rocephin. -Urine strep and antigen negative, blood cultures negative.  She was also started on IV Solu-Medrol, subsequently transitioned to oral prednisone and oral antibiotics -Patient developed spontaneous left pneumothorax on 12/22. Pulmonology was consulted, underwent chest tube placement. -Chest tube managed by pulmonology, clamped today.  Repeat chest x-ray later, plan for DC chest tube if no recurrence of pneumothorax.   Type 2 diabetes mellitus, uncontrolled with hyperglycemia, DKA -CBGs worsened secondary to steroids, subsequently causing high anion gap metabolic acidosis.  Lactic acid  was normal.  Patient has a history of type 2 diabetes mellitus and was recently taken off of for metformin. -Patient was placed on DKA protocol with IV fluids, IV insulin, now transitioned to subcu insulin -CBG elevated today, increase Lantus to 10 units, continue sliding scale insulin -Patient will need glipizide or Amaryl at discharge for short-term to cover for steroid-induced hyperglycemia along with metformin  Chronic diastolic CHF -Euvolemic, Initially Lasix and Avapro were held due to acute kidney injury -Creatinine 1.2  Accelerated hypertension Continue amlodipine, clonidine, Avapro, placed on hydralazine   Dyslipidemia Continue Pravachol  Severe protein calorie malnutrition Dietitian consulted, continue nutritional supplement  Hyperkalemia Kayexalate x1, repeat potassium 4.0  Code Status: Full code DVT Prophylaxis:  Lovenox Family Communication: Discussed in detail with the patient, all imaging results, lab results explained to the patient, alert and oriented x4.   Disposition Plan: Chest tube clamped today, possible DC home in a.m. if doing well and chest tube removed   Time Spent in minutes 25 minutes  Procedures:  Chest tube placement on 12/22  Consultants:   Pulmonary critical care  Antimicrobials:      Medications  Scheduled Meds: . amLODipine  10 mg Oral Daily  . aspirin EC  81 mg Oral QHS  . benzonatate  100  mg Oral TID  . budesonide (PULMICORT) nebulizer solution  0.5 mg Nebulization BID  . buPROPion  150 mg Oral Daily  . cholecalciferol  1,000 Units Oral QHS  . cloNIDine  0.1 mg Oral Q8H  . dextromethorphan-guaiFENesin  1 tablet Oral BID  . enoxaparin (LOVENOX) injection  30 mg Subcutaneous Q24H  . feeding supplement (GLUCERNA SHAKE)  237 mL Oral TID BM  . ferrous sulfate  325 mg Oral BID  . insulin aspart  0-5 Units Subcutaneous QHS  . insulin aspart  0-9 Units Subcutaneous TID WC  . insulin glargine  8 Units Subcutaneous Daily  .  pravastatin  20 mg Oral QHS  . predniSONE  50 mg Oral Q breakfast  . sertraline  100 mg Oral q morning - 10a  . umeclidinium-vilanterol  1 puff Inhalation Daily   Continuous Infusions:  PRN Meds:.acetaminophen, albuterol, antiseptic oral rinse, guaiFENesin, hydrALAZINE, menthol-cetylpyridinium, oxyCODONE-acetaminophen **AND** oxyCODONE   Antibiotics   Anti-infectives (From admission, onward)   Start     Dose/Rate Route Frequency Ordered Stop   09/08/18 2000  azithromycin (ZITHROMAX) tablet 500 mg  Status:  Discontinued     500 mg Oral Daily 09/08/18 0809 09/12/18 1254   09/06/18 2100  cefTRIAXone (ROCEPHIN) 1 g in sodium chloride 0.9 % 100 mL IVPB  Status:  Discontinued     1 g 200 mL/hr over 30 Minutes Intravenous Every 24 hours 09/05/18 2058 09/12/18 1254   09/06/18 2000  azithromycin (ZITHROMAX) 500 mg in sodium chloride 0.9 % 250 mL IVPB  Status:  Discontinued     500 mg 250 mL/hr over 60 Minutes Intravenous Every 24 hours 09/05/18 2058 09/08/18 0809   09/05/18 1800  cefTRIAXone (ROCEPHIN) 1 g in sodium chloride 0.9 % 100 mL IVPB     1 g 200 mL/hr over 30 Minutes Intravenous  Once 09/05/18 1748 09/05/18 1953   09/05/18 1800  azithromycin (ZITHROMAX) 500 mg in sodium chloride 0.9 % 250 mL IVPB     500 mg 250 mL/hr over 60 Minutes Intravenous  Once 09/05/18 1748 09/05/18 2012        Subjective:   [name] was seen and examined today.  No acute complaints except hoping to get out of the chest tube.  No chest pain or shortness of breath.  Patient denies dizziness,  abdominal pain, N/V/D/C, new weakness, numbess, tingling.  No fevers.  Objective:   Vitals:   09/13/18 0712 09/13/18 0722 09/13/18 0726 09/13/18 1155  BP: (!) 158/89   (!) 164/86  Pulse: (!) 57 (!) 56  77  Resp: 19 14  (!) 26  Temp: 97.7 F (36.5 C)     TempSrc: Oral     SpO2: 96% 94% 97%   Weight:      Height:        Intake/Output Summary (Last 24 hours) at 09/13/2018 1306 Last data filed at  09/13/2018 0730 Gross per 24 hour  Intake -  Output 500 ml  Net -500 ml     Wt Readings from Last 3 Encounters:  09/05/18 45.4 kg  08/12/18 45.2 kg  08/08/18 46 kg   Physical Exam  General: Alert and oriented x 3, NAD  Eyes:   HEENT:  Atraumatic, normocephalic  Cardiovascular: S1 S2 clear, RRR. No pedal edema b/l  Respiratory: Decreased breath sounds  Gastrointestinal: Soft, nontender, nondistended, NBS  Ext: no pedal edema bilaterally  Neuro: no new deficits  Musculoskeletal: No cyanosis, clubbing  Skin: No rashes  Psych: Normal affect  and demeanor, alert and oriented x3     Data Reviewed:  I have personally reviewed following labs and imaging studies  Micro Results Recent Results (from the past 240 hour(s))  Culture, blood (routine x 2) Call MD if unable to obtain prior to antibiotics being given     Status: None   Collection Time: 09/05/18  9:00 PM  Result Value Ref Range Status   Specimen Description BLOOD RIGHT HAND  Final   Special Requests   Final    BOTTLES DRAWN AEROBIC ONLY Blood Culture adequate volume   Culture   Final    NO GROWTH 5 DAYS Performed at [facility], [address] [place] [address], [place], [place] [address]    Report Status 09/10/2018 FINAL  Final  Culture, blood (routine x 2) Call MD if unable to obtain prior to antibiotics being given     Status: None   Collection Time: 09/05/18  9:14 PM  Result Value Ref Range Status   Specimen Description BLOOD LEFT HAND  Final   Special Requests   Final    BOTTLES DRAWN AEROBIC ONLY Blood Culture adequate volume   Culture   Final    NO GROWTH 5 DAYS Performed at [facility], [place] [address] [place],  [address]    Report Status 09/10/2018 FINAL  Final    Radiology Reports Dg Chest 2 View  Result Date: 09/05/2018 CLINICAL DATA:  Productive cough and shortness of breath for the past 2-3 days. Clinical stage I non-small cell lung cancer in the left lower lobe. EXAM: CHEST - 2 VIEW  COMPARISON:  Chest radiographs dated 07/01/2018. Chest CT dated 08/28/2018. FINDINGS: Normal sized heart. Significantly increase left lower airspace opacity. Small left pleural effusion, increased. Stable hyperexpansion of lungs and mild patchy opacity at the right lung base. Stable mild right pleural thickening. Mild thoracic spine degenerative changes. IMPRESSION: 1. Significantly increased left lower lobe pneumonia. 2. Small left pleural effusion, increased. 3. Stable mild pneumonia or postradiation changes at the right lung base. 4. Stable mild changes of COPD. Electronically Signed   By: Claudie Revering M.D.   On: 09/05/2018 16:15   Ct Chest Wo Contrast  Result Date: 08/28/2018 CLINICAL DATA:  Presumptive clinical stage I non-small cell lung cancer of the left lower lung. EXAM: CT CHEST WITHOUT CONTRAST TECHNIQUE: Multidetector CT imaging of the chest was performed following the standard protocol without IV contrast. COMPARISON:  02/12/2018 FINDINGS: Cardiovascular: The heart size is normal. No substantial pericardial effusion. Coronary artery calcification is evident. Atherosclerotic calcification is noted in the wall of the thoracic aorta. Patient is status post CABG. Right main pulmonary artery measures 3.1 cm diameter. Mediastinum/Nodes: No mediastinal lymphadenopathy. No evidence for gross hilar lymphadenopathy although assessment is limited by the lack of intravenous contrast on today's study. The esophagus has normal imaging features. Moderate hiatal hernia noted. There is no axillary lymphadenopathy. Lungs/Pleura: The central tracheobronchial airways are patent. Moderate to advanced emphysema with bullous change in the upper lobes. 15 x 11 mm peripheral left lower lobe nodule seen previously has become incorporated into a bandlike area of interstitial and airspace opacity in the peripheral left lower lobe, suggesting evolving radiation fibrosis. There is a nodular component today measuring 15 x 20 mm  on 118/7. Trace left pleural effusion. Upper Abdomen: Unremarkable. Musculoskeletal: No worrisome lytic or sclerotic osseous abnormality. T12 superior endplate compression fracture is similar to prior. IMPRESSION: 1. Interval increase in bandlike opacity in the peripheral left lower lobe, incorporating  the irregular nodule seen on the prior study. Imaging features most likely reflect changes from radiation therapy. 2. Prominence of the pulmonary arteries raises the question of pulmonary arterial hypertension. 3. Moderate to advanced Emphysema. (ICD10-J43.9) 4.  Aortic Atherosclerois (ICD10-170.0) 5. Moderate hiatal hernia. Electronically Signed   By: Misty Stanley M.D.   On: 08/28/2018 15:39   Dg Chest Port 1 View  Result Date: 09/13/2018 CLINICAL DATA:  Shortness of breath with pneumothorax EXAM: PORTABLE CHEST 1 VIEW COMPARISON:  Yesterday FINDINGS: When accounting for apical bullous emphysema there is no convincing residual pneumothorax. Left chest tube is seen in stable position. Stable costophrenic sulcus blunting. Normal heart size. Aortic valve replacement. Moderate hiatal hernia. IMPRESSION: No visible residual pneumothorax. Electronically Signed   By: Monte Fantasia M.D.   On: 09/13/2018 07:55   Dg Chest Port 1 View  Result Date: 09/12/2018 CLINICAL DATA:  Pneumothorax EXAM: PORTABLE CHEST 1 VIEW COMPARISON:  Portable exam 1703 hours compared to 09/11/2018 FINDINGS: Pigtail LEFT thoracostomy tube again seen, unchanged. Upper normal heart size post AVR. Atherosclerotic calcification aorta. Emphysematous and bronchitic changes consistent with COPD. Blunting of the costophrenic angles bilaterally by small pleural effusions with associated mild bibasilar atelectasis. Small LEFT apex pneumothorax. Remaining lungs clear. Skin fold projects over RIGHT chest. Bones demineralized. IMPRESSION: Minimal residual LEFT apex pneumothorax. COPD changes with bibasilar atelectasis and small effusions.  Electronically Signed   By: Lavonia Dana M.D.   On: 09/12/2018 17:26   Dg Chest Port 1 View  Result Date: 09/11/2018 CLINICAL DATA:  Left-sided pneumothorax EXAM: PORTABLE CHEST 1 VIEW COMPARISON:  Prior exams dating back through 09/09/2018. FINDINGS: Further decrease in size of tiny left apical pneumothorax, now barely visible. Left-sided chest tube is in place. Chronic stable blunting the left lateral costophrenic angle presumably from small left effusion with adjacent atelectasis. Improved aeration at the left lung base. Heart and mediastinal contours are stable with moderate aortic atherosclerosis. No aneurysm. Median sternotomy sutures are place with aortic valvular replacement and post CABG change. IMPRESSION: Further decrease in size of tiny left apical pneumothorax. Left-sided chest tube is in place. Improved aeration at the left lung base with unchanged presumed small left effusion. Electronically Signed   By: Ashley Royalty M.D.   On: 09/11/2018 17:42   Dg Chest Port 1 View  Result Date: 09/10/2018 CLINICAL DATA:  Followup left pneumothorax and chest tube. EXAM: PORTABLE CHEST 1 VIEW COMPARISON:  Yesterday. FINDINGS: Skin folds are demonstrated on the left. Significant reduction in the left pneumothorax with a less than 5% apical pneumothorax remaining. A small caliber left chest tube remains in place. No significant change in a small left pleural effusion and bibasilar atelectasis or pneumonia. Mild-to-moderate peribronchial thickening is unchanged. Diffuse osteopenia. IMPRESSION: 1. Significant reduction in the left pneumothorax with a less than 5% apical pneumothorax remaining. 2. Stable small left pleural effusion and bibasilar atelectasis or pneumonia. 3. Stable mild to moderate bronchitic changes. Electronically Signed   By: Claudie Revering M.D.   On: 09/10/2018 08:24   Dg Chest Port 1 View  Result Date: 09/09/2018 CLINICAL DATA:  Chest tube, pneumothorax EXAM: PORTABLE CHEST 1 VIEW  COMPARISON:  09/08/2018 FINDINGS: Sternotomy wires overlie normal cardiac silhouette. Persistent small LEFT pneumothorax laterally measures 11 mm from the chest wall. Pneumothorax appears decreased in volume compared to 1 day prior. Pigtail catheter along the LEFT lateral chest wall. Small LEFT effusion. RIGHT lung clear IMPRESSION: 1. Decrease in volume of small LEFT pneumothorax with chest tube in  place 2. Small LEFT effusion. Electronically Signed   By: Suzy Bouchard M.D.   On: 09/09/2018 07:55   Dg Chest Port 1 View  Result Date: 09/08/2018 CLINICAL DATA:  Pneumothorax, chest tube EXAM: PORTABLE CHEST 1 VIEW COMPARISON:  Portable exam 1512 hours compared to 0909 hours FINDINGS: New pigtail LEFT thoracostomy tube. Persistent moderate LEFT pneumothorax estimated 20% though decreased from previous exam. Persistent LEFT pleural effusion. Underlying emphysematous and bronchitic changes consistent with COPD. Skin fold projects over LEFT chest as well. Persistent atelectasis versus infiltrate at RIGHT base. Normal heart size post AVR. Atherosclerotic calcifications aorta. IMPRESSION: Moderate LEFT pneumothorax decreased since pigtail thoracostomy tube placement. COPD changes with LEFT pleural effusion and RIGHT basilar atelectasis versus infiltrate. Electronically Signed   By: Lavonia Dana M.D.   On: 09/08/2018 15:30   Dg Chest Port 1 View  Addendum Date: 09/08/2018   ADDENDUM REPORT: 09/08/2018 10:28 ADDENDUM: Critical Value/emergent results were called by telephone at the time of interpretation on 09/08/2018 at 9:58 a.m. to Dr. Berle Mull , who verbally acknowledged these results. Electronically Signed   By: Marin Olp M.D.   On: 09/08/2018 10:28   Result Date: 09/08/2018 CLINICAL DATA:  Shortness of breath getting better. EXAM: PORTABLE CHEST 1 VIEW COMPARISON:  09/06/2018 FINDINGS: Lungs are adequately inflated with emphysematous disease. There is a new moderate left pneumothorax worse over the  base. Associated atelectasis/collapse in the left base. Persistent interstitial changes over the right base. Cardiomediastinal silhouette and remainder the exam is unchanged. IMPRESSION: New moderate left pneumothorax worse over the left base with associated left lower lobe collapse/atelectasis. Stable mild interstitial changes right base. Emphysema (ICD10-J43.9). Electronically Signed: By: Marin Olp M.D. On: 09/08/2018 09:26   Dg Chest Port 1 View  Result Date: 09/06/2018 CLINICAL DATA:  Shortness of breath EXAM: PORTABLE CHEST 1 VIEW COMPARISON:  09/05/2018, 07/01/2018, 11/14/2017, CT chest 08/28/2018 FINDINGS: Small left-sided pleural effusion. Emphysematous disease. Probable skin fold artifact over the right thorax. Mild interstitial and alveolar opacity at both bases, slight increase. Post sternotomy changes. Aortic atherosclerosis. IMPRESSION: 1. Similar appearance of small left pleural effusion and bibasilar interstitial and hazy airspace disease which may reflect acute infiltrate superimposed on chronic change/post treatment change 2. Emphysematous disease Electronically Signed   By: Donavan Foil M.D.   On: 09/06/2018 19:25    Lab Data:  CBC: Recent Labs  Lab 09/07/18 0504 09/08/18 0227 09/09/18 0216 09/10/18 0234 09/11/18 0246  WBC 13.3* 12.9* 12.9* 11.0* 13.7*  HGB 10.1* 10.3* 10.9* 10.5* 10.6*  HCT 31.3* 33.1* 34.2* 33.0* 33.3*  MCV 91.3 90.7 91.2 91.9 91.0  PLT 239 232 256 253 161   Basic Metabolic Panel: Recent Labs  Lab 09/06/18 2306 09/07/18 0504 09/08/18 0816 09/11/18 0246 09/12/18 0554 09/13/18 0247 09/13/18 0628  NA 138 140 143  --  139 137  --   K 3.6 3.5 3.5  --  3.9 5.9* 4.0  CL 110 108 110  --  103 105  --   CO2 18* 22 23  --  26 22  --   GLUCOSE 172* 185* 133*  --  97 145*  --   BUN 47* 45* 32*  --  33* 33*  --   CREATININE 1.22* 1.25* 1.18*  --  1.22* 1.22*  --   CALCIUM 8.5* 8.0* 9.2  --  8.8* 8.8*  --   MG  --   --   --  1.9 2.0 2.1  --   PHOS   --   --   --  4.2 3.5 3.1  --    GFR: Estimated Creatinine Clearance: 24.6 mL/min (A) (by C-G formula based on SCr of 1.22 mg/dL (H)). Liver Function Tests: No results for input(s): AST, ALT, ALKPHOS, BILITOT, PROT, ALBUMIN in the last 168 hours. No results for input(s): LIPASE, AMYLASE in the last 168 hours. No results for input(s): AMMONIA in the last 168 hours. Coagulation Profile: No results for input(s): INR, PROTIME in the last 168 hours. Cardiac Enzymes: No results for input(s): CKTOTAL, CKMB, CKMBINDEX, TROPONINI in the last 168 hours. BNP (last 3 results) No results for input(s): PROBNP in the last 8760 hours. HbA1C: No results for input(s): HGBA1C in the last 72 hours. CBG: Recent Labs  Lab 09/12/18 1154 09/12/18 1721 09/12/18 2044 09/13/18 0711 09/13/18 1153  GLUCAP 89 299* 268* 126* 219*   Lipid Profile: No results for input(s): CHOL, HDL, LDLCALC, TRIG, CHOLHDL, LDLDIRECT in the last 72 hours. Thyroid Function Tests: No results for input(s): TSH, T4TOTAL, FREET4, T3FREE, THYROIDAB in the last 72 hours. Anemia Panel: No results for input(s): VITAMINB12, FOLATE, FERRITIN, TIBC, IRON, RETICCTPCT in the last 72 hours. Urine analysis:    Component Value Date/Time   COLORURINE YELLOW 03/02/2017 2156   APPEARANCEUR HAZY (A) 03/02/2017 2156   LABSPEC 1.014 03/02/2017 2156   PHURINE 5.0 03/02/2017 2156   GLUCOSEU NEGATIVE 03/02/2017 2156   HGBUR NEGATIVE 03/02/2017 2156   BILIRUBINUR NEGATIVE 03/02/2017 2156   KETONESUR NEGATIVE 03/02/2017 2156   PROTEINUR 30 (A) 03/02/2017 2156   UROBILINOGEN 0.2 07/11/2013 1416   NITRITE NEGATIVE 03/02/2017 2156   LEUKOCYTESUR SMALL (A) 03/02/2017 2156     [name] M.D. [facility] 09/13/2018, 1:06 PM  Pager: [phone] Between 7am to 7pm - call Pager - [phone]  After 7pm go to [email or web address] - password [email or web address]  Call night coverage person covering after 7pm

## 2018-09-13 NOTE — Care Management Note (Signed)
Case Management Note  Patient Details  Name: [name] MRN: [id number] Date of Birth: [birth year]  Subjective/Objective:                    Action/Plan: CM met with the patient to provide her choice of [place] agencies. She asked to go to Outpatient therapy instead of HH. She states she has been to [facility] rehab in the past and she would like to attend there again. CM updated MD.   Expected Discharge Date:                  Expected Discharge Plan:  [place]  In-House Referral:     Discharge planning Services  CM Consult  Post Acute Care Choice:    Choice offered to:     DME Arranged:    DME Agency:     HH Arranged:    [place] Agency:     Status of Service:  In process, will continue to follow  If discussed at Long Length of Stay Meetings, dates discussed:    Additional Comments:  Pollie Friar, RN 09/13/2018, 3:15 PM

## 2018-09-13 NOTE — Care Management Important Message (Signed)
Important Message  Patient Details  Name: [name] MRN: [id number] Date of Birth: 12-05-1931   Medicare Important Message Given:  Yes    Barb Merino Beila Purdie 09/13/2018, 4:39 PM

## 2018-09-13 NOTE — Progress Notes (Signed)
NAME:  [name], MRN:  [id number], DOB:  1932-03-07, LOS: 8 ADMISSION DATE:  09/05/2018, CONSULTATION DATE:  09/08/2018 REFERRING MD:  Marlowe Sax MD, CHIEF COMPLAINT:  Lt pneumothorax   Brief History   84 year old with COPD admitted for acute respiratory failure, multifocal pneumonia, DKA Developed spontaneous pneumothorax on 12/22.  PCCM called for management  Past Medical History  COPD gold stage 2 but severe reducton in dlco (fev may 2019 - 0.8L/60% and dlco 29%), type 2 diabetes, diabetes mellitus, coronary artery disease, GERD, Stage I lung cancer [presumed stage I LLL nodule - empiric XRT ending April 2019).  She follows with Dr. Chase Caller.  History noted for PET positive, left lower lobe spiculated nodule Discussed at multidisciplinary tumor board and biopsy deferred, Underwent SBRT in April 2019  [place] [place] [facility] Events   12/19 > admit 12/22 Lt spontaneous pneumothorax s/p chest tube placement 12/23 Chest tube placed 12/22 remains in place, to suction, without air leak. Patient reports better pain management with changes made to pain regimen by primary team 12/26 chest tube changed to water seal 12/27 chest tube clamped  Consults:  PCCM 12/22 >  Procedures:  Lt chest tube 12/22 >  Significant Diagnostic Tests:  CT chest 08/28/2018- advanced emphysema, left lower lobe lung nodule with bandlike interstitial airspace opacity suggestive of radiation fibrosis. Chest x-ray 12/22 > moderate-sized left pneumothorax CXR 12/23- interval improvement in size of L-sided PTX, however small sized L PTX still present. Pigtail placement unchanged.  CXR 12/26 > no PTX  Micro Data:  Blood culture 12/19- NGTD  Antimicrobials:  Ceftriaxone for 7 days 12/19>> Azithromycin for 5 days 12/19>>  Interim history/subjective:  Comfortable, no complaints.  Denies dyspnea, pain.  Chest tube clamped this AM at 0815.  Objective   Blood pressure (!) 158/89, pulse (!) 56, temperature 97.7  F (36.5 C), temperature source Oral, resp. rate 14, height 5' (1.524 m), weight 45.4 kg, SpO2 97 %.        Intake/Output Summary (Last 24 hours) at 09/13/2018 0830 Last data filed at 09/13/2018 0601 Gross per 24 hour  Intake -  Output 500 ml  Net -500 ml   Filed Weights   09/05/18 1424  Weight: 45.4 kg     General - elderly female, sitting on edge of bed eating, in NAD Eyes - pupils reactive ENT - no sinus tenderness, no stridor Cardiac - regular rate/rhythm, no murmur Chest - decreased BS, no wheeze, Lt chest tube w/o air leak Abdomen - soft, non tender, + bowel sounds Extremities - no cyanosis, clubbing, or edema Skin - no rashes Neuro - normal strength, moves extremities, follows commands Psych - normal mood and behavior  Resolved Hospital Problem list   DKA  Assessment & Plan:  84 year old with advanced emphysema, lung cancer =- presumed LLL and empiric XRT April 2019 AE COPD, PNA, spontaneous left pneumothorax  Left spontaneous pneumothorax. Likely triggered by cough in setting of COPD with emphysema and severe DLCO reduction.  Has ipsilateral XRT in April 2019 for presumed lung cancer.  S/p chest tube placement 12/22. Plan - Chest tube clamped at 0815. - Repeat CXR at 1400. - If no recurrence of PTX, then will d/c chest tube.  Acute on chronic respiratory failure COPD, pneumonia Multifocal PNA appreciated on CXR 09/05/18 Interval improvement per CXRs Plan - continue ABx per primary team. - continue anoro, budesonide, prednisone. - bronchial hygiene.  Rest per primary team.   Montey Hora, PA - [facility] Pager: [phone]  or [phone] 09/13/2018, 8:30 AM

## 2018-09-13 NOTE — Discharge Instructions (Signed)
Use meter to check CBG twice daily. Once first thing in the am and another time during the day. If CBG is below 100mg /dl may want to contact Dr. Dema Severin before taking oral med due to concern for hypoglycemia (low blood sugar).  Record CBG and take with you follow up appt. Call for appointment when you leave the hospital.  Symptoms of Hypoglycemia: Silly, Sweaty, Shaky  Check sugar if you have your meter. If near or less than 70 mg/dl, treat with 1/2 cup juice or soda or take glucose tablets  Check sugar 15 minutes after treatment. If sugar still near or less than 70 mg/al and symptomatic, treat again and may need a snack with some protein (peanut butter with crackers, etc)

## 2018-09-13 NOTE — Progress Notes (Signed)
Physical Therapy Treatment Patient Details Name: [name] MRN: [id number] DOB: 1932-03-21 Today's Date: 09/13/2018    History of Present Illness Patient is a 84 y/o female who presents from PCP office with concern for PNA- reports SOB, cough for a few days. CXR- multifocal PNA. Found to have acute on chronic respiratory failure secondary to COPD and PNA. Also DKA.  L chest tube inserted 09/08/18.  PMH includes COPD, depression, HTN, DM, CAD s/p CABG.    PT Comments    Patient progressing well with therapy, ambulating further distances with supervision and use of RW. SpO2 WNL on 2L, will focus on stairs next visit. Updating recs for OP PT as patient is progressing well and could benefit from higher level therapy.    Follow Up Recommendations  Outpatient PT;Supervision for mobility/OOB     Equipment Recommendations  None recommended by PT    Recommendations for Other Services       Precautions / Restrictions Precautions Precautions: Fall;Other (comment) Precaution Comments: monitor O2 sats with mobility Required Braces or Orthoses: Other Brace Other Brace: L side chest tube Restrictions Weight Bearing Restrictions: No    Mobility  Bed Mobility Overal bed mobility: Modified Independent             General bed mobility comments: assist for chest tube and lines  Transfers Overall transfer level: Needs assistance Equipment used: Rolling walker (2 wheeled) Transfers: Sit to/from Stand Sit to Stand: Supervision            Ambulation/Gait Ambulation/Gait assistance: Supervision Gait Distance (Feet): 250 Feet Assistive device: Rolling walker (2 wheeled) Gait Pattern/deviations: Step-through pattern;Shuffle;Trunk flexed Gait velocity: decreased   General Gait Details: pt ambulating further distances, no DOE, conversational throughout SpO2 WNL on 2L   Stairs             Wheelchair Mobility    Modified Rankin (Stroke Patients Only)        Balance Overall balance assessment: Needs assistance Sitting-balance support: Feet supported;Bilateral upper extremity supported Sitting balance-Leahy Scale: Good     Standing balance support: No upper extremity supported;Bilateral upper extremity supported;Single extremity supported Standing balance-Leahy Scale: Fair Standing balance comment: Reliant on BUEs for support in standing.                             Cognition Arousal/Alertness: Awake/alert Behavior During Therapy: WFL for tasks assessed/performed Overall Cognitive Status: Within Functional Limits for tasks assessed                                 General Comments: pleasent       Exercises      General Comments        Pertinent Vitals/Pain Pain Assessment: Faces Faces Pain Scale: Hurts a little bit Pain Location: chest tube site left chest wall Pain Descriptors / Indicators: Grimacing;Guarding Pain Intervention(s): Limited activity within patient's tolerance;Monitored during session    Home Living                      Prior Function            PT Goals (current goals can now be found in the care plan section) Acute Rehab PT Goals Patient Stated Goal: to go home soon.  Breathe better PT Goal Formulation: With patient Time For Goal Achievement: 09/22/18 Potential to Achieve Goals: Good Progress towards PT  goals: Progressing toward goals    Frequency    Min 3X/week      PT Plan Current plan remains appropriate    Co-evaluation              AM-PAC PT "6 Clicks" Mobility   Outcome Measure  Help needed turning from your back to your side while in a flat bed without using bedrails?: None Help needed moving from lying on your back to sitting on the side of a flat bed without using bedrails?: None Help needed moving to and from a bed to a chair (including a wheelchair)?: A Little Help needed standing up from a chair using your arms (e.g., wheelchair or  bedside chair)?: A Little Help needed to walk in hospital room?: None Help needed climbing 3-5 steps with a railing? : A Little 6 Click Score: 21    End of Session Equipment Utilized During Treatment: Gait belt;Oxygen Activity Tolerance: Patient tolerated treatment well Patient left: in bed;Other (comment);with family/visitor present Nurse Communication: Mobility status PT Visit Diagnosis: Difficulty in walking, not elsewhere classified (R26.2);Unsteadiness on feet (R26.81);History of falling (Z91.81)     Time: 9449-6759 PT Time Calculation (min) (ACUTE ONLY): 23 min  Charges:  $Gait Training: 23-37 mins                     Reinaldo Berber, PT, DPT Acute Rehabilitation Services Pager: [phone] Office: [phone]     Reinaldo Berber 09/13/2018, 4:19 PM

## 2018-09-13 NOTE — Progress Notes (Signed)
Nutrition Follow Up  DOCUMENTATION CODES:   Severe malnutrition in context of chronic illness  INTERVENTION:    Glucerna Shake po TID, each supplement provides 220 kcal and 10 grams of protein  NUTRITION DIAGNOSIS:   Severe Malnutrition related to chronic illness as evidenced by severe muscle depletion, severe fat depletion, energy intake < 75% for > or equal to 3 months, ongoing  GOAL:   Patient will meet greater than or equal to 90% of their needs, progressing  MONITOR:   PO intake, Supplement acceptance, Labs, Weight trends  ASSESSMENT:   84 yo female, admitted with acute on chronic respiratory failure with hypoxia. PMH significant for COPD on 2 L/h O2 at home, HTN, DM, HLD, IBS, GERD, former smoker.   Pt eating lunch upon RD visit today. Reports a good appetite. PO intake 100% per flowsheets. She doesn't particularly like Glucerna Shakes but is willing to drink. Labs & medications reviewed. CBG's [phone].  Diet Order: Diet Order            Diet Carb Modified Fluid consistency: Thin; Room service appropriate? Yes  Diet effective now             EDUCATION NEEDS:   No education needs have been identified at this time  Skin:  Skin Assessment: Skin Integrity Issues: Skin Integrity Issues:: Other (Comment) Other: ecchymosis BL hands, legs  Last BM:  12/23  Height:   Ht Readings from Last 1 Encounters:  09/05/18 5' (1.524 m)   Weight:   Wt Readings from Last 1 Encounters:  09/05/18 45.4 kg   Ideal Body Weight:  45.5 kg  BMI:  Body mass index is 19.53 kg/m.  Estimated Nutritional Needs:   Kcal:  1250-1450  Protein:  60-75 gm  Fluid:  >/= 1.5 L  Arthur Holms, RD, LDN Pager #: [phone] After-Hours Pager #: [phone]

## 2018-09-14 ENCOUNTER — Inpatient Hospital Stay (HOSPITAL_COMMUNITY): Payer: Medicare Other

## 2018-09-14 LAB — BASIC METABOLIC PANEL
Anion gap: 8 (ref 5–15)
BUN: 24 mg/dL — ABNORMAL HIGH (ref 8–23)
CHLORIDE: 103 mmol/L (ref 98–111)
CO2: 29 mmol/L (ref 22–32)
Calcium: 8.6 mg/dL — ABNORMAL LOW (ref 8.9–10.3)
Creatinine, Ser: 1.14 mg/dL — ABNORMAL HIGH (ref 0.44–1.00)
GFR calc Af Amer: 51 mL/min — ABNORMAL LOW (ref >60)
GFR calc non Af Amer: 44 mL/min — ABNORMAL LOW (ref >60)
GLUCOSE: 79 mg/dL (ref 70–99)
Potassium: 3.3 mmol/L — ABNORMAL LOW (ref 3.5–5.1)
Sodium: 140 mmol/L (ref 135–145)

## 2018-09-14 LAB — MAGNESIUM: Magnesium: 1.9 mg/dL (ref 1.7–2.4)

## 2018-09-14 LAB — GLUCOSE, CAPILLARY
Glucose-Capillary: 103 mg/dL — ABNORMAL HIGH (ref 70–99)
Glucose-Capillary: 266 mg/dL — ABNORMAL HIGH (ref 70–99)
Glucose-Capillary: 282 mg/dL — ABNORMAL HIGH (ref 70–99)
Glucose-Capillary: 303 mg/dL — ABNORMAL HIGH (ref 70–99)

## 2018-09-14 LAB — PHOSPHORUS: Phosphorus: 3.2 mg/dL (ref 2.5–4.6)

## 2018-09-14 MED ORDER — TRAZODONE HCL 50 MG PO TABS
25.0000 mg | ORAL_TABLET | Freq: Every evening | ORAL | Status: DC | PRN
  Administered 2018-09-14: 25 mg via ORAL
  Filled 2018-09-14: qty 1

## 2018-09-14 NOTE — Progress Notes (Signed)
NAME:  [name], MRN:  [id number], DOB:  [birth year], LOS: 9 ADMISSION DATE:  09/05/2018, CONSULTATION DATE:  09/08/2018 REFERRING MD:  Marlowe Sax MD, CHIEF COMPLAINT:  Lt pneumothorax   Brief History   84 year old with COPD admitted for acute respiratory failure, multifocal pneumonia, DKA Developed spontaneous pneumothorax on 12/22.  PCCM called for management  Past Medical History  COPD gold stage 2 but severe reducton in dlco (fev may 2019 - 0.8L/60% and dlco 29%), type 2 diabetes, diabetes mellitus, coronary artery disease, GERD, Stage I lung cancer [presumed stage I LLL nodule - empiric XRT ending April 2019).  She follows with Dr. Chase Caller.  History noted for PET positive, left lower lobe spiculated nodule Discussed at multidisciplinary tumor board and biopsy deferred, Underwent SBRT in April 2019  [facility] Events   12/19 > admit 12/22 Lt spontaneous pneumothorax s/p chest tube placement 12/23 Chest tube placed 12/22 remains in place, to suction, without air leak. Patient reports better pain management with changes made to pain regimen by primary team 12/26 chest tube changed to water seal 12/27 chest tube clamped  Consults:  PCCM 12/22 >  Procedures:  Lt chest tube 12/22 >  Significant Diagnostic Tests:  CT chest 08/28/2018- advanced emphysema, left lower lobe lung nodule with bandlike interstitial airspace opacity suggestive of radiation fibrosis. Chest x-ray 12/22 > moderate-sized left pneumothorax CXR 12/23- interval improvement in size of L-sided PTX, however small sized L PTX still present. Pigtail placement unchanged.  CXR 12/26 > no PTX  Micro Data:  Blood culture 12/19- NGTD  Antimicrobials:  Ceftriaxone for 7 days 12/19>> Azithromycin for 5 days 12/19>>  Interim history/subjective:  Sitting on side of bed eating lunch Remains on 2 L with sats 100% Comfortable, no complaints.  Denies dyspnea, pain.  Chest tube clamped this afternoon at  12:15>> patient RN aware Told to call RN for any SOB or chest pain CXR for 16:15 to reassess for pneumo.  Objective   Blood pressure (!) 149/62, pulse (!) 54, temperature 98.1 F (36.7 C), temperature source Oral, resp. rate 14, height 5' (1.524 m), weight 45.4 kg, SpO2 100 %.        Intake/Output Summary (Last 24 hours) at 09/14/2018 1208 Last data filed at 09/14/2018 7096 Gross per 24 hour  Intake -  Output 933 ml  Net -933 ml   Filed Weights   09/05/18 1424  Weight: 45.4 kg     General - elderly female, sitting on edge of bed eating, in NAD ENT - no sinus tenderness, no stridor, no LAD Cardiac - S1, S2, regular rate/rhythm, no murmur Chest - decreased BS on left, , no wheeze, Lt chest tube w/o air leak to water seal Abdomen - soft, non tender, + bowel sounds Extremities - no cyanosis, clubbing, or edema Skin - no rashes, lesions Neuro - normal strength, moves extremities, follows commands, appropriate and conversant Psych - normal mood and behavior, very pleasant  Resolved Hospital Problem list   DKA  Assessment & Plan:  84 year old with advanced emphysema, lung cancer =- presumed LLL and empiric XRT April 2019 AE COPD, PNA, spontaneous left pneumothorax  Left spontaneous pneumothorax. Likely triggered by cough in setting of COPD with emphysema and severe DLCO reduction.  Has ipsilateral XRT in April 2019 for presumed lung cancer.  S/p chest tube placement 12/22. Plan - Chest tube clamped at 12:15. - Repeat CXR at 16:15 - If no recurrence of PTX, then will d/c chest tube.  Acute on  chronic respiratory failure COPD, pneumonia Multifocal PNA appreciated on CXR 09/05/18 Interval improvement per CXRs Plan - continue ABX per primary team. - continue anoro, budesonide, prednisone. - bronchial hygiene. - Mobilize as able  Rest per primary team.   Magdalen Spatz, AGACNP-BC [facility] Pager: [phone]  or  After 3 pm [phone]  [phone] 09/14/2018, 12:08 PM

## 2018-09-14 NOTE — Progress Notes (Signed)
Physical Therapy Treatment Patient Details Name: [name] MRN: [id number] DOB: 28-Jun-1932 Today's Date: 09/14/2018    History of Present Illness Patient is a 84 y/o female who presents from PCP office with concern for PNA- reports SOB, cough for a few days. CXR- multifocal PNA. Found to have acute on chronic respiratory failure secondary to COPD and PNA. Also DKA.  L chest tube inserted 09/08/18.  PMH includes COPD, depression, HTN, DM, CAD s/p CABG.    PT Comments    Patient progressing well, session focused on stair training. Pt safely ambulating stairs at this time, updating recs to OP PT as she has progressed past HHPT and will benefit from higher level therapy. No DOE with activity today on 2L.     Follow Up Recommendations  Supervision for mobility/OOB;Outpatient PT     Equipment Recommendations  None recommended by PT    Recommendations for Other Services       Precautions / Restrictions Precautions Precautions: Fall;Other (comment) Precaution Comments: monitor O2 sats with mobility Required Braces or Orthoses: Other Brace Other Brace: L side chest tube Restrictions Weight Bearing Restrictions: No    Mobility  Bed Mobility Overal bed mobility: Modified Independent                Transfers Overall transfer level: Modified independent Equipment used: Rolling walker (2 wheeled) Transfers: Sit to/from Stand Sit to Stand: Modified independent (Device/Increase time)            Ambulation/Gait Ambulation/Gait assistance: Supervision Gait Distance (Feet): 50 Feet   Gait Pattern/deviations: Step-through pattern;Shuffle;Trunk flexed Gait velocity: decreased   General Gait Details: ambulation to stairwell and back   Stairs Stairs: Yes Stairs assistance: Supervision;Min guard Stair Management: One rail Right;Alternating pattern Number of Stairs: 6 General stair comments: pt ambulating stairs with alternating patteern and good balance/safety  with use or R rail   Wheelchair Mobility    Modified Rankin (Stroke Patients Only)       Balance Overall balance assessment: Needs assistance Sitting-balance support: Feet supported;Bilateral upper extremity supported Sitting balance-Leahy Scale: Good     Standing balance support: No upper extremity supported;Bilateral upper extremity supported;Single extremity supported Standing balance-Leahy Scale: Fair Standing balance comment: Reliant on BUEs for support in standing.                             Cognition Arousal/Alertness: Awake/alert Behavior During Therapy: WFL for tasks assessed/performed Overall Cognitive Status: Within Functional Limits for tasks assessed                                 General Comments: pleasent       Exercises      General Comments        Pertinent Vitals/Pain Pain Assessment: No/denies pain    Home Living                      Prior Function            PT Goals (current goals can now be found in the care plan section) Acute Rehab PT Goals Patient Stated Goal: to go home soon.  Breathe better PT Goal Formulation: With patient Time For Goal Achievement: 09/22/18 Potential to Achieve Goals: Good Progress towards PT goals: Progressing toward goals    Frequency    Min 3X/week      PT Plan Current  plan remains appropriate    Co-evaluation              AM-PAC PT "6 Clicks" Mobility   Outcome Measure  Help needed turning from your back to your side while in a flat bed without using bedrails?: None Help needed moving from lying on your back to sitting on the side of a flat bed without using bedrails?: None Help needed moving to and from a bed to a chair (including a wheelchair)?: None Help needed standing up from a chair using your arms (e.g., wheelchair or bedside chair)?: A Little Help needed to walk in hospital room?: A Little Help needed climbing 3-5 steps with a railing? : A  Little 6 Click Score: 21    End of Session Equipment Utilized During Treatment: Gait belt;Oxygen Activity Tolerance: Patient tolerated treatment well Patient left: in bed;Other (comment);with family/visitor present Nurse Communication: Mobility status PT Visit Diagnosis: Difficulty in walking, not elsewhere classified (R26.2);Unsteadiness on feet (R26.81);History of falling (Z91.81)     Time: 8485-9276 PT Time Calculation (min) (ACUTE ONLY): 26 min  Charges:  $Gait Training: 8-22 mins                    Reinaldo Berber, PT, DPT Acute Rehabilitation Services Pager: [phone] Office: [phone]     Reinaldo Berber 09/14/2018, 11:16 AM

## 2018-09-14 NOTE — Progress Notes (Signed)
PROGRESS NOTE  [name] QVZ:[id number] DOB: 1931-12-31 DOA: 09/05/2018 PCP: Harlan Stains, MD   LOS: 9 days   Brief Narrative / Interim history: 84 year old female with reduced DLCO, COPD, diabetes mellitus type 2, malnutrition, stage I lung cancer presumed stage I left lower lobe nodule with empiric radiation therapy finished in April 2019 was admitted with shortness of breath, diagnosed with COPD exacerbation with multifocal pneumonia and DKA.  Because of cough she developed spontaneous pneumothorax on 12/22, pulmonology was consulted and she had a chest tube placed.  Subjective: Feeling better, denies any shortness of breath, able to ambulate in the hallway with physical therapy without difficulties.  Still has a little bit of a cough but not much  Assessment & Plan: Principal Problem:   Acute on chronic respiratory failure with hypoxia (HCC) Active Problems:   Essential hypertension   S/P CABG x 1   Recurrent pneumonia   Pulmonary HTN (HCC)   COPD with acute exacerbation (HCC)   Pneumonia   Protein-calorie malnutrition, severe   Pneumothorax on left   Principal Problem Acute on chronic respiratory failure with hypoxia secondary to multifocal pneumonia, COPD exacerbation, acute pneumothorax in the setting of severe reduction in DLCO, stage I lung CA -Patient actually presented with shortness of breath, coughing, initial chest x-ray showed bilateral lower lobe pneumonia -Patient was started on IV antibiotics, Zithromax and IV Rocephin and she completed a course while hospitalized with antibiotics discontinued on 12/26 -Urine strep and antigen negative, blood cultures negative.  She was also started on IV Solu-Medrol, subsequently transitioned to oral prednisone -Patient developed spontaneous left pneumothorax on 12/22. Pulmonology was consulted, underwent chest tube placement. -Chest tube managed by pulmonary, status post trial of clamping on 12/27 but repeat chest x-ray in  the afternoon showed slightly increased pneumothorax and he was placed back to suction.  Reevaluate today by pulmonology, appreciate input  Additional Problems Type 2 diabetes mellitus, uncontrolled with hyperglycemia, DKA -CBGs worsened secondary to steroids, subsequently causing high anion gap metabolic acidosis.  Lactic acid was normal.  Patient has a history of type 2 diabetes mellitus and was recently taken off of for metformin. -Patient was placed on DKA protocol with IV fluids, IV insulin, now transitioned to subcu insulin -CBGs fairly stable today with fasting 103 this morning, continue current regimen -Patient will need glipizide or Amaryl at discharge for short-term to cover for steroid-induced hyperglycemia along with metformin  Chronic diastolic CHF -Euvolemic, Initially Lasix and Avapro were held due to acute kidney injury -Creatinine now 1.14  Accelerated hypertension -Continue amlodipine, clonidine, Avapro, placed on hydralazine, blood pressure stable  Dyslipidemia -Continue Pravachol  Severe protein calorie malnutrition -Dietitian consulted, continue nutritional supplement  Hyperkalemia -On 12/26, status post Kayexalate, this morning 3.3, continue to monitor  Scheduled Meds: . amLODipine  10 mg Oral Daily  . aspirin EC  81 mg Oral QHS  . benzonatate  100 mg Oral TID  . budesonide (PULMICORT) nebulizer solution  0.5 mg Nebulization BID  . buPROPion  150 mg Oral Daily  . cholecalciferol  1,000 Units Oral QHS  . cloNIDine  0.1 mg Oral Q8H  . dextromethorphan-guaiFENesin  1 tablet Oral BID  . enoxaparin (LOVENOX) injection  30 mg Subcutaneous Q24H  . feeding supplement (GLUCERNA SHAKE)  237 mL Oral TID BM  . ferrous sulfate  325 mg Oral BID  . hydrALAZINE  10 mg Oral Q8H  . insulin aspart  0-5 Units Subcutaneous QHS  . insulin aspart  0-9 Units Subcutaneous  TID WC  . insulin glargine  10 Units Subcutaneous Daily  . pravastatin  20 mg Oral QHS  . predniSONE   50 mg Oral Q breakfast  . sertraline  100 mg Oral q morning - 10a  . umeclidinium-vilanterol  1 puff Inhalation Daily   Continuous Infusions: PRN Meds:.acetaminophen, albuterol, antiseptic oral rinse, guaiFENesin, hydrALAZINE, menthol-cetylpyridinium, oxyCODONE-acetaminophen **AND** oxyCODONE  DVT prophylaxis: Lovenox Code Status: Full code Family Communication: No family present at bedside Disposition Plan: Home when cleared by pulmonology  Consultants:   Pulmonary  Procedures:   Chest tube placement on 12/22  Antimicrobials:  Ceftriaxone/azithromycin 12/19 >>/26  Objective: Vitals:   09/13/18 2320 09/14/18 0503 09/14/18 0719 09/14/18 0731  BP: (!) 138/53 (!) 149/85  (!) 149/62  Pulse: (!) 58 76  (!) 54  Resp: 18 (!) 21  14  Temp: 97.8 F (36.6 C) 98.3 F (36.8 C)  98.1 F (36.7 C)  TempSrc: Oral Oral  Oral  SpO2: 97% 92% 98% 100%  Weight:      Height:        Intake/Output Summary (Last 24 hours) at 09/14/2018 1152 Last data filed at 09/14/2018 7106 Gross per 24 hour  Intake -  Output 933 ml  Net -933 ml   Filed Weights   09/05/18 1424  Weight: 45.4 kg    Examination:  Constitutional: NAD, frail appearing Caucasian female Eyes: PERRL, lids and conjunctivae normal ENMT: Mucous membranes are moist.  Neck: normal, supple Respiratory: Moves air well, overall distant breath sounds, no wheezing or crackles heard.  Normal respiratory effort. Cardiovascular: Regular rate and rhythm, no murmurs / rubs / gallops. No LE edema. 2+ pedal pulses. Abdomen: no tenderness. Bowel sounds positive.  Musculoskeletal: no clubbing / cyanosis. Skin: no rashes Neurologic: CN 2-12 grossly intact. Strength 5/5 in all 4.  Psychiatric: Normal judgment and insight. Alert and oriented x 3. Normal mood.    Data Reviewed: I have independently reviewed following labs and imaging studies   CBC: Recent Labs  Lab 09/08/18 0227 09/09/18 0216 09/10/18 0234 09/11/18 0246  WBC  12.9* 12.9* 11.0* 13.7*  HGB 10.3* 10.9* 10.5* 10.6*  HCT 33.1* 34.2* 33.0* 33.3*  MCV 90.7 91.2 91.9 91.0  PLT 232 256 253 269   Basic Metabolic Panel: Recent Labs  Lab 09/08/18 0816 09/11/18 0246 09/12/18 0554 09/13/18 0247 09/13/18 0628 09/14/18 0227  NA 143  --  139 137  --  140  K 3.5  --  3.9 5.9* 4.0 3.3*  CL 110  --  103 105  --  103  CO2 23  --  26 22  --  29  GLUCOSE 133*  --  97 145*  --  79  BUN 32*  --  33* 33*  --  24*  CREATININE 1.18*  --  1.22* 1.22*  --  1.14*  CALCIUM 9.2  --  8.8* 8.8*  --  8.6*  MG  --  1.9 2.0 2.1  --  1.9  PHOS  --  4.2 3.5 3.1  --  3.2   GFR: Estimated Creatinine Clearance: 26.3 mL/min (A) (by C-G formula based on SCr of 1.14 mg/dL (H)). Liver Function Tests: No results for input(s): AST, ALT, ALKPHOS, BILITOT, PROT, ALBUMIN in the last 168 hours. No results for input(s): LIPASE, AMYLASE in the last 168 hours. No results for input(s): AMMONIA in the last 168 hours. Coagulation Profile: No results for input(s): INR, PROTIME in the last 168 hours. Cardiac Enzymes: No results for input(s):  CKTOTAL, CKMB, CKMBINDEX, TROPONINI in the last 168 hours. BNP (last 3 results) No results for input(s): PROBNP in the last 8760 hours. HbA1C: No results for input(s): HGBA1C in the last 72 hours. CBG: Recent Labs  Lab 09/13/18 0711 09/13/18 1153 09/13/18 1640 09/13/18 2112 09/14/18 0730  GLUCAP 126* 219* 278* 207* 103*   Lipid Profile: No results for input(s): CHOL, HDL, LDLCALC, TRIG, CHOLHDL, LDLDIRECT in the last 72 hours. Thyroid Function Tests: No results for input(s): TSH, T4TOTAL, FREET4, T3FREE, THYROIDAB in the last 72 hours. Anemia Panel: No results for input(s): VITAMINB12, FOLATE, FERRITIN, TIBC, IRON, RETICCTPCT in the last 72 hours. Urine analysis:    Component Value Date/Time   COLORURINE YELLOW 03/02/2017 2156   APPEARANCEUR HAZY (A) 03/02/2017 2156   LABSPEC 1.014 03/02/2017 2156   PHURINE 5.0 03/02/2017 2156    GLUCOSEU NEGATIVE 03/02/2017 2156   HGBUR NEGATIVE 03/02/2017 2156   BILIRUBINUR NEGATIVE 03/02/2017 2156   [place] 03/02/2017 2156   PROTEINUR 30 (A) 03/02/2017 2156   UROBILINOGEN 0.2 07/11/2013 1416   NITRITE NEGATIVE 03/02/2017 2156   LEUKOCYTESUR SMALL (A) 03/02/2017 2156   Sepsis Labs: Invalid input(s): PROCALCITONIN, LACTICIDVEN  Recent Results (from the past 240 hour(s))  Culture, blood (routine x 2) Call MD if unable to obtain prior to antibiotics being given     Status: None   Collection Time: 09/05/18  9:00 PM  Result Value Ref Range Status   Specimen Description BLOOD RIGHT HAND  Final   Special Requests   Final    BOTTLES DRAWN AEROBIC ONLY Blood Culture adequate volume   Culture   Final    NO GROWTH 5 DAYS Performed at [facility], [address] [place], [place] [address]    Report Status 09/10/2018 FINAL  Final  Culture, blood (routine x 2) Call MD if unable to obtain prior to antibiotics being given     Status: None   Collection Time: 09/05/18  9:14 PM  Result Value Ref Range Status   Specimen Description BLOOD LEFT HAND  Final   Special Requests   Final    BOTTLES DRAWN AEROBIC ONLY Blood Culture adequate volume   Culture   Final    NO GROWTH 5 DAYS Performed at [facility], [place] [address] [place], [place] [address]    Report Status 09/10/2018 FINAL  Final      Radiology Studies: Dg Chest Port 1 View  Result Date: 09/14/2018 CLINICAL DATA:  Follow-up pneumothorax EXAM: PORTABLE CHEST 1 VIEW COMPARISON:  Second film from yesterday FINDINGS: Left apical pneumothorax on prior is no longer seen. Skin fold seen at the lateral bases on both sides. Small left pleural effusion. Atelectasis at both bases. Normal heart size. Prior aortic valve replacement. IMPRESSION: Apical pneumothorax on prior is no longer seen. Otherwise stable. Electronically Signed   By: Monte Fantasia M.D.   On: 09/14/2018 07:46   Dg Chest Port 1 View  Result  Date: 09/13/2018 CLINICAL DATA:  Follow-up LEFT pneumothorax. Small caliber LEFT chest tube in place but recently clamped. Current history of LEFT LOWER LOBE lung cancer treated with radiation therapy. EXAM: PORTABLE CHEST 1 VIEW 1:08 p.m.: COMPARISON:  Chest x-ray earlier same day 7:22 a.m., yesterday and previously. FINDINGS: The RIGHT apical pneumothorax has increased slightly since the examination earlier today and yesterday, still on the order of 5% or so. Small caliber LEFT chest tube remains in place. Bullous emphysematous changes in the UPPER lobes, LEFT greater than RIGHT  as noted previously. Pleuroparenchymal scarring in the bases, LEFT greater than RIGHT. No new pulmonary parenchymal abnormalities. Prior sternotomy for aortic valve replacement and CABG. Cardiac silhouette upper normal in size, unchanged. Thoracic aorta atherosclerotic, unchanged. Prominent central pulmonary arteries, unchanged. IMPRESSION: 1. Slight interval increase in the RIGHT apical pneumothorax since the examination earlier today, still on the order of 5% or so, with small caliber LEFT chest tube in place. 2. Stable pleuroparenchymal scarring in the bases, LEFT greater than RIGHT. 3.  Emphysema. (ICD10-J43.9) 4. No new abnormalities. Electronically Signed   By: Evangeline Dakin M.D.   On: 09/13/2018 13:38   Dg Chest Port 1 View  Result Date: 09/13/2018 CLINICAL DATA:  Shortness of breath with pneumothorax EXAM: PORTABLE CHEST 1 VIEW COMPARISON:  Yesterday FINDINGS: When accounting for apical bullous emphysema there is no convincing residual pneumothorax. Left chest tube is seen in stable position. Stable costophrenic sulcus blunting. Normal heart size. Aortic valve replacement. Moderate hiatal hernia. IMPRESSION: No visible residual pneumothorax. Electronically Signed   By: Monte Fantasia M.D.   On: 09/13/2018 07:55   Dg Chest Port 1 View  Result Date: 09/12/2018 CLINICAL DATA:  Pneumothorax EXAM: PORTABLE CHEST 1 VIEW  COMPARISON:  Portable exam 1703 hours compared to 09/11/2018 FINDINGS: Pigtail LEFT thoracostomy tube again seen, unchanged. Upper normal heart size post AVR. Atherosclerotic calcification aorta. Emphysematous and bronchitic changes consistent with COPD. Blunting of the costophrenic angles bilaterally by small pleural effusions with associated mild bibasilar atelectasis. Small LEFT apex pneumothorax. Remaining lungs clear. Skin fold projects over RIGHT chest. Bones demineralized. IMPRESSION: Minimal residual LEFT apex pneumothorax. COPD changes with bibasilar atelectasis and small effusions. Electronically Signed   By: Lavonia Dana M.D.   On: 09/12/2018 17:26    Marzetta Board, MD, PhD [facility] Pager [phone]  If 7PM-7AM, please contact night-coverage [email or web address] Password [facility] 09/14/2018, 11:52 AM

## 2018-09-15 ENCOUNTER — Inpatient Hospital Stay (HOSPITAL_COMMUNITY): Payer: Medicare Other

## 2018-09-15 DIAGNOSIS — J9383 Other pneumothorax: Secondary | ICD-10-CM

## 2018-09-15 LAB — BASIC METABOLIC PANEL
Anion gap: 11 (ref 5–15)
BUN: 31 mg/dL — AB (ref 8–23)
CO2: 25 mmol/L (ref 22–32)
Calcium: 8.9 mg/dL (ref 8.9–10.3)
Chloride: 102 mmol/L (ref 98–111)
Creatinine, Ser: 1.41 mg/dL — ABNORMAL HIGH (ref 0.44–1.00)
GFR calc Af Amer: 40 mL/min — ABNORMAL LOW (ref >60)
GFR calc non Af Amer: 34 mL/min — ABNORMAL LOW (ref >60)
Glucose, Bld: 170 mg/dL — ABNORMAL HIGH (ref 70–99)
Potassium: 3.8 mmol/L (ref 3.5–5.1)
Sodium: 138 mmol/L (ref 135–145)

## 2018-09-15 LAB — GLUCOSE, CAPILLARY
Glucose-Capillary: 127 mg/dL — ABNORMAL HIGH (ref 70–99)
Glucose-Capillary: 151 mg/dL — ABNORMAL HIGH (ref 70–99)
Glucose-Capillary: 302 mg/dL — ABNORMAL HIGH (ref 70–99)
Glucose-Capillary: 317 mg/dL — ABNORMAL HIGH (ref 70–99)

## 2018-09-15 LAB — CBC
HCT: 35.2 % — ABNORMAL LOW (ref 36.0–46.0)
Hemoglobin: 10.8 g/dL — ABNORMAL LOW (ref 12.0–15.0)
MCH: 27.9 pg (ref 26.0–34.0)
MCHC: 30.7 g/dL (ref 30.0–36.0)
MCV: 91 fL (ref 80.0–100.0)
Platelets: 318 10*3/uL (ref 150–400)
RBC: 3.87 MIL/uL (ref 3.87–5.11)
RDW: 13.7 % (ref 11.5–15.5)
WBC: 14.3 10*3/uL — ABNORMAL HIGH (ref 4.0–10.5)
nRBC: 0 % (ref 0.0–0.2)

## 2018-09-15 MED ORDER — PREDNISONE 20 MG PO TABS
40.0000 mg | ORAL_TABLET | Freq: Every day | ORAL | Status: DC
  Administered 2018-09-16 – 2018-09-17 (×2): 40 mg via ORAL
  Filled 2018-09-15 (×3): qty 2

## 2018-09-15 NOTE — Progress Notes (Addendum)
NAME:  [name], MRN:  [id number], DOB:  Mar 06, 1932, LOS: 32 ADMISSION DATE:  09/05/2018, CONSULTATION DATE:  09/08/2018 REFERRING MD:  Marlowe Sax MD, CHIEF COMPLAINT:  Lt pneumothorax   Brief History   84 year old with COPD admitted for acute respiratory failure, multifocal pneumonia, DKA Developed spontaneous pneumothorax on 12/22.  PCCM called for management  Past Medical History  COPD gold stage 2 but severe reducton in dlco (fev may 2019 - 0.8L/60% and dlco 29%), type 2 diabetes, diabetes mellitus, coronary artery disease, GERD, Stage I lung cancer [presumed stage I LLL nodule - empiric XRT ending April 2019).  She follows with Dr. Chase Caller.  History noted for PET positive, left lower lobe spiculated nodule Discussed at multidisciplinary tumor board and biopsy deferred, Underwent SBRT in April 2019  [facility] Events   12/19 > admit 12/22 Lt spontaneous pneumothorax s/p chest tube placement 12/23 Chest tube placed 12/22 remains in place, to suction, without air leak. Patient reports better pain management with changes made to pain regimen by primary team 12/26 chest tube changed to water seal 12/27 chest tube clamped  Consults:  PCCM 12/22 >  Procedures:  Lt chest tube 12/22 >  Significant Diagnostic Tests:  CT chest 08/28/2018- advanced emphysema, left lower lobe lung nodule with bandlike interstitial airspace opacity suggestive of radiation fibrosis. Chest x-ray 12/22 > moderate-sized left pneumothorax CXR 12/23- interval improvement in size of L-sided PTX, however small sized L PTX still present. Pigtail placement unchanged.  CXR 12/26 > no PTX  Micro Data:  Blood culture 12/19- NGTD  Antimicrobials:  Ceftriaxone for 7 days 12/19>> Azithromycin for 5 days 12/19>>  Interim history/subjective:  Patient says she is doing better.  Eating well.  O2 saturations 100% on 2 L. She denies any chest pain.  Chest tube remains clamped.  Chest x-ray this a.m. is  pending.  Objective   Blood pressure (!) 163/51, pulse (!) 48, temperature (!) 97.5 F (36.4 C), temperature source Oral, resp. rate 14, height 5' (1.524 m), weight 45.4 kg, SpO2 100 %.        Intake/Output Summary (Last 24 hours) at 09/15/2018 0805 Last data filed at 09/15/2018 0400 Gross per 24 hour  Intake -  Output 0 ml  Net 0 ml   Filed Weights   09/05/18 1424  Weight: 45.4 kg     General -elderly female sitting up in bed in NAD  ENT -AT, [place], no stridor no LAD  Cardiac -S1, S2, RRR, and no MRG  Chest -decreased breath sounds in the bases.  No wheezing noted.  Left chest tube clamped  Abdomen -soft nontender, positive bowel sounds  Extremities -moves all extremities well, no edema Skin -no rashes skin intact Neuro -normal strength.  Follows commands appropriate  Psych -pleasant  Resolved Hospital Problem list   DKA  Assessment & Plan:  84 year old with advanced emphysema, lung cancer =- presumed LLL and empiric XRT April 2019 AE COPD, PNA, spontaneous left pneumothorax  Left spontaneous pneumothorax. Likely triggered by cough in setting of COPD with emphysema and severe DLCO reduction.  Has ipsilateral XRT in April 2019 for presumed lung cancer.  S/p chest tube placement 12/22. Plan - CXR this am with recurrence of small left PTX . Place CT back to sxn.  ?skin fold on right , repeat pcxr. Per recs  -repeat CXR in am  Acute on chronic respiratory failure COPD, pneumonia Multifocal PNA appreciated on CXR 09/05/18-full course of abx completed.  Interval improvement per CXRs  Plan - continue anoro, budesonide, -Decrease prednisone 40mg  . . -would place on prednisone taper at discharge.  - bronchial hygiene. - Mobilize as able  Rest per primary team.  Rexene Edison NP-C  [facility]  [phone]   09/15/2018, 8:05 AM

## 2018-09-15 NOTE — Progress Notes (Signed)
PROGRESS NOTE  [name] KZS:[id number] DOB: 16-Mar-1932 DOA: 09/05/2018 PCP: Harlan Stains, MD   LOS: 10 days   Brief Narrative / Interim history: 84 year old female with reduced DLCO, COPD, diabetes mellitus type 2, malnutrition, stage I lung cancer presumed stage I left lower lobe nodule with empiric radiation therapy finished in April 2019 was admitted with shortness of breath, diagnosed with COPD exacerbation with multifocal pneumonia and DKA.  Because of cough she developed spontaneous pneumothorax on 12/22, pulmonology was consulted and she had a chest tube placed.  Subjective: -a bit frustrated that her chest tube is back to suctioning but overall OK, no chest pain, no shortness of breath. Denies fevers and chills  Assessment & Plan: Principal Problem:   Acute on chronic respiratory failure with hypoxia (HCC) Active Problems:   Essential hypertension   S/P CABG x 1   Recurrent pneumonia   Pulmonary HTN (HCC)   COPD with acute exacerbation (HCC)   Pneumonia   Protein-calorie malnutrition, severe   Pneumothorax on left   Principal Problem Acute on chronic respiratory failure with hypoxia secondary to multifocal pneumonia, COPD exacerbation, acute pneumothorax in the setting of severe reduction in DLCO, stage I lung CA -Patient actually presented with shortness of breath, coughing, initial chest x-ray showed bilateral lower lobe pneumonia -Patient was started on IV antibiotics, Zithromax and IV Rocephin and she completed a course while hospitalized with antibiotics discontinued on 12/26 -Urine strep and antigen negative, blood cultures negative.  She was also started on IV Solu-Medrol, subsequently transitioned to oral prednisone -Patient developed spontaneous left pneumothorax on 12/22. Pulmonology was consulted, underwent chest tube placement. -Chest tube managed by pulmonary, back to suction again this morning given slight increase in pneumothorax on CXR  Additional  Problems Type 2 diabetes mellitus, uncontrolled with hyperglycemia, DKA -CBGs worsened secondary to steroids, subsequently causing high anion gap metabolic acidosis.  Lactic acid was normal.  Patient has a history of type 2 diabetes mellitus and was recently taken off of for metformin. -Patient was placed on DKA protocol with IV fluids, IV insulin, now transitioned to subcu insulin -CBGs fairly stable today, continue current regimen -Patient will need glipizide or Amaryl at discharge for short-term to cover for steroid-induced hyperglycemia along with metformin  Chronic diastolic CHF -Euvolemic, Initially Lasix and Avapro were held due to acute kidney injury  Accelerated hypertension -Continue amlodipine, clonidine, Avapro, placed on hydralazine, blood pressure stable  Dyslipidemia -Continue Pravachol  Severe protein calorie malnutrition -Dietitian consulted, continue nutritional supplement  Hyperkalemia -On 12/26, status post Kayexalate, this morning 3.3, continue to monitor  Scheduled Meds: . amLODipine  10 mg Oral Daily  . aspirin EC  81 mg Oral QHS  . benzonatate  100 mg Oral TID  . budesonide (PULMICORT) nebulizer solution  0.5 mg Nebulization BID  . buPROPion  150 mg Oral Daily  . cholecalciferol  1,000 Units Oral QHS  . cloNIDine  0.1 mg Oral Q8H  . dextromethorphan-guaiFENesin  1 tablet Oral BID  . enoxaparin (LOVENOX) injection  30 mg Subcutaneous Q24H  . feeding supplement (GLUCERNA SHAKE)  237 mL Oral TID BM  . ferrous sulfate  325 mg Oral BID  . hydrALAZINE  10 mg Oral Q8H  . insulin aspart  0-5 Units Subcutaneous QHS  . insulin aspart  0-9 Units Subcutaneous TID WC  . insulin glargine  10 Units Subcutaneous Daily  . pravastatin  20 mg Oral QHS  . [START ON 09/16/2018] predniSONE  40 mg Oral Q breakfast  .  sertraline  100 mg Oral q morning - 10a  . umeclidinium-vilanterol  1 puff Inhalation Daily   Continuous Infusions: PRN Meds:.acetaminophen, albuterol,  antiseptic oral rinse, guaiFENesin, hydrALAZINE, menthol-cetylpyridinium, oxyCODONE-acetaminophen **AND** oxyCODONE, traZODone  DVT prophylaxis: Lovenox Code Status: Full code Family Communication: No family present at bedside Disposition Plan: Home when cleared by pulmonology  Consultants:   Pulmonary  Procedures:   Chest tube placement on 12/22  Antimicrobials:  Ceftriaxone/azithromycin 12/19 >>/26  Objective: Vitals:   09/15/18 0618 09/15/18 0732 09/15/18 0737 09/15/18 1145  BP: (!) 180/77  (!) 163/51 (!) 179/95  Pulse:   (!) 48   Resp:   14 (!) 22  Temp:   (!) 97.5 F (36.4 C) 98.3 F (36.8 C)  TempSrc:   Oral Oral  SpO2:  99% 100%   Weight:      Height:        Intake/Output Summary (Last 24 hours) at 09/15/2018 1155 Last data filed at 09/15/2018 0830 Gross per 24 hour  Intake -  Output 0 ml  Net 0 ml   Filed Weights   09/05/18 1424  Weight: 45.4 kg    Examination:  Constitutional: NAD Respiratory: CTA, no wheezing  Cardiovascular: RRR  Data Reviewed: I have independently reviewed following labs and imaging studies   CBC: Recent Labs  Lab 09/09/18 0216 09/10/18 0234 09/11/18 0246 09/15/18 0146  WBC 12.9* 11.0* 13.7* 14.3*  HGB 10.9* 10.5* 10.6* 10.8*  HCT 34.2* 33.0* 33.3* 35.2*  MCV 91.2 91.9 91.0 91.0  PLT 256 253 291 132   Basic Metabolic Panel: Recent Labs  Lab 09/11/18 0246 09/12/18 0554 09/13/18 0247 09/13/18 0628 09/14/18 0227 09/15/18 0146  NA  --  139 137  --  140 138  K  --  3.9 5.9* 4.0 3.3* 3.8  CL  --  103 105  --  103 102  CO2  --  26 22  --  29 25  GLUCOSE  --  97 145*  --  79 170*  BUN  --  33* 33*  --  24* 31*  CREATININE  --  1.22* 1.22*  --  1.14* 1.41*  CALCIUM  --  8.8* 8.8*  --  8.6* 8.9  MG 1.9 2.0 2.1  --  1.9  --   PHOS 4.2 3.5 3.1  --  3.2  --    GFR: Estimated Creatinine Clearance: 21.3 mL/min (A) (by C-G formula based on SCr of 1.41 mg/dL (H)). Liver Function Tests: No results for input(s): AST,  ALT, ALKPHOS, BILITOT, PROT, ALBUMIN in the last 168 hours. No results for input(s): LIPASE, AMYLASE in the last 168 hours. No results for input(s): AMMONIA in the last 168 hours. Coagulation Profile: No results for input(s): INR, PROTIME in the last 168 hours. Cardiac Enzymes: No results for input(s): CKTOTAL, CKMB, CKMBINDEX, TROPONINI in the last 168 hours. BNP (last 3 results) No results for input(s): PROBNP in the last 8760 hours. HbA1C: No results for input(s): HGBA1C in the last 72 hours. CBG: Recent Labs  Lab 09/14/18 1206 09/14/18 1624 09/14/18 2146 09/15/18 0739 09/15/18 1148  GLUCAP 266* 282* 303* 127* 151*   Lipid Profile: No results for input(s): CHOL, HDL, LDLCALC, TRIG, CHOLHDL, LDLDIRECT in the last 72 hours. Thyroid Function Tests: No results for input(s): TSH, T4TOTAL, FREET4, T3FREE, THYROIDAB in the last 72 hours. Anemia Panel: No results for input(s): VITAMINB12, FOLATE, FERRITIN, TIBC, IRON, RETICCTPCT in the last 72 hours. Urine analysis:    Component Value Date/Time  COLORURINE YELLOW 03/02/2017 2156   APPEARANCEUR HAZY (A) 03/02/2017 2156   LABSPEC 1.014 03/02/2017 2156   PHURINE 5.0 03/02/2017 2156   GLUCOSEU NEGATIVE 03/02/2017 2156   HGBUR NEGATIVE 03/02/2017 2156   BILIRUBINUR NEGATIVE 03/02/2017 2156   [place] 03/02/2017 2156   PROTEINUR 30 (A) 03/02/2017 2156   UROBILINOGEN 0.2 07/11/2013 1416   NITRITE NEGATIVE 03/02/2017 2156   LEUKOCYTESUR SMALL (A) 03/02/2017 2156   Sepsis Labs: Invalid input(s): PROCALCITONIN, LACTICIDVEN  Recent Results (from the past 240 hour(s))  Culture, blood (routine x 2) Call MD if unable to obtain prior to antibiotics being given     Status: None   Collection Time: 09/05/18  9:00 PM  Result Value Ref Range Status   Specimen Description BLOOD RIGHT HAND  Final   Special Requests   Final    BOTTLES DRAWN AEROBIC ONLY Blood Culture adequate volume   Culture   Final    NO GROWTH 5  DAYS Performed at [facility], [address] [place], [place] [address]    Report Status 09/10/2018 FINAL  Final  Culture, blood (routine x 2) Call MD if unable to obtain prior to antibiotics being given     Status: None   Collection Time: 09/05/18  9:14 PM  Result Value Ref Range Status   Specimen Description BLOOD LEFT HAND  Final   Special Requests   Final    BOTTLES DRAWN AEROBIC ONLY Blood Culture adequate volume   Culture   Final    NO GROWTH 5 DAYS Performed at [facility], [place] [address], [place], [place] [address]    Report Status 09/10/2018 FINAL  Final      Radiology Studies: Dg Chest Port 1 View  Result Date: 09/15/2018 CLINICAL DATA:  84 year old female with history of left-sided pneumothorax. No current chest pain. EXAM: PORTABLE CHEST 1 VIEW COMPARISON:  Chest x-ray 09/14/2018. FINDINGS: Left-sided chest tube remains in position with pigtail projecting over the lateral aspect of the left hemithorax. Small residual left-sided pneumothorax noted. Small amount of left-sided pleural effusion. Small right pleural effusion. Bibasilar opacities which may reflect areas of atelectasis and/or airspace consolidation. Large hiatal hernia. No evidence of pulmonary edema. Probable skin fold artifact projecting over the right hemithorax. Heart size is normal. Upper mediastinal contours are within normal limits. Aortic atherosclerosis. Status post median sternotomy for aortic valve replacement with a bioprosthetic aortic valve. IMPRESSION: 1. Left-sided chest tube appears stable in position. There is an apparent recurrence of a small left pneumothorax. In addition, there is what appears to be a skin fold artifact projecting over the right hemithorax. The possibility of left-sided skinfold artifact is not excluded, but not favored. Repeat chest radiograph (with efforts to mitigate potential skinfold artifact) is recommended to better evaluate these findings. 2. Persistent areas of  atelectasis and/or consolidation in the lung bases bilaterally with small bilateral pleural effusions. 3. Large hiatal hernia. Electronically Signed   By: Vinnie Langton M.D.   On: 09/15/2018 08:54   Dg Chest Port 1 View  Result Date: 09/14/2018 CLINICAL DATA:  Pneumothorax, chest tube adjustment. EXAM: PORTABLE CHEST 1 VIEW COMPARISON:  September 14, 2018 12:39 p.m. FINDINGS: The heart size and mediastinal contours are stable. Left chest tube is unchanged. There is small left pleural effusion. There is no pleural line in the left hemithorax to suggest pneumothorax. Minimal right pleural effusion is noted. The visualized skeletal structures are stable. IMPRESSION: Left chest tube is identified in the lateral left  mid to lower hemithorax unchanged compared prior exam. Small left pleural effusion. No definite pleural line is noted to suggest pneumothorax. Electronically Signed   By: Abelardo Diesel M.D.   On: 09/14/2018 22:33   Dg Chest Port 1 View  Result Date: 09/14/2018 CLINICAL DATA:  Left-sided pneumothorax EXAM: PORTABLE CHEST 1 VIEW COMPARISON:  09/06/2018 at 0517 hours FINDINGS: Left-sided chest tube remains in place along the periphery of the left mid lung. No recurrence or visible left-sided pneumothorax is identified. There is bibasilar atelectasis with minimal blunting of the left lateral costophrenic angle presumably a small left effusion unchanged in appearance. No mediastinal shift. Median sternotomy sutures are in place with evidence of prior aortic valvular replacement and post CABG change. Heart size is normal with probable small hiatal hernia superimposed. IMPRESSION: Stable left-sided chest tube without recurrence or visible pneumothorax. Electronically Signed   By: Ashley Royalty M.D.   On: 09/14/2018 14:32   Dg Chest Port 1 View  Result Date: 09/14/2018 CLINICAL DATA:  Follow-up pneumothorax EXAM: PORTABLE CHEST 1 VIEW COMPARISON:  Second film from yesterday FINDINGS: Left apical  pneumothorax on prior is no longer seen. Skin fold seen at the lateral bases on both sides. Small left pleural effusion. Atelectasis at both bases. Normal heart size. Prior aortic valve replacement. IMPRESSION: Apical pneumothorax on prior is no longer seen. Otherwise stable. Electronically Signed   By: Monte Fantasia M.D.   On: 09/14/2018 07:46   Dg Chest Port 1 View  Result Date: 09/13/2018 CLINICAL DATA:  Follow-up LEFT pneumothorax. Small caliber LEFT chest tube in place but recently clamped. Current history of LEFT LOWER LOBE lung cancer treated with radiation therapy. EXAM: PORTABLE CHEST 1 VIEW 1:08 p.m.: COMPARISON:  Chest x-ray earlier same day 7:22 a.m., yesterday and previously. FINDINGS: The RIGHT apical pneumothorax has increased slightly since the examination earlier today and yesterday, still on the order of 5% or so. Small caliber LEFT chest tube remains in place. Bullous emphysematous changes in the UPPER lobes, LEFT greater than RIGHT as noted previously. Pleuroparenchymal scarring in the bases, LEFT greater than RIGHT. No new pulmonary parenchymal abnormalities. Prior sternotomy for aortic valve replacement and CABG. Cardiac silhouette upper normal in size, unchanged. Thoracic aorta atherosclerotic, unchanged. Prominent central pulmonary arteries, unchanged. IMPRESSION: 1. Slight interval increase in the RIGHT apical pneumothorax since the examination earlier today, still on the order of 5% or so, with small caliber LEFT chest tube in place. 2. Stable pleuroparenchymal scarring in the bases, LEFT greater than RIGHT. 3.  Emphysema. (ICD10-J43.9) 4. No new abnormalities. Electronically Signed   By: Evangeline Dakin M.D.   On: 09/13/2018 13:38    Marzetta Board, MD, PhD [facility] Pager [phone]  If 7PM-7AM, please contact night-coverage [email or web address] Password [facility] 09/15/2018, 11:55 AM

## 2018-09-16 DIAGNOSIS — J181 Lobar pneumonia, unspecified organism: Secondary | ICD-10-CM

## 2018-09-16 DIAGNOSIS — J9601 Acute respiratory failure with hypoxia: Secondary | ICD-10-CM

## 2018-09-16 LAB — CBC
HEMATOCRIT: 32.7 % — AB (ref 36.0–46.0)
HEMOGLOBIN: 10.2 g/dL — AB (ref 12.0–15.0)
MCH: 28.4 pg (ref 26.0–34.0)
MCHC: 31.2 g/dL (ref 30.0–36.0)
MCV: 91.1 fL (ref 80.0–100.0)
Platelets: 288 10*3/uL (ref 150–400)
RBC: 3.59 MIL/uL — ABNORMAL LOW (ref 3.87–5.11)
RDW: 13.9 % (ref 11.5–15.5)
WBC: 13.5 10*3/uL — ABNORMAL HIGH (ref 4.0–10.5)
nRBC: 0 % (ref 0.0–0.2)

## 2018-09-16 LAB — BASIC METABOLIC PANEL
Anion gap: 8 (ref 5–15)
BUN: 32 mg/dL — ABNORMAL HIGH (ref 8–23)
CO2: 25 mmol/L (ref 22–32)
Calcium: 9.3 mg/dL (ref 8.9–10.3)
Chloride: 105 mmol/L (ref 98–111)
Creatinine, Ser: 1.04 mg/dL — ABNORMAL HIGH (ref 0.44–1.00)
GFR calc Af Amer: 57 mL/min — ABNORMAL LOW (ref >60)
GFR calc non Af Amer: 49 mL/min — ABNORMAL LOW (ref >60)
Glucose, Bld: 170 mg/dL — ABNORMAL HIGH (ref 70–99)
Potassium: 3.6 mmol/L (ref 3.5–5.1)
Sodium: 138 mmol/L (ref 135–145)

## 2018-09-16 LAB — GLUCOSE, CAPILLARY
GLUCOSE-CAPILLARY: 233 mg/dL — AB (ref 70–99)
Glucose-Capillary: 103 mg/dL — ABNORMAL HIGH (ref 70–99)
Glucose-Capillary: 165 mg/dL — ABNORMAL HIGH (ref 70–99)
Glucose-Capillary: 234 mg/dL — ABNORMAL HIGH (ref 70–99)

## 2018-09-16 NOTE — Progress Notes (Signed)
PROGRESS NOTE  [name] FBP:[id number] DOB: [birth year] DOA: 09/05/2018 PCP: Harlan Stains, MD   LOS: 11 days   Brief Narrative / Interim history: 84 year old female with reduced DLCO, COPD, diabetes mellitus type 2, malnutrition, stage I lung cancer presumed stage I left lower lobe nodule with empiric radiation therapy finished in April 2019 was admitted with shortness of breath, diagnosed with COPD exacerbation with multifocal pneumonia and DKA.  Because of cough she developed spontaneous pneumothorax on 12/22, pulmonology was consulted and she had a chest tube placed.  Subjective: -Doing well this morning, denies any shortness of breath, denies any chest pain.  Able to ambulate in the room without significant issues.  Awaiting cardiothoracic surgery input  Assessment & Plan: Principal Problem:   Acute on chronic respiratory failure with hypoxia (HCC) Active Problems:   Essential hypertension   S/P CABG x 1   Recurrent pneumonia   Pulmonary HTN (HCC)   COPD with acute exacerbation (HCC)   Pneumonia   Protein-calorie malnutrition, severe   Pneumothorax on left   Acute pneumothorax   Principal Problem Acute on chronic respiratory failure with hypoxia secondary to multifocal pneumonia, COPD exacerbation, acute pneumothorax in the setting of severe reduction in DLCO, stage I lung CA -Patient actually presented with shortness of breath, coughing, initial chest x-ray showed bilateral lower lobe pneumonia -Patient was started on IV antibiotics, Zithromax and IV Rocephin and she completed a course while hospitalized with antibiotics discontinued on 12/26 -Urine strep and antigen negative, blood cultures negative.  She was also started on IV Solu-Medrol, subsequently transitioned to oral prednisone -Patient developed spontaneous left pneumothorax on 12/22. Pulmonology was consulted, underwent chest tube placement. -Chest tube management by pulmonary, she appears to have recurrent  pneumothorax when clamping.  Pulmonology discussed with cardiothoracic surgery, will consult  Additional Problems Type 2 diabetes mellitus, uncontrolled with hyperglycemia, DKA -CBGs worsened secondary to steroids, subsequently causing high anion gap metabolic acidosis.  Lactic acid was normal.  Patient has a history of type 2 diabetes mellitus and was recently taken off of for metformin. -Patient was placed on DKA protocol with IV fluids, IV insulin, now transitioned to subcu insulin -CBGs remained stable, keep on current regimen  Chronic diastolic CHF -Euvolemic, Initially Lasix and Avapro were held due to acute kidney injury  Accelerated hypertension -Continue amlodipine, clonidine, Avapro, placed on hydralazine, blood pressure stable  Dyslipidemia -Continue Pravachol  Severe protein calorie malnutrition -Dietitian consulted, continue nutritional supplement  Hyperkalemia -Resolved  Scheduled Meds: . amLODipine  10 mg Oral Daily  . aspirin EC  81 mg Oral QHS  . benzonatate  100 mg Oral TID  . budesonide (PULMICORT) nebulizer solution  0.5 mg Nebulization BID  . buPROPion  150 mg Oral Daily  . cholecalciferol  1,000 Units Oral QHS  . cloNIDine  0.1 mg Oral Q8H  . dextromethorphan-guaiFENesin  1 tablet Oral BID  . enoxaparin (LOVENOX) injection  30 mg Subcutaneous Q24H  . feeding supplement (GLUCERNA SHAKE)  237 mL Oral TID BM  . ferrous sulfate  325 mg Oral BID  . hydrALAZINE  10 mg Oral Q8H  . insulin aspart  0-5 Units Subcutaneous QHS  . insulin aspart  0-9 Units Subcutaneous TID WC  . insulin glargine  10 Units Subcutaneous Daily  . pravastatin  20 mg Oral QHS  . predniSONE  40 mg Oral Q breakfast  . sertraline  100 mg Oral q morning - 10a  . umeclidinium-vilanterol  1 puff Inhalation Daily   Continuous  Infusions: PRN Meds:.acetaminophen, albuterol, antiseptic oral rinse, guaiFENesin, hydrALAZINE, menthol-cetylpyridinium, oxyCODONE-acetaminophen **AND**  oxyCODONE, traZODone  DVT prophylaxis: Lovenox Code Status: Full code Family Communication: No family present at bedside Disposition Plan: Home when cleared by pulmonology  Consultants:   Pulmonary  Procedures:   Chest tube placement on 12/22  Antimicrobials:  Ceftriaxone/azithromycin 12/19 >>/26  Objective: Vitals:   09/15/18 2349 09/16/18 0354 09/16/18 0616 09/16/18 0733  BP: (!) 143/61 (!) 159/58 (!) 187/69 (!) 160/55  Pulse: (!) 52 (!) 48  (!) 58  Resp: 18 15  17   Temp: 97.9 F (36.6 C) (!) 97.4 F (36.3 C)  97.6 F (36.4 C)  TempSrc: Oral Oral  Oral  SpO2: 98% 97%  97%  Weight:      Height:        Intake/Output Summary (Last 24 hours) at 09/16/2018 1049 Last data filed at 09/15/2018 1946 Gross per 24 hour  Intake 240 ml  Output 101 ml  Net 139 ml   Filed Weights   09/05/18 1424  Weight: 45.4 kg    Examination:  Constitutional: NAD Respiratory: Clear to auscultation without wheezing Cardiovascular: Regular rate and rhythm, no new murmurs  Data Reviewed: I have independently reviewed following labs and imaging studies   CBC: Recent Labs  Lab 09/10/18 0234 09/11/18 0246 09/15/18 0146 09/16/18 0241  WBC 11.0* 13.7* 14.3* 13.5*  HGB 10.5* 10.6* 10.8* 10.2*  HCT 33.0* 33.3* 35.2* 32.7*  MCV 91.9 91.0 91.0 91.1  PLT 253 291 318 038   Basic Metabolic Panel: Recent Labs  Lab 09/11/18 0246  09/12/18 0554 09/13/18 0247 09/13/18 0628 09/14/18 0227 09/15/18 0146 09/16/18 0241  NA  --   --  139 137  --  140 138 138  K  --    < > 3.9 5.9* 4.0 3.3* 3.8 3.6  CL  --   --  103 105  --  103 102 105  CO2  --   --  26 22  --  29 25 25   GLUCOSE  --   --  97 145*  --  79 170* 170*  BUN  --   --  33* 33*  --  24* 31* 32*  CREATININE  --   --  1.22* 1.22*  --  1.14* 1.41* 1.04*  CALCIUM  --   --  8.8* 8.8*  --  8.6* 8.9 9.3  MG 1.9  --  2.0 2.1  --  1.9  --   --   PHOS 4.2  --  3.5 3.1  --  3.2  --   --    < > = values in this interval not displayed.    GFR: Estimated Creatinine Clearance: 28.9 mL/min (A) (by C-G formula based on SCr of 1.04 mg/dL (H)). Liver Function Tests: No results for input(s): AST, ALT, ALKPHOS, BILITOT, PROT, ALBUMIN in the last 168 hours. No results for input(s): LIPASE, AMYLASE in the last 168 hours. No results for input(s): AMMONIA in the last 168 hours. Coagulation Profile: No results for input(s): INR, PROTIME in the last 168 hours. Cardiac Enzymes: No results for input(s): CKTOTAL, CKMB, CKMBINDEX, TROPONINI in the last 168 hours. BNP (last 3 results) No results for input(s): PROBNP in the last 8760 hours. HbA1C: No results for input(s): HGBA1C in the last 72 hours. CBG: Recent Labs  Lab 09/15/18 0739 09/15/18 1148 09/15/18 1654 09/15/18 2124 09/16/18 0735  GLUCAP 127* 151* 302* 317* 103*   Lipid Profile: No results for input(s): CHOL, HDL,  LDLCALC, TRIG, CHOLHDL, LDLDIRECT in the last 72 hours. Thyroid Function Tests: No results for input(s): TSH, T4TOTAL, FREET4, T3FREE, THYROIDAB in the last 72 hours. Anemia Panel: No results for input(s): VITAMINB12, FOLATE, FERRITIN, TIBC, IRON, RETICCTPCT in the last 72 hours. Urine analysis:    Component Value Date/Time   COLORURINE YELLOW 03/02/2017 2156   APPEARANCEUR HAZY (A) 03/02/2017 2156   LABSPEC 1.014 03/02/2017 2156   PHURINE 5.0 03/02/2017 2156   GLUCOSEU NEGATIVE 03/02/2017 2156   HGBUR NEGATIVE 03/02/2017 2156   BILIRUBINUR NEGATIVE 03/02/2017 2156   Vass 03/02/2017 2156   PROTEINUR 30 (A) 03/02/2017 2156   UROBILINOGEN 0.2 07/11/2013 1416   NITRITE NEGATIVE 03/02/2017 2156   LEUKOCYTESUR SMALL (A) 03/02/2017 2156   Sepsis Labs: Invalid input(s): PROCALCITONIN, LACTICIDVEN  No results found for this or any previous visit (from the past 240 hour(s)).    Radiology Studies: Dg Chest Port 1 View  Result Date: 09/15/2018 CLINICAL DATA:  History of pneumothorax. EXAM: PORTABLE CHEST 1 VIEW COMPARISON:  Chest  radiograph 09/15/2018 FINDINGS: Monitoring leads overlie the patient. Stable cardiomegaly. Aortic atherosclerosis. Similar-appearing mid and lower lung heterogeneous opacities. Small bilateral pleural effusions. Left chest tube stable in position. No definite left-sided pneumothorax identified on current exam. No right-sided pneumothorax identified. Thoracic spine degenerative changes. IMPRESSION: Left chest tube remains in position. No definite left-sided pneumothorax identified. No definite right-sided pneumothorax identified. Similar-appearing bibasilar consolidation and small effusions. Electronically Signed   By: Lovey Newcomer M.D.   On: 09/15/2018 12:50   Dg Chest Port 1 View  Result Date: 09/15/2018 CLINICAL DATA:  84 year old female with history of left-sided pneumothorax. No current chest pain. EXAM: PORTABLE CHEST 1 VIEW COMPARISON:  Chest x-ray 09/14/2018. FINDINGS: Left-sided chest tube remains in position with pigtail projecting over the lateral aspect of the left hemithorax. Small residual left-sided pneumothorax noted. Small amount of left-sided pleural effusion. Small right pleural effusion. Bibasilar opacities which may reflect areas of atelectasis and/or airspace consolidation. Large hiatal hernia. No evidence of pulmonary edema. Probable skin fold artifact projecting over the right hemithorax. Heart size is normal. Upper mediastinal contours are within normal limits. Aortic atherosclerosis. Status post median sternotomy for aortic valve replacement with a bioprosthetic aortic valve. IMPRESSION: 1. Left-sided chest tube appears stable in position. There is an apparent recurrence of a small left pneumothorax. In addition, there is what appears to be a skin fold artifact projecting over the right hemithorax. The possibility of left-sided skinfold artifact is not excluded, but not favored. Repeat chest radiograph (with efforts to mitigate potential skinfold artifact) is recommended to better  evaluate these findings. 2. Persistent areas of atelectasis and/or consolidation in the lung bases bilaterally with small bilateral pleural effusions. 3. Large hiatal hernia. Electronically Signed   By: Vinnie Langton M.D.   On: 09/15/2018 08:54   Dg Chest Port 1 View  Result Date: 09/14/2018 CLINICAL DATA:  Pneumothorax, chest tube adjustment. EXAM: PORTABLE CHEST 1 VIEW COMPARISON:  September 14, 2018 12:39 p.m. FINDINGS: The heart size and mediastinal contours are stable. Left chest tube is unchanged. There is small left pleural effusion. There is no pleural line in the left hemithorax to suggest pneumothorax. Minimal right pleural effusion is noted. The visualized skeletal structures are stable. IMPRESSION: Left chest tube is identified in the lateral left mid to lower hemithorax unchanged compared prior exam. Small left pleural effusion. No definite pleural line is noted to suggest pneumothorax. Electronically Signed   By: Abelardo Diesel M.D.   On:  09/14/2018 22:33   Dg Chest Port 1 View  Result Date: 09/14/2018 CLINICAL DATA:  Left-sided pneumothorax EXAM: PORTABLE CHEST 1 VIEW COMPARISON:  09/06/2018 at 0517 hours FINDINGS: Left-sided chest tube remains in place along the periphery of the left mid lung. No recurrence or visible left-sided pneumothorax is identified. There is bibasilar atelectasis with minimal blunting of the left lateral costophrenic angle presumably a small left effusion unchanged in appearance. No mediastinal shift. Median sternotomy sutures are in place with evidence of prior aortic valvular replacement and post CABG change. Heart size is normal with probable small hiatal hernia superimposed. IMPRESSION: Stable left-sided chest tube without recurrence or visible pneumothorax. Electronically Signed   By: Ashley Royalty M.D.   On: 09/14/2018 14:32    Marzetta Board, MD, PhD [facility] Pager [phone]  If 7PM-7AM, please contact night-coverage [email or web address] Password  [email or web address] 09/16/2018, 10:49 AM

## 2018-09-16 NOTE — Progress Notes (Signed)
NAME:  [name], MRN:  [id number], DOB:  1932-06-11, LOS: 42 ADMISSION DATE:  09/05/2018, CONSULTATION DATE:  09/08/2018 REFERRING MD:  Marlowe Sax MD, CHIEF COMPLAINT:  Lt pneumothorax   Brief History   84 year old with COPD admitted for acute respiratory failure, multifocal pneumonia, DKA Developed spontaneous pneumothorax on 12/22.  PCCM called for management  Past Medical History  COPD gold stage 2 but severe reducton in dlco (fev may 2019 - 0.8L/60% and dlco 29%), type 2 diabetes, diabetes mellitus, coronary artery disease, GERD, Stage I lung cancer [presumed stage I LLL nodule - empiric XRT ending April 2019).  She follows with Dr. Chase Caller.  History noted for PET positive, left lower lobe spiculated nodule Discussed at multidisciplinary tumor board and biopsy deferred, Underwent SBRT in April 2019  [place] [facility] Events   12/19 > admit 12/22 Lt spontaneous pneumothorax s/p chest tube placement 12/23 Chest tube placed 12/22 remains in place, to suction, without air leak. Patient reports better pain management with changes made to pain regimen by primary team 12/26 chest tube changed to water seal 12/27 chest tube clamped 12/29 recurrent PTX after chest tube clamped, placed back to suction  Consults:  PCCM 12/22 >  Procedures:  Lt chest tube 12/22 >  Significant Diagnostic Tests:  CT chest 08/28/2018- advanced emphysema, left lower lobe lung nodule with bandlike interstitial airspace opacity suggestive of radiation fibrosis. Chest x-ray 12/22 > moderate-sized left pneumothorax CXR 12/23- interval improvement in size of L-sided PTX, however small sized L PTX still present. Pigtail placement unchanged.  CXR 12/26 > no PTX  Micro Data:  Blood culture 12/19- NGTD  Antimicrobials:  Ceftriaxone for 7 days 12/19>> Azithromycin for 5 days 12/19>>  Interim history/subjective:  Recurrent PTX after chest tube clamped 12/29.  Placed back on suction.  CXR this AM with no  recurrence.  This has been pattern x 2 now.  Objective   Blood pressure (!) 125/49, pulse (!) 54, temperature 97.6 F (36.4 C), temperature source Oral, resp. rate 19, height 5' (1.524 m), weight 45.4 kg, SpO2 98 %.        Intake/Output Summary (Last 24 hours) at 09/16/2018 1414 Last data filed at 09/16/2018 1000 Gross per 24 hour  Intake 540 ml  Output 110 ml  Net 430 ml   Filed Weights   09/05/18 1424  Weight: 45.4 kg     General -elderly female, resting in bed in NAD  ENT -A T, Huey, no stridor no LAD  Cardiac -S1, S2, RRR, and no MRG  Chest -decreased breath sounds in the bases.  No wheezing noted.  Left chest tube clamped  Abdomen -soft nontender, positive bowel sounds  Extremities -moves all extremities well, no edema Skin -no rashes skin intact Neuro - A&O x 3, no deficits  Psych -pleasant  Resolved Hospital Problem list   DKA  Assessment & Plan:  84 year old with advanced emphysema, lung cancer =- presumed LLL and empiric XRT April 2019 AE COPD, PNA, spontaneous left pneumothorax  Left spontaneous pneumothorax. Likely triggered by cough in setting of COPD with emphysema and severe DLCO reduction.  Has ipsilateral XRT in April 2019 for presumed lung cancer.  S/p chest tube placement 12/22.  Has now had pattern or lung re-expansion with suction; however, whenever chest tube is clamped, PTX returns. Plan - Maintain chest tube on suction for now. - Will likely need talc pleurodesis, will discuss with Dr. Lake Bells.  Note that pt is reluctant to surgical intervention.  Acute on  chronic respiratory failure COPD, pneumonia Multifocal PNA appreciated on CXR 09/05/18-full course of abx completed.  Interval improvement per CXRs Plan - Continue anoro, budesonide. - Continue prednisone 40mg  - would place on prednisone taper at discharge.  - Bronchial hygiene. - Mobilize as able.  Rest per primary team.   Montey Hora, PA - [facility]  [facility] Pager: [phone]  or [phone] 09/16/2018, 2:17 PM

## 2018-09-17 ENCOUNTER — Inpatient Hospital Stay (HOSPITAL_COMMUNITY): Payer: Medicare Other

## 2018-09-17 DIAGNOSIS — J441 Chronic obstructive pulmonary disease with (acute) exacerbation: Secondary | ICD-10-CM

## 2018-09-17 DIAGNOSIS — I10 Essential (primary) hypertension: Secondary | ICD-10-CM

## 2018-09-17 LAB — GLUCOSE, CAPILLARY
Glucose-Capillary: 112 mg/dL — ABNORMAL HIGH (ref 70–99)
Glucose-Capillary: 195 mg/dL — ABNORMAL HIGH (ref 70–99)
Glucose-Capillary: 264 mg/dL — ABNORMAL HIGH (ref 70–99)
Glucose-Capillary: 221 mg/dL — ABNORMAL HIGH (ref 70–99)

## 2018-09-17 MED ORDER — MORPHINE SULFATE (PF) 2 MG/ML IV SOLN
1.0000 mg | Freq: Once | INTRAVENOUS | Status: AC
  Administered 2018-09-17: 1 mg via INTRAVENOUS
  Filled 2018-09-17: qty 1

## 2018-09-17 MED ORDER — TALC (STERITALC) POWDER FOR INTRAPLEURAL USE
4.0000 g | Freq: Once | INTRAVENOUS | Status: AC
  Administered 2018-09-18: 4 g via INTRAPLEURAL
  Filled 2018-09-17: qty 4

## 2018-09-17 MED ORDER — ALBUTEROL SULFATE (2.5 MG/3ML) 0.083% IN NEBU
2.5000 mg | INHALATION_SOLUTION | Freq: Three times a day (TID) | RESPIRATORY_TRACT | Status: DC
  Administered 2018-09-18 – 2018-09-20 (×8): 2.5 mg via RESPIRATORY_TRACT
  Filled 2018-09-17 (×7): qty 3

## 2018-09-17 MED ORDER — LIDOCAINE HCL 1 % IJ SOLN
10.0000 mL | Freq: Once | INTRAMUSCULAR | Status: DC
  Filled 2018-09-17: qty 10

## 2018-09-17 MED ORDER — PREDNISONE 20 MG PO TABS
30.0000 mg | ORAL_TABLET | Freq: Every day | ORAL | Status: DC
  Administered 2018-09-18 – 2018-09-19 (×2): 30 mg via ORAL
  Filled 2018-09-17 (×2): qty 1

## 2018-09-17 NOTE — Progress Notes (Signed)
Dr. Lynetta Mare paged at [phone]. Patient is status post chest tube replacement. Patient now with congested cough. A small amount of bloody sputum noted with cough. Situation discussed with Dr. Lynetta Mare repeat chest xray reviewed. No new orders at this time. Will continue to monitor and notify MD is situation worsens.

## 2018-09-17 NOTE — Progress Notes (Signed)
Physical Therapy Treatment Patient Details Name: [name] MRN: [id number] DOB: July 23, 1932 Today's Date: 09/17/2018    History of Present Illness Patient is a 84 y/o female who presents from PCP office with concern for PNA- reports SOB, cough for a few days. CXR- multifocal PNA. Found to have acute on chronic respiratory failure secondary to COPD and PNA. Also DKA.  L chest tube inserted 09/08/18.  PMH includes COPD, depression, HTN, DM, CAD s/p CABG.    PT Comments    Pt was seen for mobility after clearance from nursing given that she has been having pain from her back with recent fall to get her phone.  Pt is walking well with her RW, chest tube in place and off suction for now.  Will follow acutely to progress her to more independent mobility, and noted O2 sats were 90% or greater with gait today.  Was on O2 during all mobility and reconnected to wall air for now.   Follow Up Recommendations  Supervision for mobility/OOB;Outpatient PT     Equipment Recommendations  None recommended by PT    Recommendations for Other Services       Precautions / Restrictions Precautions Precautions: Fall;Other (comment)(monitor O2 sats) Precaution Comments: monitor O2 sats with mobility Required Braces or Orthoses: Other Brace Other Brace: L side chest tube Restrictions Weight Bearing Restrictions: No    Mobility  Bed Mobility Overal bed mobility: Needs Assistance Bed Mobility: Supine to Sit;Sit to Supine     Supine to sit: Min guard Sit to supine: Min guard   General bed mobility comments: due to her fall  Transfers Overall transfer level: Needs assistance Equipment used: Rolling walker (2 wheeled);1 person hand held assist Transfers: Sit to/from Stand Sit to Stand: Min guard         General transfer comment: Min guard assist for safety  Ambulation/Gait Ambulation/Gait assistance: Min guard Gait Distance (Feet): 220 Feet Assistive device: Rolling walker (2  wheeled) Gait Pattern/deviations: Step-through pattern;Shuffle;Trunk flexed Gait velocity: decreased Gait velocity interpretation: <1.31 ft/sec, indicative of household ambulator General Gait Details: amb around nursing station   Stairs             Wheelchair Mobility    Modified Rankin (Stroke Patients Only)       Balance Overall balance assessment: Needs assistance Sitting-balance support: Feet supported Sitting balance-Leahy Scale: Good     Standing balance support: Bilateral upper extremity supported Standing balance-Leahy Scale: Fair Standing balance comment: using RW due to her pain from the fall yesterday                            Cognition Arousal/Alertness: Awake/alert Behavior During Therapy: WFL for tasks assessed/performed Overall Cognitive Status: Within Functional Limits for tasks assessed                                 General Comments: positive about getting up to walk      Exercises      General Comments        Pertinent Vitals/Pain Pain Assessment: Faces Faces Pain Scale: Hurts little more Pain Location: back after fall yesterday Pain Descriptors / Indicators: Guarding Pain Intervention(s): Monitored during session;Repositioned    Home Living                      Prior Function  PT Goals (current goals can now be found in the care plan section) Acute Rehab PT Goals Patient Stated Goal: get home and feel better Progress towards PT goals: Progressing toward goals    Frequency    Min 3X/week      PT Plan Current plan remains appropriate    Co-evaluation              AM-PAC PT "6 Clicks" Mobility   Outcome Measure  Help needed turning from your back to your side while in a flat bed without using bedrails?: None Help needed moving from lying on your back to sitting on the side of a flat bed without using bedrails?: A Little Help needed moving to and from a bed to a  chair (including a wheelchair)?: A Little Help needed standing up from a chair using your arms (e.g., wheelchair or bedside chair)?: A Little Help needed to walk in hospital room?: A Little Help needed climbing 3-5 steps with a railing? : A Lot 6 Click Score: 18    End of Session Equipment Utilized During Treatment: Gait belt;Oxygen Activity Tolerance: Patient tolerated treatment well Patient left: in bed;Other (comment)(with chest tube on L side of bed) Nurse Communication: Mobility status PT Visit Diagnosis: Difficulty in walking, not elsewhere classified (R26.2);Unsteadiness on feet (R26.81);History of falling (Z91.81)     Time: 8721-5872 PT Time Calculation (min) (ACUTE ONLY): 30 min  Charges:  $Gait Training: 8-22 mins $Therapeutic Activity: 8-22 mins                     Ramond Dial 09/17/2018, 5:02 PM  Mee Hives, PT MS Acute Rehab Dept. Number: [place] and [place]

## 2018-09-17 NOTE — Progress Notes (Signed)
NAME:  [name], MRN:  [id number], DOB:  1932/06/28, LOS: 12 ADMISSION DATE:  09/05/2018, CONSULTATION DATE:  09/08/2018 REFERRING MD:  Marlowe Sax MD, CHIEF COMPLAINT:  Lt pneumothorax   Brief History   84 year old with COPD admitted for acute respiratory failure, multifocal pneumonia, DKA Developed spontaneous pneumothorax on 12/22.  PCCM called for management  Past Medical History  COPD gold stage 2 but severe reducton in dlco (fev may 2019 - 0.8L/60% and dlco 29%), type 2 diabetes, diabetes mellitus, coronary artery disease, GERD, Stage I lung cancer [presumed stage I LLL nodule - empiric XRT ending April 2019).  She follows with Dr. Chase Caller.  History noted for PET positive, left lower lobe spiculated nodule Discussed at multidisciplinary tumor board and biopsy deferred, Underwent SBRT in April 2019  [facility] Events   12/19 > admit 12/22 Lt spontaneous pneumothorax s/p chest tube placement 12/23 Chest tube placed 12/22 remains in place, to suction, without air leak. Patient reports better pain management with changes made to pain regimen by primary team 12/26 chest tube changed to water seal 12/27 chest tube clamped 12/29 recurrent PTX after chest tube clamped, placed back to suction 12/31 chest tube placed on water seal for one last attempt before proceeding with talc pleurodesis  Consults:  PCCM 12/22 >  Procedures:  Lt chest tube 12/22 >  Significant Diagnostic Tests:  CT chest 08/28/2018- advanced emphysema, left lower lobe lung nodule with bandlike interstitial airspace opacity suggestive of radiation fibrosis. Chest x-ray 12/22 > moderate-sized left pneumothorax CXR 12/23- interval improvement in size of L-sided PTX, however small sized L PTX still present. Pigtail placement unchanged.  CXR 12/26 > no PTX  Micro Data:  Blood culture 12/19- NGTD  Antimicrobials:  Ceftriaxone for 7 days 12/19>> Azithromycin for 5 days 12/19>>  Interim  history/subjective:  Comfortable.  CXR stable with chest tube on suction.  Objective   Blood pressure (!) 162/70, pulse (!) 51, temperature 98.3 F (36.8 C), temperature source Oral, resp. rate 16, height 5' (1.524 m), weight 45.4 kg, SpO2 93 %.        Intake/Output Summary (Last 24 hours) at 09/17/2018 1104 Last data filed at 09/16/2018 1929 Gross per 24 hour  Intake -  Output 0 ml  Net 0 ml   Filed Weights   09/05/18 1424  Weight: 45.4 kg     General - elderly female, resting in bed watching TV,  in NAD  ENT - AT, [place], no stridor no LAD  Cardiac -S1, S2, RRR, and no MRG  Chest -decreased breath sounds in the bases.  No wheezing noted. Left chest tube with no air leak Abdomen -soft nontender, positive bowel sounds  Extremities -moves all extremities well, no edema Skin -no rashes skin intact Neuro - A&O x 3, no deficits  Psych -pleasant  Resolved Hospital Problem list   DKA  Assessment & Plan:  84 year old with advanced emphysema, lung cancer - presumed LLL and empiric XRT April 2019 AE COPD, PNA, spontaneous left pneumothorax  Left spontaneous pneumothorax. Likely triggered by cough in setting of COPD with emphysema and severe DLCO reduction.  Has ipsilateral XRT in April 2019 for presumed lung cancer.  S/p chest tube placement 12/22.  Has now had pattern or lung re-expansion with suction; however, whenever chest tube is clamped, PTX returns. Plan - Place chest tube to water seal for one last attempt before proceeding with talc pleurodesis. - Repeat CXR at 1430.  - Will likely need talc pleurodesis.  Acute on chronic respiratory failure COPD, pneumonia Multifocal PNA appreciated on CXR 09/05/18-full course of abx completed.  Interval improvement per CXRs Plan - Continue anoro, budesonide. - Continue prednisone, have lowered from 40mg  to 30mg  daily.  Would continue taper to off. - Bronchial hygiene. - Mobilize as able.  Rest per primary team.   Montey Hora,  PA - [facility] Pager: [phone]  or [phone] 09/17/2018, 11:04 AM

## 2018-09-17 NOTE — Progress Notes (Signed)
PROGRESS NOTE  [name]:[id number] DOB: 08-07-1932 DOA: 09/05/2018 PCP: Harlan Stains, MD   LOS: 12 days   Brief Narrative / Interim history: 84 year old female with reduced DLCO, COPD, diabetes mellitus type 2, malnutrition, stage I lung cancer presumed stage I left lower lobe nodule with empiric radiation therapy finished in April 2019 was admitted with shortness of breath, diagnosed with COPD exacerbation with multifocal pneumonia and DKA.  Because of cough she developed spontaneous pneumothorax on 12/22, pulmonology was consulted and she had a chest tube placed.  Subjective: [place] in the room last night, complaining of right flank pain where she hit against the chair.  No shortness of breath, no chest pain, no palpitations  Assessment & Plan: Principal Problem:   Acute on chronic respiratory failure with hypoxia (HCC) Active Problems:   Essential hypertension   S/P CABG x 1   Recurrent pneumonia   Pulmonary HTN (HCC)   COPD with acute exacerbation (HCC)   Pneumonia   Protein-calorie malnutrition, severe   Pneumothorax on left   Acute pneumothorax   Principal Problem Acute on chronic respiratory failure with hypoxia secondary to multifocal pneumonia, COPD exacerbation, acute pneumothorax in the setting of severe reduction in DLCO, stage I lung CA -Patient actually presented with shortness of breath, coughing, initial chest x-ray showed bilateral lower lobe pneumonia -Patient was started on IV antibiotics, Zithromax and IV Rocephin and she completed a course while hospitalized with antibiotics discontinued on 12/26 -Urine strep and antigen negative, blood cultures negative.  She was also started on IV Solu-Medrol, subsequently transitioned to oral prednisone -Patient developed spontaneous left pneumothorax on 12/22. Pulmonology was consulted, underwent chest tube placement.  -She appears to have recurrent pneumothorax every time the chest tube is clamped requiring to  be placed back to wall suction.  Per pulmonary, attempt 1 more time today, if this fails she will need pleurodesis.  Additional Problems Type 2 diabetes mellitus, uncontrolled with hyperglycemia, DKA -CBGs worsened secondary to steroids, subsequently causing high anion gap metabolic acidosis.  Lactic acid was normal.  Patient has a history of type 2 diabetes mellitus and was recently taken off of for metformin. -Patient was placed on DKA protocol with IV fluids, IV insulin, now transitioned to subcu insulin -Continue current regimen, fasting 112 this morning  Fall -Continue patient education, PT  Chronic diastolic CHF -Euvolemic, Initially Lasix and Avapro were held due to acute kidney injury.  She remains euvolemic.  Accelerated hypertension -Continue current regimen, blood pressure slightly on the high side 458 systolic but in her age group I would avoid aggressive overcorrection  Dyslipidemia -Continue Pravachol  Severe protein calorie malnutrition -Dietitian consulted, continue nutritional supplement  Hyperkalemia -Resolved  Scheduled Meds: . amLODipine  10 mg Oral Daily  . aspirin EC  81 mg Oral QHS  . benzonatate  100 mg Oral TID  . budesonide (PULMICORT) nebulizer solution  0.5 mg Nebulization BID  . buPROPion  150 mg Oral Daily  . cholecalciferol  1,000 Units Oral QHS  . cloNIDine  0.1 mg Oral Q8H  . dextromethorphan-guaiFENesin  1 tablet Oral BID  . enoxaparin (LOVENOX) injection  30 mg Subcutaneous Q24H  . feeding supplement (GLUCERNA SHAKE)  237 mL Oral TID BM  . ferrous sulfate  325 mg Oral BID  . hydrALAZINE  10 mg Oral Q8H  . insulin aspart  0-5 Units Subcutaneous QHS  . insulin aspart  0-9 Units Subcutaneous TID WC  . insulin glargine  10 Units Subcutaneous Daily  .  pravastatin  20 mg Oral QHS  . [START ON 09/18/2018] predniSONE  30 mg Oral Q breakfast  . sertraline  100 mg Oral q morning - 10a  . umeclidinium-vilanterol  1 puff Inhalation Daily    Continuous Infusions: PRN Meds:.acetaminophen, albuterol, antiseptic oral rinse, guaiFENesin, hydrALAZINE, menthol-cetylpyridinium, oxyCODONE-acetaminophen **AND** oxyCODONE, traZODone  DVT prophylaxis: Lovenox Code Status: Full code Family Communication: No family present at bedside Disposition Plan: Home when cleared by pulmonology  Consultants:   Pulmonary  Procedures:   Chest tube placement on 12/22  Antimicrobials:  Ceftriaxone/azithromycin 12/19 >>/26  Objective: Vitals:   09/16/18 2323 09/17/18 0627 09/17/18 0636 09/17/18 0733  BP: (!) 205/68 (!) 162/70 (!) 162/70   Pulse: (!) 53  (!) 51   Resp: 16     Temp: 97.8 F (36.6 C)  98.3 F (36.8 C)   TempSrc: Oral  Oral   SpO2: 97%  100% 93%  Weight:      Height:        Intake/Output Summary (Last 24 hours) at 09/17/2018 1121 Last data filed at 09/16/2018 1929 Gross per 24 hour  Intake -  Output 0 ml  Net 0 ml   Filed Weights   09/05/18 1424  Weight: 45.4 kg    Examination:  Constitutional: NAD, calm, comfortable Eyes: PERRL, lids and conjunctivae normal ENMT: Mucous membranes are moist.  Neck: normal, supple Respiratory: clear to auscultation bilaterally, no wheezing, no crackles. Normal respiratory effort.  Cardiovascular: Regular rate and rhythm. No LE edema. 2+ pedal pulses.  Abdomen: no tenderness. Bowel sounds positive.  Skin: no rashes, small abrasion right lower flank Neurologic: non focal   Data Reviewed: I have independently reviewed following labs and imaging studies   CBC: Recent Labs  Lab 09/11/18 0246 09/15/18 0146 09/16/18 0241  WBC 13.7* 14.3* 13.5*  HGB 10.6* 10.8* 10.2*  HCT 33.3* 35.2* 32.7*  MCV 91.0 91.0 91.1  PLT 291 318 213   Basic Metabolic Panel: Recent Labs  Lab 09/11/18 0246  09/12/18 0554 09/13/18 0247 09/13/18 0628 09/14/18 0227 09/15/18 0146 09/16/18 0241  NA  --   --  139 137  --  140 138 138  K  --    < > 3.9 5.9* 4.0 3.3* 3.8 3.6  CL  --   --   103 105  --  103 102 105  CO2  --   --  26 22  --  29 25 25   GLUCOSE  --   --  97 145*  --  79 170* 170*  BUN  --   --  33* 33*  --  24* 31* 32*  CREATININE  --   --  1.22* 1.22*  --  1.14* 1.41* 1.04*  CALCIUM  --   --  8.8* 8.8*  --  8.6* 8.9 9.3  MG 1.9  --  2.0 2.1  --  1.9  --   --   PHOS 4.2  --  3.5 3.1  --  3.2  --   --    < > = values in this interval not displayed.   GFR: Estimated Creatinine Clearance: 28.9 mL/min (A) (by C-G formula based on SCr of 1.04 mg/dL (H)). Liver Function Tests: No results for input(s): AST, ALT, ALKPHOS, BILITOT, PROT, ALBUMIN in the last 168 hours. No results for input(s): LIPASE, AMYLASE in the last 168 hours. No results for input(s): AMMONIA in the last 168 hours. Coagulation Profile: No results for input(s): INR, PROTIME in the last 168  hours. Cardiac Enzymes: No results for input(s): CKTOTAL, CKMB, CKMBINDEX, TROPONINI in the last 168 hours. BNP (last 3 results) No results for input(s): PROBNP in the last 8760 hours. HbA1C: No results for input(s): HGBA1C in the last 72 hours. CBG: Recent Labs  Lab 09/16/18 0735 09/16/18 1138 09/16/18 1702 09/16/18 2103 09/17/18 0731  GLUCAP 103* 233* 165* 234* 112*   Lipid Profile: No results for input(s): CHOL, HDL, LDLCALC, TRIG, CHOLHDL, LDLDIRECT in the last 72 hours. Thyroid Function Tests: No results for input(s): TSH, T4TOTAL, FREET4, T3FREE, THYROIDAB in the last 72 hours. Anemia Panel: No results for input(s): VITAMINB12, FOLATE, FERRITIN, TIBC, IRON, RETICCTPCT in the last 72 hours. Urine analysis:    Component Value Date/Time   COLORURINE YELLOW 03/02/2017 2156   APPEARANCEUR HAZY (A) 03/02/2017 2156   LABSPEC 1.014 03/02/2017 2156   PHURINE 5.0 03/02/2017 2156   GLUCOSEU NEGATIVE 03/02/2017 2156   HGBUR NEGATIVE 03/02/2017 2156   BILIRUBINUR NEGATIVE 03/02/2017 2156   [place] 03/02/2017 2156   PROTEINUR 30 (A) 03/02/2017 2156   UROBILINOGEN 0.2 07/11/2013 1416    NITRITE NEGATIVE 03/02/2017 2156   LEUKOCYTESUR SMALL (A) 03/02/2017 2156   Sepsis Labs: Invalid input(s): PROCALCITONIN, LACTICIDVEN  No results found for this or any previous visit (from the past 240 hour(s)).    Radiology Studies: Dg Chest Port 1 View  Result Date: 09/17/2018 CLINICAL DATA:  Chest tube.  Shortness of breath.  Sore chest. EXAM: PORTABLE CHEST 1 VIEW COMPARISON:  09/15/2018. FINDINGS: Left chest tube in stable position. Again no definite pneumothorax noted. Prior cardiac valve replacement. Heart size normal. Bibasilar atelectasis/infiltrates and small pleural effusions again noted. IMPRESSION: 1. Left chest tube in stable position. Again no definite pneumothorax identified. 2.  Prior cardiac valve replacement.  Heart size stable. 3. Stable bibasilar atelectasis/infiltrates and small bilateral pleural effusions. Electronically Signed   By: Marcello Moores  Register   On: 09/17/2018 06:57    Marzetta Board, MD, PhD [facility] Pager [phone]  If 7PM-7AM, please contact night-coverage [email or web address] Password [email or web address] 09/17/2018, 11:21 AM

## 2018-09-17 NOTE — Procedures (Signed)
Chest Tube Insertion Procedure Note  Indications:  Clinically significant Pneumothorax  Pre-operative Diagnosis: Pneumothorax  Post-operative Diagnosis: Pneumothorax  Procedure Details  Informed consent was obtained for the procedure, including sedation.  Risks of lung perforation, hemorrhage, arrhythmia, and adverse drug reaction were discussed.   After sterile skin prep, using standard technique, a 14 French tube was placed in the left lateral 5th rib space.  Findings: Sero-sanginous fluid  Estimated Blood Loss:  Minimal         Specimens:  None              Complications:  None; patient tolerated the procedure well.         Disposition: 2w         Condition: stable  Attending Attestation: I performed the procedure.  Kipp Brood, MD [facility] ICU Physician [employer]  Pager: [phone] Mobile: [phone] After hours: [phone].  09/17/2018, 4:04 PM

## 2018-09-18 ENCOUNTER — Inpatient Hospital Stay (HOSPITAL_COMMUNITY): Payer: Medicare Other

## 2018-09-18 DIAGNOSIS — Z9689 Presence of other specified functional implants: Secondary | ICD-10-CM

## 2018-09-18 LAB — GLUCOSE, CAPILLARY
GLUCOSE-CAPILLARY: 152 mg/dL — AB (ref 70–99)
Glucose-Capillary: 220 mg/dL — ABNORMAL HIGH (ref 70–99)
Glucose-Capillary: 266 mg/dL — ABNORMAL HIGH (ref 70–99)
Glucose-Capillary: 236 mg/dL — ABNORMAL HIGH (ref 70–99)

## 2018-09-18 LAB — BASIC METABOLIC PANEL
Anion gap: 7 (ref 5–15)
BUN: 34 mg/dL — AB (ref 8–23)
CO2: 26 mmol/L (ref 22–32)
Calcium: 9.2 mg/dL (ref 8.9–10.3)
Chloride: 106 mmol/L (ref 98–111)
Creatinine, Ser: 1.12 mg/dL — ABNORMAL HIGH (ref 0.44–1.00)
GFR calc Af Amer: 52 mL/min — ABNORMAL LOW (ref >60)
GFR calc non Af Amer: 45 mL/min — ABNORMAL LOW (ref >60)
Glucose, Bld: 138 mg/dL — ABNORMAL HIGH (ref 70–99)
Potassium: 4.2 mmol/L (ref 3.5–5.1)
SODIUM: 139 mmol/L (ref 135–145)

## 2018-09-18 LAB — CBC
HCT: 31 % — ABNORMAL LOW (ref 36.0–46.0)
Hemoglobin: 9.7 g/dL — ABNORMAL LOW (ref 12.0–15.0)
MCH: 29 pg (ref 26.0–34.0)
MCHC: 31.3 g/dL (ref 30.0–36.0)
MCV: 92.5 fL (ref 80.0–100.0)
PLATELETS: 282 10*3/uL (ref 150–400)
RBC: 3.35 MIL/uL — ABNORMAL LOW (ref 3.87–5.11)
RDW: 14.4 % (ref 11.5–15.5)
WBC: 20.2 10*3/uL — ABNORMAL HIGH (ref 4.0–10.5)
nRBC: 0 % (ref 0.0–0.2)

## 2018-09-18 MED ORDER — SENNOSIDES-DOCUSATE SODIUM 8.6-50 MG PO TABS
1.0000 | ORAL_TABLET | Freq: Two times a day (BID) | ORAL | Status: DC
  Administered 2018-09-18 – 2018-09-20 (×4): 1 via ORAL
  Filled 2018-09-18 (×4): qty 1

## 2018-09-18 MED ORDER — LIDOCAINE HCL 1 % IJ SOLN
10.0000 mL | Freq: Once | INTRAMUSCULAR | Status: AC
  Administered 2018-09-18: 10 mL via INTRAPLEURAL
  Filled 2018-09-18 (×2): qty 10

## 2018-09-18 MED ORDER — MORPHINE SULFATE (PF) 2 MG/ML IV SOLN
1.0000 mg | Freq: Once | INTRAVENOUS | Status: AC
  Administered 2018-09-18: 1 mg via INTRAVENOUS
  Filled 2018-09-18: qty 1

## 2018-09-18 MED ORDER — POLYETHYLENE GLYCOL 3350 17 G PO PACK
17.0000 g | PACK | Freq: Every day | ORAL | Status: DC
  Administered 2018-09-18 – 2018-09-20 (×3): 17 g via ORAL
  Filled 2018-09-18 (×3): qty 1

## 2018-09-18 NOTE — Progress Notes (Signed)
Patient and husband updated at bedside.  CXR with increased SQ air but no pneumothorax.  Images reviewed with Dr. Zigmund Daniel / Radiology.  No air leak on exam with deep breath or cough.  Reviewed indications for talc pleurodesis and patient / husband indicate understanding.   Plan: Proceed with instillation of talc slurry  Pre-medicate with 1mg  morphine  Leave chest tube clamped for 2 hours post procedure then unclamp Follow up CXR in am   Desiree Gens, NP-C [facility] Pgr: [phone] or if no answer [phone] 09/18/2018, 9:53 AM

## 2018-09-18 NOTE — Progress Notes (Signed)
Patient states no BM since 12/29, feels constipated. BS +. Patient request sme liquid she had a few days ago (kayexalate) in the AM to assist and would also like nightly miralax/colace to prevent further constipation r/t narcs/iron. MD paged.

## 2018-09-18 NOTE — Care Management Important Message (Signed)
Important Message  Patient Details  Name: [name] MRN: [id number] Date of Birth: Sep 12, 1932   Medicare Important Message Given:  Yes    Barb Merino [place] 09/18/2018, 12:39 PM

## 2018-09-18 NOTE — Progress Notes (Signed)
PROGRESS NOTE  [name] IOX:[id number] DOB: Dec 27, 1931 DOA: 09/05/2018 PCP: Harlan Stains, MD   LOS: 13 days   Brief Narrative / Interim history: 85 year old female with reduced DLCO, COPD, diabetes mellitus type 2, malnutrition, stage I lung cancer presumed stage I left lower lobe nodule with empiric radiation therapy finished in April 2019 was admitted with shortness of breath, diagnosed with COPD exacerbation with multifocal pneumonia and DKA.  Because of cough she developed spontaneous pneumothorax on 12/22, pulmonology was consulted and she had a chest tube placed.  Subjective: No nausea no vomiting.  Cough is still bothering her.  No fever no chills.  Had a lot of chest pain after talc procedure.  Assessment & Plan: Principal Problem:   Acute on chronic respiratory failure with hypoxia (HCC) Active Problems:   Essential hypertension   S/P CABG x 1   Recurrent pneumonia   Pulmonary HTN (HCC)   COPD with acute exacerbation (HCC)   Pneumonia   Protein-calorie malnutrition, severe   Pneumothorax on left   Acute pneumothorax   Principal Problem Acute on chronic respiratory failure with hypoxia secondary to multifocal pneumonia, COPD exacerbation, acute pneumothorax in the setting of severe reduction in DLCO, stage I lung CA -Patient actually presented with shortness of breath, coughing, initial chest x-ray showed bilateral lower lobe pneumonia -Patient was started on IV antibiotics, Zithromax and IV Rocephin and she completed a course while hospitalized with antibiotics discontinued on 12/26 -Urine strep and antigen negative, blood cultures negative.  She was also started on IV Solu-Medrol, subsequently transitioned to oral prednisone -Patient developed spontaneous left pneumothorax on 12/22. Pulmonology was consulted, underwent chest tube placement.  -She appears to have recurrent pneumothorax every time the chest tube is clamped requiring to be placed back to wall  suction.  Underwent pleurodesis on 09/18/2018  Additional Problems Type 2 diabetes mellitus, uncontrolled with hyperglycemia, DKA -CBGs worsened secondary to steroids, subsequently causing high anion gap metabolic acidosis.  Lactic acid was normal.  Patient has a history of type 2 diabetes mellitus and was recently taken off of for metformin. -Patient was placed on DKA protocol with IV fluids, IV insulin, now transitioned to subcu insulin -Continue current regimen,  Fall -Continue patient education, PT  Chronic diastolic CHF -Euvolemic, Initially Lasix and Avapro were held due to acute kidney injury.  She remains euvolemic.  Accelerated hypertension -Continue current regimen, blood pressure slightly on the high side 268 systolic but in her age group I would avoid aggressive overcorrection  Dyslipidemia -Continue Pravachol  Severe protein calorie malnutrition -Dietitian consulted, continue nutritional supplement  Hyperkalemia -Resolved  Scheduled Meds: . albuterol  2.5 mg Nebulization TID  . amLODipine  10 mg Oral Daily  . aspirin EC  81 mg Oral QHS  . benzonatate  100 mg Oral TID  . budesonide (PULMICORT) nebulizer solution  0.5 mg Nebulization BID  . buPROPion  150 mg Oral Daily  . cholecalciferol  1,000 Units Oral QHS  . cloNIDine  0.1 mg Oral Q8H  . dextromethorphan-guaiFENesin  1 tablet Oral BID  . enoxaparin (LOVENOX) injection  30 mg Subcutaneous Q24H  . feeding supplement (GLUCERNA SHAKE)  237 mL Oral TID BM  . ferrous sulfate  325 mg Oral BID  . hydrALAZINE  10 mg Oral Q8H  . insulin aspart  0-5 Units Subcutaneous QHS  . insulin aspart  0-9 Units Subcutaneous TID WC  . insulin glargine  10 Units Subcutaneous Daily  . pravastatin  20 mg Oral QHS  .  predniSONE  30 mg Oral Q breakfast  . sertraline  100 mg Oral q morning - 10a  . umeclidinium-vilanterol  1 puff Inhalation Daily   Continuous Infusions: PRN Meds:.acetaminophen, antiseptic oral rinse,  guaiFENesin, hydrALAZINE, menthol-cetylpyridinium, oxyCODONE-acetaminophen **AND** oxyCODONE, traZODone  DVT prophylaxis: Lovenox Code Status: Full code Family Communication: No family present at bedside Disposition Plan: Home when cleared by pulmonology  Consultants:   Pulmonary  Procedures:   Chest tube placement on 12/22  Antimicrobials:  Ceftriaxone/azithromycin 12/19 >>/26  Objective: Vitals:   09/18/18 0740 09/18/18 1323 09/18/18 1332 09/18/18 1335  BP: (!) 156/44  123/65 134/62  Pulse: 66 68    Resp: 18 18    Temp: 97.6 F (36.4 C)     TempSrc: Oral     SpO2: 90% 93%    Weight:      Height:        Intake/Output Summary (Last 24 hours) at 09/18/2018 1603 Last data filed at 09/18/2018 1200 Gross per 24 hour  Intake 720 ml  Output 690 ml  Net 30 ml   Filed Weights   09/05/18 1424  Weight: 45.4 kg    Examination:  Constitutional: NAD, calm, comfortable Eyes: PERRL, lids and conjunctivae normal ENMT: Mucous membranes are moist.  Neck: normal, supple Respiratory: clear to auscultation bilaterally, no wheezing, no crackles. Normal respiratory effort.  Cardiovascular: Regular rate and rhythm. No LE edema. 2+ pedal pulses.  Abdomen: no tenderness. Bowel sounds positive.  Skin: no rashes, small abrasion right lower flank Neurologic: non focal   Data Reviewed: I have independently reviewed following labs and imaging studies   CBC: Recent Labs  Lab 09/15/18 0146 09/16/18 0241 09/18/18 0309  WBC 14.3* 13.5* 20.2*  HGB 10.8* 10.2* 9.7*  HCT 35.2* 32.7* 31.0*  MCV 91.0 91.1 92.5  PLT 318 288 599   Basic Metabolic Panel: Recent Labs  Lab 09/12/18 0554 09/13/18 0247 09/13/18 0628 09/14/18 0227 09/15/18 0146 09/16/18 0241 09/18/18 0309  NA 139 137  --  140 138 138 139  K 3.9 5.9* 4.0 3.3* 3.8 3.6 4.2  CL 103 105  --  103 102 105 106  CO2 26 22  --  29 25 25 26   GLUCOSE 97 145*  --  79 170* 170* 138*  BUN 33* 33*  --  24* 31* 32* 34*  CREATININE  1.22* 1.22*  --  1.14* 1.41* 1.04* 1.12*  CALCIUM 8.8* 8.8*  --  8.6* 8.9 9.3 9.2  MG 2.0 2.1  --  1.9  --   --   --   PHOS 3.5 3.1  --  3.2  --   --   --    GFR: Estimated Creatinine Clearance: 26.8 mL/min (A) (by C-G formula based on SCr of 1.12 mg/dL (H)). Liver Function Tests: No results for input(s): AST, ALT, ALKPHOS, BILITOT, PROT, ALBUMIN in the last 168 hours. No results for input(s): LIPASE, AMYLASE in the last 168 hours. No results for input(s): AMMONIA in the last 168 hours. Coagulation Profile: No results for input(s): INR, PROTIME in the last 168 hours. Cardiac Enzymes: No results for input(s): CKTOTAL, CKMB, CKMBINDEX, TROPONINI in the last 168 hours. BNP (last 3 results) No results for input(s): PROBNP in the last 8760 hours. HbA1C: No results for input(s): HGBA1C in the last 72 hours. CBG: Recent Labs  Lab 09/17/18 1201 09/17/18 1653 09/17/18 2107 09/18/18 0740 09/18/18 1212  GLUCAP 195* 264* 221* 152* 220*   Lipid Profile: No results for input(s): CHOL, HDL,  LDLCALC, TRIG, CHOLHDL, LDLDIRECT in the last 72 hours. Thyroid Function Tests: No results for input(s): TSH, T4TOTAL, FREET4, T3FREE, THYROIDAB in the last 72 hours. Anemia Panel: No results for input(s): VITAMINB12, FOLATE, FERRITIN, TIBC, IRON, RETICCTPCT in the last 72 hours. Urine analysis:    Component Value Date/Time   COLORURINE YELLOW 03/02/2017 2156   APPEARANCEUR HAZY (A) 03/02/2017 2156   LABSPEC 1.014 03/02/2017 2156   PHURINE 5.0 03/02/2017 2156   GLUCOSEU NEGATIVE 03/02/2017 2156   HGBUR NEGATIVE 03/02/2017 2156   BILIRUBINUR NEGATIVE 03/02/2017 2156   [place] 03/02/2017 2156   PROTEINUR 30 (A) 03/02/2017 2156   UROBILINOGEN 0.2 07/11/2013 1416   NITRITE NEGATIVE 03/02/2017 2156   LEUKOCYTESUR SMALL (A) 03/02/2017 2156   Sepsis Labs: Invalid input(s): PROCALCITONIN, LACTICIDVEN  No results found for this or any previous visit (from the past 240 hour(s)).     Radiology Studies: Dg Chest Port 1 View  Addendum Date: 09/18/2018   ADDENDUM REPORT: 09/18/2018 10:14 ADDENDUM: When I initially reviewed the examination with the care provider, I did not detect the subtle right apical pneumothorax. However, upon further review there is a tiny residual left apical pneumothorax. I contacted the patient's nurse to communicate this finding to the care provider. Critical Value/emergent results were called by telephone at the time of interpretation on 09/18/2018 at 10:13 am to the patient's nurse , who verbally acknowledged these results. Electronically Signed   By: Lorriane Shire M.D.   On: 09/18/2018 10:14   Result Date: 09/18/2018 CLINICAL DATA:  Pneumothorax. EXAM: PORTABLE CHEST 1 VIEW COMPARISON:  09/17/2018 FINDINGS: Left chest tube remains in place. The tiny pneumothorax at the left base has resolved. There is a very subtle small left apical pneumothorax, diminished. Increased subcutaneous emphysema on the left. Aeration at the bases has slightly improved. Severe emphysematous changes in the upper lung zones. Heart size is normal. Chronic prominence of the pulmonary arteries. IMPRESSION: Minimal residual left apical pneumothorax. Improved aeration at the bases. Electronically Signed: By: Lorriane Shire M.D. On: 09/18/2018 10:02   Dg Chest Port 1 View  Result Date: 09/17/2018 CLINICAL DATA:  Insertion of new left chest tube. Left pneumothorax. EXAM: PORTABLE CHEST 1 VIEW 4:15 p.m. COMPARISON:  09/17/2018 at 2:28 p.m. and CT scan of the chest dated 08/28/2018 FINDINGS: The prior chest tube has been removed and a new chest tube has been inserted at the left lung base. There is only a tiny residual left apical pneumothorax and there is also a tiny pneumothorax at the left base laterally. There is new slight subcutaneous emphysema on the left extending into the left side of the neck. There is increased accentuation of the interstitial markings at the left lung base since  the prior exam. There is chronic accentuation of the interstitial markings at the right base. Severe emphysematous changes are noted in the upper lung zones. Heart size and vascularity are normal. Aortic atherosclerosis. IMPRESSION: 1. New left chest tube. Tiny residual left pneumothorax at the left apex and left base. 2. Increased interstitial markings at the left lung base since the prior exam of unknown etiology. Electronically Signed   By: Lorriane Shire M.D.   On: 09/17/2018 16:28   Dg Chest Port 1 View  Result Date: 09/17/2018 CLINICAL DATA:  History of left pneumothorax with a chest tube in place. EXAM: PORTABLE CHEST 1 VIEW COMPARISON:  Single-view of the chest earlier today. FINDINGS: Left chest tube is in place. There is a left apical pneumothorax estimated at  20%. Bibasilar airspace disease is worse on the right and unchanged in appearance. Severe emphysema is seen. Heart size is normal. Aortic atherosclerosis is noted. IMPRESSION: Left pneumothorax estimated at 20% with a chest tube in place. No change in right greater than left basilar airspace disease. Emphysema. Atherosclerosis. Electronically Signed   By: Inge Rise M.D.   On: 09/17/2018 14:44   Dg Chest Port 1 View  Result Date: 09/17/2018 CLINICAL DATA:  Chest tube.  Shortness of breath.  Sore chest. EXAM: PORTABLE CHEST 1 VIEW COMPARISON:  09/15/2018. FINDINGS: Left chest tube in stable position. Again no definite pneumothorax noted. Prior cardiac valve replacement. Heart size normal. Bibasilar atelectasis/infiltrates and small pleural effusions again noted. IMPRESSION: 1. Left chest tube in stable position. Again no definite pneumothorax identified. 2.  Prior cardiac valve replacement.  Heart size stable. 3. Stable bibasilar atelectasis/infiltrates and small bilateral pleural effusions. Electronically Signed   By: Marcello Moores  Register   On: 09/17/2018 06:57    Author:  Berle Mull, MD [facility] Pager:  [phone] 09/18/2018 4:04 PM     If 7PM-7AM, please contact night-coverage [email or web address] Password [email or web address] 09/18/2018, 4:03 PM

## 2018-09-18 NOTE — Progress Notes (Signed)
NAME:  [name], MRN:  [id number], DOB:  09/28/1931, LOS: 71 ADMISSION DATE:  09/05/2018, CONSULTATION DATE:  09/08/2018 REFERRING MD:  Marlowe Sax MD, CHIEF COMPLAINT:  Lt pneumothorax   Brief History   85 year old with COPD admitted for acute respiratory failure, multifocal pneumonia, DKA Developed spontaneous pneumothorax on 12/22.  PCCM called for management  Past Medical History  COPD gold stage 2 but severe reducton in dlco (fev may 2019 - 0.8L/60% and dlco 29%), type 2 diabetes, diabetes mellitus, coronary artery disease, GERD, Stage I lung cancer [presumed stage I LLL nodule - empiric XRT ending April 2019).  She follows with Dr. Chase Caller.  History noted for PET positive, left lower lobe spiculated nodule Discussed at multidisciplinary tumor board and biopsy deferred, Underwent SBRT in April 2019  [facility] Events   12/19 Admit 12/22 Lt spontaneous pneumothorax s/p chest tube placement 12/23 Chest tube placed 12/22 remains in place, to suction, without air leak. Patient reports better pain management with changes made to pain regimen by primary team 12/26 chest tube changed to water seal 12/27 chest tube clamped 12/29 recurrent PTX after chest tube clamped, placed back to suction 12/31 chest tube placed on water seal for one last attempt before proceeding with talc pleurodesis 01/01 Talc pleurodesis   Consults:  PCCM 12/22 >  Procedures:  Lt chest tube 12/22 >  Significant Diagnostic Tests:  CT chest 08/28/2018- advanced emphysema, left lower lobe lung nodule with bandlike interstitial airspace opacity suggestive of radiation fibrosis. Chest x-ray 12/22 > moderate-sized left pneumothorax CXR 12/23- interval improvement in size of L-sided PTX, however small sized L PTX still present. Pigtail placement unchanged.  CXR 12/26 > no PTX  Micro Data:  Blood culture 12/19 >> negative  Antimicrobials:  Ceftriaxone for 7 days 12/19>> Azithromycin for 5 days  12/19>>  Interim history/subjective:  Pt reports ongoing wet cough but improved.  Husband at bedside.  Reviewed talc procedure with patient/family.  Questions answered.    Objective   Blood pressure (!) 156/44, pulse 66, temperature 97.7 F (36.5 C), temperature source Oral, resp. rate 16, height 5' (1.524 m), weight 45.4 kg, SpO2 90 %.        Intake/Output Summary (Last 24 hours) at 09/18/2018 0954 Last data filed at 09/18/2018 0656 Gross per 24 hour  Intake 300 ml  Output 690 ml  Net -390 ml   Filed Weights   09/05/18 1424  Weight: 45.4 kg   General: elderly female lying in bed in NAD, husband at bedside  HEENT: MM pink/moist, no jvd Neuro: AAOx4, speech clear, MAE CV: s1s2 rrr, no m/r/g PULM: even/non-labored, clear on right, crackles on left  PJ:ASNK, non-tender, bsx4 active  Extremities: warm/dry, no edema, bruising to left posterior flank from prior fall  Skin: no rashes or lesions  Resolved Hospital Problem list   DKA  Assessment & Plan:  85 year old with advanced emphysema, lung cancer - presumed LLL and empiric XRT April 2019 AE COPD, PNA, spontaneous left pneumothorax  Left spontaneous pneumothorax. Likely triggered by cough in setting of COPD with emphysema and severe DLCO reduction.  Has ipsilateral XRT in April 2019 for presumed lung cancer.  S/p chest tube placement 12/22.  Has now had pattern or lung re-expansion with suction; however, whenever chest tube is clamped, PTX returns. Plan Talc pleurodesis performed 1/1 Leave chest tube clamped until 12:00 then can unclamp, reviewed with RN  Follow up CXR in am   Acute on chronic respiratory failure COPD, pneumonia Multifocal  PNA appreciated on CXR 09/05/18-full course of abx completed.  Interval improvement per CXRs Plan Continue budesonide, anoro Prednisone 30mg  QD with taper to off  Pulmonary hygiene  Mobilize as able   Rest per primary team.  Noe Gens, NP-C [facility] Pgr:  [phone] or if no answer [phone] 09/18/2018, 9:54 AM

## 2018-09-19 ENCOUNTER — Inpatient Hospital Stay (HOSPITAL_COMMUNITY): Payer: Medicare Other

## 2018-09-19 LAB — BASIC METABOLIC PANEL
Anion gap: 10 (ref 5–15)
BUN: 43 mg/dL — ABNORMAL HIGH (ref 8–23)
CHLORIDE: 104 mmol/L (ref 98–111)
CO2: 23 mmol/L (ref 22–32)
Calcium: 10.1 mg/dL (ref 8.9–10.3)
Creatinine, Ser: 1.39 mg/dL — ABNORMAL HIGH (ref 0.44–1.00)
GFR calc Af Amer: 40 mL/min — ABNORMAL LOW (ref >60)
GFR calc non Af Amer: 35 mL/min — ABNORMAL LOW (ref >60)
Glucose, Bld: 141 mg/dL — ABNORMAL HIGH (ref 70–99)
Potassium: 4.7 mmol/L (ref 3.5–5.1)
Sodium: 137 mmol/L (ref 135–145)

## 2018-09-19 LAB — CBC WITH DIFFERENTIAL/PLATELET
Abs Immature Granulocytes: 0.21 10*3/uL — ABNORMAL HIGH (ref 0.00–0.07)
Basophils Absolute: 0.1 10*3/uL (ref 0.0–0.1)
Basophils Relative: 0 %
Eosinophils Absolute: 0.1 10*3/uL (ref 0.0–0.5)
Eosinophils Relative: 1 %
HCT: 34.2 % — ABNORMAL LOW (ref 36.0–46.0)
Hemoglobin: 10.9 g/dL — ABNORMAL LOW (ref 12.0–15.0)
IMMATURE GRANULOCYTES: 1 %
Lymphocytes Relative: 6 %
Lymphs Abs: 1.5 10*3/uL (ref 0.7–4.0)
MCH: 29.5 pg (ref 26.0–34.0)
MCHC: 31.9 g/dL (ref 30.0–36.0)
MCV: 92.4 fL (ref 80.0–100.0)
Monocytes Absolute: 1.4 10*3/uL — ABNORMAL HIGH (ref 0.1–1.0)
Monocytes Relative: 5 %
NEUTROS ABS: 23.3 10*3/uL — AB (ref 1.7–7.7)
Neutrophils Relative %: 87 %
Platelets: 298 10*3/uL (ref 150–400)
RBC: 3.7 MIL/uL — ABNORMAL LOW (ref 3.87–5.11)
RDW: 14.7 % (ref 11.5–15.5)
WBC: 26.6 10*3/uL — ABNORMAL HIGH (ref 4.0–10.5)
nRBC: 0 % (ref 0.0–0.2)

## 2018-09-19 LAB — GLUCOSE, CAPILLARY
GLUCOSE-CAPILLARY: 229 mg/dL — AB (ref 70–99)
Glucose-Capillary: 143 mg/dL — ABNORMAL HIGH (ref 70–99)
Glucose-Capillary: 89 mg/dL (ref 70–99)
Glucose-Capillary: 161 mg/dL — ABNORMAL HIGH (ref 70–99)

## 2018-09-19 MED ORDER — PREDNISONE 20 MG PO TABS
20.0000 mg | ORAL_TABLET | Freq: Every day | ORAL | Status: DC
  Administered 2018-09-20: 20 mg via ORAL
  Filled 2018-09-19 (×2): qty 1

## 2018-09-19 NOTE — Progress Notes (Addendum)
NAME:  [name], MRN:  [id number], DOB:  [birth year], LOS: 55 ADMISSION DATE:  09/05/2018, CONSULTATION DATE:  09/08/2018 REFERRING MD:  Marlowe Sax MD, CHIEF COMPLAINT:  Lt pneumothorax   Brief History   85 year old with COPD admitted for acute respiratory failure, multifocal pneumonia, DKA Developed spontaneous pneumothorax on 12/22.  PCCM called for management  Past Medical History  COPD gold stage 2 but severe reducton in dlco (fev may 2019 - 0.8L/60% and dlco 29%), type 2 diabetes, diabetes mellitus, coronary artery disease, GERD, Stage I lung cancer [presumed stage I LLL nodule - empiric XRT ending April 2019).  She follows with Dr. Chase Caller.  History noted for PET positive, left lower lobe spiculated nodule Discussed at multidisciplinary tumor board and biopsy deferred, Underwent SBRT in April 2019  [facility] Events   12/19 Admit 12/22 Lt spontaneous pneumothorax s/p chest tube placement 12/23 Chest tube placed 12/22 remains in place, to suction, without air leak. Patient reports better pain management with changes made to pain regimen by primary team 12/26 chest tube changed to water seal 12/27 chest tube clamped 12/29 recurrent PTX after chest tube clamped, placed back to suction 12/31 chest tube placed on water seal for one last attempt before proceeding with talc pleurodesis 01/01 Talc pleurodesis   Consults:  PCCM 12/22 >  Procedures:  Lt chest tube 12/22 >  Significant Diagnostic Tests:  CT chest 08/28/2018- advanced emphysema, left lower lobe lung nodule with bandlike interstitial airspace opacity suggestive of radiation fibrosis. Chest x-ray 12/22 > moderate-sized left pneumothorax CXR 12/23- interval improvement in size of L-sided PTX, however small sized L PTX still present. Pigtail placement unchanged.  CXR 12/26 > no PTX  Micro Data:  Blood culture 12/19 >> negative  Antimicrobials:  Ceftriaxone for 7 days 12/19 >> 12/25 Azithromycin for 5  days 12/19>> 12/23  Interim history/subjective:  Talc pleurodesis completed 1/1.  Pt reports constipation.   Objective   Blood pressure 140/64, pulse 96, temperature 98.5 F (36.9 C), temperature source Oral, resp. rate 14, height 5' (1.524 m), weight 45.4 kg, SpO2 95 %.        Intake/Output Summary (Last 24 hours) at 09/19/2018 1517 Last data filed at 09/19/2018 6160 Gross per 24 hour  Intake 480 ml  Output 40 ml  Net 440 ml   Filed Weights   09/05/18 1424  Weight: 45.4 kg   Exam: General: frail elderly female lying in bed in NAD HEENT: MM pink/moist, no jvd  Neuro: Awake, alert, speech clear, oriented but does seem to have short term memory loss CV: s1s2 rrr, no m/r/g PULM: even/non-labored, lungs bilaterally with crackles R>L, left chest tube in place, clamped at 0910 VP:XTGG, non-tender, bsx4 active  Extremities: warm/dry, no edema  Skin: no rashes or lesions  Resolved Hospital Problem list   DKA  Assessment & Plan:  85 year old with advanced emphysema, lung cancer - presumed LLL and empiric XRT April 2019 AE COPD, PNA, spontaneous left pneumothorax  Left spontaneous pneumothorax. Likely triggered by cough in setting of COPD with emphysema and severe DLCO reduction.  Has ipsilateral XRT in April 2019 for presumed lung cancer.  S/p chest tube placement 12/22.  Has now had pattern or lung re-expansion with suction; however, whenever chest tube is clamped, PTX returns. P: CXR with no pneumothorax, clamp chest tube & repeat imaging in 4 hours.   If no PTX on 4 hour film, will remove chest tube  Follow intermittent CXR   Acute on chronic respiratory  failure COPD, pneumonia Multifocal PNA appreciated on CXR 09/05/18-full course of abx completed.  Interval improvement per CXRs P: Budesonide + anoro  Decrease prednisone to 20mg  QD for 3 days then stop  Pulmonary hygiene - needs aggressive flutter / IS Mobilize  Rest per primary team.  Noe Gens, NP-C [facility] Pgr: [phone] or if no answer [phone] 09/19/2018, 9:04 AM

## 2018-09-19 NOTE — Progress Notes (Signed)
Progress  CXR reviewed, no pneumothorax after removing tube. OK for D/C home  Roselie Awkward, MD [facility] Pager: [phone] Cell: [phone] If no response, call [phone]

## 2018-09-19 NOTE — Clinical Social Work Note (Signed)
MD concerned that pt may need SNF at d/c. Per PT last note pt is walking 220 ft. PT will reassess. CSW continuing to follow.   [place], [place]

## 2018-09-19 NOTE — Progress Notes (Signed)
Physical Therapy Treatment Patient Details Name: [name] MRN: [id number] DOB: 12/11/1931 Today's Date: 09/19/2018    History of Present Illness Patient is a 85 y/o female who presents from PCP office with concern for PNA- reports SOB, cough for a few days. CXR- multifocal PNA. Found to have acute on chronic respiratory failure secondary to COPD and PNA. Also DKA.  L chest tube inserted 09/08/18.  PMH includes COPD, depression, HTN, DM, CAD s/p CABG. now s/p pleurodesis on 09/18/2018.     PT Comments    Patient seen today, Underwent pleurodesis on 09/18/2018. Ambulating shorter distances from prior PT visits 12/28 and 12/31 (250' down to 74' with seated rest break required). DOE 3/4, SpO2 85% on 4L. Pt reports feeling weaker overall and having a harder time ambulating. MD/RN notified. Feel she will progress to HHPT or OP PT.      Follow Up Recommendations  Supervision for mobility/OOB;Outpatient PT     Equipment Recommendations  None recommended by PT    Recommendations for Other Services       Precautions / Restrictions Precautions Precautions: Fall;Other (comment) Precaution Comments: monitor O2 sats with mobility Restrictions Weight Bearing Restrictions: No    Mobility  Bed Mobility Overal bed mobility: Modified Independent                Transfers Overall transfer level: Needs assistance Equipment used: Rolling walker (2 wheeled);1 person hand held assist Transfers: Sit to/from Stand Sit to Stand: Min guard         General transfer comment: Min guard assist for safety  Ambulation/Gait Ambulation/Gait assistance: Min guard Gait Distance (Feet): 40 Feet Assistive device: Rolling walker (2 wheeled) Gait Pattern/deviations: Step-through pattern;Shuffle;Trunk flexed Gait velocity: decreased   General Gait Details: consdierable decline from prior PT sessions, desat to 85% on 4L now, DOE 3/4 wet cough, unable to go as far or quickly, reqires seated rest  break and return to room   Stairs             Wheelchair Mobility    Modified Rankin (Stroke Patients Only)       Balance                                            Cognition Arousal/Alertness: Awake/alert Behavior During Therapy: WFL for tasks assessed/performed Overall Cognitive Status: Within Functional Limits for tasks assessed                                        Exercises      General Comments        Pertinent Vitals/Pain Pain Assessment: Faces Faces Pain Scale: Hurts little more Pain Location: chest Pain Descriptors / Indicators: Guarding Pain Intervention(s): Limited activity within patient's tolerance    Home Living                      Prior Function            PT Goals (current goals can now be found in the care plan section) Acute Rehab PT Goals PT Goal Formulation: With patient Time For Goal Achievement: 09/22/18 Potential to Achieve Goals: Good Progress towards PT goals: Progressing toward goals    Frequency    Min 3X/week      PT  Plan Current plan remains appropriate    Co-evaluation              AM-PAC PT "6 Clicks" Mobility   Outcome Measure  Help needed turning from your back to your side while in a flat bed without using bedrails?: None Help needed moving from lying on your back to sitting on the side of a flat bed without using bedrails?: A Little Help needed moving to and from a bed to a chair (including a wheelchair)?: A Little Help needed standing up from a chair using your arms (e.g., wheelchair or bedside chair)?: A Little Help needed to walk in hospital room?: A Little Help needed climbing 3-5 steps with a railing? : A Lot 6 Click Score: 18    End of Session Equipment Utilized During Treatment: Gait belt;Oxygen Activity Tolerance: Patient tolerated treatment well Patient left: in bed;Other (comment) Nurse Communication: Mobility status PT Visit Diagnosis:  Difficulty in walking, not elsewhere classified (R26.2);Unsteadiness on feet (R26.81);History of falling (Z91.81)     Time: 1420-1450 PT Time Calculation (min) (ACUTE ONLY): 30 min  Charges:  $Gait Training: 8-22 mins $Therapeutic Activity: 8-22 mins                     Reinaldo Berber, PT, DPT Acute Rehabilitation Services Pager: [phone] Office: [phone]     Reinaldo Berber 09/19/2018, 2:52 PM

## 2018-09-19 NOTE — Progress Notes (Signed)
Patient assessed to have decreased activity tolerance since chest tube accidentally extricated by patient approximately 90 minutes ago.  Crepitus noted upper left and left lateral thoracic wall.  Patient noted to have increased congestion (especially upper airway) and appears unable to release much sputum due to weak cough.  Poor inspiratory effort noted.  Uses accessory muscles of inspiration.  States breathing is making her "tired".  Respiratory therapy in to assess.

## 2018-09-19 NOTE — Progress Notes (Signed)
PROGRESS NOTE  [name] AYT:[id number] DOB: 15-Nov-1931 DOA: 09/05/2018 PCP: Harlan Stains, MD   LOS: 14 days   Brief Narrative / Interim history: 85 year old female with reduced DLCO, COPD, diabetes mellitus type 2, malnutrition, stage I lung cancer presumed stage I left lower lobe nodule with empiric radiation therapy finished in April 2019 was admitted with shortness of breath, diagnosed with COPD exacerbation with multifocal pneumonia and DKA.  Because of cough she developed spontaneous pneumothorax on 12/22, pulmonology was consulted and she had a chest tube placed.  Subjective: Complains of constipation.  No nausea no vomiting.  No fever no chills.  Pain is tolerable.  Cough is still heavy.  Breathing is heavy.  Unable to sleep last night.  Assessment & Plan: Principal Problem:   Acute on chronic respiratory failure with hypoxia (HCC) Active Problems:   Essential hypertension   S/P CABG x 1   Recurrent pneumonia   Pulmonary HTN (HCC)   COPD with acute exacerbation (HCC)   Pneumonia   Protein-calorie malnutrition, severe   Pneumothorax on left   Acute pneumothorax   Chest tube in place   Principal Problem Acute on chronic respiratory failure with hypoxia secondary to multifocal pneumonia, COPD exacerbation, acute pneumothorax in the setting of severe reduction in DLCO, stage I lung CA -Patient actually presented with shortness of breath, coughing, initial chest x-ray showed bilateral lower lobe pneumonia -Patient was started on IV antibiotics, Zithromax and IV Rocephin and she completed a course while hospitalized with antibiotics discontinued on 12/26 -Urine strep and antigen negative, blood cultures negative.  She was also started on IV Solu-Medrol, subsequently transitioned to oral prednisone -Patient developed spontaneous left pneumothorax on 12/22. Pulmonology was consulted, underwent chest tube placement.  -She appears to have recurrent pneumothorax every time  the chest tube is clamped requiring to be placed back to wall suction.  Underwent pleurodesis on 09/18/2018.  Repeat chest x-ray on 09/19/2018 shows no pneumothorax. Appreciate pulmonary assistance, plan is to perform another chest x-ray after clamping the chest tube and probable removal of the chest tube later today afternoon.  Additional Problems Type 2 diabetes mellitus, uncontrolled with hyperglycemia, DKA -CBGs worsened secondary to steroids, subsequently causing high anion gap metabolic acidosis.  Lactic acid was normal.  Patient has a history of type 2 diabetes mellitus and was recently taken off of for metformin. -Patient was placed on DKA protocol with IV fluids, IV insulin, now transitioned to subcu insulin -Continue current regimen,  Fall -Continue patient education, PT  Chronic diastolic CHF -Euvolemic, Initially Lasix and Avapro were held due to acute kidney injury.  She remains euvolemic.  Accelerated hypertension -Continue current regimen, blood pressure slightly on the high side 355 systolic but in her age group I would avoid aggressive overcorrection  Dyslipidemia -Continue Pravachol  Severe protein calorie malnutrition -Dietitian consulted, continue nutritional supplement  Hyperkalemia -Resolved  Constipation. Continue bowel regimen. X-ray abdomen.   Scheduled Meds: . albuterol  2.5 mg Nebulization TID  . amLODipine  10 mg Oral Daily  . aspirin EC  81 mg Oral QHS  . benzonatate  100 mg Oral TID  . budesonide (PULMICORT) nebulizer solution  0.5 mg Nebulization BID  . buPROPion  150 mg Oral Daily  . cholecalciferol  1,000 Units Oral QHS  . cloNIDine  0.1 mg Oral Q8H  . dextromethorphan-guaiFENesin  1 tablet Oral BID  . enoxaparin (LOVENOX) injection  30 mg Subcutaneous Q24H  . feeding supplement (GLUCERNA SHAKE)  237 mL Oral TID BM  .  ferrous sulfate  325 mg Oral BID  . hydrALAZINE  10 mg Oral Q8H  . insulin aspart  0-5 Units Subcutaneous QHS  .  insulin aspart  0-9 Units Subcutaneous TID WC  . insulin glargine  10 Units Subcutaneous Daily  . polyethylene glycol  17 g Oral Daily  . pravastatin  20 mg Oral QHS  . [START ON 09/20/2018] predniSONE  20 mg Oral Q breakfast  . senna-docusate  1 tablet Oral BID  . sertraline  100 mg Oral q morning - 10a  . umeclidinium-vilanterol  1 puff Inhalation Daily   Continuous Infusions: PRN Meds:.acetaminophen, antiseptic oral rinse, guaiFENesin, hydrALAZINE, menthol-cetylpyridinium, oxyCODONE-acetaminophen **AND** oxyCODONE, traZODone  DVT prophylaxis: Lovenox Code Status: Full code Family Communication: No family present at bedside Disposition Plan: Home when cleared by pulmonology  Consultants:   Pulmonary  Procedures:   Chest tube placement on 12/22  Antimicrobials:  Ceftriaxone/azithromycin 12/19 >>/26  Objective: Vitals:   09/18/18 2300 09/19/18 0643 09/19/18 0718 09/19/18 0741  BP: (!) 168/59 (!) 153/62  140/64  Pulse: 66   96  Resp: 14     Temp: 97.8 F (36.6 C)   98.5 F (36.9 C)  TempSrc: Oral   Oral  SpO2: 92%  97% 95%  Weight:      Height:        Intake/Output Summary (Last 24 hours) at 09/19/2018 1222 Last data filed at 09/19/2018 1937 Gross per 24 hour  Intake 120 ml  Output 40 ml  Net 80 ml   Filed Weights   09/05/18 1424  Weight: 45.4 kg    Examination:  Constitutional: NAD, calm, uncomfortable Eyes: PERRL, lids and conjunctivae normal ENMT: Mucous membranes are moist.  Neck: normal, supple Respiratory: no wheezing, bilateral crackles. Normal respiratory effort.  Cardiovascular: Regular rate and rhythm. No LE edema. 2+ pedal pulses.  Abdomen: no tenderness. Bowel sounds positive.  Skin: no rashes, small abrasion right lower flank Neurologic: non focal   Data Reviewed: I have independently reviewed following labs and imaging studies   CBC: Recent Labs  Lab 09/15/18 0146 09/16/18 0241 09/18/18 0309 09/19/18 0517  WBC 14.3* 13.5* 20.2*  26.6*  NEUTROABS  --   --   --  23.3*  HGB 10.8* 10.2* 9.7* 10.9*  HCT 35.2* 32.7* 31.0* 34.2*  MCV 91.0 91.1 92.5 92.4  PLT 318 288 282 902   Basic Metabolic Panel: Recent Labs  Lab 09/13/18 0247  09/14/18 0227 09/15/18 0146 09/16/18 0241 09/18/18 0309 09/19/18 0517  NA 137  --  140 138 138 139 137  K 5.9*   < > 3.3* 3.8 3.6 4.2 4.7  CL 105  --  103 102 105 106 104  CO2 22  --  29 25 25 26 23   GLUCOSE 145*  --  79 170* 170* 138* 141*  BUN 33*  --  24* 31* 32* 34* 43*  CREATININE 1.22*  --  1.14* 1.41* 1.04* 1.12* 1.39*  CALCIUM 8.8*  --  8.6* 8.9 9.3 9.2 10.1  MG 2.1  --  1.9  --   --   --   --   PHOS 3.1  --  3.2  --   --   --   --    < > = values in this interval not displayed.   GFR: Estimated Creatinine Clearance: 21.6 mL/min (A) (by C-G formula based on SCr of 1.39 mg/dL (H)). Liver Function Tests: No results for input(s): AST, ALT, ALKPHOS, BILITOT, PROT, ALBUMIN in the  last 168 hours. No results for input(s): LIPASE, AMYLASE in the last 168 hours. No results for input(s): AMMONIA in the last 168 hours. Coagulation Profile: No results for input(s): INR, PROTIME in the last 168 hours. Cardiac Enzymes: No results for input(s): CKTOTAL, CKMB, CKMBINDEX, TROPONINI in the last 168 hours. BNP (last 3 results) No results for input(s): PROBNP in the last 8760 hours. HbA1C: No results for input(s): HGBA1C in the last 72 hours. CBG: Recent Labs  Lab 09/18/18 1212 09/18/18 1729 09/18/18 2111 09/19/18 0739 09/19/18 1145  GLUCAP 220* 266* 236* 143* 89   Lipid Profile: No results for input(s): CHOL, HDL, LDLCALC, TRIG, CHOLHDL, LDLDIRECT in the last 72 hours. Thyroid Function Tests: No results for input(s): TSH, T4TOTAL, FREET4, T3FREE, THYROIDAB in the last 72 hours. Anemia Panel: No results for input(s): VITAMINB12, FOLATE, FERRITIN, TIBC, IRON, RETICCTPCT in the last 72 hours. Urine analysis:    Component Value Date/Time   COLORURINE YELLOW 03/02/2017 2156    APPEARANCEUR HAZY (A) 03/02/2017 2156   LABSPEC 1.014 03/02/2017 2156   PHURINE 5.0 03/02/2017 2156   GLUCOSEU NEGATIVE 03/02/2017 2156   HGBUR NEGATIVE 03/02/2017 2156   BILIRUBINUR NEGATIVE 03/02/2017 2156   [place] 03/02/2017 2156   PROTEINUR 30 (A) 03/02/2017 2156   UROBILINOGEN 0.2 07/11/2013 1416   NITRITE NEGATIVE 03/02/2017 2156   LEUKOCYTESUR SMALL (A) 03/02/2017 2156   Sepsis Labs: Invalid input(s): PROCALCITONIN, LACTICIDVEN  No results found for this or any previous visit (from the past 240 hour(s)).    Radiology Studies: Dg Chest Port 1 View  Result Date: 09/19/2018 CLINICAL DATA:  85 year old female with spontaneous pneumothorax status post COPD exacerbation with multifocal pneumonia. Left chest tube placed. EXAM: PORTABLE CHEST 1 VIEW COMPARISON:  09/18/2018 and earlier. FINDINGS: Portable AP upright view at 0606 hours. Pigtail left chest tube remains in place and appears stable. Moderate volume left chest wall and neck subcutaneous gas is stable to mildly regressed. No left pneumothorax is evident today. There is hyperlucency in both upper lungs related to emphysema. Stable patchy bibasilar opacity. No pulmonary edema. Stable cardiac size and mediastinal contours. Moderate size gastric hiatal hernia. Negative visible bowel gas pattern. IMPRESSION: 1. Stable left chest tube with no pneumothorax identified today. 2. Emphysema with continued patchy bibasilar opacity. No new cardiopulmonary abnormality. 3. Hiatal hernia. Electronically Signed   By: Genevie Ann M.D.   On: 09/19/2018 07:43   Dg Chest Port 1 View  Addendum Date: 09/18/2018   ADDENDUM REPORT: 09/18/2018 10:14 ADDENDUM: When I initially reviewed the examination with the care provider, I did not detect the subtle right apical pneumothorax. However, upon further review there is a tiny residual left apical pneumothorax. I contacted the patient's nurse to communicate this finding to the care provider. Critical  Value/emergent results were called by telephone at the time of interpretation on 09/18/2018 at 10:13 am to the patient's nurse , who verbally acknowledged these results. Electronically Signed   By: Lorriane Shire M.D.   On: 09/18/2018 10:14   Result Date: 09/18/2018 CLINICAL DATA:  Pneumothorax. EXAM: PORTABLE CHEST 1 VIEW COMPARISON:  09/17/2018 FINDINGS: Left chest tube remains in place. The tiny pneumothorax at the left base has resolved. There is a very subtle small left apical pneumothorax, diminished. Increased subcutaneous emphysema on the left. Aeration at the bases has slightly improved. Severe emphysematous changes in the upper lung zones. Heart size is normal. Chronic prominence of the pulmonary arteries. IMPRESSION: Minimal residual left apical pneumothorax. Improved aeration at the  bases. Electronically Signed: By: Lorriane Shire M.D. On: 09/18/2018 10:02   Dg Chest Port 1 View  Result Date: 09/17/2018 CLINICAL DATA:  Insertion of new left chest tube. Left pneumothorax. EXAM: PORTABLE CHEST 1 VIEW 4:15 p.m. COMPARISON:  09/17/2018 at 2:28 p.m. and CT scan of the chest dated 08/28/2018 FINDINGS: The prior chest tube has been removed and a new chest tube has been inserted at the left lung base. There is only a tiny residual left apical pneumothorax and there is also a tiny pneumothorax at the left base laterally. There is new slight subcutaneous emphysema on the left extending into the left side of the neck. There is increased accentuation of the interstitial markings at the left lung base since the prior exam. There is chronic accentuation of the interstitial markings at the right base. Severe emphysematous changes are noted in the upper lung zones. Heart size and vascularity are normal. Aortic atherosclerosis. IMPRESSION: 1. New left chest tube. Tiny residual left pneumothorax at the left apex and left base. 2. Increased interstitial markings at the left lung base since the prior exam of unknown  etiology. Electronically Signed   By: Lorriane Shire M.D.   On: 09/17/2018 16:28   Dg Chest Port 1 View  Result Date: 09/17/2018 CLINICAL DATA:  History of left pneumothorax with a chest tube in place. EXAM: PORTABLE CHEST 1 VIEW COMPARISON:  Single-view of the chest earlier today. FINDINGS: Left chest tube is in place. There is a left apical pneumothorax estimated at 20%. Bibasilar airspace disease is worse on the right and unchanged in appearance. Severe emphysema is seen. Heart size is normal. Aortic atherosclerosis is noted. IMPRESSION: Left pneumothorax estimated at 20% with a chest tube in place. No change in right greater than left basilar airspace disease. Emphysema. Atherosclerosis. Electronically Signed   By: Inge Rise M.D.   On: 09/17/2018 14:44    Author:  Berle Mull, MD [facility] Pager: [phone] 09/19/2018 12:22 PM     If 7PM-7AM, please contact night-coverage [email or web address] Password [facility] 09/19/2018, 12:22 PM

## 2018-09-20 LAB — BASIC METABOLIC PANEL
Anion gap: 6 (ref 5–15)
BUN: 43 mg/dL — ABNORMAL HIGH (ref 8–23)
CO2: 26 mmol/L (ref 22–32)
CREATININE: 1.33 mg/dL — AB (ref 0.44–1.00)
Calcium: 9.7 mg/dL (ref 8.9–10.3)
Chloride: 105 mmol/L (ref 98–111)
GFR calc Af Amer: 42 mL/min — ABNORMAL LOW (ref >60)
GFR calc non Af Amer: 37 mL/min — ABNORMAL LOW (ref >60)
Glucose, Bld: 120 mg/dL — ABNORMAL HIGH (ref 70–99)
Potassium: 5 mmol/L (ref 3.5–5.1)
Sodium: 137 mmol/L (ref 135–145)

## 2018-09-20 LAB — CBC WITH DIFFERENTIAL/PLATELET
Abs Immature Granulocytes: 0.15 10*3/uL — ABNORMAL HIGH (ref 0.00–0.07)
Basophils Absolute: 0.1 10*3/uL (ref 0.0–0.1)
Basophils Relative: 0 %
EOS ABS: 0.1 10*3/uL (ref 0.0–0.5)
Eosinophils Relative: 1 %
HCT: 28.6 % — ABNORMAL LOW (ref 36.0–46.0)
Hemoglobin: 9 g/dL — ABNORMAL LOW (ref 12.0–15.0)
Immature Granulocytes: 1 %
Lymphocytes Relative: 9 %
Lymphs Abs: 1.4 10*3/uL (ref 0.7–4.0)
MCH: 28.7 pg (ref 26.0–34.0)
MCHC: 31.5 g/dL (ref 30.0–36.0)
MCV: 91.1 fL (ref 80.0–100.0)
MONO ABS: 1.2 10*3/uL — AB (ref 0.1–1.0)
Monocytes Relative: 8 %
Neutro Abs: 13.1 10*3/uL — ABNORMAL HIGH (ref 1.7–7.7)
Neutrophils Relative %: 81 %
Platelets: 189 10*3/uL (ref 150–400)
RBC: 3.14 MIL/uL — ABNORMAL LOW (ref 3.87–5.11)
RDW: 15.2 % (ref 11.5–15.5)
WBC: 16.1 10*3/uL — AB (ref 4.0–10.5)
nRBC: 0 % (ref 0.0–0.2)

## 2018-09-20 LAB — GLUCOSE, CAPILLARY
Glucose-Capillary: 113 mg/dL — ABNORMAL HIGH (ref 70–99)
Glucose-Capillary: 141 mg/dL — ABNORMAL HIGH (ref 70–99)

## 2018-09-20 MED ORDER — LACTULOSE 10 GM/15ML PO SOLN
20.0000 g | Freq: Every day | ORAL | Status: DC
  Administered 2018-09-20: 20 g via ORAL
  Filled 2018-09-20: qty 30

## 2018-09-20 MED ORDER — DM-GUAIFENESIN ER 30-600 MG PO TB12: 1.0000 | ORAL_TABLET | Freq: Two times a day (BID) | ORAL | 0 refills | Status: DC

## 2018-09-20 MED ORDER — PREDNISONE 10 MG PO TABS: ORAL_TABLET | ORAL | 0 refills | Status: DC

## 2018-09-20 MED ORDER — MENTHOL 3 MG MT LOZG: 1.0000 | LOZENGE | OROMUCOSAL | 12 refills | Status: DC | PRN

## 2018-09-20 MED ORDER — POLYETHYLENE GLYCOL 3350 17 G PO PACK: 17.0000 g | PACK | Freq: Every day | ORAL | 0 refills | Status: DC

## 2018-09-20 MED ORDER — GUAIFENESIN 100 MG/5ML PO SOLN: 5.0000 mL | ORAL | 0 refills | Status: DC | PRN

## 2018-09-20 MED ORDER — FUROSEMIDE 20 MG PO TABS: 20.0000 mg | ORAL_TABLET | ORAL | 0 refills | Status: DC

## 2018-09-20 MED ORDER — HYDROCODONE-ACETAMINOPHEN 5-325 MG PO TABS: 1.0000 | ORAL_TABLET | Freq: Four times a day (QID) | ORAL | 0 refills | Status: AC | PRN

## 2018-09-20 MED ORDER — BENZONATATE 100 MG PO CAPS: 100.0000 mg | ORAL_CAPSULE | Freq: Three times a day (TID) | ORAL | 0 refills | Status: DC

## 2018-09-20 MED ORDER — GLUCERNA SHAKE PO LIQD: 237.0000 mL | Freq: Three times a day (TID) | ORAL | 0 refills | Status: AC

## 2018-09-20 MED ORDER — SENNOSIDES-DOCUSATE SODIUM 8.6-50 MG PO TABS: 1.0000 | ORAL_TABLET | Freq: Two times a day (BID) | ORAL | 0 refills | Status: DC

## 2018-09-20 NOTE — Care Management Note (Signed)
Case Management Note  Patient Details  Name: [name] MRN: [id number] Date of Birth: 10-22-1931  Subjective/Objective:     For dc today, she has home oxygen 2 liters at home.  She will need HHPT, [place], HHRN,  NCM offered choice, she chose [facility], referral given to [facility] with [facility], soc will begin 24-48hrs post dc. She has a rolling walker at home already.  Husband will provide transport home and he will bring her oxygen.               Action/Plan: DC when ready.  Expected Discharge Date:                  Expected Discharge Plan:  [place]  In-House Referral:     Discharge planning Services  CM Consult  Post Acute Care Choice:  Home Health Choice offered to:  Patient  DME Arranged:    DME Agency:     HH Arranged:  RN, Disease Management, PT, OT [place] Agency:  [place]  Status of Service:  Completed, signed off  If discussed at [place] of Stay Meetings, dates discussed:    Additional Comments:  Zenon Mayo, RN 09/20/2018, 1:45 PM

## 2018-09-20 NOTE — Progress Notes (Signed)
SATURATION QUALIFICATIONS: (This note is used to comply with regulatory documentation for home oxygen)  Patient Saturations on Room Air at Rest = 91%  Patient Saturations on Room Air while Ambulating = 81%  Patient Saturations on 4 Liters of oxygen while Ambulating = 91%  Please briefly explain why patient needs home oxygen:Pt needs to be on oxygen with any activity.

## 2018-09-23 NOTE — Discharge Summary (Signed)
[facility] Discharge Summary   Patient: [name]:[id number]   PCP: Harlan Stains, MD DOB: [birth year]   Date of admission: 09/05/2018   Date of discharge: 09/20/2018    Discharge Diagnoses:   Principal Problem:   Acute on chronic respiratory failure with hypoxia ([place]) Active Problems:   Essential hypertension   S/P CABG x 1   Recurrent pneumonia   Pulmonary HTN ([place])   COPD with acute exacerbation ([place])   Community acquired pneumonia of left lower lobe of lung ([place])   Protein-calorie malnutrition, severe   Pneumothorax on left   Acute pneumothorax   Chest tube in place   Admitted From: Home Disposition: Home with home health  Recommendations for Outpatient Follow-up:  1. Please follow-up with PCP in 1 week, pulmonology as recommended  Follow-up [place] Follow up.   Specialty:  Home Health Services Why:  [facility], HHPT, [place] Contact information: [address] [place] [address] [phone]        Harlan Stains, MD. Schedule an appointment as soon as possible for a visit in 1 week(s).   Specialty:  Family Medicine Contact information: [place] [address] [phone]        Brand Males, MD. Call in 1 week(s).   Specialty:  Pulmonary Disease Why:  for appointment in 2 weeks.  Contact information: [address] [place] [address] [phone]          Diet recommendation: Cardiac diet  Activity: The patient is advised to gradually reintroduce usual activities.  Discharge Condition: good  Code Status: Full code  History of present illness: As per the H and P dictated on admission, "NIJAH TEJERA is a 85 y.o. female with medical history significant of COPD being followed by pulmonology on chronic oxygen at 2 L/h at home, hypertension, diabetes, aortic stenosis, hyperlipidemia, irritable bowel syndrome who presents to the ER with worsening  shortness of breath and cough. She reported some fever and mild chills.  Patient also has had problem with her home oxygen which was normally at 2 L but now she has developed to 4 L.  She is still on 4 L in the ER.  She is used her nebulizer and other treatments at home with no results so she came to the emergency room.  Denied any chest pain.  She is denied any PND orthopnea.  Denied any sick contacts.  She is having yellow sputum.  Patient was seen in the ER and now found to have left lower lobe pneumonia.  She additionally has hypokalemia among other things.  ED Course: Temperature is 99.2 with blood pressure 183/75 pulse 78 respirate of 27 and oxygen sat 89% on 2 L."  Hospital Course:  Summary of her active problems in the hospital is as following. Acute on chronic respiratory failure with hypoxia secondary to multifocal pneumonia, COPD exacerbation, acute pneumothorax in the setting of severe reduction in DLCO, stage I lung CA -Patient actually presented with shortness of breath, coughing, initial chest x-ray showed bilateral lower lobe pneumonia -Patient was started on IV antibiotics, Zithromax and IV Rocephin and she completed a course while hospitalized with antibiotics discontinued on 12/26 -Urine strep and antigen negative, blood cultures negative. She was also started on IV Solu-Medrol, subsequently transitioned to oral prednisone -Patient developed spontaneous left pneumothorax on 12/22. Pulmonology was consulted, underwent chest tube placement.  -She appears to have recurrent pneumothorax every time  the chest tube is clamped requiring to be placed back to wall suction.  Underwent pleurodesis on 09/18/2018.  Repeat chest x-ray on 09/19/2018 shows no pneumothorax. Appreciate pulmonary assistance, chest tube was successfully removed without incidence.  Repeat chest x-ray was negative. Outpatient follow-up with pulmonary recommended.  Type 2 diabetes mellitus, uncontrolled with  hyperglycemia, DKA -CBGs worsened secondary to steroids, subsequently causing high anion gap metabolic acidosis. Lactic acid was normal. Patient has a history of type 2 diabetes mellitus and was recently taken off of for metformin. -Patient was placed on DKA protocol with IV fluids, IV insulin, now transitioned to subcu insulin  Fall -patient education, PT  Chronic diastolic CHF -Euvolemic, Initially Lasix and Avapro were held due to acute kidney injury.  She remains euvolemic.  Accelerated hypertension -Continue current regimen,   but in her age group I would avoid aggressive overcorrection  Dyslipidemia -Continue Pravachol  Severe protein calorie malnutrition -Dietitian consulted, continue nutritional supplement  Hyperkalemia -Resolved  Constipation. Continue bowel regimen. X-ray abdomen shows no obstruction.   Pain control  - [employer] Controlled Substance Reporting System database was reviewed. - Last prescription for controlled substance was on February 2018 Hydromet syrup. -5 day supply was provided of Norco - Patient was instructed, not to drive, operate heavy machinery, perform activities at heights, swimming or participation in water activities or provide baby sitting services while on Pain, Sleep and Anxiety Medications; until her outpatient Physician has advised to do so again.  - Also recommended to not to take more than prescribed Pain, Sleep and Anxiety Medications.  Patient was seen by physical therapy, who recommended home health, which was arranged by case manager. On the day of the discharge the patient's vitals were stable, and no other acute medical condition were reported by patient. the patient was felt safe to be discharge at home with home health.  Consultants: PCCM  Procedures: Chest tube insertion  DISCHARGE MEDICATION: Allergies as of 09/20/2018      Reactions   Lipitor [atorvastatin] Other (See Comments)   myalgias myalgias    Lisinopril Other (See Comments)   cough cough   Amaryl [glimepiride]    Side effects   Anoro Ellipta [umeclidinium-vilanterol]    Increase BP   Avelox [moxifloxacin Hcl In Nacl]    nausea   Ciprofloxacin    nausea   Other Other (See Comments)   Increased cough   Prevacid [lansoprazole]    diarrhea   Spiriva Handihaler [tiotropium Bromide Monohydrate]    Increased cough   Symbicort [budesonide-formoterol Fumarate] Cough   Increased cough      Medication List    STOP taking these medications   guaiFENesin 600 MG 12 hr tablet Commonly known as:  MUCINEX Replaced by:  guaiFENesin 100 MG/5ML Soln     TAKE these medications   acetaminophen 500 MG tablet Commonly known as:  TYLENOL Take 500 mg by mouth every 6 (six) hours as needed for headache.   alendronate 70 MG tablet Commonly known as:  FOSAMAX Take 70 mg by mouth once a week. Take with a full glass of water on an empty stomach.   amLODipine 10 MG tablet Commonly known as:  NORVASC Take 1 tablet (10 mg total) by mouth daily.   ANORO ELLIPTA 62.5-25 MCG/INH Aepb Generic drug:  umeclidinium-vilanterol USE 1 INHALATION BY MOUTH  DAILY   aspirin EC 81 MG tablet Take 81 mg by mouth at bedtime.   benzonatate 100 MG capsule Commonly known as:  TESSALON Take 1  capsule (100 mg total) by mouth 3 (three) times daily.   buPROPion 150 MG 24 hr tablet Commonly known as:  WELLBUTRIN XL Take 150 mg by mouth daily.   cholecalciferol 25 MCG (1000 UT) tablet Commonly known as:  VITAMIN D Take 1,000 Units by mouth at bedtime.   cloNIDine 0.1 MG tablet Commonly known as:  CATAPRES Take 0.1 mg by mouth 2 (two) times daily.   COLACE PO Take 100 mg by mouth 2 (two) times daily.   dextromethorphan-guaiFENesin 30-600 MG 12hr tablet Commonly known as:  MUCINEX DM Take 1 tablet by mouth 2 (two) times daily.   feeding supplement (GLUCERNA SHAKE) Liqd Take 237 mLs by mouth 3 (three) times daily between meals.   fluticasone  220 MCG/ACT inhaler Commonly known as:  FLOVENT HFA Inhale 2 puffs into the lungs 2 (two) times daily.   furosemide 20 MG tablet Commonly known as:  LASIX Take 1 tablet (20 mg total) by mouth once a week. What changed:    medication strength  when to take this   guaiFENesin 100 MG/5ML Soln Commonly known as:  ROBITUSSIN Take 5 mLs (100 mg total) by mouth every 4 (four) hours as needed for cough or to loosen phlegm. Replaces:  guaiFENesin 600 MG 12 hr tablet   HYDROcodone-acetaminophen 5-325 MG tablet Commonly known as:  NORCO Take 1 tablet by mouth every 6 (six) hours as needed for up to 5 days for moderate pain.   irbesartan 150 MG tablet Commonly known as:  AVAPRO Take 1 tablet (150 mg total) by mouth daily.   Iron 325 (65 Fe) MG Tabs Take 325 mg by mouth 2 (two) times daily.   Magnesium 250 MG Tabs Take 250 mg by mouth at bedtime.   menthol-cetylpyridinium 3 MG lozenge Commonly known as:  CEPACOL Take 1 lozenge (3 mg total) by mouth as needed for sore throat.   OCUVITE EYE HEALTH FORMULA PO Take 1 capsule by mouth daily.   polyethylene glycol packet Commonly known as:  MIRALAX / GLYCOLAX Take 17 g by mouth daily.   pravastatin 40 MG tablet Commonly known as:  PRAVACHOL Take 40 mg by mouth every evening.   predniSONE 10 MG tablet Commonly known as:  DELTASONE Take 20mg  daily for 3days,Take 10mg  daily for 3days, then stop What changed:  additional instructions   PROAIR HFA 108 (90 Base) MCG/ACT inhaler Generic drug:  albuterol Inhale 2 puffs into the lungs 4 (four) times daily as needed for wheezing or shortness of breath.   albuterol (2.5 MG/3ML) 0.083% nebulizer solution Commonly known as:  PROVENTIL Take 3 mLs (2.5 mg total) by nebulization every 6 (six) hours as needed for wheezing.   senna-docusate 8.6-50 MG tablet Commonly known as:  Senokot-S Take 1 tablet by mouth 2 (two) times daily.   sertraline 100 MG tablet Commonly known as:  ZOLOFT Take  1 tablet (100 mg total) by mouth every morning.   Tiotropium Bromide-Olodaterol 2.5-2.5 MCG/ACT Aers Commonly known as:  STIOLTO RESPIMAT Inhale 2 puffs into the lungs daily.      Allergies  Allergen Reactions  . Lipitor [Atorvastatin] Other (See Comments)    myalgias myalgias  . Lisinopril Other (See Comments)    cough cough  . Amaryl [Glimepiride]     Side effects  . Anoro Ellipta [Umeclidinium-Vilanterol]     Increase BP  . Avelox [Moxifloxacin Hcl In Nacl]     nausea  . Ciprofloxacin     nausea  . Other Other (See  Comments)    Increased cough  . Prevacid [Lansoprazole]     diarrhea  . Spiriva Handihaler [Tiotropium Bromide Monohydrate]     Increased cough  . Symbicort [Budesonide-Formoterol Fumarate] Cough    Increased cough   Discharge Instructions    Diet - low sodium heart healthy   Complete by:  As directed    Discharge instructions   Complete by:  As directed    It is important that you read following instructions as well as go over your medication list with RN to help you understand your care after this hospitalization.  Discharge Instructions: Please follow-up with PCP in one week  Please request your primary care physician to go over all Hospital Tests and Procedure/Radiological results at the follow up,  Please get all Hospital records sent to your PCP by signing hospital release before you go home.   Do not take more than prescribed Pain, Sleep and Anxiety Medications. You were cared for by a hospitalist during your hospital stay. If you have any questions about your discharge medications or the care you received while you were in the hospital after you are discharged, you can call the unit you were admitted to and ask to speak with the hospitalist on call if the hospitalist that took care of you is not available.  Once you are discharged, your primary care physician will handle any further medical issues. Please note that NO REFILLS for any discharge  medications will be authorized once you are discharged, as it is imperative that you return to your primary care physician (or establish a relationship with a primary care physician if you do not have one) for your aftercare needs so that they can reassess your need for medications and monitor your lab values. You Must read complete instructions/literature along with all the possible adverse reactions/side effects for all the Medicines you take and that have been prescribed to you. Take any new Medicines after you have completely understood and accept all the possible adverse reactions/side effects. Wear Seat belts while driving. If you have smoked or chewed Tobacco in the last 2 yrs please stop smoking and/or stop any Recreational drug use.   Increase activity slowly   Complete by:  As directed      Discharge Exam: Filed Weights   09/05/18 1424  Weight: 45.4 kg   Vitals:   09/20/18 0800 09/20/18 1506  BP:    Pulse:    Resp:    Temp:    SpO2: 95% 92%   General: Appear in mild distress, no Rash; Oral Mucosa moist Cardiovascular: S1 and S2 Present, no Murmur, no JVD Respiratory: Bilateral Air entry present and upper airway Crackles, no wheezes Abdomen: Bowel Sound present, Soft and no tenderness Extremities: no Pedal edema, no calf tenderness Neurology: Grossly no focal neuro deficit.  The results of significant diagnostics from this hospitalization (including imaging, microbiology, ancillary and laboratory) are listed below for reference.    Significant Diagnostic Studies: Dg Chest 2 View  Result Date: 09/05/2018 CLINICAL DATA:  Productive cough and shortness of breath for the past 2-3 days. Clinical stage I non-small cell lung cancer in the left lower lobe. EXAM: CHEST - 2 VIEW COMPARISON:  Chest radiographs dated 07/01/2018. Chest CT dated 08/28/2018. FINDINGS: Normal sized heart. Significantly increase left lower airspace opacity. Small left pleural effusion, increased. Stable  hyperexpansion of lungs and mild patchy opacity at the right lung base. Stable mild right pleural thickening. Mild thoracic spine degenerative changes. IMPRESSION: 1. Significantly  increased left lower lobe pneumonia. 2. Small left pleural effusion, increased. 3. Stable mild pneumonia or postradiation changes at the right lung base. 4. Stable mild changes of COPD. Electronically Signed   By: Claudie Revering M.D.   On: 09/05/2018 16:15   Ct Chest Wo Contrast  Result Date: 08/28/2018 CLINICAL DATA:  Presumptive clinical stage I non-small cell lung cancer of the left lower lung. EXAM: CT CHEST WITHOUT CONTRAST TECHNIQUE: Multidetector CT imaging of the chest was performed following the standard protocol without IV contrast. COMPARISON:  02/12/2018 FINDINGS: Cardiovascular: The heart size is normal. No substantial pericardial effusion. Coronary artery calcification is evident. Atherosclerotic calcification is noted in the wall of the thoracic aorta. Patient is status post CABG. Right main pulmonary artery measures 3.1 cm diameter. Mediastinum/Nodes: No mediastinal lymphadenopathy. No evidence for gross hilar lymphadenopathy although assessment is limited by the lack of intravenous contrast on today's study. The esophagus has normal imaging features. Moderate hiatal hernia noted. There is no axillary lymphadenopathy. Lungs/Pleura: The central tracheobronchial airways are patent. Moderate to advanced emphysema with bullous change in the upper lobes. 15 x 11 mm peripheral left lower lobe nodule seen previously has become incorporated into a bandlike area of interstitial and airspace opacity in the peripheral left lower lobe, suggesting evolving radiation fibrosis. There is a nodular component today measuring 15 x 20 mm on 118/7. Trace left pleural effusion. Upper Abdomen: Unremarkable. Musculoskeletal: No worrisome lytic or sclerotic osseous abnormality. T12 superior endplate compression fracture is similar to prior.  IMPRESSION: 1. Interval increase in bandlike opacity in the peripheral left lower lobe, incorporating the irregular nodule seen on the prior study. Imaging features most likely reflect changes from radiation therapy. 2. Prominence of the pulmonary arteries raises the question of pulmonary arterial hypertension. 3. Moderate to advanced Emphysema. (ICD10-J43.9) 4.  Aortic Atherosclerois (ICD10-170.0) 5. Moderate hiatal hernia. Electronically Signed   By: Misty Stanley M.D.   On: 08/28/2018 15:39   Dg Chest Port 1 View  Result Date: 09/19/2018 CLINICAL DATA:  Chest tube removal. Short of breath. Pneumothorax follow up. EXAM: PORTABLE CHEST 1 VIEW COMPARISON:  09/19/2018 at 6:06 a.m. FINDINGS: Since the earlier exam, the left chest tube has been removed. There is no convincing pneumothorax. Subcutaneous emphysema along the anterolateral chest wall and left neck base is unchanged. There is persistent opacity at both lung bases consistent with atelectasis, pneumonia or a combination. IMPRESSION: 1. No evidence of a pneumothorax following left-sided chest tube removal. No change in the appearance of the lungs since the earlier exam. Electronically Signed   By: Lajean Manes M.D.   On: 09/19/2018 13:08   Dg Chest Port 1 View  Result Date: 09/19/2018 CLINICAL DATA:  85 year old female with spontaneous pneumothorax status post COPD exacerbation with multifocal pneumonia. Left chest tube placed. EXAM: PORTABLE CHEST 1 VIEW COMPARISON:  09/18/2018 and earlier. FINDINGS: Portable AP upright view at 0606 hours. Pigtail left chest tube remains in place and appears stable. Moderate volume left chest wall and neck subcutaneous gas is stable to mildly regressed. No left pneumothorax is evident today. There is hyperlucency in both upper lungs related to emphysema. Stable patchy bibasilar opacity. No pulmonary edema. Stable cardiac size and mediastinal contours. Moderate size gastric hiatal hernia. Negative visible bowel gas  pattern. IMPRESSION: 1. Stable left chest tube with no pneumothorax identified today. 2. Emphysema with continued patchy bibasilar opacity. No new cardiopulmonary abnormality. 3. Hiatal hernia. Electronically Signed   By: Genevie Ann M.D.   On: 09/19/2018  07:43   Dg Chest Port 1 View  Addendum Date: 09/18/2018   ADDENDUM REPORT: 09/18/2018 10:14 ADDENDUM: When I initially reviewed the examination with the care provider, I did not detect the subtle right apical pneumothorax. However, upon further review there is a tiny residual left apical pneumothorax. I contacted the patient's nurse to communicate this finding to the care provider. Critical Value/emergent results were called by telephone at the time of interpretation on 09/18/2018 at 10:13 am to the patient's nurse , who verbally acknowledged these results. Electronically Signed   By: Lorriane Shire M.D.   On: 09/18/2018 10:14   Result Date: 09/18/2018 CLINICAL DATA:  Pneumothorax. EXAM: PORTABLE CHEST 1 VIEW COMPARISON:  09/17/2018 FINDINGS: Left chest tube remains in place. The tiny pneumothorax at the left base has resolved. There is a very subtle small left apical pneumothorax, diminished. Increased subcutaneous emphysema on the left. Aeration at the bases has slightly improved. Severe emphysematous changes in the upper lung zones. Heart size is normal. Chronic prominence of the pulmonary arteries. IMPRESSION: Minimal residual left apical pneumothorax. Improved aeration at the bases. Electronically Signed: By: Lorriane Shire M.D. On: 09/18/2018 10:02   Dg Chest Port 1 View  Result Date: 09/17/2018 CLINICAL DATA:  Insertion of new left chest tube. Left pneumothorax. EXAM: PORTABLE CHEST 1 VIEW 4:15 p.m. COMPARISON:  09/17/2018 at 2:28 p.m. and CT scan of the chest dated 08/28/2018 FINDINGS: The prior chest tube has been removed and a new chest tube has been inserted at the left lung base. There is only a tiny residual left apical pneumothorax and there is  also a tiny pneumothorax at the left base laterally. There is new slight subcutaneous emphysema on the left extending into the left side of the neck. There is increased accentuation of the interstitial markings at the left lung base since the prior exam. There is chronic accentuation of the interstitial markings at the right base. Severe emphysematous changes are noted in the upper lung zones. Heart size and vascularity are normal. Aortic atherosclerosis. IMPRESSION: 1. New left chest tube. Tiny residual left pneumothorax at the left apex and left base. 2. Increased interstitial markings at the left lung base since the prior exam of unknown etiology. Electronically Signed   By: Lorriane Shire M.D.   On: 09/17/2018 16:28   Dg Chest Port 1 View  Result Date: 09/17/2018 CLINICAL DATA:  History of left pneumothorax with a chest tube in place. EXAM: PORTABLE CHEST 1 VIEW COMPARISON:  Single-view of the chest earlier today. FINDINGS: Left chest tube is in place. There is a left apical pneumothorax estimated at 20%. Bibasilar airspace disease is worse on the right and unchanged in appearance. Severe emphysema is seen. Heart size is normal. Aortic atherosclerosis is noted. IMPRESSION: Left pneumothorax estimated at 20% with a chest tube in place. No change in right greater than left basilar airspace disease. Emphysema. Atherosclerosis. Electronically Signed   By: Inge Rise M.D.   On: 09/17/2018 14:44   Dg Chest Port 1 View  Result Date: 09/17/2018 CLINICAL DATA:  Chest tube.  Shortness of breath.  Sore chest. EXAM: PORTABLE CHEST 1 VIEW COMPARISON:  09/15/2018. FINDINGS: Left chest tube in stable position. Again no definite pneumothorax noted. Prior cardiac valve replacement. Heart size normal. Bibasilar atelectasis/infiltrates and small pleural effusions again noted. IMPRESSION: 1. Left chest tube in stable position. Again no definite pneumothorax identified. 2.  Prior cardiac valve replacement.  Heart  size stable. 3. Stable bibasilar atelectasis/infiltrates and small bilateral  pleural effusions. Electronically Signed   By: Marcello Moores  Register   On: 09/17/2018 06:57   Dg Chest Port 1 View  Result Date: 09/15/2018 CLINICAL DATA:  History of pneumothorax. EXAM: PORTABLE CHEST 1 VIEW COMPARISON:  Chest radiograph 09/15/2018 FINDINGS: Monitoring leads overlie the patient. Stable cardiomegaly. Aortic atherosclerosis. Similar-appearing mid and lower lung heterogeneous opacities. Small bilateral pleural effusions. Left chest tube stable in position. No definite left-sided pneumothorax identified on current exam. No right-sided pneumothorax identified. Thoracic spine degenerative changes. IMPRESSION: Left chest tube remains in position. No definite left-sided pneumothorax identified. No definite right-sided pneumothorax identified. Similar-appearing bibasilar consolidation and small effusions. Electronically Signed   By: Lovey Newcomer M.D.   On: 09/15/2018 12:50   Dg Chest Port 1 View  Result Date: 09/15/2018 CLINICAL DATA:  85 year old female with history of left-sided pneumothorax. No current chest pain. EXAM: PORTABLE CHEST 1 VIEW COMPARISON:  Chest x-ray 09/14/2018. FINDINGS: Left-sided chest tube remains in position with pigtail projecting over the lateral aspect of the left hemithorax. Small residual left-sided pneumothorax noted. Small amount of left-sided pleural effusion. Small right pleural effusion. Bibasilar opacities which may reflect areas of atelectasis and/or airspace consolidation. Large hiatal hernia. No evidence of pulmonary edema. Probable skin fold artifact projecting over the right hemithorax. Heart size is normal. Upper mediastinal contours are within normal limits. Aortic atherosclerosis. Status post median sternotomy for aortic valve replacement with a bioprosthetic aortic valve. IMPRESSION: 1. Left-sided chest tube appears stable in position. There is an apparent recurrence of a small left  pneumothorax. In addition, there is what appears to be a skin fold artifact projecting over the right hemithorax. The possibility of left-sided skinfold artifact is not excluded, but not favored. Repeat chest radiograph (with efforts to mitigate potential skinfold artifact) is recommended to better evaluate these findings. 2. Persistent areas of atelectasis and/or consolidation in the lung bases bilaterally with small bilateral pleural effusions. 3. Large hiatal hernia. Electronically Signed   By: Vinnie Langton M.D.   On: 09/15/2018 08:54   Dg Chest Port 1 View  Result Date: 09/14/2018 CLINICAL DATA:  Pneumothorax, chest tube adjustment. EXAM: PORTABLE CHEST 1 VIEW COMPARISON:  September 14, 2018 12:39 p.m. FINDINGS: The heart size and mediastinal contours are stable. Left chest tube is unchanged. There is small left pleural effusion. There is no pleural line in the left hemithorax to suggest pneumothorax. Minimal right pleural effusion is noted. The visualized skeletal structures are stable. IMPRESSION: Left chest tube is identified in the lateral left mid to lower hemithorax unchanged compared prior exam. Small left pleural effusion. No definite pleural line is noted to suggest pneumothorax. Electronically Signed   By: Abelardo Diesel M.D.   On: 09/14/2018 22:33   Dg Chest Port 1 View  Result Date: 09/14/2018 CLINICAL DATA:  Left-sided pneumothorax EXAM: PORTABLE CHEST 1 VIEW COMPARISON:  09/06/2018 at 0517 hours FINDINGS: Left-sided chest tube remains in place along the periphery of the left mid lung. No recurrence or visible left-sided pneumothorax is identified. There is bibasilar atelectasis with minimal blunting of the left lateral costophrenic angle presumably a small left effusion unchanged in appearance. No mediastinal shift. Median sternotomy sutures are in place with evidence of prior aortic valvular replacement and post CABG change. Heart size is normal with probable small hiatal hernia  superimposed. IMPRESSION: Stable left-sided chest tube without recurrence or visible pneumothorax. Electronically Signed   By: Ashley Royalty M.D.   On: 09/14/2018 14:32   Dg Chest Port 1 View  Result Date:  09/14/2018 CLINICAL DATA:  Follow-up pneumothorax EXAM: PORTABLE CHEST 1 VIEW COMPARISON:  Second film from yesterday FINDINGS: Left apical pneumothorax on prior is no longer seen. Skin fold seen at the lateral bases on both sides. Small left pleural effusion. Atelectasis at both bases. Normal heart size. Prior aortic valve replacement. IMPRESSION: Apical pneumothorax on prior is no longer seen. Otherwise stable. Electronically Signed   By: Monte Fantasia M.D.   On: 09/14/2018 07:46   Dg Chest Port 1 View  Result Date: 09/13/2018 CLINICAL DATA:  Follow-up LEFT pneumothorax. Small caliber LEFT chest tube in place but recently clamped. Current history of LEFT LOWER LOBE lung cancer treated with radiation therapy. EXAM: PORTABLE CHEST 1 VIEW 1:08 p.m.: COMPARISON:  Chest x-ray earlier same day 7:22 a.m., yesterday and previously. FINDINGS: The RIGHT apical pneumothorax has increased slightly since the examination earlier today and yesterday, still on the order of 5% or so. Small caliber LEFT chest tube remains in place. Bullous emphysematous changes in the UPPER lobes, LEFT greater than RIGHT as noted previously. Pleuroparenchymal scarring in the bases, LEFT greater than RIGHT. No new pulmonary parenchymal abnormalities. Prior sternotomy for aortic valve replacement and CABG. Cardiac silhouette upper normal in size, unchanged. Thoracic aorta atherosclerotic, unchanged. Prominent central pulmonary arteries, unchanged. IMPRESSION: 1. Slight interval increase in the RIGHT apical pneumothorax since the examination earlier today, still on the order of 5% or so, with small caliber LEFT chest tube in place. 2. Stable pleuroparenchymal scarring in the bases, LEFT greater than RIGHT. 3.  Emphysema. (ICD10-J43.9) 4.  No new abnormalities. Electronically Signed   By: Evangeline Dakin M.D.   On: 09/13/2018 13:38   Dg Chest Port 1 View  Result Date: 09/13/2018 CLINICAL DATA:  Shortness of breath with pneumothorax EXAM: PORTABLE CHEST 1 VIEW COMPARISON:  Yesterday FINDINGS: When accounting for apical bullous emphysema there is no convincing residual pneumothorax. Left chest tube is seen in stable position. Stable costophrenic sulcus blunting. Normal heart size. Aortic valve replacement. Moderate hiatal hernia. IMPRESSION: No visible residual pneumothorax. Electronically Signed   By: Monte Fantasia M.D.   On: 09/13/2018 07:55   Dg Chest Port 1 View  Result Date: 09/12/2018 CLINICAL DATA:  Pneumothorax EXAM: PORTABLE CHEST 1 VIEW COMPARISON:  Portable exam 1703 hours compared to 09/11/2018 FINDINGS: Pigtail LEFT thoracostomy tube again seen, unchanged. Upper normal heart size post AVR. Atherosclerotic calcification aorta. Emphysematous and bronchitic changes consistent with COPD. Blunting of the costophrenic angles bilaterally by small pleural effusions with associated mild bibasilar atelectasis. Small LEFT apex pneumothorax. Remaining lungs clear. Skin fold projects over RIGHT chest. Bones demineralized. IMPRESSION: Minimal residual LEFT apex pneumothorax. COPD changes with bibasilar atelectasis and small effusions. Electronically Signed   By: Lavonia Dana M.D.   On: 09/12/2018 17:26   Dg Chest Port 1 View  Result Date: 09/11/2018 CLINICAL DATA:  Left-sided pneumothorax EXAM: PORTABLE CHEST 1 VIEW COMPARISON:  Prior exams dating back through 09/09/2018. FINDINGS: Further decrease in size of tiny left apical pneumothorax, now barely visible. Left-sided chest tube is in place. Chronic stable blunting the left lateral costophrenic angle presumably from small left effusion with adjacent atelectasis. Improved aeration at the left lung base. Heart and mediastinal contours are stable with moderate aortic atherosclerosis. No  aneurysm. Median sternotomy sutures are place with aortic valvular replacement and post CABG change. IMPRESSION: Further decrease in size of tiny left apical pneumothorax. Left-sided chest tube is in place. Improved aeration at the left lung base with unchanged presumed small left effusion.  Electronically Signed   By: Ashley Royalty M.D.   On: 09/11/2018 17:42   Dg Chest Port 1 View  Result Date: 09/10/2018 CLINICAL DATA:  Followup left pneumothorax and chest tube. EXAM: PORTABLE CHEST 1 VIEW COMPARISON:  Yesterday. FINDINGS: Skin folds are demonstrated on the left. Significant reduction in the left pneumothorax with a less than 5% apical pneumothorax remaining. A small caliber left chest tube remains in place. No significant change in a small left pleural effusion and bibasilar atelectasis or pneumonia. Mild-to-moderate peribronchial thickening is unchanged. Diffuse osteopenia. IMPRESSION: 1. Significant reduction in the left pneumothorax with a less than 5% apical pneumothorax remaining. 2. Stable small left pleural effusion and bibasilar atelectasis or pneumonia. 3. Stable mild to moderate bronchitic changes. Electronically Signed   By: Claudie Revering M.D.   On: 09/10/2018 08:24   Dg Chest Port 1 View  Result Date: 09/09/2018 CLINICAL DATA:  Chest tube, pneumothorax EXAM: PORTABLE CHEST 1 VIEW COMPARISON:  09/08/2018 FINDINGS: Sternotomy wires overlie normal cardiac silhouette. Persistent small LEFT pneumothorax laterally measures 11 mm from the chest wall. Pneumothorax appears decreased in volume compared to 1 day prior. Pigtail catheter along the LEFT lateral chest wall. Small LEFT effusion. RIGHT lung clear IMPRESSION: 1. Decrease in volume of small LEFT pneumothorax with chest tube in place 2. Small LEFT effusion. Electronically Signed   By: Suzy Bouchard M.D.   On: 09/09/2018 07:55   Dg Chest Port 1 View  Result Date: 09/08/2018 CLINICAL DATA:  Pneumothorax, chest tube EXAM: PORTABLE CHEST 1  VIEW COMPARISON:  Portable exam 1512 hours compared to 0909 hours FINDINGS: New pigtail LEFT thoracostomy tube. Persistent moderate LEFT pneumothorax estimated 20% though decreased from previous exam. Persistent LEFT pleural effusion. Underlying emphysematous and bronchitic changes consistent with COPD. Skin fold projects over LEFT chest as well. Persistent atelectasis versus infiltrate at RIGHT base. Normal heart size post AVR. Atherosclerotic calcifications aorta. IMPRESSION: Moderate LEFT pneumothorax decreased since pigtail thoracostomy tube placement. COPD changes with LEFT pleural effusion and RIGHT basilar atelectasis versus infiltrate. Electronically Signed   By: Lavonia Dana M.D.   On: 09/08/2018 15:30   Dg Chest Port 1 View  Addendum Date: 09/08/2018   ADDENDUM REPORT: 09/08/2018 10:28 ADDENDUM: Critical Value/emergent results were called by telephone at the time of interpretation on 09/08/2018 at 9:58 a.m. to Dr. Berle Mull , who verbally acknowledged these results. Electronically Signed   By: Marin Olp M.D.   On: 09/08/2018 10:28   Result Date: 09/08/2018 CLINICAL DATA:  Shortness of breath getting better. EXAM: PORTABLE CHEST 1 VIEW COMPARISON:  09/06/2018 FINDINGS: Lungs are adequately inflated with emphysematous disease. There is a new moderate left pneumothorax worse over the base. Associated atelectasis/collapse in the left base. Persistent interstitial changes over the right base. Cardiomediastinal silhouette and remainder the exam is unchanged. IMPRESSION: New moderate left pneumothorax worse over the left base with associated left lower lobe collapse/atelectasis. Stable mild interstitial changes right base. Emphysema (ICD10-J43.9). Electronically Signed: By: Marin Olp M.D. On: 09/08/2018 09:26   Dg Chest Port 1 View  Result Date: 09/06/2018 CLINICAL DATA:  Shortness of breath EXAM: PORTABLE CHEST 1 VIEW COMPARISON:  09/05/2018, 07/01/2018, 11/14/2017, CT chest 08/28/2018  FINDINGS: Small left-sided pleural effusion. Emphysematous disease. Probable skin fold artifact over the right thorax. Mild interstitial and alveolar opacity at both bases, slight increase. Post sternotomy changes. Aortic atherosclerosis. IMPRESSION: 1. Similar appearance of small left pleural effusion and bibasilar interstitial and hazy airspace disease which may reflect acute infiltrate superimposed  on chronic change/post treatment change 2. Emphysematous disease Electronically Signed   By: Donavan Foil M.D.   On: 09/06/2018 19:25   Dg Abd Portable 1v  Result Date: 09/19/2018 CLINICAL DATA:  Abdominal pain.  Constipated. EXAM: PORTABLE ABDOMEN - 1 VIEW COMPARISON:  PET-CT, 10/09/2017 FINDINGS: There is increased stool which projects in the cecum and ascending colon and right transverse colon. There is no bowel dilation to suggest obstruction. Subcutaneous air from the left chest wall extends to the left anterolateral abdominal wall. There are scattered vascular calcifications. No convincing renal or ureteral stones. Skip no acute skeletal abnormality. IMPRESSION: 1. Moderate increased stool in the right colon. 2. No acute findings.  No evidence of bowel obstruction. Electronically Signed   By: Lajean Manes M.D.   On: 09/19/2018 13:09    Microbiology: No results found for this or any previous visit (from the past 240 hour(s)).   Labs: CBC: Recent Labs  Lab 09/18/18 0309 09/19/18 0517 09/20/18 0706  WBC 20.2* 26.6* 16.1*  NEUTROABS  --  23.3* 13.1*  HGB 9.7* 10.9* 9.0*  HCT 31.0* 34.2* 28.6*  MCV 92.5 92.4 91.1  PLT 282 298 416   Basic Metabolic Panel: Recent Labs  Lab 09/18/18 0309 09/19/18 0517 09/20/18 0315  NA 139 137 137  K 4.2 4.7 5.0  CL 106 104 105  CO2 26 23 26   GLUCOSE 138* 141* 120*  BUN 34* 43* 43*  CREATININE 1.12* 1.39* 1.33*  CALCIUM 9.2 10.1 9.7   BNP (last 3 results) Recent Labs    10/27/17 0302  BNP 175.9*   CBG: Recent Labs  Lab 09/19/18 1145  09/19/18 1630 09/19/18 2221 09/20/18 0734 09/20/18 1152  GLUCAP 89 229* 161* 113* 141*   Time spent: 35 minutes  Signed:  Berle Mull  [facility] 09/20/2018 , 8:43 AM

## 2018-09-25 ENCOUNTER — Ambulatory Visit: Payer: Medicare Other | Admitting: Podiatry

## 2018-09-30 ENCOUNTER — Ambulatory Visit (INDEPENDENT_AMBULATORY_CARE_PROVIDER_SITE_OTHER)
Admission: RE | Admit: 2018-09-30 | Discharge: 2018-09-30 | Disposition: A | Discharge status: Home / Self Care | Payer: Medicare Other | Source: Ambulatory Visit | Attending: Nurse Practitioner | Admitting: Nurse Practitioner

## 2018-09-30 ENCOUNTER — Encounter: Payer: Self-pay | Admitting: Nurse Practitioner

## 2018-09-30 ENCOUNTER — Ambulatory Visit: Payer: Medicare Other | Admitting: Nurse Practitioner

## 2018-09-30 VITALS — BP 124/70 | HR 71 | Temp 97.9°F | Wt 93.2 lb

## 2018-09-30 DIAGNOSIS — J9621 Acute and chronic respiratory failure with hypoxia: Secondary | ICD-10-CM | POA: Diagnosis not present

## 2018-09-30 DIAGNOSIS — J939 Pneumothorax, unspecified: Secondary | ICD-10-CM

## 2018-09-30 DIAGNOSIS — J449 Chronic obstructive pulmonary disease, unspecified: Secondary | ICD-10-CM

## 2018-09-30 MED ORDER — NYSTATIN 100000 UNIT/ML MT SUSP: 5.0000 mL | Freq: Four times a day (QID) | OROMUCOSAL | 0 refills | Status: DC

## 2018-09-30 MED ORDER — BENZONATATE 100 MG PO CAPS: 100.0000 mg | ORAL_CAPSULE | Freq: Three times a day (TID) | ORAL | 1 refills | Status: DC

## 2018-09-30 NOTE — Assessment & Plan Note (Signed)
Resolved - will repeat chest x ray today

## 2018-09-30 NOTE — Patient Instructions (Addendum)
Continue current medications Will order nystatin for thrush Will order tessalon pearls Continue O2 at 3.5 L [place]  - may wean to keep sats above 90% Will check chest x ray and call with results Continue with home PT Follow up with Dr. Chase Caller in 3 months or sooner if needed

## 2018-09-30 NOTE — Assessment & Plan Note (Signed)
Continue continuous O2 at 3-4 L [place] to keep sats above 90%

## 2018-09-30 NOTE — Assessment & Plan Note (Addendum)
Patient has been doing well since hospital discharge. Continue current medications. COPD is stable.  Patient Instructions  Continue current medications Will order nystatin for thrush Will order tessalon pearls Continue O2 at 3.5 L [place]  - may wean to keep sats above 90% Will check chest x ray and call with results Continue with home PT Follow up with Dr. Chase Caller in 3 months or sooner if needed

## 2018-09-30 NOTE — Progress Notes (Signed)
@Patient  ID: [name], female    DOB: Jun 20, 1932, 85 y.o.   MRN: [id number]  Chief Complaint  Patient presents with  . Hospitalization Follow-up    Referring provider: Harlan Stains, MD  HPI 85 year old female former smoker with COPD gold stage 2 but severe reducton in dlco (fev may 2019 - 0.8L/60% and dlco 29%), type 2 diabetes, diabetes mellitus, coronary artery disease, GERD, Stage I lung cancer [presumed stage I LLL nodule - empiric XRT ending April 2019).  She follows with Dr. Chase Caller.  History noted for PET positive, left lower lobe spiculated nodule Discussed at multidisciplinary tumor board and biopsy deferred, Underwent SBRT in April 2019  Recent significant TESTS/EVENTS:  Imaging: CT chest 08/28/2018- advanced emphysema, left lower lobe lung nodule with bandlike interstitial airspace opacity suggestive of radiation fibrosis. Chest x-ray 12/22 > moderate-sized left pneumothorax CXR 12/23- interval improvement in size of L-sided PTX, however small sized L PTX still present. Pigtail placement unchanged.  CXR 12/26 > no PTX CXR 09/19/18> No evidence of a pneumothorax following left-sided chest tube removal. No change in the appearance of the lungs since the earlier exam.  Hospital course: 12/19 Admit 12/22 Lt spontaneous pneumothorax s/p chest tube placement 12/23 Chest tube placed 12/22 remains in place, to suction, without air leak. Patient reports better pain management with changes made to pain regimen by primary team 12/26 chest tube changed to water seal 12/27 chest tube clamped 12/29 recurrent PTX after chest tube clamped, placed back to suction 12/31 chest tube placed on water seal for one last attempt before proceeding with talc pleurodesis 01/01 Talc pleurodesis   OV 09/30/18 - hospital follow up 09/11/18 - 09/20/18 Patient presents for a follow up from recent hospitalization.  She was admitted with acute on chronic respiratory failure with hypoxia secondary  to multifocal pneumonia, COPD exacerbation, acute pneumonia pneumothorax, and stage I lung cancer.  She had a chest tube placed on 09/08/2018.  Recurrent PTX after chest tube clamped and was placed back to suction.  Underwent talc pleurodesis on 09/18/2018.  Was treated with IV antibiotics Zithromax and IV Rocephin.  He was discharged home on 09/20/2018.  She states that she has been doing well since discharge.  Her chest tube site is healing well.  He has been using her flutter device.  She is on 3.5 L nasal cannula continuous oxygen at home.  Her O2 sats have been stable.  Her vital signs in office today are stable.  She has been afebrile and is afebrile in the office today.  She looks well today and has had her hair done and make-up done today.  She is scheduled to start physical therapy at home tomorrow. Patient does complain of some throat irritation today.    Allergies  Allergen Reactions  . Lipitor [Atorvastatin] Other (See Comments)    myalgias myalgias  . Lisinopril Other (See Comments)    cough cough  . Amaryl [Glimepiride]     Side effects  . Anoro Ellipta [Umeclidinium-Vilanterol]     Increase BP  . Avelox [Moxifloxacin Hcl In Nacl]     nausea  . Ciprofloxacin     nausea  . Other Other (See Comments)    Increased cough  . Prevacid [Lansoprazole]     diarrhea  . Spiriva Handihaler [Tiotropium Bromide Monohydrate]     Increased cough  . Symbicort [Budesonide-Formoterol Fumarate] Cough    Increased cough    Immunization History  Administered Date(s) Administered  . Influenza Split  07/19/2014  . Influenza, High Dose Seasonal PF 06/16/2016, 05/29/2017, 06/17/2018  . Influenza,inj,Quad PF,6+ Mos 06/19/2015  . Pneumococcal Conjugate-13 09/18/2013  . Pneumococcal Polysaccharide-23 05/14/2006, 08/12/2018  . Tdap 03/04/2013  . Zoster 08/20/2008, 08/18/2014    Past Medical History:  Diagnosis Date  . Anxiety   . Aortic stenosis   . Arthritis   . Asthma    uses inhaler as  needed  . Depression   . Diabetes mellitus   . Emphysema of lung ([place])   . GERD (gastroesophageal reflux disease)   . Glaucoma   . Hyperlipidemia   . Hypertension   . [name]   . Murmur   . Nephrolithiasis   . Pneumonia 3/17 and 3/18    Tobacco History: Social History   Tobacco Use  Smoking Status Former Smoker  . Packs/day: 2.00  . Years: 15.00  . Pack years: 30.00  . Types: Cigarettes  . Last attempt to quit: 09/18/1972  . Years since quitting: 46.0  Smokeless Tobacco Never Used   Counseling given: Yes   Outpatient Encounter Medications as of 09/30/2018  Medication Sig  . acetaminophen (TYLENOL) 500 MG tablet Take 500 mg by mouth every 6 (six) hours as needed for headache.  . albuterol (PROVENTIL) (2.5 MG/3ML) 0.083% nebulizer solution Take 3 mLs (2.5 mg total) by nebulization every 6 (six) hours as needed for wheezing.  [name] alendronate (FOSAMAX) 70 MG tablet Take 70 mg by mouth once a week. Take with a full glass of water on an empty stomach.  [name] amLODipine (NORVASC) 10 MG tablet Take 1 tablet (10 mg total) by mouth daily.  Jearl Klinefelter ELLIPTA 62.5-25 MCG/INH AEPB USE 1 INHALATION BY MOUTH  DAILY  . aspirin EC 81 MG tablet Take 81 mg by mouth at bedtime.  . benzonatate (TESSALON) 100 MG capsule Take 1 capsule (100 mg total) by mouth 3 (three) times daily.  [name] buPROPion (WELLBUTRIN XL) 150 MG 24 hr tablet Take 150 mg by mouth daily.  . cholecalciferol (VITAMIN D) 1000 units tablet Take 1,000 Units by mouth at bedtime.   . cloNIDine (CATAPRES) 0.1 MG tablet Take 0.1 mg by mouth 2 (two) times daily.  [name] dextromethorphan-guaiFENesin (MUCINEX DM) 30-600 MG 12hr tablet Take 1 tablet by mouth 2 (two) times daily.  Mariane Baumgarten Sodium (COLACE PO) Take 100 mg by mouth 2 (two) times daily.   . feeding supplement, GLUCERNA SHAKE, (GLUCERNA SHAKE) LIQD Take 237 mLs by mouth 3 (three) times daily between meals.  . Ferrous Sulfate (IRON) 325 (65 Fe) MG TABS Take 325 mg by  mouth 2 (two) times daily.   . fluticasone (FLOVENT HFA) 220 MCG/ACT inhaler Inhale 2 puffs into the lungs 2 (two) times daily.  . furosemide (LASIX) 20 MG tablet Take 1 tablet (20 mg total) by mouth once a week.  [name] guaiFENesin (ROBITUSSIN) 100 MG/5ML SOLN Take 5 mLs (100 mg total) by mouth every 4 (four) hours as needed for cough or to loosen phlegm.  . irbesartan (AVAPRO) 150 MG tablet Take 1 tablet (150 mg total) by mouth daily.  . Magnesium 250 MG TABS Take 250 mg by mouth at bedtime.   [name] menthol-cetylpyridinium (CEPACOL) 3 MG lozenge Take 1 lozenge (3 mg total) by mouth as needed for sore throat.  . Multiple Vitamins-Minerals (OCUVITE EYE HEALTH FORMULA PO) Take 1 capsule by mouth daily.   . polyethylene glycol (MIRALAX / GLYCOLAX) packet Take 17 g by mouth daily.  . pravastatin (PRAVACHOL) 40 MG tablet  Take 40 mg by mouth every evening.   [name] PROAIR HFA 108 (90 BASE) MCG/ACT inhaler Inhale 2 puffs into the lungs 4 (four) times daily as needed for wheezing or shortness of breath.   . senna-docusate (SENOKOT-S) 8.6-50 MG tablet Take 1 tablet by mouth 2 (two) times daily.  . sertraline (ZOLOFT) 100 MG tablet Take 1 tablet (100 mg total) by mouth every morning.  . Tiotropium Bromide-Olodaterol (STIOLTO RESPIMAT) 2.5-2.5 MCG/ACT AERS Inhale 2 puffs into the lungs daily.  . benzonatate (TESSALON) 100 MG capsule Take 1 capsule (100 mg total) by mouth 3 (three) times daily.  [name] nystatin (MYCOSTATIN) 100000 UNIT/ML suspension Take 5 mLs (500,000 Units total) by mouth 4 (four) times daily.  . predniSONE (DELTASONE) 10 MG tablet Take 20mg  daily for 3days,Take 10mg  daily for 3days, then stop (Patient not taking: Reported on 09/30/2018)   No facility-administered encounter medications on file as of 09/30/2018.      Review of Systems  Review of Systems  Constitutional: Negative.  Negative for chills and fever.  HENT: Positive for sore throat.   Respiratory: Positive for cough. Negative for shortness of  breath and wheezing.   Cardiovascular: Negative.  Negative for chest pain, palpitations and leg swelling.  Gastrointestinal: Negative.   Allergic/Immunologic: Negative.   Neurological: Negative.   Psychiatric/Behavioral: Negative.        Physical Exam  BP 124/70 (BP Location: Right Arm, Patient Position: Sitting, Cuff Size: Normal)   Pulse 71   Temp 97.9 F (36.6 C)   Wt 93 lb 3.2 oz (42.3 kg)   SpO2 93% Comment: on 3L O2 pulse  BMI 18.20 kg/m   Wt Readings from Last 5 Encounters:  09/30/18 93 lb 3.2 oz (42.3 kg)  09/05/18 100 lb (45.4 kg)  08/12/18 99 lb 9.6 oz (45.2 kg)  08/08/18 101 lb 6.4 oz (46 kg)  07/01/18 104 lb 9.6 oz (47.4 kg)     Physical Exam Vitals signs and nursing note reviewed.  Constitutional:      General: She is not in acute distress.    Appearance: She is well-developed.     Comments: Well dressed, hair recently styled, wearing make-up  HENT:     Head:     Comments: Oral thrush noted.  Cardiovascular:     Rate and Rhythm: Normal rate and regular rhythm.  Pulmonary:     Effort: Pulmonary effort is normal.     Breath sounds: Normal breath sounds.  Chest:     Comments: Chest tube site well healed.  Musculoskeletal:        General: No swelling.  Neurological:     Mental Status: She is alert and oriented to person, place, and time.      Imaging: Dg Chest 2 View  Result Date: 09/30/2018 CLINICAL DATA:  Left pneumothorax, pneumonia, acute respiratory failure. EXAM: CHEST - 2 VIEW COMPARISON:  09/19/2018 FINDINGS: There is hyperinflation of the lungs compatible with COPD. No visible pneumothorax. Near complete resolution of the subcutaneous emphysema. Increased markings in the mid and lower lungs, likely scarring. Possible small effusions. Heart is normal size. Prior median sternotomy and valve replacement. Small hiatal hernia. IMPRESSION: Severe COPD/chronic changes. Small effusions. No visible residual pneumothorax. Electronically Signed   By:  Rolm Baptise M.D.   On: 09/30/2018 10:02   Dg Chest 2 View  Result Date: 09/05/2018 CLINICAL DATA:  Productive cough and shortness of breath for the past 2-3 days. Clinical stage I non-small cell lung cancer in the left  lower lobe. EXAM: CHEST - 2 VIEW COMPARISON:  Chest radiographs dated 07/01/2018. Chest CT dated 08/28/2018. FINDINGS: Normal sized heart. Significantly increase left lower airspace opacity. Small left pleural effusion, increased. Stable hyperexpansion of lungs and mild patchy opacity at the right lung base. Stable mild right pleural thickening. Mild thoracic spine degenerative changes. IMPRESSION: 1. Significantly increased left lower lobe pneumonia. 2. Small left pleural effusion, increased. 3. Stable mild pneumonia or postradiation changes at the right lung base. 4. Stable mild changes of COPD. Electronically Signed   By: Claudie Revering M.D.   On: 09/05/2018 16:15   Dg Chest Port 1 View  Result Date: 09/19/2018 CLINICAL DATA:  Chest tube removal. Short of breath. Pneumothorax follow up. EXAM: PORTABLE CHEST 1 VIEW COMPARISON:  09/19/2018 at 6:06 a.m. FINDINGS: Since the earlier exam, the left chest tube has been removed. There is no convincing pneumothorax. Subcutaneous emphysema along the anterolateral chest wall and left neck base is unchanged. There is persistent opacity at both lung bases consistent with atelectasis, pneumonia or a combination. IMPRESSION: 1. No evidence of a pneumothorax following left-sided chest tube removal. No change in the appearance of the lungs since the earlier exam. Electronically Signed   By: Lajean Manes M.D.   On: 09/19/2018 13:08   Dg Chest Port 1 View  Result Date: 09/19/2018 CLINICAL DATA:  85 year old female with spontaneous pneumothorax status post COPD exacerbation with multifocal pneumonia. Left chest tube placed. EXAM: PORTABLE CHEST 1 VIEW COMPARISON:  09/18/2018 and earlier. FINDINGS: Portable AP upright view at 0606 hours. Pigtail left chest  tube remains in place and appears stable. Moderate volume left chest wall and neck subcutaneous gas is stable to mildly regressed. No left pneumothorax is evident today. There is hyperlucency in both upper lungs related to emphysema. Stable patchy bibasilar opacity. No pulmonary edema. Stable cardiac size and mediastinal contours. Moderate size gastric hiatal hernia. Negative visible bowel gas pattern. IMPRESSION: 1. Stable left chest tube with no pneumothorax identified today. 2. Emphysema with continued patchy bibasilar opacity. No new cardiopulmonary abnormality. 3. Hiatal hernia. Electronically Signed   By: Genevie Ann M.D.   On: 09/19/2018 07:43   Dg Chest Port 1 View  Addendum Date: 09/18/2018   ADDENDUM REPORT: 09/18/2018 10:14 ADDENDUM: When I initially reviewed the examination with the care provider, I did not detect the subtle right apical pneumothorax. However, upon further review there is a tiny residual left apical pneumothorax. I contacted the patient's nurse to communicate this finding to the care provider. Critical Value/emergent results were called by telephone at the time of interpretation on 09/18/2018 at 10:13 am to the patient's nurse , who verbally acknowledged these results. Electronically Signed   By: Lorriane Shire M.D.   On: 09/18/2018 10:14   Result Date: 09/18/2018 CLINICAL DATA:  Pneumothorax. EXAM: PORTABLE CHEST 1 VIEW COMPARISON:  09/17/2018 FINDINGS: Left chest tube remains in place. The tiny pneumothorax at the left base has resolved. There is a very subtle small left apical pneumothorax, diminished. Increased subcutaneous emphysema on the left. Aeration at the bases has slightly improved. Severe emphysematous changes in the upper lung zones. Heart size is normal. Chronic prominence of the pulmonary arteries. IMPRESSION: Minimal residual left apical pneumothorax. Improved aeration at the bases. Electronically Signed: By: Lorriane Shire M.D. On: 09/18/2018 10:02   Dg Chest Port 1  View  Result Date: 09/17/2018 CLINICAL DATA:  Insertion of new left chest tube. Left pneumothorax. EXAM: PORTABLE CHEST 1 VIEW 4:15 p.m. COMPARISON:  09/17/2018  at 2:28 p.m. and CT scan of the chest dated 08/28/2018 FINDINGS: The prior chest tube has been removed and a new chest tube has been inserted at the left lung base. There is only a tiny residual left apical pneumothorax and there is also a tiny pneumothorax at the left base laterally. There is new slight subcutaneous emphysema on the left extending into the left side of the neck. There is increased accentuation of the interstitial markings at the left lung base since the prior exam. There is chronic accentuation of the interstitial markings at the right base. Severe emphysematous changes are noted in the upper lung zones. Heart size and vascularity are normal. Aortic atherosclerosis. IMPRESSION: 1. New left chest tube. Tiny residual left pneumothorax at the left apex and left base. 2. Increased interstitial markings at the left lung base since the prior exam of unknown etiology. Electronically Signed   By: Lorriane Shire M.D.   On: 09/17/2018 16:28   Dg Chest Port 1 View  Result Date: 09/17/2018 CLINICAL DATA:  History of left pneumothorax with a chest tube in place. EXAM: PORTABLE CHEST 1 VIEW COMPARISON:  Single-view of the chest earlier today. FINDINGS: Left chest tube is in place. There is a left apical pneumothorax estimated at 20%. Bibasilar airspace disease is worse on the right and unchanged in appearance. Severe emphysema is seen. Heart size is normal. Aortic atherosclerosis is noted. IMPRESSION: Left pneumothorax estimated at 20% with a chest tube in place. No change in right greater than left basilar airspace disease. Emphysema. Atherosclerosis. Electronically Signed   By: Inge Rise M.D.   On: 09/17/2018 14:44   Dg Chest Port 1 View  Result Date: 09/17/2018 CLINICAL DATA:  Chest tube.  Shortness of breath.  Sore chest. EXAM:  PORTABLE CHEST 1 VIEW COMPARISON:  09/15/2018. FINDINGS: Left chest tube in stable position. Again no definite pneumothorax noted. Prior cardiac valve replacement. Heart size normal. Bibasilar atelectasis/infiltrates and small pleural effusions again noted. IMPRESSION: 1. Left chest tube in stable position. Again no definite pneumothorax identified. 2.  Prior cardiac valve replacement.  Heart size stable. 3. Stable bibasilar atelectasis/infiltrates and small bilateral pleural effusions. Electronically Signed   By: Marcello Moores  Register   On: 09/17/2018 06:57   Dg Chest Port 1 View  Result Date: 09/15/2018 CLINICAL DATA:  History of pneumothorax. EXAM: PORTABLE CHEST 1 VIEW COMPARISON:  Chest radiograph 09/15/2018 FINDINGS: Monitoring leads overlie the patient. Stable cardiomegaly. Aortic atherosclerosis. Similar-appearing mid and lower lung heterogeneous opacities. Small bilateral pleural effusions. Left chest tube stable in position. No definite left-sided pneumothorax identified on current exam. No right-sided pneumothorax identified. Thoracic spine degenerative changes. IMPRESSION: Left chest tube remains in position. No definite left-sided pneumothorax identified. No definite right-sided pneumothorax identified. Similar-appearing bibasilar consolidation and small effusions. Electronically Signed   By: Lovey Newcomer M.D.   On: 09/15/2018 12:50   Dg Chest Port 1 View  Result Date: 09/15/2018 CLINICAL DATA:  85 year old female with history of left-sided pneumothorax. No current chest pain. EXAM: PORTABLE CHEST 1 VIEW COMPARISON:  Chest x-ray 09/14/2018. FINDINGS: Left-sided chest tube remains in position with pigtail projecting over the lateral aspect of the left hemithorax. Small residual left-sided pneumothorax noted. Small amount of left-sided pleural effusion. Small right pleural effusion. Bibasilar opacities which may reflect areas of atelectasis and/or airspace consolidation. Large hiatal hernia. No  evidence of pulmonary edema. Probable skin fold artifact projecting over the right hemithorax. Heart size is normal. Upper mediastinal contours are within normal limits. Aortic atherosclerosis.  Status post median sternotomy for aortic valve replacement with a bioprosthetic aortic valve. IMPRESSION: 1. Left-sided chest tube appears stable in position. There is an apparent recurrence of a small left pneumothorax. In addition, there is what appears to be a skin fold artifact projecting over the right hemithorax. The possibility of left-sided skinfold artifact is not excluded, but not favored. Repeat chest radiograph (with efforts to mitigate potential skinfold artifact) is recommended to better evaluate these findings. 2. Persistent areas of atelectasis and/or consolidation in the lung bases bilaterally with small bilateral pleural effusions. 3. Large hiatal hernia. Electronically Signed   By: Vinnie Langton M.D.   On: 09/15/2018 08:54   Dg Chest Port 1 View  Result Date: 09/14/2018 CLINICAL DATA:  Pneumothorax, chest tube adjustment. EXAM: PORTABLE CHEST 1 VIEW COMPARISON:  September 14, 2018 12:39 p.m. FINDINGS: The heart size and mediastinal contours are stable. Left chest tube is unchanged. There is small left pleural effusion. There is no pleural line in the left hemithorax to suggest pneumothorax. Minimal right pleural effusion is noted. The visualized skeletal structures are stable. IMPRESSION: Left chest tube is identified in the lateral left mid to lower hemithorax unchanged compared prior exam. Small left pleural effusion. No definite pleural line is noted to suggest pneumothorax. Electronically Signed   By: Abelardo Diesel M.D.   On: 09/14/2018 22:33   Dg Chest Port 1 View  Result Date: 09/14/2018 CLINICAL DATA:  Left-sided pneumothorax EXAM: PORTABLE CHEST 1 VIEW COMPARISON:  09/06/2018 at 0517 hours FINDINGS: Left-sided chest tube remains in place along the periphery of the left mid lung. No  recurrence or visible left-sided pneumothorax is identified. There is bibasilar atelectasis with minimal blunting of the left lateral costophrenic angle presumably a small left effusion unchanged in appearance. No mediastinal shift. Median sternotomy sutures are in place with evidence of prior aortic valvular replacement and post CABG change. Heart size is normal with probable small hiatal hernia superimposed. IMPRESSION: Stable left-sided chest tube without recurrence or visible pneumothorax. Electronically Signed   By: Ashley Royalty M.D.   On: 09/14/2018 14:32   Dg Chest Port 1 View  Result Date: 09/14/2018 CLINICAL DATA:  Follow-up pneumothorax EXAM: PORTABLE CHEST 1 VIEW COMPARISON:  Second film from yesterday FINDINGS: Left apical pneumothorax on prior is no longer seen. Skin fold seen at the lateral bases on both sides. Small left pleural effusion. Atelectasis at both bases. Normal heart size. Prior aortic valve replacement. IMPRESSION: Apical pneumothorax on prior is no longer seen. Otherwise stable. Electronically Signed   By: Monte Fantasia M.D.   On: 09/14/2018 07:46   Dg Chest Port 1 View  Result Date: 09/13/2018 CLINICAL DATA:  Follow-up LEFT pneumothorax. Small caliber LEFT chest tube in place but recently clamped. Current history of LEFT LOWER LOBE lung cancer treated with radiation therapy. EXAM: PORTABLE CHEST 1 VIEW 1:08 p.m.: COMPARISON:  Chest x-ray earlier same day 7:22 a.m., yesterday and previously. FINDINGS: The RIGHT apical pneumothorax has increased slightly since the examination earlier today and yesterday, still on the order of 5% or so. Small caliber LEFT chest tube remains in place. Bullous emphysematous changes in the UPPER lobes, LEFT greater than RIGHT as noted previously. Pleuroparenchymal scarring in the bases, LEFT greater than RIGHT. No new pulmonary parenchymal abnormalities. Prior sternotomy for aortic valve replacement and CABG. Cardiac silhouette upper normal in size,  unchanged. Thoracic aorta atherosclerotic, unchanged. Prominent central pulmonary arteries, unchanged. IMPRESSION: 1. Slight interval increase in the RIGHT apical pneumothorax since the examination  earlier today, still on the order of 5% or so, with small caliber LEFT chest tube in place. 2. Stable pleuroparenchymal scarring in the bases, LEFT greater than RIGHT. 3.  Emphysema. (ICD10-J43.9) 4. No new abnormalities. Electronically Signed   By: Evangeline Dakin M.D.   On: 09/13/2018 13:38   Dg Chest Port 1 View  Result Date: 09/13/2018 CLINICAL DATA:  Shortness of breath with pneumothorax EXAM: PORTABLE CHEST 1 VIEW COMPARISON:  Yesterday FINDINGS: When accounting for apical bullous emphysema there is no convincing residual pneumothorax. Left chest tube is seen in stable position. Stable costophrenic sulcus blunting. Normal heart size. Aortic valve replacement. Moderate hiatal hernia. IMPRESSION: No visible residual pneumothorax. Electronically Signed   By: Monte Fantasia M.D.   On: 09/13/2018 07:55   Dg Chest Port 1 View  Result Date: 09/12/2018 CLINICAL DATA:  Pneumothorax EXAM: PORTABLE CHEST 1 VIEW COMPARISON:  Portable exam 1703 hours compared to 09/11/2018 FINDINGS: Pigtail LEFT thoracostomy tube again seen, unchanged. Upper normal heart size post AVR. Atherosclerotic calcification aorta. Emphysematous and bronchitic changes consistent with COPD. Blunting of the costophrenic angles bilaterally by small pleural effusions with associated mild bibasilar atelectasis. Small LEFT apex pneumothorax. Remaining lungs clear. Skin fold projects over RIGHT chest. Bones demineralized. IMPRESSION: Minimal residual LEFT apex pneumothorax. COPD changes with bibasilar atelectasis and small effusions. Electronically Signed   By: Lavonia Dana M.D.   On: 09/12/2018 17:26   Dg Chest Port 1 View  Result Date: 09/11/2018 CLINICAL DATA:  Left-sided pneumothorax EXAM: PORTABLE CHEST 1 VIEW COMPARISON:  Prior exams dating  back through 09/09/2018. FINDINGS: Further decrease in size of tiny left apical pneumothorax, now barely visible. Left-sided chest tube is in place. Chronic stable blunting the left lateral costophrenic angle presumably from small left effusion with adjacent atelectasis. Improved aeration at the left lung base. Heart and mediastinal contours are stable with moderate aortic atherosclerosis. No aneurysm. Median sternotomy sutures are place with aortic valvular replacement and post CABG change. IMPRESSION: Further decrease in size of tiny left apical pneumothorax. Left-sided chest tube is in place. Improved aeration at the left lung base with unchanged presumed small left effusion. Electronically Signed   By: Ashley Royalty M.D.   On: 09/11/2018 17:42   Dg Chest Port 1 View  Result Date: 09/10/2018 CLINICAL DATA:  Followup left pneumothorax and chest tube. EXAM: PORTABLE CHEST 1 VIEW COMPARISON:  Yesterday. FINDINGS: Skin folds are demonstrated on the left. Significant reduction in the left pneumothorax with a less than 5% apical pneumothorax remaining. A small caliber left chest tube remains in place. No significant change in a small left pleural effusion and bibasilar atelectasis or pneumonia. Mild-to-moderate peribronchial thickening is unchanged. Diffuse osteopenia. IMPRESSION: 1. Significant reduction in the left pneumothorax with a less than 5% apical pneumothorax remaining. 2. Stable small left pleural effusion and bibasilar atelectasis or pneumonia. 3. Stable mild to moderate bronchitic changes. Electronically Signed   By: Claudie Revering M.D.   On: 09/10/2018 08:24   Dg Chest Port 1 View  Result Date: 09/09/2018 CLINICAL DATA:  Chest tube, pneumothorax EXAM: PORTABLE CHEST 1 VIEW COMPARISON:  09/08/2018 FINDINGS: Sternotomy wires overlie normal cardiac silhouette. Persistent small LEFT pneumothorax laterally measures 11 mm from the chest wall. Pneumothorax appears decreased in volume compared to 1 day  prior. Pigtail catheter along the LEFT lateral chest wall. Small LEFT effusion. RIGHT lung clear IMPRESSION: 1. Decrease in volume of small LEFT pneumothorax with chest tube in place 2. Small LEFT effusion. Electronically Signed  By: Suzy Bouchard M.D.   On: 09/09/2018 07:55   Dg Chest Port 1 View  Result Date: 09/08/2018 CLINICAL DATA:  Pneumothorax, chest tube EXAM: PORTABLE CHEST 1 VIEW COMPARISON:  Portable exam 1512 hours compared to 0909 hours FINDINGS: New pigtail LEFT thoracostomy tube. Persistent moderate LEFT pneumothorax estimated 20% though decreased from previous exam. Persistent LEFT pleural effusion. Underlying emphysematous and bronchitic changes consistent with COPD. Skin fold projects over LEFT chest as well. Persistent atelectasis versus infiltrate at RIGHT base. Normal heart size post AVR. Atherosclerotic calcifications aorta. IMPRESSION: Moderate LEFT pneumothorax decreased since pigtail thoracostomy tube placement. COPD changes with LEFT pleural effusion and RIGHT basilar atelectasis versus infiltrate. Electronically Signed   By: Lavonia Dana M.D.   On: 09/08/2018 15:30   Dg Chest Port 1 View  Addendum Date: 09/08/2018   ADDENDUM REPORT: 09/08/2018 10:28 ADDENDUM: Critical Value/emergent results were called by telephone at the time of interpretation on 09/08/2018 at 9:58 a.m. to Dr. Berle Mull , who verbally acknowledged these results. Electronically Signed   By: Marin Olp M.D.   On: 09/08/2018 10:28   Result Date: 09/08/2018 CLINICAL DATA:  Shortness of breath getting better. EXAM: PORTABLE CHEST 1 VIEW COMPARISON:  09/06/2018 FINDINGS: Lungs are adequately inflated with emphysematous disease. There is a new moderate left pneumothorax worse over the base. Associated atelectasis/collapse in the left base. Persistent interstitial changes over the right base. Cardiomediastinal silhouette and remainder the exam is unchanged. IMPRESSION: New moderate left pneumothorax worse  over the left base with associated left lower lobe collapse/atelectasis. Stable mild interstitial changes right base. Emphysema (ICD10-J43.9). Electronically Signed: By: Marin Olp M.D. On: 09/08/2018 09:26   Dg Chest Port 1 View  Result Date: 09/06/2018 CLINICAL DATA:  Shortness of breath EXAM: PORTABLE CHEST 1 VIEW COMPARISON:  09/05/2018, 07/01/2018, 11/14/2017, CT chest 08/28/2018 FINDINGS: Small left-sided pleural effusion. Emphysematous disease. Probable skin fold artifact over the right thorax. Mild interstitial and alveolar opacity at both bases, slight increase. Post sternotomy changes. Aortic atherosclerosis. IMPRESSION: 1. Similar appearance of small left pleural effusion and bibasilar interstitial and hazy airspace disease which may reflect acute infiltrate superimposed on chronic change/post treatment change 2. Emphysematous disease Electronically Signed   By: Donavan Foil M.D.   On: 09/06/2018 19:25   Dg Abd Portable 1v  Result Date: 09/19/2018 CLINICAL DATA:  Abdominal pain.  Constipated. EXAM: PORTABLE ABDOMEN - 1 VIEW COMPARISON:  PET-CT, 10/09/2017 FINDINGS: There is increased stool which projects in the cecum and ascending colon and right transverse colon. There is no bowel dilation to suggest obstruction. Subcutaneous air from the left chest wall extends to the left anterolateral abdominal wall. There are scattered vascular calcifications. No convincing renal or ureteral stones. Skip no acute skeletal abnormality. IMPRESSION: 1. Moderate increased stool in the right colon. 2. No acute findings.  No evidence of bowel obstruction. Electronically Signed   By: Lajean Manes M.D.   On: 09/19/2018 13:09     Assessment & Plan:   Acute on chronic respiratory failure with hypoxia (HCC) Continue continuous O2 at 3-4 L [place] to keep sats above 90%  Pneumothorax on left Resolved - will repeat chest x ray today  COPD GOLD II  Patient has been doing well since hospital discharge. Continue  current medications. COPD is stable.  Patient Instructions  Continue current medications Will order nystatin for thrush Will order tessalon pearls Continue O2 at 3.5 L [place]  - may wean to keep sats above 90% Will check chest x  ray and call with results Continue with home PT Follow up with Dr. Chase Caller in 3 months or sooner if needed       Fenton Foy, NP 09/30/2018

## 2018-10-09 ENCOUNTER — Ambulatory Visit: Payer: Medicare Other | Admitting: Podiatry

## 2018-10-09 ENCOUNTER — Encounter: Payer: Self-pay | Admitting: Podiatry

## 2018-10-09 DIAGNOSIS — B351 Tinea unguium: Secondary | ICD-10-CM

## 2018-10-09 DIAGNOSIS — M79609 Pain in unspecified limb: Secondary | ICD-10-CM | POA: Diagnosis not present

## 2018-10-09 DIAGNOSIS — E119 Type 2 diabetes mellitus without complications: Secondary | ICD-10-CM

## 2018-10-09 NOTE — Progress Notes (Signed)
Complaint:  Visit Type: Patient returns to my office for continued preventative foot care services. Complaint: Patient states" my nails have grown long and thick and become painful to walk and wear shoes" Patient has been  Diagnosed with DM. The patient presents for preventative foot care services. No changes to ROS.  Patient has just been released from the hospital.  Podiatric Exam: Vascular: dorsalis pedis and posterior tibial pulses are palpable bilateral. Capillary return is immediate. Temperature gradient is WNL. Skin turgor WNL  Sensorium: Normal Semmes Weinstein monofilament test. Normal tactile sensation bilaterally. Nail Exam: Pt has thick disfigured discolored nails with subungual debris noted bilateral entire nail hallux through fifth toenails Ulcer Exam: There is no evidence of ulcer or pre-ulcerative changes or infection. Orthopedic Exam: Muscle tone and strength are WNL. No limitations in general ROM. No crepitus or effusions noted. Foot type and digits show no abnormalities. Bony prominences are unremarkable. Skin: No Porokeratosis. No infection or ulcers  Diagnosis:  Onychomycosis, , Pain in right toe, pain in left toes  Treatment & Plan Procedures and Treatment: Consent by patient was obtained for treatment procedures.   Debridement of mycotic and hypertrophic toenails, 1 through 5 bilateral and clearing of subungual debris. No ulceration, no infection noted.  Return Visit-Office Procedure: Patient instructed to return to the office for a follow up visit 3 months for continued evaluation and treatment.    Gardiner Barefoot DPM

## 2018-11-18 ENCOUNTER — Encounter: Payer: Self-pay | Admitting: Internal Medicine

## 2018-11-18 ENCOUNTER — Ambulatory Visit: Payer: Medicare Other | Admitting: Internal Medicine

## 2018-11-18 VITALS — BP 104/64 | HR 74 | Ht 60.0 in | Wt 90.4 lb

## 2018-11-18 DIAGNOSIS — J449 Chronic obstructive pulmonary disease, unspecified: Secondary | ICD-10-CM | POA: Diagnosis not present

## 2018-11-18 DIAGNOSIS — J9611 Chronic respiratory failure with hypoxia: Secondary | ICD-10-CM | POA: Diagnosis not present

## 2018-11-18 MED ORDER — BENZONATATE 100 MG PO CAPS: 100.0000 mg | ORAL_CAPSULE | Freq: Three times a day (TID) | ORAL | 1 refills | Status: DC

## 2018-11-18 NOTE — Patient Instructions (Addendum)
ICD-10-CM   1. Chronic respiratory failure with hypoxia (HCC) J96.11   2. COPD GOLD II  J44.9     Copd Is stable  Plan Continue o2 and inahlers as before (stiolto and flovent) Albuterol as needed Avoid sick contacts  If feeling sick, then call us and we can see about treating over phone v coming in  Followup 6 months or sooner

## 2018-11-18 NOTE — Progress Notes (Signed)
OV 05/29/2017  Chief Complaint  Patient presents with  . Follow-up    COPD Gold II. Patient states that she is feeling very weak this morning.    Chronic hypoxemic respiratory failure on oxygen 2 L associated with Gold stage II COPD and large hiatal hernia. She tells me the summer 2018 she had syncopal episodes and then after she started wearing her oxygen on a continuous basis this has not recurred. She is now on [employer] and [employer]. Also in spring 2018 and in 2017 she's had recurrent pneumonias. Most recent chest x-ray July 2018 seem to s show resolution but with some chronic lung changes. Currently she is feeling well. She will have high dose flu shot today    OV 08/16/2017  Chief Complaint  Patient presents with  . Follow-up    Pt fell twice within to days dizzy outdoors and hit her back of the head twice in Oct 2018. Pt has pain on the left side affecting her breathing from the fall. Pt states not breathing well in last three weeks.   85 year old female with chronic COPD and chronic hypoxemic respiratory failure on 2 L oxygen.  She has abnormal chest x-ray on account of pneumonia in 2017 CT scan of the chest and a chest x-ray 1 in 2018 was abnormal.  Overall she feels a COPD stable with a COPD Score of 15 as documented below.  Most recently since her last visit in the last few to several weeks she has been having falls and imbalance issues.  She feels these falls are not related to oxygen but related to balance.  This resulted in scalp laceration and bruises in her hands.  She is now using a cane to walk.  She is up-to-date with her flu shot.  She wants her next follow-up in March 2019 because of fear of pneumonia she does want to have a good evaluation of the lungs with CT scan of the chest.    OV 02/06/2018  Chief Complaint  Patient presents with  . Follow-up    PFT done today.  Pt had a cough x4-5 weeks after saw SG 3/18 but states cough finally went away. Pt still  becomes SOB going up and down steps. DME: [employer], 2L pulse.   85 year old female with =-  Gold stage II COPD with severe reduction in diffusion capacity and chronic hypoxemic respiratory failure on 2 L oxygen -Setting of large hiatal hernia -Setting of hyper already left lower lobe nodule status post radiation therapy ending in April 2019 -Setting of frequent mechanical falls last April 2019 -Setting of low body mass index  Ms. Alesia Oshields presents for follow-up.  Overall she is doing stable with her COPD.  She is on oxygen and Flovent.  She says she cannot take other inhalers because of cough.  When I asked her more questions she could not remember the inhaler.  Medicine review shows cough happened after Spiriva and Symbicort.  She suspected it was just a transient cough after she took the inhaler.  She does not recollect sustained increases and cough for the whole day.  There is no fever or chills nausea vomiting or hemoptysis.  And in the last 2 months he has not fallen.  In terms of her cancer chemotherapy therapy there is no chemotherapy.  She did just get empiric radiation 5 treatments ending April but apparently radiation oncology wants to give her some more treatments now.  She is due for  a follow-up CT scan.  Overall she is willing to try another inhaler to improve her symptoms because she had significant bronchodilator response on a PFT today.    07/01/2018 Patient presents today with acute complaints of coughing. Cough with green mucus x 3 days days with associated wheezing. This is her second exacerbation. Stopped Anoro. On flovent. Used proair a couple times today. Has neb at home. Starting taking mucinex and delsym cough syrup yesterday. Not having any trouble getting mucus up.   Hx Left lower lobe nodule s/p empiric radiation therapy ending in April 2019. Apparently radiation oncology wants to give her some more treatments now. She is due for a follow-up CT scan. Has apt on Nov 21  and will be following up with Dr. Chase Caller as well after.    OV 08/12/18  Chief Complaint  Patient presents with  . Follow-up    SOB during cold weather   Results for RUSSELL, ENGELSTAD (MRN [id number]) as of 02/06/2018 10:38 Results for YURITZI, KAMP (MRN [id number]) as of 02/06/2018 10:38  Ref. Range 11/07/2017 13:50  FEV1-Post Latest Units: L 0.93  FEV1-%Pred-Post Latest Units: % 68  FEV1-%Change-Post Latest Units: % 12   Results for CHESNEY, SUARES (MRN [id number]) as of 02/06/2018 10:38  Ref. Range 11/07/2017 13:50  DLCO unc Latest Units: ml/min/mmHg 5.13  DLCO unc % pred Latest Units: % 29    OV 08/12/2018  Subjective:  Patient ID: [name], female , DOB: [birth year] , age 36 y.o. , MRN: [id number] , ADDRESS: [address] [id number]   08/12/2018 -   Chief Complaint  Patient presents with  . Follow-up    SOB during cold weather     HPI MONZERAT HANDLER 85 y.o. -returns for follow-up.  She last saw me in May 2019.  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  She continues with oxygen and Flovent.  Last visit in May 2019 I added an order for better symptom control but she tells me this caused hypertension and high blood sugar.  She is now on clonidine which is helping the blood pressure.  She feels very convinced that this is because of an aura when she is not taking it anymore.  She says overall her COPD is stable.  There are no new issues.  In fact COPD CAT score is 15 and unchanged.  Review of immunization records show her last Pneumovax was over 10 years ago.  She is willing to try Stiolto for better symptom control.      OV Jan 4188 85 year old female former smoker with COPD gold stage 2 but severe reducton in dlco (fev may 2019 - 0.8L/60% and dlco 29%), type 2 diabetes, diabetes mellitus, coronary artery disease, GERD, Stage I lung cancer [presumed stage I LLL nodule - empiric XRT ending April 2019).  She follows with Dr. Chase Caller.   History noted for PET positive, left lower lobe spiculated nodule Discussed at multidisciplinary tumor board and biopsy deferred, Underwent SBRT in April 2019  Recent significant TESTS/EVENTS:  Imaging: CT chest 08/28/2018- advanced emphysema, left lower lobe lung nodule with bandlike interstitial airspace opacity suggestive of radiation fibrosis. Chest x-ray 12/22 > moderate-sized left pneumothorax CXR 12/23- interval improvement in size of L-sided PTX, however small sized L PTX still present. Pigtail placement unchanged.  CXR 12/26 > no PTX CXR 09/19/18> No evidence of a pneumothorax following left-sided chest tube removal. No change in the appearance of the  lungs since the earlier exam.  Hospital course: 12/19 Admit 12/22 Lt spontaneous pneumothorax s/p chest tube placement 12/23 Chest tube placed 12/22 remains in place, to suction, without air leak. Patient reports better pain management with changes made to pain regimen by primary team 12/26 chest tube changed to water seal 12/27 chest tube clamped 12/29 recurrent PTX after chest tube clamped, placed back to suction 12/31 chest tube placed on water seal for one last attempt before proceeding with talc pleurodesis 01/01 Talc pleurodesis   OV 09/30/18 - hospital follow up 09/11/18 - 09/20/18 Patient presents for a follow up from recent hospitalization.  She was admitted with acute on chronic respiratory failure with hypoxia secondary to multifocal pneumonia, COPD exacerbation, acute pneumonia pneumothorax, and stage I lung cancer.  She had a chest tube placed on 09/08/2018.  Recurrent PTX after chest tube clamped and was placed back to suction.  Underwent talc pleurodesis on 09/18/2018.  Was treated with IV antibiotics Zithromax and IV Rocephin.  He was discharged home on 09/20/2018.  She states that she has been doing well since discharge.  Her chest tube site is healing well.  He has been using her flutter device.  She is on 3.5 L nasal cannula  continuous oxygen at home.  Her O2 sats have been stable.  Her vital signs in office today are stable.  She has been afebrile and is afebrile in the office today.  She looks well today and has had her hair done and make-up done today.  She is scheduled to start physical therapy at home tomorrow. Patient does complain of some throat irritation today.  OV 11/18/2018  Subjective:  Patient ID: [name], female , DOB: [birth year] , age 24 y.o. , MRN: [id number] , ADDRESS: [address] [id number]   11/18/2018 -   Chief Complaint  Patient presents with  . Follow-up    Pt states she has been doing okay since last visit. States her breathing is about the same and states with exertion, will still become SOB.     HPI DAWANA ASPER 85 y.o. -Gold stage II COPD by pulmonary function test but actually behaves like gold stage IV COPD with chronic hypoxemic respiratory failure.  She is here for routine follow-up.  COPD stable at this stage.  No new issues.  She continues on triple inhaler therapy in the form of Flovent and Stiolto.  She is also on oxygen.  Since her pneumothorax treatment she has not had any further decline.  She does have limitations went to the basement and doing her laundry.  She has outsourced this to her husband.-      CAT COPD Symptom & Quality of Life Score ([place]) 0 is no burden. 5 is highest burden 08/16/2017  08/12/2018  11/18/2018   Never Cough -> Cough all the time 2 2 2   No phlegm in chest -> Chest is full of phlegm 2 3 1   No chest tightness -> Chest feels very tight 0 1 1  No dyspnea for 1 flight stairs/hill -> Very dyspneic for 1 flight of stairs 4 2 3   No limitations for ADL at home -> Very limited with ADL at home 3 2 2   Confident leaving home -> Not at all confident leaving home 1 1 3   Sleep soundly -> Do not sleep soundly because of lung condition 1 2 3   Lots of Energy -> No energy at all 3 2 2   TOTAL Score (max 40)  15 15 17    ROS - per  HPI     [name], Aortic stenosis, Arthritis, Asthma, Depression, Diabetes mellitus, Emphysema of lung ([place]), GERD (gastroesophageal reflux disease), Glaucoma, Hyperlipidemia, Hypertension, [name], Murmur, Nephrolithiasis, and Pneumonia (3/17 and 3/18).   reports that she quit smoking about 46 years ago. Her smoking use included cigarettes. She has a 30.00 pack-year smoking history. She has never used smokeless tobacco.  Past Surgical History:  Procedure Laterality Date  . ABDOMINAL HYSTERECTOMY    . ANKLE SURGERY  1998   d/t fx.  Left ankle  . ANTERIOR AND POSTERIOR REPAIR  06/21/2011   Procedure: ANTERIOR (CYSTOCELE) AND POSTERIOR REPAIR (RECTOCELE);  Surgeon: Maisie Fus;  Location: [facility];  Service: Gynecology;  Laterality: N/A;  . AORTIC VALVE REPLACEMENT N/A 07/17/2013   Procedure: AORTIC VALVE REPLACEMENT (AVR);  Surgeon: Ivin Poot, MD;  Location: [facility];  Service: Open Heart Surgery;  Laterality: N/A;  . CARDIAC CATHETERIZATION    . CARDIAC VALVE REPLACEMENT    . CORONARY ANGIOPLASTY    . CORONARY ARTERY BYPASS GRAFT N/A 07/17/2013   Procedure: CORONARY ARTERY BYPASS GRAFT, TIMES ONE, ON PUMP, USING RIGHT INTERNAL MAMMARY ARTERY.;  Surgeon: Ivin Poot, MD;  Location: [facility];  Service: Open Heart Surgery;  Laterality: N/A;  . FACIAL COSMETIC SURGERY     face lift  . INTRAOPERATIVE TRANSESOPHAGEAL ECHOCARDIOGRAM N/A 07/17/2013   Procedure: INTRAOPERATIVE TRANSESOPHAGEAL ECHOCARDIOGRAM;  Surgeon: Ivin Poot, MD;  Location: [facility];  Service: Open Heart Surgery;  Laterality: N/A;  . LAPAROSCOPIC ASSISTED VAGINAL HYSTERECTOMY  06/21/2011   Procedure: LAPAROSCOPIC ASSISTED VAGINAL HYSTERECTOMY;  Surgeon: Maisie Fus;  Location: [facility];  Service: Gynecology;  Laterality: N/A;  . LEFT AND RIGHT HEART CATHETERIZATION WITH CORONARY ANGIOGRAM N/A 06/27/2013   Procedure: LEFT AND RIGHT HEART CATHETERIZATION WITH CORONARY  ANGIOGRAM;  Surgeon: Ramond Dial, MD;  Location: [facility] C[facility];  Service: Cardiovascular;  Laterality: N/A;  . NOSE SURGERY    . SALPINGOOPHORECTOMY  06/21/2011   Procedure: SALPINGO OOPHERECTOMY;  Surgeon: Maisie Fus;  Location: [facility];  Service: Gynecology;  Laterality: Bilateral;  . VAGINAL PROLAPSE REPAIR  06/21/2011   Procedure: SACROPEXY;  Surgeon: Maisie Fus;  Location: [facility];  Service: Gynecology;  Laterality: N/A;  sacrospinous ligament suspension    Allergies  Allergen Reactions  . Lipitor [Atorvastatin] Other (See Comments)    myalgias myalgias  . Lisinopril Other (See Comments)    cough cough  . Amaryl [Glimepiride]     Side effects  . Anoro Ellipta [Umeclidinium-Vilanterol]     Increase BP  . Avelox [Moxifloxacin Hcl In Nacl]     nausea  . Ciprofloxacin     nausea  . Other Other (See Comments)    Increased cough  . Prevacid [Lansoprazole]     diarrhea  . Spiriva Handihaler [Tiotropium Bromide Monohydrate]     Increased cough  . Symbicort [Budesonide-Formoterol Fumarate] Cough    Increased cough    Immunization History  Administered Date(s) Administered  . Influenza Split 07/19/2014  . Influenza, High Dose Seasonal PF 06/16/2016, 05/29/2017, 06/17/2018  . Influenza,inj,Quad PF,6+ Mos 06/19/2015  . Pneumococcal Conjugate-13 09/18/2013  . Pneumococcal Polysaccharide-23 05/14/2006, 08/12/2018  . Tdap 03/04/2013  . Zoster 08/20/2008, 08/18/2014    Family History  Problem Relation Age of Onset  . Emphysema Father   . Colon cancer Neg Hx   . Esophageal cancer Neg Hx   .  Rectal cancer Neg Hx   . Stomach cancer Neg Hx      Current Outpatient Medications:  .  acetaminophen (TYLENOL) 500 MG tablet, Take 500 mg by mouth every 6 (six) hours as needed for headache., Disp: , Rfl:  .  albuterol (PROVENTIL) (2.5 MG/3ML) 0.083% nebulizer solution, Take 3 mLs (2.5 mg total) by nebulization every 6 (six) hours as needed for wheezing., Disp: 75 mL, Rfl: 0 .   alendronate (FOSAMAX) 70 MG tablet, Take 70 mg by mouth once a week. Take with a full glass of water on an empty stomach., Disp: , Rfl:  .  amLODipine (NORVASC) 10 MG tablet, Take 1 tablet (10 mg total) by mouth daily., Disp: 90 tablet, Rfl: 3 .  ANORO ELLIPTA 62.5-25 MCG/INH AEPB, USE 1 INHALATION BY MOUTH  DAILY, Disp: 180 each, Rfl: 0 .  aspirin EC 81 MG tablet, Take 81 mg by mouth at bedtime., Disp: , Rfl:  .  benzonatate (TESSALON) 100 MG capsule, Take 1 capsule (100 mg total) by mouth 3 (three) times daily., Disp: 30 capsule, Rfl: 1 .  buPROPion (WELLBUTRIN XL) 150 MG 24 hr tablet, Take 150 mg by mouth daily., Disp: , Rfl: 2 .  cholecalciferol (VITAMIN D) 1000 units tablet, Take 1,000 Units by mouth at bedtime. , Disp: , Rfl:  .  Docusate Sodium (COLACE PO), Take 100 mg by mouth 2 (two) times daily. , Disp: , Rfl:  .  Ferrous Sulfate (IRON) 325 (65 Fe) MG TABS, Take 325 mg by mouth 2 (two) times daily. , Disp: , Rfl:  .  fluticasone (FLOVENT HFA) 220 MCG/ACT inhaler, Inhale 2 puffs into the lungs 2 (two) times daily., Disp: , Rfl:  .  furosemide (LASIX) 20 MG tablet, Take 1 tablet (20 mg total) by mouth once a week., Disp: 30 tablet, Rfl: 0 .  irbesartan (AVAPRO) 300 MG tablet, , Disp: , Rfl:  .  Magnesium 250 MG TABS, Take 250 mg by mouth at bedtime. , Disp: , Rfl:  .  menthol-cetylpyridinium (CEPACOL) 3 MG lozenge, Take 1 lozenge (3 mg total) by mouth as needed for sore throat., Disp: 100 tablet, Rfl: 12 .  metFORMIN (GLUCOPHAGE-XR) 500 MG 24 hr tablet, TAKE 1 TABLET BY MOUTH EVERY DAY IN THE EVENING WITH DINNER, Disp: , Rfl:  .  Multiple Vitamins-Minerals (OCUVITE EYE HEALTH FORMULA PO), Take 1 capsule by mouth daily. , Disp: , Rfl:  .  polyethylene glycol (MIRALAX / GLYCOLAX) packet, Take 17 g by mouth daily., Disp: 14 each, Rfl: 0 .  pravastatin (PRAVACHOL) 40 MG tablet, Take 40 mg by mouth every evening. , Disp: , Rfl:  .  PROAIR HFA 108 (90 BASE) MCG/ACT inhaler, Inhale 2 puffs into  the lungs 4 (four) times daily as needed for wheezing or shortness of breath. , Disp: , Rfl:  .  sertraline (ZOLOFT) 100 MG tablet, Take 1 tablet (100 mg total) by mouth every morning., Disp: 30 tablet, Rfl: 0 .  Tiotropium Bromide-Olodaterol (STIOLTO RESPIMAT) 2.5-2.5 MCG/ACT AERS, Inhale 2 puffs into the lungs daily., Disp: 12 g, Rfl: 1 .  dextromethorphan-guaiFENesin (MUCINEX DM) 30-600 MG 12hr tablet, Take 1 tablet by mouth 2 (two) times daily. (Patient not taking: Reported on 11/18/2018), Disp: 60 tablet, Rfl: 0 .  feeding supplement, GLUCERNA SHAKE, (GLUCERNA SHAKE) LIQD, Take 237 mLs by mouth 3 (three) times daily between meals. (Patient not taking: Reported on 11/18/2018), Disp: 30 Can, Rfl: 0 .  guaiFENesin (ROBITUSSIN) 100 MG/5ML SOLN, Take 5 mLs (  100 mg total) by mouth every 4 (four) hours as needed for cough or to loosen phlegm. (Patient not taking: Reported on 11/18/2018), Disp: 236 mL, Rfl: 0 .  hydrochlorothiazide (HYDRODIURIL) 12.5 MG tablet, Take 12.5 mg by mouth every morning., Disp: , Rfl:  .  HYDROcodone-acetaminophen (NORCO/VICODIN) 5-325 MG tablet, TAKE 1/2-1 TABLET BY MOUTH EVERY 6 HOURS AS NEEDED FOR MODERATE PAIN, Disp: , Rfl:       Objective:   Vitals:   11/18/18 0901  BP: 104/64  Pulse: 74  SpO2: 90%  Weight: 90 lb 6.4 oz (41 kg)  Height: 5' (1.524 m)    Estimated body mass index is 17.66 kg/m as calculated from the following:   Height as of this encounter: 5' (1.524 m).   Weight as of this encounter: 90 lb 6.4 oz (41 kg).  @WEIGHTCHANGE @  [employer]   11/18/18 0901  Weight: 90 lb 6.4 oz (41 kg)     Physical Exam  General Appearance:    Alert, cooperative, no distress, appears stated age - yes , Deconditioned looking - no , OBESE  - no, Sitting on Wheelchair -  no  Head:    Normocephalic, without obvious abnormality, atraumatic  Eyes:    PERRL, conjunctiva/corneas clear,  Ears:    Normal TM's and external ear canals, both ears  Nose:   Nares normal,  septum midline, mucosa normal, no drainage    or sinus tenderness. OXYGEN ON  - yes . Patient is @ 2-3L   Throat:   Lips, mucosa, and tongue normal; teeth and gums normal. Cyanosis on lips - no  Neck:   Supple, symmetrical, trachea midline, no adenopathy;    thyroid:  no enlargement/tenderness/nodules; no carotid   bruit or JVD  Back:     Symmetric, no curvature, ROM normal, no CVA tenderness  Lungs:     Distress - no , Wheeze no, Barrell Chest - yes, Purse lip breathing - yes, Crackles - no   Chest Wall:    No tenderness or deformity.    Heart:    Regular rate and rhythm, S1 and S2 normal, no rub   or gallop, Murmur - no  Breast Exam:    NOT DONE  Abdomen:     Soft, non-tender, bowel sounds active all four quadrants,    no masses, no organomegaly. Visceral obesity - no  Genitalia:   NOT DONE  Rectal:   NOT DONE  Extremities:   Extremities - normal, Has Cane - no, Clubbing - no, Edema - no  Pulses:   2+ and symmetric all extremities  Skin:   Stigmata of Connective Tissue Disease - no  Lymph nodes:   Cervical, supraclavicular, and axillary nodes normal  Psychiatric:  Neurologic:   Pleasant - yes, Anxious - no, Flat affect - no  CAm-ICU - neg, Alert and Oriented x 3 - yes, Moves all 4s - yes, Speech - normal, Cognition - intact           Assessment:       ICD-10-CM   1. Chronic respiratory failure with hypoxia (HCC) J96.11   2. COPD GOLD II  J44.9        Plan:     Patient Instructions     ICD-10-CM   1. Chronic respiratory failure with hypoxia (HCC) J96.11   2. COPD GOLD II  J44.9     Copd Is stable  Plan Continue o2 and inahlers as before (stiolto and flovent) Albuterol as needed Avoid sick  contacts  If feeling sick, then call us and we can see about treating over phone v coming in  Followup 6 months or sooner     SIGNATURE    Dr. Brand Males, M.D., F.C.C.P,  Pulmonary and Critical Care Medicine Staff Physician, [place] Director  - Interstitial Lung Disease  Program  Pulmonary [facility] at [place], [place], [address]  Pager: [phone], If no answer or between  15:00h - 7:00h: call [phone] Telephone: [phone]  9:15 AM 11/18/2018

## 2018-12-03 ENCOUNTER — Telehealth: Payer: Self-pay | Admitting: Internal Medicine

## 2018-12-03 NOTE — Telephone Encounter (Signed)
Spoke with patient. She stated that she nearly choked on a pill Sunday morning. Since then, she has been coughing more. It is a non-productive, deep cough. Denied any problems with breathing (she currently uses 2-3L of O2 at home), no issues with swallowing since then and does not feel like her throat is closing up.  Denied having a fever or chills as well.   So far, she has been taking Mucinex DM twice daily, Robitussin 3 times a day, Flovent HFA twice daily and her albuterol neb solution as needed.   She wants to know if she needs to have something called in for the cough.   She wants to use [facility] on [address].   She is currently charging her phone, therefore the best number to reach her will be [phone]  MR, please advise. Thanks!

## 2018-12-03 NOTE — Telephone Encounter (Signed)
Called patient x2 unable to reach as phone was picked up both times and no one answered.

## 2018-12-03 NOTE — Telephone Encounter (Signed)
Just watch. IF cough gets worse over next few days or she has additional symptoms call back for more medicine

## 2018-12-04 NOTE — Telephone Encounter (Signed)
Called and spoke with patient, advised her of MR response. Patient stated her cough has increased and she is producing clear mucus. She does have congestion. Patient also complains of dizziness. Denies fever, no chills and no body aches.   TP please advise, thank you.

## 2018-12-04 NOTE — Telephone Encounter (Signed)
Called and spoke with patient she is aware of results and verbalized understanding. Patient has tessalon perles. Nothing further needed.

## 2018-12-04 NOTE — Telephone Encounter (Signed)
Continue with current regimen she is using per Dr. Chase Caller message and if not improving in next 24-48 hr please call back .   Would hold Fosamax for 2 weeks and then restart  Add Tessalon Three times a day  As needed  Cough   Please contact office for sooner follow up if symptoms do not improve or worsen or seek emergency care '

## 2018-12-04 NOTE — Telephone Encounter (Signed)
Pt is calling back [phone]

## 2019-01-06 ENCOUNTER — Other Ambulatory Visit: Payer: Self-pay | Admitting: Family Medicine

## 2019-01-06 ENCOUNTER — Ambulatory Visit
Admission: RE | Admit: 2019-01-06 | Discharge: 2019-01-06 | Disposition: A | Discharge status: Home / Self Care | Payer: Medicare Other | Source: Ambulatory Visit | Attending: Family Medicine | Admitting: Family Medicine

## 2019-01-06 DIAGNOSIS — R05 Cough: Secondary | ICD-10-CM

## 2019-01-06 DIAGNOSIS — S5001XA Contusion of right elbow, initial encounter: Secondary | ICD-10-CM

## 2019-01-06 DIAGNOSIS — R059 Cough, unspecified: Secondary | ICD-10-CM

## 2019-01-22 ENCOUNTER — Telehealth: Payer: Self-pay

## 2019-01-22 ENCOUNTER — Telehealth: Payer: Self-pay | Admitting: Internal Medicine

## 2019-01-22 MED ORDER — BENZONATATE 100 MG PO CAPS: 100.0000 mg | ORAL_CAPSULE | Freq: Four times a day (QID) | ORAL | 1 refills | Status: AC | PRN

## 2019-01-22 MED ORDER — BENZONATATE 100 MG PO CAPS: 100.0000 mg | ORAL_CAPSULE | Freq: Three times a day (TID) | ORAL | 1 refills | Status: DC

## 2019-01-22 NOTE — Telephone Encounter (Signed)
Called patient to switch her upcoming appointment to a virtual visit and to discuss the ability to obtain vitals and the consent for the telehealth visit. The patient is excited to try a video visit with her doctor. I also informed the patient that she will also receive other calls from the office to prepare her for the visit.

## 2019-01-22 NOTE — Telephone Encounter (Signed)
Called & spoke w/ pt in regards to TN's recommendations. Pt verbalized understanding with no additional questions. Tessalon perles have been sent by TN. Nothing further needed at this time.

## 2019-01-22 NOTE — Telephone Encounter (Signed)
Primary Pulmonologist: MR Last office visit and with whom: 11/18/2018 with MR What do we see them for (pulmonary problems):  COPD Last OV assessment/plan: Instructions     ICD-10-CM   1. Chronic respiratory failure with hypoxia (HCC) J96.11   2. COPD GOLD II  J44.9     Copd Is stable  Plan Continue o2 and inahlers as before (stiolto and flovent) Albuterol as needed Avoid sick contacts  If feeling sick, then call us and we can see about treating over phone v coming in  Followup 6 months or sooner      Was appointment offered to patient (explain)?  Pt requesting recommendations   Reason for call: Called and spoke with pt who stated she developed a cough after pneumonia around Christmas time. Pt has had a cough since February 2020 and has been on three different rounds of abx and prednisone which was prescribed by PCP. Pt is coughing up clear mucus.   Pt denies any complaints of increased SOB due to being on O2 and states her O2 sats have been ranging from 94-95% on 2L O2.  Pt denies any complaints of fever, body aches or chills but pt states that she does become dizzy every now and then.  Pt has taken Delsym and is currently taking mucinex DM and was on tessalon which she stated she took the last of her Rx this morning 5/6.  Due to the multiple rounds of abx and prednisone, pt wants to know recommendations to help with symptoms. Since Kenney Houseman was NP at office that saw pt last, sending to [place]. Kenney Houseman, please advise recs for pt. Thanks!

## 2019-01-22 NOTE — Telephone Encounter (Signed)
Virtual Visit Pre-Appointment Phone Call  "(Name), I am calling you today to discuss your upcoming appointment. We are currently trying to limit exposure to the virus that causes COVID-19 by seeing patients at home rather than in the office."  1. "What is the BEST phone number to call the day of the visit?" - include this in appointment notes  2. "Do you have or have access to (through a family member/friend) a smartphone with video capability that we can use for your visit?" a. If yes - list this number in appt notes as "cell" (if different from BEST phone #) and list the appointment type as a VIDEO visit in appointment notes b. If no - list the appointment type as a PHONE visit in appointment notes  3. Confirm consent - "In the setting of the current Covid19 crisis, you are scheduled for a VIDEO visit with your provider on 01/28/2019 at 8:00AM.  Just as we do with many in-office visits, in order for you to participate in this visit, we must obtain consent.  If you'd like, I can send this to your mychart (if signed up) or email for you to review.  Otherwise, I can obtain your verbal consent now.  All virtual visits are billed to your insurance company just like a normal visit would be.  By agreeing to a virtual visit, we'd like you to understand that the technology does not allow for your provider to perform an examination, and thus may limit your provider's ability to fully assess your condition. If your provider identifies any concerns that need to be evaluated in person, we will make arrangements to do so.  Finally, though the technology is pretty good, we cannot assure that it will always work on either your or our end, and in the setting of a video visit, we may have to convert it to a phone-only visit.  In either situation, we cannot ensure that we have a secure connection.  Are you willing to proceed?" STAFF: Did the patient verbally acknowledge consent to telehealth visit? Document YES/NO  here: YES  4. Advise patient to be prepared - "Two hours prior to your appointment, go ahead and check your blood pressure, pulse, oxygen saturation, and your weight (if you have the equipment to check those) and write them all down. When your visit starts, your provider will ask you for this information. If you have an Apple Watch or Kardia device, please plan to have heart rate information ready on the day of your appointment. Please have a pen and paper handy nearby the day of the visit as well."  5. Give patient instructions for MyChart download to smartphone OR Doximity/Doxy.me as below if video visit (depending on what platform provider is using)  6. Inform patient they will receive a phone call 15 minutes prior to their appointment time (may be from unknown caller ID) so they should be prepared to answer    TELEPHONE CALL NOTE  [name] has been deemed a candidate for a follow-up tele-health visit to limit community exposure during the Covid-19 pandemic. I spoke with the patient via phone to ensure availability of phone/video source, confirm preferred email & phone number, and discuss instructions and expectations.  I reminded [name] to be prepared with any vital sign and/or heart rhythm information that could potentially be obtained via home monitoring, at the time of her visit. I reminded [name] to expect a phone call prior to her visit.  [name], [place] 01/22/2019 2:14 PM   INSTRUCTIONS FOR DOWNLOADING THE MYCHART APP TO SMARTPHONE  - The patient must first make sure to have activated MyChart and know their login information - If Apple, go to [employer] and type in MyChart in the search bar and download the app. If Android, ask patient to go to [employer] and type in [place] in the search bar and download the app. The app is free but as with any other app downloads, their phone may require them to verify saved payment information or Apple/Android  password.  - The patient will need to then log into the app with their MyChart username and password, and select [facility] as their healthcare provider to link the account. When it is time for your visit, go to the MyChart app, find appointments, and click Begin Video Visit. Be sure to Select Allow for your device to access the Microphone and Camera for your visit. You will then be connected, and your provider will be with you shortly.  **If they have any issues connecting, or need assistance please contact MyChart service desk [phone])**  **If using a computer, in order to ensure the best quality for their visit they will need to use either of the following Internet Browsers: [employer], or Google Chrome**  IF USING DOXIMITY or DOXY.ME - The patient will receive a link just prior to their visit by text.     FULL LENGTH CONSENT FOR TELE-HEALTH VISIT   I hereby voluntarily request, consent and authorize [facility] and its employed or contracted physicians, physician assistants, nurse practitioners or other licensed health care professionals (the Practitioner), to provide me with telemedicine health care services (the "Services") as deemed necessary by the treating Practitioner. I acknowledge and consent to receive the Services by the Practitioner via telemedicine. I understand that the telemedicine visit will involve communicating with the Practitioner through live audiovisual communication technology and the disclosure of certain medical information by electronic transmission. I acknowledge that I have been given the opportunity to request an in-person assessment or other available alternative prior to the telemedicine visit and am voluntarily participating in the telemedicine visit.  I understand that I have the right to withhold or withdraw my consent to the use of telemedicine in the course of my care at any time, without affecting my right to future care or treatment,  and that the Practitioner or I may terminate the telemedicine visit at any time. I understand that I have the right to inspect all information obtained and/or recorded in the course of the telemedicine visit and may receive copies of available information for a reasonable fee.  I understand that some of the potential risks of receiving the Services via telemedicine include:  [name] Delay or interruption in medical evaluation due to technological equipment failure or disruption; . Information transmitted may not be sufficient (e.g. poor resolution of images) to allow for appropriate medical decision making by the Practitioner; and/or  . In rare instances, security protocols could fail, causing a breach of personal health information.  Furthermore, I acknowledge that it is my responsibility to provide information about my medical history, conditions and care that is complete and accurate to the best of my ability. I acknowledge that Practitioner's advice, recommendations, and/or decision may be based on factors not within their control, such as incomplete or inaccurate data provided by me or distortions of diagnostic images or specimens that may result from electronic transmissions. I understand that the  practice of medicine is not an Chief Strategy Officer and that Practitioner makes no warranties or guarantees regarding treatment outcomes. I acknowledge that I will receive a copy of this consent concurrently upon execution via email to the email address I last provided but may also request a printed copy by calling the office of [facility].    I understand that my insurance will be billed for this visit.   I have read or had this consent read to me. . I understand the contents of this consent, which adequately explains the benefits and risks of the Services being provided via telemedicine.  . I have been provided ample opportunity to ask questions regarding this consent and the Services and have had my questions  answered to my satisfaction. . I give my informed consent for the services to be provided through the use of telemedicine in my medical care  By participating in this telemedicine visit I agree to the above.

## 2019-01-22 NOTE — Telephone Encounter (Signed)
I will refill tessalon perles. Please take maintenance inhalers as ordered. If symptoms worsen please call back and we may need to order a chest x ray. Thanks.

## 2019-01-24 NOTE — Progress Notes (Signed)
Virtual Visit via Video Note   This visit type was conducted due to national recommendations for restrictions regarding the COVID-19 Pandemic (e.g. social distancing) in an effort to limit this patient's exposure and mitigate transmission in our community.  Due to her co-morbid illnesses, this patient is at least at moderate risk for complications without adequate follow up.  This format is felt to be most appropriate for this patient at this time.  All issues noted in this document were discussed and addressed.  A limited physical exam was performed with this format.  Please refer to the patient's chart for her consent to telehealth for [facility].   Date:  01/28/2019   ID:  [name], DOB 09-Mar-1932, MRN [id number]  Patient Location: Home Provider Location: Home  PCP:  Harlan Stains, MD  Cardiologist:  Dr Stanford Breed  Evaluation Performed:  Follow-Up Visit  Chief Complaint:  FU AVR  History of Present Illness:    FU aortic stenosis/s/p AVR and CAD. Patient underwent cardiac catheterization in October of 2014. She was found to have a 70% LAD and there was a 40-50% mid RCA. In October of 2014 the patient underwent aortic valve replacement with a pericardial valve and coronary artery bypassing graft with a LIMA to the LAD. Echo 2/17 showed normal LV function, bioprosthetic aortic valve with trace AI.  Carotid Dopplers February 2018 showed 1-39% bilateral stenosis.  Last echocardiogram April 2019 showed normal LV function, mild diastolic dysfunction, prior bioprosthetic aortic valve replacement with mean gradient 19 mmHg, no aortic insufficiency, mild mitral regurgitation and mild left atrial enlargement.  Since I last saw her,patient denies worsening dyspnea, chest pain or syncope.  She has had problems with dizziness with standing.  No palpitations.  No pedal edema.  The patient does not have symptoms concerning for COVID-19 infection (fever, chills, cough, or new shortness of  breath).    Past Medical History:  Diagnosis Date  . Anxiety   . Aortic stenosis   . Arthritis   . Asthma    uses inhaler as needed  . Depression   . Diabetes mellitus   . Emphysema of lung ([place])   . GERD (gastroesophageal reflux disease)   . Glaucoma   . Hyperlipidemia   . Hypertension   . [name]   . Murmur   . Nephrolithiasis   . Pneumonia 3/17 and 3/18   Past Surgical History:  Procedure Laterality Date  . ABDOMINAL HYSTERECTOMY    . ANKLE SURGERY  1998   d/t fx.  Left ankle  . ANTERIOR AND POSTERIOR REPAIR  06/21/2011   Procedure: ANTERIOR (CYSTOCELE) AND POSTERIOR REPAIR (RECTOCELE);  Surgeon: Maisie Fus;  Location: [facility];  Service: Gynecology;  Laterality: N/A;  . AORTIC VALVE REPLACEMENT N/A 07/17/2013   Procedure: AORTIC VALVE REPLACEMENT (AVR);  Surgeon: Ivin Poot, MD;  Location: [facility];  Service: Open Heart Surgery;  Laterality: N/A;  . CARDIAC CATHETERIZATION    . CARDIAC VALVE REPLACEMENT    . CORONARY ANGIOPLASTY    . CORONARY ARTERY BYPASS GRAFT N/A 07/17/2013   Procedure: CORONARY ARTERY BYPASS GRAFT, TIMES ONE, ON PUMP, USING RIGHT INTERNAL MAMMARY ARTERY.;  Surgeon: Ivin Poot, MD;  Location: [facility];  Service: Open Heart Surgery;  Laterality: N/A;  . FACIAL COSMETIC SURGERY     face lift  . INTRAOPERATIVE TRANSESOPHAGEAL ECHOCARDIOGRAM N/A 07/17/2013   Procedure: INTRAOPERATIVE TRANSESOPHAGEAL ECHOCARDIOGRAM;  Surgeon: Ivin Poot, MD;  Location: [facility];  Service: Open  Heart Surgery;  Laterality: N/A;  . LAPAROSCOPIC ASSISTED VAGINAL HYSTERECTOMY  06/21/2011   Procedure: LAPAROSCOPIC ASSISTED VAGINAL HYSTERECTOMY;  Surgeon: Maisie Fus;  Location: [facility];  Service: Gynecology;  Laterality: N/A;  . LEFT AND RIGHT HEART CATHETERIZATION WITH CORONARY ANGIOGRAM N/A 06/27/2013   Procedure: LEFT AND RIGHT HEART CATHETERIZATION WITH CORONARY ANGIOGRAM;  Surgeon: Ramond Dial, MD;  Location: [facility];  Service:  Cardiovascular;  Laterality: N/A;  . NOSE SURGERY    . SALPINGOOPHORECTOMY  06/21/2011   Procedure: SALPINGO OOPHERECTOMY;  Surgeon: Maisie Fus;  Location: [facility];  Service: Gynecology;  Laterality: Bilateral;  . VAGINAL PROLAPSE REPAIR  06/21/2011   Procedure: SACROPEXY;  Surgeon: Maisie Fus;  Location: [facility];  Service: Gynecology;  Laterality: N/A;  sacrospinous ligament suspension     Current Meds  Medication Sig  . acetaminophen (TYLENOL) 500 MG tablet Take 500 mg by mouth every 6 (six) hours as needed for headache.  . albuterol (PROVENTIL) (2.5 MG/3ML) 0.083% nebulizer solution Take 3 mLs (2.5 mg total) by nebulization every 6 (six) hours as needed for wheezing.  [name] alendronate (FOSAMAX) 70 MG tablet Take 70 mg by mouth once a week. Take with a full glass of water on an empty stomach.  [name] amLODipine (NORVASC) 10 MG tablet Take 1 tablet (10 mg total) by mouth daily.  Jearl Klinefelter ELLIPTA 62.5-25 MCG/INH AEPB USE 1 INHALATION BY MOUTH  DAILY  . aspirin EC 81 MG tablet Take 81 mg by mouth at bedtime.  . benzonatate (TESSALON) 100 MG capsule Take 1 capsule (100 mg total) by mouth every 6 (six) hours as needed for up to 30 days for cough.  [name] buPROPion (WELLBUTRIN XL) 150 MG 24 hr tablet Take 150 mg by mouth daily.  . cholecalciferol (VITAMIN D) 1000 units tablet Take 1,000 Units by mouth at bedtime.   [name] dextromethorphan-guaiFENesin (MUCINEX DM) 30-600 MG 12hr tablet Take 1 tablet by mouth 2 (two) times daily.  Mariane Baumgarten Sodium (COLACE PO) Take 100 mg by mouth 2 (two) times daily.   . feeding supplement, GLUCERNA SHAKE, (GLUCERNA SHAKE) LIQD Take 237 mLs by mouth 3 (three) times daily between meals.  . Ferrous Sulfate (IRON) 325 (65 Fe) MG TABS Take 325 mg by mouth 2 (two) times daily.   . fluticasone (FLOVENT HFA) 220 MCG/ACT inhaler Inhale 2 puffs into the lungs 2 (two) times daily.  . furosemide (LASIX) 20 MG tablet Take 1 tablet (20 mg total) by mouth once a week.  . hydrochlorothiazide  (HYDRODIURIL) 12.5 MG tablet Take 12.5 mg by mouth every morning.  . irbesartan (AVAPRO) 300 MG tablet Take 300 mg by mouth daily.   . Magnesium 250 MG TABS Take 250 mg by mouth at bedtime.   [name] menthol-cetylpyridinium (CEPACOL) 3 MG lozenge Take 1 lozenge (3 mg total) by mouth as needed for sore throat.  . metFORMIN (GLUCOPHAGE-XR) 500 MG 24 hr tablet TAKE 1 TABLET BY MOUTH EVERY DAY IN THE EVENING WITH DINNER  . Multiple Vitamins-Minerals (OCUVITE EYE HEALTH FORMULA PO) Take 1 capsule by mouth daily.   . pravastatin (PRAVACHOL) 40 MG tablet Take 40 mg by mouth every evening.   [name] PROAIR HFA 108 (90 BASE) MCG/ACT inhaler Inhale 2 puffs into the lungs 4 (four) times daily as needed for wheezing or shortness of breath.   . sertraline (ZOLOFT) 100 MG tablet Take 1 tablet (100 mg total) by mouth every morning.  . Tiotropium Bromide-Olodaterol (STIOLTO RESPIMAT) 2.5-2.5 MCG/ACT  AERS Inhale 2 puffs into the lungs daily.     Allergies:   Lipitor [atorvastatin]; Lisinopril; Amaryl [glimepiride]; Anoro ellipta [umeclidinium-vilanterol]; Avelox [moxifloxacin hcl in nacl]; Ciprofloxacin; Other; Prevacid [lansoprazole]; Spiriva handihaler [tiotropium bromide monohydrate]; and Symbicort [budesonide-formoterol fumarate]   Social History   Tobacco Use  . Smoking status: Former Smoker    Packs/day: 2.00    Years: 15.00    Pack years: 30.00    Types: Cigarettes    Last attempt to quit: 09/18/1972    Years since quitting: 46.3  . Smokeless tobacco: Never Used  Substance Use Topics  . Alcohol use: No    Alcohol/week: 0.0 standard drinks  . Drug use: No     Family Hx: The patient's family history includes Emphysema in her father. There is no history of Colon cancer, Esophageal cancer, Rectal cancer, or Stomach cancer.  ROS:   Please see the history of present illness.    No fevers, chills or productive cough. All other systems reviewed and are negative.   Recent Labs: 09/06/2018: ALT 39  09/14/2018: Magnesium 1.9 09/20/2018: BUN 43; Creatinine, Ser 1.33; Hemoglobin 9.0; Platelets 189; Potassium 5.0; Sodium 137   Wt Readings from Last 3 Encounters:  01/28/19 86 lb (39 kg)  11/18/18 90 lb 6.4 oz (41 kg)  09/30/18 93 lb 3.2 oz (42.3 kg)     Objective:    Vital Signs:  BP (!) 149/88   Pulse 81   Ht 5' (1.524 m)   Wt 86 lb (39 kg)   SpO2 94%   BMI 16.80 kg/m    VITAL SIGNS:  reviewed  No acute distress Normal affect Answers questions appropriately Remainder of physical examination not performed (telehealth visit; coronavirus pandemic)  ASSESSMENT & PLAN:    1. Coronary artery disease status post coronary artery bypass graft-patient doing well with no chest pain.  Continue medical therapy with aspirin and statin. 2. Hypertension-patient's blood pressure is elevated but she is unclear if her cuff is accurate as she states it is typically normal in physician's offices.  I have asked her to bring her cuff to next office visit and we will correlate with ours to ensure accuracy.  She is having some difficulties with orthostatic symptoms.  Decrease hydrochlorothiazide to 12.5 mg daily.  Discontinue Lasix.  Check potassium and renal function in 2 weeks. 3. Hyperlipidemia-continue statin.  Check lipids and liver. 4. Prior aortic valve replacement-continue SBE prophylaxis. 5. Carotid artery disease-mild on most recent Dopplers.  Plan to continue medical therapy with aspirin and statin. 6. History of syncope-no recurrent episodes when wearing oxygen.  COVID-19 Education: The importance of social distancing was discussed today.  Time:   Today, I have spent 15 minutes with the patient with telehealth technology discussing the above problems.     Medication Adjustments/Labs and Tests Ordered: Current medicines are reviewed at length with the patient today.  Concerns regarding medicines are outlined above.   Tests Ordered: No orders of the defined types were placed in this  encounter.   Medication Changes: No orders of the defined types were placed in this encounter.   Disposition:  Follow up in 6 month(s)  Signed, Kirk Ruths, MD  01/28/2019 8:03 AM    [place]

## 2019-01-28 ENCOUNTER — Telehealth (INDEPENDENT_AMBULATORY_CARE_PROVIDER_SITE_OTHER): Payer: Medicare Other | Admitting: Cardiology

## 2019-01-28 VITALS — BP 149/88 | HR 81 | Ht 60.0 in | Wt 86.0 lb

## 2019-01-28 DIAGNOSIS — Z952 Presence of prosthetic heart valve: Secondary | ICD-10-CM

## 2019-01-28 DIAGNOSIS — I10 Essential (primary) hypertension: Secondary | ICD-10-CM

## 2019-01-28 DIAGNOSIS — E7800 Pure hypercholesterolemia, unspecified: Secondary | ICD-10-CM

## 2019-01-28 DIAGNOSIS — I2510 Atherosclerotic heart disease of native coronary artery without angina pectoris: Secondary | ICD-10-CM | POA: Diagnosis not present

## 2019-01-28 NOTE — Patient Instructions (Signed)
Medication Instructions:  STOP FUROSEMIDE  DECREASE HCTZ TO 12.5 MG ONCE DAILY If you need a refill on your cardiac medications before your next appointment, please call your pharmacy.   Lab work: Your physician recommends that you return for lab work in: [facility] If you have labs (blood work) drawn today and your tests are completely normal, you will receive your results only by: [name] MyChart Message (if you have MyChart) OR . A paper copy in the mail If you have any lab test that is abnormal or we need to change your treatment, we will call you to review the results.  Follow-Up: At [facility], you and your health needs are our priority.  As part of our continuing mission to provide you with exceptional heart care, we have created designated Provider Care Teams.  These Care Teams include your primary Cardiologist (physician) and Advanced Practice Providers (APPs -  Physician Assistants and Nurse Practitioners) who all work together to provide you with the care you need, when you need it. You will need a follow up appointment in 6 months.  Please call our office 2 months in advance to schedule this appointment.  You may see Kirk Ruths MD or one of the following Advanced Practice Providers on your designated Care Team:   Kerin Ransom, PA-C Roby Lofts, [place] . Sande Rives, PA-C

## 2019-01-30 LAB — COMPREHENSIVE METABOLIC PANEL
ALT: 31 IU/L (ref 0–32)
AST: 31 IU/L (ref 0–40)
Albumin/Globulin Ratio: 1.8 (ref 1.2–2.2)
Albumin: 4.4 g/dL (ref 3.6–4.6)
Alkaline Phosphatase: 82 IU/L (ref 39–117)
BUN/Creatinine Ratio: 25 (ref 12–28)
BUN: 26 mg/dL (ref 8–27)
Bilirubin Total: 0.2 mg/dL (ref 0.0–1.2)
CO2: 24 mmol/L (ref 20–29)
Calcium: 9.9 mg/dL (ref 8.7–10.3)
Chloride: 104 mmol/L (ref 96–106)
Creatinine, Ser: 1.04 mg/dL — ABNORMAL HIGH (ref 0.57–1.00)
GFR calc Af Amer: 57 mL/min/{1.73_m2} — ABNORMAL LOW (ref >59)
GFR calc non Af Amer: 49 mL/min/{1.73_m2} — ABNORMAL LOW (ref >59)
Globulin, Total: 2.4 g/dL (ref 1.5–4.5)
Glucose: 150 mg/dL — ABNORMAL HIGH (ref 65–99)
Potassium: 4.6 mmol/L (ref 3.5–5.2)
Sodium: 141 mmol/L (ref 134–144)
Total Protein: 6.8 g/dL (ref 6.0–8.5)

## 2019-01-30 LAB — LIPID PANEL
Chol/HDL Ratio: 2.1 ratio (ref 0.0–4.4)
Cholesterol, Total: 179 mg/dL (ref 100–199)
HDL: 84 mg/dL (ref >39)
LDL Calculated: 73 mg/dL (ref 0–99)
Triglycerides: 108 mg/dL (ref 0–149)
VLDL Cholesterol Cal: 22 mg/dL (ref 5–40)

## 2019-01-31 ENCOUNTER — Encounter: Payer: Self-pay | Admitting: *Deleted

## 2019-02-05 ENCOUNTER — Telehealth: Payer: Self-pay | Admitting: *Deleted

## 2019-02-05 NOTE — Telephone Encounter (Signed)
CALLED PATIENT TO ALTER FU APPT. PER DR. KINARD- APPT. RESCHEDULED FOR 02-06-19 @ 10 AM, PATIENT AGREED TO NEW TIME

## 2019-02-06 ENCOUNTER — Ambulatory Visit
Admission: RE | Admit: 2019-02-06 | Discharge: 2019-02-06 | Disposition: A | Discharge status: Home / Self Care | Payer: Medicare Other | Source: Ambulatory Visit | Attending: Radiation Oncology | Admitting: Radiation Oncology

## 2019-02-06 ENCOUNTER — Ambulatory Visit (HOSPITAL_COMMUNITY)
Admission: RE | Admit: 2019-02-06 | Discharge: 2019-02-06 | Disposition: A | Discharge status: Home / Self Care | Payer: Medicare Other | Source: Ambulatory Visit | Attending: Radiation Oncology | Admitting: Radiation Oncology

## 2019-02-06 ENCOUNTER — Other Ambulatory Visit: Payer: Self-pay

## 2019-02-06 ENCOUNTER — Encounter: Payer: Self-pay | Admitting: Radiation Oncology

## 2019-02-06 VITALS — BP 167/80 | HR 73 | Temp 97.9°F | Resp 20 | Ht 60.0 in | Wt 87.5 lb

## 2019-02-06 DIAGNOSIS — Z79899 Other long term (current) drug therapy: Secondary | ICD-10-CM | POA: Insufficient documentation

## 2019-02-06 DIAGNOSIS — R911 Solitary pulmonary nodule: Secondary | ICD-10-CM | POA: Diagnosis not present

## 2019-02-06 DIAGNOSIS — Z7982 Long term (current) use of aspirin: Secondary | ICD-10-CM | POA: Diagnosis not present

## 2019-02-06 DIAGNOSIS — K449 Diaphragmatic hernia without obstruction or gangrene: Secondary | ICD-10-CM | POA: Diagnosis not present

## 2019-02-06 DIAGNOSIS — Z923 Personal history of irradiation: Secondary | ICD-10-CM | POA: Insufficient documentation

## 2019-02-06 DIAGNOSIS — J432 Centrilobular emphysema: Secondary | ICD-10-CM | POA: Diagnosis not present

## 2019-02-06 DIAGNOSIS — Z7984 Long term (current) use of oral hypoglycemic drugs: Secondary | ICD-10-CM | POA: Insufficient documentation

## 2019-02-06 DIAGNOSIS — R05 Cough: Secondary | ICD-10-CM | POA: Diagnosis not present

## 2019-02-06 NOTE — Progress Notes (Signed)
Radiation Oncology         [phone] ________________________________  Name: [name] MRN: [id number]  Date: 02/06/2019  DOB: 21-Oct-1931  Follow-Up Visit Note  CC: Harlan Stains, MD  Brand Males, MD    ICD-10-CM   1. Pulmonary nodule, left R91.1 CT Chest Wo Contrast    Diagnosis:   85 y.o. female with Presumptive clinical stage 1 non-small cell lung cancerpresentingin the left lower lung  Interval Since Last Radiation:  1 year 1 month  Radiation treatment dates:12/21/17-12/31/17  Site/dose:Left lung / 50 Gy in 5 fractions (SBRT)  Narrative:  The patient returns today for routine follow-up.  We recommended delaying follow-up in light of the COVID-19 epidemic.  She however desired following up in the clinic given her persistent coughing. Her last chest CT, performed 08/28/2018, showed interval increase in bandlike opacity in the peripheral left lower lobe, incorporating the irregular nodule seen on the prior study, felt to most likely reflect changes from radiation therapy. She developed productive cough and shortness of breath one week later and was found to have left-sided pneumothorax with pneumonia.  On review of systems, the patient denies any pain. She complains of bumps along sternum area. She reports chronic, frequent cough x2 months with occasional clear mucus production. She denies hemoptysis or pain in the chest area. She denies any chills or fever. She denies headaches, dizziness, or blurred vision. She reports occasional difficulty swallowing (medications only). She is on 2 lpm supplemental oxygen. She is on 3 lpm at home.           ALLERGIES:  is allergic to lipitor [atorvastatin]; lisinopril; amaryl [glimepiride]; anoro ellipta [umeclidinium-vilanterol]; avelox [moxifloxacin hcl in nacl]; ciprofloxacin; other; prevacid [lansoprazole]; spiriva handihaler [tiotropium bromide monohydrate]; and symbicort [budesonide-formoterol fumarate].  Meds: Current  Outpatient Medications  Medication Sig Dispense Refill  . acetaminophen (TYLENOL) 500 MG tablet Take 500 mg by mouth every 6 (six) hours as needed for headache.    . albuterol (PROVENTIL) (2.5 MG/3ML) 0.083% nebulizer solution Take 3 mLs (2.5 mg total) by nebulization every 6 (six) hours as needed for wheezing. 75 mL 0  . alendronate (FOSAMAX) 70 MG tablet Take 70 mg by mouth once a week. Take with a full glass of water on an empty stomach.    [name] amLODipine (NORVASC) 10 MG tablet Take 1 tablet (10 mg total) by mouth daily. 90 tablet 3  . ANORO ELLIPTA 62.5-25 MCG/INH AEPB USE 1 INHALATION BY MOUTH  DAILY 180 each 0  . aspirin EC 81 MG tablet Take 81 mg by mouth at bedtime.    . benzonatate (TESSALON) 100 MG capsule Take 1 capsule (100 mg total) by mouth every 6 (six) hours as needed for up to 30 days for cough. 30 capsule 1  . buPROPion (WELLBUTRIN XL) 150 MG 24 hr tablet Take 150 mg by mouth daily.  2  . cholecalciferol (VITAMIN D) 1000 units tablet Take 1,000 Units by mouth at bedtime.     [name] dextromethorphan-guaiFENesin (MUCINEX DM) 30-600 MG 12hr tablet Take 1 tablet by mouth 2 (two) times daily. 60 tablet 0  . Docusate Sodium (COLACE PO) Take 100 mg by mouth 2 (two) times daily.     . feeding supplement, GLUCERNA SHAKE, (GLUCERNA SHAKE) LIQD Take 237 mLs by mouth 3 (three) times daily between meals. 30 Can 0  . Ferrous Sulfate (IRON) 325 (65 Fe) MG TABS Take 325 mg by mouth 2 (two) times daily.     . fluticasone (FLOVENT HFA)  220 MCG/ACT inhaler Inhale 2 puffs into the lungs 2 (two) times daily.    . hydrochlorothiazide (HYDRODIURIL) 12.5 MG tablet Take 12.5 mg by mouth every morning.    . irbesartan (AVAPRO) 300 MG tablet Take 300 mg by mouth daily.     . Magnesium 250 MG TABS Take 250 mg by mouth at bedtime.     [name] menthol-cetylpyridinium (CEPACOL) 3 MG lozenge Take 1 lozenge (3 mg total) by mouth as needed for sore throat. 100 tablet 12  . metFORMIN (GLUCOPHAGE-XR) 500 MG 24 hr tablet TAKE 1  TABLET BY MOUTH EVERY DAY IN THE EVENING WITH DINNER    . Multiple Vitamins-Minerals (OCUVITE EYE HEALTH FORMULA PO) Take 1 capsule by mouth daily.     . pravastatin (PRAVACHOL) 40 MG tablet Take 40 mg by mouth every evening.     [name] PROAIR HFA 108 (90 BASE) MCG/ACT inhaler Inhale 2 puffs into the lungs 4 (four) times daily as needed for wheezing or shortness of breath.     . sertraline (ZOLOFT) 100 MG tablet Take 1 tablet (100 mg total) by mouth every morning. 30 tablet 0  . Tiotropium Bromide-Olodaterol (STIOLTO RESPIMAT) 2.5-2.5 MCG/ACT AERS Inhale 2 puffs into the lungs daily. 12 g 1   No current facility-administered medications for this encounter.     Physical Findings: The patient is in no acute distress. Patient is alert and oriented.  height is 5' (1.524 m) and weight is 87 lb 8 oz (39.7 kg). Her temporal temperature is 97.9 F (36.6 C). Her blood pressure is 167/80 (abnormal) and her pulse is 73. Her respiration is 20 and oxygen saturation is 94%.   Lungs are clear to auscultation bilaterally. Heart has regular rate and rhythm. No palpable cervical, supraclavicular, or axillary adenopathy. Abdomen soft, non-tender, normal bowel sounds. Patient has oxygen in place at 2 L.  Patient has prominent sternum and ribs in light of thin body habitus. No palpable mass.  Lab Findings: Lab Results  Component Value Date   WBC 16.1 (H) 09/20/2018   HGB 9.0 (L) 09/20/2018   HCT 28.6 (L) 09/20/2018   MCV 91.1 09/20/2018   PLT 189 09/20/2018    Radiographic Findings: Ct Chest Wo Contrast  Result Date: 02/06/2019 CLINICAL DATA:  Non-small-cell lung cancer. EXAM: CT CHEST WITHOUT CONTRAST TECHNIQUE: Multidetector CT imaging of the chest was performed following the standard protocol without IV contrast. COMPARISON:  08/28/2018 FINDINGS: Cardiovascular: The heart size is normal. No substantial pericardial effusion. Coronary artery calcification is evident. Atherosclerotic calcification is noted in  the wall of the thoracic aorta. Prominent pulmonary arteries suggests pulmonary arterial hypertension. Mediastinum/Nodes: No mediastinal lymphadenopathy. No evidence for gross hilar lymphadenopathy although assessment is limited by the lack of intravenous contrast on today's study. Moderate to large hiatal hernia. There is no axillary lymphadenopathy. Lungs/Pleura: The central tracheobronchial airways are patent. Centrilobular and paraseptal emphysema evident. Prominent bullous change noted both upper lobes. A new 7 x 8 mm subpleural nodule is identified at the right lung base (103/7 and coronal image 66/5). Other focal subpleural opacities in the right lower lobe are more characteristic for atelectasis. 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 Upper Abdomen: Unremarkable. Musculoskeletal: No worrisome lytic or sclerotic  osseous abnormality. Superior endplate compression deformity at T12 is similar to prior. IMPRESSION: 1. 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 Close attention recommended as recurrent disease not excluded. 2. New scattered subpleural opacities in the right lung base, 1 of which has a nodular configuration. These likely reflect areas of atelectasis but close follow-up recommended. 3. Advanced emphysema with bullous change in the upper lungs. 4. Interval development of high attenuation pleural thickening posterior left hemithorax consistent with the history of talc pleurodesis. Electronically Signed   By: Misty Stanley M.D.   On: 02/06/2019 12:38    Impression:   Presumptive clinical stage 1 non-small cell lung cancerpresentingin the left lower lung. Clinically stable. Patient will proceed with a CT scan of the chest later today. I recommended Humibid LA but the patient has already tried this medication. I've suggested she try Robitussin DM for her cough.  Plan:  Patient will proceed with a CT scan of the chest without contrast later today. Routine follow up in 6 months unless chest CT scan shows something concerning.   Addendum: Patient was informed of her chest CT scan findings. Suggested deep breathing exercises with atelectasis. ________________  Blair Promise, PhD, MD  This document serves as a record of services personally performed by Gery Pray, MD. It was created on his behalf by Rae Lips, a trained medical scribe. The creation of this record is based on the scribe's personal observations and the provider's statements to them. This document has been checked and approved by the attending provider.

## 2019-02-06 NOTE — Progress Notes (Signed)
Pt presents today for f/u with Dr. Sondra Come. Pt denies c/o pain. Pt with frequent cough. Occasional clear mucus production. No hemoptysis. Pt reports occasional difficulty swallowing with medications only. Pt on 2 lpm supplemental oxygen. Pt is on 3 lpm at home.   BP (!) 167/80 (BP Location: Left Arm, Patient Position: Sitting)   Pulse 73   Temp 97.9 F (36.6 C) (Temporal)   Resp 20   Ht 5' (1.524 m)   Wt 87 lb 8 oz (39.7 kg)   SpO2 94%   BMI 17.09 kg/m   Wt Readings from Last 3 Encounters:  02/06/19 87 lb 8 oz (39.7 kg)  01/28/19 86 lb (39 kg)  11/18/18 90 lb 6.4 oz (41 kg)   Loma Sousa, RN BSN

## 2019-02-06 NOTE — Patient Instructions (Signed)
Coronavirus (COVID-19) Are you at risk?  Are you at risk for the Coronavirus (COVID-19)?  To be considered HIGH RISK for Coronavirus (COVID-19), you have to meet the following criteria:  . Traveled to [place], [place], [place], [place] or [place]; or in the [place] to [place], [place], [place], or [place]; and have fever, cough, and shortness of breath within the last 2 weeks of travel OR . Been in close contact with a person diagnosed with COVID-19 within the last 2 weeks and have fever, cough, and shortness of breath . IF YOU DO NOT MEET THESE CRITERIA, YOU ARE CONSIDERED LOW RISK FOR COVID-19.  What to do if you are HIGH RISK for COVID-19?  . If you are having a medical emergency, call 911. . Seek medical care right away. Before you go to a doctor's office, urgent care or emergency department, call ahead and tell them about your recent travel, contact with someone diagnosed with COVID-19, and your symptoms. You should receive instructions from your physician's office regarding next steps of care.  . When you arrive at healthcare provider, tell the healthcare staff immediately you have returned from visiting [place], [place], [place], [place] or [place]; or traveled in the [place] to [place], [place], [place], or [place]; in the last two weeks or you have been in close contact with a person diagnosed with COVID-19 in the last 2 weeks.   . Tell the health care staff about your symptoms: fever, cough and shortness of breath. . After you have been seen by a medical provider, you will be either: o Tested for (COVID-19) and discharged home on quarantine except to seek medical care if symptoms worsen, and asked to  - Stay home and avoid contact with others until you get your results (4-5 days)  - Avoid travel on public transportation if possible (such as bus, train, or airplane) or o Sent to the Emergency Department by EMS for evaluation, COVID-19 testing, and possible  admission depending on your condition and test results.  What to do if you are LOW RISK for COVID-19?  Reduce your risk of any infection by using the same precautions used for avoiding the common cold or flu:  . Wash your hands often with soap and warm water for at least 20 seconds.  If soap and water are not readily available, use an alcohol-based hand sanitizer with at least 60% alcohol.  . If coughing or sneezing, cover your mouth and nose by coughing or sneezing into the elbow areas of your shirt or coat, into a tissue or into your sleeve (not your hands). . Avoid shaking hands with others and consider head nods or verbal greetings only. . Avoid touching your eyes, nose, or mouth with unwashed hands.  . Avoid close contact with people who are sick. . Avoid places or events with large numbers of people in one location, like concerts or sporting events. . Carefully consider travel plans you have or are making. . If you are planning any travel outside or inside the [place], visit the CDC's Travelers' Health webpage for the latest health notices. . If you have some symptoms but not all symptoms, continue to monitor at home and seek medical attention if your symptoms worsen. . If you are having a medical emergency, call 911.   ADDITIONAL HEALTHCARE OPTIONS FOR PATIENTS  [facility] / e-Visit: [email or web address]         [facility]: [phone]  [facility]: [phone]                   [facility]: [phone]   

## 2019-02-17 ENCOUNTER — Telehealth: Payer: Self-pay | Admitting: Cardiology

## 2019-02-17 NOTE — Telephone Encounter (Signed)
Spoke with pt, aware not sure where the other letter came from but not [place].

## 2019-02-17 NOTE — Telephone Encounter (Signed)
New Message    Pt says she received the results of the blood work but there was a letter attached to it and at the bottom it wanted her to sign to release information    Please call

## 2019-02-20 ENCOUNTER — Other Ambulatory Visit: Payer: Self-pay

## 2019-02-20 ENCOUNTER — Ambulatory Visit: Payer: Medicare Other | Attending: Family Medicine | Admitting: Physical Therapy

## 2019-02-20 DIAGNOSIS — R2689 Other abnormalities of gait and mobility: Secondary | ICD-10-CM | POA: Diagnosis not present

## 2019-02-20 DIAGNOSIS — R2681 Unsteadiness on feet: Secondary | ICD-10-CM | POA: Diagnosis present

## 2019-02-20 DIAGNOSIS — M6281 Muscle weakness (generalized): Secondary | ICD-10-CM | POA: Diagnosis present

## 2019-02-21 ENCOUNTER — Encounter: Payer: Self-pay | Admitting: Physical Therapy

## 2019-02-21 NOTE — Therapy (Signed)
[facility] [address] [place], [place], [address] Phone: [phone]   Fax:  [phone]  Physical Therapy Evaluation  Patient Details  Name: [name] MRN: [id number] Date of Birth: May 05, 1932 Referring Provider (PT): Dr. Harlan Stains  CLINIC OPERATION CHANGES: [facility] is open at lower capacity following universal masking, social distancing, and patient screening.  The patient's COVID risk of  complications score is 8.  Encounter Date: 02/20/2019  PT End of Session - 02/21/19 1536    Visit Number  1    Number of Visits  9    Date for PT Re-Evaluation  04/18/19    Authorization Type  [employer] Medicare    Authorization Time Period  02-20-19 - 05-19-19    PT Start Time  1204    PT Stop Time  1249    PT Time Calculation (min)  45 min    Equipment Utilized During Treatment  Oxygen   2L   Activity Tolerance  Patient limited by fatigue    Behavior During Therapy  WFL for tasks assessed/performed       Past Medical History:  Diagnosis Date  . Anxiety   . Aortic stenosis   . Arthritis   . Asthma    uses inhaler as needed  . Depression   . Diabetes mellitus   . Emphysema of lung ([place])   . GERD (gastroesophageal reflux disease)   . Glaucoma   . Hyperlipidemia   . Hypertension   . [name]   . Murmur   . Nephrolithiasis   . Pneumonia 3/17 and 3/18    Past Surgical History:  Procedure Laterality Date  . ABDOMINAL HYSTERECTOMY    . ANKLE SURGERY  1998   d/t fx.  Left ankle  . ANTERIOR AND POSTERIOR REPAIR  06/21/2011   Procedure: ANTERIOR (CYSTOCELE) AND POSTERIOR REPAIR (RECTOCELE);  Surgeon: Maisie Fus;  Location: [facility];  Service: Gynecology;  Laterality: N/A;  . AORTIC VALVE REPLACEMENT N/A 07/17/2013   Procedure: AORTIC VALVE REPLACEMENT (AVR);  Surgeon: Ivin Poot, MD;  Location: [facility];  Service: Open Heart Surgery;  Laterality: N/A;  . CARDIAC  CATHETERIZATION    . CARDIAC VALVE REPLACEMENT    . CORONARY ANGIOPLASTY    . CORONARY ARTERY BYPASS GRAFT N/A 07/17/2013   Procedure: CORONARY ARTERY BYPASS GRAFT, TIMES ONE, ON PUMP, USING RIGHT INTERNAL MAMMARY ARTERY.;  Surgeon: Ivin Poot, MD;  Location: [facility];  Service: Open Heart Surgery;  Laterality: N/A;  . FACIAL COSMETIC SURGERY     face lift  . INTRAOPERATIVE TRANSESOPHAGEAL ECHOCARDIOGRAM N/A 07/17/2013   Procedure: INTRAOPERATIVE TRANSESOPHAGEAL ECHOCARDIOGRAM;  Surgeon: Ivin Poot, MD;  Location: [facility];  Service: Open Heart Surgery;  Laterality: N/A;  . LAPAROSCOPIC ASSISTED VAGINAL HYSTERECTOMY  06/21/2011   Procedure: LAPAROSCOPIC ASSISTED VAGINAL HYSTERECTOMY;  Surgeon: Maisie Fus;  Location: [facility];  Service: Gynecology;  Laterality: N/A;  . LEFT AND RIGHT HEART CATHETERIZATION WITH CORONARY ANGIOGRAM N/A 06/27/2013   Procedure: LEFT AND RIGHT HEART CATHETERIZATION WITH CORONARY ANGIOGRAM;  Surgeon: Ramond Dial, MD;  Location: [facility];  Service: Cardiovascular;  Laterality: N/A;  . NOSE SURGERY    . SALPINGOOPHORECTOMY  06/21/2011   Procedure: SALPINGO OOPHERECTOMY;  Surgeon: Maisie Fus;  Location: [facility];  Service: Gynecology;  Laterality: Bilateral;  . VAGINAL PROLAPSE REPAIR  06/21/2011   Procedure: SACROPEXY;  Surgeon: Maisie Fus;  Location: [facility];  Service:  Gynecology;  Laterality: N/A;  sacrospinous ligament suspension    There were no vitals filed for this visit.   Subjective Assessment - 02/21/19 1518    Subjective  Pt states she was hospitalized with pneumonia from 09-05-18 - 09-20-18; has been weak and tired since this illness; states she has no energy; pt cont to use rollator for assistance with amb.    Pertinent History  COPD; emphysema:  arthritis:  HTN:  s/p CABG x 1 07-17-13:  s/p Aortic valve replacement 07-17-13:  type 2 DM;  h/o pneumonia    Patient Stated Goals  "want to get some energy"    Currently in Pain?  No/denies          [facility] PT Assessment - 02/21/19 0001      Assessment   Medical Diagnosis  Gait and balance disorder; s/p falls    Referring Provider (PT)  Dr. Harlan Stains    Onset Date/Surgical Date  09/05/18    Prior Therapy  pt was seen at this facility from 09-04-17 - 10-23-17 and again from 01-25-18 - 03-20-18 for same diagnosis      Precautions   Precautions  Fall      Restrictions   Weight Bearing Restrictions  No      Balance Screen   Has the patient fallen in the past 6 months  Yes    How many times?  2   in past 2 months   Has the patient had a decrease in activity level because of a fear of falling?   Yes    Is the patient reluctant to leave their home because of a fear of falling?   No      Home Environment   Living Environment  Private residence    Living Arrangements  Spouse/significant other    Type of [place] to enter    Entrance Stairs-Number of Steps  4    Entrance Stairs-Rails  Cannot reach both    [place] of Steps  [place] - 4 wheels;Grab bars - tub/shower    Additional Comments  Pt is on 2L oxygen      Prior Function   Level of Independence  Independent with basic ADLs;Independent with household mobility without device;Independent with community mobility with device;Needs assistance with homemaking      ROM / Strength   AROM / PROM / Strength  Strength      Strength   Overall Strength  Within functional limits for tasks performed    Overall Strength Comments  bil. LE's      Transfers   Transfers  Sit to Stand    Sit to Stand  5: Supervision    Five time sit to stand comments   15.31 secs from standard chair      Ambulation/Gait   Ambulation/Gait  Yes    Ambulation/Gait Assistance  5: Supervision    Ambulation Distance (Feet)  230 Feet   2 laps (230') in 2'15 secs in attempt of 3" walk test   Assistive device  4-wheeled walker    Gait Pattern  Step-through  pattern    Gait velocity  18.79 =1.75 ft/sec with rollator    Gait Comments  pt on 2L 02: pt's 02 sat rate 90% after amb. 2\' 15"  around track      Balance   Balance Assessed  Yes  Static Standing Balance   Static Standing - Level of Assistance  5: Stand by assistance    Static Standing - Comment/# of Minutes  1"   prior to fatigue - no UE support     Standardized Balance Assessment   Standardized Balance Assessment  Timed Up and Go Test      Timed Up and Go Test   Normal TUG (seconds)  18.68   with RW:  20.10 secs without RW               Objective measurements completed on examination: See above findings.                   PT Long Term Goals - 02/21/19 1549      PT LONG TERM GOAL #1   Title  Increase endurance/activity tolerance so pt able to amb. 5" nonstop with use of rollator.    Baseline  2" 15 secs - 230' on 02-20-19    Time  4    Period  Weeks    Status  New    Target Date  04/18/19   extended target date due to limited availability of appts for starting PT program     PT LONG TERM GOAL #2   Title  Improve TUG score from 20.10 secs without rollator to </= 16 secs without use of rollator.    Time  4    Period  Weeks    Status  New    Target Date  04/18/19      PT LONG TERM GOAL #3   Title  Improve 5x sit to stand score from 15.31 secs to </= 12 secs to demo improved LE functional strength.    Baseline  15.31 secs - 02-20-19    Time  4    Period  Weeks    Status  New    Target Date  04/18/19      PT LONG TERM GOAL #4   Title  Increase gait velocity from 1.75 ft/sec with rollator to >/=2.1 ft/sec with rollator for incr. gait efficiency.    Baseline  18.79 secs = 1.75 ft/sec -  02-20-19    Time  4    Period  Weeks    Status  New    Target Date  04/18/19      PT LONG TERM GOAL #5   Title  Amb. 115' without rollator without LOB modified independently for safe household amb., as pt reports she does not use device in home.    Time  4     Period  Weeks    Status  New    Target Date  04/18/19      Additional Long Term Goals   Additional Long Term Goals  Yes      PT LONG TERM GOAL #6   Title  Independent in HEP for balance and strengthening exercises.    Time  4    Period  Weeks    Status  New    Target Date  04/18/19             Plan - 02/21/19 1539    Clinical Impression Statement  Pt is an 85 yr old lady known to this facility and therapist from previous admissions.  Pt presents with deconditioning due to hospitalization 12-19 -19 - 09-20-18 for pneumonia and limited activity due to risk of Covid 19 exposure.  Pt demonstrates decreased endurance as 3" walk test was attempted, but pt required  seated rest period after amb. 230' in 2" 15 secs with use of rollator.  Pt has decreased gait speed with gait velocity 1.75 ft/sec with use of rollator.  Pt is at risk for fall per TUG score of 18.68 secs with rollator & 20.10 secs without use of RW.  Pt able to stand statically for 1" without UE support without LOB.  Pt will benefit from PT to address endurance, balance and gait deficits.                                                                                                                       Personal Factors and Comorbidities  Comorbidity 2;Time since onset of injury/illness/exacerbation;Age    Comorbidities  COPD;  s/p AVR 07-17-13;  s/p CABG x1  07-17-13;  emphysema:  HTN:  h/o pneumonia:  Type 2 DM    Examination-Activity Limitations  Locomotion Level;Stand;Stairs;Squat;Carry    Examination-Participation Restrictions  Church;Meal Prep;Cleaning;Community Activity;Interpersonal Relationship;Driving;Shop    Stability/Clinical Decision Making  Evolving/Moderate complexity    Clinical Decision Making  Moderate    Rehab Potential  Good    PT Frequency  2x / week    PT Duration  4 weeks    PT Treatment/Interventions  ADLs/Self Care Home Management;Gait training;Stair training;Therapeutic activities;Therapeutic  exercise;Balance training;Neuromuscular re-education;Patient/family education    PT Next Visit Plan  review HEP as previously instructed; LE strengthening, balance and gait    PT Home Exercise Plan  balance and functional strengthening    Consulted and Agree with Plan of Care  Patient       Patient will benefit from skilled therapeutic intervention in order to improve the following deficits and impairments:  Difficulty walking, Cardiopulmonary status limiting activity, Decreased activity tolerance, Decreased balance, Decreased strength, Decreased endurance, Postural dysfunction  Visit Diagnosis: Other abnormalities of gait and mobility - Plan: PT plan of care cert/re-cert  Unsteadiness on feet - Plan: PT plan of care cert/re-cert  Muscle weakness (generalized) - Plan: PT plan of care cert/re-cert     Problem List Patient Active Problem List   Diagnosis Date Noted  . Chest tube in place   . Acute pneumothorax   . Pneumothorax on left   . Protein-calorie malnutrition, severe 09/06/2018  . Community acquired pneumonia of left lower lobe of lung ([place]) 09/05/2018  . Pulmonary nodule 12/03/2017  . Iron deficiency anemia   . Heme + stool   . CAD (coronary artery disease) 10/27/2017  . Microcytic anemia 10/27/2017  . Anemia due to GI blood loss 10/27/2017  . COPD with acute exacerbation ([place]) 10/27/2017  . Pulmonary nodule, left 10/15/2017  . Anemia, chronic disease 12/07/2016  . Chronic obstructive pulmonary disease ([place])   . Acute on chronic respiratory failure with hypoxia ([place]) 11/19/2016  . CKD (chronic kidney disease), stage III ([place]) 11/19/2016  . Diabetes mellitus type 2, uncontrolled ([place]) 11/19/2016  . Recurrent pneumonia 11/19/2016  . Chronic diastolic CHF (congestive heart failure) ([place]) 11/19/2016  . Pulmonary HTN ([place]) 11/19/2016  .  HLD (hyperlipidemia) 11/19/2016  . Hiatal hernia 05/01/2016  . History of bacterial pneumonia 05/01/2016  . COPD exacerbation ([place])  11/18/2015  . Physical deconditioning 07/24/2013  . S/P AVR (aortic valve replacement) 07/23/2013  . S/P CABG x 1 07/23/2013  . Aortic stenosis 06/10/2013  . Essential hypertension 06/10/2013  . COPD GOLD II  10/26/2011    Alda Lea, PT 02/21/2019, 4:00 PM  [facility] [address] [place], [place], [address] Phone: [phone]   Fax:  [phone]  Name: [name] MRN: [id number] Date of Birth: [birth year]

## 2019-02-25 ENCOUNTER — Other Ambulatory Visit: Payer: Self-pay | Admitting: Family Medicine

## 2019-02-25 DIAGNOSIS — E041 Nontoxic single thyroid nodule: Secondary | ICD-10-CM

## 2019-03-07 ENCOUNTER — Other Ambulatory Visit: Payer: Self-pay

## 2019-03-07 ENCOUNTER — Ambulatory Visit
Admission: RE | Admit: 2019-03-07 | Discharge: 2019-03-07 | Disposition: A | Discharge status: Home / Self Care | Payer: Medicare Other | Source: Ambulatory Visit | Attending: Family Medicine | Admitting: Family Medicine

## 2019-03-07 DIAGNOSIS — E041 Nontoxic single thyroid nodule: Secondary | ICD-10-CM

## 2019-03-14 ENCOUNTER — Ambulatory Visit: Payer: Medicare Other | Admitting: Podiatry

## 2019-03-14 ENCOUNTER — Other Ambulatory Visit: Payer: Self-pay | Admitting: Family Medicine

## 2019-03-14 ENCOUNTER — Other Ambulatory Visit: Payer: Self-pay

## 2019-03-14 ENCOUNTER — Encounter: Payer: Self-pay | Admitting: Podiatry

## 2019-03-14 DIAGNOSIS — M79675 Pain in left toe(s): Secondary | ICD-10-CM

## 2019-03-14 DIAGNOSIS — E041 Nontoxic single thyroid nodule: Secondary | ICD-10-CM

## 2019-03-14 DIAGNOSIS — E119 Type 2 diabetes mellitus without complications: Secondary | ICD-10-CM

## 2019-03-14 DIAGNOSIS — M79674 Pain in right toe(s): Secondary | ICD-10-CM

## 2019-03-14 DIAGNOSIS — B351 Tinea unguium: Secondary | ICD-10-CM | POA: Insufficient documentation

## 2019-03-14 NOTE — Progress Notes (Signed)
Complaint:  Visit Type: Patient returns to my office for continued preventative foot care services. Complaint: Patient states" my nails have grown long and thick and become painful to walk and wear shoes" Patient has been  Diagnosed with DM. The patient presents for preventative foot care services. No changes to ROS.  Patient has just been released from the hospital.  Podiatric Exam: Vascular: dorsalis pedis and posterior tibial pulses are palpable bilateral. Capillary return is immediate. Temperature gradient is WNL. Skin turgor WNL  Sensorium: Normal Semmes Weinstein monofilament test. Normal tactile sensation bilaterally. Nail Exam: Pt has thick disfigured discolored nails with subungual debris noted bilateral entire nail hallux through fifth toenails Ulcer Exam: There is no evidence of ulcer or pre-ulcerative changes or infection. Orthopedic Exam: Muscle tone and strength are WNL. No limitations in general ROM. No crepitus or effusions noted. Foot type and digits show no abnormalities. Bony prominences are unremarkable. Skin: No Porokeratosis. No infection or ulcers  Diagnosis:  Onychomycosis, , Pain in right toe, pain in left toes  Treatment & Plan Procedures and Treatment: Consent by patient was obtained for treatment procedures.   Debridement of mycotic and hypertrophic toenails, 1 through 5 bilateral and clearing of subungual debris. No ulceration, no infection noted.  Return Visit-Office Procedure: Patient instructed to return to the office for a follow up visit 4 months for continued evaluation and treatment.    Gardiner Barefoot DPM

## 2019-03-18 ENCOUNTER — Ambulatory Visit
Admission: RE | Admit: 2019-03-18 | Discharge: 2019-03-18 | Disposition: A | Discharge status: Home / Self Care | Payer: Medicare Other | Source: Ambulatory Visit | Attending: Family Medicine | Admitting: Family Medicine

## 2019-03-18 ENCOUNTER — Other Ambulatory Visit (HOSPITAL_COMMUNITY)
Admission: RE | Admit: 2019-03-18 | Discharge: 2019-03-18 | Disposition: A | Discharge status: Home / Self Care | Payer: Medicare Other | Source: Ambulatory Visit | Attending: Student | Admitting: Student

## 2019-03-18 DIAGNOSIS — E041 Nontoxic single thyroid nodule: Secondary | ICD-10-CM

## 2019-03-18 DIAGNOSIS — D34 Benign neoplasm of thyroid gland: Secondary | ICD-10-CM | POA: Insufficient documentation

## 2019-03-18 DIAGNOSIS — E042 Nontoxic multinodular goiter: Secondary | ICD-10-CM | POA: Diagnosis present

## 2019-03-20 ENCOUNTER — Telehealth: Payer: Self-pay | Admitting: Internal Medicine

## 2019-03-20 MED ORDER — PREDNISONE 10 MG PO TABS: ORAL_TABLET | ORAL | 0 refills | Status: DC

## 2019-03-20 NOTE — Telephone Encounter (Signed)
Primary Pulmonologist: MR Last office visit and with whom: 11/18/18 MR What do we see them for (pulmonary problems): COPD Last OV assessment/plan:  1. Chronic respiratory failure with hypoxia (HCC) J96.11   2. COPD GOLD II  J44.9     Copd Is stable  Plan Continue o2 and inahlers as before (stiolto and flovent) Albuterol as needed Avoid sick contacts  If feeling sick, then call us and we can see about treating over phone v coming in  Followup 6 months or sooner     Was appointment offered to patient (explain)?  Patient wanted recommendations first    Reason for call: Spoke with patient. She stated that she has become more SOB within the past 2-3 days. She is still using her O2 at 3L. When she is in her living room, her O2 is at 97%. When she attempts to go back to her room, her O2 drops down to 77%. It will recover but it takes a few minutes. She has a cough but it has gotten better in recent days. Denies any fever, body aches or chills. She took her temp this morning and it was 97.29F. She also stated that her mouth is becoming increasingly dry. She denied starting any medications that may cause dry mouth. From listening to her on the phone, it sounded like she was breathing strictly from her mouth.   Denied being around anyone with COVID or being tested for COVID.   Pharmacy is [facility] on [address].   [place], please advise since MR is not in the office until this afternoon. Thanks!

## 2019-03-20 NOTE — Telephone Encounter (Signed)
I will order prednisone. Patient can increase O2 to 3 L if needed with exertion to keep sats above 90%. Thanks.

## 2019-03-20 NOTE — Telephone Encounter (Signed)
Spoke with patient. She is aware of recommendations. Verbalized understanding. Nothing further needed at time of call.

## 2019-03-21 ENCOUNTER — Emergency Department (HOSPITAL_COMMUNITY): Payer: Medicare Other

## 2019-03-21 ENCOUNTER — Inpatient Hospital Stay (HOSPITAL_COMMUNITY)
Admission: EM | Admit: 2019-03-21 | Discharge: 2019-03-24 | DRG: 871 | Disposition: A | Discharge status: Home Health | Payer: Medicare Other | Attending: Internal Medicine | Admitting: Internal Medicine

## 2019-03-21 ENCOUNTER — Telehealth: Payer: Self-pay

## 2019-03-21 ENCOUNTER — Encounter (HOSPITAL_COMMUNITY): Payer: Self-pay | Admitting: Internal Medicine

## 2019-03-21 DIAGNOSIS — J440 Chronic obstructive pulmonary disease with acute lower respiratory infection: Secondary | ICD-10-CM | POA: Diagnosis present

## 2019-03-21 DIAGNOSIS — A419 Sepsis, unspecified organism: Secondary | ICD-10-CM | POA: Diagnosis present

## 2019-03-21 DIAGNOSIS — D72829 Elevated white blood cell count, unspecified: Secondary | ICD-10-CM | POA: Diagnosis present

## 2019-03-21 DIAGNOSIS — E785 Hyperlipidemia, unspecified: Secondary | ICD-10-CM | POA: Diagnosis present

## 2019-03-21 DIAGNOSIS — J441 Chronic obstructive pulmonary disease with (acute) exacerbation: Secondary | ICD-10-CM | POA: Diagnosis present

## 2019-03-21 DIAGNOSIS — I130 Hypertensive heart and chronic kidney disease with heart failure and stage 1 through stage 4 chronic kidney disease, or unspecified chronic kidney disease: Secondary | ICD-10-CM | POA: Diagnosis present

## 2019-03-21 DIAGNOSIS — E042 Nontoxic multinodular goiter: Secondary | ICD-10-CM | POA: Diagnosis present

## 2019-03-21 DIAGNOSIS — Z825 Family history of asthma and other chronic lower respiratory diseases: Secondary | ICD-10-CM

## 2019-03-21 DIAGNOSIS — Z951 Presence of aortocoronary bypass graft: Secondary | ICD-10-CM

## 2019-03-21 DIAGNOSIS — Z87442 Personal history of urinary calculi: Secondary | ICD-10-CM

## 2019-03-21 DIAGNOSIS — K589 Irritable bowel syndrome without diarrhea: Secondary | ICD-10-CM | POA: Diagnosis present

## 2019-03-21 DIAGNOSIS — Z20828 Contact with and (suspected) exposure to other viral communicable diseases: Secondary | ICD-10-CM | POA: Diagnosis present

## 2019-03-21 DIAGNOSIS — I160 Hypertensive urgency: Secondary | ICD-10-CM | POA: Diagnosis present

## 2019-03-21 DIAGNOSIS — Z923 Personal history of irradiation: Secondary | ICD-10-CM

## 2019-03-21 DIAGNOSIS — E1122 Type 2 diabetes mellitus with diabetic chronic kidney disease: Secondary | ICD-10-CM | POA: Diagnosis present

## 2019-03-21 DIAGNOSIS — I5032 Chronic diastolic (congestive) heart failure: Secondary | ICD-10-CM | POA: Diagnosis present

## 2019-03-21 DIAGNOSIS — F419 Anxiety disorder, unspecified: Secondary | ICD-10-CM | POA: Diagnosis present

## 2019-03-21 DIAGNOSIS — E1165 Type 2 diabetes mellitus with hyperglycemia: Secondary | ICD-10-CM | POA: Diagnosis present

## 2019-03-21 DIAGNOSIS — Z515 Encounter for palliative care: Secondary | ICD-10-CM | POA: Diagnosis present

## 2019-03-21 DIAGNOSIS — J449 Chronic obstructive pulmonary disease, unspecified: Secondary | ICD-10-CM | POA: Diagnosis present

## 2019-03-21 DIAGNOSIS — Z90710 Acquired absence of both cervix and uterus: Secondary | ICD-10-CM

## 2019-03-21 DIAGNOSIS — Z8701 Personal history of pneumonia (recurrent): Secondary | ICD-10-CM

## 2019-03-21 DIAGNOSIS — Y842 Radiological procedure and radiotherapy as the cause of abnormal reaction of the patient, or of later complication, without mention of misadventure at the time of the procedure: Secondary | ICD-10-CM | POA: Diagnosis present

## 2019-03-21 DIAGNOSIS — F329 Major depressive disorder, single episode, unspecified: Secondary | ICD-10-CM | POA: Diagnosis present

## 2019-03-21 DIAGNOSIS — IMO0002 Reserved for concepts with insufficient information to code with codable children: Secondary | ICD-10-CM | POA: Diagnosis present

## 2019-03-21 DIAGNOSIS — I350 Nonrheumatic aortic (valve) stenosis: Secondary | ICD-10-CM | POA: Diagnosis present

## 2019-03-21 DIAGNOSIS — J9621 Acute and chronic respiratory failure with hypoxia: Secondary | ICD-10-CM | POA: Diagnosis present

## 2019-03-21 DIAGNOSIS — J189 Pneumonia, unspecified organism: Secondary | ICD-10-CM | POA: Diagnosis present

## 2019-03-21 DIAGNOSIS — N183 Chronic kidney disease, stage 3 unspecified: Secondary | ICD-10-CM | POA: Diagnosis present

## 2019-03-21 DIAGNOSIS — C3490 Malignant neoplasm of unspecified part of unspecified bronchus or lung: Secondary | ICD-10-CM | POA: Diagnosis present

## 2019-03-21 DIAGNOSIS — Z7189 Other specified counseling: Secondary | ICD-10-CM | POA: Diagnosis not present

## 2019-03-21 DIAGNOSIS — Z85118 Personal history of other malignant neoplasm of bronchus and lung: Secondary | ICD-10-CM | POA: Diagnosis not present

## 2019-03-21 DIAGNOSIS — K219 Gastro-esophageal reflux disease without esophagitis: Secondary | ICD-10-CM | POA: Diagnosis present

## 2019-03-21 DIAGNOSIS — H409 Unspecified glaucoma: Secondary | ICD-10-CM | POA: Diagnosis present

## 2019-03-21 DIAGNOSIS — Z87891 Personal history of nicotine dependence: Secondary | ICD-10-CM

## 2019-03-21 DIAGNOSIS — Z952 Presence of prosthetic heart valve: Secondary | ICD-10-CM

## 2019-03-21 DIAGNOSIS — I272 Pulmonary hypertension, unspecified: Secondary | ICD-10-CM | POA: Diagnosis present

## 2019-03-21 DIAGNOSIS — Z79899 Other long term (current) drug therapy: Secondary | ICD-10-CM

## 2019-03-21 LAB — MAGNESIUM: Magnesium: 2.1 mg/dL (ref 1.7–2.4)

## 2019-03-21 LAB — CBC WITH DIFFERENTIAL/PLATELET
Abs Immature Granulocytes: 0.53 10*3/uL — ABNORMAL HIGH (ref 0.00–0.07)
Basophils Absolute: 0.1 10*3/uL (ref 0.0–0.1)
Basophils Relative: 0 %
Eosinophils Absolute: 0 10*3/uL (ref 0.0–0.5)
Eosinophils Relative: 0 %
HCT: 38.5 % (ref 36.0–46.0)
Hemoglobin: 11.8 g/dL — ABNORMAL LOW (ref 12.0–15.0)
Immature Granulocytes: 2 %
Lymphocytes Relative: 4 %
Lymphs Abs: 1.5 10*3/uL (ref 0.7–4.0)
MCH: 29.2 pg (ref 26.0–34.0)
MCHC: 30.6 g/dL (ref 30.0–36.0)
MCV: 95.3 fL (ref 80.0–100.0)
Monocytes Absolute: 1.3 10*3/uL — ABNORMAL HIGH (ref 0.1–1.0)
Monocytes Relative: 4 %
Neutro Abs: 30 10*3/uL — ABNORMAL HIGH (ref 1.7–7.7)
Neutrophils Relative %: 90 %
Platelets: 375 10*3/uL (ref 150–400)
RBC: 4.04 MIL/uL (ref 3.87–5.11)
RDW: 13.2 % (ref 11.5–15.5)
WBC: 33.3 10*3/uL — ABNORMAL HIGH (ref 4.0–10.5)
nRBC: 0 % (ref 0.0–0.2)

## 2019-03-21 LAB — COMPREHENSIVE METABOLIC PANEL
ALT: 48 U/L — ABNORMAL HIGH (ref 0–44)
AST: 40 U/L (ref 15–41)
Albumin: 3.2 g/dL — ABNORMAL LOW (ref 3.5–5.0)
Alkaline Phosphatase: 88 U/L (ref 38–126)
Anion gap: 13 (ref 5–15)
BUN: 26 mg/dL — ABNORMAL HIGH (ref 8–23)
CO2: 21 mmol/L — ABNORMAL LOW (ref 22–32)
Calcium: 9.9 mg/dL (ref 8.9–10.3)
Chloride: 105 mmol/L (ref 98–111)
Creatinine, Ser: 1.09 mg/dL — ABNORMAL HIGH (ref 0.44–1.00)
GFR calc Af Amer: 54 mL/min — ABNORMAL LOW (ref >60)
GFR calc non Af Amer: 47 mL/min — ABNORMAL LOW (ref >60)
Glucose, Bld: 338 mg/dL — ABNORMAL HIGH (ref 70–99)
Potassium: 4.2 mmol/L (ref 3.5–5.1)
Sodium: 139 mmol/L (ref 135–145)
Total Bilirubin: 0.7 mg/dL (ref 0.3–1.2)
Total Protein: 7.4 g/dL (ref 6.5–8.1)

## 2019-03-21 LAB — LACTIC ACID, PLASMA
Lactic Acid, Venous: 2 mmol/L (ref 0.5–1.9)
Lactic Acid, Venous: 1.4 mmol/L (ref 0.5–1.9)
Lactic Acid, Venous: 1.4 mmol/L (ref 0.5–1.9)
Lactic Acid, Venous: 1.3 mmol/L (ref 0.5–1.9)

## 2019-03-21 LAB — TSH: TSH: 1.768 u[IU]/mL (ref 0.350–4.500)

## 2019-03-21 LAB — GLUCOSE, CAPILLARY: Glucose-Capillary: 189 mg/dL — ABNORMAL HIGH (ref 70–99)

## 2019-03-21 LAB — D-DIMER, QUANTITATIVE: D-Dimer, Quant: 3.04 ug/mL-FEU — ABNORMAL HIGH (ref 0.00–0.50)

## 2019-03-21 LAB — SARS CORONAVIRUS 2 BY RT PCR (HOSPITAL ORDER, PERFORMED IN ~~LOC~~ HOSPITAL LAB): SARS Coronavirus 2: NEGATIVE

## 2019-03-21 LAB — BRAIN NATRIURETIC PEPTIDE: B Natriuretic Peptide: 560.2 pg/mL — ABNORMAL HIGH (ref 0.0–100.0)

## 2019-03-21 LAB — HEMOGLOBIN A1C
Hgb A1c MFr Bld: 6.7 % — ABNORMAL HIGH (ref 4.8–5.6)
Mean Plasma Glucose: 145.59 mg/dL

## 2019-03-21 MED ORDER — SODIUM CHLORIDE 0.9% FLUSH
3.0000 mL | Freq: Two times a day (BID) | INTRAVENOUS | Status: DC
  Administered 2019-03-21 – 2019-03-24 (×5): 3 mL via INTRAVENOUS

## 2019-03-21 MED ORDER — ACETAMINOPHEN 325 MG PO TABS
650.0000 mg | ORAL_TABLET | Freq: Once | ORAL | Status: AC
  Administered 2019-03-21: 16:00:00 650 mg via ORAL
  Filled 2019-03-21: qty 2

## 2019-03-21 MED ORDER — MAGNESIUM OXIDE 400 (241.3 MG) MG PO TABS
200.0000 mg | ORAL_TABLET | Freq: Every day | ORAL | Status: DC
  Administered 2019-03-22 – 2019-03-23 (×2): 200 mg via ORAL
  Filled 2019-03-21: qty 1

## 2019-03-21 MED ORDER — ONDANSETRON HCL 4 MG PO TABS: 4.0000 mg | ORAL_TABLET | Freq: Four times a day (QID) | ORAL | Status: DC | PRN

## 2019-03-21 MED ORDER — SODIUM CHLORIDE 0.9% FLUSH: 10.0000 mL | INTRAVENOUS | Status: DC | PRN

## 2019-03-21 MED ORDER — HYDROCHLOROTHIAZIDE 25 MG PO TABS
12.5000 mg | ORAL_TABLET | Freq: Every morning | ORAL | Status: DC
  Administered 2019-03-22 – 2019-03-24 (×3): 12.5 mg via ORAL
  Filled 2019-03-21 (×4): qty 1

## 2019-03-21 MED ORDER — PRAVASTATIN SODIUM 40 MG PO TABS
40.0000 mg | ORAL_TABLET | Freq: Every evening | ORAL | Status: DC
  Administered 2019-03-22 – 2019-03-23 (×2): 40 mg via ORAL
  Filled 2019-03-21 (×3): qty 1

## 2019-03-21 MED ORDER — SODIUM CHLORIDE 0.9 % IV SOLN: 2.0000 g | INTRAVENOUS | Status: DC

## 2019-03-21 MED ORDER — OCUVITE-LUTEIN PO CAPS
1.0000 | ORAL_CAPSULE | Freq: Every day | ORAL | Status: DC
  Filled 2019-03-21 (×2): qty 1

## 2019-03-21 MED ORDER — HYDRALAZINE HCL 20 MG/ML IJ SOLN
20.0000 mg | Freq: Four times a day (QID) | INTRAMUSCULAR | Status: DC | PRN
  Administered 2019-03-21 – 2019-03-22 (×2): 20 mg via INTRAVENOUS
  Filled 2019-03-21 (×2): qty 1

## 2019-03-21 MED ORDER — ACETAMINOPHEN 325 MG PO TABS
650.0000 mg | ORAL_TABLET | Freq: Four times a day (QID) | ORAL | Status: DC | PRN
  Administered 2019-03-23: 650 mg via ORAL
  Filled 2019-03-21: qty 2

## 2019-03-21 MED ORDER — SODIUM CHLORIDE 0.9% FLUSH: 10.0000 mL | Freq: Two times a day (BID) | INTRAVENOUS | Status: DC

## 2019-03-21 MED ORDER — ACETAMINOPHEN 650 MG RE SUPP: 650.0000 mg | Freq: Four times a day (QID) | RECTAL | Status: DC | PRN

## 2019-03-21 MED ORDER — IRON 325 (65 FE) MG PO TABS: 325.0000 mg | ORAL_TABLET | Freq: Two times a day (BID) | ORAL | Status: DC

## 2019-03-21 MED ORDER — MAGNESIUM 250 MG PO TABS: 250.0000 mg | ORAL_TABLET | Freq: Every day | ORAL | Status: DC

## 2019-03-21 MED ORDER — ALBUTEROL SULFATE HFA 108 (90 BASE) MCG/ACT IN AERS: 2.0000 | INHALATION_SPRAY | Freq: Four times a day (QID) | RESPIRATORY_TRACT | Status: DC | PRN

## 2019-03-21 MED ORDER — IRBESARTAN 300 MG PO TABS
300.0000 mg | ORAL_TABLET | Freq: Every day | ORAL | Status: DC
  Administered 2019-03-22 – 2019-03-24 (×3): 300 mg via ORAL
  Filled 2019-03-21 (×4): qty 1

## 2019-03-21 MED ORDER — POLYETHYLENE GLYCOL 3350 17 G PO PACK: 17.0000 g | PACK | Freq: Every day | ORAL | Status: DC | PRN

## 2019-03-21 MED ORDER — VITAMIN D3 25 MCG (1000 UNIT) PO TABS
1000.0000 [IU] | ORAL_TABLET | Freq: Every day | ORAL | Status: DC
  Administered 2019-03-22 – 2019-03-23 (×2): 1000 [IU] via ORAL
  Filled 2019-03-21 (×5): qty 1

## 2019-03-21 MED ORDER — FLUTICASONE PROPIONATE HFA 220 MCG/ACT IN AERO: 2.0000 | INHALATION_SPRAY | Freq: Two times a day (BID) | RESPIRATORY_TRACT | Status: DC

## 2019-03-21 MED ORDER — ALBUTEROL SULFATE (2.5 MG/3ML) 0.083% IN NEBU
2.5000 mg | INHALATION_SOLUTION | Freq: Four times a day (QID) | RESPIRATORY_TRACT | Status: DC | PRN
  Administered 2019-03-22: 18:00:00 2.5 mg via RESPIRATORY_TRACT
  Filled 2019-03-21: qty 3

## 2019-03-21 MED ORDER — MORPHINE SULFATE (PF) 2 MG/ML IV SOLN
2.0000 mg | INTRAVENOUS | Status: DC | PRN
  Administered 2019-03-21 – 2019-03-23 (×6): 2 mg via INTRAVENOUS
  Filled 2019-03-21 (×6): qty 1

## 2019-03-21 MED ORDER — ASPIRIN EC 81 MG PO TBEC
81.0000 mg | DELAYED_RELEASE_TABLET | Freq: Every day | ORAL | Status: DC
  Administered 2019-03-22 – 2019-03-23 (×2): 81 mg via ORAL
  Filled 2019-03-21: qty 1

## 2019-03-21 MED ORDER — SODIUM CHLORIDE 0.9 % IV SOLN: INTRAVENOUS | Status: DC

## 2019-03-21 MED ORDER — SODIUM CHLORIDE 0.9 % IV SOLN
1.0000 g | INTRAVENOUS | Status: DC
  Administered 2019-03-21 – 2019-03-24 (×4): 1 g via INTRAVENOUS
  Filled 2019-03-21 (×4): qty 10

## 2019-03-21 MED ORDER — SODIUM CHLORIDE 0.9 % IV SOLN: 500.0000 mg | INTRAVENOUS | Status: DC

## 2019-03-21 MED ORDER — POTASSIUM CHLORIDE IN NACL 20-0.9 MEQ/L-% IV SOLN
INTRAVENOUS | Status: AC
  Administered 2019-03-21: 19:00:00 via INTRAVENOUS
  Filled 2019-03-21 (×2): qty 1000

## 2019-03-21 MED ORDER — DOCUSATE SODIUM 100 MG PO CAPS
100.0000 mg | ORAL_CAPSULE | Freq: Two times a day (BID) | ORAL | Status: DC
  Administered 2019-03-22 – 2019-03-24 (×4): 100 mg via ORAL
  Filled 2019-03-21 (×4): qty 1

## 2019-03-21 MED ORDER — BUDESONIDE 0.5 MG/2ML IN SUSP
1.0000 mg | Freq: Two times a day (BID) | RESPIRATORY_TRACT | Status: DC
  Administered 2019-03-21 – 2019-03-24 (×6): 1 mg via RESPIRATORY_TRACT
  Filled 2019-03-21 (×6): qty 4

## 2019-03-21 MED ORDER — SODIUM CHLORIDE 0.9 % IV SOLN
500.0000 mg | INTRAVENOUS | Status: DC
  Administered 2019-03-21 – 2019-03-24 (×4): 500 mg via INTRAVENOUS
  Filled 2019-03-21 (×5): qty 500

## 2019-03-21 MED ORDER — WHITE PETROLATUM EX OINT
TOPICAL_OINTMENT | CUTANEOUS | Status: AC
  Administered 2019-03-21: 0.2
  Filled 2019-03-21: qty 28.35

## 2019-03-21 MED ORDER — INSULIN ASPART 100 UNIT/ML ~~LOC~~ SOLN
0.0000 [IU] | Freq: Every day | SUBCUTANEOUS | Status: DC
  Administered 2019-03-23: 3 [IU] via SUBCUTANEOUS

## 2019-03-21 MED ORDER — SERTRALINE HCL 100 MG PO TABS
100.0000 mg | ORAL_TABLET | Freq: Every morning | ORAL | Status: DC
  Administered 2019-03-22 – 2019-03-24 (×3): 100 mg via ORAL
  Filled 2019-03-21 (×4): qty 1

## 2019-03-21 MED ORDER — OXYCODONE HCL 5 MG PO TABS
5.0000 mg | ORAL_TABLET | ORAL | Status: DC | PRN
  Administered 2019-03-22 – 2019-03-24 (×5): 5 mg via ORAL
  Filled 2019-03-21 (×6): qty 1

## 2019-03-21 MED ORDER — BUPROPION HCL ER (XL) 150 MG PO TB24
150.0000 mg | ORAL_TABLET | Freq: Every day | ORAL | Status: DC
  Administered 2019-03-22 – 2019-03-24 (×3): 150 mg via ORAL
  Filled 2019-03-21 (×4): qty 1

## 2019-03-21 MED ORDER — FERROUS SULFATE 325 (65 FE) MG PO TABS
325.0000 mg | ORAL_TABLET | Freq: Two times a day (BID) | ORAL | Status: DC
  Administered 2019-03-22 – 2019-03-24 (×4): 325 mg via ORAL
  Filled 2019-03-21 (×5): qty 1

## 2019-03-21 MED ORDER — IOHEXOL 350 MG/ML SOLN
100.0000 mL | Freq: Once | INTRAVENOUS | Status: AC | PRN
  Administered 2019-03-21: 15:00:00 100 mL via INTRAVENOUS

## 2019-03-21 MED ORDER — OCUVITE EYE HEALTH FORMULA PO CAPS: ORAL_CAPSULE | Freq: Every day | ORAL | Status: DC

## 2019-03-21 MED ORDER — ALBUTEROL SULFATE HFA 108 (90 BASE) MCG/ACT IN AERS
4.0000 | INHALATION_SPRAY | Freq: Once | RESPIRATORY_TRACT | Status: AC
  Administered 2019-03-21: 14:00:00 4 via RESPIRATORY_TRACT
  Filled 2019-03-21: qty 6.7

## 2019-03-21 MED ORDER — ONDANSETRON HCL 4 MG/2ML IJ SOLN: 4.0000 mg | Freq: Four times a day (QID) | INTRAMUSCULAR | Status: DC | PRN

## 2019-03-21 MED ORDER — UMECLIDINIUM-VILANTEROL 62.5-25 MCG/INH IN AEPB
1.0000 | INHALATION_SPRAY | Freq: Every day | RESPIRATORY_TRACT | Status: DC
  Administered 2019-03-23 – 2019-03-24 (×2): 1 via RESPIRATORY_TRACT
  Filled 2019-03-21: qty 14

## 2019-03-21 MED ORDER — AMLODIPINE BESYLATE 5 MG PO TABS
10.0000 mg | ORAL_TABLET | Freq: Every day | ORAL | Status: DC
  Administered 2019-03-22 – 2019-03-24 (×3): 10 mg via ORAL
  Filled 2019-03-21 (×4): qty 2

## 2019-03-21 MED ORDER — GLUCERNA SHAKE PO LIQD
237.0000 mL | Freq: Three times a day (TID) | ORAL | Status: DC
  Administered 2019-03-22 – 2019-03-24 (×7): 237 mL via ORAL

## 2019-03-21 MED ORDER — INSULIN ASPART 100 UNIT/ML ~~LOC~~ SOLN
0.0000 [IU] | Freq: Three times a day (TID) | SUBCUTANEOUS | Status: DC
  Administered 2019-03-22: 7 [IU] via SUBCUTANEOUS
  Administered 2019-03-22: 10:00:00 1 [IU] via SUBCUTANEOUS
  Administered 2019-03-23: 19:00:00 5 [IU] via SUBCUTANEOUS
  Administered 2019-03-23 (×2): 2 [IU] via SUBCUTANEOUS
  Administered 2019-03-24: 13:00:00 5 [IU] via SUBCUTANEOUS
  Administered 2019-03-24: 1 [IU] via SUBCUTANEOUS

## 2019-03-21 MED ORDER — METHYLPREDNISOLONE SODIUM SUCC 125 MG IJ SOLR
125.0000 mg | Freq: Once | INTRAMUSCULAR | Status: DC
  Filled 2019-03-21: qty 2

## 2019-03-21 MED ORDER — ENOXAPARIN SODIUM 40 MG/0.4ML ~~LOC~~ SOLN
40.0000 mg | SUBCUTANEOUS | Status: DC
  Administered 2019-03-22 – 2019-03-23 (×2): 40 mg via SUBCUTANEOUS
  Filled 2019-03-21 (×2): qty 0.4

## 2019-03-21 NOTE — ED Notes (Addendum)
resp easier- still has wet cough-- on NRB -- sats at 100% Purewick placed.

## 2019-03-21 NOTE — Progress Notes (Signed)
Called to ED to assess patient. Was on NRB, SAT 92% at time of arrival. Labored WOB and audible rhonchi. Spoke with MD about NTS. She was NTS x2 for a copious amount of thick yellow secretions. Seems to be doing better now. Spoke with MD about BiPAP order, and confirmed that that would wait until after covid result. I don't feel as if she needs it at this moment. Patient stated she is feeling better.

## 2019-03-21 NOTE — ED Notes (Signed)
Admitting doctor at the bedside 

## 2019-03-21 NOTE — Telephone Encounter (Signed)
Dr. Harlan Stains calling to order COVID 19 test for pt. Opened chart and pt. Is currently in [facility] ED. Dr. Dema Severin given this information.

## 2019-03-21 NOTE — ED Provider Notes (Signed)
I received this patient in signout from Dr. Zenia Resides.  She had presented with shortness of breath and hypoxia, WBC 33,000 and elevated lactate.  Had obtained CTA of chest to rule out PE which was negative for PE but did show pneumonia.  At time of signout, she has been given ceftriaxone and azithromycin, blood cultures obtained and she is also received albuterol and Solu-Medrol given her underlying COPD history.  Awaiting phone call for admission.  3:55 PM Discussed w/ [facility], Dr. Evangeline Gula and pt admitted for further treatment.   Kaleem Sartwell, Wenda Overland, MD 03/21/19 [phone]

## 2019-03-21 NOTE — ED Provider Notes (Signed)
[facility] EMERGENCY DEPARTMENT Provider Note   CSN: [id number] Arrival date & time: 03/21/19  1022     History   Chief Complaint Chief Complaint  Patient presents with  . Shortness of Breath    HPI [name] is a 85 y.o. female.     85 year old female with history of COPD as well as CHF presents with increasing shortness of breath times several days.  Denies cough productive of white sputum and denies any fever or chills.  No anginal type chest pain.  Does note dyspnea on exertion as well as orthopnea.  Has been using prednisone without relief.  States that her current symptoms feel like her prior COPD exacerbations.     Past Medical History:  Diagnosis Date  . Anxiety   . Aortic stenosis   . Arthritis   . Asthma    uses inhaler as needed  . Depression   . Diabetes mellitus   . Emphysema of lung ([place])   . GERD (gastroesophageal reflux disease)   . Glaucoma   . Hyperlipidemia   . Hypertension   . [name]   . Murmur   . Nephrolithiasis   . Pneumonia 3/17 and 3/18    Patient Active Problem List   Diagnosis Date Noted  . Pain due to onychomycosis of toenails of both feet 03/14/2019  . Diabetes mellitus without complication ([place]) 40/98/1191  . Chest tube in place   . Acute pneumothorax   . Pneumothorax on left   . Protein-calorie malnutrition, severe 09/06/2018  . Community acquired pneumonia of left lower lobe of lung ([place]) 09/05/2018  . Pulmonary nodule 12/03/2017  . Iron deficiency anemia   . Heme + stool   . CAD (coronary artery disease) 10/27/2017  . Microcytic anemia 10/27/2017  . Anemia due to GI blood loss 10/27/2017  . COPD with acute exacerbation ([place]) 10/27/2017  . Pulmonary nodule, left 10/15/2017  . Anemia, chronic disease 12/07/2016  . Chronic obstructive pulmonary disease ([place])   . Acute on chronic respiratory failure with hypoxia ([place]) 11/19/2016  . CKD (chronic kidney disease), stage III ([place])  11/19/2016  . Diabetes mellitus type 2, uncontrolled ([place]) 11/19/2016  . Recurrent pneumonia 11/19/2016  . Chronic diastolic CHF (congestive heart failure) ([place]) 11/19/2016  . Pulmonary HTN ([place]) 11/19/2016  . HLD (hyperlipidemia) 11/19/2016  . Hiatal hernia 05/01/2016  . History of bacterial pneumonia 05/01/2016  . COPD exacerbation ([place]) 11/18/2015  . Physical deconditioning 07/24/2013  . S/P AVR (aortic valve replacement) 07/23/2013  . S/P CABG x 1 07/23/2013  . Aortic stenosis 06/10/2013  . Essential hypertension 06/10/2013  . COPD GOLD II  10/26/2011    Past Surgical History:  Procedure Laterality Date  . ABDOMINAL HYSTERECTOMY    . ANKLE SURGERY  1998   d/t fx.  Left ankle  . ANTERIOR AND POSTERIOR REPAIR  06/21/2011   Procedure: ANTERIOR (CYSTOCELE) AND POSTERIOR REPAIR (RECTOCELE);  Surgeon: Maisie Fus;  Location: [facility];  Service: Gynecology;  Laterality: N/A;  . AORTIC VALVE REPLACEMENT N/A 07/17/2013   Procedure: AORTIC VALVE REPLACEMENT (AVR);  Surgeon: Ivin Poot, MD;  Location: [facility];  Service: Open Heart Surgery;  Laterality: N/A;  . CARDIAC CATHETERIZATION    . CARDIAC VALVE REPLACEMENT    . CORONARY ANGIOPLASTY    . CORONARY ARTERY BYPASS GRAFT N/A 07/17/2013   Procedure: CORONARY ARTERY BYPASS GRAFT, TIMES ONE, ON PUMP, USING RIGHT INTERNAL MAMMARY ARTERY.;  Surgeon: Ivin Poot, MD;  Location: [facility];  Service: Open Heart Surgery;  Laterality: N/A;  . FACIAL COSMETIC SURGERY     face lift  . INTRAOPERATIVE TRANSESOPHAGEAL ECHOCARDIOGRAM N/A 07/17/2013   Procedure: INTRAOPERATIVE TRANSESOPHAGEAL ECHOCARDIOGRAM;  Surgeon: Ivin Poot, MD;  Location: [facility];  Service: Open Heart Surgery;  Laterality: N/A;  . LAPAROSCOPIC ASSISTED VAGINAL HYSTERECTOMY  06/21/2011   Procedure: LAPAROSCOPIC ASSISTED VAGINAL HYSTERECTOMY;  Surgeon: Maisie Fus;  Location: [facility];  Service: Gynecology;  Laterality: N/A;  . LEFT AND RIGHT HEART CATHETERIZATION WITH  CORONARY ANGIOGRAM N/A 06/27/2013   Procedure: LEFT AND RIGHT HEART CATHETERIZATION WITH CORONARY ANGIOGRAM;  Surgeon: Ramond Dial, MD;  Location: [facility] CATH [facility];  Service: Cardiovascular;  Laterality: N/A;  . NOSE SURGERY    . SALPINGOOPHORECTOMY  06/21/2011   Procedure: SALPINGO OOPHERECTOMY;  Surgeon: Maisie Fus;  Location: [facility];  Service: Gynecology;  Laterality: Bilateral;  . VAGINAL PROLAPSE REPAIR  06/21/2011   Procedure: SACROPEXY;  Surgeon: Maisie Fus;  Location: [facility];  Service: Gynecology;  Laterality: N/A;  sacrospinous ligament suspension     OB History   No obstetric history on file.      Home Medications    Prior to Admission medications   Medication Sig Start Date End Date Taking? Authorizing Provider  acetaminophen (TYLENOL) 500 MG tablet Take 500 mg by mouth every 6 (six) hours as needed for headache.    [provider]  albuterol (PROVENTIL) (2.5 MG/3ML) 0.083% nebulizer solution Take 3 mLs (2.5 mg total) by nebulization every 6 (six) hours as needed for wheezing. 11/23/16   Eugenie Filler, MD  alendronate (FOSAMAX) 70 MG tablet Take 70 mg by mouth once a week. Take with a full glass of water on an empty stomach.    [provider]  amLODipine (NORVASC) 10 MG tablet Take 1 tablet (10 mg total) by mouth daily. 10/19/15   Lelon Perla, MD  ANORO ELLIPTA 62.5-25 MCG/INH AEPB USE 1 INHALATION BY MOUTH  DAILY 03/27/18   Brand Males, MD  aspirin EC 81 MG tablet Take 81 mg by mouth at bedtime.    [provider]  buPROPion (WELLBUTRIN XL) 150 MG 24 hr tablet Take 150 mg by mouth daily. 06/04/17   [provider]  cholecalciferol (VITAMIN D) 1000 units tablet Take 1,000 Units by mouth at bedtime.     [provider]  dextromethorphan-guaiFENesin (MUCINEX DM) 30-600 MG 12hr tablet Take 1 tablet by mouth 2 (two) times daily. 09/20/18   Lavina Hamman, MD  Docusate Sodium (COLACE PO) Take 100 mg by mouth 2 (two)  times daily.     [provider]  feeding supplement, GLUCERNA SHAKE, (GLUCERNA SHAKE) LIQD Take 237 mLs by mouth 3 (three) times daily between meals. 09/20/18   Lavina Hamman, MD  Ferrous Sulfate (IRON) 325 (65 Fe) MG TABS Take 325 mg by mouth 2 (two) times daily.     [provider]  fluticasone (FLOVENT HFA) 220 MCG/ACT inhaler Inhale 2 puffs into the lungs 2 (two) times daily.    [provider]  hydrochlorothiazide (HYDRODIURIL) 12.5 MG tablet Take 12.5 mg by mouth every morning. 11/10/18   [provider]  irbesartan (AVAPRO) 300 MG tablet Take 300 mg by mouth daily.  10/05/18   [provider]  Magnesium 250 MG TABS Take 250 mg by mouth at bedtime.     [provider]  menthol-cetylpyridinium (CEPACOL) 3 MG lozenge Take 1 lozenge (3  mg total) by mouth as needed for sore throat. 09/20/18   Lavina Hamman, MD  metFORMIN (GLUCOPHAGE-XR) 500 MG 24 hr tablet TAKE 1 TABLET BY MOUTH EVERY DAY IN THE EVENING WITH DINNER 09/30/18   [provider]  Multiple Vitamins-Minerals ([place] PO) Take 1 capsule by mouth daily.     [provider]  pravastatin (PRAVACHOL) 40 MG tablet Take 40 mg by mouth every evening.     [provider]  predniSONE (DELTASONE) 10 MG tablet Take 4 tabs for 2 days, then 3 tabs for 2 days, then 2 tabs for 2 days, then 1 tab for 2 days, then stop 03/20/19   Fenton Foy, NP  PROAIR HFA 108 (90 BASE) MCG/ACT inhaler Inhale 2 puffs into the lungs 4 (four) times daily as needed for wheezing or shortness of breath.  02/24/14   [provider]  sertraline (ZOLOFT) 100 MG tablet Take 1 tablet (100 mg total) by mouth every morning. 11/24/16   Eugenie Filler, MD  Tiotropium Bromide-Olodaterol (STIOLTO RESPIMAT) 2.5-2.5 MCG/ACT AERS Inhale 2 puffs into the lungs daily. 08/14/18   Brand Males, MD    Family History Family History  Problem Relation Age of Onset  . Emphysema  Father   . Colon cancer Neg Hx   . Esophageal cancer Neg Hx   . Rectal cancer Neg Hx   . Stomach cancer Neg Hx     Social History Social History   Tobacco Use  . Smoking status: Former Smoker    Packs/day: 2.00    Years: 15.00    Pack years: 30.00    Types: Cigarettes    Quit date: 09/18/1972    Years since quitting: 46.5  . Smokeless tobacco: Never Used  Substance Use Topics  . Alcohol use: No    Alcohol/week: 0.0 standard drinks  . Drug use: No     Allergies   Lipitor [atorvastatin], Lisinopril, Amaryl [glimepiride], Anoro ellipta [umeclidinium-vilanterol], Avelox [moxifloxacin hcl in nacl], Ciprofloxacin, Other, Prevacid [lansoprazole], Spiriva handihaler [tiotropium bromide monohydrate], and Symbicort [budesonide-formoterol fumarate]   Review of Systems Review of Systems  All other systems reviewed and are negative.    Physical Exam Updated Vital Signs There were no vitals taken for this visit.  Physical Exam Vitals signs and nursing note reviewed.  Constitutional:      General: She is not in acute distress.    Appearance: Normal appearance. She is well-developed. She is not toxic-appearing.  HENT:     Head: Normocephalic and atraumatic.  Eyes:     General: Lids are normal.     Conjunctiva/sclera: Conjunctivae normal.     Pupils: Pupils are equal, round, and reactive to light.  Neck:     Musculoskeletal: Normal range of motion and neck supple.     Thyroid: No thyroid mass.     Trachea: No tracheal deviation.  Cardiovascular:     Rate and Rhythm: Normal rate and regular rhythm.     Heart sounds: Normal heart sounds. No murmur. No gallop.   Pulmonary:     Effort: Tachypnea, accessory muscle usage and respiratory distress present.     Breath sounds: No stridor. Examination of the right-upper field reveals rales. Examination of the left-upper field reveals rales. Rales present. No decreased breath sounds, wheezing or rhonchi.  Abdominal:     General: Bowel  sounds are normal. There is no distension.     Palpations: Abdomen is soft.     Tenderness: There is  no abdominal tenderness. There is no rebound.  Musculoskeletal: Normal range of motion.        General: No tenderness.  Skin:    General: Skin is warm and dry.     Findings: No abrasion or rash.  Neurological:     Mental Status: She is alert and oriented to person, place, and time.     GCS: GCS eye subscore is 4. GCS verbal subscore is 5. GCS motor subscore is 6.     Cranial Nerves: No cranial nerve deficit.     Sensory: No sensory deficit.  Psychiatric:        Speech: Speech normal.        Behavior: Behavior normal.      ED Treatments / Results  Labs (all labs ordered are listed, but only abnormal results are displayed) Labs Reviewed  SARS CORONAVIRUS 2 (HOSPITAL ORDER, [facility])  CBC WITH DIFFERENTIAL/PLATELET  COMPREHENSIVE METABOLIC PANEL  BRAIN NATRIURETIC PEPTIDE    EKG EKG Interpretation  Date/Time:  Friday March 21 2019 10:39:05 EDT Ventricular Rate:  96 PR Interval:    QRS Duration: 127 QT Interval:  389 QTC Calculation: 492 R Axis:   -87 Text Interpretation:  Sinus rhythm Left atrial enlargement RBBB and LAFB Inferior infarct, acute Probable posterior infarct, acute Lateral leads are also involved No significant change since last tracing Confirmed by Lacretia Leigh ([id number]) on 03/21/2019 10:43:48 AM   Radiology No results found.  Procedures Procedures (including critical care time)  Medications Ordered in ED Medications  0.9 %  sodium chloride infusion (has no administration in time range)     Initial Impression / Assessment and Plan / ED Course  I have reviewed the triage vital signs and the nursing notes.  Pertinent labs & imaging results that were available during my care of the patient were reviewed by me and considered in my medical decision making (see chart for details).        Patient started on IV antibiotics  for pneumonia that has been confirmed by CT.  Also treated for COPD exacerbation as well 2.  Patient's COVID test is negative and BNP is elevated 2.  Patient has oxygen requirement and will require inpatient admission  Final Clinical Impressions(s) / ED Diagnoses   Final diagnoses:  None    ED Discharge Orders    None       Lacretia Leigh, MD 03/21/19 1541

## 2019-03-21 NOTE — ED Notes (Signed)
Report given to rn on 5 w 

## 2019-03-21 NOTE — ED Notes (Signed)
O2 changed to [place] 4L/m/[place]

## 2019-03-21 NOTE — ED Triage Notes (Signed)
Pt here via private vehicle with resp distress, pt has gurgling resp, hx of COPD, 02 sats are in 70's and 80's on 2-3 L-- placed on NRB on arrival to room.

## 2019-03-21 NOTE — ED Notes (Signed)
The pt asking for pain in her back  Dr Zenia Resides has ordered  tylenol

## 2019-03-21 NOTE — H&P (Signed)
History and Physical    [name]:[id number] DOB: 29-Sep-1931 DOA: 03/21/2019  PCP: Harlan Stains, MD  Patient coming from: Home  I have personally briefly reviewed patient's old medical records in [place]  Chief Complaint: Respiratory distress with gurgling respirations and oxygen saturations in the 70s and 80s on 2 to 3 L of oxygen early this a.m.  HPI: [name] is a 85 y.o. female with medical history significant of stage I non-small cell lung cancer treated with radiation last treatments in May 2020, COPD followed by pulmonary on chronic oxygen 2 L at home, hypertension, diabetes type 2, aortic stenosis status post valve replacement, hyperlipidemia, and irritable bowel syndrome who presents emergency department in [place] with O2 sats in the 70s and 80s with respiratory distress.  Ends of a wet cough but is unable to produce sputum.  She denies any fevers or chills.  She has had no anginal type chest pain.  She notes that her dyspnea on exertion is present as well as having some orthopnea.  She was prescribed prednisone but has not had any relief with it she has been taking it for 2 days.  Current symptoms feel like her prior COPD exacerbations.  States that she has had significant weight loss and has have difficulty because when she swallows things feel like they are stuck.  Her husband says that her weight loss began long time prior to her receiving radiation treatments.  Patient states that her swallowing problems have been present for the last month which would correlate with her radiation treatments.  She did not receive any chemotherapy for her lung cancer.  Also on presentation her blood pressure is markedly elevated at 199/87 but patient states it runs that way all the time.  (?).  Patient also states that since starting the prednisone her sugars have been markedly elevated.  Of note all of her recent white blood cell counts are above normal and I suspect the patient  may have some mild amount of possibly CLL or CML.  ED Course: Patient noted to be in [place].  Presents with significant respiratory distress, lung examination consistent with rales.  Done for pneumonia which was conferred by CT.  Given steroids for COPD exacerbation.  COVID-19 testing is negative BNP is elevated.  Due to increased oxygen requirement patient is referred to me for further evaluation and management.  Review of Systems: As per HPI otherwise all other systems reviewed and  negative.   Past Medical History:  Diagnosis Date   Anxiety    Aortic stenosis    Arthritis    Asthma    uses inhaler as needed   Depression    Diabetes mellitus    Emphysema of lung (HCC)    GERD (gastroesophageal reflux disease)    Glaucoma    Hyperlipidemia    Hypertension    [name]    Murmur    Nephrolithiasis    Pneumonia 3/17 and 3/18    Past Surgical History:  Procedure Laterality Date   ABDOMINAL HYSTERECTOMY     ANKLE SURGERY  1998   d/t fx.  Left ankle   ANTERIOR AND POSTERIOR REPAIR  06/21/2011   Procedure: ANTERIOR (CYSTOCELE) AND POSTERIOR REPAIR (RECTOCELE);  Surgeon: Maisie Fus;  Location: [facility];  Service: Gynecology;  Laterality: N/A;   AORTIC VALVE REPLACEMENT N/A 07/17/2013   Procedure: AORTIC VALVE REPLACEMENT (AVR);  Surgeon: Ivin Poot, MD;  Location: [facility];  Service: Open  Heart Surgery;  Laterality: N/A;   CARDIAC CATHETERIZATION     CARDIAC VALVE REPLACEMENT     CORONARY ANGIOPLASTY     CORONARY ARTERY BYPASS GRAFT N/A 07/17/2013   Procedure: CORONARY ARTERY BYPASS GRAFT, TIMES ONE, ON PUMP, USING RIGHT INTERNAL MAMMARY ARTERY.;  Surgeon: Ivin Poot, MD;  Location: [facility];  Service: Open Heart Surgery;  Laterality: N/A;   FACIAL COSMETIC SURGERY     face lift   INTRAOPERATIVE TRANSESOPHAGEAL ECHOCARDIOGRAM N/A 07/17/2013   Procedure: INTRAOPERATIVE TRANSESOPHAGEAL ECHOCARDIOGRAM;  Surgeon: Ivin Poot,  MD;  Location: [facility];  Service: Open Heart Surgery;  Laterality: N/A;   LAPAROSCOPIC ASSISTED VAGINAL HYSTERECTOMY  06/21/2011   Procedure: LAPAROSCOPIC ASSISTED VAGINAL HYSTERECTOMY;  Surgeon: Maisie Fus;  Location: [facility];  Service: Gynecology;  Laterality: N/A;   LEFT AND RIGHT HEART CATHETERIZATION WITH CORONARY ANGIOGRAM N/A 06/27/2013   Procedure: LEFT AND RIGHT HEART CATHETERIZATION WITH CORONARY ANGIOGRAM;  Surgeon: Ramond Dial, MD;  Location[facility];  Service: Cardiovascular;  Laterality: N/A;   NOSE SURGERY     SALPINGOOPHORECTOMY  06/21/2011   Procedure: SALPINGO OOPHERECTOMY;  Surgeon: Maisie Fus;  Location: [facility];  Service: Gynecology;  Laterality: Bilateral;   VAGINAL PROLAPSE REPAIR  06/21/2011   Procedure: SACROPEXY;  Surgeon: Maisie Fus;  Location: [facility];  Service: Gynecology;  Laterality: N/A;  sacrospinous ligament suspension    Social History   Social History Narrative   Not on file     reports that she quit smoking about 46 years ago. Her smoking use included cigarettes. She has a 30.00 pack-year smoking history. She has never used smokeless tobacco. She reports that she does not drink alcohol or use drugs.  Allergies  Allergen Reactions   Lipitor [Atorvastatin] Other (See Comments)    myalgias myalgias   Lisinopril Other (See Comments)    cough cough   Amaryl [Glimepiride]     Side effects   Anoro Ellipta [Umeclidinium-Vilanterol]     Increase BP   Avelox [Moxifloxacin Hcl In Nacl]     nausea   Ciprofloxacin     nausea   Other Other (See Comments)    Increased cough   Prevacid [Lansoprazole]     diarrhea   Spiriva Handihaler [Tiotropium Bromide Monohydrate]     Increased cough   Symbicort [Budesonide-Formoterol Fumarate] Cough    Increased cough    Family History  Problem Relation Age of Onset   Emphysema Father    Colon cancer Neg Hx    Esophageal cancer Neg Hx    Rectal cancer Neg Hx    Stomach cancer  Neg Hx      Prior to Admission medications   Medication Sig Start Date End Date Taking? Authorizing Provider  albuterol (PROVENTIL) (2.5 MG/3ML) 0.083% nebulizer solution Take 3 mLs (2.5 mg total) by nebulization every 6 (six) hours as needed for wheezing. 11/23/16  Yes Eugenie Filler, MD  alendronate (FOSAMAX) 70 MG tablet Take 70 mg by mouth once a week. Take with a full glass of water on an empty stomach.   Yes [provider]  amLODipine (NORVASC) 10 MG tablet Take 1 tablet (10 mg total) by mouth daily. 10/19/15  Yes Crenshaw, Denice Bors, MD  ANORO ELLIPTA 62.5-25 MCG/INH AEPB USE 1 INHALATION BY MOUTH  DAILY Patient taking differently: Inhale 1 puff into the lungs daily.  03/27/18  Yes Brand Males, MD  aspirin EC 81 MG tablet Take 81 mg by mouth  at bedtime.   Yes [provider]  buPROPion (WELLBUTRIN XL) 150 MG 24 hr tablet Take 150 mg by mouth daily. 06/04/17  Yes [provider]  cholecalciferol (VITAMIN D) 1000 units tablet Take 1,000 Units by mouth at bedtime.    Yes [provider]  dextromethorphan-guaiFENesin (MUCINEX DM) 30-600 MG 12hr tablet Take 1 tablet by mouth 2 (two) times daily. 09/20/18  Yes Lavina Hamman, MD  Docusate Sodium (COLACE PO) Take 100 mg by mouth 2 (two) times daily.    Yes [provider]  feeding supplement, GLUCERNA SHAKE, (GLUCERNA SHAKE) LIQD Take 237 mLs by mouth 3 (three) times daily between meals. 09/20/18  Yes Lavina Hamman, MD  Ferrous Sulfate (IRON) 325 (65 Fe) MG TABS Take 325 mg by mouth 2 (two) times daily.    Yes [provider]  fluticasone (FLOVENT HFA) 220 MCG/ACT inhaler Inhale 2 puffs into the lungs 2 (two) times daily.   Yes [provider]  hydrochlorothiazide (HYDRODIURIL) 12.5 MG tablet Take 12.5 mg by mouth every morning. 11/10/18  Yes [provider]  irbesartan (AVAPRO) 300 MG tablet Take 300 mg by mouth daily.  10/05/18  Yes [provider]  Magnesium 250  MG TABS Take 250 mg by mouth at bedtime.    Yes [provider]  metFORMIN (GLUCOPHAGE-XR) 500 MG 24 hr tablet Take 500 mg by mouth every evening. With Dinner 09/30/18  Yes [provider]  Multiple Vitamins-Minerals ([place] PO) Take 1 capsule by mouth daily.    Yes [provider]  pravastatin (PRAVACHOL) 40 MG tablet Take 40 mg by mouth every evening.    Yes [provider]  predniSONE (DELTASONE) 10 MG tablet Take 4 tabs for 2 days, then 3 tabs for 2 days, then 2 tabs for 2 days, then 1 tab for 2 days, then stop 03/20/19  Yes Fenton Foy, NP  PROAIR HFA 108 (90 BASE) MCG/ACT inhaler Inhale 2 puffs into the lungs 4 (four) times daily as needed for wheezing or shortness of breath.  02/24/14  Yes [provider]  sertraline (ZOLOFT) 100 MG tablet Take 1 tablet (100 mg total) by mouth every morning. 11/24/16  Yes Eugenie Filler, MD  acetaminophen (TYLENOL) 500 MG tablet Take 500 mg by mouth every 6 (six) hours as needed for headache.    [provider]    Physical Exam:  Constitutional: Elderly female appears 77 years older than stated age in acute respiratory distress with accessory muscle use and requiring high flow oxygen at 4 L nasal cannula. Vitals:   03/21/19 1545 03/21/19 1615 03/21/19 1645 03/21/19 1700  BP: (!) 197/84 (!) 186/88 (!) 165/73 (!) 191/97  Pulse: 87 98 88   Resp: (!) 26 (!) 31 (!) 28   Temp:    97.7 F (36.5 C)  TempSrc:    Oral  SpO2: 95% 95% 92%    Eyes: PERRL, lids and conjunctivae normal ENMT: Mucous membranes are dry. Posterior pharynx clear of any exudate or lesions.Normal dentition.  Neck: normal, supple, no masses, no thyromegaly Respiratory: Coarse breath sounds throughout with decreased breath sounds at the bases, minimal wheezing, rales bilaterally halfway up.  Increased respiratory effort.  Moderate accessory muscle use.  Cardiovascular: Tachycardic rate and regular rhythm, no  murmurs / rubs / gallops. No extremity edema. 2+ pedal pulses. No carotid bruits.  Abdomen: no tenderness, no masses palpated. No hepatosplenomegaly. Bowel sounds positive.  Musculoskeletal: no clubbing / cyanosis.  No joint deformity upper and lower extremities. Good ROM, no contractures. Normal muscle tone.  Skin: no rashes, lesions, ulcers. No induration; significant tenting Neurologic: CN 2-12 grossly intact. Sensation intact, DTR normal. Strength 5/5 in all 4.  Psychiatric: Normal judgment and insight. Alert and oriented x 3. Normal mood.    Labs on Admission: I have personally reviewed following labs and imaging studies  CBC: Recent Labs  Lab 03/21/19 1052  WBC 33.3*  NEUTROABS 30.0*  HGB 11.8*  HCT 38.5  MCV 95.3  PLT 644   Basic Metabolic Panel: Recent Labs  Lab 03/21/19 1052  NA 139  K 4.2  CL 105  CO2 21*  GLUCOSE 338*  BUN 26*  CREATININE 1.09*  CALCIUM 9.9   Liver Function Tests: Recent Labs  Lab 03/21/19 1052  AST 40  ALT 48*  ALKPHOS 88  BILITOT 0.7  PROT 7.4  ALBUMIN 3.2*   Urine analysis:    Component Value Date/Time   COLORURINE YELLOW 03/02/2017 2156   APPEARANCEUR HAZY (A) 03/02/2017 2156   LABSPEC 1.014 03/02/2017 2156   PHURINE 5.0 03/02/2017 2156   GLUCOSEU NEGATIVE 03/02/2017 2156   HGBUR NEGATIVE 03/02/2017 2156   BILIRUBINUR NEGATIVE 03/02/2017 2156   [place] NEGATIVE 03/02/2017 2156   PROTEINUR 30 (A) 03/02/2017 2156   UROBILINOGEN 0.2 07/11/2013 1416   NITRITE NEGATIVE 03/02/2017 2156   LEUKOCYTESUR SMALL (A) 03/02/2017 2156    Radiological Exams on Admission: Ct Angio Chest Pe W/cm &/or Wo Cm  Result Date: 03/21/2019 CLINICAL DATA:  Shortness of breath. EXAM: CT ANGIOGRAPHY CHEST WITH CONTRAST TECHNIQUE: Multidetector CT imaging of the chest was performed using the standard protocol during bolus administration of intravenous contrast. Multiplanar CT image reconstructions and MIPs were obtained to evaluate the vascular  anatomy. CONTRAST:  182mL OMNIPAQUE IOHEXOL 350 MG/ML SOLN COMPARISON:  Radiograph of same day.  CT scan of Feb 06, 2019. FINDINGS: Cardiovascular: Satisfactory opacification of the pulmonary arteries to the segmental level. No evidence of pulmonary embolism. Normal heart size. No pericardial effusion. Enlargement of the main pulmonary artery is noted consistent with pulmonary artery hypertension. Atherosclerosis of thoracic aorta is noted without aneurysm formation. Status post aortic valve repair. Mediastinum/Nodes: Large sliding-type hiatal hernia is noted. No adenopathy is noted. Thyroid gland is unremarkable. Lungs/Pleura: Severe emphysematous disease is noted in both upper lobes. No pneumothorax is noted. Increased bilateral lower lobe airspace opacities are noted concerning for worsening atelectasis or possibly infiltrates. Upper Abdomen: No acute abnormality. Musculoskeletal: No chest wall abnormality. No acute or significant osseous findings. Review of the MIP images confirms the above findings. IMPRESSION: No definite evidence of pulmonary embolus. Increased bilateral lower lobe airspace opacities are noted concerning for worsening atelectasis or possibly pneumonia. Large sliding-type hiatal hernia. Enlargement of pulmonary artery is noted consistent with pulmonary artery hypertension. Status post aortic valve repair. Aortic Atherosclerosis (ICD10-I70.0) and Emphysema (ICD10-J43.9). Electronically Signed   By: Marijo Conception M.D.   On: 03/21/2019 15:30   Dg Chest Portable 1 View  Result Date: 03/21/2019 CLINICAL DATA:  Severe shortness of breath. EXAM: PORTABLE CHEST 1 VIEW COMPARISON:  CT chest dated Feb 06, 2019. Chest x-ray dated January 06, 2019. FINDINGS: The heart size and mediastinal contours are within normal limits. Prior aortic valve replacement. Atherosclerotic calcification of the aortic arch. Normal pulmonary vascularity. The lungs remain hyperinflated with severe bullous emphysematous  changes. Bibasilar scarring. No focal consolidation, pleural effusion, or pneumothorax. No acute osseous abnormality. IMPRESSION: 1. No active disease. 2. Severe COPD.  Electronically Signed   By: Titus Dubin M.D.   On: 03/21/2019 11:16    EKG: Independently reviewed.  Sinus rhythm with left atrial enlargement, right bundle branch block and left anterior fascicular block.  Inferior infarct, probable posterior infarct, no significant change since last tracing obtained on 2/13 2019.  Assessment/Plan Principal Problem:   Sepsis due to pneumonia [facility]) Active Problems:   Acute on chronic respiratory failure with hypoxia (HCC)   Recurrent pneumonia   Hypertensive urgency   COPD GOLD II    S/P AVR (aortic valve replacement)   COPD exacerbation (HCC)   CKD (chronic kidney disease), stage III (HCC)   Chronic diastolic CHF (congestive heart failure) (HCC)   Leukocytosis   Diabetes mellitus type 2, uncontrolled ([place])   Pulmonary HTN (HCC)   HLD (hyperlipidemia)   Non-small cell lung cancer ([place])    1.  Sepsis due to pneumonia: Patient will be admitted into the hospital on the sepsis protocol.  Due to her massively elevated blood pressures we will admit her to the progressive care unit.  She has received some fluid in the emergency department and will start her on an 800 mL/h.  Lactic acids are being checked.  Continue sepsis protocol.  2.  Acute on chronic respiratory failure with hypoxia: Patient has severe COPD with emphysema.  She requires 2 L of oxygen at home and is now requiring 4 L.  Placed her in progressive care monitor her pulmonary function as it may deteriorate before it gets better.  Continue oxygen supplementation.  3.  Recurrent pneumonia: I am concerned about the patient's ability to safely swallow given her complaints of things getting stuck as well as recent radiation therapy with multiple pneumonias.  Have speech therapy evaluate the patient.  We will start her on Rocephin and  azithromycin.  Will monitor her response to therapy.  4.  Hypertensive urgency: Start hydralazine 20 mg IV every 6 hours as needed systolic blood pressure greater than 170 monitor response to treatment if she does not respond may require a drip.  5.  Acute exacerbation of COPD with COPD Gold 2: Continue home management add nebulizers and order.  She is improving after steroids were given in the emergency department.  We will not order standing dose steroids as she received a course at home.  If wheezing tomorrow would consider additional steroid bolusing.  6.  Chronic kidney disease stage III: Nephrotoxic agents.  Monitor creatinine.  7.  Chronic diastolic congestive heart failure with preserved ejection fraction: Currently well compensated.  Continue supportive care.  8.  Leukocytosis: All of the patient's measured white blood cell counts are high or high normal.  I have ordered a peripheral smear to rule out CLL versus CML versus reactive leukocytosis.  9.  Diabetes mellitus type 2 uncontrolled: Likely related to recent steroid use patient will be given sliding scale insulin coverage we will continue home medications and hold metformin if taking.  10.  Pulmonary hypertension: April 2019 patient with echocardiogram that showed pulmonary artery peak pressure of 48.  Noted.  May be contributing to her shortness of breath  11.  Hyperlipidemia: Noted continue statin.  12.  Stage I non-small cell lung cancer: Treated with radiation last treatments were in May 2020.  Concerned that patient may have developed some esophageal radiation effects to her swallowing difficulties.  DVT prophylaxis: Lovenox Code Status: Full code Family Communication: Spoke with patient's husband Sharene Krikorian by telephone and updated him on patient's condition. Disposition Plan:  Likely home in 3 to 4 days Consults called: None Admission status: Inpatient Admit - It is my clinical opinion that admission to INPATIENT is  reasonable and necessary because of the expectation that this patient will require hospital care that crosses at least 2 midnights to treat this condition based on the medical complexity of the problems presented.  Given the aforementioned information, the predictability of an adverse outcome is felt to be significant.   Lady Deutscher MD FACP [facility] Pager [phone]  How to contact the [facility] Attending or Consulting provider [place] or covering provider during after hours [place], for this patient?  1. Check the care team in [facility] and look for a) attending/consulting [facility] provider listed and b) the [facility] team listed 2. Log into [email or web address] and use [place]'s universal password to access. If you do not have the password, please contact the hospital operator. 3. Locate the [facility] provider you are looking for under [facility] and page to a number that you can be directly reached. 4. If you still have difficulty reaching the provider, please page the [facility] (Director on Call) for the Hospitalists listed on amion for assistance.  If 7PM-7AM, please contact night-coverage [email or web address] Password [email or web address]  03/21/2019, 6:18 PM

## 2019-03-21 NOTE — ED Notes (Signed)
The pt is c/o the iv hurting her  Antibiotic going in at present  Heating pack placed over the iv site  No redness swelling or infiltrate .

## 2019-03-22 ENCOUNTER — Inpatient Hospital Stay (HOSPITAL_COMMUNITY): Payer: Medicare Other

## 2019-03-22 DIAGNOSIS — N183 Chronic kidney disease, stage 3 (moderate): Secondary | ICD-10-CM

## 2019-03-22 DIAGNOSIS — J9621 Acute and chronic respiratory failure with hypoxia: Secondary | ICD-10-CM

## 2019-03-22 DIAGNOSIS — D72829 Elevated white blood cell count, unspecified: Secondary | ICD-10-CM

## 2019-03-22 DIAGNOSIS — I160 Hypertensive urgency: Secondary | ICD-10-CM

## 2019-03-22 DIAGNOSIS — J441 Chronic obstructive pulmonary disease with (acute) exacerbation: Secondary | ICD-10-CM

## 2019-03-22 DIAGNOSIS — I5032 Chronic diastolic (congestive) heart failure: Secondary | ICD-10-CM

## 2019-03-22 LAB — CBC
HCT: 33.1 % — ABNORMAL LOW (ref 36.0–46.0)
Hemoglobin: 10.1 g/dL — ABNORMAL LOW (ref 12.0–15.0)
MCH: 29.2 pg (ref 26.0–34.0)
MCHC: 30.5 g/dL (ref 30.0–36.0)
MCV: 95.7 fL (ref 80.0–100.0)
Platelets: 305 10*3/uL (ref 150–400)
RBC: 3.46 MIL/uL — ABNORMAL LOW (ref 3.87–5.11)
RDW: 13.6 % (ref 11.5–15.5)
WBC: 28.1 10*3/uL — ABNORMAL HIGH (ref 4.0–10.5)
nRBC: 0 % (ref 0.0–0.2)

## 2019-03-22 LAB — PROTIME-INR
INR: 1.2 (ref 0.8–1.2)
Prothrombin Time: 14.9 seconds (ref 11.4–15.2)

## 2019-03-22 LAB — COMPREHENSIVE METABOLIC PANEL
ALT: 34 U/L (ref 0–44)
AST: 19 U/L (ref 15–41)
Albumin: 2.7 g/dL — ABNORMAL LOW (ref 3.5–5.0)
Alkaline Phosphatase: 74 U/L (ref 38–126)
Anion gap: 6 (ref 5–15)
BUN: 19 mg/dL (ref 8–23)
CO2: 25 mmol/L (ref 22–32)
Calcium: 8.9 mg/dL (ref 8.9–10.3)
Chloride: 109 mmol/L (ref 98–111)
Creatinine, Ser: 0.91 mg/dL (ref 0.44–1.00)
GFR calc Af Amer: >60 mL/min (ref >60)
GFR calc non Af Amer: 58 mL/min — ABNORMAL LOW (ref >60)
Glucose, Bld: 140 mg/dL — ABNORMAL HIGH (ref 70–99)
Potassium: 4.5 mmol/L (ref 3.5–5.1)
Sodium: 140 mmol/L (ref 135–145)
Total Bilirubin: 0.6 mg/dL (ref 0.3–1.2)
Total Protein: 6.1 g/dL — ABNORMAL LOW (ref 6.5–8.1)

## 2019-03-22 LAB — CORTISOL-AM, BLOOD: Cortisol - AM: 17 ug/dL (ref 6.7–22.6)

## 2019-03-22 LAB — HIV ANTIBODY (ROUTINE TESTING W REFLEX): HIV Screen 4th Generation wRfx: NONREACTIVE

## 2019-03-22 LAB — GLUCOSE, CAPILLARY
Glucose-Capillary: 127 mg/dL — ABNORMAL HIGH (ref 70–99)
Glucose-Capillary: 301 mg/dL — ABNORMAL HIGH (ref 70–99)
Glucose-Capillary: 138 mg/dL — ABNORMAL HIGH (ref 70–99)

## 2019-03-22 LAB — PROCALCITONIN: Procalcitonin: 0.66 ng/mL

## 2019-03-22 LAB — STREP PNEUMONIAE URINARY ANTIGEN: Strep Pneumo Urinary Antigen: NEGATIVE

## 2019-03-22 MED ORDER — METHYLPREDNISOLONE SODIUM SUCC 40 MG IJ SOLR
40.0000 mg | Freq: Three times a day (TID) | INTRAMUSCULAR | Status: DC
  Administered 2019-03-22 – 2019-03-23 (×3): 40 mg via INTRAVENOUS
  Filled 2019-03-22 (×3): qty 1

## 2019-03-22 MED ORDER — PROSIGHT PO TABS
1.0000 | ORAL_TABLET | Freq: Every day | ORAL | Status: DC
  Administered 2019-03-22 – 2019-03-24 (×3): 1 via ORAL
  Filled 2019-03-22 (×3): qty 1

## 2019-03-22 MED ORDER — RESOURCE THICKENUP CLEAR PO POWD
ORAL | Status: DC | PRN
  Administered 2019-03-23: 10:00:00 via ORAL
  Filled 2019-03-22: qty 125

## 2019-03-22 NOTE — Progress Notes (Signed)
Patient ID: [name], female   DOB: [birth year], 85 y.o.   MRN: [id number]  PROGRESS NOTE    [name]  ELF:[id number] DOB: [birth year] DOA: 03/21/2019 PCP: Harlan Stains, MD   Brief Narrative:  85 year old female with history of stage I non-small cell lung cancer status post radiation with last treatment in May 2020, COPD, chronic hypoxic respiratory failure on home oxygen 2 to 3 L/min, hypertension, diabetes mellitus type 2, aortic stenosis status post replacement, hyperlipidemia, irritable bowel syndrome presented with worsening shortness of breath and hypoxia on 03/21/2019.CT angiogram of the chest was negative for PE but showed increasing bilateral lower lobe opacities.  She was started on antibiotics.  Assessment & Plan:   Principal Problem:   Sepsis due to pneumonia [facility]) Active Problems:   COPD GOLD II    S/P AVR (aortic valve replacement)   COPD exacerbation (HCC)   Acute on chronic respiratory failure with hypoxia (HCC)   CKD (chronic kidney disease), stage III (HCC)   Diabetes mellitus type 2, uncontrolled (HCC)   Recurrent pneumonia   Chronic diastolic CHF (congestive heart failure) (HCC)   Pulmonary HTN (HCC)   HLD (hyperlipidemia)   Hypertensive urgency   Non-small cell lung cancer ([facility])   Leukocytosis  Sepsis: Present on admission, probably from pneumonia -Follow cultures.  Antibiotic plan as below.  Hemodynamically improving. -Decrease normal saline to 50 cc an hour  Bilateral community-acquired lower lobe pneumonia in a patient with recurrent pneumonia -She might have a component of aspiration pneumonia as well considering recent worsening dysphagia -Continue Rocephin and Zithromax. -Follow cultures.  Initial COVID-19 testing negative.  Procalcitonin 0.66 this morning. -Follow urine Legionella and streptococcal antigen. -Continue oxygen supplementation  Acute on chronic hypoxic respiratory failure Probable COPD exacerbation -Patient normally uses 2 to  3 L oxygen via nasal cannula at home -Currently on 4 L oxygen.  Wean as able.  Incentive spirometry -Continue neb treatments.  Was given a dose of Solu-Medrol in the ED.  Will start Solu-Medrol 40 mg IV every 8 hours.  Worsening dysphagia -Probably secondary to radiation changes to esophagus. -Currently n.p.o.  SLP evaluation.  Hypertensive urgency -Pressures still on the high side but improving.  Continue amlodipine, hydrochlorothiazide and irbesartan.  Monitor blood pressure  Chronic kidney disease stage III -Creatinine stable.  Monitor  Chronic diastolic heart failure Pulmonary hypertension -Currently compensated.  Continue supportive care.  Strict input and output and daily weights.  Outpatient follow-up with cardiology.  Echo on 01/01/2018 showed EF of 55 to 60% with grade 1 diastolic dysfunction and pulmonary artery peak pressure of 48  Leukocytosis -Has persistently elevated white cell count even as an outpatient.  Might have a component of CLL versus CML versus reactive leukocytosis.  Consider outpatient oncology evaluation.  Monitor WBCs.  Diabetes mellitus type 2 with hyperglycemia -Hemoglobin A1c 6.7.  Continue Accu-Cheks with sliding scale coverage  Hyperlipidemia -Continue statin  Stage I nonsmall cell lung cancer -Treated with radiation, last treatment in May 2020 -Outpatient follow-up with oncology  Generalized deconditioning Recent weight loss -PT eval -Overall prognosis is guarded to poor.  Patient has had progressive decline in her general health with decreasing appetite and progressive weight loss.  Will request cardiac care evaluation for goals of care discussion.    DVT prophylaxis: Lovenox Code Status: Full Family Communication: Asked the patient if she wanted me to talk to any family member.  She did not want me to talk to any family member today. Disposition Plan: Home in  1 to 2 days once clinically improves  Consultants: None  Procedures: None   Antimicrobials: Rocephin and Zithromax from 03/21/2019 onwards   Subjective: Patient seen and examined at bedside.  She feels slightly better.  Her breathing is slightly better.  Still intermittently coughing short of breath.  No overnight fever or vomiting.  As per the nursing staff, patient failed bedside swallowing eval.  Objective: Vitals:   03/21/19 2022 03/22/19 0402 03/22/19 0749 03/22/19 0916  BP:  (!) 188/76  (!) 165/74  Pulse:  79  73  Resp:  20  18  Temp:  98.2 F (36.8 C)  98.4 F (36.9 C)  TempSrc:  Oral  Oral  SpO2: 97% 95% 95%     Intake/Output Summary (Last 24 hours) at 03/22/2019 1020 Last data filed at 03/21/2019 1535 Gross per 24 hour  Intake 100 ml  Output -  Net 100 ml   There were no vitals filed for this visit.  Examination:  General exam: Appears calm and comfortable.  Looks very thinly built.  Elderly female. Respiratory system: Bilateral decreased breath sounds at bases with scattered crackles and some wheezing Cardiovascular system: S1 & S2 heard, Rate controlled Gastrointestinal system: Abdomen is nondistended, soft and nontender. Normal bowel sounds heard. Extremities: No cyanosis, clubbing, edema  Central nervous system: Alert and oriented. No focal neurological deficits. Moving extremities Skin: No rashes, lesions or ulcers Psychiatry: Judgement and insight appear normal. Mood & affect appropriate.     Data Reviewed: I have personally reviewed following labs and imaging studies  CBC: Recent Labs  Lab 03/21/19 1052 03/22/19 0351  WBC 33.3* 28.1*  NEUTROABS 30.0*  --   HGB 11.8* 10.1*  HCT 38.5 33.1*  MCV 95.3 95.7  PLT 375 387   Basic Metabolic Panel: Recent Labs  Lab 03/21/19 1052 03/21/19 1809 03/22/19 0351  NA 139  --  140  K 4.2  --  4.5  CL 105  --  109  CO2 21*  --  25  GLUCOSE 338*  --  140*  BUN 26*  --  19  CREATININE 1.09*  --  0.91  CALCIUM 9.9  --  8.9  MG  --  2.1  --    GFR: CrCl cannot be calculated  (Unknown ideal weight.). Liver Function Tests: Recent Labs  Lab 03/21/19 1052 03/22/19 0351  AST 40 19  ALT 48* 34  ALKPHOS 88 74  BILITOT 0.7 0.6  PROT 7.4 6.1*  ALBUMIN 3.2* 2.7*   No results for input(s): LIPASE, AMYLASE in the last 168 hours. No results for input(s): AMMONIA in the last 168 hours. Coagulation Profile: Recent Labs  Lab 03/22/19 0351  INR 1.2   Cardiac Enzymes: No results for input(s): CKTOTAL, CKMB, CKMBINDEX, TROPONINI in the last 168 hours. BNP (last 3 results) No results for input(s): PROBNP in the last 8760 hours. HbA1C: Recent Labs    03/21/19 1809  HGBA1C 6.7*   CBG: Recent Labs  Lab 03/21/19 2240 03/22/19 0910  GLUCAP 189* 127*   Lipid Profile: No results for input(s): CHOL, HDL, LDLCALC, TRIG, CHOLHDL, LDLDIRECT in the last 72 hours. Thyroid Function Tests: Recent Labs    03/21/19 1809  TSH 1.768   Anemia Panel: No results for input(s): VITAMINB12, FOLATE, FERRITIN, TIBC, IRON, RETICCTPCT in the last 72 hours. Sepsis Labs: Recent Labs  Lab 03/21/19 1152 03/21/19 1602 03/21/19 1830 03/21/19 2105 03/22/19 0351  PROCALCITON  --   --   --   --  0.66  LATICACIDVEN 2.0* 1.4 1.4 1.3  --     Recent Results (from the past 240 hour(s))  SARS Coronavirus 2 (CEPHEID- Performed in [place] hospital lab), Hosp Order     Status: None   Collection Time: 03/21/19 11:02 AM   Specimen: Nasopharyngeal Swab  Result Value Ref Range Status   SARS Coronavirus 2 NEGATIVE NEGATIVE Final    Comment: (NOTE) If result is NEGATIVE SARS-CoV-2 target nucleic acids are NOT DETECTED. The SARS-CoV-2 RNA is generally detectable in upper and lower  respiratory specimens during the acute phase of infection. The lowest  concentration of SARS-CoV-2 viral copies this assay can detect is 250  copies / mL. A negative result does not preclude SARS-CoV-2 infection  and should not be used as the sole basis for treatment or other  patient management  decisions.  A negative result may occur with  improper specimen collection / handling, submission of specimen other  than nasopharyngeal swab, presence of viral mutation(s) within the  areas targeted by this assay, and inadequate number of viral copies  (<250 copies / mL). A negative result must be combined with clinical  observations, patient history, and epidemiological information. If result is POSITIVE SARS-CoV-2 target nucleic acids are DETECTED. The SARS-CoV-2 RNA is generally detectable in upper and lower  respiratory specimens dur ing the acute phase of infection.  Positive  results are indicative of active infection with SARS-CoV-2.  Clinical  correlation with patient history and other diagnostic information is  necessary to determine patient infection status.  Positive results do  not rule out bacterial infection or co-infection with other viruses. If result is PRESUMPTIVE POSTIVE SARS-CoV-2 nucleic acids MAY BE PRESENT.   A presumptive positive result was obtained on the submitted specimen  and confirmed on repeat testing.  While 2019 novel coronavirus  (SARS-CoV-2) nucleic acids may be present in the submitted sample  additional confirmatory testing may be necessary for epidemiological  and / or clinical management purposes  to differentiate between  SARS-CoV-2 and other Sarbecovirus currently known to infect humans.  If clinically indicated additional testing with an alternate test  methodology [phone]) is advised. The SARS-CoV-2 RNA is generally  detectable in upper and lower respiratory sp ecimens during the acute  phase of infection. The expected result is Negative. Fact Sheet for Patients:  StrictlyIdeas.no Fact Sheet for Healthcare Providers: BankingDealers.co.za This test is not yet approved or cleared by the [place] FDA and has been authorized for detection and/or diagnosis of SARS-CoV-2 by FDA under an  Emergency Use Authorization (EUA).  This EUA will remain in effect (meaning this test can be used) for the duration of the COVID-19 declaration under Section 564(b)(1) of the Act, 21 U.S.C. section 360bbb-3(b)(1), unless the authorization is terminated or revoked sooner. Performed at [facility], [place] [address], [place], [place] [address]          Radiology Studies: Ct Angio Chest Pe W/cm &/or Wo Cm  Result Date: 03/21/2019 CLINICAL DATA:  Shortness of breath. EXAM: CT ANGIOGRAPHY CHEST WITH CONTRAST TECHNIQUE: Multidetector CT imaging of the chest was performed using the standard protocol during bolus administration of intravenous contrast. Multiplanar CT image reconstructions and MIPs were obtained to evaluate the vascular anatomy. CONTRAST:  1109mL OMNIPAQUE IOHEXOL 350 MG/ML SOLN COMPARISON:  Radiograph of same day.  CT scan of Feb 06, 2019. FINDINGS: Cardiovascular: Satisfactory opacification of the pulmonary arteries to the segmental level. No evidence of pulmonary embolism. Normal heart size. No pericardial effusion. Enlargement  of the main pulmonary artery is noted consistent with pulmonary artery hypertension. Atherosclerosis of thoracic aorta is noted without aneurysm formation. Status post aortic valve repair. Mediastinum/Nodes: Large sliding-type hiatal hernia is noted. No adenopathy is noted. Thyroid gland is unremarkable. Lungs/Pleura: Severe emphysematous disease is noted in both upper lobes. No pneumothorax is noted. Increased bilateral lower lobe airspace opacities are noted concerning for worsening atelectasis or possibly infiltrates. Upper Abdomen: No acute abnormality. Musculoskeletal: No chest wall abnormality. No acute or significant osseous findings. Review of the MIP images confirms the above findings. IMPRESSION: No definite evidence of pulmonary embolus. Increased bilateral lower lobe airspace opacities are noted concerning for worsening atelectasis or possibly  pneumonia. Large sliding-type hiatal hernia. Enlargement of pulmonary artery is noted consistent with pulmonary artery hypertension. Status post aortic valve repair. Aortic Atherosclerosis (ICD10-I70.0) and Emphysema (ICD10-J43.9). Electronically Signed   By: Marijo Conception M.D.   On: 03/21/2019 15:30   Dg Chest Portable 1 View  Result Date: 03/21/2019 CLINICAL DATA:  Severe shortness of breath. EXAM: PORTABLE CHEST 1 VIEW COMPARISON:  CT chest dated Feb 06, 2019. Chest x-ray dated January 06, 2019. FINDINGS: The heart size and mediastinal contours are within normal limits. Prior aortic valve replacement. Atherosclerotic calcification of the aortic arch. Normal pulmonary vascularity. The lungs remain hyperinflated with severe bullous emphysematous changes. Bibasilar scarring. No focal consolidation, pleural effusion, or pneumothorax. No acute osseous abnormality. IMPRESSION: 1. No active disease. 2. Severe COPD. Electronically Signed   By: Titus Dubin M.D.   On: 03/21/2019 11:16        Scheduled Meds: . amLODipine  10 mg Oral Daily  . aspirin EC  81 mg Oral QHS  . budesonide (PULMICORT) nebulizer solution  1 mg Nebulization BID  . buPROPion  150 mg Oral Daily  . cholecalciferol  1,000 Units Oral QHS  . docusate sodium  100 mg Oral BID  . enoxaparin (LOVENOX) injection  40 mg Subcutaneous Q24H  . feeding supplement (GLUCERNA SHAKE)  237 mL Oral TID BM  . ferrous sulfate  325 mg Oral BID WC  . hydrochlorothiazide  12.5 mg Oral q morning - 10a  . insulin aspart  0-5 Units Subcutaneous QHS  . insulin aspart  0-9 Units Subcutaneous TID WC  . irbesartan  300 mg Oral Daily  . magnesium oxide  200 mg Oral QHS  . methylPREDNISolone (SOLU-MEDROL) injection  125 mg Intravenous Once  . multivitamin  1 tablet Oral Daily  . pravastatin  40 mg Oral QPM  . sertraline  100 mg Oral q morning - 10a  . sodium chloride flush  3 mL Intravenous Q12H  . umeclidinium-vilanterol  1 puff Inhalation Daily    Continuous Infusions: . 0.9 % NaCl with KCl 20 mEq / L 100 mL/hr at 03/21/19 1850  . azithromycin 500 mg (03/21/19 1529)  . cefTRIAXone (ROCEPHIN)  IV Stopped (03/21/19 1535)     LOS: 1 day        Aline August, MD [facility] 03/22/2019, 10:20 AM

## 2019-03-22 NOTE — Progress Notes (Signed)
Modified Barium Swallow Progress Note  Patient Details  Name: [name] MRN: [id number] Date of Birth: April 25, 1932  Today's Date: 03/22/2019  Modified Barium Swallow completed.  Full report located under Chart Review in the Imaging Section.  Brief recommendations include the following:  Clinical Impression  MBS was completed using thin liquids via spoon and cup, nectar thick liquids via spoon and cup, honey thick liquids via spoon and cup, pureed material and dual textures solids.   The patient presented with a mild oral and moderate pharyngeal dysphagia.  The oral phase was marked by decreased control with material falling into the buccal area and under her tongue.  She was also slow to move material.  The pharyngeal phase was marked by decreased anterior hyoid excursion, decreased laryngeal elevation, decreased epiglottic movement, decreased laryngeal vestibule closure and decreased base of tongue retraction.  Epiglottic movement did improve some with a heavier boluses.  Mild-severe vallecula residue was seen across textures with the thicker material leaving worse residue.  Moderate pyrifrom sinus residue was seen for pureed material.  The patient was not sensate to this residue.  Cued second swallow was not effective to assist with clearance of all the residue.  Penetration to the level of the vocal folds that was intermittently sensed was seen prior to the swallow given thin liuqids via spoon and cup sips.  Penetration during the swallow was seen given honey thick liquids via spoon sips.  Cued cough and reswallow was generally able to clear most material from the vestibule except for a small amount that was noted to remain on the vocal folds.  No aspiration was seen.  Chin tuck was attempted and not helpful to prevent the penetration.  Esophageal sweep revealed it slow to clear wtih possible spasms.  Full assessment of esophageal function may be beneficial.  Recommend a dysphagia 3 diet with honey  thick liquids.  ST will follow for therapeutic diet tolerance and initiation of swallowing therapy.  She would benefit from ST therapy after discharge and prefers to do outpatient therapy.     Swallow Evaluation Recommendations   Recommended Consults: Consider GI evaluation   SLP Diet Recommendations: Dysphagia 3 (Mech soft) solids;Honey thick liquids   Liquid Administration via: Cup   Medication Administration: Crushed with puree   Supervision: Patient able to self feed   Compensations: Slow rate;Small sips/bites   Postural Changes: Remain semi-upright after after feeds/meals (Comment);Seated upright at 90 degrees   Oral Care Recommendations: Oral care BID   Other Recommendations: Order thickener from pharmacy;Prohibited food (jello, ice cream, thin soups)   Shelly Flatten, MA, CCC-SLP Acute Rehab SLP [phone]  Lamar Sprinkles 03/22/2019,11:27 AM

## 2019-03-22 NOTE — Evaluation (Signed)
Clinical/Bedside Swallow Evaluation Patient Details  Name: [name] MRN: [id number] Date of Birth: 1931/12/23  Today's Date: 03/22/2019 Time: SLP Start Time (ACUTE ONLY): 0909 SLP Stop Time (ACUTE ONLY): 0935 SLP Time Calculation (min) (ACUTE ONLY): 26 min  Past Medical History:  Past Medical History:  Diagnosis Date  . Anxiety   . Aortic stenosis   . Arthritis   . Asthma    uses inhaler as needed  . Depression   . Diabetes mellitus   . Emphysema of lung ([place])   . GERD (gastroesophageal reflux disease)   . Glaucoma   . Hyperlipidemia   . Hypertension   . [name]   . Murmur   . Nephrolithiasis   . Pneumonia 3/17 and 3/18   Past Surgical History:  Past Surgical History:  Procedure Laterality Date  . ABDOMINAL HYSTERECTOMY    . ANKLE SURGERY  1998   d/t fx.  Left ankle  . ANTERIOR AND POSTERIOR REPAIR  06/21/2011   Procedure: ANTERIOR (CYSTOCELE) AND POSTERIOR REPAIR (RECTOCELE);  Surgeon: Maisie Fus;  Location: [facility];  Service: Gynecology;  Laterality: N/A;  . AORTIC VALVE REPLACEMENT N/A 07/17/2013   Procedure: AORTIC VALVE REPLACEMENT (AVR);  Surgeon: Ivin Poot, MD;  Location: [place];  Service: Open Heart Surgery;  Laterality: N/A;  . CARDIAC CATHETERIZATION    . CARDIAC VALVE REPLACEMENT    . CORONARY ANGIOPLASTY    . CORONARY ARTERY BYPASS GRAFT N/A 07/17/2013   Procedure: CORONARY ARTERY BYPASS GRAFT, TIMES ONE, ON PUMP, USING RIGHT INTERNAL MAMMARY ARTERY.;  Surgeon: Ivin Poot, MD;  Location: [facility];  Service: Open Heart Surgery;  Laterality: N/A;  . FACIAL COSMETIC SURGERY     face lift  . INTRAOPERATIVE TRANSESOPHAGEAL ECHOCARDIOGRAM N/A 07/17/2013   Procedure: INTRAOPERATIVE TRANSESOPHAGEAL ECHOCARDIOGRAM;  Surgeon: Ivin Poot, MD;  Location: [facility];  Service: Open Heart Surgery;  Laterality: N/A;  . LAPAROSCOPIC ASSISTED VAGINAL HYSTERECTOMY  06/21/2011   Procedure: LAPAROSCOPIC ASSISTED VAGINAL HYSTERECTOMY;   Surgeon: Maisie Fus;  Location: [facility];  Service: Gynecology;  Laterality: N/A;  . LEFT AND RIGHT HEART CATHETERIZATION WITH CORONARY ANGIOGRAM N/A 06/27/2013   Procedure: LEFT AND RIGHT HEART CATHETERIZATION WITH CORONARY ANGIOGRAM;  Surgeon: Ramond Dial, MD;  Location: [facility];  Service: Cardiovascular;  Laterality: N/A;  . NOSE SURGERY    . SALPINGOOPHORECTOMY  06/21/2011   Procedure: SALPINGO OOPHERECTOMY;  Surgeon: Maisie Fus;  Location: [facility];  Service: Gynecology;  Laterality: Bilateral;  . VAGINAL PROLAPSE REPAIR  06/21/2011   Procedure: SACROPEXY;  Surgeon: Maisie Fus;  Location: [facility];  Service: Gynecology;  Laterality: N/A;  sacrospinous ligament suspension   HPI:  [name] is a 85 y.o. female with medical history significant of stage I non-small cell lung cancer treated with radiation; last treatments in May 2020, COPD followed by pulmonary on chronic oxygen 2 L at home, hypertension, diabetes type 2, aortic stenosis status post valve replacement, hyperlipidemia, and irritable bowel syndrome who presents emergency department in [place] with O2 sats in the 70s and 80s with respiratory distress.  Complains of a wet cough but is unable to produce sputum.  She denies any fevers or chills.  She has had no anginal type chest pain.  She notes that her dyspnea on exertion is present as well as having some orthopnea.  She was prescribed prednisone but has not had any relief with it she has been taking it for  2 days.  Current symptoms feel like her prior COPD exacerbations.  States that she has had significant weight loss and has have difficulty because when she swallows things feel like they are stuck.  Her husband says that her weight loss began long time prior to her receiving radiation treatments.  Patient states that her swallowing problems have been present for the last month which would correlate with her radiation treatments.  She did not receive any chemotherapy for  her lung cancer.  Also on presentation her blood pressure is markedly elevated at 199/87 but patient states it runs that way all the time.  (?).  Patient also states that since starting the prednisone her sugars have been markedly elevated.  Of note all of her recent white blood cell counts are above normal and I suspect the patient may have some mild amount of possibly CLL or CML.  Chest xray was showing no active disease, severe COPD.     Assessment / Plan / Recommendation Clinical Impression  Clinical swallowing evaluation was completed using thin liquids via spoon, cup and straw, nectar thick liquids via cup and straw, pureed material and dual textured solids.  The patient endorsed trouble swallowing of about a 1 month duration characterized by food sticking and strangling.  She recently finished up radiation treatments for lung cancer.  Cranial nerve exam was completed and unremarkable.  Lingual, labial, jaw and facial range of motion and strenth were adequate.  Sensation appeared to be intact. She presented with a possible oropharyngeal dysphagia.  The oral phase was marked by delayed oral transit particularly for solids.  The pharyngeal phase was marked by sluggish hyolaryngeal movement and a possible delayed swallow trigger.  The patient looked uncomfortable and as if swallowing was hard for every swallow.  Immediate cough seen for thin via spoon and cup which was not seen given small straw sips.  Throat clear was seen given nectar thick liquids.  She was also noted to belch.  Given results of evaluation recommend that she remain NPO except medications in pureed material pending MBS.  SLP Visit Diagnosis: Dysphagia, unspecified (R13.10)    Aspiration Risk  Moderate aspiration risk    Diet Recommendation   NPO pending MBS results  Medication Administration: Crushed with puree    Other  Recommendations Oral Care Recommendations: Oral care QID   Follow up Recommendations Other (comment)(TBD)              Prognosis Prognosis for Safe Diet Advancement: Good      Swallow Study   General Date of Onset: 03/21/19 HPI: [name] is a 85 y.o. female with medical history significant of stage I non-small cell lung cancer treated with radiation; last treatments in May 2020, COPD followed by pulmonary on chronic oxygen 2 L at home, hypertension, diabetes type 2, aortic stenosis status post valve replacement, hyperlipidemia, and irritable bowel syndrome who presents emergency department in [place] with O2 sats in the 70s and 80s with respiratory distress.  Complains of a wet cough but is unable to produce sputum.  She denies any fevers or chills.  She has had no anginal type chest pain.  She notes that her dyspnea on exertion is present as well as having some orthopnea.  She was prescribed prednisone but has not had any relief with it she has been taking it for 2 days.  Current symptoms feel like her prior COPD exacerbations.  States that she has had significant weight loss and has have difficulty  because when she swallows things feel like they are stuck.  Her husband says that her weight loss began long time prior to her receiving radiation treatments.  Patient states that her swallowing problems have been present for the last month which would correlate with her radiation treatments.  She did not receive any chemotherapy for her lung cancer.  Also on presentation her blood pressure is markedly elevated at 199/87 but patient states it runs that way all the time.  (?).  Patient also states that since starting the prednisone her sugars have been markedly elevated.  Of note all of her recent white blood cell counts are above normal and I suspect the patient may have some mild amount of possibly CLL or CML.  Chest xray was showing no active disease, severe COPD.   Type of Study: Bedside Swallow Evaluation Previous Swallow Assessment: 2014 with final recommendation for Dysphagia 3 with thin  liquids. Diet Prior to this Study: NPO Temperature Spikes Noted: No Respiratory Status: Nasal cannula History of Recent Intubation: No Behavior/Cognition: Alert;Cooperative;Pleasant mood Oral Cavity Assessment: Dry Oral Care Completed by SLP: No Oral Cavity - Dentition: Dentures, top;Dentures, bottom Vision: Functional for self-feeding Self-Feeding Abilities: Able to feed self Patient Positioning: Upright in bed Baseline Vocal Quality: Normal Volitional Swallow: Able to elicit    Oral/Motor/Sensory Function Overall Oral Motor/Sensory Function: Within functional limits   Ice Chips Ice chips: Not tested   Thin Liquid Thin Liquid: Impaired Presentation: Cup;Self Fed;Spoon;Straw Pharyngeal  Phase Impairments: Suspected delayed Swallow;Decreased hyoid-laryngeal movement;Cough - Immediate    Nectar Thick Nectar Thick Liquid: Impaired Presentation: Cup;Straw;Self Fed Pharyngeal Phase Impairments: Suspected delayed Swallow;Decreased hyoid-laryngeal movement;Throat Clearing - Delayed   Honey Thick Honey Thick Liquid: Not tested   Puree Puree: Impaired Presentation: Spoon Pharyngeal Phase Impairments: Suspected delayed Swallow;Decreased hyoid-laryngeal movement   Solid     Solid: Impaired Presentation: Spoon Oral Phase Impairments: Impaired mastication Oral Phase Functional Implications: Prolonged oral transit Pharyngeal Phase Impairments: Suspected delayed Swallow;Decreased hyoid-laryngeal movement     Shelly Flatten, MA, CCC-SLP Acute Rehab SLP [phone]  Lamar Sprinkles 03/22/2019,9:45 AM

## 2019-03-22 NOTE — Evaluation (Signed)
Physical Therapy Evaluation Patient Details Name: [name] MRN: [id number] DOB: [birth year] Today's Date: 03/22/2019   History of Present Illness  Patient is a 85 y/o female who presents with SOB and hypoxia. CT angiogram of the chest-negative for PE but showed increasing bilateral lower lobe opacities. Also with acute on chronic respiratory failure. PMH includes COPD, depression, HTN, DM, CAD s/p CABG, stage I non-small cell lung cancer s/p radiation with last treatment in May 2020, aortic stenosis s/p replacement, HLD.  Clinical Impression  Patient presents with generalized weakness, impaired balance, dyspnea on exertion, decreased endurance and impaired mobility s/p above. Pt reports being independent PTA and lives with spouse. Declines using any DME but does report multiple falls at home (2 in last 2 weeks). Also states she has been couch bound the last 2 weeks due to not feeling well. Today, pt requires Min A for transfers and gait training using RW for support. 2/4 DOE noted with Sp02 >92% on 4L/min 02. Encouraged OOB to chair for all meals and increasing activity. If pt has hands on assist at home safe to d/c home with HHPT. However if pt cannot provide short term hands on assist for mobility, will likely need SNF. Will follow acutely to maximize independence and mobility prior to return home.    Follow Up Recommendations Home health PT;Supervision for mobility/OOB;Supervision/Assistance - 24 hour    Equipment Recommendations  None recommended by PT    Recommendations for Other Services       Precautions / Restrictions Precautions Precautions: Fall Precaution Comments: hx of falls Restrictions Weight Bearing Restrictions: No      Mobility  Bed Mobility Overal bed mobility: Needs Assistance Bed Mobility: Supine to Sit     Supine to sit: Min guard;HOB elevated     General bed mobility comments: No assist needed, use of rail.  Transfers Overall transfer level: Needs  assistance Equipment used: None Transfers: Sit to/from Stand Sit to Stand: Min assist         General transfer comment: assist to power to standing with pt reaching for bed rail for support. Eventually asking for RW.  Ambulation/Gait Ambulation/Gait assistance: Min assist Gait Distance (Feet): 50 Feet Assistive device: Rolling walker (2 wheeled) Gait Pattern/deviations: Step-through pattern;Decreased stride length;Trunk flexed;Staggering right;Staggering left Gait velocity: decreased Gait velocity interpretation: <1.31 ft/sec, indicative of household ambulator General Gait Details: Slow, unsteady gait with Min A for balance. Cues for upright posture and RW proximity/management. Difficulty with turns. SP02 remained >92% on 4L/min 02.  Stairs            Wheelchair Mobility    Modified Rankin (Stroke Patients Only)       Balance Overall balance assessment: Needs assistance;History of Falls Sitting-balance support: Feet supported;No upper extremity supported Sitting balance-Leahy Scale: Good Sitting balance - Comments: Able to reach outside BoS and donn socks without difficulty.   Standing balance support: During functional activity Standing balance-Leahy Scale: Poor Standing balance comment: Requires UE support or external support in standing statically and dynamically.                             Pertinent Vitals/Pain Pain Assessment: No/denies pain    Home Living Family/patient expects to be discharged to:: Private residence Living Arrangements: Spouse/significant other Available Help at Discharge: Family;Available 24 hours/day Type of Home: House Home Access: Stairs to enter Entrance Stairs-Rails: Right Entrance Stairs-Number of Steps: 3+ 4 Home Layout: Two level;Laundry or work  area in basement;Able to live on main level with bedroom/bathroom Home Equipment: Grab bars - tub/shower;Walker - 2 wheels;Walker - 4 wheels;Cane - single point       Prior Function Level of Independence: Independent         Comments: Does her own ADLs, does not do any IADLs. Does not drive.  per reports had 2 falls in last 2 weeks. Does not use DME and likely should. Reports currently doing cardiopulmonary rehab at [facility] but has missed last 2 appts due to PNA.     Hand Dominance   Dominant Hand: Right    Extremity/Trunk Assessment   Upper Extremity Assessment Upper Extremity Assessment: Defer to OT evaluation    Lower Extremity Assessment Lower Extremity Assessment: Generalized weakness    Cervical / Trunk Assessment Cervical / Trunk Assessment: Kyphotic  Communication   Communication: No difficulties  Cognition Arousal/Alertness: Awake/alert Behavior During Therapy: WFL for tasks assessed/performed Overall Cognitive Status: Impaired/Different from baseline Area of Impairment: Safety/judgement                         Safety/Judgement: Decreased awareness of safety     General Comments: Reports not using any DME but reports falls at home.      General Comments      Exercises     Assessment/Plan    PT Assessment Patient needs continued PT services  PT Problem List Decreased strength;Decreased mobility;Decreased safety awareness;Cardiopulmonary status limiting activity;Decreased cognition;Decreased balance;Decreased activity tolerance;Decreased knowledge of use of DME       PT Treatment Interventions Therapeutic activities;DME instruction;Gait training;Therapeutic exercise;Patient/family education;Balance training;Functional mobility training;Stair training;Neuromuscular re-education    PT Goals (Current goals can be found in the Care Plan section)  Acute Rehab PT Goals Patient Stated Goal: to be able to breathe and go home PT Goal Formulation: With patient Time For Goal Achievement: 04/05/19 Potential to Achieve Goals: Good    Frequency Min 3X/week   Barriers to discharge Inaccessible home  environment stairs to enter    Co-evaluation               AM-PAC PT "6 Clicks" Mobility  Outcome Measure Help needed turning from your back to your side while in a flat bed without using bedrails?: A Little Help needed moving from lying on your back to sitting on the side of a flat bed without using bedrails?: A Little Help needed moving to and from a bed to a chair (including a wheelchair)?: A Little Help needed standing up from a chair using your arms (e.g., wheelchair or bedside chair)?: A Little Help needed to walk in hospital room?: A Lot Help needed climbing 3-5 steps with a railing? : A Lot 6 Click Score: 16    End of Session Equipment Utilized During Treatment: Oxygen;Gait belt Activity Tolerance: Patient limited by fatigue;Patient tolerated treatment well Patient left: in chair;with call bell/phone within reach;with chair alarm set Nurse Communication: Mobility status(pure wick) PT Visit Diagnosis: Unsteadiness on feet (R26.81);Muscle weakness (generalized) (M62.81);Other (comment)(DOE)    Time: 1157-2620 PT Time Calculation (min) (ACUTE ONLY): 22 min   Charges:   PT Evaluation $PT Eval Low Complexity: 1 Low          Wray Kearns, PT, DPT Acute Rehabilitation Services Pager [phone] Office [place] 03/22/2019, 1:52 PM

## 2019-03-23 DIAGNOSIS — Z515 Encounter for palliative care: Secondary | ICD-10-CM

## 2019-03-23 DIAGNOSIS — Z7189 Other specified counseling: Secondary | ICD-10-CM

## 2019-03-23 LAB — BASIC METABOLIC PANEL
Anion gap: 9 (ref 5–15)
BUN: 28 mg/dL — ABNORMAL HIGH (ref 8–23)
CO2: 21 mmol/L — ABNORMAL LOW (ref 22–32)
Calcium: 9.4 mg/dL (ref 8.9–10.3)
Chloride: 109 mmol/L (ref 98–111)
Creatinine, Ser: 1.01 mg/dL — ABNORMAL HIGH (ref 0.44–1.00)
GFR calc Af Amer: 59 mL/min — ABNORMAL LOW (ref >60)
GFR calc non Af Amer: 51 mL/min — ABNORMAL LOW (ref >60)
Glucose, Bld: 205 mg/dL — ABNORMAL HIGH (ref 70–99)
Potassium: 4.8 mmol/L (ref 3.5–5.1)
Sodium: 139 mmol/L (ref 135–145)

## 2019-03-23 LAB — CBC WITH DIFFERENTIAL/PLATELET
Abs Immature Granulocytes: 0.14 10*3/uL — ABNORMAL HIGH (ref 0.00–0.07)
Basophils Absolute: 0 10*3/uL (ref 0.0–0.1)
Basophils Relative: 0 %
Eosinophils Absolute: 0 10*3/uL (ref 0.0–0.5)
Eosinophils Relative: 0 %
HCT: 32.4 % — ABNORMAL LOW (ref 36.0–46.0)
Hemoglobin: 10.1 g/dL — ABNORMAL LOW (ref 12.0–15.0)
Immature Granulocytes: 1 %
Lymphocytes Relative: 9 %
Lymphs Abs: 1 10*3/uL (ref 0.7–4.0)
MCH: 29.3 pg (ref 26.0–34.0)
MCHC: 31.2 g/dL (ref 30.0–36.0)
MCV: 93.9 fL (ref 80.0–100.0)
Monocytes Absolute: 0.4 10*3/uL (ref 0.1–1.0)
Monocytes Relative: 3 %
Neutro Abs: 10.2 10*3/uL — ABNORMAL HIGH (ref 1.7–7.7)
Neutrophils Relative %: 87 %
Platelets: 327 10*3/uL (ref 150–400)
RBC: 3.45 MIL/uL — ABNORMAL LOW (ref 3.87–5.11)
RDW: 13.6 % (ref 11.5–15.5)
WBC: 11.7 10*3/uL — ABNORMAL HIGH (ref 4.0–10.5)
nRBC: 0 % (ref 0.0–0.2)

## 2019-03-23 LAB — GLUCOSE, CAPILLARY
Glucose-Capillary: 162 mg/dL — ABNORMAL HIGH (ref 70–99)
Glucose-Capillary: 199 mg/dL — ABNORMAL HIGH (ref 70–99)
Glucose-Capillary: 296 mg/dL — ABNORMAL HIGH (ref 70–99)
Glucose-Capillary: 175 mg/dL — ABNORMAL HIGH (ref 70–99)

## 2019-03-23 LAB — MAGNESIUM: Magnesium: 2.2 mg/dL (ref 1.7–2.4)

## 2019-03-23 MED ORDER — METHYLPREDNISOLONE SODIUM SUCC 40 MG IJ SOLR
40.0000 mg | Freq: Two times a day (BID) | INTRAMUSCULAR | Status: DC
  Administered 2019-03-23 – 2019-03-24 (×2): 40 mg via INTRAVENOUS
  Filled 2019-03-23 (×2): qty 1

## 2019-03-23 NOTE — Consult Note (Signed)
Consultation Note Date: 03/23/2019   Patient Name: [name]  DOB: 03/23/1932  MRN: [id number]  Age / Sex: 85 y.o., female   PCP: Desiree Stains, MD Referring Physician: Aline August, MD   REASON FOR CONSULTATION:Establishing goals of care  Palliative Care consult requested for goals of care in the case of this 85 y.o. female with multiple medical problems including COPD (home oxygen 2-3L), hypertension, diabetes, hyperlipidemia, irritable bowel syndrome, stage I non-small cell lung cancer s/p radiation treatments (May 2020), depression, and pneumonia. Patient presented to ED from home with complaints of shortness of breath and congested cough. She was found to be hypoxic in the 70s and 80s with notable dyspnea and orthopnea. Patient also complained about difficulty swallowing over the past month. CT chest showed worsening atelectasis or possible pneumonia and a large sliding-type hiatal hernia.   Clinical Assessment and Goals of Care: I have reviewed medical records including lab results, imaging, Epic notes, and MAR, received report from the bedside RN, and assessed the patient. I met at the bedside with patient to discuss diagnosis prognosis, GOC, EOL wishes, disposition and options. Patient is awake, alert, and oriented x3. She is sitting up in recliner eating lunch and talking on the phone to family. No complications noted as patient was eating. She denies pain, but endorses shortness of breath with increased exertion.  Offered to engage other family members of her choosing in our discussion however, patient declined and reports she can update her family.   I introduced Palliative Medicine as specialized medical care for people living with serious illness. It focuses on providing relief from the symptoms and stress of a serious illness. The goal is to improve quality of life for both the patient and the family.  We discussed a brief life review of the patient, along with functional  and nutritional status. 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 She has been married to her husband for 20 years. She has 2 step-daughters and one daughter from a previous marriage. She enjoys spending time with friends and family. She has 3 grandchildren which she shares stories of and expresses her love for them!   Prior to admission patient reports she was able to perform all ADLs independently She reports increase in fatigue and weakness at times, requiring her husband to provide some standby assistance when showering. She has a can and walker in the home for mobility assistance but states she does not use consistently. She reports at least 2-3 falls over the last month due to gait instability and loss of balance. She reports having to receive staples after one of her falls. She reports that her appetite is up and down. She has experienced difficulty with food feeling stuck when eating for years however she feels that it has worsened now since her radiation. She reports at least a 25lb weight loss over the past 6 months and more than a 60lb weight loss over the past 2 years. She states she was weighing 145lb and now weighs 86lbs. She wears 3L/[place] while at home and states when she goes out she is on 2L/[place]. States she is able to prepare meals as well as her husband or daughter often will. States her daughter is always overly concerned and worried about her, she is the one that insisted for weeks that she come into the hospital.   We discussed Her current illness and what it means in the larger context of Her on-going co-morbidities. With specific  discussions regarding her bilateral pneumonia, risk of aspiration, dysphagia, chronic respiratory failure, CHF, and lung cancer. Natural disease trajectory and expectations at EOL were discussed.   [name] verbalized understanding of her current illness and co-morbidities. She states she is hopeful she will recover from this pneumonia and  regain some strength by working with physical therapy. She reports she was going to receive home PT prior to admission, however she cancelled due to hospitalization. She request this to be set-back up once she is discharged. Patient reports "I am not ready to give up, I just am not. I have 3 grandchildren and a daughter to live for!"   She reports she knows she will not live forever and she is unsure how long but she knows she is not prepared to just lay down and die or discontinue any treatments.  She reports she knows she is sick, and she will not wait until she is this sick again to come to the hospital. She was concerned about COVID but knew it was time after she began feeling extremely horrible the day of coming in. She discusses her multiple hospitalizations and recovery during those stays referencing more so her recurrent pneumonia. I discuss with her the severity of reoccurring pneumonia, falls, as well as repetitive hospitalizations. Patient verbalized understanding and states "I am trying to be more careful and pay more attention to my body and lifestyle."   I attempted to elicit values and goals of care important to the patient.    The difference between aggressive medical intervention and comfort care was considered in light of the patient's goals of care. Patient states she has no doubt in the future she will need more of a comfort care and hospice approach but she is unwilling to settle for that at this time. She states "my health is between me and God! That is why I pick and choose what I let my family know because they don't need to be worried. It will work it self out one way or another. However God has it planned!" I attempted to discuss the importance of making sure family know full details in the event of a sudden health decline or emergency and they not be aware of the severity of her illness. She verbalized understanding and continued to express, she didn't wanting them worrying and  bugging her about what she can and can not do!   Advanced directives, concepts specific to code status, artifical feeding and hydration, and rehospitalization were considered and discussed. I discussed in details her full code status with consideration to her current illness and co-morbidities. Patient states she does have a documented advanced directive. Her husband, Jenny Reichmann and daughter, Vira Browns are her medical decision makers. She states her wishes are to remain a full code despite education on what a code would look like for her and her family. She expressed "I have to live, I want to live, and I don't care if it means CPR and life support. My family can make the decision when I can't based on the medical information they have and what they feel is best." We discussed in the event she required intubation the likelihood is high that she would not be able to successfully wean or return to her current baseline. I discussed tracheostomy and PEG for artificial feeding. I also discussed artificial feedings in the event her dysphagia continued to worsen and she became unable to take in food or drinks by mouth. She states she would not want a  tracheostomy or feeding tube. She states "my family is aware of that part and that would be part of the decisions they would not have to make for me."   Hospice and Palliative Care services outpatient were explained and offered. Patient verbalized her understanding and awareness of both palliative and hospice's goals and philosophy of care. She is in agreement with palliative but is not prepared to transition her care to a more hospice approach.   Questions and concerns were addressed.  Hard Choices booklet left for review. Patient was encouraged to call with questions or concerns.  PMT will continue to support holistically.   SOCIAL HISTORY:     reports that she quit smoking about 46 years ago. Her smoking use included cigarettes. She has a 30.00 pack-year smoking history.  She has never used smokeless tobacco. She reports that she does not drink alcohol or use drugs.  CODE STATUS: Full code  ADVANCE DIRECTIVES:NEXT OF KIN    SYMPTOM MANAGEMENT: Per attending   Palliative Prophylaxis:   Aspiration, Bowel Regimen, Delirium Protocol, Frequent Pain Assessment and Oral Care  PSYCHO-SOCIAL/SPIRITUAL:  Support System: Family   Desire for further Chaplaincy support:NO   Additional Recommendations (Limitations, Scope, Preferences):  Full Scope Treatment, No Artificial Feeding and No Tracheostomy   PAST MEDICAL HISTORY: Past Medical History:  Diagnosis Date   Anxiety    Aortic stenosis    Arthritis    Asthma    uses inhaler as needed   Depression    Diabetes mellitus    Emphysema of lung (HCC)    GERD (gastroesophageal reflux disease)    Glaucoma    Hyperlipidemia    Hypertension    [name]    Murmur    Nephrolithiasis    Pneumonia 3/17 and 3/18    PAST SURGICAL HISTORY:  Past Surgical History:  Procedure Laterality Date   ABDOMINAL HYSTERECTOMY     ANKLE SURGERY  1998   d/t fx.  Left ankle   ANTERIOR AND POSTERIOR REPAIR  06/21/2011   Procedure: ANTERIOR (CYSTOCELE) AND POSTERIOR REPAIR (RECTOCELE);  Surgeon: Maisie Fus;  Location: [facility];  Service: Gynecology;  Laterality: N/A;   AORTIC VALVE REPLACEMENT N/A 07/17/2013   Procedure: AORTIC VALVE REPLACEMENT (AVR);  Surgeon: Ivin Poot, MD;  Location: [facility];  Service: Open Heart Surgery;  Laterality: N/A;   CARDIAC CATHETERIZATION     CARDIAC VALVE REPLACEMENT     CORONARY ANGIOPLASTY     CORONARY ARTERY BYPASS GRAFT N/A 07/17/2013   Procedure: CORONARY ARTERY BYPASS GRAFT, TIMES ONE, ON PUMP, USING RIGHT INTERNAL MAMMARY ARTERY.;  Surgeon: Ivin Poot, MD;  Location: [facility];  Service: Open Heart Surgery;  Laterality: N/A;   FACIAL COSMETIC SURGERY     face lift   INTRAOPERATIVE TRANSESOPHAGEAL ECHOCARDIOGRAM N/A 07/17/2013    Procedure: INTRAOPERATIVE TRANSESOPHAGEAL ECHOCARDIOGRAM;  Surgeon: Ivin Poot, MD;  Location: [facility];  Service: Open Heart Surgery;  Laterality: N/A;   LAPAROSCOPIC ASSISTED VAGINAL HYSTERECTOMY  06/21/2011   Procedure: LAPAROSCOPIC ASSISTED VAGINAL HYSTERECTOMY;  Surgeon: Maisie Fus;  Location: [facility];  Service: Gynecology;  Laterality: N/A;   LEFT AND RIGHT HEART CATHETERIZATION WITH CORONARY ANGIOGRAM N/A 06/27/2013   Procedure: LEFT AND RIGHT HEART CATHETERIZATION WITH CORONARY ANGIOGRAM;  Surgeon: Ramond Dial, MD;  Location: [facility];  Service: Cardiovascular;  Laterality: N/A;   NOSE SURGERY     SALPINGOOPHORECTOMY  06/21/2011   Procedure: SALPINGO OOPHERECTOMY;  Surgeon: Maisie Fus;  Location: [facility];  Service: Gynecology;  Laterality: Bilateral;   VAGINAL PROLAPSE REPAIR  06/21/2011   Procedure: SACROPEXY;  Surgeon: Maisie Fus;  Location: [facility];  Service: Gynecology;  Laterality: N/A;  sacrospinous ligament suspension    ALLERGIES:  is allergic to lipitor [atorvastatin]; lisinopril; amaryl [glimepiride]; anoro ellipta [umeclidinium-vilanterol]; avelox [moxifloxacin hcl in nacl]; ciprofloxacin; other; prevacid [lansoprazole]; spiriva handihaler [tiotropium bromide monohydrate]; and symbicort [budesonide-formoterol fumarate].   MEDICATIONS:  Current Facility-Administered Medications  Medication Dose Route Frequency Provider Last Rate Last Dose   acetaminophen (TYLENOL) tablet 650 mg  650 mg Oral Q6H PRN Lady Deutscher, MD   650 mg at 03/23/19 1310   Or   acetaminophen (TYLENOL) suppository 650 mg  650 mg Rectal Q6H PRN Lady Deutscher, MD       albuterol (PROVENTIL) (2.5 MG/3ML) 0.083% nebulizer solution 2.5 mg  2.5 mg Nebulization Q6H PRN Lady Deutscher, MD   2.5 mg at 03/22/19 1737   amLODipine (NORVASC) tablet 10 mg  10 mg Oral Daily Lady Deutscher, MD   10 mg at 03/23/19 1015   aspirin EC tablet 81 mg  81 mg Oral QHS Lady Deutscher,  MD   81 mg at 03/22/19 2159   azithromycin (ZITHROMAX) 500 mg in sodium chloride 0.9 % 250 mL IVPB  500 mg Intravenous Q24H Lady Deutscher, MD 250 mL/hr at 03/23/19 1318 500 mg at 03/23/19 1318   budesonide (PULMICORT) nebulizer solution 1 mg  1 mg Nebulization BID Lady Deutscher, MD   1 mg at 03/23/19 6045   buPROPion (WELLBUTRIN XL) 24 hr tablet 150 mg  150 mg Oral Daily Lady Deutscher, MD   150 mg at 03/23/19 1016   cefTRIAXone (ROCEPHIN) 1 g in sodium chloride 0.9 % 100 mL IVPB  1 g Intravenous Q24H Lady Deutscher, MD 200 mL/hr at 03/22/19 1230 1 g at 03/22/19 1230   cholecalciferol (VITAMIN D) tablet 1,000 Units  1,000 Units Oral QHS Lady Deutscher, MD   1,000 Units at 03/22/19 2159   docusate sodium (COLACE) capsule 100 mg  100 mg Oral BID Lady Deutscher, MD   100 mg at 03/23/19 1018   enoxaparin (LOVENOX) injection 40 mg  40 mg Subcutaneous Q24H Lady Deutscher, MD   40 mg at 03/22/19 1719   feeding supplement (GLUCERNA SHAKE) (GLUCERNA SHAKE) liquid 237 mL  237 mL Oral TID BM Lady Deutscher, MD   237 mL at 03/23/19 1019   ferrous sulfate tablet 325 mg  325 mg Oral BID WC Lady Deutscher, MD   325 mg at 03/23/19 1013   hydrALAZINE (APRESOLINE) injection 20 mg  20 mg Intravenous Q6H PRN Lady Deutscher, MD   20 mg at 03/22/19 2156   hydrochlorothiazide (HYDRODIURIL) tablet 12.5 mg  12.5 mg Oral q morning - 10a Lady Deutscher, MD   12.5 mg at 03/23/19 1015   insulin aspart (novoLOG) injection 0-5 Units  0-5 Units Subcutaneous QHS Lady Deutscher, MD       insulin aspart (novoLOG) injection 0-9 Units  0-9 Units Subcutaneous TID WC Lady Deutscher, MD   2 Units at 03/23/19 4098   irbesartan (AVAPRO) tablet 300 mg  300 mg Oral Daily Lady Deutscher, MD   300 mg at 03/23/19 1013   magnesium oxide (MAG-OX) tablet 200 mg  200 mg Oral QHS Lady Deutscher, MD   200 mg at 03/22/19 2158   methylPREDNISolone sodium succinate (  SOLU-MEDROL)  125 mg/2 mL injection 125 mg  125 mg Intravenous Once Lady Deutscher, MD       methylPREDNISolone sodium succinate (SOLU-MEDROL) 40 mg/mL injection 40 mg  40 mg Intravenous Q12H Alekh, Kshitiz, MD       multivitamin (PROSIGHT) tablet 1 tablet  1 tablet Oral Daily Lady Deutscher, MD   1 tablet at 03/23/19 1016   ondansetron (ZOFRAN) tablet 4 mg  4 mg Oral Q6H PRN Lady Deutscher, MD       Or   ondansetron [facility]) injection 4 mg  4 mg Intravenous Q6H PRN Lady Deutscher, MD       oxyCODONE (Oxy IR/ROXICODONE) immediate release tablet 5 mg  5 mg Oral Q4H PRN Lady Deutscher, MD   5 mg at 03/23/19 1310   polyethylene glycol (MIRALAX / GLYCOLAX) packet 17 g  17 g Oral Daily PRN Lady Deutscher, MD       pravastatin (PRAVACHOL) tablet 40 mg  40 mg Oral QPM Lady Deutscher, MD   40 mg at 03/22/19 1719   Resource ThickenUp Clear   Oral PRN Desiree August, MD       sertraline (ZOLOFT) tablet 100 mg  100 mg Oral q morning - 10a Lady Deutscher, MD   100 mg at 03/23/19 1018   sodium chloride flush (NS) 0.9 % injection 3 mL  3 mL Intravenous Q12H Lady Deutscher, MD   3 mL at 03/23/19 1311   umeclidinium-vilanterol (ANORO ELLIPTA) 62.5-25 MCG/INH 1 puff  1 puff Inhalation Daily Lady Deutscher, MD   1 puff at 03/23/19 0853    VITAL SIGNS: BP (!) 152/69    Pulse 81    Temp 98.5 F (36.9 C)    Resp 18    SpO2 93%  There were no vitals filed for this visit.  Estimated body mass index is 17.09 kg/m as calculated from the following:   Height as of 02/06/19: 5' (1.524 m).   Weight as of 02/06/19: 39.7 kg.  LABS: CBC:    Component Value Date/Time   WBC 11.7 (H) 03/23/2019 0433   HGB 10.1 (L) 03/23/2019 0433   HCT 32.4 (L) 03/23/2019 0433   PLT 327 03/23/2019 0433   Comprehensive Metabolic Panel:    Component Value Date/Time   NA 139 03/23/2019 0433   NA 141 01/30/2019 0833   K 4.8 03/23/2019 0433   CO2 21 (L) 03/23/2019 0433   BUN 28 (H) 03/23/2019 0433     BUN 26 01/30/2019 0833   CREATININE 1.01 (H) 03/23/2019 0433   CREATININE 1.18 (H) 04/30/2014 0843   ALBUMIN 2.7 (L) 03/22/2019 0351   ALBUMIN 4.4 01/30/2019 0833     Review of Systems  Neurological: Positive for weakness.  All other systems reviewed and are negative.    Physical Exam General: NAD, frail chronically-ill appearing, thin Cardiovascular: regular rate and rhythm Pulmonary: diminished bases, rhonchi, congested cough  Abdomen: soft, nontender, + bowel sounds Extremities: no edema, no joint deformities Skin: no rashes, scattered bruising  Neurological: Weakness, awake, A&O x3   Prognosis: Guarded to Poor in the setting of dysphagic, hypoxic respiratory failure, hypertension, CKD stage III, diabetes, stage 1 NSCLC, dCHF, recurrent pneumonia, COPD oxygen dependent, generalized weakness and deconditioning.   Discharge Planning:  Patient states her wishes are to go home with home health and outpatient palliative   Recommendations:  Full Code-at patient's request  Continue wit current plan of care   Patient remains  hopeful for some signs of improvement or stability, continues to state she "has to much to live for to just give up now". Full aggressive medical interventions  Agreeable to outpatient palliative at minimum. This will be ideal for continued goals of care discussions. (referral placed)  PMT will continue to follow and support as needed.     Palliative Performance Scale: PPS 40%              Patient expressed understanding and was in agreement with this plan.   Thank you for allowing the Palliative Medicine Team to assist in the care of this patient.  Time In: 1345 Time Out: 1500 Time Total: 75 min.  Visit consisted of counseling and education dealing with the complex and emotionally intense issues of symptom management and palliative care in the setting of serious and potentially life-threatening illness.Greater than 50%  of this time was spent  counseling and coordinating care related to the above assessment and plan.  Signed by:  Alda Lea, AGPCNP-BC Palliative Medicine Team  Phone: [phone] Fax: [phone] Pager: [phone] Amion: Bjorn Pippin

## 2019-03-23 NOTE — Progress Notes (Signed)
Speech Language Pathology Treatment: Dysphagia  Patient Details Name: [name] MRN: [id number] DOB: [birth year] Today's Date: 03/23/2019 Time: 3662-9476 SLP Time Calculation (min) (ACUTE ONLY): 21 min  Assessment / Plan / Recommendation Clinical Impression  Spoke with daughter Vira Browns [phone]) at request of patient to update her on dysphagia management.  Discussed results of MBSS, diet recommendations, compensatory strategies, and swallow exercise recommendations as well as recommendations for continued [place] after discharge.  Daughter agreeable to treatment plan.  Daughter expresses concern about pt safety at discharge.  She does not believe that pt's husband can adequately provide care and believes there may be some hoarding tendencies that make the environment less safe.  Based on daughter's concerns with follow through on medical care/recommendations, and concerns about the physical safety of the home environment, a social work consult may be beneficial to determine safest discharge plan.  Pt may benefit from additional home services to improve safety of home environment. Spoke with RN to relay content of conversation with family, and she will alert social work for potential consult.  Daughter will relay her concerns to MD as well, as she expects to have conversation at some point this afternoon.   HPI HPI: [name] is a 85 y.o. female with medical history significant of stage I non-small cell lung cancer treated with radiation; last treatments in May 2020, COPD followed by pulmonary on chronic oxygen 2 L at home, hypertension, diabetes type 2, aortic stenosis status post valve replacement, hyperlipidemia, and irritable bowel syndrome who presents emergency department in [place] with O2 sats in the 70s and 80s with respiratory distress. Complains of a wet cough but is unable to produce sputum. She denies any fevers or chills. She has had no anginal type chest pain. She notes  that her dyspnea on exertion is present as well as having some orthopnea. She was prescribed prednisone but has not had any relief with it she has been taking it for 2 days. Current symptoms feel like her prior COPD exacerbations. States that she has had significant weight loss and has have difficulty because when she swallows things feel like they are stuck. Her husband says that her weight loss began long time prior to her receiving radiation treatments. Patient states that her swallowing problems have been present for the last month which would correlate with her radiation treatments. She did not receive any chemotherapy for her lung cancer. Also on presentation her blood pressure is markedly elevated at 199/87 but patient states it runs that way all the time. (?). Patient also states that since starting the prednisone her sugars have been markedly elevated. Of note all of her recent white blood cell counts are above normal and I suspect the patient may have some mild amount of possibly CLL or CML. Chest xray was showing no active disease, severe COPD.      SLP Plan  Continue with current plan of care       Recommendations  Diet recommendations: Dysphagia 3 (mechanical soft);Thin liquid Liquids provided via: Teaspoon Medication Administration: Crushed with puree Supervision: Intermittent supervision to cue for compensatory strategies Compensations: Slow rate;Small sips/bites;Effortful swallow(follow sips of thin liquid with solid/puree) Postural Changes and/or Swallow Maneuvers: Seated upright 90 degrees;Upright 30-60 min after meal                Oral Care Recommendations: Oral care BID Follow up Recommendations: Home health SLP SLP Visit Diagnosis: Dysphagia, oropharyngeal phase (R13.12) Plan: Continue with current plan of care  [place], [place] Office: [phone]; Pager [phone]): [phone] 03/23/2019, 2:35  PM

## 2019-03-23 NOTE — Progress Notes (Signed)
Patient ID: [name], female   DOB: Jan 25, 1932, 85 y.o.   MRN: [id number]  PROGRESS NOTE    CHESNEY SUARES  TDS:[id number] DOB: [birth year] DOA: 03/21/2019 PCP: Harlan Stains, MD   Brief Narrative:  85 year old female with history of stage I non-small cell lung cancer status post radiation with last treatment in May 2020, COPD, chronic hypoxic respiratory failure on home oxygen 2 to 3 L/min, hypertension, diabetes mellitus type 2, aortic stenosis status post replacement, hyperlipidemia, irritable bowel syndrome presented with worsening shortness of breath and hypoxia on 03/21/2019.CT angiogram of the chest was negative for PE but showed increasing bilateral lower lobe opacities.  She was started on antibiotics.  Assessment & Plan:   Principal Problem:   Sepsis due to pneumonia [facility]) Active Problems:   COPD GOLD II    S/P AVR (aortic valve replacement)   COPD exacerbation (HCC)   Acute on chronic respiratory failure with hypoxia (HCC)   CKD (chronic kidney disease), stage III (HCC)   Diabetes mellitus type 2, uncontrolled (HCC)   Recurrent pneumonia   Chronic diastolic CHF (congestive heart failure) (HCC)   Pulmonary HTN (HCC)   HLD (hyperlipidemia)   Hypertensive urgency   Non-small cell lung cancer ([place])   Leukocytosis  Sepsis: Present on admission, probably from pneumonia -Follow cultures.  Antibiotic plan as below.  Hemodynamically improving. -Discontinue IV fluids  Bilateral community-acquired lower lobe pneumonia in a patient with recurrent pneumonia -She might have a component of aspiration pneumonia as well considering recent worsening dysphagia -Continue Rocephin and Zithromax. -Follow cultures.  Initial COVID-19 testing negative.  Procalcitonin 0.66 this morning. - urine streptococcal antigen negative.  Urine Legionella antigen pending. -Continue oxygen supplementation  Acute on chronic hypoxic respiratory failure Probable COPD exacerbation -Patient normally uses  2 to 3 L oxygen via nasal cannula at home -Currently still on on 4 L oxygen.  Wean as able.  Incentive spirometry -Continue neb treatments. decrease Solu-Medrol to 40 mg IV every 12 hours  Worsening dysphagia -Probably secondary to radiation changes to esophagus. -Diet as per SLP recommendations.  Aspiration precautions.  Hypertensive urgency -Pressures still on the high side but improving.  Continue amlodipine, hydrochlorothiazide and irbesartan.  Monitor blood pressure  Chronic kidney disease stage III -Creatinine stable.  Monitor  Chronic diastolic heart failure Pulmonary hypertension -Currently compensated.  Continue supportive care.  Strict input and output and daily weights.  Outpatient follow-up with cardiology.  Echo on 01/01/2018 showed EF of 55 to 60% with grade 1 diastolic dysfunction and pulmonary artery peak pressure of 48  Leukocytosis -Improving.  Monitor.  Diabetes mellitus type 2 with hyperglycemia -Hemoglobin A1c 6.7.  Continue Accu-Cheks with sliding scale coverage  Hyperlipidemia -Continue statin  Stage I nonsmall cell lung cancer -Treated with radiation, last treatment in May 2020 -Outpatient follow-up with oncology  Generalized deconditioning Recent weight loss -PT recommends home health PT. -Overall prognosis is guarded to poor.  Patient has had progressive decline in her general health with decreasing appetite and progressive weight loss.  Palliative care evaluation pending.  DVT prophylaxis: Lovenox Code Status: Full Family Communication: Spoke to the patient at bedside.  No family member at bedside.   Disposition Plan: Home in 1 to 2 days once clinically improves  Consultants: None  Procedures: None  Antimicrobials: Rocephin and Zithromax from 03/21/2019 onwards   Subjective: Patient seen and examined at bedside.  No overnight fever, vomiting or worsening shortness of breath reported.  Still feels weak and has intermittent cough.  Objective:  Vitals:   03/22/19 1639 03/22/19 1740 03/22/19 2117 03/22/19 2136  BP: (!) 161/79   (!) 195/85  Pulse: 81  74 81  Resp: 16  16 18   Temp: 97.8 F (36.6 C)   98.5 F (36.9 C)  TempSrc: Oral     SpO2: 96% 95% 96% 94%    Intake/Output Summary (Last 24 hours) at 03/23/2019 0826 Last data filed at 03/22/2019 1705 Gross per 24 hour  Intake 882.2 ml  Output 620 ml  Net 262.2 ml   There were no vitals filed for this visit.  Examination:  General exam: No acute distress.  Looks very thinly built.  Elderly female. Respiratory system: Bilateral decreased breath sounds at bases with scattered crackles and mild wheezing Cardiovascular system: Rate controlled, S1-S2 heard Gastrointestinal system: Abdomen is nondistended, soft and nontender. Normal bowel sounds heard. Extremities: No cyanosis or edema  central nervous system: Alert and oriented. No focal neurological deficits. Moving extremities Skin: No rashes, lesions or ulcers Psychiatry: Looks slightly anxious.    Data Reviewed: I have personally reviewed following labs and imaging studies  CBC: Recent Labs  Lab 03/21/19 1052 03/22/19 0351 03/23/19 0433  WBC 33.3* 28.1* 11.7*  NEUTROABS 30.0*  --  10.2*  HGB 11.8* 10.1* 10.1*  HCT 38.5 33.1* 32.4*  MCV 95.3 95.7 93.9  PLT 375 305 378   Basic Metabolic Panel: Recent Labs  Lab 03/21/19 1052 03/21/19 1809 03/22/19 0351 03/23/19 0433  NA 139  --  140 139  K 4.2  --  4.5 4.8  CL 105  --  109 109  CO2 21*  --  25 21*  GLUCOSE 338*  --  140* 205*  BUN 26*  --  19 28*  CREATININE 1.09*  --  0.91 1.01*  CALCIUM 9.9  --  8.9 9.4  MG  --  2.1  --  2.2   GFR: CrCl cannot be calculated (Unknown ideal weight.). Liver Function Tests: Recent Labs  Lab 03/21/19 1052 03/22/19 0351  AST 40 19  ALT 48* 34  ALKPHOS 88 74  BILITOT 0.7 0.6  PROT 7.4 6.1*  ALBUMIN 3.2* 2.7*   No results for input(s): LIPASE, AMYLASE in the last 168 hours. No results for input(s): AMMONIA in  the last 168 hours. Coagulation Profile: Recent Labs  Lab 03/22/19 0351  INR 1.2   Cardiac Enzymes: No results for input(s): CKTOTAL, CKMB, CKMBINDEX, TROPONINI in the last 168 hours. BNP (last 3 results) No results for input(s): PROBNP in the last 8760 hours. HbA1C: Recent Labs    03/21/19 1809  HGBA1C 6.7*   CBG: Recent Labs  Lab 03/21/19 2240 03/22/19 0910 03/22/19 1634 03/22/19 2138 03/23/19 0758  GLUCAP 189* 127* 301* 138* 162*   Lipid Profile: No results for input(s): CHOL, HDL, LDLCALC, TRIG, CHOLHDL, LDLDIRECT in the last 72 hours. Thyroid Function Tests: Recent Labs    03/21/19 1809  TSH 1.768   Anemia Panel: No results for input(s): VITAMINB12, FOLATE, FERRITIN, TIBC, IRON, RETICCTPCT in the last 72 hours. Sepsis Labs: Recent Labs  Lab 03/21/19 1152 03/21/19 1602 03/21/19 1830 03/21/19 2105 03/22/19 0351  PROCALCITON  --   --   --   --  0.66  LATICACIDVEN 2.0* 1.4 1.4 1.3  --     Recent Results (from the past 240 hour(s))  SARS Coronavirus 2 (CEPHEID- Performed in [place] hospital lab), Hosp Order     Status: None   Collection Time: 03/21/19 11:02 AM   Specimen: Nasopharyngeal  Swab  Result Value Ref Range Status   SARS Coronavirus 2 NEGATIVE NEGATIVE Final    Comment: (NOTE) If result is NEGATIVE SARS-CoV-2 target nucleic acids are NOT DETECTED. The SARS-CoV-2 RNA is generally detectable in upper and lower  respiratory specimens during the acute phase of infection. The lowest  concentration of SARS-CoV-2 viral copies this assay can detect is 250  copies / mL. A negative result does not preclude SARS-CoV-2 infection  and should not be used as the sole basis for treatment or other  patient management decisions.  A negative result may occur with  improper specimen collection / handling, submission of specimen other  than nasopharyngeal swab, presence of viral mutation(s) within the  areas targeted by this assay, and inadequate number of  viral copies  (<250 copies / mL). A negative result must be combined with clinical  observations, patient history, and epidemiological information. If result is POSITIVE SARS-CoV-2 target nucleic acids are DETECTED. The SARS-CoV-2 RNA is generally detectable in upper and lower  respiratory specimens dur ing the acute phase of infection.  Positive  results are indicative of active infection with SARS-CoV-2.  Clinical  correlation with patient history and other diagnostic information is  necessary to determine patient infection status.  Positive results do  not rule out bacterial infection or co-infection with other viruses. If result is PRESUMPTIVE POSTIVE SARS-CoV-2 nucleic acids MAY BE PRESENT.   A presumptive positive result was obtained on the submitted specimen  and confirmed on repeat testing.  While 2019 novel coronavirus  (SARS-CoV-2) nucleic acids may be present in the submitted sample  additional confirmatory testing may be necessary for epidemiological  and / or clinical management purposes  to differentiate between  SARS-CoV-2 and other Sarbecovirus currently known to infect humans.  If clinically indicated additional testing with an alternate test  methodology [phone]) is advised. The SARS-CoV-2 RNA is generally  detectable in upper and lower respiratory sp ecimens during the acute  phase of infection. The expected result is Negative. Fact Sheet for Patients:  StrictlyIdeas.no Fact Sheet for Healthcare Providers: BankingDealers.co.za This test is not yet approved or cleared by the [place] FDA and has been authorized for detection and/or diagnosis of SARS-CoV-2 by FDA under an Emergency Use Authorization (EUA).  This EUA will remain in effect (meaning this test can be used) for the duration of the COVID-19 declaration under Section 564(b)(1) of the Act, 21 U.S.C. section 360bbb-3(b)(1), unless the authorization is  terminated or revoked sooner. Performed at [facility], [place] [address], [place], [place] [address]   Blood Culture (routine x 2)     Status: None (Preliminary result)   Collection Time: 03/21/19 11:52 AM   Specimen: BLOOD  Result Value Ref Range Status   Specimen Description BLOOD LEFT ANTECUBITAL  Final   Special Requests   Final    BOTTLES DRAWN AEROBIC ONLY Blood Culture adequate volume   Culture   Final    NO GROWTH 1 DAY Performed at [facility], [place] [address] [place], [place] [address]    Report Status PENDING  Incomplete  Blood Culture (routine x 2)     Status: None (Preliminary result)   Collection Time: 03/21/19 11:52 AM   Specimen: BLOOD LEFT ARM  Result Value Ref Range Status   Specimen Description BLOOD LEFT ARM  Final   Special Requests   Final    BOTTLES DRAWN AEROBIC AND ANAEROBIC Blood Culture adequate volume   Culture   Final  NO GROWTH 1 DAY Performed at [facility], [place] [address], [place], [place] [address]    Report Status PENDING  Incomplete         Radiology Studies: Ct Angio Chest Pe W/cm &/or Wo Cm  Result Date: 03/21/2019 CLINICAL DATA:  Shortness of breath. EXAM: CT ANGIOGRAPHY CHEST WITH CONTRAST TECHNIQUE: Multidetector CT imaging of the chest was performed using the standard protocol during bolus administration of intravenous contrast. Multiplanar CT image reconstructions and MIPs were obtained to evaluate the vascular anatomy. CONTRAST:  161mL OMNIPAQUE IOHEXOL 350 MG/ML SOLN COMPARISON:  Radiograph of same day.  CT scan of Feb 06, 2019. FINDINGS: Cardiovascular: Satisfactory opacification of the pulmonary arteries to the segmental level. No evidence of pulmonary embolism. Normal heart size. No pericardial effusion. Enlargement of the main pulmonary artery is noted consistent with pulmonary artery hypertension. Atherosclerosis of thoracic aorta is noted without aneurysm formation. Status post aortic valve repair.  Mediastinum/Nodes: Large sliding-type hiatal hernia is noted. No adenopathy is noted. Thyroid gland is unremarkable. Lungs/Pleura: Severe emphysematous disease is noted in both upper lobes. No pneumothorax is noted. Increased bilateral lower lobe airspace opacities are noted concerning for worsening atelectasis or possibly infiltrates. Upper Abdomen: No acute abnormality. Musculoskeletal: No chest wall abnormality. No acute or significant osseous findings. Review of the MIP images confirms the above findings. IMPRESSION: No definite evidence of pulmonary embolus. Increased bilateral lower lobe airspace opacities are noted concerning for worsening atelectasis or possibly pneumonia. Large sliding-type hiatal hernia. Enlargement of pulmonary artery is noted consistent with pulmonary artery hypertension. Status post aortic valve repair. Aortic Atherosclerosis (ICD10-I70.0) and Emphysema (ICD10-J43.9). Electronically Signed   By: Marijo Conception M.D.   On: 03/21/2019 15:30   Dg Chest Portable 1 View  Result Date: 03/21/2019 CLINICAL DATA:  Severe shortness of breath. EXAM: PORTABLE CHEST 1 VIEW COMPARISON:  CT chest dated Feb 06, 2019. Chest x-ray dated January 06, 2019. FINDINGS: The heart size and mediastinal contours are within normal limits. Prior aortic valve replacement. Atherosclerotic calcification of the aortic arch. Normal pulmonary vascularity. The lungs remain hyperinflated with severe bullous emphysematous changes. Bibasilar scarring. No focal consolidation, pleural effusion, or pneumothorax. No acute osseous abnormality. IMPRESSION: 1. No active disease. 2. Severe COPD. Electronically Signed   By: Titus Dubin M.D.   On: 03/21/2019 11:16        Scheduled Meds: . amLODipine  10 mg Oral Daily  . aspirin EC  81 mg Oral QHS  . budesonide (PULMICORT) nebulizer solution  1 mg Nebulization BID  . buPROPion  150 mg Oral Daily  . cholecalciferol  1,000 Units Oral QHS  . docusate sodium  100 mg Oral  BID  . enoxaparin (LOVENOX) injection  40 mg Subcutaneous Q24H  . feeding supplement (GLUCERNA SHAKE)  237 mL Oral TID BM  . ferrous sulfate  325 mg Oral BID WC  . hydrochlorothiazide  12.5 mg Oral q morning - 10a  . insulin aspart  0-5 Units Subcutaneous QHS  . insulin aspart  0-9 Units Subcutaneous TID WC  . irbesartan  300 mg Oral Daily  . magnesium oxide  200 mg Oral QHS  . methylPREDNISolone (SOLU-MEDROL) injection  125 mg Intravenous Once  . methylPREDNISolone (SOLU-MEDROL) injection  40 mg Intravenous Q8H  . multivitamin  1 tablet Oral Daily  . pravastatin  40 mg Oral QPM  . sertraline  100 mg Oral q morning - 10a  . sodium chloride flush  3 mL Intravenous Q12H  .  umeclidinium-vilanterol  1 puff Inhalation Daily   Continuous Infusions: . azithromycin 500 mg (03/22/19 1359)  . cefTRIAXone (ROCEPHIN)  IV 1 g (03/22/19 1230)     LOS: 2 days        Aline August, MD [facility] 03/23/2019, 8:26 AM

## 2019-03-23 NOTE — Progress Notes (Signed)
Speech Language Pathology Treatment: Dysphagia  Patient Details Name: [name] MRN: [id number] DOB: 1932/05/04 Today's Date: 03/23/2019 Time: 9242-6834 SLP Time Calculation (min) (ACUTE ONLY): 47 min  Assessment / Plan / Recommendation Clinical Impression  Extensive swallow therapy today including introduction to swallowing exercises, discussion of goals of care, treatment plan, and pt preferences. Pt is eager to upgrade from honey thick liquids.  Discussed results of yesterday's MBSS with patient.  There is a risk of aspiration with any liquids.  Although initially agreeable to honey thick liquids for duration of treatment, pt later stated she would rather take thin liquid by teaspoon than continue with thickened liquids after trials of thin liquid for use with exercises.  Honey thick liquid does not penetrate as deeply, but is more difficult to clear if aspirated and is not as easily absorbed by lungs.  With thin liquid by teaspoon, there was throat clear after 3-4 sips, which cleared vocal quality.  On MBSS, pt was intermittently sensate, with cued throat clear being partially effective at clearing penetration. With trials of thin liquid by spoon, followed by heavier puree bolus, clinical s/s of aspiration/penetration were eliminated today.  Per discussion with pt and with RN, diet altered to mechanical soft with thin liquid by teaspoon with strict aspiration precautions.  Discussed role of SLP in treatment of dysphagia.  Pt agreeable to therapy targeting strength and ROM of pharyngeal musculature.  Pt believes that radiation may be impacting her swallow function; confirmed that this is possible.  Pt is only about 1 year post radiation and is able to execute all swallow exercises giving her a good prognosis for improvement of swallow function.  Pt completed exercises as noted below with good effort and accuracy.  Pt has arranged for PT at home and is looking forward to initiating therapy.  Pt is  very receptive to adding [facility] SLP services as well.  Recommend mechanical soft solids with thin liquid by teaspoon.  Use effortful swallow.  Follow every 2-3 sips of liquid by spoon with puree/solid.   HPI HPI: [name] is a 85 y.o. female with medical history significant of stage I non-small cell lung cancer treated with radiation; last treatments in May 2020, COPD followed by pulmonary on chronic oxygen 2 L at home, hypertension, diabetes type 2, aortic stenosis status post valve replacement, hyperlipidemia, and irritable bowel syndrome who presents emergency department in [place] with O2 sats in the 70s and 80s with respiratory distress. Complains of a wet cough but is unable to produce sputum. She denies any fevers or chills. She has had no anginal type chest pain. She notes that her dyspnea on exertion is present as well as having some orthopnea. She was prescribed prednisone but has not had any relief with it she has been taking it for 2 days. Current symptoms feel like her prior COPD exacerbations. States that she has had significant weight loss and has have difficulty because when she swallows things feel like they are stuck. Her husband says that her weight loss began long time prior to her receiving radiation treatments. Patient states that her swallowing problems have been present for the last month which would correlate with her radiation treatments. She did not receive any chemotherapy for her lung cancer. Also on presentation her blood pressure is markedly elevated at 199/87 but patient states it runs that way all the time. (?). Patient also states that since starting the prednisone her sugars have been markedly elevated. Of note all of  her recent white blood cell counts are above normal and I suspect the patient may have some mild amount of possibly CLL or CML. Chest xray was showing no active disease, severe COPD.      SLP Plan  Continue with current plan of care        Recommendations  Diet recommendations: Dysphagia 3 (mechanical soft);Thin liquid Liquids provided via: Teaspoon Medication Administration: Crushed with puree Supervision: Intermittent supervision to cue for compensatory strategies Compensations: Slow rate;Small sips/bites;Effortful swallow(follow sips of thin liquid with solid/puree) Postural Changes and/or Swallow Maneuvers: Seated upright 90 degrees;Upright 30-60 min after meal                Oral Care Recommendations: Oral care BID Follow up Recommendations: Home health SLP SLP Visit Diagnosis: Dysphagia, oropharyngeal phase (R13.12) Plan: Continue with current plan of care       [place], [place], [place] Office: [phone]; Pager [phone]): [phone] 03/23/2019, 1:47 PM

## 2019-03-24 LAB — CBC WITH DIFFERENTIAL/PLATELET
Abs Immature Granulocytes: 0.1 10*3/uL — ABNORMAL HIGH (ref 0.00–0.07)
Basophils Absolute: 0 10*3/uL (ref 0.0–0.1)
Basophils Relative: 0 %
Eosinophils Absolute: 0 10*3/uL (ref 0.0–0.5)
Eosinophils Relative: 0 %
HCT: 35.7 % — ABNORMAL LOW (ref 36.0–46.0)
Hemoglobin: 11.2 g/dL — ABNORMAL LOW (ref 12.0–15.0)
Immature Granulocytes: 1 %
Lymphocytes Relative: 8 %
Lymphs Abs: 0.9 10*3/uL (ref 0.7–4.0)
MCH: 29.2 pg (ref 26.0–34.0)
MCHC: 31.4 g/dL (ref 30.0–36.0)
MCV: 93.2 fL (ref 80.0–100.0)
Monocytes Absolute: 0.5 10*3/uL (ref 0.1–1.0)
Monocytes Relative: 4 %
Neutro Abs: 9.9 10*3/uL — ABNORMAL HIGH (ref 1.7–7.7)
Neutrophils Relative %: 87 %
Platelets: 380 10*3/uL (ref 150–400)
RBC: 3.83 MIL/uL — ABNORMAL LOW (ref 3.87–5.11)
RDW: 13.3 % (ref 11.5–15.5)
WBC: 11.5 10*3/uL — ABNORMAL HIGH (ref 4.0–10.5)
nRBC: 0 % (ref 0.0–0.2)

## 2019-03-24 LAB — GLUCOSE, CAPILLARY
Glucose-Capillary: 121 mg/dL — ABNORMAL HIGH (ref 70–99)
Glucose-Capillary: 138 mg/dL — ABNORMAL HIGH (ref 70–99)
Glucose-Capillary: 262 mg/dL — ABNORMAL HIGH (ref 70–99)

## 2019-03-24 LAB — BASIC METABOLIC PANEL
Anion gap: 10 (ref 5–15)
BUN: 34 mg/dL — ABNORMAL HIGH (ref 8–23)
CO2: 23 mmol/L (ref 22–32)
Calcium: 9.9 mg/dL (ref 8.9–10.3)
Chloride: 109 mmol/L (ref 98–111)
Creatinine, Ser: 1.04 mg/dL — ABNORMAL HIGH (ref 0.44–1.00)
GFR calc Af Amer: 57 mL/min — ABNORMAL LOW (ref >60)
GFR calc non Af Amer: 49 mL/min — ABNORMAL LOW (ref >60)
Glucose, Bld: 157 mg/dL — ABNORMAL HIGH (ref 70–99)
Potassium: 4.4 mmol/L (ref 3.5–5.1)
Sodium: 142 mmol/L (ref 135–145)

## 2019-03-24 LAB — MAGNESIUM: Magnesium: 2 mg/dL (ref 1.7–2.4)

## 2019-03-24 MED ORDER — AMOXICILLIN-POT CLAVULANATE 875-125 MG PO TABS: 1.0000 | ORAL_TABLET | Freq: Two times a day (BID) | ORAL | 0 refills | Status: DC

## 2019-03-24 MED ORDER — POLYETHYLENE GLYCOL 3350 17 G PO PACK: 17.0000 g | PACK | Freq: Every day | ORAL | 0 refills | Status: AC | PRN

## 2019-03-24 MED ORDER — AMOXICILLIN-POT CLAVULANATE 500-125 MG PO TABS: 1.0000 | ORAL_TABLET | Freq: Two times a day (BID) | ORAL | 0 refills | Status: AC

## 2019-03-24 MED ORDER — TRAMADOL HCL 50 MG PO TABS: 50.0000 mg | ORAL_TABLET | Freq: Four times a day (QID) | ORAL | 0 refills | Status: AC | PRN

## 2019-03-24 NOTE — Progress Notes (Signed)
Pt given discharge instructions, prescriptions, and care notes. Pt verbalized understanding AEB no further questions or concerns at this time. IV was discontinued, no redness, pain, or swelling noted at this time. Pt left the floor via wheelchair with staff in stable condition. 

## 2019-03-24 NOTE — TOC Transition Note (Addendum)
Transition of Care [facility]) - CM/SW Discharge Note   Patient Details  Name: [name] MRN: [id number] Date of Birth: 07-22-1932  [facility]) CM/SW Contact:  Sharin Mons, RN Phone Number: 03/24/2019, 3:32 PM   Clinical Narrative:   Admitted with SOB/HYPOXIA.Hx of COPD, depression, HTN, DM, CAD/CABG, stage I non-small cell lung cancer s/p radiation with last treatment in May 2020, aortic stenosis s/p replacement, HLD. Transition to home with husband. Home health services will be provided per [facility] Pt states home 02 dependent. [facility].  States has transportation to home. Physical home address: [place], [place], [place] [address].  Corinna Lines (Spouse) Delayne Schoolfield (Daughter)    [phone]     PCP: Harlan Stains  Final next level of care: Home w Home Health Services Barriers to Discharge: No Barriers Identified   Patient Goals and CMS Choice Patient states their goals for this hospitalization and ongoing recovery are:: Get better CMS Medicare.gov Compare Post Acute Care list provided to:: Patient Choice offered to / list presented to : Patient  Discharge Placement                       Discharge Plan and Services                DME Arranged: N/A DME Agency: NA       HH Arranged: PT, Speech Therapy HH Agency: [place] Date [place]: 03/24/19 Time [place]: 1532 Representative spoke with at [place]: [place] ([place]) Interventions     Readmission Risk Interventions No flowsheet data found.

## 2019-03-24 NOTE — Discharge Summary (Addendum)
Physician Discharge Summary  [name] KGY:[id number] DOB: 1932-05-16 DOA: 03/21/2019  PCP: Harlan Stains, MD  Admit date: 03/21/2019 Discharge date: 03/24/2019  Admitted From: Home Disposition: Home  Recommendations for Outpatient Follow-up:  1. Follow up with PCP in 1 week with repeat CBC/BMP 2. Patient will benefit from outpatient palliative care evaluation and follow-up 3. Follow up in ED if symptoms worsen or new appear 4. Consider outpatient GI evaluation if persistently has dysphagia.   Home Health: Home with PT/SLP Equipment/Devices: Patient is on oxygen at home via nasal cannula 2 to 3 L/min chronically.  Discharge Condition: Guarded CODE STATUS: Full Diet recommendation: Heart healthy/carb modified/diet as per SLP recommendations  Brief/Interim Summary: 85 year old female with history of stage I non-small cell lung cancer status post radiation with last treatment in May 2020, COPD, chronic hypoxic respiratory failure on home oxygen 2 to 3 L/min, hypertension, diabetes mellitus type 2, aortic stenosis status post replacement, hyperlipidemia, irritable bowel syndrome presented with worsening shortness of breath and hypoxia on 03/21/2019.CT angiogram of the chest was negative for PE but showed increasing bilateral lower lobe opacities.  She was started on antibiotics.  During the hospitalization, palliative care evaluation was requested for goals of care discussion.  Patient remains full code.  Her respiratory status is improved.  Patient feels better.  Cultures have been negative so far.  She was also treated with intravenous Solu-Medrol.  She will be discharged home on oral antibiotics and to finish oral prednisone course which was started prior to presentation.  Diet as per SLP recommendations.  Discharge Diagnoses:   Sepsis: Present on admission, probably from pneumonia -Antibiotic plan as below.  Hemodynamically stable -Treated with IV fluids initially and subsequently  discontinued -Sepsis has resolved  -Cultures negative so far   bilateral community-acquired lower lobe pneumonia in a patient with recurrent pneumonia -She might have a component of aspiration pneumonia as well considering recent worsening dysphagia -Currently on Rocephin and Zithromax. -Initial COVID-19 testing negative.   - urine streptococcal antigen negative.   -Respiratory status has improved.  We will switch to oral Augmentin for 4 more days and discharge the patient home  Acute on chronic hypoxic respiratory failure Probable COPD exacerbation -Patient normally uses 2 to 3 L oxygen via nasal cannula at home -Currently on 3 L oxygen.  -Treated with intravenous Solu-Medrol.  Patient feels better.  Continue home regimen.  Patient will need to finish the prednisone course which was started prior to presentation by PCP.  Outpatient follow-up with pulmonary if needed.  Worsening dysphagia -Probably secondary to radiation changes to esophagus. -Diet as per SLP recommendations.  Aspiration precautions. -Might benefit from outpatient GI evaluation if persistently has dysphagia.  Hypertensive urgency -Pressures still on the high side but improving.  Continue amlodipine, hydrochlorothiazide and irbesartan.    Outpatient follow-up.  Chronic kidney disease stage III -Creatinine stable.    Chronic diastolic heart failure Pulmonary hypertension -Currently compensated.  Continue supportive care.  Strict input and output and daily weights.  Outpatient follow-up with cardiology.  Echo on 01/01/2018 showed EF of 55 to 60% with grade 1 diastolic dysfunction and pulmonary artery peak pressure of 48  Leukocytosis -Improving.    Diabetes mellitus type 2 with hyperglycemia -Hemoglobin A1c 6.7.  Continue carb modified diet and home regimen.  Hyperlipidemia -Continue statin  Stage I nonsmall cell lung cancer -Treated with radiation, last treatment in May 2020 -Outpatient follow-up with  oncology  Generalized deconditioning Recent weight loss -PT recommends home health PT. -Overall  prognosis is guarded to poor.  Patient has had progressive decline in her general health with decreasing appetite and progressive weight loss.  Palliative care evaluation appreciated.  Patient remains full code.  Patient will benefit from continued outpatient palliative care evaluation and discussion.  Discharge Instructions  Discharge Instructions    Diet - low sodium heart healthy   Complete by: As directed    Increase activity slowly   Complete by: As directed      Allergies as of 03/24/2019      Reactions   Lipitor [atorvastatin] Other (See Comments)   myalgias myalgias   Lisinopril Other (See Comments)   cough cough   Amaryl [glimepiride]    Side effects   Anoro Ellipta [umeclidinium-vilanterol]    Increase BP   Avelox [moxifloxacin Hcl In Nacl]    nausea   Ciprofloxacin    nausea   Other Other (See Comments)   Increased cough   Prevacid [lansoprazole]    diarrhea   Spiriva Handihaler [tiotropium Bromide Monohydrate]    Increased cough   Symbicort [budesonide-formoterol Fumarate] Cough   Increased cough      Medication List    TAKE these medications   acetaminophen 500 MG tablet Commonly known as: TYLENOL Take 500 mg by mouth every 6 (six) hours as needed for headache.   alendronate 70 MG tablet Commonly known as: FOSAMAX Take 70 mg by mouth once a week. Take with a full glass of water on an empty stomach.   amLODipine 10 MG tablet Commonly known as: NORVASC Take 1 tablet (10 mg total) by mouth daily.   amoxicillin-clavulanate 500-125 MG tablet Commonly known as: Augmentin Take 1 tablet (500 mg total) by mouth 2 (two) times a day for 4 days.   Anoro Ellipta 62.5-25 MCG/INH Aepb Generic drug: umeclidinium-vilanterol USE 1 INHALATION BY MOUTH  DAILY What changed: See the new instructions.   aspirin EC 81 MG tablet Take 81 mg by mouth at bedtime.    buPROPion 150 MG 24 hr tablet Commonly known as: WELLBUTRIN XL Take 150 mg by mouth daily.   cholecalciferol 25 MCG (1000 UT) tablet Commonly known as: VITAMIN D Take 1,000 Units by mouth at bedtime.   COLACE PO Take 100 mg by mouth 2 (two) times daily.   dextromethorphan-guaiFENesin 30-600 MG 12hr tablet Commonly known as: MUCINEX DM Take 1 tablet by mouth 2 (two) times daily.   feeding supplement (GLUCERNA SHAKE) Liqd Take 237 mLs by mouth 3 (three) times daily between meals.   fluticasone 220 MCG/ACT inhaler Commonly known as: FLOVENT HFA Inhale 2 puffs into the lungs 2 (two) times daily.   hydrochlorothiazide 12.5 MG tablet Commonly known as: HYDRODIURIL Take 12.5 mg by mouth every morning.   irbesartan 300 MG tablet Commonly known as: AVAPRO Take 300 mg by mouth daily.   Iron 325 (65 Fe) MG Tabs Take 325 mg by mouth 2 (two) times daily.   Magnesium 250 MG Tabs Take 250 mg by mouth at bedtime.   metFORMIN 500 MG 24 hr tablet Commonly known as: GLUCOPHAGE-XR Take 500 mg by mouth every evening. With [place] FORMULA PO Take 1 capsule by mouth daily.   polyethylene glycol 17 g packet Commonly known as: MIRALAX / GLYCOLAX Take 17 g by mouth daily as needed for mild constipation.   pravastatin 40 MG tablet Commonly known as: PRAVACHOL Take 40 mg by mouth every evening.   predniSONE 10 MG tablet Commonly known as: DELTASONE Take 4  tabs for 2 days, then 3 tabs for 2 days, then 2 tabs for 2 days, then 1 tab for 2 days, then stop   ProAir HFA 108 (90 Base) MCG/ACT inhaler Generic drug: albuterol Inhale 2 puffs into the lungs 4 (four) times daily as needed for wheezing or shortness of breath.   albuterol (2.5 MG/3ML) 0.083% nebulizer solution Commonly known as: PROVENTIL Take 3 mLs (2.5 mg total) by nebulization every 6 (six) hours as needed for wheezing.   sertraline 100 MG tablet Commonly known as: ZOLOFT Take 1 tablet (100 mg total)  by mouth every morning.   traMADol 50 MG tablet Commonly known as: Ultram Take 1 tablet (50 mg total) by mouth every 6 (six) hours as needed.       Follow-up Information    Harlan Stains, MD. Schedule an appointment as soon as possible for a visit in 1 week(s).   Specialty: Family Medicine Contact information: [place] [address] [phone]        [facility]. Schedule an appointment as soon as possible for a visit in 1 week(s).   Contact information: [place] [phone]         Allergies  Allergen Reactions  . Lipitor [Atorvastatin] Other (See Comments)    myalgias myalgias  . Lisinopril Other (See Comments)    cough cough  . Amaryl [Glimepiride]     Side effects  . Anoro Ellipta [Umeclidinium-Vilanterol]     Increase BP  . Avelox [Moxifloxacin Hcl In Nacl]     nausea  . Ciprofloxacin     nausea  . Other Other (See Comments)    Increased cough  . Prevacid [Lansoprazole]     diarrhea  . Spiriva Handihaler [Tiotropium Bromide Monohydrate]     Increased cough  . Symbicort [Budesonide-Formoterol Fumarate] Cough    Increased cough    Consultations:  Palliative care   Procedures/Studies: Ct Angio Chest Pe W/cm &/or Wo Cm  Result Date: 03/21/2019 CLINICAL DATA:  Shortness of breath. EXAM: CT ANGIOGRAPHY CHEST WITH CONTRAST TECHNIQUE: Multidetector CT imaging of the chest was performed using the standard protocol during bolus administration of intravenous contrast. Multiplanar CT image reconstructions and MIPs were obtained to evaluate the vascular anatomy. CONTRAST:  165mL OMNIPAQUE IOHEXOL 350 MG/ML SOLN COMPARISON:  Radiograph of same day.  CT scan of Feb 06, 2019. FINDINGS: Cardiovascular: Satisfactory opacification of the pulmonary arteries to the segmental level. No evidence of pulmonary embolism. Normal heart size. No pericardial effusion. Enlargement of the main  pulmonary artery is noted consistent with pulmonary artery hypertension. Atherosclerosis of thoracic aorta is noted without aneurysm formation. Status post aortic valve repair. Mediastinum/Nodes: Large sliding-type hiatal hernia is noted. No adenopathy is noted. Thyroid gland is unremarkable. Lungs/Pleura: Severe emphysematous disease is noted in both upper lobes. No pneumothorax is noted. Increased bilateral lower lobe airspace opacities are noted concerning for worsening atelectasis or possibly infiltrates. Upper Abdomen: No acute abnormality. Musculoskeletal: No chest wall abnormality. No acute or significant osseous findings. Review of the MIP images confirms the above findings. IMPRESSION: No definite evidence of pulmonary embolus. Increased bilateral lower lobe airspace opacities are noted concerning for worsening atelectasis or possibly pneumonia. Large sliding-type hiatal hernia. Enlargement of pulmonary artery is noted consistent with pulmonary artery hypertension. Status post aortic valve repair. Aortic Atherosclerosis (ICD10-I70.0) and Emphysema (ICD10-J43.9). Electronically Signed   By: Marijo Conception M.D.   On: 03/21/2019 15:30   [place] Fna Bx  Thyroid 1st Lesion Afirma  Result Date: 03/18/2019 INDICATION: Indeterminate thyroid nodules of the right and left inferior thyroid. Request is made for fine-needle aspiration of indeterminate thyroid nodules. EXAM: ULTRASOUND GUIDED FINE NEEDLE ASPIRATION OF INDETERMINATE THYROID NODULE COMPARISON:  US THYROID 03/07/19 MEDICATIONS: 2 mL 1% lidocaine COMPLICATIONS: None immediate. TECHNIQUE: Informed written consent was obtained from the patient after a discussion of the risks, benefits and alternatives to treatment. Questions regarding the procedure were encouraged and answered. A timeout was performed prior to the initiation of the procedure. Pre-procedural ultrasound scanning demonstrated unchanged size and appearance of the indeterminate nodules within the  right and left inferior thyroid. The procedure was planned. The neck was prepped in the usual sterile fashion, and a sterile drape was applied covering the operative field. A timeout was performed prior to the initiation of the procedure. Local anesthesia was provided with 1% lidocaine. Under direct ultrasound guidance, 3 FNA biopsies were performed of the right inferior thyroid nodule with a 25 gauge needle. Multiple ultrasound images were saved for procedural documentation purposes. The samples were prepared and submitted to pathology. Under direct ultrasound guidance, 4 FNA biopsies were performed of the left inferior thyroid nodule with a 25 gauge needle. Multiple ultrasound images were saved for procedural documentation purposes. The samples were prepared and submitted to pathology. Limited post procedural scanning was negative for hematoma or additional complication. Dressings were placed. The patient tolerated the above procedures procedure well without immediate postprocedural complication. FINDINGS: Nodule reference number based on prior diagnostic ultrasound: 4 Maximum size: 1.6 cm Location: Right; Inferior ACR TI-RADS risk category: TR4 (4-6 points) Reason for biopsy: meets ACR TI-RADS criteria _________________________________________________________ Nodule reference number based on prior diagnostic ultrasound: 6 Maximum size: 1.0 cm Location: Left; Inferior ACR TI-RADS risk category: TR5 (>/= 7 points) Reason for biopsy: meets ACR TI-RADS criteria Ultrasound imaging confirms appropriate placement of the needles within the thyroid nodule. IMPRESSION: 1. Technically successful ultrasound guided fine needle aspiration of indeterminate nodule of the right inferior thyroid. 2. Technically successful ultrasound guided fine needle aspiration of indeterminate nodule of the left inferior thyroid. Read by: Brynda Greathouse PA-C Electronically Signed   By: [name] M.D.   On: 03/18/2019 09:57   [place] Fna Bx  Thyroid 1st Lesion Afirma  Result Date: 03/18/2019 INDICATION: Indeterminate thyroid nodules of the right and left inferior thyroid. Request is made for fine-needle aspiration of indeterminate thyroid nodules. EXAM: ULTRASOUND GUIDED FINE NEEDLE ASPIRATION OF INDETERMINATE THYROID NODULE COMPARISON:  US THYROID 03/07/19 MEDICATIONS: 2 mL 1% lidocaine COMPLICATIONS: None immediate. TECHNIQUE: Informed written consent was obtained from the patient after a discussion of the risks, benefits and alternatives to treatment. Questions regarding the procedure were encouraged and answered. A timeout was performed prior to the initiation of the procedure. Pre-procedural ultrasound scanning demonstrated unchanged size and appearance of the indeterminate nodules within the right and left inferior thyroid. The procedure was planned. The neck was prepped in the usual sterile fashion, and a sterile drape was applied covering the operative field. A timeout was performed prior to the initiation of the procedure. Local anesthesia was provided with 1% lidocaine. Under direct ultrasound guidance, 3 FNA biopsies were performed of the right inferior thyroid nodule with a 25 gauge needle. Multiple ultrasound images were saved for procedural documentation purposes. The samples were prepared and submitted to pathology. Under direct ultrasound guidance, 4 FNA biopsies were performed of the left inferior thyroid nodule with a 25 gauge needle. Multiple ultrasound images were saved for procedural  documentation purposes. The samples were prepared and submitted to pathology. Limited post procedural scanning was negative for hematoma or additional complication. Dressings were placed. The patient tolerated the above procedures procedure well without immediate postprocedural complication. FINDINGS: Nodule reference number based on prior diagnostic ultrasound: 4 Maximum size: 1.6 cm Location: Right; Inferior ACR TI-RADS risk category: TR4 (4-6  points) Reason for biopsy: meets ACR TI-RADS criteria _________________________________________________________ Nodule reference number based on prior diagnostic ultrasound: 6 Maximum size: 1.0 cm Location: Left; Inferior ACR TI-RADS risk category: TR5 (>/= 7 points) Reason for biopsy: meets ACR TI-RADS criteria Ultrasound imaging confirms appropriate placement of the needles within the thyroid nodule. IMPRESSION: 1. Technically successful ultrasound guided fine needle aspiration of indeterminate nodule of the right inferior thyroid. 2. Technically successful ultrasound guided fine needle aspiration of indeterminate nodule of the left inferior thyroid. Read by: Brynda Greathouse PA-C Electronically Signed   By: [name] M.D.   On: 03/18/2019 09:57   Dg Chest Portable 1 View  Result Date: 03/21/2019 CLINICAL DATA:  Severe shortness of breath. EXAM: PORTABLE CHEST 1 VIEW COMPARISON:  CT chest dated Feb 06, 2019. Chest x-ray dated January 06, 2019. FINDINGS: The heart size and mediastinal contours are within normal limits. Prior aortic valve replacement. Atherosclerotic calcification of the aortic arch. Normal pulmonary vascularity. The lungs remain hyperinflated with severe bullous emphysematous changes. Bibasilar scarring. No focal consolidation, pleural effusion, or pneumothorax. No acute osseous abnormality. IMPRESSION: 1. No active disease. 2. Severe COPD. Electronically Signed   By: Titus Dubin M.D.   On: 03/21/2019 11:16   US Thyroid  Result Date: 03/07/2019 CLINICAL DATA:  Nodules. EXAM: THYROID ULTRASOUND TECHNIQUE: Ultrasound examination of the thyroid gland and adjacent soft tissues was performed. COMPARISON:  12/09/2015 FINDINGS: Parenchymal Echotexture: Moderately heterogenous Isthmus: 0.5 cm thickness, previously 0.4 Right lobe: 4 x 2 x 1.4 cm, previously 4.5 x 1.6 x 1.8 cm Left lobe: 3.7 x 1.3 x 0.9 cm, previously 3.5 x 1.1 x 1.3 _________________________________________________________  Estimated total number of nodules >/= 1 cm: 3 Number of spongiform nodules >/=  2 cm not described below (TR1): 0 Number of mixed cystic and solid nodules >/= 1.5 cm not described below ([place]): 0 _________________________________________________________ Nodule # 1: 0.6 cm complex cyst, superior right, previously 0.5; insert Nodule # 2: Location: Right; Inferior Maximum size: 0.7 cm; Other 2 dimensions: 0.6 x 0.6 cm Composition: solid/almost completely solid (2) Echogenicity: isoechoic (1) Shape: taller-than-wide (3) Margins: ill-defined (0) Echogenic foci: none (0) ACR TI-RADS total points: 6. ACR TI-RADS risk category: TR4 (4-6 points). ACR TI-RADS recommendations: Given size (<0.9 cm) and appearance, this nodule does NOT meet TI-RADS criteria for biopsy or dedicated follow-up. _________________________________________________________ Nodule # 3: Prior biopsy: No Location: Right; Inferior Maximum size: 1 cm; Other 2 dimensions: 1 x 1 cm, previously, 1.1 x 0.9 x 1 cm Composition: solid/almost completely solid (2) Echogenicity: isoechoic (1) Shape: not taller-than-wide (0) Margins: ill-defined (0) Echogenic foci: punctate echogenic foci (3) ACR TI-RADS total points: 6. ACR TI-RADS risk category:  TR4 (4-6 points). Significant change in size (>/= 20% in two dimensions and minimal increase of 2 mm): No Change in features: No Change in ACR TI-RADS risk category: No ACR TI-RADS recommendations: *Given size (>/= 1 - 1.4 cm) and appearance, a follow-up ultrasound in 1 year should be considered based on TI-RADS criteria. _________________________________________________________ Nodule # 4: Prior biopsy: No Location: Right; Inferior Maximum size: 1.6 cm; Other 2 dimensions: 0.9 x 0.8 cm, previously, 1.3 x 1.2 x  0.7 cm Composition: solid/almost completely solid (2) Echogenicity: isoechoic (1) Shape: not taller-than-wide (0) Margins: ill-defined (0) Echogenic foci: macrocalcifications (1) ACR TI-RADS total points: 4. ACR  TI-RADS risk category:  TR4 (4-6 points). Significant change in size (>/= 20% in two dimensions and minimal increase of 2 mm): No Change in features: No Change in ACR TI-RADS risk category: No ACR TI-RADS recommendations: **Given size (>/= 1.5 cm) and appearance, fine needle aspiration of this moderately suspicious nodule should be considered based on TI-RADS criteria. _________________________________________________________ Nodule # 5: Prior biopsy: No Location: Left; Superior Maximum size: 0.9 cm; Other 2 dimensions: 0.9 x 0.8 cm, previously, 0.9 x 0.7 x 0.5 cm Composition: solid/almost completely solid (2) Echogenicity: isoechoic (1) Shape: not taller-than-wide (0) Margins: ill-defined (0) Echogenic foci: punctate echogenic foci (3) ACR TI-RADS total points: 6. ACR TI-RADS risk category:  TR4 (4-6 points). Significant change in size (>/= 20% in two dimensions and minimal increase of 2 mm): No Change in features: No Change in ACR TI-RADS risk category: No ACR TI-RADS recommendations: Given size (<0.9 cm) and appearance, this nodule does NOT meet TI-RADS criteria for biopsy or dedicated follow-up. _________________________________________________________ Nodule # 6: Prior biopsy: No Location: Left; Inferior Maximum size: 1 cm; Other 2 dimensions: 1 x 0.7 cm, previously, 1.4 x 1 x 1.1 cm Composition: solid/almost completely solid (2) Echogenicity: isoechoic (1) Shape: taller-than-wide (3) Margins: ill-defined (0) Echogenic foci: macrocalcifications (1) ACR TI-RADS total points: 7. ACR TI-RADS risk category:  TR5 (>/= 7 points). Significant change in size (>/= 20% in two dimensions and minimal increase of 2 mm): No Change in features: No Change in ACR TI-RADS risk category: No ACR TI-RADS recommendations: **Given size (>/= 1.0 cm) and appearance, fine needle aspiration of this highly suspicious nodule should be considered based on TI-RADS criteria. _________________________________________________________  IMPRESSION: 1. Normal-sized thyroid with bilateral nodules. 2. Recommend FNA biopsy of 1 cm highly suspicious inferior left nodule AND 1.6 cm moderately suspicious inferior right nodule. 3. Recommend annual/biennial ultrasound follow-up of additional inferior right nodule as above, until stability x5 years confirmed. The above is in keeping with the ACR TI-RADS recommendations - J Am Coll Radiol 2017;14:587-595. Electronically Signed   By: Lucrezia Europe M.D.   On: 03/07/2019 17:20     Subjective: Patient seen and examined at bedside.  She feels slightly better.  Still has intermittent cough.  Shortness of breath is improving.  No overnight fever, nausea or vomiting.  Complains of some low back pain.  Discharge Exam: Vitals:   03/24/19 0858 03/24/19 0920  BP:  (!) 160/67  Pulse:    Resp:    Temp:    SpO2: 97%     General: Pt is alert, awake, not in acute distress.  Very thinly built.  Elderly female. Cardiovascular: rate controlled, S1/S2 + Respiratory: bilateral decreased breath sounds at bases with some scattered crackles.  No wheezing Abdominal: Soft, NT, ND, bowel sounds + Extremities: no edema, no cyanosis    The results of significant diagnostics from this hospitalization (including imaging, microbiology, ancillary and laboratory) are listed below for reference.     Microbiology: Recent Results (from the past 240 hour(s))  SARS Coronavirus 2 (CEPHEID- Performed in [facility] hospital lab), Hosp Order     Status: None   Collection Time: 03/21/19 11:02 AM   Specimen: Nasopharyngeal Swab  Result Value Ref Range Status   SARS Coronavirus 2 NEGATIVE NEGATIVE Final    Comment: (NOTE) If result is NEGATIVE SARS-CoV-2 target nucleic acids are  NOT DETECTED. The SARS-CoV-2 RNA is generally detectable in upper and lower  respiratory specimens during the acute phase of infection. The lowest  concentration of SARS-CoV-2 viral copies this assay can detect is 250  copies / mL. A negative  result does not preclude SARS-CoV-2 infection  and should not be used as the sole basis for treatment or other  patient management decisions.  A negative result may occur with  improper specimen collection / handling, submission of specimen other  than nasopharyngeal swab, presence of viral mutation(s) within the  areas targeted by this assay, and inadequate number of viral copies  (<250 copies / mL). A negative result must be combined with clinical  observations, patient history, and epidemiological information. If result is POSITIVE SARS-CoV-2 target nucleic acids are DETECTED. The SARS-CoV-2 RNA is generally detectable in upper and lower  respiratory specimens dur ing the acute phase of infection.  Positive  results are indicative of active infection with SARS-CoV-2.  Clinical  correlation with patient history and other diagnostic information is  necessary to determine patient infection status.  Positive results do  not rule out bacterial infection or co-infection with other viruses. If result is PRESUMPTIVE POSTIVE SARS-CoV-2 nucleic acids MAY BE PRESENT.   A presumptive positive result was obtained on the submitted specimen  and confirmed on repeat testing.  While 2019 novel coronavirus  (SARS-CoV-2) nucleic acids may be present in the submitted sample  additional confirmatory testing may be necessary for epidemiological  and / or clinical management purposes  to differentiate between  SARS-CoV-2 and other Sarbecovirus currently known to infect humans.  If clinically indicated additional testing with an alternate test  methodology [phone]) is advised. The SARS-CoV-2 RNA is generally  detectable in upper and lower respiratory sp ecimens during the acute  phase of infection. The expected result is Negative. Fact Sheet for Patients:  StrictlyIdeas.no Fact Sheet for Healthcare Providers: BankingDealers.co.za This test is not yet  approved or cleared by the [place] FDA and has been authorized for detection and/or diagnosis of SARS-CoV-2 by FDA under an Emergency Use Authorization (EUA).  This EUA will remain in effect (meaning this test can be used) for the duration of the COVID-19 declaration under Section 564(b)(1) of the Act, 21 U.S.C. section 360bbb-3(b)(1), unless the authorization is terminated or revoked sooner. Performed at [facility], [place] [address], [place], [place] [address]   Blood Culture (routine x 2)     Status: None (Preliminary result)   Collection Time: 03/21/19 11:52 AM   Specimen: BLOOD  Result Value Ref Range Status   Specimen Description BLOOD LEFT ANTECUBITAL  Final   Special Requests   Final    BOTTLES DRAWN AEROBIC ONLY Blood Culture adequate volume   Culture   Final    NO GROWTH 2 DAYS Performed at [facility], [place] [address], [place], [place] [address]    Report Status PENDING  Incomplete  Blood Culture (routine x 2)     Status: None (Preliminary result)   Collection Time: 03/21/19 11:52 AM   Specimen: BLOOD LEFT ARM  Result Value Ref Range Status   Specimen Description BLOOD LEFT ARM  Final   Special Requests   Final    BOTTLES DRAWN AEROBIC AND ANAEROBIC Blood Culture adequate volume   Culture   Final    NO GROWTH 2 DAYS Performed at [facility], [place] [address], [place],  [address]    Report Status PENDING  Incomplete  Labs: BNP (last 3 results) Recent Labs    03/21/19 1101  BNP 676.7*   Basic Metabolic Panel: Recent Labs  Lab 03/21/19 1052 03/21/19 1809 03/22/19 0351 03/23/19 0433 03/24/19 0748  NA 139  --  140 139 142  K 4.2  --  4.5 4.8 4.4  CL 105  --  109 109 109  CO2 21*  --  25 21* 23  GLUCOSE 338*  --  140* 205* 157*  BUN 26*  --  19 28* 34*  CREATININE 1.09*  --  0.91 1.01* 1.04*  CALCIUM 9.9  --  8.9 9.4 9.9  MG  --  2.1  --  2.2 2.0   Liver Function Tests: Recent Labs  Lab 03/21/19 1052 03/22/19 0351   AST 40 19  ALT 48* 34  ALKPHOS 88 74  BILITOT 0.7 0.6  PROT 7.4 6.1*  ALBUMIN 3.2* 2.7*   No results for input(s): LIPASE, AMYLASE in the last 168 hours. No results for input(s): AMMONIA in the last 168 hours. CBC: Recent Labs  Lab 03/21/19 1052 03/22/19 0351 03/23/19 0433 03/24/19 0748  WBC 33.3* 28.1* 11.7* 11.5*  NEUTROABS 30.0*  --  10.2* 9.9*  HGB 11.8* 10.1* 10.1* 11.2*  HCT 38.5 33.1* 32.4* 35.7*  MCV 95.3 95.7 93.9 93.2  PLT 375 305 327 380   Cardiac Enzymes: No results for input(s): CKTOTAL, CKMB, CKMBINDEX, TROPONINI in the last 168 hours. BNP: Invalid input(s): POCBNP CBG: Recent Labs  Lab 03/23/19 0758 03/23/19 1235 03/23/19 1607 03/23/19 2056 03/24/19 0802  GLUCAP 162* 199* 296* 175* 138*   D-Dimer Recent Labs    03/21/19 1152  DDIMER 3.04*   Hgb A1c Recent Labs    03/21/19 1809  HGBA1C 6.7*   Lipid Profile No results for input(s): CHOL, HDL, LDLCALC, TRIG, CHOLHDL, LDLDIRECT in the last 72 hours. Thyroid function studies Recent Labs    03/21/19 1809  TSH 1.768   Anemia work up No results for input(s): VITAMINB12, FOLATE, FERRITIN, TIBC, IRON, RETICCTPCT in the last 72 hours. Urinalysis    Component Value Date/Time   COLORURINE YELLOW 03/02/2017 2156   APPEARANCEUR HAZY (A) 03/02/2017 2156   LABSPEC 1.014 03/02/2017 2156   PHURINE 5.0 03/02/2017 2156   GLUCOSEU NEGATIVE 03/02/2017 2156   HGBUR NEGATIVE 03/02/2017 2156   [place] NEGATIVE 03/02/2017 2156   KETONESUR NEGATIVE 03/02/2017 2156   PROTEINUR 30 (A) 03/02/2017 2156   UROBILINOGEN 0.2 07/11/2013 1416   NITRITE NEGATIVE 03/02/2017 2156   LEUKOCYTESUR SMALL (A) 03/02/2017 2156   Sepsis Labs Invalid input(s): PROCALCITONIN,  WBC,  LACTICIDVEN Microbiology Recent Results (from the past 240 hour(s))  SARS Coronavirus 2 (CEPHEID- Performed in [place] hospital lab), Hosp Order     Status: None   Collection Time: 03/21/19 11:02 AM   Specimen: Nasopharyngeal Swab   Result Value Ref Range Status   SARS Coronavirus 2 NEGATIVE NEGATIVE Final    Comment: (NOTE) If result is NEGATIVE SARS-CoV-2 target nucleic acids are NOT DETECTED. The SARS-CoV-2 RNA is generally detectable in upper and lower  respiratory specimens during the acute phase of infection. The lowest  concentration of SARS-CoV-2 viral copies this assay can detect is 250  copies / mL. A negative result does not preclude SARS-CoV-2 infection  and should not be used as the sole basis for treatment or other  patient management decisions.  A negative result may occur with  improper specimen collection / handling, submission of specimen other  than nasopharyngeal swab, presence of  viral mutation(s) within the  areas targeted by this assay, and inadequate number of viral copies  (<250 copies / mL). A negative result must be combined with clinical  observations, patient history, and epidemiological information. If result is POSITIVE SARS-CoV-2 target nucleic acids are DETECTED. The SARS-CoV-2 RNA is generally detectable in upper and lower  respiratory specimens dur ing the acute phase of infection.  Positive  results are indicative of active infection with SARS-CoV-2.  Clinical  correlation with patient history and other diagnostic information is  necessary to determine patient infection status.  Positive results do  not rule out bacterial infection or co-infection with other viruses. If result is PRESUMPTIVE POSTIVE SARS-CoV-2 nucleic acids MAY BE PRESENT.   A presumptive positive result was obtained on the submitted specimen  and confirmed on repeat testing.  While 2019 novel coronavirus  (SARS-CoV-2) nucleic acids may be present in the submitted sample  additional confirmatory testing may be necessary for epidemiological  and / or clinical management purposes  to differentiate between  SARS-CoV-2 and other Sarbecovirus currently known to infect humans.  If clinically indicated additional  testing with an alternate test  methodology [phone]) is advised. The SARS-CoV-2 RNA is generally  detectable in upper and lower respiratory sp ecimens during the acute  phase of infection. The expected result is Negative. Fact Sheet for Patients:  StrictlyIdeas.no Fact Sheet for Healthcare Providers: BankingDealers.co.za This test is not yet approved or cleared by the [place] FDA and has been authorized for detection and/or diagnosis of SARS-CoV-2 by FDA under an Emergency Use Authorization (EUA).  This EUA will remain in effect (meaning this test can be used) for the duration of the COVID-19 declaration under Section 564(b)(1) of the Act, 21 U.S.C. section 360bbb-3(b)(1), unless the authorization is terminated or revoked sooner. Performed at [facility], [place] [address], [place], [place] [address]   Blood Culture (routine x 2)     Status: None (Preliminary result)   Collection Time: 03/21/19 11:52 AM   Specimen: BLOOD  Result Value Ref Range Status   Specimen Description BLOOD LEFT ANTECUBITAL  Final   Special Requests   Final    BOTTLES DRAWN AEROBIC ONLY Blood Culture adequate volume   Culture   Final    NO GROWTH 2 DAYS Performed at [facility], [place] [address] [place], [place] [address]    Report Status PENDING  Incomplete  Blood Culture (routine x 2)     Status: None (Preliminary result)   Collection Time: 03/21/19 11:52 AM   Specimen: BLOOD LEFT ARM  Result Value Ref Range Status   Specimen Description BLOOD LEFT ARM  Final   Special Requests   Final    BOTTLES DRAWN AEROBIC AND ANAEROBIC Blood Culture adequate volume   Culture   Final    NO GROWTH 2 DAYS Performed at [facility], [place] [address], [place], [place] [address]    Report Status PENDING  Incomplete     Time coordinating discharge: 35 minutes  SIGNED:   Aline August, MD  [facility] 03/24/2019, 10:09 AM

## 2019-03-24 NOTE — Progress Notes (Addendum)
  Speech Language Pathology Treatment: Dysphagia  Patient Details Name: [name] MRN: [id number] DOB: 04-29-1932 Today's Date: 03/24/2019 Time: 1216-2446 SLP Time Calculation (min) (ACUTE ONLY): 22 min  Assessment / Plan / Recommendation Clinical Impression  Reviewed compensatory strategies and pharyngeal exercises with Pt. Also discussed with importance of oral care with rationale. Pt to have f/u SLP, MD reports he ordered Home Health. Pt tolerating current diet relatively well, she is happier with plan. Continue exercise schedule and finely chopped diet with teaspoon sips of thin liquids as recommended.   HPI        SLP Plan  Continue with current plan of care       Recommendations  Diet recommendations: Thin liquid;Dysphagia 2 (fine chop) Medication Administration: Crushed with puree Supervision: Intermittent supervision to cue for compensatory strategies Compensations: Slow rate;Small sips/bites;Effortful swallow                Oral Care Recommendations: Oral care BID Follow up Recommendations: Home health SLP SLP Visit Diagnosis: Dysphagia, oropharyngeal phase (R13.12) Plan: Continue with current plan of care       GO              Herbie Baltimore, MA [place] Pager [phone] Office [phone]   Lynann Beaver 03/24/2019, 10:57 AM

## 2019-03-24 NOTE — Progress Notes (Signed)
Physical Therapy Treatment Patient Details Name: [name] MRN: [id number] DOB: 1931-10-21 Today's Date: 03/24/2019    History of Present Illness Patient is a 85 y/o female who presents with SOB and hypoxia. CT angiogram of the chest-negative for PE but showed increasing bilateral lower lobe opacities. Also with acute on chronic respiratory failure. PMH includes COPD, depression, HTN, DM, CAD s/p CABG, stage I non-small cell lung cancer s/p radiation with last treatment in May 2020, aortic stenosis s/p replacement, HLD.    PT Comments    Patient seen for mobility progression. Pt tolerated gait training distance of 90 ft with min A and RW and with SpO2 desat to 88% on 3L O2 via [place]. Pt will continue to benefit from further skilled PT services to maximize independence and safety with mobility.     Follow Up Recommendations  Home health PT;Supervision for mobility/OOB;Supervision/Assistance - 24 hour     Equipment Recommendations  None recommended by PT    Recommendations for Other Services       Precautions / Restrictions Precautions Precautions: Fall Precaution Comments: hx of falls    Mobility  Bed Mobility               General bed mobility comments: pt OOB in chair upon arrival  Transfers Overall transfer level: Needs assistance Equipment used: None Transfers: Sit to/from Stand Sit to Stand: Min guard;Min assist         General transfer comment: cues for safety, hand placement, and use of AD; assist to steady  Ambulation/Gait Ambulation/Gait assistance: Min assist Gait Distance (Feet): 90 Feet Assistive device: Rolling walker (2 wheeled) Gait Pattern/deviations: Step-through pattern;Decreased stride length;Trunk flexed;Drifts right/left;Narrow base of support Gait velocity: decreased   General Gait Details: assist for balance; cues for upright posture and safe use of AD; pt maintains flexed posture and tends to keep RW far away    Stairs             Wheelchair Mobility    Modified Rankin (Stroke Patients Only)       Balance Overall balance assessment: Needs assistance;History of Falls Sitting-balance support: Feet supported;No upper extremity supported Sitting balance-Leahy Scale: Good Sitting balance - Comments: Able to reach outside BoS and donn socks without difficulty.   Standing balance support: During functional activity;Bilateral upper extremity supported;Single extremity supported Standing balance-Leahy Scale: Poor                              Cognition Arousal/Alertness: Awake/alert Behavior During Therapy: WFL for tasks assessed/performed Overall Cognitive Status: Impaired/Different from baseline Area of Impairment: Safety/judgement                         Safety/Judgement: Decreased awareness of safety;Decreased awareness of deficits            Exercises      General Comments General comments (skin integrity, edema, etc.): SpO2 dest to 88% on 3L O2 via [place] while mobilizing      Pertinent Vitals/Pain Pain Assessment: No/denies pain    Home Living                      Prior Function            PT Goals (current goals can now be found in the care plan section) Progress towards PT goals: Progressing toward goals    Frequency    Min 3X/week  PT Plan Current plan remains appropriate    Co-evaluation              AM-PAC PT "6 Clicks" Mobility   Outcome Measure  Help needed turning from your back to your side while in a flat bed without using bedrails?: A Little Help needed moving from lying on your back to sitting on the side of a flat bed without using bedrails?: A Little Help needed moving to and from a bed to a chair (including a wheelchair)?: A Little Help needed standing up from a chair using your arms (e.g., wheelchair or bedside chair)?: A Little Help needed to walk in hospital room?: A Little Help needed climbing 3-5 steps with a  railing? : A Lot 6 Click Score: 17    End of Session Equipment Utilized During Treatment: Oxygen;Gait belt Activity Tolerance: Patient tolerated treatment well Patient left: in chair;with call bell/phone within reach;with chair alarm set Nurse Communication: Mobility status PT Visit Diagnosis: Unsteadiness on feet (R26.81);Muscle weakness (generalized) (M62.81);Other (comment)(DOE)     Time: 4166-0630 PT Time Calculation (min) (ACUTE ONLY): 24 min  Charges:  $Gait Training: 23-37 mins                     Earney Navy, PTA Acute Rehabilitation Services Pager: [phone] Office: [phone]     Darliss Cheney 03/24/2019, 1:38 PM

## 2019-03-24 NOTE — Care Management Important Message (Signed)
Important Message  Patient Details  Name: YVETTA DROTAR MRN: [id number] Date of Birth: 1932/08/06   Medicare Important Message Given:  Yes     Shamia Uppal 03/24/2019, 1:40 PM

## 2019-03-24 NOTE — Care Management Important Message (Signed)
Important Message  Patient Details  Name: [name] MRN: [id number] Date of Birth: 1932-07-05   Medicare Important Message Given:  Yes     Myeisha Kruser 03/24/2019, 1:52 PM

## 2019-03-25 ENCOUNTER — Ambulatory Visit: Payer: Medicare Other | Admitting: Physical Therapy

## 2019-03-25 LAB — LEGIONELLA PNEUMOPHILA SEROGP 1 UR AG: L. pneumophila Serogp 1 Ur Ag: NEGATIVE

## 2019-03-25 LAB — PATHOLOGIST SMEAR REVIEW

## 2019-03-26 LAB — CULTURE, BLOOD (ROUTINE X 2)
Culture: NO GROWTH
Culture: NO GROWTH
Special Requests: ADEQUATE
Special Requests: ADEQUATE

## 2019-03-27 ENCOUNTER — Ambulatory Visit: Payer: Medicare Other | Admitting: Physical Therapy

## 2019-03-31 ENCOUNTER — Encounter: Payer: Self-pay | Admitting: Gastroenterology

## 2019-04-01 ENCOUNTER — Telehealth: Payer: Self-pay | Admitting: Adult Health Nurse Practitioner

## 2019-04-01 ENCOUNTER — Ambulatory Visit: Payer: Medicare Other | Admitting: Physical Therapy

## 2019-04-01 NOTE — Telephone Encounter (Signed)
Spoke with patient's husband Jenny Reichmann regarding Palliative services and he wanted me to call him back in a month due to patient is receiving home health services to see if he felt that she needed Palliative services at that time.  I told him I would follow-up with him in a month.

## 2019-04-03 ENCOUNTER — Ambulatory Visit: Payer: Medicare Other | Admitting: Physical Therapy

## 2019-04-08 ENCOUNTER — Ambulatory Visit: Payer: Medicare Other | Admitting: Physical Therapy

## 2019-04-09 ENCOUNTER — Telehealth: Payer: Self-pay | Admitting: Internal Medicine

## 2019-04-09 NOTE — Telephone Encounter (Signed)
No I will see her first. Thanks.

## 2019-04-09 NOTE — Telephone Encounter (Signed)
Returned call to patient and scheduled HFU for pneumonia. Pt has already seen per pcp who advised she f/u with pulm re: sob. Would you like patient to have a cxr before appt on Friday?   Pt placed a call on7/2 and you ordered prednisone and increased 02 to 3 L if needed. Pt ended up going to ED and being admitted. 7/3-7/6 for Community acquired pneumonia.  Recommendations for Outpatient Follow-up:  1. Follow up with PCP in 1 week with repeat CBC/BMP 2. Patient will benefit from outpatient palliative care evaluation and follow-up 3. Follow up in ED if symptoms worsen or new appear 4. Consider outpatient GI evaluation if persistently has dysphagia.

## 2019-04-09 NOTE — Telephone Encounter (Signed)
Attempted to call pt but unable to reach. Left pt a detailed message letting her know that we would have her come for the OV and if any imaging needed to be done after OV, we would get that taken care of at that time.nothing further needed.

## 2019-04-10 ENCOUNTER — Ambulatory Visit: Payer: Medicare Other

## 2019-04-11 ENCOUNTER — Other Ambulatory Visit: Payer: Self-pay

## 2019-04-11 ENCOUNTER — Encounter: Payer: Self-pay | Admitting: Nurse Practitioner

## 2019-04-11 ENCOUNTER — Ambulatory Visit (INDEPENDENT_AMBULATORY_CARE_PROVIDER_SITE_OTHER): Payer: Medicare Other

## 2019-04-11 ENCOUNTER — Ambulatory Visit (INDEPENDENT_AMBULATORY_CARE_PROVIDER_SITE_OTHER): Payer: Medicare Other | Admitting: Nurse Practitioner

## 2019-04-11 VITALS — BP 128/70 | HR 67 | Temp 98.0°F | Ht 60.0 in | Wt 79.6 lb

## 2019-04-11 DIAGNOSIS — J189 Pneumonia, unspecified organism: Secondary | ICD-10-CM

## 2019-04-11 DIAGNOSIS — J441 Chronic obstructive pulmonary disease with (acute) exacerbation: Secondary | ICD-10-CM | POA: Diagnosis not present

## 2019-04-11 NOTE — Assessment & Plan Note (Addendum)
Patient presents today for hospital follow-up.  She was admitted on 03/21/2019 and discharged on 03/24/2019.  She was admitted for sepsis due to pneumonia.  She was treated with IV Rocephin and Zithromax.  He was sent home on Augmentin.  She was also sent home on prednisone.  Patient states that she has been stable since hospital discharge.  At hospital discharge she was ordered home PT and home palliative care.  Patient has refused both of these.  [facility] and is on the list for when they start back soon after being closed for current care by pandemic.  Patient continues on oxygen at 2 to 3 L nasal cannula.  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  She has been placed on aspiration precautions and states that she is following these precautions closely.  She has been referred to GI and does have an appointment upcoming with them.  He continues follow-up with oncology for lung cancer.   Patient Instructions  Will order chest x ray and call with results Continue Mucinex 600 mg for chest congestion once daily with a full glass of water. Continue Flutter valve 4 blows 4 times a day. Continue oxygen at 2L [place]  Saturation goals are 88-92% Continue Flovent 2 puffs twice daily. Rinse mouth after use Continue Pro Air/ Albuterol nebs  as needed for breakthrough shortness of breath Continue Anoro  Follow up: Follow up with Dr. Chase Caller or sooner if needed

## 2019-04-11 NOTE — Progress Notes (Signed)
@Patient  ID: [name], female    DOB: Jan 25, 1932, 85 y.o.   MRN: [id number]  Chief Complaint  Patient presents with   Hospitalization Follow-up    Pneumonia    Referring provider: Harlan Stains, MD  HPI  85 year old female former smoker with COPD gold stage 2 but severe reducton in dlco (fev may 2019 - 0.8L/60% and dlco 29%), type 2 diabetes, diabetes mellitus, coronary artery disease, GERD, Stage I lung cancer [presumed stage I LLL nodule - empiric XRT ending April 2019).  She follows with Dr. Chase Caller. History noted for PET positive, left lower lobe spiculated nodule Discussed at multidisciplinary tumor board and biopsy deferred, Underwent SBRT in April 2019  TESTS: Imaging: CT chest 08/28/2018- advanced emphysema, left lower lobe lung nodule with bandlike interstitial airspace opacity suggestive of radiation fibrosis. Chest x-ray 12/22 > moderate-sized left pneumothorax CXR 12/23- interval improvement in size of L-sided PTX, however small sized L PTX still present. Pigtail placement unchanged.  CXR 12/26 > no PTX CXR 09/19/18> No evidence of a pneumothorax following left-sided chest tube removal. No change in the appearance of the lungs since the earlier exam. CTA 03/21/19 - No definite evidence of pulmonary embolus. Increased bilateral lower lobe airspace opacities are noted concerning for worsening atelectasis or possibly pneumonia. Large sliding-type hiatal hernia. Enlargement of pulmonary artery is noted consistent with pulmonary artery hypertension. Status post aortic valve repair.  OV 04/11/19 - hospital follow up  Patient presents today for hospital follow-up.  She was admitted on 03/21/2019 and discharged on 03/24/2019.  She was admitted for sepsis due to pneumonia.  She was treated with IV Rocephin and Zithromax.  He was sent home on Augmentin.  She was also sent home on prednisone.  Patient states that she has been stable since hospital discharge.  At hospital discharge  she was ordered home PT and home palliative care.  Patient has refused both of these.  She does want to participate in pulmonary [facility] and is on the list for when they start back soon after being closed for current care by pandemic.  Patient continues on oxygen at 2 to 3 L nasal cannula.  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  She has been placed on aspiration precautions and states that she is following these precautions closely.  She has been referred to GI and does have an appointment upcoming with them.  He continues follow-up with oncology for lung cancer. Denies f/c/s, n/v/d, hemoptysis, PND, leg swelling.    Allergies  Allergen Reactions   Lipitor [Atorvastatin] Other (See Comments)    myalgias myalgias   Lisinopril Other (See Comments)    cough cough   Amaryl [Glimepiride]     Side effects   Anoro Ellipta [Umeclidinium-Vilanterol]     Increase BP   Avelox [Moxifloxacin Hcl In Nacl]     nausea   Ciprofloxacin     nausea   Other Other (See Comments)    Increased cough   Prevacid [Lansoprazole]     diarrhea   Spiriva Handihaler [Tiotropium Bromide Monohydrate]     Increased cough   Symbicort [Budesonide-Formoterol Fumarate] Cough    Increased cough    Immunization History  Administered Date(s) Administered   Influenza Split 07/19/2014   Influenza, High Dose Seasonal PF 06/16/2016, 05/29/2017, 06/17/2018   Influenza,inj,Quad PF,6+ Mos 06/19/2015   Pneumococcal Conjugate-13 09/18/2013   Pneumococcal Polysaccharide-23 05/14/2006, 08/12/2018   Tdap  03/04/2013   Zoster 08/20/2008, 08/18/2014    Past Medical History:  Diagnosis Date   Anxiety    Aortic stenosis    Arthritis    Asthma    uses inhaler as needed   Depression    Diabetes mellitus    Emphysema of lung (HCC)    GERD (gastroesophageal reflux disease)    Glaucoma    Hyperlipidemia     Hypertension    [name]    Murmur    Nephrolithiasis    Pneumonia 3/17 and 3/18    Tobacco History: Social History   Tobacco Use  Smoking Status Former Smoker   Packs/day: 2.00   Years: 15.00   Pack years: 30.00   Types: Cigarettes   Quit date: 09/18/1972   Years since quitting: 46.5  Smokeless Tobacco Never Used   Counseling given: Yes   Outpatient Encounter Medications as of 04/11/2019  Medication Sig   acetaminophen (TYLENOL) 500 MG tablet Take 500 mg by mouth every 6 (six) hours as needed for headache.   albuterol (PROVENTIL) (2.5 MG/3ML) 0.083% nebulizer solution Take 3 mLs (2.5 mg total) by nebulization every 6 (six) hours as needed for wheezing.   alendronate (FOSAMAX) 70 MG tablet Take 70 mg by mouth once a week. Take with a full glass of water on an empty stomach.   amLODipine (NORVASC) 10 MG tablet Take 1 tablet (10 mg total) by mouth daily.   ANORO ELLIPTA 62.5-25 MCG/INH AEPB USE 1 INHALATION BY MOUTH  DAILY (Patient taking differently: Inhale 1 puff into the lungs daily. )   aspirin EC 81 MG tablet Take 81 mg by mouth at bedtime.   buPROPion (WELLBUTRIN XL) 150 MG 24 hr tablet Take 150 mg by mouth daily.   cholecalciferol (VITAMIN D) 1000 units tablet Take 1,000 Units by mouth at bedtime.    Docusate Sodium (COLACE PO) Take 100 mg by mouth 2 (two) times daily.    feeding supplement, GLUCERNA SHAKE, (GLUCERNA SHAKE) LIQD Take 237 mLs by mouth 3 (three) times daily between meals.   Ferrous Sulfate (IRON) 325 (65 Fe) MG TABS Take 325 mg by mouth 2 (two) times daily.    fluticasone (FLOVENT HFA) 220 MCG/ACT inhaler Inhale 2 puffs into the lungs 2 (two) times daily.   irbesartan (AVAPRO) 300 MG tablet Take 300 mg by mouth daily.    Magnesium 250 MG TABS Take 250 mg by mouth at bedtime.    metFORMIN (GLUCOPHAGE-XR) 500 MG 24 hr tablet Take 500 mg by mouth every evening. With Dinner   Multiple Vitamins-Minerals (OCUVITE EYE  HEALTH FORMULA PO) Take 1 capsule by mouth daily.    polyethylene glycol (MIRALAX / GLYCOLAX) 17 g packet Take 17 g by mouth daily as needed for mild constipation.   pravastatin (PRAVACHOL) 40 MG tablet Take 40 mg by mouth every evening.    PROAIR HFA 108 (90 BASE) MCG/ACT inhaler Inhale 2 puffs into the lungs 4 (four) times daily as needed for wheezing or shortness of breath.    sertraline (ZOLOFT) 100 MG tablet Take 1 tablet (100 mg total) by mouth every morning.   traMADol (ULTRAM) 50 MG tablet Take 1 tablet (50 mg total) by mouth every 6 (six) hours as needed.   hydrochlorothiazide (HYDRODIURIL) 12.5 MG tablet Take 12.5 mg by mouth every morning.   [DISCONTINUED] dextromethorphan-guaiFENesin (MUCINEX DM) 30-600 MG 12hr tablet Take 1 tablet by mouth 2 (two) times daily. (Patient not taking: Reported on 04/11/2019)   [DISCONTINUED] predniSONE (  DELTASONE) 10 MG tablet Take 4 tabs for 2 days, then 3 tabs for 2 days, then 2 tabs for 2 days, then 1 tab for 2 days, then stop (Patient not taking: Reported on 04/11/2019)   No facility-administered encounter medications on file as of 04/11/2019.      Review of Systems  Review of Systems  Constitutional: Negative.  Negative for chills and fever.  HENT: Negative.   Respiratory: Negative for cough, shortness of breath and wheezing.   Cardiovascular: Negative.  Negative for chest pain, palpitations and leg swelling.  Gastrointestinal: Negative.   Allergic/Immunologic: Negative.   Neurological: Negative.   Psychiatric/Behavioral: Negative.        Physical Exam  BP 128/70 (BP Location: Left Arm, Patient Position: Sitting, Cuff Size: Small)    Pulse 67    Temp 98 F (36.7 C)    Ht 5' (1.524 m)    Wt 79 lb 9.6 oz (36.1 kg)    SpO2 96% Comment: 2L O2 pulse   BMI 15.55 kg/m   Wt Readings from Last 5 Encounters:  04/11/19 79 lb 9.6 oz (36.1 kg)  03/24/19 88 lb 6.5 oz (40.1 kg)  02/06/19 87 lb 8 oz (39.7 kg)  01/28/19 86 lb (39 kg)    11/18/18 90 lb 6.4 oz (41 kg)     Physical Exam Vitals signs and nursing note reviewed.  Constitutional:      General: She is not in acute distress.    Appearance: She is well-developed.  Cardiovascular:     Rate and Rhythm: Normal rate and regular rhythm.  Pulmonary:     Effort: Pulmonary effort is normal.     Breath sounds: Normal breath sounds.  Neurological:     Mental Status: She is alert and oriented to person, place, and time.     Imaging: Dg Chest 2 View  Result Date: 04/11/2019 CLINICAL DATA:  Pneumonia follow up.  Hx asthma, DM, HTN, emphysema. EXAM: CHEST - 2 VIEW COMPARISON:  03/21/2019 chest x-ray and chest CT FINDINGS: Median sternotomy and valve replacement. Dense atherosclerotic calcification of the thoracic aorta. The lungs are markedly hyperinflated with marked emphysematous changes. Lung markings at the bases are crowded, consistent with emphysematous changes. No new consolidation identified. There is costophrenic angle blunting secondary to emphysema. No definite pleural effusions. No pulmonary edema. Multiple subacute or chronic rib fractures. IMPRESSION: No active cardiopulmonary disease. Electronically Signed   By: Nolon Nations M.D.   On: 04/11/2019 14:25   Ct Angio Chest Pe W/cm &/or Wo Cm  Result Date: 03/21/2019 CLINICAL DATA:  Shortness of breath. EXAM: CT ANGIOGRAPHY CHEST WITH CONTRAST TECHNIQUE: Multidetector CT imaging of the chest was performed using the standard protocol during bolus administration of intravenous contrast. Multiplanar CT image reconstructions and MIPs were obtained to evaluate the vascular anatomy. CONTRAST:  171mL OMNIPAQUE IOHEXOL 350 MG/ML SOLN COMPARISON:  Radiograph of same day.  CT scan of Feb 06, 2019. FINDINGS: Cardiovascular: Satisfactory opacification of the pulmonary arteries to the segmental level. No evidence of pulmonary embolism. Normal heart size. No pericardial effusion. Enlargement of the main pulmonary artery is noted  consistent with pulmonary artery hypertension. Atherosclerosis of thoracic aorta is noted without aneurysm formation. Status post aortic valve repair. Mediastinum/Nodes: Large sliding-type hiatal hernia is noted. No adenopathy is noted. Thyroid gland is unremarkable. Lungs/Pleura: Severe emphysematous disease is noted in both upper lobes. No pneumothorax is noted. Increased bilateral lower lobe airspace opacities are noted concerning for worsening atelectasis or possibly infiltrates. Upper Abdomen:  No acute abnormality. Musculoskeletal: No chest wall abnormality. No acute or significant osseous findings. Review of the MIP images confirms the above findings. IMPRESSION: No definite evidence of pulmonary embolus. Increased bilateral lower lobe airspace opacities are noted concerning for worsening atelectasis or possibly pneumonia. Large sliding-type hiatal hernia. Enlargement of pulmonary artery is noted consistent with pulmonary artery hypertension. Status post aortic valve repair. Aortic Atherosclerosis (ICD10-I70.0) and Emphysema (ICD10-J43.9). Electronically Signed   By: Marijo Conception M.D.   On: 03/21/2019 15:30   [place] Fna Bx Thyroid 1st Lesion Afirma  Result Date: 03/18/2019 INDICATION: Indeterminate thyroid nodules of the right and left inferior thyroid. Request is made for fine-needle aspiration of indeterminate thyroid nodules. EXAM: ULTRASOUND GUIDED FINE NEEDLE ASPIRATION OF INDETERMINATE THYROID NODULE COMPARISON:  US THYROID 03/07/19 MEDICATIONS: 2 mL 1% lidocaine COMPLICATIONS: None immediate. TECHNIQUE: Informed written consent was obtained from the patient after a discussion of the risks, benefits and alternatives to treatment. Questions regarding the procedure were encouraged and answered. A timeout was performed prior to the initiation of the procedure. Pre-procedural ultrasound scanning demonstrated unchanged size and appearance of the indeterminate nodules within the right and left inferior  thyroid. The procedure was planned. The neck was prepped in the usual sterile fashion, and a sterile drape was applied covering the operative field. A timeout was performed prior to the initiation of the procedure. Local anesthesia was provided with 1% lidocaine. Under direct ultrasound guidance, 3 FNA biopsies were performed of the right inferior thyroid nodule with a 25 gauge needle. Multiple ultrasound images were saved for procedural documentation purposes. The samples were prepared and submitted to pathology. Under direct ultrasound guidance, 4 FNA biopsies were performed of the left inferior thyroid nodule with a 25 gauge needle. Multiple ultrasound images were saved for procedural documentation purposes. The samples were prepared and submitted to pathology. Limited post procedural scanning was negative for hematoma or additional complication. Dressings were placed. The patient tolerated the above procedures procedure well without immediate postprocedural complication. FINDINGS: Nodule reference number based on prior diagnostic ultrasound: 4 Maximum size: 1.6 cm Location: Right; Inferior ACR TI-RADS risk category: TR4 (4-6 points) Reason for biopsy: meets ACR TI-RADS criteria _________________________________________________________ Nodule reference number based on prior diagnostic ultrasound: 6 Maximum size: 1.0 cm Location: Left; Inferior ACR TI-RADS risk category: TR5 (>/= 7 points) Reason for biopsy: meets ACR TI-RADS criteria Ultrasound imaging confirms appropriate placement of the needles within the thyroid nodule. IMPRESSION: 1. Technically successful ultrasound guided fine needle aspiration of indeterminate nodule of the right inferior thyroid. 2. Technically successful ultrasound guided fine needle aspiration of indeterminate nodule of the left inferior thyroid. Read by: Brynda Greathouse PA-C Electronically Signed   By: [name] M.D.   On: 03/18/2019 09:57   [place] Fna Bx Thyroid 1st Lesion  Afirma  Result Date: 03/18/2019 INDICATION: Indeterminate thyroid nodules of the right and left inferior thyroid. Request is made for fine-needle aspiration of indeterminate thyroid nodules. EXAM: ULTRASOUND GUIDED FINE NEEDLE ASPIRATION OF INDETERMINATE THYROID NODULE COMPARISON:  US THYROID 03/07/19 MEDICATIONS: 2 mL 1% lidocaine COMPLICATIONS: None immediate. TECHNIQUE: Informed written consent was obtained from the patient after a discussion of the risks, benefits and alternatives to treatment. Questions regarding the procedure were encouraged and answered. A timeout was performed prior to the initiation of the procedure. Pre-procedural ultrasound scanning demonstrated unchanged size and appearance of the indeterminate nodules within the right and left inferior thyroid. The procedure was planned. The neck was prepped in the usual sterile fashion, and  a sterile drape was applied covering the operative field. A timeout was performed prior to the initiation of the procedure. Local anesthesia was provided with 1% lidocaine. Under direct ultrasound guidance, 3 FNA biopsies were performed of the right inferior thyroid nodule with a 25 gauge needle. Multiple ultrasound images were saved for procedural documentation purposes. The samples were prepared and submitted to pathology. Under direct ultrasound guidance, 4 FNA biopsies were performed of the left inferior thyroid nodule with a 25 gauge needle. Multiple ultrasound images were saved for procedural documentation purposes. The samples were prepared and submitted to pathology. Limited post procedural scanning was negative for hematoma or additional complication. Dressings were placed. The patient tolerated the above procedures procedure well without immediate postprocedural complication. FINDINGS: Nodule reference number based on prior diagnostic ultrasound: 4 Maximum size: 1.6 cm Location: Right; Inferior ACR TI-RADS risk category: TR4 (4-6 points) Reason for  biopsy: meets ACR TI-RADS criteria _________________________________________________________ Nodule reference number based on prior diagnostic ultrasound: 6 Maximum size: 1.0 cm Location: Left; Inferior ACR TI-RADS risk category: TR5 (>/= 7 points) Reason for biopsy: meets ACR TI-RADS criteria Ultrasound imaging confirms appropriate placement of the needles within the thyroid nodule. IMPRESSION: 1. Technically successful ultrasound guided fine needle aspiration of indeterminate nodule of the right inferior thyroid. 2. Technically successful ultrasound guided fine needle aspiration of indeterminate nodule of the left inferior thyroid. Read by: Brynda Greathouse PA-C Electronically Signed   By: [name] M.D.   On: 03/18/2019 09:57   Dg Chest Portable 1 View  Result Date: 03/21/2019 CLINICAL DATA:  Severe shortness of breath. EXAM: PORTABLE CHEST 1 VIEW COMPARISON:  CT chest dated Feb 06, 2019. Chest x-ray dated January 06, 2019. FINDINGS: The heart size and mediastinal contours are within normal limits. Prior aortic valve replacement. Atherosclerotic calcification of the aortic arch. Normal pulmonary vascularity. The lungs remain hyperinflated with severe bullous emphysematous changes. Bibasilar scarring. No focal consolidation, pleural effusion, or pneumothorax. No acute osseous abnormality. IMPRESSION: 1. No active disease. 2. Severe COPD. Electronically Signed   By: Titus Dubin M.D.   On: 03/21/2019 11:16   Dg Swallowing Func-speech Pathology  Result Date: 03/31/2019 Please refer to "Notes" tab for Speech Pathology notes.    Assessment & Plan:   Community acquired pneumonia, bilateral Patient presents today for hospital follow-up.  She was admitted on 03/21/2019 and discharged on 03/24/2019.  She was admitted for sepsis due to pneumonia.  She was treated with IV Rocephin and Zithromax.  He was sent home on Augmentin.  She was also sent home on prednisone.  Patient states that she has been stable  since hospital discharge.  At hospital discharge she was ordered home PT and home palliative care.  Patient has refused both of these.  She does want to participate in pulmonary [facility]hab [facility] and is on the list for when they start back soon after being closed for current care by pandemic.  Patient continues on oxygen at 2 to 3 L nasal cannula.  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  She has been placed on aspiration precautions and states that she is following these precautions closely.  She has been referred to GI and does have an appointment upcoming with them.  He continues follow-up with oncology for lung cancer.   Patient Instructions  Will order chest x ray and call with results Continue Mucinex 600 mg for chest congestion once daily with a full  glass of water. Continue Flutter valve 4 blows 4 times a day. Continue oxygen at 2L [place]  Saturation goals are 88-92% Continue Flovent 2 puffs twice daily. Rinse mouth after use Continue Pro Air/ Albuterol nebs  as needed for breakthrough shortness of breath Continue Anoro  Follow up: Follow up with Dr. Chase Caller or sooner if needed       Fenton Foy, NP 04/11/2019

## 2019-04-11 NOTE — Patient Instructions (Addendum)
Will order chest x ray and call with results Continue Mucinex 600 mg for chest congestion once daily with a full glass of water. Continue Flutter valve 4 blows 4 times a day. Continue oxygen at 2L [place]  Saturation goals are 88-92% Continue Flovent 2 puffs twice daily. Rinse mouth after use Continue Pro Air/ Albuterol nebs  as needed for breakthrough shortness of breath Continue Anoro  Follow up: Follow up with Dr. Chase Caller in 3 months or sooner if needed

## 2019-04-11 NOTE — Assessment & Plan Note (Deleted)
jhgd,[id number]

## 2019-04-15 ENCOUNTER — Ambulatory Visit: Payer: Medicare Other | Admitting: Physical Therapy

## 2019-04-17 ENCOUNTER — Ambulatory Visit: Payer: Medicare Other | Admitting: Physical Therapy

## 2019-05-01 ENCOUNTER — Telehealth: Payer: Self-pay | Admitting: Adult Health Nurse Practitioner

## 2019-05-01 NOTE — Telephone Encounter (Signed)
Spoke with patient about scheduling Palliative Consult and she said "Not interested, thank you anyway" and hung the phone up.  I have cancelled the referral and have notified Shirlee in  Dr. Orest Dikes office of above.

## 2019-05-05 ENCOUNTER — Ambulatory Visit (INDEPENDENT_AMBULATORY_CARE_PROVIDER_SITE_OTHER): Payer: Medicare Other | Admitting: Gastroenterology

## 2019-05-05 ENCOUNTER — Encounter: Payer: Self-pay | Admitting: Gastroenterology

## 2019-05-05 VITALS — BP 142/68 | HR 78 | Temp 97.8°F | Ht 60.0 in | Wt 83.2 lb

## 2019-05-05 DIAGNOSIS — J449 Chronic obstructive pulmonary disease, unspecified: Secondary | ICD-10-CM

## 2019-05-05 DIAGNOSIS — R1310 Dysphagia, unspecified: Secondary | ICD-10-CM | POA: Diagnosis not present

## 2019-05-05 NOTE — Progress Notes (Signed)
History of Present Illness: This is an 85 year old female referred by Harlan Stains, MD for the evaluation of dysphagia.  She is accompanied by her daughter.  Patient has multiple comorbidities including lung cancer status post radiation therapy with last treatment in May 2020, COPD, chronic hypoxic respiratory failure on home O2, aortic stenosis status post AoV replacement, diastolic heart failure, pulmonary hypertension, hypertension, diabetes she was hospitalized in July for pneumonia and underwent speech pathology evaluation during hospitalization which revealed moderate risk for aspiration with oropharyngeal deficits noted.  Multiple recommendations were made.  She had continuation of speech therapy following discharge from the hospital.  Patient relates difficulty swallowing large pills, large pieces of meat.  These problems are stable. Denies weight loss, abdominal pain, constipation, diarrhea, change in stool caliber, melena, hematochezia, nausea, vomiting, reflux symptoms, chest pain.     Allergies  Allergen Reactions  . Lipitor [Atorvastatin] Other (See Comments)    myalgias myalgias  . Lisinopril Other (See Comments)    cough cough  . Amaryl [Glimepiride]     Side effects  . Anoro Ellipta [Umeclidinium-Vilanterol]     Increase BP  . Avelox [Moxifloxacin Hcl In Nacl]     nausea  . Ciprofloxacin     nausea  . Other Other (See Comments)    Increased cough  . Prevacid [Lansoprazole]     diarrhea  . Spiriva Handihaler [Tiotropium Bromide Monohydrate]     Increased cough  . Symbicort [Budesonide-Formoterol Fumarate] Cough    Increased cough   Outpatient Medications Prior to Visit  Medication Sig Dispense Refill  . acetaminophen (TYLENOL) 500 MG tablet Take 500 mg by mouth every 6 (six) hours as needed for headache.    . albuterol (PROVENTIL) (2.5 MG/3ML) 0.083% nebulizer solution Take 3 mLs (2.5 mg total) by nebulization every 6 (six) hours as needed for wheezing. 75 mL 0   . alendronate (FOSAMAX) 70 MG tablet Take 70 mg by mouth once a week. Take with a full glass of water on an empty stomach.    [name] amLODipine (NORVASC) 10 MG tablet Take 1 tablet (10 mg total) by mouth daily. 90 tablet 3  . ANORO ELLIPTA 62.5-25 MCG/INH AEPB USE 1 INHALATION BY MOUTH  DAILY (Patient taking differently: Inhale 1 puff into the lungs daily. ) 180 each 0  . aspirin EC 81 MG tablet Take 81 mg by mouth at bedtime.    [name] buPROPion (WELLBUTRIN XL) 150 MG 24 hr tablet Take 150 mg by mouth daily.  2  . cholecalciferol (VITAMIN D) 1000 units tablet Take 1,000 Units by mouth at bedtime.     . cloNIDine (CATAPRES) 0.1 MG tablet Take 0.1 mg by mouth. 1/2 daily    . Dextromethorphan-guaiFENesin (ROBITUSSIN DM PO) Take by mouth.    Mariane Baumgarten Sodium (COLACE PO) Take 100 mg by mouth 2 (two) times daily.     . feeding supplement, GLUCERNA SHAKE, (GLUCERNA SHAKE) LIQD Take 237 mLs by mouth 3 (three) times daily between meals. 30 Can 0  . Ferrous Sulfate (IRON) 325 (65 Fe) MG TABS Take 325 mg by mouth 2 (two) times daily.     . fluticasone (FLOVENT HFA) 220 MCG/ACT inhaler Inhale 2 puffs into the lungs 2 (two) times daily.    . hydrochlorothiazide (HYDRODIURIL) 12.5 MG tablet Take 12.5 mg by mouth every morning.    . irbesartan (AVAPRO) 300 MG tablet Take 300 mg by mouth daily.     . Magnesium 250 MG TABS Take 250 mg  by mouth at bedtime.     . metFORMIN (GLUCOPHAGE-XR) 500 MG 24 hr tablet Take 500 mg by mouth every evening. With [employer]    . Multiple Vitamins-Minerals (OCUVITE EYE HEALTH FORMULA PO) Take 1 capsule by mouth daily.     . polyethylene glycol (MIRALAX / GLYCOLAX) 17 g packet Take 17 g by mouth daily as needed for mild constipation. 14 each 0  . pravastatin (PRAVACHOL) 40 MG tablet Take 40 mg by mouth every evening.     [name] PROAIR HFA 108 (90 BASE) MCG/ACT inhaler Inhale 2 puffs into the lungs 4 (four) times daily as needed for wheezing or shortness of breath.     . sertraline (ZOLOFT) 100 MG  tablet Take 1 tablet (100 mg total) by mouth every morning. 30 tablet 0  . traMADol (ULTRAM) 50 MG tablet Take 1 tablet (50 mg total) by mouth every 6 (six) hours as needed. 14 tablet 0   No facility-administered medications prior to visit.    Past Medical History:  Diagnosis Date  . Adenomatous colon polyp 1992  . Anxiety   . Aortic stenosis   . Arthritis   . Asthma    uses inhaler as needed  . CKD (chronic kidney disease)   . COPD (chronic obstructive pulmonary disease) ([place])   . Depression   . Diabetes mellitus   . Diverticulosis   . Emphysema of lung ([place])   . GERD (gastroesophageal reflux disease)   . Glaucoma   . Hiatal hernia   . Hyperlipidemia   . Hypertension   . [name]   . Murmur   . Nephrolithiasis   . Non-small cell lung cancer ([place])   . Osteoporosis   . Pneumonia 3/17 and 3/18   Past Surgical History:  Procedure Laterality Date  . ABDOMINAL HYSTERECTOMY    . ANKLE SURGERY  1998   d/t fx.  Left ankle  . ANTERIOR AND POSTERIOR REPAIR  06/21/2011   Procedure: ANTERIOR (CYSTOCELE) AND POSTERIOR REPAIR (RECTOCELE);  Surgeon: Maisie Fus;  Location: [facility];  Service: Gynecology;  Laterality: N/A;  . AORTIC VALVE REPLACEMENT N/A 07/17/2013   Procedure: AORTIC VALVE REPLACEMENT (AVR);  Surgeon: Ivin Poot, MD;  Location: [facility];  Service: Open Heart Surgery;  Laterality: N/A;  . CARDIAC CATHETERIZATION    . CARDIAC VALVE REPLACEMENT    . CATARACT EXTRACTION    . CORONARY ANGIOPLASTY    . CORONARY ARTERY BYPASS GRAFT N/A 07/17/2013   Procedure: CORONARY ARTERY BYPASS GRAFT, TIMES ONE, ON PUMP, USING RIGHT INTERNAL MAMMARY ARTERY.;  Surgeon: Ivin Poot, MD;  Location: [facility];  Service: Open Heart Surgery;  Laterality: N/A;  . FACIAL COSMETIC SURGERY     face lift  . INTRAOPERATIVE TRANSESOPHAGEAL ECHOCARDIOGRAM N/A 07/17/2013   Procedure: INTRAOPERATIVE TRANSESOPHAGEAL ECHOCARDIOGRAM;  Surgeon: Ivin Poot, MD;  Location: [facility];   Service: Open Heart Surgery;  Laterality: N/A;  . LAPAROSCOPIC ASSISTED VAGINAL HYSTERECTOMY  06/21/2011   Procedure: LAPAROSCOPIC ASSISTED VAGINAL HYSTERECTOMY;  Surgeon: Maisie Fus;  Location: [facility];  Service: Gynecology;  Laterality: N/A;  . LEFT AND RIGHT HEART CATHETERIZATION WITH CORONARY ANGIOGRAM N/A 06/27/2013   Procedure: LEFT AND RIGHT HEART CATHETERIZATION WITH CORONARY ANGIOGRAM;  Surgeon: Ramond Dial, MD;  Location: [facility] C[facility];  Service: Cardiovascular;  Laterality: N/A;  . NOSE SURGERY    . SALPINGOOPHORECTOMY  06/21/2011   Procedure: SALPINGO OOPHERECTOMY;  Surgeon: Maisie Fus;  Location: [facility];  Service:  Gynecology;  Laterality: Bilateral;  . VAGINAL PROLAPSE REPAIR  06/21/2011   Procedure: SACROPEXY;  Surgeon: Maisie Fus;  Location: [facility];  Service: Gynecology;  Laterality: N/A;  sacrospinous ligament suspension   Social History   Socioeconomic History  . Marital status: Married    Spouse name: Jenny Reichmann  . Number of children: 1  . Years of education: Not on file  . Highest education level: Not on file  Occupational History  . Occupation: retired Producer, television/film/video: RETIRED  Social Needs  . Financial resource strain: Not on file  . Food insecurity    Worry: Not on file    Inability: Not on file  . Transportation needs    Medical: Not on file    Non-medical: Not on file  Tobacco Use  . Smoking status: Former Smoker    Packs/day: 2.00    Years: 15.00    Pack years: 30.00    Types: Cigarettes    Quit date: 09/18/1972    Years since quitting: 46.6  . Smokeless tobacco: Never Used  Substance and Sexual Activity  . Alcohol use: No    Alcohol/week: 0.0 standard drinks  . Drug use: No  . Sexual activity: Never  Lifestyle  . Physical activity    Days per week: Not on file    Minutes per session: Not on file  . Stress: Not on file  Relationships  . Social Herbalist on phone: Not on file    Gets together: Not on file    Attends  religious service: Not on file    Active member of club or organization: Not on file    Attends meetings of clubs or organizations: Not on file    Relationship status: Not on file  Other Topics Concern  . Not on file  Social History Narrative  . Not on file   Family History  Problem Relation Age of Onset  . Emphysema Father   . Colon cancer Neg Hx   . Esophageal cancer Neg Hx   . Rectal cancer Neg Hx   . Stomach cancer Neg Hx        Review of Systems: Pertinent positive and negative review of systems were noted in the above HPI section. All other review of systems were otherwise negative.    Physical Exam: General: Well developed, well nourished, elderly, frail, debilitated, in a wheelchair no acute distress, using O2 by nasal cannula Head: Normocephalic and atraumatic Eyes:  sclerae anicteric, EOMI Ears: Normal auditory acuity Mouth: No deformity or lesions Neck: Supple, no masses or thyromegaly Lungs: Clear throughout to auscultation Heart: Regular rate and rhythm; no murmurs, rubs or bruits Abdomen: Soft, non tender and non distended. No masses, hepatosplenomegaly or hernias noted. Normal Bowel sounds Rectal: Not done Musculoskeletal: Symmetrical with no gross deformities  Skin: No lesions on visible extremities Pulses:  Normal pulses noted Extremities: No clubbing, cyanosis, edema or deformities noted Neurological: Alert oriented x 4, grossly nonfocal Cervical Nodes:  No significant cervical adenopathy Inguinal Nodes: No significant inguinal adenopathy Psychological:  Alert and cooperative. Normal mood and affect   Assessment and Recommendations:   1.  Oropharyngeal dysphagia with moderate aspiration risk.  Follow recommendations made by speech pathology. Rule out an esophageal disorder contributing to dysphagia.  Schedule barium esophagram.  Due to her multiple comorbidities she is very high risk for endoscopy and sedation. Very unlikely EGD will be pursued due to  very high risk. She and her daughter  understand and agree. Advised her to contact prescribing physicians for smaller pills and to adjust her diet as needed for ease of swallowing.  2.  COPD with chronic hypoxic respiratory failure maintained on home O2.  3.  Lung cancer with radiation therapy completed in May 2020   4. Status post aortic valve replacement.  5.  Pulmonary hypertension.  6.  Diastolic heart failure   cc: Harlan Stains, [place],  [place] [address]

## 2019-05-05 NOTE — Patient Instructions (Signed)
You have been scheduled for a Barium Esophogram at [facility] (1st floor of the hospital) on 05/07/19 at 10:30am. Please arrive 15 minutes prior to your appointment for registration. Make certain not to have anything to eat or drink 3 hours prior to your test. If you need to reschedule for any reason, please contact radiology at [phone] to do so. __________________________________________________________________ A barium swallow is an examination that concentrates on views of the esophagus. This tends to be a double contrast exam (barium and two liquids which, when combined, create a gas to distend the wall of the oesophagus) or single contrast (non-ionic iodine based). The study is usually tailored to your symptoms so a good history is essential. Attention is paid during the study to the form, structure and configuration of the esophagus, looking for functional disorders (such as aspiration, dysphagia, achalasia, motility and reflux) EXAMINATION You may be asked to change into a gown, depending on the type of swallow being performed. A radiologist and radiographer will perform the procedure. The radiologist will advise you of the type of contrast selected for your procedure and direct you during the exam. You will be asked to stand, sit or lie in several different positions and to hold a small amount of fluid in your mouth before being asked to swallow while the imaging is performed .In some instances you may be asked to swallow barium coated marshmallows to assess the motility of a solid food bolus. The exam can be recorded as a digital or video fluoroscopy procedure. POST PROCEDURE It will take 1-2 days for the barium to pass through your system. To facilitate this, it is important, unless otherwise directed, to increase your fluids for the next 24-48hrs and to resume your normal diet.  This test typically takes about 30 minutes to  perform. __________________________________________________________________Thank you for choosing me and [facility].  Pricilla Riffle. Dagoberto Ligas., MD., [facility]

## 2019-05-06 ENCOUNTER — Telehealth: Payer: Self-pay | Admitting: Gastroenterology

## 2019-05-06 ENCOUNTER — Emergency Department (HOSPITAL_COMMUNITY): Payer: Medicare Other

## 2019-05-06 ENCOUNTER — Other Ambulatory Visit: Payer: Self-pay

## 2019-05-06 ENCOUNTER — Inpatient Hospital Stay (HOSPITAL_COMMUNITY)
Admission: EM | Admit: 2019-05-06 | Discharge: 2019-05-20 | DRG: 190 | Disposition: E | Payer: Medicare Other | Attending: Internal Medicine | Admitting: Internal Medicine

## 2019-05-06 ENCOUNTER — Encounter (HOSPITAL_COMMUNITY): Payer: Self-pay | Admitting: Emergency Medicine

## 2019-05-06 DIAGNOSIS — Z515 Encounter for palliative care: Secondary | ICD-10-CM

## 2019-05-06 DIAGNOSIS — E1122 Type 2 diabetes mellitus with diabetic chronic kidney disease: Secondary | ICD-10-CM | POA: Diagnosis present

## 2019-05-06 DIAGNOSIS — Z20828 Contact with and (suspected) exposure to other viral communicable diseases: Secondary | ICD-10-CM | POA: Diagnosis present

## 2019-05-06 DIAGNOSIS — I5033 Acute on chronic diastolic (congestive) heart failure: Secondary | ICD-10-CM | POA: Diagnosis present

## 2019-05-06 DIAGNOSIS — Z66 Do not resuscitate: Secondary | ICD-10-CM | POA: Diagnosis present

## 2019-05-06 DIAGNOSIS — M810 Age-related osteoporosis without current pathological fracture: Secondary | ICD-10-CM | POA: Diagnosis present

## 2019-05-06 DIAGNOSIS — Z90710 Acquired absence of both cervix and uterus: Secondary | ICD-10-CM | POA: Diagnosis not present

## 2019-05-06 DIAGNOSIS — E785 Hyperlipidemia, unspecified: Secondary | ICD-10-CM | POA: Diagnosis present

## 2019-05-06 DIAGNOSIS — Z681 Body mass index (BMI) 19 or less, adult: Secondary | ICD-10-CM | POA: Diagnosis not present

## 2019-05-06 DIAGNOSIS — E43 Unspecified severe protein-calorie malnutrition: Secondary | ICD-10-CM | POA: Diagnosis present

## 2019-05-06 DIAGNOSIS — J9621 Acute and chronic respiratory failure with hypoxia: Secondary | ICD-10-CM | POA: Diagnosis present

## 2019-05-06 DIAGNOSIS — Z7982 Long term (current) use of aspirin: Secondary | ICD-10-CM | POA: Diagnosis not present

## 2019-05-06 DIAGNOSIS — E874 Mixed disorder of acid-base balance: Secondary | ICD-10-CM | POA: Diagnosis present

## 2019-05-06 DIAGNOSIS — Z87891 Personal history of nicotine dependence: Secondary | ICD-10-CM | POA: Diagnosis not present

## 2019-05-06 DIAGNOSIS — C3490 Malignant neoplasm of unspecified part of unspecified bronchus or lung: Secondary | ICD-10-CM | POA: Diagnosis present

## 2019-05-06 DIAGNOSIS — Z7984 Long term (current) use of oral hypoglycemic drugs: Secondary | ICD-10-CM

## 2019-05-06 DIAGNOSIS — I130 Hypertensive heart and chronic kidney disease with heart failure and stage 1 through stage 4 chronic kidney disease, or unspecified chronic kidney disease: Secondary | ICD-10-CM | POA: Diagnosis present

## 2019-05-06 DIAGNOSIS — N183 Chronic kidney disease, stage 3 (moderate): Secondary | ICD-10-CM | POA: Diagnosis present

## 2019-05-06 DIAGNOSIS — N179 Acute kidney failure, unspecified: Secondary | ICD-10-CM | POA: Diagnosis present

## 2019-05-06 DIAGNOSIS — F329 Major depressive disorder, single episode, unspecified: Secondary | ICD-10-CM | POA: Diagnosis present

## 2019-05-06 DIAGNOSIS — Z7189 Other specified counseling: Secondary | ICD-10-CM | POA: Diagnosis not present

## 2019-05-06 DIAGNOSIS — F419 Anxiety disorder, unspecified: Secondary | ICD-10-CM | POA: Diagnosis present

## 2019-05-06 DIAGNOSIS — Z79899 Other long term (current) drug therapy: Secondary | ICD-10-CM | POA: Diagnosis not present

## 2019-05-06 DIAGNOSIS — Z7951 Long term (current) use of inhaled steroids: Secondary | ICD-10-CM | POA: Diagnosis not present

## 2019-05-06 DIAGNOSIS — K219 Gastro-esophageal reflux disease without esophagitis: Secondary | ICD-10-CM | POA: Diagnosis present

## 2019-05-06 DIAGNOSIS — Z952 Presence of prosthetic heart valve: Secondary | ICD-10-CM

## 2019-05-06 DIAGNOSIS — Z951 Presence of aortocoronary bypass graft: Secondary | ICD-10-CM

## 2019-05-06 DIAGNOSIS — J9601 Acute respiratory failure with hypoxia: Secondary | ICD-10-CM

## 2019-05-06 DIAGNOSIS — E1165 Type 2 diabetes mellitus with hyperglycemia: Secondary | ICD-10-CM | POA: Diagnosis present

## 2019-05-06 DIAGNOSIS — R0602 Shortness of breath: Secondary | ICD-10-CM | POA: Diagnosis not present

## 2019-05-06 DIAGNOSIS — Z923 Personal history of irradiation: Secondary | ICD-10-CM

## 2019-05-06 DIAGNOSIS — T380X5A Adverse effect of glucocorticoids and synthetic analogues, initial encounter: Secondary | ICD-10-CM | POA: Diagnosis present

## 2019-05-06 DIAGNOSIS — J441 Chronic obstructive pulmonary disease with (acute) exacerbation: Secondary | ICD-10-CM | POA: Diagnosis present

## 2019-05-06 DIAGNOSIS — J9620 Acute and chronic respiratory failure, unspecified whether with hypoxia or hypercapnia: Secondary | ICD-10-CM | POA: Diagnosis present

## 2019-05-06 LAB — CBC WITH DIFFERENTIAL/PLATELET
Abs Immature Granulocytes: 0.02 10*3/uL (ref 0.00–0.07)
Basophils Absolute: 0 10*3/uL (ref 0.0–0.1)
Basophils Relative: 0 %
Eosinophils Absolute: 0 10*3/uL (ref 0.0–0.5)
Eosinophils Relative: 0 %
HCT: 41.7 % (ref 36.0–46.0)
Hemoglobin: 12.9 g/dL (ref 12.0–15.0)
Immature Granulocytes: 0 %
Lymphocytes Relative: 14 %
Lymphs Abs: 1.3 10*3/uL (ref 0.7–4.0)
MCH: 29.4 pg (ref 26.0–34.0)
MCHC: 30.9 g/dL (ref 30.0–36.0)
MCV: 95 fL (ref 80.0–100.0)
Monocytes Absolute: 1.4 10*3/uL — ABNORMAL HIGH (ref 0.1–1.0)
Monocytes Relative: 16 %
Neutro Abs: 6.3 10*3/uL (ref 1.7–7.7)
Neutrophils Relative %: 70 %
Platelets: 245 10*3/uL (ref 150–400)
RBC: 4.39 MIL/uL (ref 3.87–5.11)
RDW: 14.2 % (ref 11.5–15.5)
WBC: 9 10*3/uL (ref 4.0–10.5)
nRBC: 0 % (ref 0.0–0.2)

## 2019-05-06 LAB — URINALYSIS, ROUTINE W REFLEX MICROSCOPIC
Bilirubin Urine: NEGATIVE
Glucose, UA: NEGATIVE mg/dL
Hgb urine dipstick: NEGATIVE
Ketones, ur: 5 mg/dL — AB
Leukocytes,Ua: NEGATIVE
Nitrite: NEGATIVE
Protein, ur: >=300 mg/dL — AB
Specific Gravity, Urine: 1.022 (ref 1.005–1.030)
pH: 6 (ref 5.0–8.0)

## 2019-05-06 LAB — POCT I-STAT 7, (LYTES, BLD GAS, ICA,H+H)
Acid-base deficit: 4 mmol/L — ABNORMAL HIGH (ref 0.0–2.0)
Bicarbonate: 19.9 mmol/L — ABNORMAL LOW (ref 20.0–28.0)
Calcium, Ion: 1.24 mmol/L (ref 1.15–1.40)
HCT: 39 % (ref 36.0–46.0)
Hemoglobin: 13.3 g/dL (ref 12.0–15.0)
O2 Saturation: 97 %
Patient temperature: 98.6
Potassium: 4.6 mmol/L (ref 3.5–5.1)
Sodium: 137 mmol/L (ref 135–145)
TCO2: 21 mmol/L — ABNORMAL LOW (ref 22–32)
pCO2 arterial: 30.7 mmHg — ABNORMAL LOW (ref 32.0–48.0)
pH, Arterial: 7.42 (ref 7.350–7.450)
pO2, Arterial: 92 mmHg (ref 83.0–108.0)

## 2019-05-06 LAB — COMPREHENSIVE METABOLIC PANEL
ALT: 62 U/L — ABNORMAL HIGH (ref 0–44)
AST: 39 U/L (ref 15–41)
Albumin: 3.8 g/dL (ref 3.5–5.0)
Alkaline Phosphatase: 82 U/L (ref 38–126)
Anion gap: 16 — ABNORMAL HIGH (ref 5–15)
BUN: 26 mg/dL — ABNORMAL HIGH (ref 8–23)
CO2: 20 mmol/L — ABNORMAL LOW (ref 22–32)
Calcium: 9.7 mg/dL (ref 8.9–10.3)
Chloride: 100 mmol/L (ref 98–111)
Creatinine, Ser: 1.01 mg/dL — ABNORMAL HIGH (ref 0.44–1.00)
GFR calc Af Amer: 59 mL/min — ABNORMAL LOW (ref >60)
GFR calc non Af Amer: 51 mL/min — ABNORMAL LOW (ref >60)
Glucose, Bld: 190 mg/dL — ABNORMAL HIGH (ref 70–99)
Potassium: 4.3 mmol/L (ref 3.5–5.1)
Sodium: 136 mmol/L (ref 135–145)
Total Bilirubin: 0.8 mg/dL (ref 0.3–1.2)
Total Protein: 7.6 g/dL (ref 6.5–8.1)

## 2019-05-06 LAB — TROPONIN I (HIGH SENSITIVITY)
Troponin I (High Sensitivity): 13 ng/L (ref <18)
Troponin I (High Sensitivity): 20 ng/L — ABNORMAL HIGH (ref <18)

## 2019-05-06 LAB — MRSA PCR SCREENING: MRSA by PCR: NEGATIVE

## 2019-05-06 LAB — SARS CORONAVIRUS 2 BY RT PCR (HOSPITAL ORDER, PERFORMED IN ~~LOC~~ HOSPITAL LAB): SARS Coronavirus 2: NEGATIVE

## 2019-05-06 LAB — BRAIN NATRIURETIC PEPTIDE: B Natriuretic Peptide: 476.1 pg/mL — ABNORMAL HIGH (ref 0.0–100.0)

## 2019-05-06 MED ORDER — VITAMIN D 25 MCG (1000 UNIT) PO TABS
1000.0000 [IU] | ORAL_TABLET | Freq: Every day | ORAL | Status: DC
  Administered 2019-05-07: 22:00:00 1000 [IU] via ORAL
  Filled 2019-05-06: qty 1

## 2019-05-06 MED ORDER — ALBUTEROL SULFATE HFA 108 (90 BASE) MCG/ACT IN AERS
2.0000 | INHALATION_SPRAY | Freq: Four times a day (QID) | RESPIRATORY_TRACT | Status: DC
  Administered 2019-05-06: 2 via RESPIRATORY_TRACT
  Filled 2019-05-06: qty 6.7

## 2019-05-06 MED ORDER — BUPROPION HCL ER (XL) 150 MG PO TB24
150.0000 mg | ORAL_TABLET | Freq: Every day | ORAL | Status: DC
  Administered 2019-05-07 – 2019-05-08 (×2): 150 mg via ORAL
  Filled 2019-05-06 (×3): qty 1

## 2019-05-06 MED ORDER — IRBESARTAN 150 MG PO TABS
300.0000 mg | ORAL_TABLET | Freq: Every day | ORAL | Status: DC
  Administered 2019-05-07 – 2019-05-08 (×2): 300 mg via ORAL
  Filled 2019-05-06 (×2): qty 2

## 2019-05-06 MED ORDER — BUDESONIDE 0.25 MG/2ML IN SUSP: 0.2500 mg | Freq: Two times a day (BID) | RESPIRATORY_TRACT | Status: DC

## 2019-05-06 MED ORDER — IPRATROPIUM-ALBUTEROL 0.5-2.5 (3) MG/3ML IN SOLN
3.0000 mL | Freq: Four times a day (QID) | RESPIRATORY_TRACT | Status: DC
  Administered 2019-05-07 – 2019-05-08 (×5): 3 mL via RESPIRATORY_TRACT
  Filled 2019-05-06 (×6): qty 3

## 2019-05-06 MED ORDER — POLYETHYLENE GLYCOL 3350 17 G PO PACK: 17.0000 g | PACK | Freq: Every day | ORAL | Status: DC | PRN

## 2019-05-06 MED ORDER — HYDROCODONE-ACETAMINOPHEN 5-325 MG PO TABS: 1.0000 | ORAL_TABLET | Freq: Four times a day (QID) | ORAL | Status: DC | PRN

## 2019-05-06 MED ORDER — METHYLPREDNISOLONE SODIUM SUCC 40 MG IJ SOLR
40.0000 mg | Freq: Two times a day (BID) | INTRAMUSCULAR | Status: DC
  Administered 2019-05-07 – 2019-05-08 (×4): 40 mg via INTRAVENOUS
  Filled 2019-05-06 (×4): qty 1

## 2019-05-06 MED ORDER — IPRATROPIUM-ALBUTEROL 0.5-2.5 (3) MG/3ML IN SOLN: 3.0000 mL | Freq: Four times a day (QID) | RESPIRATORY_TRACT | Status: DC | PRN

## 2019-05-06 MED ORDER — METHYLPREDNISOLONE SODIUM SUCC 125 MG IJ SOLR
125.0000 mg | INTRAMUSCULAR | Status: AC
  Administered 2019-05-06: 125 mg via INTRAVENOUS
  Filled 2019-05-06: qty 2

## 2019-05-06 MED ORDER — PRAVASTATIN SODIUM 40 MG PO TABS
40.0000 mg | ORAL_TABLET | Freq: Every evening | ORAL | Status: DC
  Administered 2019-05-07 – 2019-05-08 (×2): 40 mg via ORAL
  Filled 2019-05-06 (×2): qty 1

## 2019-05-06 MED ORDER — ALBUTEROL SULFATE (2.5 MG/3ML) 0.083% IN NEBU
2.5000 mg | INHALATION_SOLUTION | Freq: Four times a day (QID) | RESPIRATORY_TRACT | Status: DC
  Administered 2019-05-06 – 2019-05-07 (×2): 2.5 mg via RESPIRATORY_TRACT
  Filled 2019-05-06: qty 3

## 2019-05-06 MED ORDER — FERROUS SULFATE 325 (65 FE) MG PO TABS
325.0000 mg | ORAL_TABLET | Freq: Two times a day (BID) | ORAL | Status: DC
  Administered 2019-05-07 – 2019-05-08 (×3): 325 mg via ORAL
  Filled 2019-05-06 (×3): qty 1

## 2019-05-06 MED ORDER — SODIUM CHLORIDE 0.9 % IV SOLN
500.0000 mg | Freq: Once | INTRAVENOUS | Status: AC
  Administered 2019-05-06: 500 mg via INTRAVENOUS
  Filled 2019-05-06: qty 500

## 2019-05-06 MED ORDER — PROSIGHT PO TABS
ORAL_TABLET | Freq: Every day | ORAL | Status: DC
  Administered 2019-05-07 – 2019-05-08 (×2): 1 via ORAL
  Filled 2019-05-06 (×2): qty 1

## 2019-05-06 MED ORDER — AMLODIPINE BESYLATE 10 MG PO TABS
10.0000 mg | ORAL_TABLET | Freq: Every day | ORAL | Status: DC
  Administered 2019-05-07 – 2019-05-08 (×2): 10 mg via ORAL
  Filled 2019-05-06 (×2): qty 1

## 2019-05-06 MED ORDER — FUROSEMIDE 10 MG/ML IJ SOLN
40.0000 mg | Freq: Once | INTRAMUSCULAR | Status: AC
  Administered 2019-05-06: 40 mg via INTRAVENOUS
  Filled 2019-05-06: qty 4

## 2019-05-06 MED ORDER — ASPIRIN EC 81 MG PO TBEC
81.0000 mg | DELAYED_RELEASE_TABLET | Freq: Every day | ORAL | Status: DC
  Administered 2019-05-07: 81 mg via ORAL
  Filled 2019-05-06: qty 1

## 2019-05-06 MED ORDER — INSULIN ASPART 100 UNIT/ML ~~LOC~~ SOLN
0.0000 [IU] | Freq: Every day | SUBCUTANEOUS | Status: DC
  Administered 2019-05-07: 3 [IU] via SUBCUTANEOUS
  Administered 2019-05-08: 5 [IU] via SUBCUTANEOUS

## 2019-05-06 MED ORDER — DOCUSATE SODIUM 100 MG PO CAPS
100.0000 mg | ORAL_CAPSULE | Freq: Two times a day (BID) | ORAL | Status: DC
  Administered 2019-05-07 – 2019-05-08 (×3): 100 mg via ORAL
  Filled 2019-05-06 (×3): qty 1

## 2019-05-06 MED ORDER — SERTRALINE HCL 100 MG PO TABS
100.0000 mg | ORAL_TABLET | Freq: Every morning | ORAL | Status: DC
  Administered 2019-05-07 – 2019-05-08 (×2): 100 mg via ORAL
  Filled 2019-05-06 (×2): qty 1

## 2019-05-06 MED ORDER — INSULIN ASPART 100 UNIT/ML ~~LOC~~ SOLN
0.0000 [IU] | Freq: Three times a day (TID) | SUBCUTANEOUS | Status: DC
  Administered 2019-05-07: 9 [IU] via SUBCUTANEOUS
  Administered 2019-05-07: 2 [IU] via SUBCUTANEOUS
  Administered 2019-05-07: 1 [IU] via SUBCUTANEOUS
  Administered 2019-05-08: 06:00:00 5 [IU] via SUBCUTANEOUS
  Administered 2019-05-08: 17:00:00 3 [IU] via SUBCUTANEOUS
  Administered 2019-05-08: 7 [IU] via SUBCUTANEOUS

## 2019-05-06 MED ORDER — ENOXAPARIN SODIUM 30 MG/0.3ML ~~LOC~~ SOLN
30.0000 mg | SUBCUTANEOUS | Status: DC
  Administered 2019-05-07 – 2019-05-08 (×2): 30 mg via SUBCUTANEOUS
  Filled 2019-05-06 (×2): qty 0.3

## 2019-05-06 MED ORDER — GLUCERNA SHAKE PO LIQD: 237.0000 mL | ORAL | Status: DC | PRN

## 2019-05-06 MED ORDER — ALBUTEROL SULFATE HFA 108 (90 BASE) MCG/ACT IN AERS: 8.0000 | INHALATION_SPRAY | Freq: Once | RESPIRATORY_TRACT | Status: DC

## 2019-05-06 MED ORDER — CEFDINIR 300 MG PO CAPS
300.0000 mg | ORAL_CAPSULE | Freq: Every day | ORAL | Status: DC
  Filled 2019-05-06: qty 1

## 2019-05-06 MED ORDER — ALBUTEROL SULFATE (2.5 MG/3ML) 0.083% IN NEBU
INHALATION_SOLUTION | RESPIRATORY_TRACT | Status: AC
  Filled 2019-05-06: qty 3

## 2019-05-06 NOTE — ED Triage Notes (Signed)
Pt arrives to ED from home with complaints of worsening  shortness of breath for the last two days. Pt states she feels like she cannot breathe. Pts oxygen is at 87% in triage. Pt on 3L at home.

## 2019-05-06 NOTE — ED Provider Notes (Signed)
Patient signed out to me with concern for COPD exacerbation versus possible pneumonia versus possible volume overload.  Chronically on 3 L of oxygen.  Patient given a Zithromax, albuterol.  Chest x-ray overall unremarkable.  Negative for coronavirus.  Troponin mildly elevated.  BNP mildly elevated.  However patient does not appear floridly volume overloaded on exam.  Has wheezing.  More concern for COPD exacerbation.  Patient on 5 L of oxygen.  Tachypneic.  Will place on BiPAP.  Will give further albuterol treatment.  To be admitted for likely COPD exacerbation with worsening hypoxia.  Has felt better following albuterol treatments.  Coronavirus test was negative and will place on BiPAP.  Does not have any chest pain.  EKG reassuring.  This chart was dictated using voice recognition software.  Despite best efforts to proofread,  errors can occur which can change the documentation meaning.    .Critical Care Performed by: Lennice Sites, DO Authorized by: Lennice Sites, DO   Critical care provider statement:    Critical care time (minutes):  45   Critical care was necessary to treat or prevent imminent or life-threatening deterioration of the following conditions:  Respiratory failure   Critical care was time spent personally by me on the following activities:  Blood draw for specimens, development of treatment plan with patient or surrogate, discussions with primary provider, evaluation of patient's response to treatment, obtaining history from patient or surrogate, ordering and review of laboratory studies, ordering and performing treatments and interventions, ordering and review of radiographic studies, pulse oximetry, re-evaluation of patient's condition and review of old charts   I assumed direction of critical care for this patient from another provider in my specialty: no        Lennice Sites, DO 05/04/2019 2020

## 2019-05-06 NOTE — H&P (Signed)
History and Physical  [name] [facility]:[id number] DOB: [birth year] DOA: 05/04/2019  Referring physician: ER provider PCP: Harlan Stains, MD  Outpatient Specialists:    Patient coming from: Home  Chief Complaint: Shortness of breath for 2 days.  HPI:  Patient is an 85 year old Caucasian female past medical history significant for non-small cell lung cancer, hypertension, hyperlipidemia, documented COPD on 3 L of oxygen via nasal cannula per minute, CKD, diabetes mellitus, aortic stenosis, grade 1 diastolic dysfunction amongst other medical and cardiac problems.  Patient presents with 2 to 3-day history of worsening shortness of breath despite increased use of inhalers.  On presentation to the hospital, the patient was in significant respiratory distress that the ER provider had to put patient on BiPAP.  However, ABG revealed pH of 7.43, PCO2 of 30 and O2 sat of 92.  Cardiac BNP is minimally elevated.  Chest x-ray is said to reveal stable bibasilar subsegmental atelectasis or scarring, and patient is status post aortic valve repair.  No headache, no neck pain, no chest pain, no fever or chills, no productive cough, no GI symptoms and no urinary symptoms.  According to the patient, she smokes cigarettes for 20 years but quit in 1970s.  ED Course: Patient presented in significant respiratory distress and is currently on BiPAP.  Patient looks more comfortable.  Patient is afebrile, heart rate is 97 bpm, respiratory rate of 32, blood pressure 169/80 mmHg and O2 sat of 98%.  ABG is as documented above.  WBC is 9, CO2 is actually decreased at 20.  BUN of 26 and serum creatinine of 1.01.  Pertinent labs: Chemistry reveals sodium of 136, potassium of 4.3, chloride 100, CO2 of 20, BUN of 26, creatinine of 1.01, blood sugar of 190.  Albumin is 3.8, ALT of 62, AST of 39.  ABGs as documented above.  Cardiac BNP is 476.1, initial high-sensitive troponin of 13 with repeat being 20.  Revealed WBC of 9, hemoglobin  of 12.9, hematocrit of 41.7, platelet count of 245.  Was protein is greater than 300 and specific gravity of 1.022.  EKG: Independently reviewed.   Imaging: independently reviewed.   Review of Systems:  Negative for fever, visual changes, sore throat, rash, new muscle aches, chest pain, dysuria, bleeding, n/v/abdominal pain.  Past Medical History:  Diagnosis Date   Adenomatous colon polyp 1992   Anxiety    Aortic stenosis    Arthritis    Asthma    uses inhaler as needed   CKD (chronic kidney disease)    COPD (chronic obstructive pulmonary disease) ([place])    Depression    Diabetes mellitus    Diverticulosis    Emphysema of lung ([facility])    GERD (gastroesophageal reflux disease)    Glaucoma    Hiatal hernia    Hyperlipidemia    Hypertension    [name]    Murmur    Nephrolithiasis    Non-small cell lung cancer ([place])    Osteoporosis    Pneumonia 3/17 and 3/18    Past Surgical History:  Procedure Laterality Date   ABDOMINAL HYSTERECTOMY     ANKLE SURGERY  1998   d/t fx.  Left ankle   ANTERIOR AND POSTERIOR REPAIR  06/21/2011   Procedure: ANTERIOR (CYSTOCELE) AND POSTERIOR REPAIR (RECTOCELE);  Surgeon: Maisie Fus;  Location: [facility];  Service: Gynecology;  Laterality: N/A;   AORTIC VALVE REPLACEMENT N/A 07/17/2013   Procedure: AORTIC VALVE REPLACEMENT (AVR);  Surgeon: Ivin Poot, MD;  Location: [facility];  Service: Open Heart Surgery;  Laterality: N/A;   CARDIAC CATHETERIZATION     CARDIAC VALVE REPLACEMENT     CATARACT EXTRACTION     CORONARY ANGIOPLASTY     CORONARY ARTERY BYPASS GRAFT N/A 07/17/2013   Procedure: CORONARY ARTERY BYPASS GRAFT, TIMES ONE, ON PUMP, USING RIGHT INTERNAL MAMMARY ARTERY.;  Surgeon: Ivin Poot, MD;  Location: [facility];  Service: Open Heart Surgery;  Laterality: N/A;   FACIAL COSMETIC SURGERY     face lift   INTRAOPERATIVE TRANSESOPHAGEAL ECHOCARDIOGRAM N/A 07/17/2013   Procedure:  INTRAOPERATIVE TRANSESOPHAGEAL ECHOCARDIOGRAM;  Surgeon: Ivin Poot, MD;  Location: [facility];  Service: Open Heart Surgery;  Laterality: N/A;   LAPAROSCOPIC ASSISTED VAGINAL HYSTERECTOMY  06/21/2011   Procedure: LAPAROSCOPIC ASSISTED VAGINAL HYSTERECTOMY;  Surgeon: Maisie Fus;  Location: [facility];  Service: Gynecology;  Laterality: N/A;   LEFT AND RIGHT HEART CATHETERIZATION WITH CORONARY ANGIOGRAM N/A 06/27/2013   Procedure: LEFT AND RIGHT HEART CATHETERIZATION WITH CORONARY ANGIOGRAM;  Surgeon: Ramond Dial, MD;  Location: [facility] C[facility] LAB;  Service: Cardiovascular;  Laterality: N/A;   NOSE SURGERY     SALPINGOOPHORECTOMY  06/21/2011   Procedure: SALPINGO OOPHERECTOMY;  Surgeon: Maisie Fus;  Location: [facility];  Service: Gynecology;  Laterality: Bilateral;   VAGINAL PROLAPSE REPAIR  06/21/2011   Procedure: SACROPEXY;  Surgeon: Maisie Fus;  Location: [facility];  Service: Gynecology;  Laterality: N/A;  sacrospinous ligament suspension     reports that she quit smoking about 46 years ago. Her smoking use included cigarettes. She has a 30.00 pack-year smoking history. She has never used smokeless tobacco. She reports that she does not drink alcohol or use drugs.  Allergies  Allergen Reactions   Lipitor [Atorvastatin] Other (See Comments)    myalgias myalgias   Lisinopril Other (See Comments)    cough cough   Amaryl [Glimepiride]     Side effects   Anoro Ellipta [Umeclidinium-Vilanterol]     Increase BP   Avelox [Moxifloxacin Hcl In Nacl]     nausea   Ciprofloxacin     nausea   Other Other (See Comments)    Increased cough   Prevacid [Lansoprazole]     diarrhea   Spiriva Handihaler [Tiotropium Bromide Monohydrate]     Increased cough   Symbicort [Budesonide-Formoterol Fumarate] Cough    Increased cough    Family History  Problem Relation Age of Onset   Emphysema Father    Colon cancer Neg Hx    Esophageal cancer Neg Hx    Rectal cancer Neg Hx     Stomach cancer Neg Hx      Prior to Admission medications   Medication Sig Start Date End Date Taking? Authorizing Provider  acetaminophen (TYLENOL) 500 MG tablet Take 500 mg by mouth every 6 (six) hours as needed for headache.   Yes [provider]  albuterol (PROVENTIL) (2.5 MG/3ML) 0.083% nebulizer solution Take 3 mLs (2.5 mg total) by nebulization every 6 (six) hours as needed for wheezing. 11/23/16  Yes Eugenie Filler, MD  alendronate (FOSAMAX) 70 MG tablet Take 70 mg by mouth once a week. Take with a full glass of water on an empty stomach.  Every Thursday   Yes [provider]  amLODipine (NORVASC) 10 MG tablet Take 1 tablet (10 mg total) by mouth daily. 10/19/15  Yes Crenshaw, Denice Bors, MD  ANORO ELLIPTA 62.5-25 MCG/INH AEPB USE 1 INHALATION BY MOUTH  DAILY Patient taking differently:  Inhale 1 puff into the lungs daily.  03/27/18  Yes Brand Males, MD  aspirin EC 81 MG tablet Take 81 mg by mouth at bedtime.   Yes [provider]  buPROPion (WELLBUTRIN XL) 150 MG 24 hr tablet Take 150 mg by mouth daily. 06/04/17  Yes [provider]  cholecalciferol (VITAMIN D) 1000 units tablet Take 1,000 Units by mouth at bedtime.    Yes [provider]  Dextromethorphan-guaiFENesin (ROBITUSSIN DM PO) Take 5 mLs by mouth at bedtime.    Yes [provider]  Docusate Sodium (COLACE PO) Take 100 mg by mouth 2 (two) times daily.    Yes [provider]  feeding supplement, GLUCERNA SHAKE, (GLUCERNA SHAKE) LIQD Take 237 mLs by mouth 3 (three) times daily between meals. Patient taking differently: Take 237 mLs by mouth as needed (supplement).  09/20/18  Yes Lavina Hamman, MD  Ferrous Sulfate (IRON) 325 (65 Fe) MG TABS Take 325 mg by mouth 2 (two) times daily.    Yes [provider]  fluticasone (FLOVENT HFA) 220 MCG/ACT inhaler Inhale 2 puffs into the lungs 2 (two) times daily.   Yes [provider]  guaiFENesin (MUCINEX) 600  MG 12 hr tablet Take 1,200 mg by mouth 2 (two) times daily.   Yes [provider]  HYDROcodone-acetaminophen (NORCO/VICODIN) 5-325 MG tablet Take 1 tablet by mouth every 6 (six) hours as needed. for pain 03/28/19  Yes [provider]  irbesartan (AVAPRO) 300 MG tablet Take 300 mg by mouth daily.  10/05/18  Yes [provider]  Magnesium 250 MG TABS Take 250 mg by mouth at bedtime.    Yes [provider]  metFORMIN (GLUCOPHAGE-XR) 500 MG 24 hr tablet Take 500 mg by mouth every evening. With Dinner 09/30/18  Yes [provider]  Multiple Vitamins-Minerals ([place] PO) Take 1 capsule by mouth daily.    Yes [provider]  polyethylene glycol (MIRALAX / GLYCOLAX) 17 g packet Take 17 g by mouth daily as needed for mild constipation. 03/24/19  Yes Aline August, MD  pravastatin (PRAVACHOL) 40 MG tablet Take 40 mg by mouth every evening.    Yes [provider]  PROAIR HFA 108 (90 BASE) MCG/ACT inhaler Inhale 2 puffs into the lungs 4 (four) times daily as needed for wheezing or shortness of breath.  02/24/14  Yes [provider]  sertraline (ZOLOFT) 100 MG tablet Take 1 tablet (100 mg total) by mouth every morning. 11/24/16  Yes Eugenie Filler, MD  traMADol (ULTRAM) 50 MG tablet Take 1 tablet (50 mg total) by mouth every 6 (six) hours as needed. Patient not taking: Reported on 05/17/2019 03/24/19   Aline August, MD    Physical Exam: Vitals:   04/20/2019 1952 04/26/2019 2000 04/23/2019 2030 05/14/2019 2149  BP: (!) 184/80 (!) 182/78 (!) 181/85 (!) 169/81  Pulse: 86 91 (!) 103 97  Resp: (!) 28 (!) 28 (!) 31 (!) 32  Temp:    98 F (36.7 C)  TempSrc:    Oral  SpO2: 97% 97% 98% 98%  Weight:    34.2 kg  Height:    5' (1.524 m)    Constitutional:   Appears calm and comfortable.  Patient is cachectic.  Patient is comfortable on BiPAP Eyes:   No pallor. No jaundice.  ENMT:   external ears, nose appear normal Neck:    Neck is supple.  JVD is difficult to assess.   Respiratory:   Diminished  air entry globally with an expiratory wheeze Cardiovascular:   S1S2  No LE extremity edema   Abdomen:   Abdomen is soft and non tender. Organs are difficult to assess. Neurologic:   Awake and alert.  Moves all limbs.  Wt Readings from Last 3 Encounters:  05/17/2019 34.2 kg  05/05/19 37.8 kg  04/11/19 36.1 kg    I have personally reviewed following labs and imaging studies  Labs on Admission:  CBC: Recent Labs  Lab 04/22/2019 1434 05/05/2019 2000  WBC 9.0  --   NEUTROABS 6.3  --   HGB 12.9 13.3  HCT 41.7 39.0  MCV 95.0  --   PLT 245  --    Basic Metabolic Panel: Recent Labs  Lab 04/19/2019 1434 05/14/2019 2000  NA 136 137  K 4.3 4.6  CL 100  --   CO2 20*  --   GLUCOSE 190*  --   BUN 26*  --   CREATININE 1.01*  --   CALCIUM 9.7  --    Liver Function Tests: Recent Labs  Lab 04/23/2019 1434  AST 39  ALT 62*  ALKPHOS 82  BILITOT 0.8  PROT 7.6  ALBUMIN 3.8   No results for input(s): LIPASE, AMYLASE in the last 168 hours. No results for input(s): AMMONIA in the last 168 hours. Coagulation Profile: No results for input(s): INR, PROTIME in the last 168 hours. Cardiac Enzymes: No results for input(s): CKTOTAL, CKMB, CKMBINDEX, TROPONINI in the last 168 hours. BNP (last 3 results) No results for input(s): PROBNP in the last 8760 hours. HbA1C: No results for input(s): HGBA1C in the last 72 hours. CBG: No results for input(s): GLUCAP in the last 168 hours. Lipid Profile: No results for input(s): CHOL, HDL, LDLCALC, TRIG, CHOLHDL, LDLDIRECT in the last 72 hours. Thyroid Function Tests: No results for input(s): TSH, T4TOTAL, FREET4, T3FREE, THYROIDAB in the last 72 hours. Anemia Panel: No results for input(s): VITAMINB12, FOLATE, FERRITIN, TIBC, IRON, RETICCTPCT in the last 72 hours. Urine analysis:    Component Value Date/Time   COLORURINE YELLOW 04/26/2019 2104   APPEARANCEUR  CLEAR 05/04/2019 2104   LABSPEC 1.022 05/05/2019 2104   PHURINE 6.0 05/05/2019 2104   GLUCOSEU NEGATIVE 05/05/2019 2104   HGBUR NEGATIVE 05/17/2019 2104   BILIRUBINUR NEGATIVE 05/02/2019 2104   KETONESUR 5 (A) 05/16/2019 2104   PROTEINUR >=300 (A) 05/03/2019 2104   UROBILINOGEN 0.2 07/11/2013 1416   NITRITE NEGATIVE 05/10/2019 2104   LEUKOCYTESUR NEGATIVE 04/19/2019 2104   Sepsis Labs: @LABRCNTIP (procalcitonin:4,lacticidven:4) ) Recent Results (from the past 240 hour(s))  SARS Coronavirus 2 [facility] order, Performed in [facility] hospital lab) Nasopharyngeal Nasopharyngeal Swab     Status: None   Collection Time: 05/02/2019  3:09 PM   Specimen: Nasopharyngeal Swab  Result Value Ref Range Status   SARS Coronavirus 2 NEGATIVE NEGATIVE Final    Comment: (NOTE) If result is NEGATIVE SARS-CoV-2 target nucleic acids are NOT DETECTED. The SARS-CoV-2 RNA is generally detectable in upper and lower  respiratory specimens during the acute phase of infection. The lowest  concentration of SARS-CoV-2 viral copies this assay can detect is 250  copies / mL. A negative result does not preclude SARS-CoV-2 infection  and should not be used as the sole basis for treatment or other  patient management decisions.  A negative result may occur with  improper specimen collection / handling, submission of specimen other  than nasopharyngeal swab, presence of viral mutation(s) within the  areas targeted by this assay, and  inadequate number of viral copies  (<250 copies / mL). A negative result must be combined with clinical  observations, patient history, and epidemiological information. If result is POSITIVE SARS-CoV-2 target nucleic acids are DETECTED. The SARS-CoV-2 RNA is generally detectable in upper and lower  respiratory specimens dur ing the acute phase of infection.  Positive  results are indicative of active infection with SARS-CoV-2.  Clinical  correlation with patient history and other  diagnostic information is  necessary to determine patient infection status.  Positive results do  not rule out bacterial infection or co-infection with other viruses. If result is PRESUMPTIVE POSTIVE SARS-CoV-2 nucleic acids MAY BE PRESENT.   A presumptive positive result was obtained on the submitted specimen  and confirmed on repeat testing.  While 2019 novel coronavirus  (SARS-CoV-2) nucleic acids may be present in the submitted sample  additional confirmatory testing may be necessary for epidemiological  and / or clinical management purposes  to differentiate between  SARS-CoV-2 and other Sarbecovirus currently known to infect humans.  If clinically indicated additional testing with an alternate test  methodology [phone]) is advised. The SARS-CoV-2 RNA is generally  detectable in upper and lower respiratory sp ecimens during the acute  phase of infection. The expected result is Negative. Fact Sheet for Patients:  StrictlyIdeas.no Fact Sheet for Healthcare Providers: BankingDealers.co.za This test is not yet approved or cleared by the [place] FDA and has been authorized for detection and/or diagnosis of SARS-CoV-2 by FDA under an Emergency Use Authorization (EUA).  This EUA will remain in effect (meaning this test can be used) for the duration of the COVID-19 declaration under Section 564(b)(1) of the Act, 21 U.S.C. section 360bbb-3(b)(1), unless the authorization is terminated or revoked sooner. Performed at [facility], [place] [address] [place], [place] [address]       Radiological Exams on Admission: Dg Chest Port 1 View  Result Date: 05/16/2019 CLINICAL DATA:  Shortness of breath. EXAM: PORTABLE CHEST 1 VIEW COMPARISON:  Radiograph of April 11, 2019. FINDINGS: Stable cardiomediastinal silhouette. Atherosclerosis of thoracic aorta is noted. Status post aortic valve repair. Severe emphysematous disease is noted in both  lungs. Stable bibasilar atelectasis or scarring is noted. No definite pleural effusions are noted. Bony thorax is unremarkable. IMPRESSION: Stable bibasilar subsegmental atelectasis or scarring is noted. Status post aortic valve repair. Aortic Atherosclerosis (ICD10-I70.0) and Emphysema (ICD10-J43.9). Electronically Signed   By: Marijo Conception M.D.   On: 04/27/2019 14:50    EKG: Independently reviewed.   Active Problems:   SOB (shortness of breath)   Assessment/Plan Worsening shortness of breath: -Possibilities include acute on chronic COPD with exacerbation/worsening respiratory failure -Possible CHF with exacerbation -Rule out other possibilities (acid-base status may be interpreted as follows: Mild anion gap metabolic acidosis with respiratory alkalosis) -We will still treat for possible COPD exacerbation -Continue BiPAP -We will treat with Solu-Medrol and nebulizer treatment -Trial of 1 dose of IV Lasix -Continue to assess -Threshold to consult pulmonary team -Management depend on hospital course  Chronic respiratory failure with recent worsening: See above.  Possible acute on chronic COPD: See above.  Elevated cardiac BNP: Rule out underlying CHF.  Rule out underlying pulmonary fibrosis: Chest x-ray is noted. Low threshold to consult pulmonary  CKD 3: Stable.  Diabetes mellitus: Sliding scale insulin coverage.  DVT prophylaxis: Subacute Lovenox Code Status: Full code Family Communication:  Disposition Plan: Home eventually Consults called: Low threshold to consult pulmonary Admission status: Inpatient  Time spent: 65 minutes  Dana Allan, MD  [facility] Pager #: [phone] 7PM-7AM contact night coverage as above  05/13/2019, 11:14 PM

## 2019-05-06 NOTE — Progress Notes (Signed)
Patient transported from ED Trauma A to 2C06 on BIPAP with no complications. Report given to unit RT.

## 2019-05-06 NOTE — Telephone Encounter (Signed)
BS cancelled with peggy in radiology

## 2019-05-06 NOTE — ED Provider Notes (Signed)
[facility] EMERGENCY DEPARTMENT Provider Note   CSN: [id number] Arrival date & time: 05/15/2019  1344     History   Chief Complaint Chief Complaint  Patient presents with  . Shortness of Breath    HPI [name] is a 85 y.o. female.     HPI Patient with multiple medical problems including CKD, COPD, aortic stenosis, now status post aortic valve replacement presents with dyspnea. Patient denies pain, fever She does have ongoing cough, but that has been present for several months. Over the past 2 days, in spite of using her home oxygen she has had dyspnea, both at rest and with exertion. No exertional pain, no syncope, no nausea, vomiting, fever. She has not increased her oxygen level. She is here with her husband who assists with the HPI. Past Medical History:  Diagnosis Date  . Adenomatous colon polyp 1992  . Anxiety   . Aortic stenosis   . Arthritis   . Asthma    uses inhaler as needed  . CKD (chronic kidney disease)   . COPD (chronic obstructive pulmonary disease) ([place])   . Depression   . Diabetes mellitus   . Diverticulosis   . Emphysema of lung ([place])   . GERD (gastroesophageal reflux disease)   . Glaucoma   . Hiatal hernia   . Hyperlipidemia   . Hypertension   . [name]   . Murmur   . Nephrolithiasis   . Non-small cell lung cancer ([place])   . Osteoporosis   . Pneumonia 3/17 and 3/18    Patient Active Problem List   Diagnosis Date Noted  . Community acquired pneumonia, bilateral 04/11/2019  . Sepsis due to pneumonia ([place]) 03/21/2019  . Hypertensive urgency 03/21/2019  . Non-small cell lung cancer ([place]) 03/21/2019  . Leukocytosis 03/21/2019  . Pain due to onychomycosis of toenails of both feet 03/14/2019  . Diabetes mellitus without complication ([place]) 63/84/5364  . Chest tube in place   . Acute pneumothorax   . Pneumothorax on left   . Protein-calorie malnutrition, severe 09/06/2018  . Community acquired  pneumonia of left lower lobe of lung ([place]) 09/05/2018  . Pulmonary nodule 12/03/2017  . Iron deficiency anemia   . Heme + stool   . CAD (coronary artery disease) 10/27/2017  . Microcytic anemia 10/27/2017  . Anemia due to GI blood loss 10/27/2017  . COPD with acute exacerbation ([place]) 10/27/2017  . Pulmonary nodule, left 10/15/2017  . Anemia, chronic disease 12/07/2016  . Chronic obstructive pulmonary disease ([place])   . Acute on chronic respiratory failure with hypoxia ([place]) 11/19/2016  . CKD (chronic kidney disease), stage III ([place]) 11/19/2016  . Diabetes mellitus type 2, uncontrolled ([place]) 11/19/2016  . Recurrent pneumonia 11/19/2016  . Chronic diastolic CHF (congestive heart failure) ([place]) 11/19/2016  . Pulmonary HTN ([place]) 11/19/2016  . HLD (hyperlipidemia) 11/19/2016  . Hiatal hernia 05/01/2016  . History of bacterial pneumonia 05/01/2016  . COPD exacerbation ([place]) 11/18/2015  . Physical deconditioning 07/24/2013  . S/P AVR (aortic valve replacement) 07/23/2013  . S/P CABG x 1 07/23/2013  . Aortic stenosis 06/10/2013  . Essential hypertension 06/10/2013  . COPD GOLD II  10/26/2011    Past Surgical History:  Procedure Laterality Date  . ABDOMINAL HYSTERECTOMY    . ANKLE SURGERY  1998   d/t fx.  Left ankle  . ANTERIOR AND POSTERIOR REPAIR  06/21/2011   Procedure: ANTERIOR (CYSTOCELE) AND POSTERIOR REPAIR (RECTOCELE);  Surgeon: Maisie Fus;  Location: [facility];  Service: Gynecology;  Laterality: N/A;  . AORTIC VALVE REPLACEMENT N/A 07/17/2013   Procedure: AORTIC VALVE REPLACEMENT (AVR);  Surgeon: Ivin Poot, MD;  Location: [facility];  Service: Open Heart Surgery;  Laterality: N/A;  . CARDIAC CATHETERIZATION    . CARDIAC VALVE REPLACEMENT    . CATARACT EXTRACTION    . CORONARY ANGIOPLASTY    . CORONARY ARTERY BYPASS GRAFT N/A 07/17/2013   Procedure: CORONARY ARTERY BYPASS GRAFT, TIMES ONE, ON PUMP, USING RIGHT INTERNAL MAMMARY ARTERY.;  Surgeon: Ivin Poot, MD;   Location: [facility];  Service: Open Heart Surgery;  Laterality: N/A;  . FACIAL COSMETIC SURGERY     face lift  . INTRAOPERATIVE TRANSESOPHAGEAL ECHOCARDIOGRAM N/A 07/17/2013   Procedure: INTRAOPERATIVE TRANSESOPHAGEAL ECHOCARDIOGRAM;  Surgeon: Ivin Poot, MD;  Location: [facility];  Service: Open Heart Surgery;  Laterality: N/A;  . LAPAROSCOPIC ASSISTED VAGINAL HYSTERECTOMY  06/21/2011   Procedure: LAPAROSCOPIC ASSISTED VAGINAL HYSTERECTOMY;  Surgeon: Maisie Fus;  Location: [facility];  Service: Gynecology;  Laterality: N/A;  . LEFT AND RIGHT HEART CATHETERIZATION WITH CORONARY ANGIOGRAM N/A 06/27/2013   Procedure: LEFT AND RIGHT HEART CATHETERIZATION WITH CORONARY ANGIOGRAM;  Surgeon: Ramond Dial, MD;  Location: [facility];  Service: Cardiovascular;  Laterality: N/A;  . NOSE SURGERY    . SALPINGOOPHORECTOMY  06/21/2011   Procedure: SALPINGO OOPHERECTOMY;  Surgeon: Maisie Fus;  Location: [facility];  Service: Gynecology;  Laterality: Bilateral;  . VAGINAL PROLAPSE REPAIR  06/21/2011   Procedure: SACROPEXY;  Surgeon: Maisie Fus;  Location: [facility];  Service: Gynecology;  Laterality: N/A;  sacrospinous ligament suspension     OB History   No obstetric history on file.      Home Medications    Prior to Admission medications   Medication Sig Start Date End Date Taking? Authorizing Provider  acetaminophen (TYLENOL) 500 MG tablet Take 500 mg by mouth every 6 (six) hours as needed for headache.   Yes [provider]  albuterol (PROVENTIL) (2.5 MG/3ML) 0.083% nebulizer solution Take 3 mLs (2.5 mg total) by nebulization every 6 (six) hours as needed for wheezing. 11/23/16  Yes Eugenie Filler, MD  alendronate (FOSAMAX) 70 MG tablet Take 70 mg by mouth once a week. Take with a full glass of water on an empty stomach.  Every Thursday   Yes [provider]  amLODipine (NORVASC) 10 MG tablet Take 1 tablet (10 mg total) by mouth daily. 10/19/15  Yes Crenshaw, Denice Bors, MD  ANORO  ELLIPTA 62.5-25 MCG/INH AEPB USE 1 INHALATION BY MOUTH  DAILY Patient taking differently: Inhale 1 puff into the lungs daily.  03/27/18  Yes Brand Males, MD  aspirin EC 81 MG tablet Take 81 mg by mouth at bedtime.   Yes [provider]  buPROPion (WELLBUTRIN XL) 150 MG 24 hr tablet Take 150 mg by mouth daily. 06/04/17  Yes [provider]  cholecalciferol (VITAMIN D) 1000 units tablet Take 1,000 Units by mouth at bedtime.    Yes [provider]  cloNIDine (CATAPRES) 0.1 MG tablet Take 0.1 mg by mouth. 1/2 daily   Yes [provider]  Dextromethorphan-guaiFENesin (ROBITUSSIN DM PO) Take 5 mLs by mouth at bedtime.    Yes [provider]  Docusate Sodium (COLACE PO) Take 100 mg by mouth 2 (two) times daily.    Yes [provider]  feeding supplement, GLUCERNA SHAKE, (GLUCERNA SHAKE) LIQD Take 237 mLs by mouth 3 (three) times  daily between meals. Patient taking differently: Take 237 mLs by mouth as needed (supplement).  09/20/18  Yes Lavina Hamman, MD  Ferrous Sulfate (IRON) 325 (65 Fe) MG TABS Take 325 mg by mouth 2 (two) times daily.    Yes [provider]  fluticasone (FLOVENT HFA) 220 MCG/ACT inhaler Inhale 2 puffs into the lungs 2 (two) times daily.   Yes [provider]  guaiFENesin (MUCINEX) 600 MG 12 hr tablet Take 1,200 mg by mouth 2 (two) times daily.   Yes [provider]  HYDROcodone-acetaminophen (NORCO/VICODIN) 5-325 MG tablet Take 1 tablet by mouth every 6 (six) hours as needed. for pain 03/28/19  Yes [provider]  irbesartan (AVAPRO) 300 MG tablet Take 300 mg by mouth daily.  10/05/18  Yes [provider]  Magnesium 250 MG TABS Take 250 mg by mouth at bedtime.    Yes [provider]  metFORMIN (GLUCOPHAGE-XR) 500 MG 24 hr tablet Take 500 mg by mouth every evening. With Dinner 09/30/18  Yes [provider]  Multiple Vitamins-Minerals ([place] PO) Take  1 capsule by mouth daily.    Yes [provider]  polyethylene glycol (MIRALAX / GLYCOLAX) 17 g packet Take 17 g by mouth daily as needed for mild constipation. 03/24/19  Yes Aline August, MD  pravastatin (PRAVACHOL) 40 MG tablet Take 40 mg by mouth every evening.    Yes [provider]  PROAIR HFA 108 (90 BASE) MCG/ACT inhaler Inhale 2 puffs into the lungs 4 (four) times daily as needed for wheezing or shortness of breath.  02/24/14  Yes [provider]  sertraline (ZOLOFT) 100 MG tablet Take 1 tablet (100 mg total) by mouth every morning. 11/24/16  Yes Eugenie Filler, MD  traMADol (ULTRAM) 50 MG tablet Take 1 tablet (50 mg total) by mouth every 6 (six) hours as needed. Patient not taking: Reported on 05/02/2019 03/24/19   Aline August, MD    Family History Family History  Problem Relation Age of Onset  . Emphysema Father   . Colon cancer Neg Hx   . Esophageal cancer Neg Hx   . Rectal cancer Neg Hx   . Stomach cancer Neg Hx     Social History Social History   Tobacco Use  . Smoking status: Former Smoker    Packs/day: 2.00    Years: 15.00    Pack years: 30.00    Types: Cigarettes    Quit date: 09/18/1972    Years since quitting: 46.6  . Smokeless tobacco: Never Used  Substance Use Topics  . Alcohol use: No    Alcohol/week: 0.0 standard drinks  . Drug use: No     Allergies   Lipitor [atorvastatin], Lisinopril, Amaryl [glimepiride], Anoro ellipta [umeclidinium-vilanterol], Avelox [moxifloxacin hcl in nacl], Ciprofloxacin, Other, Prevacid [lansoprazole], Spiriva handihaler [tiotropium bromide monohydrate], and Symbicort [budesonide-formoterol fumarate]   Review of Systems Review of Systems  Constitutional:       Per HPI, otherwise negative  HENT:       Per HPI, otherwise negative  Respiratory:       Per HPI, otherwise negative  Cardiovascular:       Per HPI, otherwise negative  Gastrointestinal: Negative for vomiting.  Endocrine:        Negative aside from HPI  Genitourinary:       Neg aside from HPI   Musculoskeletal:       Per HPI, otherwise negative  Skin: Negative.   Neurological: Positive for weakness.  Negative for syncope.     Physical Exam Updated Vital Signs BP (!) 185/84 (BP Location: Left Arm)   Pulse 98   Temp 97.8 F (36.6 C) (Oral)   Resp (!) 24   SpO2 95%   Physical Exam Vitals signs and nursing note reviewed.  Constitutional:      Appearance: She is ill-appearing.     Comments: Mechele Claude elderly female awake and alert nasal cannula in place.  HENT:     Head: Normocephalic and atraumatic.  Eyes:     Conjunctiva/sclera: Conjunctivae normal.  Cardiovascular:     Rate and Rhythm: Regular rhythm. Tachycardia present.     Heart sounds: Murmur present.  Pulmonary:     Effort: Tachypnea present.     Breath sounds: Decreased breath sounds present. No wheezing.  Abdominal:     General: There is no distension.     Palpations: Abdomen is soft.     Tenderness: There is no abdominal tenderness.  Skin:    General: Skin is warm and dry.  Neurological:     Mental Status: She is alert and oriented to person, place, and time.     Cranial Nerves: No cranial nerve deficit.      ED Treatments / Results  Labs (all labs ordered are listed, but only abnormal results are displayed) Labs Reviewed  COMPREHENSIVE METABOLIC PANEL - Abnormal; Notable for the following components:      Result Value   CO2 20 (*)    Glucose, Bld 190 (*)    BUN 26 (*)    Creatinine, Ser 1.01 (*)    ALT 62 (*)    GFR calc non Af Amer 51 (*)    GFR calc Af Amer 59 (*)    Anion gap 16 (*)    All other components within normal limits  CBC WITH DIFFERENTIAL/PLATELET - Abnormal; Notable for the following components:   Monocytes Absolute 1.4 (*)    All other components within normal limits  SARS CORONAVIRUS 2 (HOSPITAL ORDER, [facility])  BRAIN NATRIURETIC PEPTIDE  URINALYSIS, ROUTINE W REFLEX  MICROSCOPIC  TROPONIN I (HIGH SENSITIVITY)    EKG EKG Interpretation  Date/Time:  Tuesday May 06 2019 14:10:12 EDT Ventricular Rate:  100 PR Interval:    QRS Duration: 137 QT Interval:  387 QTC Calculation: 500 R Axis:   -84 Text Interpretation:  Sinus tachycardia RBBB and LAFB similar to prior ST-t wave abnormality Abnormal ECG Confirmed by Carmin Muskrat [id number]) on 05/10/2019 2:12:38 PM   Radiology Dg Chest Port 1 View  Result Date: 05/01/2019 CLINICAL DATA:  Shortness of breath. EXAM: PORTABLE CHEST 1 VIEW COMPARISON:  Radiograph of April 11, 2019. FINDINGS: Stable cardiomediastinal silhouette. Atherosclerosis of thoracic aorta is noted. Status post aortic valve repair. Severe emphysematous disease is noted in both lungs. Stable bibasilar atelectasis or scarring is noted. No definite pleural effusions are noted. Bony thorax is unremarkable. IMPRESSION: Stable bibasilar subsegmental atelectasis or scarring is noted. Status post aortic valve repair. Aortic Atherosclerosis (ICD10-I70.0) and Emphysema (ICD10-J43.9). Electronically Signed   By: Marijo Conception M.D.   On: 04/30/2019 14:50    Procedures Procedures (including critical care time)  Medications Ordered in ED Medications  albuterol (VENTOLIN HFA) 108 (90 Base) MCG/ACT inhaler 2 puff (2 puffs Inhalation Given 04/21/2019 1509)  azithromycin (ZITHROMAX) 500 mg in sodium chloride 0.9 % 250 mL IVPB (500 mg Intravenous New Bag/Given 05/17/2019 1530)  methylPREDNISolone sodium succinate (SOLU-MEDROL) 125 mg/2 mL injection 125 mg (125  mg Intravenous Given 04/21/2019 1529)     Initial Impression / Assessment and Plan / ED Course  I have reviewed the triage vital signs and the nursing notes.  Pertinent labs & imaging results that were available during my care of the patient were reviewed by me and considered in my medical decision making (see chart for details).    After the initial evaluation with consideration of COPD exacerbation,  versus infection versus pneumonia versus ACS, the patient had broad labs performed, EKG, x-ray, coronavirus testing.  Patient has received initial albuterol, steroids Now, I have had a chance to review the patient's chart This is most notable for recent hospitalization for pneumonia, sepsis.  Patient was referred to palliative care at that visit, but notes that she is not interested in receiving their services currently.  Patient also noted to have recent radiation therapy for lung cancer, which may be contributing to her cough.  4:03 PM Patient awake alert, remains hemodynamically about the same, no substantial changes, no substantial improvement in her respiratory function. Initial troponin reassuring.  This elderly female with multiple medical issues, most notably COPD, with home oxygen use presents with dyspnea, both at rest and with exertion. Initial findings reassuring in terms of absence of concurrent cardiac disease, infectious disease, but given concern for COPD exacerbation the patient received albuterol, steroids, antibiotics, will require additional therapy, repeat evaluation by Dr. Ronnald Nian for disposition.  Final Clinical Impressions(s) / ED Diagnoses  COPD exacerbation   Carmin Muskrat, MD 04/29/2019 1605

## 2019-05-07 ENCOUNTER — Ambulatory Visit (HOSPITAL_COMMUNITY): Payer: Medicare Other

## 2019-05-07 DIAGNOSIS — J441 Chronic obstructive pulmonary disease with (acute) exacerbation: Principal | ICD-10-CM

## 2019-05-07 DIAGNOSIS — Z7189 Other specified counseling: Secondary | ICD-10-CM

## 2019-05-07 DIAGNOSIS — Z515 Encounter for palliative care: Secondary | ICD-10-CM

## 2019-05-07 LAB — GLUCOSE, CAPILLARY
Glucose-Capillary: 131 mg/dL — ABNORMAL HIGH (ref 70–99)
Glucose-Capillary: 183 mg/dL — ABNORMAL HIGH (ref 70–99)
Glucose-Capillary: 196 mg/dL — ABNORMAL HIGH (ref 70–99)
Glucose-Capillary: 254 mg/dL — ABNORMAL HIGH (ref 70–99)
Glucose-Capillary: 506 mg/dL (ref 70–99)

## 2019-05-07 LAB — CBC
HCT: 36.6 % (ref 36.0–46.0)
Hemoglobin: 11.5 g/dL — ABNORMAL LOW (ref 12.0–15.0)
MCH: 28.9 pg (ref 26.0–34.0)
MCHC: 31.4 g/dL (ref 30.0–36.0)
MCV: 92 fL (ref 80.0–100.0)
Platelets: 243 10*3/uL (ref 150–400)
RBC: 3.98 MIL/uL (ref 3.87–5.11)
RDW: 14.2 % (ref 11.5–15.5)
WBC: 7.9 10*3/uL (ref 4.0–10.5)
nRBC: 0 % (ref 0.0–0.2)

## 2019-05-07 LAB — BASIC METABOLIC PANEL
Anion gap: 12 (ref 5–15)
BUN: 31 mg/dL — ABNORMAL HIGH (ref 8–23)
CO2: 21 mmol/L — ABNORMAL LOW (ref 22–32)
Calcium: 9.1 mg/dL (ref 8.9–10.3)
Chloride: 105 mmol/L (ref 98–111)
Creatinine, Ser: 1.19 mg/dL — ABNORMAL HIGH (ref 0.44–1.00)
GFR calc Af Amer: 49 mL/min — ABNORMAL LOW (ref >60)
GFR calc non Af Amer: 42 mL/min — ABNORMAL LOW (ref >60)
Glucose, Bld: 187 mg/dL — ABNORMAL HIGH (ref 70–99)
Potassium: 4.3 mmol/L (ref 3.5–5.1)
Sodium: 138 mmol/L (ref 135–145)

## 2019-05-07 LAB — PHOSPHORUS: Phosphorus: 3 mg/dL (ref 2.5–4.6)

## 2019-05-07 LAB — MAGNESIUM: Magnesium: 1.9 mg/dL (ref 1.7–2.4)

## 2019-05-07 LAB — TSH: TSH: 1.921 u[IU]/mL (ref 0.350–4.500)

## 2019-05-07 MED ORDER — INSULIN ASPART 100 UNIT/ML ~~LOC~~ SOLN
10.0000 [IU] | Freq: Once | SUBCUTANEOUS | Status: AC
  Administered 2019-05-07: 10 [IU] via INTRAVENOUS

## 2019-05-07 MED ORDER — BUDESONIDE 0.25 MG/2ML IN SUSP
0.2500 mg | Freq: Two times a day (BID) | RESPIRATORY_TRACT | Status: DC
  Administered 2019-05-07 – 2019-05-08 (×4): 0.25 mg via RESPIRATORY_TRACT
  Filled 2019-05-07 (×5): qty 2

## 2019-05-07 MED ORDER — MORPHINE SULFATE (CONCENTRATE) 10 MG/0.5ML PO SOLN
5.0000 mg | ORAL | Status: DC | PRN
  Administered 2019-05-07 – 2019-05-08 (×5): 5 mg via SUBLINGUAL
  Filled 2019-05-07 (×5): qty 0.5

## 2019-05-07 MED ORDER — ENSURE ENLIVE PO LIQD
237.0000 mL | Freq: Two times a day (BID) | ORAL | Status: DC
  Administered 2019-05-07 – 2019-05-08 (×3): 237 mL via ORAL

## 2019-05-07 MED ORDER — ORAL CARE MOUTH RINSE
15.0000 mL | Freq: Two times a day (BID) | OROMUCOSAL | Status: DC
  Administered 2019-05-08: 15 mL via OROMUCOSAL

## 2019-05-07 MED ORDER — AZITHROMYCIN 500 MG PO TABS
250.0000 mg | ORAL_TABLET | Freq: Every day | ORAL | Status: DC
  Administered 2019-05-07 – 2019-05-08 (×2): 250 mg via ORAL
  Filled 2019-05-07 (×2): qty 1

## 2019-05-07 MED ORDER — MORPHINE SULFATE (CONCENTRATE) 10 MG/0.5ML PO SOLN: 5.0000 mg | ORAL | Status: DC | PRN

## 2019-05-07 NOTE — Progress Notes (Signed)
RT NOTE: RT placed patient on 3L [place] per MD note to trial weaning patient off bipap. Patient tolerating well and sating 95%. Patient says her breathing is comfortable. RN aware and RT will continue to monitor.

## 2019-05-07 NOTE — Progress Notes (Signed)
PROGRESS NOTE    [name]  QMV:[id number] DOB: 1932-08-25 DOA: 05/07/2019 PCP: Harlan Stains, MD   Brief Narrative:  Patient is 16 female with history of COPD on 2 L of oxygen at home, non-small cell lung cancer, hypertension, hyperlipidemia, CKD, diabetes mellitus, aortic stenosis, grade 1 diastolic dysfunction who presents with 2 to 3 days history of worsening shortness of breath, increased use of inhalers.  On presentation she was not significant respiratory distress and she had to be put on BiPAP.  ABG showed pH of 7.43, PCO2 of 30 and oxygen saturation of 90%.  BNP was minimally elevated.  She was admitted for the management of COPD exacerbation.  This morning she was still on BiPAP but looked comfortable. She was admitted here and was discharged on July this year after being treated for sepsis, bilateral community-acquired pneumonia.   Assessment & Plan:   Active Problems:   SOB (shortness of breath)   Acute on chronic respiratory failure (HCC)  Acute on chronic hypoxic respiratory failure: Secondary to COPD/CHF exacerbation.  Currently on BiPAP.  Will wean her off BiPAP and put on nasal cannula.  Chest x-ray showed stable bibasilar subsegmental atelectasis/scaring.  COPD exacerbation: Presented with shortness of breath.  No help with increased use of inhalers.  Past smoker.  Follows with Dr. Chase Caller.  Started on steroids, antibiotics.  Continue to monitor respiratory status.  Will wean her off BiPAP and put on nasal cannula.  She is on 3 L of oxygen by nasal cannula at home.  Continue bronchodilators.  Acute on chronic  congestive heart failure: This morning she looks euvolemic.  She was treated with a dose of Lasix IV 40 mg.  She has history of diastolic CHF.  Her last echocardiogram on 4/19 showed ejection fraction of 55 to 60%.  Mild elevated BNP.  CKD stage III: Currently stable  Diabetes mellitus type 2: Continue sliding scale insulin for now.  She takes metformin at  home.  Debility/deconditioning: As per the daughter, she has increasingly being weak, unable to take care of herself.  She has demented husband at home.  Will request for PT/OT evaluation.  Fever: Temperature noted to be 100.4  this morning.  We will continue to monitor.  Will hold on starting on antibiotics.  Hypertension: Mildly elevated blood pressure.  Continue current medicines.  Hyperlipidemia :Continue statin  Depression: Continue Zoloft.  Stage I non-small cell lung cancer: Treated with radiation.  Last treatment in May 2020. Currently in remission. Outpatient follow-up with oncology recommended.  Daughter reported history of significant weight loss recently.        DVT prophylaxis: Lovenox Code Status: Full Family Communication: Discussed with daughter on phone Disposition Plan: Pending clinical improvement.  Pending PT/OT evaluation   Consultants: None  Procedures: None  Antimicrobials:  Anti-infectives (From admission, onward)   Start     Dose/Rate Route Frequency Ordered Stop   05/07/19 1000  azithromycin (ZITHROMAX) tablet 250 mg     250 mg Oral Daily 05/07/19 0944 05/11/19 0959   05/07/19 0045  cefdinir (OMNICEF) capsule 300 mg  Status:  Discontinued     300 mg Oral Daily 04/21/2019 2347 05/07/19 0944   04/28/2019 1515  azithromycin (ZITHROMAX) 500 mg in sodium chloride 0.9 % 250 mL IVPB     500 mg 250 mL/hr over 60 Minutes Intravenous  Once 05/01/2019 1512 04/25/2019 1735      Subjective:  Patient seen and examined the bedside this morning.  Appeared comfortable, hemodynamically stable.  She was still on BiPAP.  She is stable .  She feels better this morning.  Very deconditioned/debilitated  Objective: Vitals:   05/07/19 0125 05/07/19 0340 05/07/19 0737 05/07/19 0739  BP:  (!) 162/95  (!) 153/69  Pulse:  (!) 103  89  Resp:  (!) 21  (!) 26  Temp:  99.8 F (37.7 C)  (!) 100.4 F (38 C)  TempSrc:  Oral  Axillary  SpO2: 96% 98% 95% 97%  Weight:      Height:         Intake/Output Summary (Last 24 hours) at 05/07/2019 0944 Last data filed at 04/20/2019 1735 Gross per 24 hour  Intake 250 ml  Output -  Net 250 ml   Filed Weights   05/01/2019 2149  Weight: 34.2 kg    Examination:  General exam: Deconditioned/debilitated elderly female, cachectic  HEENT:PERRL,Oral mucosa moist, Ear/Nose normal on gross exam Respiratory system: Bilateral decreased air entry Cardiovascular system: S1 & S2 heard, RRR. No JVD, murmurs, rubs, gallops or clicks. No pedal edema. Gastrointestinal system: Abdomen is nondistended, soft and nontender. No organomegaly or masses felt. Normal bowel sounds heard. Central nervous system: Alert and oriented. No focal neurological deficits. Extremities: No edema, no clubbing ,no cyanosis, distal peripheral pulses palpable. Skin: No rashes, lesions or ulcers,no icterus ,no pallor   Data Reviewed: I have personally reviewed following labs and imaging studies  CBC: Recent Labs  Lab 05/04/2019 1434 05/03/2019 2000 05/07/19 0003  WBC 9.0  --  7.9  NEUTROABS 6.3  --   --   HGB 12.9 13.3 11.5*  HCT 41.7 39.0 36.6  MCV 95.0  --  92.0  PLT 245  --  973   Basic Metabolic Panel: Recent Labs  Lab 04/20/2019 1434 04/20/2019 2000 05/07/19 0003  NA 136 137 138  K 4.3 4.6 4.3  CL 100  --  105  CO2 20*  --  21*  GLUCOSE 190*  --  187*  BUN 26*  --  31*  CREATININE 1.01*  --  1.19*  CALCIUM 9.7  --  9.1  MG  --   --  1.9  PHOS  --   --  3.0   GFR: Estimated Creatinine Clearance: 19 mL/min (A) (by C-G formula based on SCr of 1.19 mg/dL (H)). Liver Function Tests: Recent Labs  Lab 04/29/2019 1434  AST 39  ALT 62*  ALKPHOS 82  BILITOT 0.8  PROT 7.6  ALBUMIN 3.8   No results for input(s): LIPASE, AMYLASE in the last 168 hours. No results for input(s): AMMONIA in the last 168 hours. Coagulation Profile: No results for input(s): INR, PROTIME in the last 168 hours. Cardiac Enzymes: No results for input(s): CKTOTAL, CKMB,  CKMBINDEX, TROPONINI in the last 168 hours. BNP (last 3 results) No results for input(s): PROBNP in the last 8760 hours. HbA1C: No results for input(s): HGBA1C in the last 72 hours. CBG: Recent Labs  Lab 05/07/19 0003 05/07/19 0638  GLUCAP 183* 196*   Lipid Profile: No results for input(s): CHOL, HDL, LDLCALC, TRIG, CHOLHDL, LDLDIRECT in the last 72 hours. Thyroid Function Tests: Recent Labs    05/07/19 0003  TSH 1.921   Anemia Panel: No results for input(s): VITAMINB12, FOLATE, FERRITIN, TIBC, IRON, RETICCTPCT in the last 72 hours. Sepsis Labs: No results for input(s): PROCALCITON, LATICACIDVEN in the last 168 hours.  Recent Results (from the past 240 hour(s))  SARS Coronavirus 2 [facility] order, Performed in [facility] hospital lab) Nasopharyngeal Nasopharyngeal Swab  Status: None   Collection Time: 04/22/2019  3:09 PM   Specimen: Nasopharyngeal Swab  Result Value Ref Range Status   SARS Coronavirus 2 NEGATIVE NEGATIVE Final    Comment: (NOTE) If result is NEGATIVE SARS-CoV-2 target nucleic acids are NOT DETECTED. The SARS-CoV-2 RNA is generally detectable in upper and lower  respiratory specimens during the acute phase of infection. The lowest  concentration of SARS-CoV-2 viral copies this assay can detect is 250  copies / mL. A negative result does not preclude SARS-CoV-2 infection  and should not be used as the sole basis for treatment or other  patient management decisions.  A negative result may occur with  improper specimen collection / handling, submission of specimen other  than nasopharyngeal swab, presence of viral mutation(s) within the  areas targeted by this assay, and inadequate number of viral copies  (<250 copies / mL). A negative result must be combined with clinical  observations, patient history, and epidemiological information. If result is POSITIVE SARS-CoV-2 target nucleic acids are DETECTED. The SARS-CoV-2 RNA is generally detectable in upper  and lower  respiratory specimens dur ing the acute phase of infection.  Positive  results are indicative of active infection with SARS-CoV-2.  Clinical  correlation with patient history and other diagnostic information is  necessary to determine patient infection status.  Positive results do  not rule out bacterial infection or co-infection with other viruses. If result is PRESUMPTIVE POSTIVE SARS-CoV-2 nucleic acids MAY BE PRESENT.   A presumptive positive result was obtained on the submitted specimen  and confirmed on repeat testing.  While 2019 novel coronavirus  (SARS-CoV-2) nucleic acids may be present in the submitted sample  additional confirmatory testing may be necessary for epidemiological  and / or clinical management purposes  to differentiate between  SARS-CoV-2 and other Sarbecovirus currently known to infect humans.  If clinically indicated additional testing with an alternate test  methodology [phone]) is advised. The SARS-CoV-2 RNA is generally  detectable in upper and lower respiratory sp ecimens during the acute  phase of infection. The expected result is Negative. Fact Sheet for Patients:  StrictlyIdeas.no Fact Sheet for Healthcare Providers: BankingDealers.co.za This test is not yet approved or cleared by the [place] FDA and has been authorized for detection and/or diagnosis of SARS-CoV-2 by FDA under an Emergency Use Authorization (EUA).  This EUA will remain in effect (meaning this test can be used) for the duration of the COVID-19 declaration under Section 564(b)(1) of the Act, 21 U.S.C. section 360bbb-3(b)(1), unless the authorization is terminated or revoked sooner. Performed at [facility], [place] [address] [place], [place] [address]   MRSA PCR Screening     Status: None   Collection Time: 04/29/2019 10:15 PM   Specimen: Nasal Mucosa; Nasopharyngeal  Result Value Ref Range Status   MRSA by PCR  NEGATIVE NEGATIVE Final    Comment:        The GeneXpert MRSA Assay (FDA approved for NASAL specimens only), is one component of a comprehensive MRSA colonization surveillance program. It is not intended to diagnose MRSA infection nor to guide or monitor treatment for MRSA infections. Performed at [facility], [place] [address] [place], [place] [address]          Radiology Studies: Dg Chest Port 1 View  Result Date: 04/20/2019 CLINICAL DATA:  Shortness of breath. EXAM: PORTABLE CHEST 1 VIEW COMPARISON:  Radiograph of April 11, 2019. FINDINGS: Stable cardiomediastinal silhouette. Atherosclerosis of thoracic aorta is  noted. Status post aortic valve repair. Severe emphysematous disease is noted in both lungs. Stable bibasilar atelectasis or scarring is noted. No definite pleural effusions are noted. Bony thorax is unremarkable. IMPRESSION: Stable bibasilar subsegmental atelectasis or scarring is noted. Status post aortic valve repair. Aortic Atherosclerosis (ICD10-I70.0) and Emphysema (ICD10-J43.9). Electronically Signed   By: Marijo Conception M.D.   On: 04/19/2019 14:50        Scheduled Meds: . albuterol  2.5 mg Nebulization Q6H  . amLODipine  10 mg Oral Daily  . aspirin EC  81 mg Oral QHS  . azithromycin  250 mg Oral Daily  . budesonide (PULMICORT) nebulizer solution  0.25 mg Nebulization BID  . buPROPion  150 mg Oral Daily  . cholecalciferol  1,000 Units Oral QHS  . docusate sodium  100 mg Oral BID  . enoxaparin (LOVENOX) injection  30 mg Subcutaneous Q24H  . ferrous sulfate  325 mg Oral BID  . insulin aspart  0-5 Units Subcutaneous QHS  . insulin aspart  0-9 Units Subcutaneous TID WC  . ipratropium-albuterol  3 mL Nebulization Q6H  . irbesartan  300 mg Oral Daily  . [START ON 05/08/2019] mouth rinse  15 mL Mouth Rinse BID  . methylPREDNISolone (SOLU-MEDROL) injection  40 mg Intravenous Q12H  . multivitamin   Oral Daily  . pravastatin  40 mg Oral QPM  . sertraline   100 mg Oral q morning - 10a   Continuous Infusions:   LOS: 1 day    Time spent: 35 mins.More than 50% of that time was spent in counseling and/or coordination of care.      Shelly Coss, MD [facility] Pager [phone]  If 7PM-7AM, please contact night-coverage [email or web address] Password [facility] 05/07/2019, 9:44 AM

## 2019-05-07 NOTE — Consult Note (Signed)
Consultation Note Date: 05/07/2019   Patient Name: [name]  DOB: 01/20/1932  MRN: [id number]  Age / Sex: 85 y.o., female  PCP: Harlan Stains, MD Referring Physician: Shelly Coss, MD  Reason for Consultation: Establishing goals of care  HPI/Patient Profile: 85 y.o. female  with past medical history of COPD (on home O23L), CHF, NSCLC s/p Radiation tx 12/2017 with no definitive evidence of recurrence (CT scan 5/21 indicated radiation changes but did not entirely exclude possible recurrent disease in L lower lobe), dysphagia (without evidence of aspiration per MBS on last admission- however with moderate risk and recommendations made for outpatient SLP and aspiration precautions, hernia, recent admission in July for pnuemonia admitted on 05/05/2019 with increasing SOB eventually requiring bipap. She is being treated for COPD exacerbation. Palliative medicine consulted for [place].   Clinical Assessment and Goals of Care:   I have reviewed medical records including EPIC notes, labs and imaging, received report from patient's RN- Tammy, assessed the patient and then met at the bedside  to discuss diagnosis prognosis, GOC, EOL wishes, disposition and options.  I introduced Palliative Medicine as specialized medical care for people living with serious illness. It focuses on providing relief from the symptoms and stress of a serious illness. The goal is to improve quality of life for both the patient and the family.  Komal met with Palliative medicine during her last admission and declined outpatient Palliative followup. She declined all home health PT therapies as well.  As far as functional and nutritional status she has not had any improvement and has declined since her last admission. She tells me she at first got a little strongter- but then got weaker and weaker. She has lost significant amount of weight- and  has been eating poorly.   We discussed her current illness and what it means in the larger context of her on-going co-morbidities.  Natural disease trajectory and expectations at EOL were discussed. She tells me she understands her dysphagia is causing her difficulty with her eating and breathing. We further discussed her poor lung status. She stated, "I have bad lungs, a bad heart, and bad kidneys".   When asked about her hopes and goals she stated she wanted to "get stronger" however, she did not want to go to a SNF and she did not want anyone coming into her home to assist in her care.   We discussed the "what if" factor of what her plans are if she cannot get stronger. She stated, "its in the Lord's hands". We discussed concerns that she is at a point that she may not get stronger.  Advanced directives, concepts specific to code status, artifical feeding and hydration, and rehospitalization were considered and discussed. Patient would want to be rehospitalized if she went home and declined further.  Code status was discussed. We discussed the difference between Full Code and what a Do Not Resuscitate order is. When asked what her preference would be- she said, "whatever the doctors think I should do'. I discussed with  her that given her frail state, and advanced lung disease, most providers, myself included would recommend a Do Not Resuscitate order. She replied, "well that's what I'll do".   Her main symptom complaints are shortness of breath and cough. She was very tachypneic during our discussion- she notes she remains SOB even at rest and this limits her ability to function and to sleep. We discussed starting sublingual morphine to increase her comfort and she was very agreeable to this intervention.  Aiyanna agreed to my calling her daughterVira Browns to update her on our conversation.   I called and spoke with Delayne. Delayne tells me that her Mother is very debilitated and is afraid of dying.  She believes that her father and her mother are not aware of how grave her condition is. Delayne wishes to get her Mom some support from Hospice at home, however, she worries that her mother won't agree due to her living situation (they have a bit of a hoarding situation in the home and do not like to have visitors). Delayne agreed to come to hospital with patient's spouse tomorrow for f/u meeting for continued [place].   Patient agreed to followup meeting tomorrow with her family present for further [place].    Primary Decision Maker PATIENT    SUMMARY OF RECOMMENDATIONS -DNR -Full scope care -58m sublingual morphine q2hr prn for SOB -Plan for f/u meeting tomorrow at 1530 with patient's daughter and spouse  Code Status/Advance Care Planning:  DNR   Prognosis:    < 6 months due to advanced lung disease, significant weight loss multiple hospitalizations in the last few months, decrease in functional status  Discharge Planning: To Be Determined  Primary Diagnoses: Present on Admission:  SOB (shortness of breath)  Acute on chronic respiratory failure ([place]   I have reviewed the medical record, interviewed the patient and family, and examined the patient. The following aspects are pertinent.  Past Medical History:  Diagnosis Date   Adenomatous colon polyp 1992   Anxiety    Aortic stenosis    Arthritis    Asthma    uses inhaler as needed   CKD (chronic kidney disease)    COPD (chronic obstructive pulmonary disease) (HCC)    Depression    Diabetes mellitus    Diverticulosis    Emphysema of lung (HCC)    GERD (gastroesophageal reflux disease)    Glaucoma    Hiatal hernia    Hyperlipidemia    Hypertension    [name]    Murmur    Nephrolithiasis    [place] cell lung cancer ([place]    Osteoporosis    Pneumonia 3/17 and 3/18   Social History   Socioeconomic History   Marital status: Married    Spouse name: [name]   Number of  children: 1   Years of education: Not on file   Highest education level: Not on file  Occupational History   Occupation: retired sProducer, television/film/video [place]: Not on file   Food insecurity    Worry: Not on file    Inability: Not on file   Transportation needs    Medical: Not on file    Non-medical: Not on file  Tobacco Use   Smoking status: Former Smoker    Packs/day: 2.00    Years: 15.00    Pack years: 30.00    Types: Cigarettes    Quit date: 09/18/1972    Years since  quitting: 46.6   Smokeless tobacco: Never Used  Substance and Sexual Activity   Alcohol use: No    Alcohol/week: 0.0 standard drinks   Drug use: No   Sexual activity: Never  Lifestyle   Physical activity    Days per week: Not on file    Minutes per session: Not on file   Stress: Not on file  Relationships   Social connections    Talks on phone: Not on file    Gets together: Not on file    Attends religious service: Not on file    Active member of club or organization: Not on file    Attends meetings of clubs or organizations: Not on file    Relationship status: Not on file  Other Topics Concern   Not on file  Social History Narrative   Not on file   Family History  Problem Relation Age of Onset   Emphysema Father    Colon cancer Neg Hx    Esophageal cancer Neg Hx    Rectal cancer Neg Hx    Stomach cancer Neg Hx    Scheduled Meds:  amLODipine  10 mg Oral Daily   aspirin EC  81 mg Oral QHS   azithromycin  250 mg Oral Daily   budesonide (PULMICORT) nebulizer solution  0.25 mg Nebulization BID   buPROPion  150 mg Oral Daily   cholecalciferol  1,000 Units Oral QHS   docusate sodium  100 mg Oral BID   enoxaparin (LOVENOX) injection  30 mg Subcutaneous Q24H   feeding supplement (ENSURE ENLIVE)  237 mL Oral BID BM   ferrous sulfate  325 mg Oral BID   insulin aspart  0-5 Units Subcutaneous QHS   insulin aspart  0-9 Units  Subcutaneous TID WC   insulin aspart  10 Units Intravenous Once   ipratropium-albuterol  3 mL Nebulization Q6H   irbesartan  300 mg Oral Daily   [START ON 05/08/2019] mouth rinse  15 mL Mouth Rinse BID   methylPREDNISolone (SOLU-MEDROL) injection  40 mg Intravenous Q12H   multivitamin   Oral Daily   pravastatin  40 mg Oral QPM   sertraline  100 mg Oral q morning - 10a   Continuous Infusions: PRN Meds:.HYDROcodone-acetaminophen, ipratropium-albuterol, morphine CONCENTRATE, polyethylene glycol Medications Prior to Admission:  Prior to Admission medications   Medication Sig Start Date End Date Taking? Authorizing Provider  acetaminophen (TYLENOL) 500 MG tablet Take 500 mg by mouth every 6 (six) hours as needed for headache.   Yes [provider]  albuterol (PROVENTIL) (2.5 MG/3ML) 0.083% nebulizer solution Take 3 mLs (2.5 mg total) by nebulization every 6 (six) hours as needed for wheezing. 11/23/16  Yes Eugenie Filler, MD  alendronate (FOSAMAX) 70 MG tablet Take 70 mg by mouth once a week. Take with a full glass of water on an empty stomach.  Every Thursday   Yes [provider]  amLODipine (NORVASC) 10 MG tablet Take 1 tablet (10 mg total) by mouth daily. 10/19/15  Yes Crenshaw, Denice Bors, MD  ANORO ELLIPTA 62.5-25 MCG/INH AEPB USE 1 INHALATION BY MOUTH  DAILY Patient taking differently: Inhale 1 puff into the lungs daily.  03/27/18  Yes Brand Males, MD  aspirin EC 81 MG tablet Take 81 mg by mouth at bedtime.   Yes [provider]  buPROPion (WELLBUTRIN XL) 150 MG 24 hr tablet Take 150 mg by mouth daily. 06/04/17  Yes [provider]  cholecalciferol (VITAMIN D) 1000 units tablet Take  1,000 Units by mouth at bedtime.    Yes [provider]  Dextromethorphan-guaiFENesin (ROBITUSSIN DM PO) Take 5 mLs by mouth at bedtime.    Yes [provider]  Docusate Sodium (COLACE PO) Take 100 mg by mouth 2 (two) times daily.    Yes [provider]  feeding supplement, GLUCERNA SHAKE, (GLUCERNA SHAKE) LIQD Take 237 mLs by mouth 3 (three) times daily between meals. Patient taking differently: Take 237 mLs by mouth as needed (supplement).  09/20/18  Yes Lavina Hamman, MD  Ferrous Sulfate (IRON) 325 (65 Fe) MG TABS Take 325 mg by mouth 2 (two) times daily.    Yes [provider]  fluticasone (FLOVENT HFA) 220 MCG/ACT inhaler Inhale 2 puffs into the lungs 2 (two) times daily.   Yes [provider]  guaiFENesin (MUCINEX) 600 MG 12 hr tablet Take 1,200 mg by mouth 2 (two) times daily.   Yes [provider]  HYDROcodone-acetaminophen (NORCO/VICODIN) 5-325 MG tablet Take 1 tablet by mouth every 6 (six) hours as needed. for pain 03/28/19  Yes [provider]  irbesartan (AVAPRO) 300 MG tablet Take 300 mg by mouth daily.  10/05/18  Yes [provider]  Magnesium 250 MG TABS Take 250 mg by mouth at bedtime.    Yes [provider]  metFORMIN (GLUCOPHAGE-XR) 500 MG 24 hr tablet Take 500 mg by mouth every evening. With Dinner 09/30/18  Yes [provider]  Multiple Vitamins-Minerals ([place] PO) Take 1 capsule by mouth daily.    Yes [provider]  polyethylene glycol (MIRALAX / GLYCOLAX) 17 g packet Take 17 g by mouth daily as needed for mild constipation. 03/24/19  Yes Aline August, MD  pravastatin (PRAVACHOL) 40 MG tablet Take 40 mg by mouth every evening.    Yes [provider]  PROAIR HFA 108 (90 BASE) MCG/ACT inhaler Inhale 2 puffs into the lungs 4 (four) times daily as needed for wheezing or shortness of breath.  02/24/14  Yes [provider]  sertraline (ZOLOFT) 100 MG tablet Take 1 tablet (100 mg total) by mouth every morning. 11/24/16  Yes Eugenie Filler, MD  traMADol (ULTRAM) 50 MG tablet Take 1 tablet (50 mg total) by mouth every 6 (six) hours as needed. Patient not taking: Reported on 05/11/2019 03/24/19   Aline August, MD    Allergies  Allergen Reactions   Lipitor [Atorvastatin] Other (See Comments)    myalgias myalgias   Lisinopril Other (See Comments)    cough cough   Amaryl [Glimepiride]     Side effects   Anoro Ellipta [Umeclidinium-Vilanterol]     Increase BP   Avelox [Moxifloxacin Hcl In Nacl]     nausea   Ciprofloxacin     nausea   Other Other (See Comments)    Increased cough   Prevacid [Lansoprazole]     diarrhea   Spiriva Handihaler [Tiotropium Bromide Monohydrate]     Increased cough   Symbicort [Budesonide-Formoterol Fumarate] Cough    Increased cough   Review of Systems  Physical Exam  Vital Signs: BP (!) 148/74 (BP Location: Right Arm)    Pulse 99    Temp (!) 100.8 F (38.2 C) (Axillary)    Resp 19    Ht 5' (1.524 m)    Wt 34.2 kg    SpO2 95%    BMI 14.73 kg/m  Pain Scale: 0-10 POSS *See Group Information*: S-Acceptable,Sleep, easy to arouse Pain Score: 0-No pain   SpO2:  SpO2: 95 % O2 Device:SpO2: 95 % O2 Flow Rate: .O2 Flow Rate (L/min): 3 L/min  IO: Intake/output summary:   Intake/Output Summary (Last 24 hours) at 05/07/2019 1641 Last data filed at 04/28/2019 1735 Gross per 24 hour  Intake 250 ml  Output --  Net 250 ml    LBM: Last BM Date: 05/03/19 Baseline Weight: Weight: 34.2 kg Most recent weight: Weight: 34.2 kg     Palliative Assessment/Data: PPS: 20%     Thank you for this consult. Palliative medicine will continue to follow and assist as needed.   Time In: 1600 Time Out: 1800 Time Total:120 minutes Prolonged services billed: yes Greater than 50%  of this time was spent counseling and coordinating care related to the above assessment and plan.  Signed by: Mariana Kaufman, AGNP-C Palliative Medicine    Please contact Palliative Medicine Team phone at [phone] for questions and concerns.  For individual provider: See Shea Evans

## 2019-05-07 NOTE — Progress Notes (Signed)
Patient's daughter, Samson Frederic, called in.  States patient hasn't had anything to eat or drink since 08/17.  States SLP had previously evaluated patient and states patient can take medications only with yogurt, bigger ones crushed or cut in half; and states patient cannot have thin liquids.  Reports patient weighed 82# at Dr. Lynne Leader office.  Patient weighs in at 75.2# this AM.  Daughter very concerned.  Patient fell at home twice in the last week.  Patient has bruising on left arm from one of these falls.  Daughter is concerned about patient returning home.  Patient's husband is at home, also receiving radiation treatments for prostate CA.  Daughter is concerned about patient safety with both patient and husband unable to care for herself and husband unable to care for her safely.   Daughter requests physician call after patient evaluation.

## 2019-05-07 NOTE — Progress Notes (Signed)
Pt CBG 506; just ate spaghetti, magic cup, vanilla ice cream and diet coke.  Dr. Tawanna Solo notified.

## 2019-05-07 NOTE — Evaluation (Addendum)
Physical Therapy Evaluation Patient Details Name: LAURYL SEYER MRN: [id number] DOB: [birth year] Today's Date: 05/07/2019   History of Present Illness  Patient is 89 female with history of COPD on 2 L of oxygen at home, non-small cell lung cancer, hypertension, hyperlipidemia, CKD, diabetes mellitus, aortic stenosis, grade 1 diastolic dysfunction admitted with acute respiratory distress requiring Bipap due to COPD exacerbation  Clinical Impression  Patient presents with decreased mobility due to generalized weakness, decreased balance and limited activity tolerance with recent falls at home.  Currently min A overall for short distance ambulation with RW.  Suspect some issues with spouse ability to safely assist at home.  Feel likely safest plan for STSNF level rehab prior to d/c home.  PT to follow acutely.     Follow Up Recommendations SNF;Supervision/Assistance - 24 hour    Equipment Recommendations  None recommended by PT    Recommendations for Other Services       Precautions / Restrictions Precautions Precautions: Fall Precaution Comments: 2 recent falls at home, oxygen dependent      Mobility  Bed Mobility Overal bed mobility: Needs Assistance Bed Mobility: Supine to Sit;Sit to Supine     Supine to sit: Supervision Sit to supine: Min guard   General bed mobility comments: assist for safety, lines, pt slightly impulsive coming up prior to environment set up for safety with lines, purewick and rail up; to supine assist for guiding legs onto bed for positioning  Transfers Overall transfer level: Needs assistance Equipment used: Rolling walker (2 wheeled) Transfers: Sit to/from Stand Sit to Stand: Min assist         General transfer comment: for balance/safety (mod cues throughout for safety with hand placement as used toilet in bathroom and sat on armchair at sink with tendency to want to pull up on walker despite having armrests and  grabbar  Ambulation/Gait Ambulation/Gait assistance: Min Web designer (Feet): 70 Feet Assistive device: Rolling walker (2 wheeled) Gait Pattern/deviations: Step-through pattern;Step-to pattern;Decreased stride length;Shuffle;Trunk flexed     General Gait Details: cues and assist for walker proximity, assist to turn walker especially in small space in bathroom, SpO2 86% during ambulation on 2L, but poor pref back up to 95% upon walking into room, dyspnea 3/4 HR up to 141 (122 at rest)  Stairs            Wheelchair Mobility    Modified Rankin (Stroke Patients Only)       Balance Overall balance assessment: Needs assistance   Sitting balance-Leahy Scale: Fair Sitting balance - Comments: sitting in chair with back support to comb hair   Standing balance support: Bilateral upper extremity supported Standing balance-Leahy Scale: Poor Standing balance comment: UE support for balance                             Pertinent Vitals/Pain Pain Assessment: No/denies pain    Home Living Family/patient expects to be discharged to:: Private residence Living Arrangements: Spouse/significant other Available Help at Discharge: Family;Available 24 hours/day Type of Home: House Home Access: Stairs to enter Entrance Stairs-Rails: Right Entrance Stairs-Number of Steps: 3-4 Home Layout: Two level;Laundry or work area in basement;Able to live on main level with bedroom/bathroom Home Equipment: Grab bars - tub/shower;Walker - 2 wheels;Walker - 4 wheels;Cane - single point      Prior Function Level of Independence: Needs assistance   Gait / Transfers Assistance Needed: spouse having to assist, two recent falls  Hand Dominance        Extremity/Trunk Assessment   Upper Extremity Assessment Upper Extremity Assessment: Generalized weakness    Lower Extremity Assessment Lower Extremity Assessment: Generalized weakness    Cervical / Trunk  Assessment Cervical / Trunk Assessment: Kyphotic  Communication   Communication: No difficulties  Cognition Arousal/Alertness: Awake/alert Behavior During Therapy: Impulsive Overall Cognitive Status: No family/caregiver present to determine baseline cognitive functioning Area of Impairment: Safety/judgement;Memory                     Memory: Decreased short-term memory   Safety/Judgement: Decreased awareness of safety;Decreased awareness of deficits     General Comments: just started on a diet and kept thinking she had a tray at nurses desk we needed to heat up, but RN reports her tray has been ordered but not here yet.      General Comments General comments (skin integrity, edema, etc.): Patient toileted in bathoom min a for balance to doff and don briefs and pt able to perform hygiene.  Sat in chair at sink to comb hair.  Spouse entered room near end of session, asked questions to him regarding home set up and needed at times repeating question as did not answer appropriately (?? HOH versus cognitive issues)    Exercises     Assessment/Plan    PT Assessment Patient needs continued PT services  PT Problem List Decreased strength;Decreased activity tolerance;Decreased mobility;Decreased safety awareness;Cardiopulmonary status limiting activity;Decreased knowledge of use of DME;Decreased balance       PT Treatment Interventions DME instruction;Therapeutic activities;Balance training;Patient/family education;Therapeutic exercise;Functional mobility training;Gait training    PT Goals (Current goals can be found in the Care Plan section)  Acute Rehab PT Goals Patient Stated Goal: to get stronger PT Goal Formulation: With patient/family Time For Goal Achievement: 05/21/19 Potential to Achieve Goals: Fair    Frequency Min 2X/week   Barriers to discharge        Co-evaluation               AM-PAC PT "6 Clicks" Mobility  Outcome Measure Help needed turning from  your back to your side while in a flat bed without using bedrails?: A Little Help needed moving from lying on your back to sitting on the side of a flat bed without using bedrails?: A Little Help needed moving to and from a bed to a chair (including a wheelchair)?: A Little Help needed standing up from a chair using your arms (e.g., wheelchair or bedside chair)?: A Little Help needed to walk in hospital room?: A Lot Help needed climbing 3-5 steps with a railing? : Total 6 Click Score: 15    End of Session Equipment Utilized During Treatment: Oxygen Activity Tolerance: Patient limited by fatigue Patient left: in bed;with call bell/phone within reach;with bed alarm set;with family/visitor present   PT Visit Diagnosis: Other abnormalities of gait and mobility (R26.89);Muscle weakness (generalized) (M62.81)    Time: 0865-7846 PT Time Calculation (min) (ACUTE ONLY): 31 min   Charges:   PT Evaluation $PT Eval Moderate Complexity: 1 Mod PT Treatments $Gait Training: 8-22 mins        Magda Kiel, [place] [facility] [phone] 05/07/2019   Reginia Naas 05/07/2019, 3:42 PM

## 2019-05-07 NOTE — Plan of Care (Signed)
  Problem: Education: Goal: Knowledge of General Education information will improve Description: Including pain rating scale, medication(s)/side effects and non-pharmacologic comfort measures Outcome: Progressing   Problem: Health Behavior/Discharge Planning: Goal: Ability to manage health-related needs will improve Outcome: Progressing   Problem: Clinical Measurements: Goal: Diagnostic test results will improve Outcome: Progressing   Problem: Clinical Measurements: Goal: Respiratory complications will improve Outcome: Progressing   Problem: Activity: Goal: Risk for activity intolerance will decrease Outcome: Progressing   Problem: Nutrition: Goal: Adequate nutrition will be maintained Outcome: Progressing   Problem: Elimination: Goal: Will not experience complications related to bowel motility Outcome: Progressing   Problem: Pain Managment: Goal: General experience of comfort will improve Outcome: Progressing   Problem: Safety: Goal: Ability to remain free from injury will improve Outcome: Progressing   Problem: Skin Integrity: Goal: Risk for impaired skin integrity will decrease Outcome: Progressing

## 2019-05-07 NOTE — Progress Notes (Signed)
Initial Nutrition Assessment  DOCUMENTATION CODES:   Severe malnutrition in context of chronic illness, Underweight  INTERVENTION:   - Liberalize diet to Regular, verbal with readback order placed per discussion with Dr. Tawanna Strong  - Continue MVI with minerals daily  - Ensure Enlive po BID, each supplement provides 350 kcal and 20 grams of protein  - Magic cup TID with meals, each supplement provides 290 kcal and 9 grams of protein  NUTRITION DIAGNOSIS:   Severe Malnutrition related to chronic illness (COPD, non-small cell lung cancer, CHF) as evidenced by severe muscle depletion, severe fat depletion, percent weight loss (27.8% weight loss in less than 10 months).  GOAL:   Patient will meet greater than or equal to 90% of their needs  MONITOR:   PO intake, Supplement acceptance, Weight trends, I & O's, Labs  REASON FOR ASSESSMENT:   Malnutrition Screening Tool    ASSESSMENT:   85 year old female who presented to the ED on 8/18 with SOB. PMH of non-small cell lung cancer treated with radiation (last treatment in May 2020), COPD, CKD stage III, T2DM, GERD, HLD, HTN, IBS, diastolic CHF.   Noted plan for pt to be weaned off BiPAP to nasal cannula this AM. At time of RD visit, pt still requiring BiPAP so difficult to obtain much history from pt. Will re-attempt at follow-up.  Noted untouched meal tray at bedside. NT reports plan is to save meal tray for pt once she is off BiPAP so that she can eat.  Spoke with pt briefly at bedside. Pt indicates she hasn't eaten for 2 days and is very worried that her tray will be removed from the room before she has time to eat it. Pt difficult to understand but indicates that she is hungry. Pt indicates she has been losing weight.  Reviewed weight history in chart. Pt with progressive weight loss since October 2019. Overall, pt has lost 13.2 kg since 07/01/18. This is a 27.8% weight loss in less than 10 months which is severe and significant  for timeframe.  RD will order Ensure Enlive and Magic Cup supplements to aid pt in meeting kcal and protein needs. Pt meets criteria for severe malnutrition.  Medications reviewed and include: cholecalciferol, Colace, ferrous sulfate, SSI, Solu-medrol, MVI  Labs reviewed. CBG's: 196, 183  NUTRITION - FOCUSED PHYSICAL EXAM:    Most Recent Value  Orbital Region  Severe depletion  Upper Arm Region  Severe depletion  Thoracic and Lumbar Region  Severe depletion  Buccal Region  Unable to assess  Temple Region  Severe depletion  Clavicle Bone Region  Severe depletion  Clavicle and Acromion Bone Region  Severe depletion  Scapular Bone Region  Severe depletion  Dorsal Hand  Severe depletion  Patellar Region  Severe depletion  Anterior Thigh Region  Severe depletion  Posterior Calf Region  Severe depletion  Edema (RD Assessment)  None  Hair  Reviewed  Eyes  Reviewed  Mouth  Reviewed  Skin  Reviewed  Nails  Reviewed       Diet Order:   Diet Order            Diet regular Room service appropriate? Yes; Fluid consistency: Thin  Diet effective now              EDUCATION NEEDS:   Not appropriate for education at this time  Skin:  Skin Assessment: Reviewed RN Assessment  Last BM:  05/03/19  Height:   Ht Readings from Last 1 Encounters:  05/19/2019 5' (  1.524 m)    Weight:   Wt Readings from Last 1 Encounters:  04/22/2019 34.2 kg    Ideal Body Weight:  45.5 kg  BMI:  Body mass index is 14.73 kg/m.  Estimated Nutritional Needs:   Kcal:  1200-1400  Protein:  45-60 grams  Fluid:  >/= 1.4 L    Desiree Face, MS, RD, LDN Inpatient Clinical Dietitian Pager: [phone] Weekend/After Hours: [phone]

## 2019-05-08 LAB — GLUCOSE, CAPILLARY
Glucose-Capillary: 299 mg/dL — ABNORMAL HIGH (ref 70–99)
Glucose-Capillary: 315 mg/dL — ABNORMAL HIGH (ref 70–99)
Glucose-Capillary: 220 mg/dL — ABNORMAL HIGH (ref 70–99)
Glucose-Capillary: 371 mg/dL — ABNORMAL HIGH (ref 70–99)

## 2019-05-08 LAB — HEMOGLOBIN A1C
Hgb A1c MFr Bld: 6.6 % — ABNORMAL HIGH (ref 4.8–5.6)
Mean Plasma Glucose: 142.72 mg/dL

## 2019-05-08 MED ORDER — SENNA 8.6 MG PO TABS: 1.0000 | ORAL_TABLET | Freq: Two times a day (BID) | ORAL | Status: DC

## 2019-05-08 MED ORDER — INSULIN GLARGINE 100 UNIT/ML ~~LOC~~ SOLN
5.0000 [IU] | Freq: Every day | SUBCUTANEOUS | Status: DC
  Administered 2019-05-08: 5 [IU] via SUBCUTANEOUS
  Filled 2019-05-08 (×2): qty 0.05

## 2019-05-08 MED ORDER — IPRATROPIUM-ALBUTEROL 0.5-2.5 (3) MG/3ML IN SOLN: 3.0000 mL | Freq: Two times a day (BID) | RESPIRATORY_TRACT | Status: DC

## 2019-05-08 MED ORDER — IPRATROPIUM-ALBUTEROL 0.5-2.5 (3) MG/3ML IN SOLN
3.0000 mL | Freq: Two times a day (BID) | RESPIRATORY_TRACT | Status: DC
  Administered 2019-05-08: 3 mL via RESPIRATORY_TRACT
  Filled 2019-05-08 (×2): qty 3

## 2019-05-08 MED ORDER — PREDNISONE 20 MG PO TABS: 40.0000 mg | ORAL_TABLET | Freq: Every day | ORAL | Status: DC

## 2019-05-08 MED ORDER — GUAIFENESIN 100 MG/5ML PO SOLN
5.0000 mL | Freq: Four times a day (QID) | ORAL | Status: DC
  Administered 2019-05-08 (×2): 100 mg via ORAL
  Filled 2019-05-08 (×2): qty 5

## 2019-05-08 NOTE — Progress Notes (Signed)
PROGRESS NOTE    [name]  ZOX:[id number] DOB: February 27, 1932 DOA: 05/14/2019 PCP: Harlan Stains, MD   Brief Narrative:  Patient is 39 female with history of COPD on 2 L of oxygen at home, non-small cell lung cancer, hypertension, hyperlipidemia, CKD, diabetes mellitus, aortic stenosis, grade 1 diastolic dysfunction who presents with 2 to 3 days history of worsening shortness of breath, increased use of inhalers.  On presentation, she was not significant respiratory distress and she had to be put on BiPAP.  ABG showed pH of 7.43, PCO2 of 30 and oxygen saturation of 90%.  BNP was minimally elevated.  She was admitted for the management of COPD exacerbation.She was admitted here and was discharged on July this year after being treated for sepsis, bilateral community-acquired pneumonia. This morning her respiratory status has improved.  Currently on nasal cannula at 4 L/min.  Patient evaluated by PT and recommended skilled nursing facility on discharge.  Palliative care also following.   Assessment & Plan:   Active Problems:   SOB (shortness of breath)   Acute on chronic respiratory failure (HCC)   Advanced care planning/counseling discussion   Goals of care, counseling/discussion   Palliative care by specialist  Acute on chronic hypoxic respiratory failure: Secondary to COPD/CHF exacerbation.  off BiPAP and on nasal cannula.  Chest x-ray showed stable bibasilar subsegmental atelectasis/scaring.  COPD exacerbation: Presented with shortness of breath.  No help with increased use of inhalers at home.  Past smoker.  Follows with Dr. Chase Caller.  Started on steroids, antibiotics.  Continue to monitor respiratory status.  On 4 L/min oxygen per minute now.  She is on 3 L of oxygen by nasal cannula at home.  Continue bronchodilators.  Steroids changed to oral today.  Acute on chronic  congestive heart failure: This morning she looks euvolemic.  She was treated with a dose of Lasix IV 40 mg.  She  has history of diastolic CHF.  Her last echocardiogram on 4/19 showed ejection fraction of 55 to 60%.  Mild elevated BNP.  CKD stage III: Currently stable  Diabetes mellitus type 2: Continue sliding scale insulin .  She takes metformin at home.  Blood sugar is high most likely secondary to steroids.  Added Lantus 5 units at bedtime.  Hypertension: BP soft this morning.She is on amlodipine and irbesartan at home.  Will hold antihypertensives.  Hyperlipidemia :Continue statin  Depression: Continue Zoloft.  Stage I non-small cell lung cancer: Treated with radiation.  Last treatment in May 2020. Currently in remission. Outpatient follow-up with oncology recommended.  Daughter reported history of significant weight loss recently.  Severe malnutrition: Dietitian following.  Continue regular diet  Debility/deconditioning/advanced age/multiple comorbidities: As per the daughter, she has increasingly being weak, unable to take care of herself.  Patient has demented husband at home. PT/OT evaluation done and recommended SNF. SW consulted. Palliative care also following.   Nutrition Problem: Severe Malnutrition Etiology: chronic illness(COPD, non-small cell lung cancer, CHF)      DVT prophylaxis: Lovenox Code Status: Full Family Communication: Discussed with daughter on phone on 05/07/19 Disposition Plan: SNF in 24 -48 hrs .Needs at least one more night of inpatient stay.  Pending improvement in the respiratory status  Consultants: None  Procedures: None  Antimicrobials:  Anti-infectives (From admission, onward)   Start     Dose/Rate Route Frequency Ordered Stop   05/07/19 1000  azithromycin (ZITHROMAX) tablet 250 mg     250 mg Oral Daily 05/07/19 0944 05/11/19 0959  05/07/19 0045  cefdinir (OMNICEF) capsule 300 mg  Status:  Discontinued     300 mg Oral Daily 05/01/2019 2347 05/07/19 0944   04/27/2019 1515  azithromycin (ZITHROMAX) 500 mg in sodium chloride 0.9 % 250 mL IVPB     500 mg  250 mL/hr over 60 Minutes Intravenous  Once 04/21/2019 1512 05/05/2019 1735      Subjective:  Patient seen and examined the bedside this morning.  Looked much better today.  Off BiPAP.  Currently on nasal cannula.  Respiratory status has improved.  Still bothered by cough.  Feels better .  Objective: Vitals:   05/08/19 0130 05/08/19 0821 05/08/19 0822 05/08/19 1100  BP:      Pulse: 80 84    Resp: (!) 23 (!) 22    Temp:    97.6 F (36.4 C)  TempSrc:    Oral  SpO2: 95% 94% 94%   Weight:      Height:       No intake or output data in the 24 hours ending 05/08/19 1237 Filed Weights   04/26/2019 2149  Weight: 34.2 kg    Examination:   General exam: Extremely deconditioned/debilitated elderly female, fragile  HEENT:PERRL,Oral mucosa moist, Ear/Nose normal on gross exam Respiratory system: Bilateral coarse breathing sounds.  No wheezes or crackles today. Cardiovascular system: S1 & S2 heard, RRR. No JVD, murmurs, rubs, gallops or clicks. Gastrointestinal system: Abdomen is nondistended, soft and nontender. No organomegaly or masses felt. Normal bowel sounds heard. Central nervous system: Alert and oriented. No focal neurological deficits. Extremities: No edema, no clubbing ,no cyanosis, distal peripheral pulses palpable. Skin: No rashes, lesions or ulcers,no icterus ,no pallor MSK: Muscle wasting   Data Reviewed: I have personally reviewed following labs and imaging studies  CBC: Recent Labs  Lab 05/18/2019 1434 05/03/2019 2000 05/07/19 0003  WBC 9.0  --  7.9  NEUTROABS 6.3  --   --   HGB 12.9 13.3 11.5*  HCT 41.7 39.0 36.6  MCV 95.0  --  92.0  PLT 245  --  127   Basic Metabolic Panel: Recent Labs  Lab 05/03/2019 1434 05/03/2019 2000 05/07/19 0003  NA 136 137 138  K 4.3 4.6 4.3  CL 100  --  105  CO2 20*  --  21*  GLUCOSE 190*  --  187*  BUN 26*  --  31*  CREATININE 1.01*  --  1.19*  CALCIUM 9.7  --  9.1  MG  --   --  1.9  PHOS  --   --  3.0   GFR: Estimated  Creatinine Clearance: 19 mL/min (A) (by C-G formula based on SCr of 1.19 mg/dL (H)). Liver Function Tests: Recent Labs  Lab 05/10/2019 1434  AST 39  ALT 62*  ALKPHOS 82  BILITOT 0.8  PROT 7.6  ALBUMIN 3.8   No results for input(s): LIPASE, AMYLASE in the last 168 hours. No results for input(s): AMMONIA in the last 168 hours. Coagulation Profile: No results for input(s): INR, PROTIME in the last 168 hours. Cardiac Enzymes: No results for input(s): CKTOTAL, CKMB, CKMBINDEX, TROPONINI in the last 168 hours. BNP (last 3 results) No results for input(s): PROBNP in the last 8760 hours. HbA1C: Recent Labs    05/08/19 0228  HGBA1C 6.6*   CBG: Recent Labs  Lab 05/07/19 1054 05/07/19 1617 05/07/19 2104 05/08/19 0609 05/08/19 1128  GLUCAP 131* 506* 254* 299* 315*   Lipid Profile: No results for input(s): CHOL, HDL, LDLCALC, TRIG, CHOLHDL,  LDLDIRECT in the last 72 hours. Thyroid Function Tests: Recent Labs    05/07/19 0003  TSH 1.921   Anemia Panel: No results for input(s): VITAMINB12, FOLATE, FERRITIN, TIBC, IRON, RETICCTPCT in the last 72 hours. Sepsis Labs: No results for input(s): PROCALCITON, LATICACIDVEN in the last 168 hours.  Recent Results (from the past 240 hour(s))  SARS Coronavirus 2 [facility] order, Performed in [facility] hospital lab) Nasopharyngeal Nasopharyngeal Swab     Status: None   Collection Time: 04/26/2019  3:09 PM   Specimen: Nasopharyngeal Swab  Result Value Ref Range Status   SARS Coronavirus 2 NEGATIVE NEGATIVE Final    Comment: (NOTE) If result is NEGATIVE SARS-CoV-2 target nucleic acids are NOT DETECTED. The SARS-CoV-2 RNA is generally detectable in upper and lower  respiratory specimens during the acute phase of infection. The lowest  concentration of SARS-CoV-2 viral copies this assay can detect is 250  copies / mL. A negative result does not preclude SARS-CoV-2 infection  and should not be used as the sole basis for treatment or other   patient management decisions.  A negative result may occur with  improper specimen collection / handling, submission of specimen other  than nasopharyngeal swab, presence of viral mutation(s) within the  areas targeted by this assay, and inadequate number of viral copies  (<250 copies / mL). A negative result must be combined with clinical  observations, patient history, and epidemiological information. If result is POSITIVE SARS-CoV-2 target nucleic acids are DETECTED. The SARS-CoV-2 RNA is generally detectable in upper and lower  respiratory specimens dur ing the acute phase of infection.  Positive  results are indicative of active infection with SARS-CoV-2.  Clinical  correlation with patient history and other diagnostic information is  necessary to determine patient infection status.  Positive results do  not rule out bacterial infection or co-infection with other viruses. If result is PRESUMPTIVE POSTIVE SARS-CoV-2 nucleic acids MAY BE PRESENT.   A presumptive positive result was obtained on the submitted specimen  and confirmed on repeat testing.  While 2019 novel coronavirus  (SARS-CoV-2) nucleic acids may be present in the submitted sample  additional confirmatory testing may be necessary for epidemiological  and / or clinical management purposes  to differentiate between  SARS-CoV-2 and other Sarbecovirus currently known to infect humans.  If clinically indicated additional testing with an alternate test  methodology [phone]) is advised. The SARS-CoV-2 RNA is generally  detectable in upper and lower respiratory sp ecimens during the acute  phase of infection. The expected result is Negative. Fact Sheet for Patients:  StrictlyIdeas.no Fact Sheet for Healthcare Providers: BankingDealers.co.za This test is not yet approved or cleared by the [place] FDA and has been authorized for detection and/or diagnosis of SARS-CoV-2 by  FDA under an Emergency Use Authorization (EUA).  This EUA will remain in effect (meaning this test can be used) for the duration of the COVID-19 declaration under Section 564(b)(1) of the Act, 21 U.S.C. section 360bbb-3(b)(1), unless the authorization is terminated or revoked sooner. Performed at [facility], [place] [address] [place], [place] [address]   MRSA PCR Screening     Status: None   Collection Time: 05/08/2019 10:15 PM   Specimen: Nasal Mucosa; Nasopharyngeal  Result Value Ref Range Status   MRSA by PCR NEGATIVE NEGATIVE Final    Comment:        The GeneXpert MRSA Assay (FDA approved for NASAL specimens only), is one component of a comprehensive MRSA colonization surveillance  program. It is not intended to diagnose MRSA infection nor to guide or monitor treatment for MRSA infections. Performed at [facility], [place] [address] [place], [place] [address]          Radiology Studies: Dg Chest Port 1 View  Result Date: 05/10/2019 CLINICAL DATA:  Shortness of breath. EXAM: PORTABLE CHEST 1 VIEW COMPARISON:  Radiograph of April 11, 2019. FINDINGS: Stable cardiomediastinal silhouette. Atherosclerosis of thoracic aorta is noted. Status post aortic valve repair. Severe emphysematous disease is noted in both lungs. Stable bibasilar atelectasis or scarring is noted. No definite pleural effusions are noted. Bony thorax is unremarkable. IMPRESSION: Stable bibasilar subsegmental atelectasis or scarring is noted. Status post aortic valve repair. Aortic Atherosclerosis (ICD10-I70.0) and Emphysema (ICD10-J43.9). Electronically Signed   By: Marijo Conception M.D.   On: 04/25/2019 14:50        Scheduled Meds: . amLODipine  10 mg Oral Daily  . aspirin EC  81 mg Oral QHS  . azithromycin  250 mg Oral Daily  . budesonide (PULMICORT) nebulizer solution  0.25 mg Nebulization BID  . buPROPion  150 mg Oral Daily  . cholecalciferol  1,000 Units Oral QHS  . docusate sodium  100 mg  Oral BID  . enoxaparin (LOVENOX) injection  30 mg Subcutaneous Q24H  . feeding supplement (ENSURE ENLIVE)  237 mL Oral BID BM  . ferrous sulfate  325 mg Oral BID  . insulin aspart  0-5 Units Subcutaneous QHS  . insulin aspart  0-9 Units Subcutaneous TID WC  . insulin glargine  5 Units Subcutaneous Daily  . ipratropium-albuterol  3 mL Nebulization Q6H  . irbesartan  300 mg Oral Daily  . mouth rinse  15 mL Mouth Rinse BID  . multivitamin   Oral Daily  . pravastatin  40 mg Oral QPM  . [START ON 05/16/19] predniSONE  40 mg Oral Q breakfast  . sertraline  100 mg Oral q morning - 10a   Continuous Infusions:   LOS: 2 days    Time spent: 35 mins.More than 50% of that time was spent in counseling and/or coordination of care.      Shelly Coss, MD [facility] Pager [phone]  If 7PM-7AM, please contact night-coverage [email or web address] Password [email or web address] 05/08/2019, 12:37 PM

## 2019-05-08 NOTE — Progress Notes (Signed)
Daily Progress Note   Patient Name: [name]       Date: 05/08/2019 DOB: 1932-04-19  Age: 85 y.o. MRN#: [id number] Attending Physician: Shelly Coss, MD Primary Care Physician: Harlan Stains, MD Admit Date: 04/30/2019  Reason for Consultation/Follow-up: Establishing goals of care  Subjective: Met with patient, daughter, and spouse at bedside. Reviewed her clinical course, illness trajectory, functional and nutritional status.  Patient notes that sublingual morphine is helping significantly with her SOB. Discussed concerns she is at end stages of illness- noting significant decrease in functional status, weight loss. Discussed what her [place] would be.  Patient set limits of DNR, stated she would not want a feeding tube.  Discussed options of going home with Hospice support vs home with PT vs SNF rehab.  We discussed concerns that even with rehab she may continue to decline given her comorbidities and advanced illness.  We discussed reasons for previously declining PT at home and pt and spouse stated they "just asked a lot of questions".  Patient and daughter were very receptive to all options and requested time to process information regarding prognosis and make decisions. Patient's spouse requested to discuss my recommendations with patient's attending physician.   Review of Systems  Constitutional: Positive for malaise/fatigue and weight loss.  Respiratory: Positive for cough and shortness of breath.     Length of Stay: 2  Current Medications: Scheduled Meds:  . aspirin EC  81 mg Oral QHS  . azithromycin  250 mg Oral Daily  . budesonide (PULMICORT) nebulizer solution  0.25 mg Nebulization BID  . buPROPion  150 mg Oral Daily  . cholecalciferol  1,000 Units Oral QHS  .  docusate sodium  100 mg Oral BID  . enoxaparin (LOVENOX) injection  30 mg Subcutaneous Q24H  . feeding supplement (ENSURE ENLIVE)  237 mL Oral BID BM  . ferrous sulfate  325 mg Oral BID  . guaiFENesin  5 mL Oral Q6H  . insulin aspart  0-5 Units Subcutaneous QHS  . insulin aspart  0-9 Units Subcutaneous TID WC  . insulin glargine  5 Units Subcutaneous Daily  . [START ON 05-10-2019] ipratropium-albuterol  3 mL Nebulization BID  . mouth rinse  15 mL Mouth Rinse BID  . multivitamin   Oral Daily  . pravastatin  40 mg Oral QPM  . [  START ON 06/05/2019] predniSONE  40 mg Oral Q breakfast  . sertraline  100 mg Oral q morning - 10a    Continuous Infusions:   PRN Meds: HYDROcodone-acetaminophen, ipratropium-albuterol, morphine CONCENTRATE, polyethylene glycol  Physical Exam Vitals signs and nursing note reviewed.  Cardiovascular:     Rate and Rhythm: Normal rate.  Pulmonary:     Effort: Pulmonary effort is normal.  Skin:    Coloration: Skin is pale.  Neurological:     Mental Status: She is alert and oriented to person, place, and time.  Psychiatric:        Mood and Affect: Mood normal.        Behavior: Behavior normal.        Judgment: Judgment normal.             Vital Signs: BP (!) 80/49   Pulse 84   Temp 97.6 F (36.4 C) (Oral)   Resp (!) 22   Ht 5' (1.524 m)   Wt 34.2 kg   SpO2 94%   BMI 14.73 kg/m  SpO2: SpO2: 94 % O2 Device: O2 Device: Nasal Cannula O2 Flow Rate: O2 Flow Rate (L/min): 5 L/min  Intake/output summary:   Intake/Output Summary (Last 24 hours) at 05/08/2019 1700 Last data filed at 05/08/2019 1300 Gross per 24 hour  Intake 240 ml  Output -  Net 240 ml   LBM: Last BM Date: 05/03/19 Baseline Weight: Weight: 34.2 kg Most recent weight: Weight: 34.2 kg       Palliative Assessment/Data: PPS: 20%      Patient Active Problem List   Diagnosis Date Noted  . Advanced care planning/counseling discussion   . Goals of care, counseling/discussion   .  Palliative care by specialist   . SOB (shortness of breath) 05/04/2019  . Acute on chronic respiratory failure ([place]) 04/27/2019  . Community acquired pneumonia, bilateral 04/11/2019  . Sepsis due to pneumonia ([place]) 03/21/2019  . Hypertensive urgency 03/21/2019  . Non-small cell lung cancer ([place]) 03/21/2019  . Leukocytosis 03/21/2019  . Pain due to onychomycosis of toenails of both feet 03/14/2019  . Diabetes mellitus without complication ([place]) 82/50/0370  . Chest tube in place   . Acute pneumothorax   . Pneumothorax on left   . Protein-calorie malnutrition, severe 09/06/2018  . Community acquired pneumonia of left lower lobe of lung ([place]) 09/05/2018  . Pulmonary nodule 12/03/2017  . Iron deficiency anemia   . Heme + stool   . CAD (coronary artery disease) 10/27/2017  . Microcytic anemia 10/27/2017  . Anemia due to GI blood loss 10/27/2017  . COPD with acute exacerbation ([place]) 10/27/2017  . Pulmonary nodule, left 10/15/2017  . Anemia, chronic disease 12/07/2016  . Chronic obstructive pulmonary disease ([place])   . Acute on chronic respiratory failure with hypoxia ([place]) 11/19/2016  . CKD (chronic kidney disease), stage III ([place]) 11/19/2016  . Diabetes mellitus type 2, uncontrolled ([place]) 11/19/2016  . Recurrent pneumonia 11/19/2016  . Chronic diastolic CHF (congestive heart failure) ([place]) 11/19/2016  . Pulmonary HTN ([place]) 11/19/2016  . HLD (hyperlipidemia) 11/19/2016  . Hiatal hernia 05/01/2016  . History of bacterial pneumonia 05/01/2016  . COPD exacerbation ([place]) 11/18/2015  . Physical deconditioning 07/24/2013  . S/P AVR (aortic valve replacement) 07/23/2013  . S/P CABG x 1 07/23/2013  . Aortic stenosis 06/10/2013  . Essential hypertension 06/10/2013  . COPD GOLD II  10/26/2011    Palliative Care Assessment & Plan   Patient Profile:  85 y.o. female  with  past medical history of COPD (on home O23L), CHF, NSCLC s/p Radiation tx 12/2017 with no definitive evidence of recurrence  (CT scan 5/21 indicated radiation changes but did not entirely exclude possible recurrent disease in L lower lobe), dysphagia (without evidence of aspiration per MBS on last admission- however with moderate risk and recommendations made for outpatient SLP and aspiration precautions, hernia, recent admission in July for pnuemonia admitted on 05/19/2019 with increasing SOB eventually requiring bipap. She is being treated for COPD exacerbation. Palliative medicine consulted for [place].   Assessment/Recommendations/Plan   Patient sets limits today of DNR and no feeding tube  She and family are considering options of home with Hospice vs SNF facility for rehab  If patient goes to SNF recommend [facility]  Discharging physician please continue sublingual morphine at discharge for SOB  Will add Senna as prophylaxis to prevent constipation  Patient's spouse requested to speak with attending MD- have messaged Dr. Tawanna Solo  Goals of Care and Additional Recommendations:  Limitations on Scope of Treatment: No Artificial Feeding  Code Status:  DNR  Prognosis:   < 6 months  Discharge Planning:  To Be Determined  Care plan was discussed with patient, spouse and patient's daughter.  Thank you for allowing the Palliative Medicine Team to assist in the care of this patient.  Time In: 1530 Time Out: 1700 Total Time 90 minutes Prolonged Time Billed Yes      Greater than 50%  of this time was spent counseling and coordinating care related to the above assessment and plan.  Mariana Kaufman, AGNP-C Palliative Medicine   Please contact Palliative Medicine Team phone at [phone] for questions and concerns.

## 2019-05-08 NOTE — Plan of Care (Signed)
  Problem: Education: Goal: Knowledge of General Education information will improve Description: Including pain rating scale, medication(s)/side effects and non-pharmacologic comfort measures Outcome: Progressing   Problem: Clinical Measurements: Goal: Will remain free from infection Outcome: Progressing   Problem: Clinical Measurements: Goal: Diagnostic test results will improve Outcome: Progressing   Problem: Clinical Measurements: Goal: Respiratory complications will improve Outcome: Progressing   Problem: Nutrition: Goal: Adequate nutrition will be maintained Outcome: Progressing   Problem: Elimination: Goal: Will not experience complications related to bowel motility Outcome: Progressing   Problem: Pain Managment: Goal: General experience of comfort will improve Outcome: Progressing   Problem: Skin Integrity: Goal: Risk for impaired skin integrity will decrease Outcome: Progressing

## 2019-05-08 NOTE — Progress Notes (Signed)
Talked to husband on phone, gave update. He said he wants her back to home after going to rehab. He will decide about hospice after she comes back home.

## 2019-05-08 NOTE — NC FL2 (Signed)
[place] MEDICAID FL2 LEVEL OF CARE SCREENING TOOL     IDENTIFICATION  Patient Name: [name] Birthdate: [birth year] Sex: female Admission Date (Current Location): 05/10/2019  [facility] and [place] Number:  Herbalist and Address:  [facility] [place] [place] [address] [place], [place] [address]      Provider Number: [id number]  Attending Physician Name and Address:  Shelly Coss, MD  Relative Name and Phone Number:       Current Level of Care: Hospital Recommended Level of Care: [place] Prior Approval Number:    Date Approved/Denied:   PASRR Number: [id number]  Discharge Plan: SNF    Current Diagnoses: Patient Active Problem List   Diagnosis Date Noted  . Advanced care planning/counseling discussion   . Goals of care, counseling/discussion   . Palliative care by specialist   . SOB (shortness of breath) 05/18/2019  . Acute on chronic respiratory failure ([place]) 05/07/2019  . Community acquired pneumonia, bilateral 04/11/2019  . Sepsis due to pneumonia ([place]) 03/21/2019  . Hypertensive urgency 03/21/2019  . Non-small cell lung cancer ([place]) 03/21/2019  . Leukocytosis 03/21/2019  . Pain due to onychomycosis of toenails of both feet 03/14/2019  . Diabetes mellitus without complication ([place]) 09/32/6712  . Chest tube in place   . Acute pneumothorax   . Pneumothorax on left   . Protein-calorie malnutrition, severe 09/06/2018  . Community acquired pneumonia of left lower lobe of lung ([place]) 09/05/2018  . Pulmonary nodule 12/03/2017  . Iron deficiency anemia   . Heme + stool   . CAD (coronary artery disease) 10/27/2017  . Microcytic anemia 10/27/2017  . Anemia due to GI blood loss 10/27/2017  . COPD with acute exacerbation ([place]) 10/27/2017  . Pulmonary nodule, left 10/15/2017  . Anemia, chronic disease 12/07/2016  . Chronic obstructive pulmonary disease ([place])   . Acute on chronic respiratory failure with hypoxia ([place])  11/19/2016  . CKD (chronic kidney disease), stage III ([place]) 11/19/2016  . Diabetes mellitus type 2, uncontrolled ([place]) 11/19/2016  . Recurrent pneumonia 11/19/2016  . Chronic diastolic CHF (congestive heart failure) ([place]) 11/19/2016  . Pulmonary HTN ([place]) 11/19/2016  . HLD (hyperlipidemia) 11/19/2016  . Hiatal hernia 05/01/2016  . History of bacterial pneumonia 05/01/2016  . COPD exacerbation ([place]) 11/18/2015  . Physical deconditioning 07/24/2013  . S/P AVR (aortic valve replacement) 07/23/2013  . S/P CABG x 1 07/23/2013  . Aortic stenosis 06/10/2013  . Essential hypertension 06/10/2013  . COPD GOLD II  10/26/2011    Orientation RESPIRATION BLADDER Height & Weight     Self, Time, Situation, Place  O2(Nasal Cannula 5L) Incontinent Weight: 75 lb 6.4 oz (34.2 kg) Height:  5' (152.4 cm)  BEHAVIORAL SYMPTOMS/MOOD NEUROLOGICAL BOWEL NUTRITION STATUS      Continent Diet(regular diet, thin liquids)  AMBULATORY STATUS COMMUNICATION OF NEEDS Skin   Limited Assist Verbally Normal                       Personal Care Assistance Level of Assistance  Bathing, Dressing, Feeding Bathing Assistance: Limited assistance Feeding assistance: Independent Dressing Assistance: Limited assistance     Functional Limitations Info  Sight, Speech, Hearing Sight Info: Adequate Hearing Info: Adequate Speech Info: Adequate    SPECIAL CARE FACTORS FREQUENCY  PT (By licensed PT), OT (By licensed OT)     PT Frequency: 5x OT Frequency: 5x            Contractures Contractures Info: Not  present    Additional Factors Info  Code Status, Allergies Code Status Info: DNR Allergies Info: Lipitor (Atorvastatin), Lisinopril, Amaryl (Glimepiride), Anoro Ellipta (Umeclidinium-vilanterol), Avelox (Moxifloxacin Hcl In Nacl), Ciprofloxacin, Other, Prevacid (Lansoprazole), Spiriva Handihaler (Tiotropium Bromide Monohydrate), Symbicort (Budesonide-formoterol Fumarate)           Current Medications  (05/08/2019):  This is the current hospital active medication list Current Facility-Administered Medications  Medication Dose Route Frequency Provider Last Rate Last Dose  . aspirin EC tablet 81 mg  81 mg Oral QHS Dana Allan I, MD   81 mg at 05/07/19 2135  . azithromycin (ZITHROMAX) tablet 250 mg  250 mg Oral Daily Shelly Coss, MD   250 mg at 05/08/19 0912  . budesonide (PULMICORT) nebulizer solution 0.25 mg  0.25 mg Nebulization BID Dana Allan I, MD   0.25 mg at 05/08/19 3532  . buPROPion (WELLBUTRIN XL) 24 hr tablet 150 mg  150 mg Oral Daily Dana Allan I, MD   150 mg at 05/08/19 9924  . cholecalciferol (VITAMIN D3) tablet 1,000 Units  1,000 Units Oral QHS Dana Allan I, MD   1,000 Units at 05/07/19 2135  . docusate sodium (COLACE) capsule 100 mg  100 mg Oral BID Dana Allan I, MD   100 mg at 05/08/19 0912  . enoxaparin (LOVENOX) injection 30 mg  30 mg Subcutaneous Q24H Dana Allan I, MD   30 mg at 05/08/19 0912  . feeding supplement (ENSURE ENLIVE) (ENSURE ENLIVE) liquid 237 mL  237 mL Oral BID BM Shelly Coss, MD   237 mL at 05/08/19 1533  . ferrous sulfate tablet 325 mg  325 mg Oral BID Dana Allan I, MD   325 mg at 05/08/19 0912  . guaiFENesin (ROBITUSSIN) 100 MG/5ML solution 100 mg  5 mL Oral Q6H Adhikari, Amrit, MD   100 mg at 05/08/19 1533  . HYDROcodone-acetaminophen (NORCO/VICODIN) 5-325 MG per tablet 1 tablet  1 tablet Oral Q6H PRN Dana Allan I, MD      . insulin aspart (novoLOG) injection 0-5 Units  0-5 Units Subcutaneous QHS Dana Allan I, MD   3 Units at 05/07/19 2217  . insulin aspart (novoLOG) injection 0-9 Units  0-9 Units Subcutaneous TID WC Dana Allan I, MD   7 Units at 05/08/19 1201  . insulin glargine (LANTUS) injection 5 Units  5 Units Subcutaneous Daily Shelly Coss, MD   5 Units at 05/08/19 1536  . ipratropium-albuterol (DUONEB) 0.5-2.5 (3) MG/3ML nebulizer solution 3 mL  3 mL Nebulization Q6H PRN  Dana Allan I, MD      . Derrill Memo ON 05/28/2019] ipratropium-albuterol (DUONEB) 0.5-2.5 (3) MG/3ML nebulizer solution 3 mL  3 mL Nebulization BID Shelly Coss, MD      . MEDLINE mouth rinse  15 mL Mouth Rinse BID Dana Allan I, MD   15 mL at 05/08/19 0916  . morphine CONCENTRATE 10 MG/0.5ML oral solution 5 mg  5 mg Sublingual Q2H PRN Earlie Counts, NP   5 mg at 05/08/19 1534  . multivitamin (PROSIGHT) tablet   Oral Daily Bonnell Public, MD   1 tablet at 05/08/19 0912  . polyethylene glycol (MIRALAX / GLYCOLAX) packet 17 g  17 g Oral Daily PRN Dana Allan I, MD      . pravastatin (PRAVACHOL) tablet 40 mg  40 mg Oral QPM Dana Allan I, MD   40 mg at 05/07/19 1719  . [START ON 28-May-2019] predniSONE (DELTASONE) tablet 40 mg  40 mg Oral Q breakfast Adhikari, Amrit,  MD      . sertraline (ZOLOFT) tablet 100 mg  100 mg Oral q morning - 10a Bonnell Public, MD   100 mg at 05/08/19 3225     Discharge Medications: Please see discharge summary for a list of discharge medications.  Relevant Imaging Results:  Relevant Lab Results:   Additional Information [id number]  Eileen Stanford, LCSW

## 2019-05-08 NOTE — Progress Notes (Signed)
Pt placed on BIPAP for desaturation. Pt sats were 85 upon RT arrival in room. Pt on 14/6 and 60% FIO2. RT will continue to monitor.

## 2019-05-08 NOTE — Care Management Important Message (Signed)
Important Message  Patient Details  Name: [name] MRN: [id number] Date of Birth: 07-08-1932   Medicare Important Message Given:  Yes     Shelda Altes 05/08/2019, 4:06 PM

## 2019-05-08 NOTE — Progress Notes (Signed)
Inpatient Diabetes Program Recommendations  AACE/ADA: New Consensus Statement on Inpatient Glycemic Control   Target Ranges:  Prepandial:   less than 140 mg/dL      Peak postprandial:   less than 180 mg/dL (1-2 hours)      Critically ill patients:  140 - 180 mg/dL   Results for DELLE, [name] (MRN [id number]) as of 05/08/2019 09:03  Ref. Range 05/07/2019 06:38 05/07/2019 10:54 05/07/2019 16:17 05/07/2019 21:04 05/08/2019 06:09  Glucose-Capillary Latest Ref Range: 70 - 99 mg/dL 196 (H) 131 (H) 506 (HH) 254 (H) 299 (H)   Review of Glycemic Control  Diabetes history: DM2 Outpatient Diabetes medications: Metformin XR 500 mg QPM Current orders for Inpatient glycemic control: Novolog 0-9 units TID with meals, Novolog 0-5 units QHS; Solumedrol 40 mg Q12H  Inpatient Diabetes Program Recommendations:   Insulin-Basal: If steroids are continued, please consider ordering Lantus 3 units Q24H (based on 34 kg x 0.1 units).  Insulin-Meal Coverage: If steroids are continued, please consider ordering Novolog 2 units TID with meals for meal coverage if patient eats at least 50% of meals.  Thanks, Barnie Alderman, RN, MSN, CDE Diabetes Coordinator Inpatient Diabetes Program [phone] (Team Pager from 8am to 5pm)

## 2019-05-08 NOTE — Evaluation (Signed)
Occupational Therapy Evaluation Patient Details Name: [name] MRN: [id number] DOB: [birth year] Today's Date: 05/08/2019    History of Present Illness Patient is 87 female with history of COPD on 2 L of oxygen at home, non-small cell lung cancer, hypertension, hyperlipidemia, CKD, diabetes mellitus, aortic stenosis, grade 1 diastolic dysfunction admitted with acute respiratory distress requiring Bipap due to COPD exacerbation   Clinical Impression   Pt reports walking with a walker and performing self care modified independently. Her husband prepares meals. Pt reports her husband receives radiation therapy, but did not know why. She presents with generalized weakness, decreased activity tolerance, poor standing balance and cognitive deficits. Recommending SNF for rehab as it does not appear she has adequate assistance at home and will follow acutely.    Follow Up Recommendations  SNF;Supervision/Assistance - 24 hour    Equipment Recommendations       Recommendations for Other Services       Precautions / Restrictions Precautions Precautions: Fall Precaution Comments: 2 recent falls at home, oxygen dependent Restrictions Weight Bearing Restrictions: No      Mobility Bed Mobility Overal bed mobility: Needs Assistance Bed Mobility: Supine to Sit     Supine to sit: Supervision       Transfers Overall transfer level: Needs assistance Equipment used: 1 person hand held assist Transfers: Sit to/from Stand;Stand Pivot Transfers Sit to Stand: Min assist Stand pivot transfers: Min assist            Balance Overall balance assessment: Needs assistance   Sitting balance-Leahy Scale: Good     Standing balance support: Single extremity supported Standing balance-Leahy Scale: Poor                             ADL either performed or assessed with clinical judgement   ADL Overall ADL's : Needs assistance/impaired Eating/Feeding: Minimal  assistance;Sitting   Grooming: Wash/dry hands;Sitting;Set up   Upper Body Bathing: Set up;Sitting   Lower Body Bathing: Minimal assistance;Sit to/from stand   Upper Body Dressing : Set up;Sitting   Lower Body Dressing: Minimal assistance;Sit to/from stand   Toilet Transfer: Minimal assistance;Stand-pivot;BSC   Toileting- Clothing Manipulation and Hygiene: Minimal assistance;Sit to/from stand               Vision Baseline Vision/History: Wears glasses Wears Glasses: At all times Patient Visual Report: No change from baseline       Perception     Praxis      Pertinent Vitals/Pain Pain Assessment: Faces Faces Pain Scale: No hurt     Hand Dominance Right   Extremity/Trunk Assessment     Lower Extremity Assessment Lower Extremity Assessment: Defer to PT evaluation   Cervical / Trunk Assessment Cervical / Trunk Assessment: Kyphotic   Communication Communication Communication: No difficulties   Cognition Arousal/Alertness: Awake/alert Behavior During Therapy: Impulsive Overall Cognitive Status: No family/caregiver present to determine baseline cognitive functioning Area of Impairment: Safety/judgement;Memory;Problem solving                     Memory: Decreased short-term memory   Safety/Judgement: Decreased awareness of safety;Decreased awareness of deficits   Problem Solving: Slow processing;Decreased initiation;Requires verbal cues     General Comments       Exercises     Shoulder Instructions      Home Living Family/patient expects to be discharged to:: Private residence Living Arrangements: Spouse/significant other Available Help at Discharge: Family;Available 24 hours/day Type  of Home: House Home Access: Stairs to enter [employer] of Steps: 3-4 Entrance Stairs-Rails: Right Home Layout: Two level;Laundry or work area in basement;Able to live on main level with bedroom/bathroom     Bathroom Shower/Tub: Arts administrator: [place]: Grab bars - tub/shower;Walker - 2 wheels;Walker - 4 wheels;Cane - single point          Prior Functioning/Environment Level of Independence: Needs assistance  Gait / Transfers Assistance Needed: spouse having to assist, two recent falls, walks with a RW ADL's / Homemaking Assistance Needed: pt can perform ADL, relies on husband for meal prep   Comments: reports her husband goes to radiation        OT Problem List: Decreased strength;Decreased activity tolerance;Impaired balance (sitting and/or standing);Decreased cognition;Decreased safety awareness      OT Treatment/Interventions: Self-care/ADL training;DME and/or AE instruction;Patient/family education;Balance training;Therapeutic activities;Cognitive remediation/compensation    OT Goals(Current goals can be found in the care plan section) Acute Rehab OT Goals Patient Stated Goal: to get stronger OT Goal Formulation: With patient Time For Goal Achievement: 05/22/19 Potential to Achieve Goals: Good ADL Goals Pt Will Perform Grooming: with supervision;standing Pt Will Perform Lower Body Bathing: with supervision;sit to/from stand Pt Will Perform Lower Body Dressing: with supervision;sit to/from stand Pt Will Transfer to Toilet: with supervision;ambulating;bedside commode Pt Will Perform Toileting - Clothing Manipulation and hygiene: with supervision;sit to/from stand Additional ADL Goal #1: Pt will use energy conservation strategies during ADL with minimal verbal cues. Additional ADL Goal #2: Pt will identify and gather items necessary for ADL with supervision.  OT Frequency: Min 2X/week   Barriers to D/C:            Co-evaluation              AM-PAC OT "6 Clicks" Daily Activity     Outcome Measure Help from another person eating meals?: A Little Help from another person taking care of personal grooming?: A Little Help from another person toileting, which  includes using toliet, bedpan, or urinal?: A Little Help from another person bathing (including washing, rinsing, drying)?: A Little Help from another person to put on and taking off regular upper body clothing?: None Help from another person to put on and taking off regular lower body clothing?: A Little 6 Click Score: 19   End of Session Equipment Utilized During Treatment: Gait belt;Oxygen(6L)  Activity Tolerance: Patient tolerated treatment well Patient left: in chair;with call bell/phone within reach  OT Visit Diagnosis: Unsteadiness on feet (R26.81);Other abnormalities of gait and mobility (R26.89);Muscle weakness (generalized) (M62.81);Other symptoms and signs involving cognitive function;History of falling (Z91.81)                Time: 3086-5784 OT Time Calculation (min): 21 min Charges:  OT General Charges $OT Visit: 1 Visit OT Evaluation $OT Eval Moderate Complexity: 1 Mod  Nestor Lewandowsky, OTR/L Acute Rehabilitation Services Pager: [phone] Office: [phone]  Malka So 05/08/2019, 12:20 PM

## 2019-05-09 ENCOUNTER — Inpatient Hospital Stay (HOSPITAL_COMMUNITY): Payer: Medicare Other

## 2019-05-09 LAB — BASIC METABOLIC PANEL
Anion gap: 16 — ABNORMAL HIGH (ref 5–15)
BUN: 103 mg/dL — ABNORMAL HIGH (ref 8–23)
CO2: 22 mmol/L (ref 22–32)
Calcium: 10 mg/dL (ref 8.9–10.3)
Chloride: 99 mmol/L (ref 98–111)
Creatinine, Ser: 2.56 mg/dL — ABNORMAL HIGH (ref 0.44–1.00)
GFR calc Af Amer: 19 mL/min — ABNORMAL LOW (ref >60)
GFR calc non Af Amer: 17 mL/min — ABNORMAL LOW (ref >60)
Glucose, Bld: 230 mg/dL — ABNORMAL HIGH (ref 70–99)
Potassium: 4.6 mmol/L (ref 3.5–5.1)
Sodium: 137 mmol/L (ref 135–145)

## 2019-05-09 LAB — BLOOD GAS, ARTERIAL
Acid-base deficit: 3.6 mmol/L — ABNORMAL HIGH (ref 0.0–2.0)
Bicarbonate: 21.7 mmol/L (ref 20.0–28.0)
Delivery systems: POSITIVE
Drawn by: 347191
Expiratory PAP: 10
FIO2: 100
Inspiratory PAP: 20
O2 Saturation: 81.7 %
Patient temperature: 97.1
RATE: 8 resp/min
pCO2 arterial: 43.1 mmHg (ref 32.0–48.0)
pH, Arterial: 7.318 — ABNORMAL LOW (ref 7.350–7.450)
pO2, Arterial: 47.6 mmHg — ABNORMAL LOW (ref 83.0–108.0)

## 2019-05-09 MED ORDER — MORPHINE SULFATE (PF) 2 MG/ML IV SOLN
INTRAVENOUS | Status: AC
  Administered 2019-05-09: 04:00:00 2 mg via INTRAMUSCULAR
  Filled 2019-05-09: qty 1

## 2019-05-09 MED ORDER — MORPHINE SULFATE (PF) 2 MG/ML IV SOLN: 2.0000 mg | Freq: Once | INTRAVENOUS | Status: AC

## 2019-05-09 MED ORDER — ENOXAPARIN SODIUM 300 MG/3ML IJ SOLN
20.0000 mg | INTRAMUSCULAR | Status: DC
  Filled 2019-05-09: qty 0.2

## 2019-05-09 MED ORDER — MORPHINE SULFATE (PF) 2 MG/ML IV SOLN: 2.0000 mg | INTRAVENOUS | Status: DC | PRN

## 2019-05-20 NOTE — Progress Notes (Signed)
Patient passed @ [phone] with family at bedside. Two RN's pronounce death per MD order, (K.Demitra Danley rnand Chauncey Cruel Newcomer RN). Daughter took wedding rings and watch and husband took clothes and dentures. Death certificate signed and handed to on coming [place] with report and she will continue to follow with post mortem care. Family is unsure at this time for funeral home.

## 2019-05-20 NOTE — Progress Notes (Signed)
Patient declining with sats tonight RT notified and ABG done, patient  is  Requiring Bipap with max assistance. Provider was notified about changes . I notified husband and daughter that patient is declining and uncertain how she will do at this time and they are welcome to come visit. Patient is lethargic will nods yes and no. Hands are cold. She does follow commands. Family arrived at bedside to visit and we continue to monitor. Patient is a DNR at this time.

## 2019-05-20 DEATH — deceased

## 2019-06-19 NOTE — Discharge Summary (Signed)
Death Summary  [name] BMW:[id number] DOB: 1932-09-03 DOA: 05-10-19  PCP: Harlan Stains, MD  Admit date: 05-10-2019 Date of Death: 05-13-19 Time of [id number]    History of present illness:   Patient is 62 female with history of COPD on 2 L of oxygen at home, non-small cell lung cancer, hypertension, hyperlipidemia, CKD, diabetes mellitus, aortic stenosis, grade 1 diastolic dysfunction who presents with 2 to 3 days history of worsening shortness of breath, increased use of inhalers.  On presentation, she was not significant respiratory distress and she had to be put on BiPAP.  ABG showed pH of 7.43, PCO2 of 30 and oxygen saturation of 90%.  BNP was minimally elevated.  She was admitted for the management of COPD exacerbation.She was admitted here and was discharged on July this year after being treated for sepsis, bilateral community-acquired pneumonia. Her respiratory status had improved after admission. She was on nasal cannula at 4 L/min.  Patient evaluated by PT and recommended skilled nursing facility on discharge.  Palliative care also following. In the early morning of May 13, 2019, patient started declining with desaturation.  She required BiPAP with maximum assistance.  Patient expired on 0641.    Final Diagnoses:  1.  Acute on chronic respiratory failure due to severe COPD.   The results of significant diagnostics from this hospitalization (including imaging, microbiology, ancillary and laboratory) are listed below for reference.    Significant Diagnostic Studies: Dg Chest Port 1 View  Result Date: 2019/05/10 CLINICAL DATA:  Shortness of breath. EXAM: PORTABLE CHEST 1 VIEW COMPARISON:  Radiograph of April 11, 2019. FINDINGS: Stable cardiomediastinal silhouette. Atherosclerosis of thoracic aorta is noted. Status post aortic valve repair. Severe emphysematous disease is noted in both lungs. Stable bibasilar atelectasis or scarring is noted. No definite pleural effusions are  noted. Bony thorax is unremarkable. IMPRESSION: Stable bibasilar subsegmental atelectasis or scarring is noted. Status post aortic valve repair. Aortic Atherosclerosis (ICD10-I70.0) and Emphysema (ICD10-J43.9). Electronically Signed   By: Marijo Conception M.D.   On: 05/10/2019 14:50    Microbiology: No results found for this or any previous visit (from the past 240 hour(s)).   Labs: Basic Metabolic Panel: No results for input(s): NA, K, CL, CO2, GLUCOSE, BUN, CREATININE, CALCIUM, MG, PHOS in the last 168 hours. Liver Function Tests: No results for input(s): AST, ALT, ALKPHOS, BILITOT, PROT, ALBUMIN in the last 168 hours. No results for input(s): LIPASE, AMYLASE in the last 168 hours. No results for input(s): AMMONIA in the last 168 hours. CBC: No results for input(s): WBC, NEUTROABS, HGB, HCT, MCV, PLT in the last 168 hours. Cardiac Enzymes: No results for input(s): CKTOTAL, CKMB, CKMBINDEX, TROPONINI in the last 168 hours. D-Dimer No results for input(s): DDIMER in the last 72 hours. BNP: Invalid input(s): POCBNP CBG: No results for input(s): GLUCAP in the last 168 hours. Anemia work up No results for input(s): VITAMINB12, FOLATE, FERRITIN, TIBC, IRON, RETICCTPCT in the last 72 hours. Urinalysis    Component Value Date/Time   COLORURINE YELLOW 05/10/2019 2104   APPEARANCEUR CLEAR May 10, 2019 2104   LABSPEC 1.022 10-May-2019 2104   PHURINE 6.0 2019/05/10 2104   GLUCOSEU NEGATIVE 2019-05-10 2104   HGBUR NEGATIVE 05/10/19 2104   BILIRUBINUR NEGATIVE 05/10/19 2104   KETONESUR 5 (A) 05/10/19 2104   PROTEINUR >=300 (A) 2019-05-10 2104   UROBILINOGEN 0.2 07/11/2013 1416   NITRITE NEGATIVE 05/10/19 2104   LEUKOCYTESUR NEGATIVE 05-10-19 2104   Sepsis Labs Invalid input(s): PROCALCITONIN,  WBC,  LACTICIDVEN  SIGNED:  Shelly Coss, MD  [facility] 05/20/2019, 3:59 PM Pager [phone]  If 7PM-7AM, please contact night-coverage [email or web address] Password [email or web address]

## 2019-07-16 ENCOUNTER — Ambulatory Visit: Payer: Medicare Other | Admitting: Podiatry

## 2019-08-11 ENCOUNTER — Ambulatory Visit: Payer: Self-pay | Admitting: Radiation Oncology
